# Patient Record
Sex: Male | Born: 1940 | Race: White | Hispanic: No | Marital: Married | Smoking: Former smoker
Health system: Southern US, Community
[De-identification: ages and names within clinical notes are randomized; demographics above are authoritative.]

## PROBLEM LIST (undated history)

## (undated) DIAGNOSIS — I10 Essential (primary) hypertension: Secondary | ICD-10-CM

## (undated) DIAGNOSIS — G459 Transient cerebral ischemic attack, unspecified: Secondary | ICD-10-CM

## (undated) DIAGNOSIS — I2510 Atherosclerotic heart disease of native coronary artery without angina pectoris: Secondary | ICD-10-CM

## (undated) DIAGNOSIS — E119 Type 2 diabetes mellitus without complications: Secondary | ICD-10-CM

## (undated) DIAGNOSIS — I350 Nonrheumatic aortic (valve) stenosis: Secondary | ICD-10-CM

## (undated) DIAGNOSIS — N2889 Other specified disorders of kidney and ureter: Secondary | ICD-10-CM

## (undated) DIAGNOSIS — I160 Hypertensive urgency: Secondary | ICD-10-CM

## (undated) DIAGNOSIS — G4730 Sleep apnea, unspecified: Secondary | ICD-10-CM

## (undated) DIAGNOSIS — I639 Cerebral infarction, unspecified: Secondary | ICD-10-CM

## (undated) DIAGNOSIS — R001 Bradycardia, unspecified: Secondary | ICD-10-CM

## (undated) DIAGNOSIS — F419 Anxiety disorder, unspecified: Secondary | ICD-10-CM

## (undated) DIAGNOSIS — R918 Other nonspecific abnormal finding of lung field: Secondary | ICD-10-CM

## (undated) DIAGNOSIS — I493 Ventricular premature depolarization: Secondary | ICD-10-CM

## (undated) DIAGNOSIS — C029 Malignant neoplasm of tongue, unspecified: Secondary | ICD-10-CM

## (undated) DIAGNOSIS — I5032 Chronic diastolic (congestive) heart failure: Secondary | ICD-10-CM

## (undated) DIAGNOSIS — E785 Hyperlipidemia, unspecified: Secondary | ICD-10-CM

## (undated) HISTORY — DX: Atherosclerotic heart disease of native coronary artery without angina pectoris: I25.10

## (undated) HISTORY — DX: Sleep apnea, unspecified: G47.30

## (undated) HISTORY — DX: Ventricular premature depolarization: I49.3

## (undated) HISTORY — DX: Essential (primary) hypertension: I10

## (undated) HISTORY — DX: Type 2 diabetes mellitus without complications: E11.9

## (undated) HISTORY — DX: Nonrheumatic aortic (valve) stenosis: I35.0

## (undated) HISTORY — DX: Hypertensive urgency: I16.0

## (undated) HISTORY — DX: Hyperlipidemia, unspecified: E78.5

## (undated) HISTORY — DX: Bradycardia, unspecified: R00.1

## (undated) HISTORY — PX: SQUAMOUS CELL CARCINOMA EXCISION: SHX2433

## (undated) HISTORY — DX: Malignant neoplasm of tongue, unspecified: C02.9

## (undated) HISTORY — DX: Cerebral infarction, unspecified: I63.9

## (undated) HISTORY — PX: CATARACT EXTRACTION, BILATERAL: SHX1313

## (undated) HISTORY — DX: Anxiety disorder, unspecified: F41.9

## (undated) NOTE — *Deleted (*Deleted)
PROGRESS NOTE    [place]  UJW:[id number] DOB: September 15, 1940 DOA: 12/17/2019 PCP: Gaspar Garbe, MD  Outpatient Specialists:     Brief Narrative:  ***   Assessment & Plan:   Active Problems:   Labile hypertension   Hyperlipidemia LDL goal <70   CAD (coronary artery disease)   Chronic diastolic CHF (congestive heart failure) (HCC)   Diabetes mellitus type 2 in nonobese (HCC)   Renal mass   S/P TAVR (transcatheter aortic valve replacement)   Chest pain with high risk for cardiac etiology   Chest pain at rest   ***   DVT prophylaxis: *** Code Status: *** Family Communication: *** Disposition Plan: ***   Consultants:   ***  Procedures:   ***  Antimicrobials:   ***    Subjective: ***  Objective: Vitals:   12/18/19 0900 12/18/19 0930 12/18/19 1000 12/18/19 1030  BP: (!) 144/62 138/62 (!) 129/59 130/60  Pulse: (!) 56 61 (!) 52 (!) 49  Resp: 16 (!) 9 14 15   Temp:      TempSrc:      SpO2: 97% 99% 99% 99%  Weight:      Height:        Intake/Output Summary (Last 24 hours) at 12/18/2019 1100 Last data filed at 12/18/2019 0042 Gross per 24 hour  Intake -  Output 2000 ml  Net -2000 ml   Filed Weights   12/17/19 1821  Weight: 78.7 kg    Examination:  General exam: Appears calm and comfortable  Respiratory system: Clear to auscultation. Respiratory effort normal. Cardiovascular system: S1 & S2 heard, RRR. No JVD, murmurs, rubs, gallops or clicks. No pedal edema. Gastrointestinal system: Abdomen is nondistended, soft and nontender. No organomegaly or masses felt. Normal bowel sounds heard. Central nervous system: Alert and oriented. No focal neurological deficits. Extremities: Symmetric 5 x 5 power. Skin: No rashes, lesions or ulcers Psychiatry: Judgement and insight appear normal. Mood & affect appropriate.     Data Reviewed: I have personally reviewed following labs and imaging studies  CBC: Recent Labs  Lab 12/17/19 1850  12/18/19 0014  WBC 7.7 8.6  HGB 14.4 14.6  HCT 44.0 43.6  MCV 81.6 80.6  PLT 142* 153   Basic Metabolic Panel: Recent Labs  Lab 12/17/19 1850 12/18/19 0014 12/18/19 0144  NA 129*  --  131*  K 3.6  --  3.5  CL 94*  --  96*  CO2 22  --  24  GLUCOSE 143*  --  214*  BUN 25*  --  20  CREATININE 1.24 1.15 1.16  CALCIUM 9.8  --  9.6   GFR: Estimated Creatinine Clearance: 54.2 mL/min (by C-G formula based on SCr of 1.16 mg/dL). Liver Function Tests: No results for input(s): AST, ALT, ALKPHOS, BILITOT, PROT, ALBUMIN in the last 168 hours. No results for input(s): LIPASE, AMYLASE in the last 168 hours. No results for input(s): AMMONIA in the last 168 hours. Coagulation Profile: No results for input(s): INR, PROTIME in the last 168 hours. Cardiac Enzymes: No results for input(s): CKTOTAL, CKMB, CKMBINDEX, TROPONINI in the last 168 hours. BNP (last 3 results) No results for input(s): PROBNP in the last 8760 hours. HbA1C: No results for input(s): HGBA1C in the last 72 hours. CBG: Recent Labs  Lab 12/18/19 0023 12/18/19 0354 12/18/19 0734 12/18/19 0821  GLUCAP 183* 191* 75 70   Lipid Profile: No results for input(s): CHOL, HDL, LDLCALC, TRIG, CHOLHDL, LDLDIRECT in the last 72 hours. Thyroid Function  Tests: No results for input(s): TSH, T4TOTAL, FREET4, T3FREE, THYROIDAB in the last 72 hours. Anemia Panel: No results for input(s): VITAMINB12, FOLATE, FERRITIN, TIBC, IRON, RETICCTPCT in the last 72 hours. Urine analysis:    Component Value Date/Time   COLORURINE AMBER (A) 10/20/2019 1113   APPEARANCEUR CLOUDY (A) 10/20/2019 1113   LABSPEC 1.010 10/20/2019 1113   PHURINE 6.0 10/20/2019 1113   GLUCOSEU NEGATIVE 10/20/2019 1113   HGBUR MODERATE (A) 10/20/2019 1113   BILIRUBINUR NEGATIVE 10/20/2019 1113   KETONESUR NEGATIVE 10/20/2019 1113   PROTEINUR 30 (A) 10/20/2019 1113   NITRITE POSITIVE (A) 10/20/2019 1113   LEUKOCYTESUR LARGE (A) 10/20/2019 1113   Sepsis Labs:  @LABRCNTIP (procalcitonin:4,lacticidven:4)  ) Recent Results (from the past 240 hour(s))  Respiratory Panel by RT PCR (Flu A&B, Covid) - Nasopharyngeal Swab     Status: None   Collection Time: 12/17/19 11:11 PM   Specimen: Nasopharyngeal Swab  Result Value Ref Range Status   SARS Coronavirus 2 by RT PCR NEGATIVE NEGATIVE Final    Comment: (NOTE) SARS-CoV-2 target nucleic acids are NOT DETECTED.  The SARS-CoV-2 RNA is generally detectable in upper respiratoy specimens during the acute phase of infection. The lowest concentration of SARS-CoV-2 viral copies this assay can detect is 131 copies/mL. A negative result does not preclude SARS-Cov-2 infection and should not be used as the sole basis for treatment or other patient management decisions. A negative result may occur with  improper specimen collection/handling, submission of specimen other than nasopharyngeal swab, presence of viral mutation(s) within the areas targeted by this assay, and inadequate number of viral copies (<131 copies/mL). A negative result must be combined with clinical observations, patient history, and epidemiological information. The expected result is Negative.  Fact Sheet for Patients:  https://www.moore.com/  Fact Sheet for Healthcare Providers:  https://www.young.biz/  This test is no t yet approved or cleared by the [place] FDA and  has been authorized for detection and/or diagnosis of SARS-CoV-2 by FDA under an Emergency Use Authorization (EUA). This EUA will remain  in effect (meaning this test can be used) for the duration of the COVID-19 declaration under Section 564(b)(1) of the Act, 21 U.S.C. section 360bbb-3(b)(1), unless the authorization is terminated or revoked sooner.     Influenza A by PCR NEGATIVE NEGATIVE Final   Influenza B by PCR NEGATIVE NEGATIVE Final    Comment: (NOTE) The Xpert Xpress SARS-CoV-2/FLU/RSV assay is intended as an aid in   the diagnosis of influenza from Nasopharyngeal swab specimens and  should not be used as a sole basis for treatment. Nasal washings and  aspirates are unacceptable for Xpert Xpress SARS-CoV-2/FLU/RSV  testing.  Fact Sheet for Patients: https://www.moore.com/  Fact Sheet for Healthcare Providers: https://www.young.biz/  This test is not yet approved or cleared by the [place] FDA and  has been authorized for detection and/or diagnosis of SARS-CoV-2 by  FDA under an Emergency Use Authorization (EUA). This EUA will remain  in effect (meaning this test can be used) for the duration of the  Covid-19 declaration under Section 564(b)(1) of the Act, 21  U.S.C. section 360bbb-3(b)(1), unless the authorization is  terminated or revoked. Performed at [facility], [address], [place], [place] [address]          Radiology Studies: DG Chest 2 View  Result Date: 12/17/2019 CLINICAL DATA:  Chest pain on the left EXAM: CHEST - 2 VIEW COMPARISON:  12/01/2019 FINDINGS: The heart size and mediastinal contours are within normal limits.  Both lungs are clear. The visualized skeletal structures are unremarkable. IMPRESSION: No active cardiopulmonary disease. Electronically Signed   By: Alcide Clever M.D.   On: 12/17/2019 20:00        Scheduled Meds: . aspirin  81 mg Oral Daily  . clonazePAM  0.5 mg Oral BID  . fenofibrate  160 mg Oral Daily  . finasteride  5 mg Oral Daily  . hydrALAZINE  25 mg Oral Q8H  . losartan  100 mg Oral Daily   And  . hydrochlorothiazide  25 mg Oral Daily  . insulin aspart  0-15 Units Subcutaneous Q4H  . isosorbide mononitrate  30 mg Oral Daily  . metoprolol tartrate  50 mg Oral BID  . ranolazine  500 mg Oral BID  . rosuvastatin  10 mg Oral Daily   Continuous Infusions:   LOS: 0 days    Time spent: ***    Berton Mount, MD  [facility] Pager #: [phone] 7PM-7AM contact night coverage  as above

---

## 1998-04-15 ENCOUNTER — Other Ambulatory Visit: Admission: RE | Admit: 1998-04-15 | Discharge: 1998-04-15 | Payer: Self-pay | Admitting: Dermatology

## 1999-12-04 ENCOUNTER — Ambulatory Visit (HOSPITAL_COMMUNITY): Admission: RE | Admit: 1999-12-04 | Discharge: 1999-12-04 | Payer: Self-pay | Admitting: Gastroenterology

## 2002-08-20 ENCOUNTER — Encounter: Payer: Self-pay | Admitting: Emergency Medicine

## 2002-08-20 ENCOUNTER — Inpatient Hospital Stay (HOSPITAL_COMMUNITY): Admission: EM | Admit: 2002-08-20 | Discharge: 2002-08-23 | Payer: Self-pay | Admitting: Emergency Medicine

## 2002-08-20 ENCOUNTER — Encounter: Payer: Self-pay | Admitting: Neurology

## 2002-08-21 ENCOUNTER — Encounter (INDEPENDENT_AMBULATORY_CARE_PROVIDER_SITE_OTHER): Payer: Self-pay | Admitting: Interventional Cardiology

## 2002-08-21 ENCOUNTER — Encounter: Payer: Self-pay | Admitting: Neurology

## 2003-04-17 ENCOUNTER — Ambulatory Visit (HOSPITAL_COMMUNITY): Admission: RE | Admit: 2003-04-17 | Discharge: 2003-04-17 | Payer: Self-pay | Admitting: Neurology

## 2004-02-14 ENCOUNTER — Ambulatory Visit (HOSPITAL_COMMUNITY): Admission: RE | Admit: 2004-02-14 | Discharge: 2004-02-14 | Payer: Self-pay | Admitting: Neurology

## 2008-04-26 ENCOUNTER — Encounter: Admission: RE | Admit: 2008-04-26 | Discharge: 2008-04-26 | Payer: Self-pay | Admitting: Neurology

## 2009-01-20 ENCOUNTER — Emergency Department (HOSPITAL_COMMUNITY): Admission: EM | Admit: 2009-01-20 | Discharge: 2009-01-20 | Payer: Self-pay | Admitting: Emergency Medicine

## 2009-10-07 ENCOUNTER — Ambulatory Visit: Payer: Self-pay | Admitting: Cardiovascular Disease

## 2009-11-11 ENCOUNTER — Ambulatory Visit: Payer: Self-pay | Admitting: Cardiovascular Disease

## 2010-02-07 ENCOUNTER — Ambulatory Visit: Payer: Self-pay | Admitting: Cardiovascular Disease

## 2010-06-04 LAB — POCT I-STAT, CHEM 8
BUN: 21 mg/dL (ref 6–23)
Calcium, Ion: 1.13 mmol/L (ref 1.12–1.32)
Chloride: 106 mEq/L (ref 96–112)
Creatinine, Ser: 1.1 mg/dL (ref 0.4–1.5)
Glucose, Bld: 139 mg/dL — ABNORMAL HIGH (ref 70–99)
HCT: 48 % (ref 39.0–52.0)
Hemoglobin: 16.3 g/dL (ref 13.0–17.0)
Potassium: 4.2 mEq/L (ref 3.5–5.1)
Sodium: 143 mEq/L (ref 135–145)
TCO2: 28 mmol/L (ref 0–100)

## 2010-06-25 ENCOUNTER — Encounter: Payer: Self-pay | Admitting: Cardiovascular Disease

## 2010-06-25 DIAGNOSIS — I493 Ventricular premature depolarization: Secondary | ICD-10-CM | POA: Insufficient documentation

## 2010-06-25 DIAGNOSIS — I10 Essential (primary) hypertension: Secondary | ICD-10-CM | POA: Insufficient documentation

## 2010-06-25 DIAGNOSIS — R001 Bradycardia, unspecified: Secondary | ICD-10-CM | POA: Insufficient documentation

## 2010-06-25 DIAGNOSIS — R0989 Other specified symptoms and signs involving the circulatory and respiratory systems: Secondary | ICD-10-CM | POA: Insufficient documentation

## 2010-06-25 DIAGNOSIS — F419 Anxiety disorder, unspecified: Secondary | ICD-10-CM | POA: Insufficient documentation

## 2010-06-25 DIAGNOSIS — E785 Hyperlipidemia, unspecified: Secondary | ICD-10-CM | POA: Insufficient documentation

## 2010-07-01 ENCOUNTER — Ambulatory Visit (INDEPENDENT_AMBULATORY_CARE_PROVIDER_SITE_OTHER): Payer: Medicare Other | Admitting: Cardiovascular Disease

## 2010-07-01 ENCOUNTER — Encounter: Payer: Self-pay | Admitting: Cardiovascular Disease

## 2010-07-01 VITALS — BP 160/90 | HR 64 | Wt 227.0 lb

## 2010-07-01 DIAGNOSIS — I10 Essential (primary) hypertension: Secondary | ICD-10-CM

## 2010-07-01 MED ORDER — NEBIVOLOL HCL 20 MG PO TABS: 40.0000 mg | ORAL_TABLET | Freq: Every day | ORAL | Status: DC

## 2010-07-01 NOTE — Assessment & Plan Note (Signed)
The blood pressure has been on him but it is a little bit elevated here. I like to increase his Bystolic  To 40 mg a day. He should stop taking the Toprol.  I'll see him back in the office in 3 months for followup visit.

## 2010-07-01 NOTE — Progress Notes (Signed)
Alexis Griffith Date of Birth  02-19-1941 [facility] [address]     [address] [place], [place]  [address] [phone]  Fax  [phone]  History of Present Illness:  Pt denies chest pain or dyspnea.  BP has been normal at home.  He still does not take his medications as prescribed. He takes Bystolic 10 mg a day and then takes her Toprol on an as-needed basis on top of that.  His blood pressure seemed be fairly well controlled. Occasionally has some palpitations.   Current Outpatient Prescriptions on File Prior to Visit  Medication Sig Dispense Refill  . buPROPion (WELLBUTRIN SR) 150 MG 12 hr tablet Take 150 mg by mouth daily.        . Choline Fenofibrate (TRILIPIX) 135 MG capsule Take 135 mg by mouth daily.        . clonazePAM (KLONOPIN) 0.5 MG tablet Take 0.5 mg by mouth 4 (four) times daily.        Marland Kitchen dipyridamole-aspirin (AGGRENOX) 25-200 MG per 12 hr capsule Take 1 capsule by mouth 2 (two) times daily.        Marland Kitchen doxazosin (CARDURA) 4 MG tablet Take 4 mg by mouth at bedtime.        Marland Kitchen glimepiride (AMARYL) 4 MG tablet Take 4 mg by mouth daily before breakfast.        . metoprolol (LOPRESSOR) 50 MG tablet Take 50 mg by mouth 2 (two) times daily.        . rosuvastatin (CRESTOR) 10 MG tablet Take 10 mg by mouth daily.          Allergies  Allergen Reactions  . Lisinopril   . Macrodantin   . Penicillins   . Sulfa Drugs Cross Reactors   . Tricor   . Zithromax (Azithromycin Dihydrate)     Past Medical History  Diagnosis Date  . PVC (premature ventricular contraction)   . Hypertension   . Hyperlipidemia   . Anxiety   . Sinus bradycardia on ECG     History reviewed. No pertinent past surgical history.  History  Smoking status  . Former Smoker  . Quit date: 06/25/1975  Smokeless tobacco  . Not on file    History  Alcohol Use No    History reviewed. No pertinent family history.  Reviw of Systems:  Reviewed in the HPI.  All  other systems are negative.  Physical Exam: BP 160/90  Pulse 64  Wt 227 lb (102.967 kg) The patient is alert and oriented x 3.  The mood and affect are normal.  The skin is warm and dry.  Color is normal.  The HEENT exam reveals that the sclera are nonicteric.  The mucous membranes are moist.  The carotids are 2+ without bruits.  There is no thyromegaly.  There is no JVD.  The lungs are clear.  The chest wall is non tender.  The heart exam reveals a regular rate with a normal S1 and S2.  There are no murmurs, gallops, or rubs.  The PMI is not displaced.   Abdominal exam reveals good bowel sounds.  There is no guarding or rebound.  There is no hepatosplenomegaly or tenderness.  There are no masses.  Exam of the legs reveal no clubbing, cyanosis, or edema.  The legs are without rashes.  The distal pulses are intact.  Cranial nerves II - XII are intact.  Motor and sensory functions are intact.  The gait is  normal.  Assessment / Plan:

## 2010-07-09 ENCOUNTER — Other Ambulatory Visit: Payer: Self-pay | Admitting: Neurology

## 2010-07-09 DIAGNOSIS — G459 Transient cerebral ischemic attack, unspecified: Secondary | ICD-10-CM

## 2010-07-09 DIAGNOSIS — I6529 Occlusion and stenosis of unspecified carotid artery: Secondary | ICD-10-CM

## 2010-07-14 ENCOUNTER — Ambulatory Visit
Admission: RE | Admit: 2010-07-14 | Discharge: 2010-07-14 | Disposition: A | Discharge status: Home / Self Care | Payer: Medicare Other | Source: Ambulatory Visit | Attending: Neurology | Admitting: Neurology

## 2010-07-14 DIAGNOSIS — G459 Transient cerebral ischemic attack, unspecified: Secondary | ICD-10-CM

## 2010-07-14 DIAGNOSIS — I6529 Occlusion and stenosis of unspecified carotid artery: Secondary | ICD-10-CM

## 2010-07-14 MED ORDER — IOHEXOL 350 MG/ML SOLN
100.0000 mL | Freq: Once | INTRAVENOUS | Status: AC | PRN
  Administered 2010-07-14: 100 mL via INTRAVENOUS

## 2010-07-18 NOTE — Discharge Summary (Signed)
[place]is [place], [place]is [place]                         ACCOUNT NO.:  [id number]   MEDICAL RECORD NO.:  [id number]                   PATIENT TYPE:  INP   LOCATION:  3015                                 FACILITY:  [facility]   PHYSICIAN:  Genene Churn. Love, M.D.                 DATE OF BIRTH:  Sep 24, 1940   DATE OF ADMISSION:  08/20/2002  DATE OF DISCHARGE:  08/23/2002                                 DISCHARGE SUMMARY   INTRODUCTION:  This is the first [facility] admission for this 70-  year-old, right-handed white married male admitted from the emergency room  for evaluation of gait disorder.   HISTORY OF PRESENT ILLNESS:  Alexis Griffith has a past history of hypertension  and borderline diabetes mellitus.  He came to the emergency room for  evaluation of intermittent episodes of gait disturbance with sensation of  falling to the right beginning 24 hours prior to admission.  At that time he  had used a push mower to mow his grass rather than the typical riding mower  that he would use.  When attempting to walk he noticed episodes in which he  would tend to veer to his right but would not fall.  This was not associated  with any spinning sensations, headache, double vision, or hiccups.  In the  morning he got out of bed, the same symptoms happened, and he came to the  emergency room.   PAST MEDICAL HISTORY:  Significant for:  1. Hypertension.  2. Gait disorder.  3. Borderline diabetes mellitus.  4. Hyperlipidemia with elevated triglycerides.  5. Benign prostatic hypertrophy.  6. Anxiety and depression.  7. Multiple reactions to medications.   SOCIAL HISTORY:  He did not smoke cigarettes or drink alcohol.   ALLERGIES:  PENICILLIN, SULFA DRUGS, and MACRODANTIN.   MEDICATIONS AT THE TIME OF ADMISSION:  1. Tricor 160 mg daily.  2. Lopressor 50 mg b.i.d.  3. Cardura 4 mg daily.  4. Wellbutrin SR 150 mg daily.  5. Klonopin 0.5 mg q.i.d.  6. Ecotrin 325 mg daily.   SOCIAL HISTORY:  The  patient was married.  He lived in [place]  He is  semiretired, has a Civil engineer, contracting.   PHYSICAL EXAMINATION:  GENERAL:  At the time of admission revealed a well-  developed, pleasant white male in no acute distress.  General examination  was unremarkable.  VITAL SIGNS:  Blood pressure 137/79, heart rate 66 and regular, respiratory  rate was 14, temperature afebrile.  NEUROLOGIC:  Unremarkable.   LABORATORY DATA:  CT scan of the brain was unremarkable.   EKG showed normal sinus rhythm with nonspecific ST and T-wave changes.  Heart rate was 65.   Chest x-ray showed no active disease.   Sodium 139, potassium 3.9, chloride 106, CO2 content 26, BUN 13.  Hematocrit  50, hemoglobin 17.  His repeat CBCs in the hospital revealed white  blood  cell counts in the 7300 range, hemoglobin 15.5, hematocrit 44.3.  While on  heparin PTTs were in the 137-200 range.  His glucose was high at 148, BUN  13, creatinine 1.  Cholesterol was 198, triglycerides 235, HDLs 34, LDLs  117, VLDLs of 47.  His homocystine level was 13.28 with upper limits of  normal at 13.90.   Telemetry showed normal sinus rhythm in the hospital.   MRI study of the brain showed no evidence of an acute stroke.  There was  evidence of an acute right vertebral occlusion in the distal portion of the  intracranial branch with proximal vertebral showing some disease raising the  question of dissection versus atherosclerosis as a cause for occlusion.  There was possibly some proximal stenosis of the left vertebral as it came  off the subclavian.  There was some stenosis of the right ICA and possibly  ulceration by MR angiogram.   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   His 2-D echocardiogram showed left ventricular ejection fraction of 55-65%.   HOSPITAL COURSE:  The patient was admitted with suspected TIAs, probably  posterior circulation, and when found to have evidence of the right  vertebral distal  occlusion, suspected to have had that as the cause.  It was  noted that this was a nondominant vertebral.  The dominant vertebral, the  left, did have some mild proximal disease noted.  There was also some right  ICA stenosis documented by extracranial MRA and Doppler study.  In the  hospital discussion with the patient regarding his triglycerides and LDLs  was made, and it was decided to consider the addition of Zetia, which he had  been on in the past, to his current regimen but to be careful because of the  consideration of combination medicines sometimes associated with gallstones.   ASSESSMENT:  1. Gait disorder, code 781.2.  2. Suspect transient ischemic attack, code 435.9.  3. 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  4. Hyperlipidemia and hypertriglyceridemia, code 272.4.  5. Diabetes mellitus, code 250.60.  6. Benign prostatic hypertrophy, code unknown.  7. Anxiety, code 300.00.   PLAN:  1. The plan at this time is to place the patient on Plavix 75 mg daily     without associated aspirin.  If  further symptoms arise, then consideration of neurologic intervention may be  appropriate.  1. Continue Wellbutrin 150 mg SR daily.  2. Klonopin 0.5 mg four times per day.  3. Cardura 4 mg at bedtime.  4. Tricor 160 mg daily.  5. Lopressor 50 mg morning and evening.  6. Zetia 10 mg tablets, 1/2 daily.  7. Plavix 75 mg daily.  8. Nexium 40 mg daily.   He is not to drive a car.  He is to be on a low-salt, low-cholesterol,  diabetic diet.  Return to see me in approximately three weeks for  reevaluation and to see Dr. Wylene Simmer.                                               Genene Churn. Sandria Manly, M.D.    JML/MEDQ  D:  08/23/2002  T:  08/24/2002  Job:  [id number]  cc:   Gaspar Garbe, M.D. [address]   [place]  [place] [address]  Fax: [phone]    cc:   Gaspar Garbe, M.D.  [address]  [place]  [place] [address]  Fax: [phone]

## 2010-07-18 NOTE — H&P (Signed)
NAME:  Alexis Griffith                         ACCOUNT NO.:  [id number]   MEDICAL RECORD NO.:  [id number]                   PATIENT TYPE:  EMS   LOCATION:  MAJO                                 FACILITY:  [facility]   PHYSICIAN:  Marlan Palau, M.D.               DATE OF BIRTH:  20-Jun-1940   DATE OF ADMISSION:  08/20/2002  DATE OF DISCHARGE:                                HISTORY & PHYSICAL   HISTORY OF PRESENT ILLNESS:  Alexis Griffith is  a 70 year old right-handed  white male born on November 10, 1940, with a history of  hypertension and  borderline diabetes. The patient comes to the [facility] emergency  room for evaluation  of intermittent  episodes of gait disturbance that  began yesterday after he mowed his grass. The patient noted that he would  attempt to walk and would tend to veer to the right but would not fall. The  patient  initially had an episode lasting about an hour and a half with full  clearing.  This morning, however, when the patient got up out of bed, the same thing  would happen. The patient noted that he would veer to the right while he was  trying to walk. The patient denied any numbness or weakness on the face,  arms, legs, slurred speech or visual field changes, but felt unusual in  the head. The patient again has not fallen. The patient has had 2 to 3  episodes of gait disturbance with full resolution. The patient comes to the  emergency room for an evaluation.   PAST MEDICAL HISTORY:  1. History of hypertension.  2. Gait disturbance as above.  3. Borderline diabetes.  4. Hypercholesteremia, hyperlipidemia.  5. Benign prostatic hypertrophy.  6. Anxiety and depression.   MEDICATIONS:  1. TriCor 160 mg daily.  2. Lopressor 150 mg b.i.d.  3. Cardura 4 mg daily.  4. Wellbutrin SR 150 mg daily.  5. Klonopin 0.5 mg q.i.d.  6. Ecotrin 325 mg daily.   ALLERGIES:  PENICILLIN, SULFA DRUGS AND MACRODANTIN.   HABITS:  He does not smoke or drink.   SOCIAL HISTORY:  The patient is married. He lives in the [place] area. He  has 2 children who are alive and well. The patient is in semi retirement.  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   FAMILY HISTORY:  His mother has died with a stroke. His father had MI and a  stroke. The patient has 2 brothers, 1 with coronary artery disease, status  post stent placement.   REVIEW OF SYSTEMS:  Notable for no fevers or chills. The patient denies  headache. Slight neck stiffness. Denies  shortness of breath, chest pain,  nausea or vomiting, problems controlling bowels or bladder. He denies any  blackout episodes or dizziness.   PHYSICAL EXAMINATION:  VITAL SIGNS:  Blood pressure 137/79, heart rate 66,  respirations 14, temperature  afebrile.  GENERAL:  This patient is a well developed white male who is alert and  cooperative at the time of examination.  HEENT:  Head is atraumatic. Pupils are equal, round and reactive to light.  Disks are flat bilaterally.  NECK:  No carotid bruits.  RESPIRATORY:  Examination  is clear.  CARDIOVASCULAR:  Regular rate and rhythm with no obvious murmurs or rubs  noted.  ABDOMEN:  Mildly obese abdomen, positive bowel sounds, no organomegaly or  tenderness noted.  EXTREMITIES:  Without significant edema.  NEUROLOGIC:  Cranial nerves, facial symmetry is present. The patient has  good sensation  in the face to pinprick and soft bilaterally. He has good  strength  in the facial muscles and muscles of the neck  and shoulder shrug  bilaterally. Speech is well enunciated and not aphasic. The patient has good  strength  in all 4s, good symmetric  motor tone noted throughout. Sensory  testing is intact to pinprick, soft touch and vibratory sensation  throughout. The patient has good finger-nose-finger and heel-to-shin. Gait  normal. Tandem gait normal. Romberg negative. No evidence of pronator drift  is seen. Deep tendon reflexes remained symmetric and  normal. Toes are  downgoing bilaterally.   LABORATORY DATA:  A CT scan of the head is unremarkable.  An EKG reveals  normal sinus rhythm with nonspecific  T-wave abnormalities. Heart rate is  65. A chest x-ray is pending.   Sodium 139, potassium 3.9, chloride 106, bicarbonate 26, BUN 13, hematocrit  50, hemoglobin 17.   IMPRESSION:  Intermittent gait disturbance, etiology unclear, rule out  transient ischemic attack event. The patient has had several episodes of  gait disturbance with a tendency to veer off to the right with walking. We  need to assume that these events represent TIA spells. Will admit for  further evaluation.   PLAN:  1. Admission to [facility]  2. Intravenous heparin.  3. MRI scan of the brain.  4. MRI angiogram.  5. 2D echocardiogram.  6. I will follow the patient's clinical course while in house.                                               Marlan Palau, M.D.    CKW/MEDQ  D:  08/20/2002  T:  08/21/2002  Job:  [id number]   cc:   Gwen Pounds, M.D.  [address]  [place]  [place] [address]  Fax: [phone]   [facility]    cc:   Gwen Pounds, M.D.  [address]  [place]  [place] [address]  Fax: [phone]   [facility]

## 2010-07-18 NOTE — Procedures (Signed)
[place]  Patient:    DESTON, BILYEU                      MRN: [id number] Proc. Date: 12/04/99 Adm. Date:  [id number] Attending:  Orland Mustard CC:         Jonelle Sports. Cheryll Cockayne, M.D.   Procedure Report  PROCEDURE:  Colonoscopy.  MEDICATIONS:  Fentanyl 150 mcg, Versed 15 mg IV.  INDICATIONS:  A nice gentleman with a previous history of adenomatous colon polyps.  This is done as a 3 year follow up.  DESCRIPTION OF PROCEDURE:  The procedure had been explained to the patient and consent obtained.  SCOPE:  Olympus adult video colonoscope.  With the patient in the left lateral decubitus, the Olympus adult video colonoscope was inserted and advanced under direct visualization.  The prep was quite good.  The patient had a long tortuous colon and it took some time to advance to the cecum.  Abdominal pressure and position changes were required.  The cecum was reached, ileocecal valve was seen and there was some sticky adherent stool in the cecum where a small polyp could have been missed but nothing of any significance.  The scope was withdrawn.  Cecum, ascending colon, hepatic flexure, transverse colon, splenic flexure, descending and sigmoid colon were seen well.  The scope was withdrawn.  The patient tolerated the procedure well.  ASSESSMENT:  No evidence of further polyps.  PLAN:  Repeat in 3 years. DD:  12/04/99 TD:  12/04/99 Job: [id number]

## 2010-08-05 ENCOUNTER — Encounter (INDEPENDENT_AMBULATORY_CARE_PROVIDER_SITE_OTHER): Payer: Medicare Other | Admitting: Vascular Surgery

## 2010-08-05 DIAGNOSIS — I6529 Occlusion and stenosis of unspecified carotid artery: Secondary | ICD-10-CM

## 2010-08-06 NOTE — Consult Note (Signed)
NEW PATIENT CONSULTATION  Alexis Griffith, Alexis Griffith DOB:  03-30-1940                                       08/05/2010 ZOXWR#:[id number]  Alexis Griffith presents today for evaluation of his  extracranial cerebrovascular occlusive disease.  He is a very pleasant 70 year old gentleman who had a history of stroke or TIA in June 2004.  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  He has had  longstanding ongoing follow-up of his extracranial cerebrovascular occlusive disease.  We are seeing him for further discussion of this. Aside from this one event in 2004, he has had no recurrent episodes of transient ischemic attacks, amaurosis fugax or stroke.  He does have a history of non-insulin dependent diabetes, hypertension, and elevated cholesterol.  SOCIAL HISTORY:  He is married and is retired.  He quit smoking 35 years ago.  He does not drink alcohol.  FAMILY HISTORY:  Positive for cardiac disease in his father and mother.  REVIEW OF SYSTEMS:  No weight loss or gain.  He weighs 225 pounds.  He is, 5 feet 10 inches tall. VASCULAR:  Positive a mini stroke. CARDIAC:  Positive for atrial fibrillation. GI:  Reflux, urinary frequency. ENT:  Change in eyesight. MUSCULOSKELETAL:  Arthritis, joint pain and muscle pain. PSYCHIATRIC:  Anxiety and depression.  PHYSICAL EXAMINATION:  Well-developed, well-nourished white male in no acute distress. His blood pressure is 134/69 in the right arm, 134/77 in the left arm, heart rate 64, respirations 18. HEENT:  Normal. CHEST:  Clear bilaterally. HEART:  Regular rate and rhythm.  He has  2+ carotid pulses.  I do not hear any carotid bruits.  His radial pulses are 2+. He is grossly intact neurologically with no focal weakness or paresthesias. MUSCULOSKELETAL:  Shows no major deformities or cyanosis.  I reviewed his carotid duplex from [facility] and also reviewed his specific images on a CT angiogram  obtained on Jul 14, 2010.  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  There is much less severe stenosis in the left carotid system.  I discussed this at length with Alexis Griffith and his wife present.  I would not recommend any intervention based on his current studies unless he develops any acute symptoms.  I have recommended carotid duplex at a 102-month interval.  I  explained to him I am completely comfortable this being done at [facility].  He has specifically requested that we do his duplex feeling that if he needs treatment that this would be the place that it would be received.  We are comfortable with this or at [facility] and I have scheduled him for a 21-month duplex follow- up in our lab.    Larina Earthly, M.D. Electronically Signed  TFE/MEDQ  D:  08/05/2010  T:  08/06/2010  Job:  [id number]  cc:   Alexis Griffith, M.D.

## 2010-10-02 ENCOUNTER — Encounter: Payer: Self-pay | Admitting: Cardiovascular Disease

## 2010-10-02 ENCOUNTER — Ambulatory Visit (INDEPENDENT_AMBULATORY_CARE_PROVIDER_SITE_OTHER): Payer: Medicare Other | Admitting: Cardiovascular Disease

## 2010-10-02 DIAGNOSIS — E785 Hyperlipidemia, unspecified: Secondary | ICD-10-CM

## 2010-10-02 DIAGNOSIS — I10 Essential (primary) hypertension: Secondary | ICD-10-CM

## 2010-10-02 NOTE — Assessment & Plan Note (Addendum)
His blood pressure been well controlled on the higher dose of Bystolic. He seems to be tolerating it fairly well. We'll continue with his current medications.

## 2010-10-02 NOTE — Progress Notes (Signed)
Alexis Griffith Date of Birth  1940/08/08 [facility] [address]     [address] [place], [place]  [address] [phone]  Fax  [phone]  History of Present Illness:  Alexis Griffith is a 70 year old gentleman. He has a history of premature ventricular contractions. He has a history of hypertension and hyperlipidemia. He also has had some anxiety in the past.  He's not had any recent episodes of chest pain or shortness of breath.  He occasionally has some palpitations and takes a half a dose of Lopressor which seems to solve the  Problem.  He  Is not getting as much exercise as he would like.  I saw him 3 months ago. His blood pressure was elevated. We increased his postoperative 40 mg a day. He seems to be doing quite well with that.    He has been under lots stress this week.  Current Outpatient Prescriptions on File Prior to Visit  Medication Sig Dispense Refill  . buPROPion (WELLBUTRIN SR) 150 MG 12 hr tablet Take 150 mg by mouth daily.        . Choline Fenofibrate (TRILIPIX) 135 MG capsule Take 135 mg by mouth daily.        . clonazePAM (KLONOPIN) 0.5 MG tablet Take 0.5 mg by mouth 4 (four) times daily.        Marland Kitchen dipyridamole-aspirin (AGGRENOX) 25-200 MG per 12 hr capsule Take 1 capsule by mouth 2 (two) times daily.        Marland Kitchen doxazosin (CARDURA) 4 MG tablet Take 4 mg by mouth at bedtime.        Marland Kitchen glimepiride (AMARYL) 4 MG tablet Take 4 mg by mouth daily before breakfast.        . metFORMIN (GLUCOPHAGE) 500 MG tablet Take 500 mg by mouth 2 (two) times daily with a meal.        . Nebivolol HCl (BYSTOLIC) 20 MG TABS Take 2 tablets (40 mg total) by mouth daily.  60 tablet  12  . rosuvastatin (CRESTOR) 10 MG tablet Take 10 mg by mouth daily.          Allergies  Allergen Reactions  . Lisinopril   . Macrodantin   . Penicillins   . Sulfa Drugs Cross Reactors   . Tricor   . Zithromax (Azithromycin Dihydrate)     Past Medical History  Diagnosis Date   . PVC (premature ventricular contraction)   . Hypertension   . Hyperlipidemia   . Anxiety   . Sinus bradycardia on ECG     No past surgical history on file.  History  Smoking status  . Former Smoker  . Quit date: 06/25/1975  Smokeless tobacco  . Not on file    History  Alcohol Use No    No family history on file.  Reviw of Systems:  Reviewed in the HPI.  All other systems are negative.  Physical Exam: BP 130/68  Pulse 56  Ht 5\' 10"  (1.778 m)  Wt 227 lb (102.967 kg)  BMI 32.57 kg/m2 The patient is alert and oriented x 3.  The mood and affect are normal.   Skin: warm and dry.  Color is normal.    HEENT:   the sclera are nonicteric.  The mucous membranes are moist.  The carotids are 2+ without bruits.  There is no thyromegaly.  There is no JVD.    Lungs: clear.  The chest wall is non tender.    Heart:  regular rate with a normal S1 and S2.  There are no murmurs, gallops, or rubs. The PMI is not displaced.     Abdomen: good bowel sounds.  There is no guarding or rebound.  There is no hepatosplenomegaly or tenderness.  There are no masses.   Extremities:  no clubbing, cyanosis, or edema.  The legs are without rashes.  The distal pulses are intact.   Neuro:  Cranial nerves II - XII are intact.  Motor and sensory functions are intact.    The gait is normal.  ECG:  Assessment / Plan:

## 2010-10-02 NOTE — Assessment & Plan Note (Signed)
Check a lipid profile to see him again in 6 months.

## 2010-10-14 ENCOUNTER — Other Ambulatory Visit: Payer: Self-pay | Admitting: *Deleted

## 2010-10-14 MED ORDER — METOPROLOL TARTRATE 50 MG PO TABS: 50.0000 mg | ORAL_TABLET | Freq: Two times a day (BID) | ORAL | Status: DC

## 2010-10-24 ENCOUNTER — Other Ambulatory Visit: Payer: Self-pay | Admitting: Otolaryngology

## 2010-10-24 ENCOUNTER — Telehealth: Payer: Self-pay | Admitting: Cardiovascular Disease

## 2010-10-24 DIAGNOSIS — C023 Malignant neoplasm of anterior two-thirds of tongue, part unspecified: Secondary | ICD-10-CM

## 2010-10-24 NOTE — Telephone Encounter (Signed)
Called Mandy Dr Clovis Pu nurse, msg left to call back with procedure and date.

## 2010-10-24 NOTE — Telephone Encounter (Signed)
Alexis Griffith calling in regards to a medical clearance for surgery.  Please call back.  Chart in box.

## 2010-10-28 ENCOUNTER — Ambulatory Visit
Admission: RE | Admit: 2010-10-28 | Discharge: 2010-10-28 | Disposition: A | Discharge status: Home / Self Care | Payer: Medicare Other | Source: Ambulatory Visit | Attending: Otolaryngology | Admitting: Otolaryngology

## 2010-10-28 DIAGNOSIS — C023 Malignant neoplasm of anterior two-thirds of tongue, part unspecified: Secondary | ICD-10-CM

## 2010-10-28 MED ORDER — IOHEXOL 300 MG/ML  SOLN
75.0000 mL | Freq: Once | INTRAMUSCULAR | Status: AC | PRN
  Administered 2010-10-28: 75 mL via INTRAVENOUS

## 2010-10-28 NOTE — Telephone Encounter (Signed)
No date set yet, pending clearance. Pt having general anesthesia for tumor removal in cervical area with skin grafting. Chart on dr Ian Bushman desk.

## 2010-10-28 NOTE — Telephone Encounter (Signed)
msg again, need date and procedure.

## 2010-10-29 ENCOUNTER — Telehealth: Payer: Self-pay | Admitting: Cardiovascular Disease

## 2010-10-29 NOTE — Telephone Encounter (Signed)
Pt c/o htn 170/80 160/75 and is taking an extra bystolic 20mg  tab, pulse is 50. Pt upset has new dx of tongue cancer. Dr Ian Bushman said no additional bystolic to be taken, pt to walk, or do other activity to calm im through this trying time, if bp continues to be elevated dr may rx lisinopril but pt is to continue to monitor.Pt verbalized understanding. Alfonso Ramus RN

## 2010-10-29 NOTE — Telephone Encounter (Signed)
Looking for Chart

## 2010-10-29 NOTE — Telephone Encounter (Signed)
Pt has questions about changing dosage on blood pressure medicine for Bystolic.  Please call pt back.

## 2010-11-01 DIAGNOSIS — C029 Malignant neoplasm of tongue, unspecified: Secondary | ICD-10-CM

## 2010-11-01 HISTORY — DX: Malignant neoplasm of tongue, unspecified: C02.9

## 2010-11-06 ENCOUNTER — Telehealth: Payer: Self-pay | Admitting: *Deleted

## 2010-11-06 NOTE — Telephone Encounter (Signed)
This note was forwarded to [facility] for surgical procedure. Oct 31 2010

## 2010-11-06 NOTE — Telephone Encounter (Signed)
Pt called to insure he had information,

## 2010-11-06 NOTE — Telephone Encounter (Signed)
Called msg to [place] to call back

## 2010-11-06 NOTE — Telephone Encounter (Signed)
Per Angelica Chessman returning call back to nurse.

## 2010-11-06 NOTE — Telephone Encounter (Signed)
Message copied by Antony Odea on Thu Nov 06, 2010  2:09 PM ------      Message from: Vesta Mixer      Created: Fri Oct 31, 2010  3:02 PM      Regarding: RE: card clear,       Mr. Lathen Seal is at low risk for his upcoming oral surgery.                  Kristeen Miss, MD      October 31, 2010                  ----- Message -----         From: Sable Feil, RN         Sent: 10/31/2010   9:02 AM           To: Elyn Aquas., MD      Subject: card clear,                                              Need cardiac clearance, chart remains on desk, any chance you can sign? His surgery isn't being scheduled till you clear him. Let me know, jodette

## 2010-11-06 NOTE — Telephone Encounter (Signed)
Call msg to many

## 2010-11-06 NOTE — Telephone Encounter (Signed)
Mandy calling back to speak with the nurse. Receive the medical clearance surgery date is 9/14 @ 9 a.m.

## 2010-11-11 ENCOUNTER — Ambulatory Visit (HOSPITAL_COMMUNITY)
Admission: RE | Admit: 2010-11-11 | Discharge: 2010-11-11 | Disposition: A | Discharge status: Home / Self Care | Payer: Medicare Other | Source: Ambulatory Visit | Attending: Otolaryngology | Admitting: Otolaryngology

## 2010-11-11 ENCOUNTER — Encounter (HOSPITAL_COMMUNITY)
Admission: RE | Admit: 2010-11-11 | Discharge: 2010-11-11 | Disposition: A | Discharge status: Home / Self Care | Payer: Medicare Other | Source: Ambulatory Visit | Attending: Otolaryngology | Admitting: Otolaryngology

## 2010-11-11 ENCOUNTER — Other Ambulatory Visit (HOSPITAL_COMMUNITY): Payer: Self-pay | Admitting: Otolaryngology

## 2010-11-11 DIAGNOSIS — Z01818 Encounter for other preprocedural examination: Secondary | ICD-10-CM | POA: Insufficient documentation

## 2010-11-11 DIAGNOSIS — K148 Other diseases of tongue: Secondary | ICD-10-CM

## 2010-11-11 DIAGNOSIS — R220 Localized swelling, mass and lump, head: Secondary | ICD-10-CM | POA: Insufficient documentation

## 2010-11-11 DIAGNOSIS — Z01812 Encounter for preprocedural laboratory examination: Secondary | ICD-10-CM | POA: Insufficient documentation

## 2010-11-11 DIAGNOSIS — R221 Localized swelling, mass and lump, neck: Secondary | ICD-10-CM | POA: Insufficient documentation

## 2010-11-11 LAB — CBC
HCT: 45.1 % (ref 39.0–52.0)
Hemoglobin: 16.1 g/dL (ref 13.0–17.0)
MCH: 30.6 pg (ref 26.0–34.0)
MCHC: 35.7 g/dL (ref 30.0–36.0)
MCV: 85.7 fL (ref 78.0–100.0)
Platelets: 156 10*3/uL (ref 150–400)
RBC: 5.26 MIL/uL (ref 4.22–5.81)
RDW: 12.8 % (ref 11.5–15.5)
WBC: 8.5 10*3/uL (ref 4.0–10.5)

## 2010-11-11 LAB — BASIC METABOLIC PANEL
BUN: 15 mg/dL (ref 6–23)
CO2: 26 mEq/L (ref 19–32)
Calcium: 9.9 mg/dL (ref 8.4–10.5)
Chloride: 103 mEq/L (ref 96–112)
Creatinine, Ser: 1.16 mg/dL (ref 0.50–1.35)
GFR calc Af Amer: >60 mL/min (ref >60)
GFR calc non Af Amer: >60 mL/min (ref >60)
Glucose, Bld: 116 mg/dL — ABNORMAL HIGH (ref 70–99)
Potassium: 3.9 mEq/L (ref 3.5–5.1)
Sodium: 141 mEq/L (ref 135–145)

## 2010-11-11 LAB — SURGICAL PCR SCREEN
MRSA, PCR: NEGATIVE
Staphylococcus aureus: NEGATIVE

## 2010-11-14 ENCOUNTER — Ambulatory Visit (HOSPITAL_COMMUNITY)
Admission: RE | Admit: 2010-11-14 | Discharge: 2010-11-14 | Disposition: A | Discharge status: Home / Self Care | Payer: Medicare Other | Source: Ambulatory Visit | Attending: Otolaryngology | Admitting: Otolaryngology

## 2010-11-14 ENCOUNTER — Other Ambulatory Visit: Payer: Self-pay | Admitting: Otolaryngology

## 2010-11-14 DIAGNOSIS — I10 Essential (primary) hypertension: Secondary | ICD-10-CM | POA: Insufficient documentation

## 2010-11-14 DIAGNOSIS — C023 Malignant neoplasm of anterior two-thirds of tongue, part unspecified: Secondary | ICD-10-CM | POA: Insufficient documentation

## 2010-11-14 DIAGNOSIS — K219 Gastro-esophageal reflux disease without esophagitis: Secondary | ICD-10-CM | POA: Insufficient documentation

## 2010-11-14 DIAGNOSIS — Z8673 Personal history of transient ischemic attack (TIA), and cerebral infarction without residual deficits: Secondary | ICD-10-CM | POA: Insufficient documentation

## 2010-11-14 DIAGNOSIS — E119 Type 2 diabetes mellitus without complications: Secondary | ICD-10-CM | POA: Insufficient documentation

## 2010-11-14 DIAGNOSIS — E669 Obesity, unspecified: Secondary | ICD-10-CM | POA: Insufficient documentation

## 2010-11-14 DIAGNOSIS — Z01818 Encounter for other preprocedural examination: Secondary | ICD-10-CM | POA: Insufficient documentation

## 2010-11-14 DIAGNOSIS — G4733 Obstructive sleep apnea (adult) (pediatric): Secondary | ICD-10-CM | POA: Insufficient documentation

## 2010-11-14 DIAGNOSIS — Z01812 Encounter for preprocedural laboratory examination: Secondary | ICD-10-CM | POA: Insufficient documentation

## 2010-11-14 LAB — GLUCOSE, CAPILLARY: Glucose-Capillary: 139 mg/dL — ABNORMAL HIGH (ref 70–99)

## 2010-11-17 ENCOUNTER — Telehealth: Payer: Self-pay | Admitting: *Deleted

## 2010-11-17 NOTE — Telephone Encounter (Signed)
Pt had surgery, at home and doing ok, eating jello only due to pain.

## 2010-11-21 NOTE — Op Note (Signed)
NAMEDAIWIK, BUFFALO                ACCOUNT NO.:  [id number]  MEDICAL RECORD NO.:  [id number]  LOCATION:  [facility]                         FACILITY:  [facility]  PHYSICIAN:  Kinnie Scales. 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  SURGICAL PROCEDURE:  Excision and reconstruction of left lateral tongue lesion (1 x 3 cm).  SURGEON:  Kinnie Scales. Annalee Genta, MD  ANESTHESIA:  General endotracheal.  COMPLICATIONS:  None.  ESTIMATED BLOOD LOSS:  Minimal.  The patient transferred from the operating room to the recovery room in stable condition.  BRIEF HISTORY:  The patient is a 70 year old white male followed by his dentist, Dr. Margaretha Seeds with a history of a left lateral tongue lesion. The patient had been followed closely with an asymptomatic leukoplakic lesion biopsied in 2010.  The area was found to be benign. Approximately 18 months later, the patient developed an ulcerated lesion in the central aspect of his previous leukoplakia.  Rebiopsy showed invasive squamous cell carcinoma.  The patient was referred to our office for additional evaluation, workup, and treatment.  The patient was found to have a 0.5-cm ulcerated lesion affixed to the underlying soft tissue with an area of leukoplakia surrounding it.  There was no bleeding.  The mass was moderately tender.  No palpable lymphadenopathy or mass.  A CT scan of the mouth and neck was performed.  There was no evidence of adenopathy or other abnormality and given the patient's history, examination, and physical finding, I recommended wide local excision of the lesion with primary reconstruction.  The patient  had a significant cardiac history and was on anticoagulant, Aggrenox for TIAs prior to the surgical procedure.  His medical physician, neurologist and cardiologist were consulted and the patient was weaned off Aggrenox 5 days prior to his procedure to reduce the risk of intraoperative bleeding.  The risks, benefits, and possible complications of the procedure were discussed in detail with the patient and his wife.  They understood and concurred with our plan for surgery which is scheduled as an outpatient under general anesthesia.  PROCEDURE:  The patient was brought to the operating room on November 14, 2010, placed in supine position on the operating table.  General endotracheal anesthesia was established without difficulty.  When the patient was adequately anesthetized, she was positioned on the operating table and prepped and draped in sterile fashion.  The left lateral tongue was then injected with a total of 2 mL of 1% lidocaine with 1:100,000 solution epinephrine injected in the subcutaneous fashion along the proposed incision site.  After allowing adequate time for vasoconstriction hemostasis, the patient was positioned on the operating table and prepped and draped.  A self-retracting oral mouthgag was placed without difficulty, allowing excellent access to the anterior aspect of the oral cavity and tongue.  Soft tissue and mucosal membranes were thoroughly inspected.  There was  no other significant abnormality noted with the exception of a 1 x 2-cm area of leukoplakia with a small central lesion on the left lateral tongue.  Using Bovie electrocautery, outlines of the proposed incision were then marked on the patient's left lateral tongue leaving a wide cuff of normal tissue surrounding the area of leukoplakia with the ulcerated tumor at the center.  Dissection was then carried through the mucosa and underlying deep submucosa.  A submucosal dissection plane was then established  along the lateral tongue, resecting a 1 x 3-cm portion of tongue tissue including the area of concern.  Her small areas of bleeding cauterized with monopolar cautery and a small venule was ligated with 3-0 silk suture ligature. The wound was then closed in a horizontal mattress fashion with 3-0 Vicryl suture on a tapered needle in an interrupted fashion.  Excellent closure.  No active bleeding.  The oral cavity and oropharynx were then irrigated and suctioned.  Orogastric tube was passed.  Stomach contents were aspirated.  The patient was then awakened from his anesthetic, extubated, and transferred from the operating room to the recovery room in stable condition.  No complications and blood loss minimal.          ______________________________ Kinnie Scales. Annalee Genta, M.D.     DLS/MEDQ  D:  16/12/9602  T:  11/14/2010  Job:  [id number]  cc:   Vesta Mixer, M.D. Evie Lacks, MD  Electronically Signed by Osborn Coho M.D. on 11/21/2010 12:31:49 PM

## 2010-12-22 ENCOUNTER — Telehealth: Payer: Self-pay | Admitting: Cardiovascular Disease

## 2010-12-22 NOTE — Telephone Encounter (Signed)
Pt called. Pt is looking for an urologist and wants to see if Dr Elease Hashimoto would recommend a Dr.

## 2010-12-23 NOTE — Telephone Encounter (Signed)
Gave dr Harvie Bridge recommendations of dr Lauralyn Primes group, #, Also, Dr Sandria Manly is tending to his carotid blockage noted on xray earlier.

## 2011-01-27 ENCOUNTER — Telehealth: Payer: Self-pay | Admitting: Cardiovascular Disease

## 2011-01-27 NOTE — Telephone Encounter (Signed)
Pt needs to discuss his b/p medication because he has been taking 3 pills instead of 2 due to b/p being a little elevated

## 2011-01-27 NOTE — Telephone Encounter (Signed)
Patient states his B/P Has being going up 160 to 180 systolic, he has been take Bystolic 3/ 20 mg tablets instead of 2 tablets daily. Patient has an order to take Lopressor 50 mg twice a day, but he only take one tablet as needed for  PVC's. He would like for Dr. Elease Hashimoto to know and see if he needs to order  the medication that is good to treat the high systolic, because his diastolic pressure is normal.

## 2011-01-28 NOTE — Telephone Encounter (Signed)
Fu call °Pt returning your call  °

## 2011-01-29 ENCOUNTER — Telehealth: Payer: Self-pay | Admitting: *Deleted

## 2011-01-29 NOTE — Telephone Encounter (Signed)
Pt told not to take extra bystolic, max dose is 40 mg a day, pt verbalized understanding.  dr Elease Hashimoto wants him to be seen to adjust bp meds, app made with Norma Fredrickson NP

## 2011-01-29 NOTE — Telephone Encounter (Signed)
Max dose of bystolic is 40 mg daily.  Current outpatient prescriptions :buPROPion (WELLBUTRIN SR) 150 MG 12 hr tablet.  , Choline Fenofibrate (TRILIPIX) 135 MG capsule, Take 135 mg by mouth daily.  ,  clonazePAM (KLONOPIN) 0.5 MG tablet, Take 0.5 mg by mouth 4 (four) times daily.    dipyridamole-aspirin (AGGRENOX) 25-200 MG per 12 hr capsule, Take 1 capsule by mouth 2 (two) times daily.  ,  doxazosin (CARDURA) 4 MG tablet, Take 4 mg by mouth at bedtime.   (AMARYL) 4 MG tablet, Take 4 mg by mouth daily before breakfast.   metFORMIN (GLUCOPHAGE) 500 mg by mouth 2 (two) times daily with a meal.  metoprolol (LOPRESSOR) 50 MG tablet, Take 1 tablet (50 mg total) by mouth 2 (two) times daily. Nebivolol HCl (BYSTOLIC) 20 MG TABS, Take 2 tablets (40 mg total) by mouth daily., Disp: 60 tablet, Rfl: 12;  rosuvastatin (CRESTOR) 10 MG tablet, Take 10 mg by mouth daily.     Add HCTZ 25 mg daily and KCl 10 meq daily.

## 2011-02-02 ENCOUNTER — Encounter: Payer: Self-pay | Admitting: Nurse Practitioner

## 2011-02-02 ENCOUNTER — Ambulatory Visit (INDEPENDENT_AMBULATORY_CARE_PROVIDER_SITE_OTHER): Payer: Medicare Other | Admitting: Nurse Practitioner

## 2011-02-02 VITALS — BP 143/68 | HR 70 | Ht 70.0 in | Wt 218.0 lb

## 2011-02-02 DIAGNOSIS — I350 Nonrheumatic aortic (valve) stenosis: Secondary | ICD-10-CM | POA: Insufficient documentation

## 2011-02-02 DIAGNOSIS — I4949 Other premature depolarization: Secondary | ICD-10-CM

## 2011-02-02 DIAGNOSIS — I10 Essential (primary) hypertension: Secondary | ICD-10-CM

## 2011-02-02 DIAGNOSIS — I359 Nonrheumatic aortic valve disorder, unspecified: Secondary | ICD-10-CM

## 2011-02-02 DIAGNOSIS — I493 Ventricular premature depolarization: Secondary | ICD-10-CM

## 2011-02-02 LAB — BASIC METABOLIC PANEL
BUN: 16 mg/dL (ref 6–23)
CO2: 27 mEq/L (ref 19–32)
Calcium: 9.8 mg/dL (ref 8.4–10.5)
Chloride: 106 mEq/L (ref 96–112)
Creatinine, Ser: 1.2 mg/dL (ref 0.4–1.5)
GFR: 66.82 mL/min (ref >60.00)
Glucose, Bld: 168 mg/dL — ABNORMAL HIGH (ref 70–99)
Potassium: 3.5 mEq/L (ref 3.5–5.1)
Sodium: 142 mEq/L (ref 135–145)

## 2011-02-02 MED ORDER — OLMESARTAN MEDOXOMIL 40 MG PO TABS: 40.0000 mg | ORAL_TABLET | Freq: Every day | ORAL | Status: DC

## 2011-02-02 NOTE — Assessment & Plan Note (Signed)
I have given him samples of Benicar 40 mg to try one a day. He will continue to monitor his blood pressure at home. I will see him back in 2 weeks. We will check BMET today and on return. Patient is agreeable to this plan and will call if any problems develop in the interim.

## 2011-02-02 NOTE — Patient Instructions (Signed)
We are going to add Benicar 40 mg daily for your blood pressure.  Take your 2nd of Bystolic closer to bedtime  We are going to check some lab today and when you come back.  Monitor your blood pressure at home. Record some readings and bring in for me to see.  I will see you in 2 weeks.  Call the [facility] office at [phone] if you have any questions, problems or concerns.

## 2011-02-02 NOTE — Progress Notes (Signed)
Alexis Griffith Date of Birth: 1940-08-30 Medical Record #[id number]  History of Present Illness: [name] is seen today for a work in visit. He is seen for Dr. Elease Hashimoto. He has a history of HTN and PVC's. He had been taking more Bystolic - up to 60 mg per day. When he called in, he was told that this was beyond the recommended dose. Blood pressure has been up at home. He is having more PVC's, especially at night. He takes his 2nd dose of Bystolic around 5pm2. No chest pain. No shortness of breath. HCTZ and Potassium were added according to the phone note but he never received.   Current Outpatient Prescriptions on File Prior to Visit  Medication Sig Dispense Refill  . buPROPion (WELLBUTRIN SR) 150 MG 12 hr tablet Take 150 mg by mouth daily.        . Choline Fenofibrate (TRILIPIX) 135 MG capsule Take 135 mg by mouth daily.        . clonazePAM (KLONOPIN) 0.5 MG tablet Take 0.5 mg by mouth 4 (four) times daily.        Marland Kitchen dipyridamole-aspirin (AGGRENOX) 25-200 MG per 12 hr capsule Take 1 capsule by mouth 2 (two) times daily.        Marland Kitchen doxazosin (CARDURA) 4 MG tablet Take 4 mg by mouth at bedtime.        Marland Kitchen glimepiride (AMARYL) 4 MG tablet Take 4 mg by mouth daily before breakfast.        . metFORMIN (GLUCOPHAGE) 500 MG tablet Take 500 mg by mouth 2 (two) times daily with a meal.        . Nebivolol HCl (BYSTOLIC) 20 MG TABS Take 2 tablets (40 mg total) by mouth daily.  60 tablet  12  . rosuvastatin (CRESTOR) 10 MG tablet Take 10 mg by mouth daily.        Marland Kitchen DISCONTD: metoprolol (LOPRESSOR) 50 MG tablet Take 1 tablet (50 mg total) by mouth 2 (two) times daily.  60 tablet  11    Allergies  Allergen Reactions  . Lisinopril     Cough   . Macrodantin   . Penicillins   . Sulfa Drugs Cross Reactors   . Tricor   . Zithromax (Azithromycin Dihydrate)     Past Medical History  Diagnosis Date  . PVC (premature ventricular contraction)   . Hypertension   . Hyperlipidemia   . Anxiety   . Sinus  bradycardia on ECG   . 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 Annalee Genta.    History reviewed. No pertinent past surgical history.  History  Smoking status  . Former Smoker  . Quit date: 06/25/1975  Smokeless tobacco  . Not on file    History  Alcohol Use No    Family History  Problem Relation Age of Onset  . Heart disease Mother   . Heart attack Mother   . Heart disease Father   . Heart attack Father     Review of Systems: The review of systems is per the HPI.  All other systems were reviewed and are negative.  Physical Exam: BP 143/68  Pulse 70  Ht 5\' 10"  (1.778 m)  Wt 218 lb (98.884 kg)  BMI 31.28 kg/m2 Patient is very pleasant and in no acute distress. He is overweight. Skin is warm and dry. Color is normal.  HEENT is unremarkable. Normocephalic/atraumatic. PERRL. Sclera are nonicteric. Neck is supple. No masses. No JVD. Lungs  are clear. Cardiac exam shows a regular rate and rhythm. He has a soft murmur noted.  Abdomen is obese but soft. Extremities are without edema. Gait and ROM are intact. No gross neurologic deficits noted.   LABORATORY DATA:   Assessment / Plan:

## 2011-02-02 NOTE — Assessment & Plan Note (Signed)
He uses Lopressor prn. He is willing to try taking his second dose of Bystolic closer to bedtime.

## 2011-02-02 NOTE — Assessment & Plan Note (Signed)
Last echo was in 2010. May need to consider repeat in the future.

## 2011-02-10 ENCOUNTER — Other Ambulatory Visit (INDEPENDENT_AMBULATORY_CARE_PROVIDER_SITE_OTHER): Payer: Medicare Other | Admitting: *Deleted

## 2011-02-10 DIAGNOSIS — I6529 Occlusion and stenosis of unspecified carotid artery: Secondary | ICD-10-CM

## 2011-02-17 ENCOUNTER — Other Ambulatory Visit: Payer: Medicare Other | Admitting: *Deleted

## 2011-02-17 ENCOUNTER — Ambulatory Visit: Payer: Medicare Other | Admitting: Nurse Practitioner

## 2011-02-19 ENCOUNTER — Encounter: Payer: Self-pay | Admitting: *Deleted

## 2011-02-19 ENCOUNTER — Ambulatory Visit (INDEPENDENT_AMBULATORY_CARE_PROVIDER_SITE_OTHER): Payer: Medicare Other

## 2011-02-19 DIAGNOSIS — H9209 Otalgia, unspecified ear: Secondary | ICD-10-CM

## 2011-02-19 DIAGNOSIS — J019 Acute sinusitis, unspecified: Secondary | ICD-10-CM

## 2011-02-19 DIAGNOSIS — H6120 Impacted cerumen, unspecified ear: Secondary | ICD-10-CM

## 2011-03-04 ENCOUNTER — Ambulatory Visit (INDEPENDENT_AMBULATORY_CARE_PROVIDER_SITE_OTHER): Payer: Medicare Other | Admitting: Nurse Practitioner

## 2011-03-04 ENCOUNTER — Encounter: Payer: Self-pay | Admitting: Nurse Practitioner

## 2011-03-04 ENCOUNTER — Telehealth: Payer: Self-pay | Admitting: Cardiovascular Disease

## 2011-03-04 ENCOUNTER — Other Ambulatory Visit: Payer: Medicare Other | Admitting: *Deleted

## 2011-03-04 VITALS — BP 128/70 | HR 72 | Ht 70.0 in | Wt 218.0 lb

## 2011-03-04 DIAGNOSIS — I350 Nonrheumatic aortic (valve) stenosis: Secondary | ICD-10-CM

## 2011-03-04 DIAGNOSIS — I4949 Other premature depolarization: Secondary | ICD-10-CM

## 2011-03-04 DIAGNOSIS — I10 Essential (primary) hypertension: Secondary | ICD-10-CM

## 2011-03-04 DIAGNOSIS — I359 Nonrheumatic aortic valve disorder, unspecified: Secondary | ICD-10-CM

## 2011-03-04 DIAGNOSIS — I493 Ventricular premature depolarization: Secondary | ICD-10-CM

## 2011-03-04 MED ORDER — OLMESARTAN MEDOXOMIL-HCTZ 40-12.5 MG PO TABS: 1.0000 | ORAL_TABLET | Freq: Every day | ORAL | Status: DC

## 2011-03-04 NOTE — Telephone Encounter (Signed)
lori NP was consulted and he is not to stop any meds, metoprolol is only used prn for palpitations. He is to use bystolic and benicar/hct. Pt verbalized understanding.

## 2011-03-04 NOTE — Assessment & Plan Note (Signed)
Last echo was in 2010. Will probably need to update this year. Will defer to Dr. Elease Hashimoto.

## 2011-03-04 NOTE — Assessment & Plan Note (Signed)
He continues to use prn metoprolol along with his Bystolic.

## 2011-03-04 NOTE — Assessment & Plan Note (Signed)
We will try him on Benicar HCT 40/12.5 mg. He does have a sulfa allergy and will be on the lookout for any problems. I have given him samples for 4 weeks. I will have Dr. Elease Hashimoto see him in 4 weeks with a BMET. He is to continue to monitor his readings at home. Regular activity is encouraged. Patient is agreeable to this plan and will call if any problems develop in the interim.

## 2011-03-04 NOTE — Patient Instructions (Signed)
Let's try Benicar Hct 40/12.5 daily for your blood pressure.  We will see you in a month with repeat labs. You do not need to fast.  Call the [facility] office at [phone] if you have any questions, problems or concerns.

## 2011-03-04 NOTE — Telephone Encounter (Signed)
New Msg: Pt calling wanting to know if Lawson Fiscal wants him to stop taking bystolic. Please return pt call to discuss further.

## 2011-03-04 NOTE — Progress Notes (Signed)
Alexis Griffith Date of Birth: 01/18/1941 Medical Record #[id number]  History of Present Illness: [name] is seen today for a follow up visit. He is seen for Dr. Elease Griffith. I started him on Benicar at his last visit. Samples were given. He had to reschedule his appointment due to getting the URI bug that's going around. He ran out of his samples. Blood pressure did come down a little but still not at goal. Does eat out almost every day. Not exercising. No chest pain, shortness or breath or syncope reported. He did tolerate the Benicar.   Current Outpatient Prescriptions on File Prior to Visit  Medication Sig Dispense Refill  . buPROPion (WELLBUTRIN SR) 150 MG 12 hr tablet Take 150 mg by mouth daily.        . Choline Fenofibrate (TRILIPIX) 135 MG capsule Take 135 mg by mouth daily.        . clonazePAM (KLONOPIN) 0.5 MG tablet Take 0.5 mg by mouth 4 (four) times daily.        Marland Kitchen dipyridamole-aspirin (AGGRENOX) 25-200 MG per 12 hr capsule Take 1 capsule by mouth 2 (two) times daily.        Marland Kitchen doxazosin (CARDURA) 4 MG tablet Take 4 mg by mouth at bedtime.        Marland Kitchen glimepiride (AMARYL) 4 MG tablet Take 4 mg by mouth daily before breakfast.        . metFORMIN (GLUCOPHAGE) 500 MG tablet Take 500 mg by mouth 2 (two) times daily with a meal.        . metoprolol (LOPRESSOR) 50 MG tablet Take 50 mg by mouth as needed.        . Nebivolol HCl (BYSTOLIC) 20 MG TABS Take 2 tablets (40 mg total) by mouth daily.  60 tablet  12  . rosuvastatin (CRESTOR) 10 MG tablet Take 10 mg by mouth daily.          Allergies  Allergen Reactions  . Lisinopril     Cough   . Macrodantin   . Penicillins   . Sulfa Drugs Cross Reactors   . Tricor   . Zithromax (Azithromycin Dihydrate)     Past Medical History  Diagnosis Date  . PVC (premature ventricular contraction)   . Hypertension   . Hyperlipidemia   . Anxiety   . Sinus bradycardia on ECG   . 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  Annalee Griffith.  . Mild aortic stenosis     per echo in 2010    History reviewed. No pertinent past surgical history.  History  Smoking status  . Former Smoker  . Quit date: 06/25/1975  Smokeless tobacco  . Not on file    History  Alcohol Use No    Family History  Problem Relation Age of Onset  . Heart disease Mother   . Heart attack Mother   . Heart disease Father   . Heart attack Father     Review of Systems: The review of systems is per the HPI. His blood pressure diary is reviewed. Only with marginal control and could be better.  All other systems were reviewed and are negative.  Physical Exam: BP 128/70  Pulse 72  Ht 5\' 10"  (1.778 m)  Wt 218 lb (98.884 kg)  BMI 31.28 kg/m2 Patient is very pleasant and in no acute distress. Skin is warm and dry. Color is normal.  HEENT is unremarkable. Normocephalic/atraumatic. PERRL. Sclera are nonicteric. Neck is  supple. No masses. No JVD. Lungs are clear. Cardiac exam shows a regular rate and rhythm. Abdomen is soft. Extremities are without edema. Gait and ROM are intact. No gross neurologic deficits noted.  LABORATORY DATA:   Assessment / Plan:

## 2011-03-05 ENCOUNTER — Other Ambulatory Visit: Payer: Medicare Other

## 2011-03-17 ENCOUNTER — Encounter: Payer: Self-pay | Admitting: Vascular Surgery

## 2011-03-17 ENCOUNTER — Other Ambulatory Visit: Payer: Self-pay | Admitting: *Deleted

## 2011-03-17 DIAGNOSIS — I6529 Occlusion and stenosis of unspecified carotid artery: Secondary | ICD-10-CM

## 2011-03-17 NOTE — Procedures (Unsigned)
CAROTID DUPLEX EXAM  INDICATION:  Follow up carotid disease.  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  HISTORY: Diabetes:  Yes. Cardiac:  No. Hypertension:  Yes. Smoking:  Previous. Previous Surgery:  No. CV History:  TIA. Amaurosis Fugax No, Paresthesias No, Hemiparesis No.                                      RIGHT             LEFT Brachial systolic pressure:         132               132 Brachial Doppler waveforms:         WNL               WNL Vertebral direction of flow:        Antegrade         Antegrade DUPLEX VELOCITIES (cm/sec) CCA peak systolic                   107               103 ECA peak systolic                   265               83 ICA peak systolic                   220               99 ICA end diastolic                   54                32 PLAQUE MORPHOLOGY:                  Heterogenous      Heterogenous PLAQUE AMOUNT:                      Moderate          Mild PLAQUE LOCATION:                    CCA/ECA/ICA       CCA/ECA/ICA  IMPRESSION: 1. 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 2. 1% to 39% left internal carotid artery stenosis. 3. Bilateral external carotid artery stenosis is observed, right     greater than left. 4. Bilateral vertebral arteries are antegrade, although previous MRI     showed the right side to be occluded. 5. Stable velocities with a downgrade in severity on the right due to     changes in facility criteria.  ___________________________________________ Larina Earthly, M.D.  LT/MEDQ  D:  02/10/2011  T:  02/10/2011  Job:  [id number]

## 2011-04-02 ENCOUNTER — Encounter: Payer: Self-pay | Admitting: Cardiovascular Disease

## 2011-04-02 ENCOUNTER — Ambulatory Visit (INDEPENDENT_AMBULATORY_CARE_PROVIDER_SITE_OTHER): Payer: Medicare Other | Admitting: Cardiovascular Disease

## 2011-04-02 ENCOUNTER — Other Ambulatory Visit (INDEPENDENT_AMBULATORY_CARE_PROVIDER_SITE_OTHER): Payer: Medicare Other | Admitting: *Deleted

## 2011-04-02 DIAGNOSIS — I10 Essential (primary) hypertension: Secondary | ICD-10-CM

## 2011-04-02 DIAGNOSIS — E785 Hyperlipidemia, unspecified: Secondary | ICD-10-CM

## 2011-04-02 NOTE — Assessment & Plan Note (Signed)
Alexis Griffith is doing fairly well. He has been recording his blood pressure. His readings have been in the low normal range. He informed that he really has not taken his Benicar HCT on regular basis. He takes it perhaps once to twice a week.  Given that information I don't think that he really needs to Benicar. His blood pressures on the low normal range it is really only taking it once or twice a week. We will discontinue the Benicar and will continue him on Bystolic.

## 2011-04-02 NOTE — Patient Instructions (Signed)
Your physician recommends that you schedule a follow-up appointment in: 3 months   Your physician recommends that you return for lab work in: 3 months/bmp

## 2011-04-02 NOTE — Progress Notes (Signed)
Alexis Griffith Date of Birth  1940/06/15 [facility]     [place] Office  [address]    [address]   [address] [place], [place]  [address]    [place], [place]  [address] [phone]  Fax  [phone]  [phone]  Fax [phone]   Problem list: 1. Premature ventricular contractions 2. Anxiety 3. Hypertension 4. Hyperlipidemia 5.  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  He has had tongue surgery for squamous cell cancer.  He has recovered well.  He lost 30 lbs during the recovery .   Current Outpatient Prescriptions on File Prior to Visit  Medication Sig Dispense Refill  . buPROPion (WELLBUTRIN SR) 150 MG 12 hr tablet Take 150 mg by mouth daily.        . Choline Fenofibrate (TRILIPIX) 135 MG capsule Take 135 mg by mouth daily.        . clonazePAM (KLONOPIN) 0.5 MG tablet Take 0.5 mg by mouth 4 (four) times daily.        Marland Kitchen dipyridamole-aspirin (AGGRENOX) 25-200 MG per 12 hr capsule Take 1 capsule by mouth 2 (two) times daily.        Marland Kitchen doxazosin (CARDURA) 4 MG tablet Take 4 mg by mouth at bedtime.        Marland Kitchen glimepiride (AMARYL) 4 MG tablet Take 4 mg by mouth daily before breakfast.        . metFORMIN (GLUCOPHAGE) 500 MG tablet Take 500 mg by mouth 2 (two) times daily with a meal.        . metoprolol (LOPRESSOR) 50 MG tablet Take 50 mg by mouth 2 (two) times daily.       . Nebivolol HCl (BYSTOLIC) 20 MG TABS Take 2 tablets (40 mg total) by mouth daily.  60 tablet  12  . olmesartan-hydrochlorothiazide (BENICAR HCT) 40-12.5 MG per tablet Take 1 tablet by mouth daily.      . rosuvastatin (CRESTOR) 10 MG tablet Take 10 mg by mouth daily.          Allergies  Allergen Reactions  . Lisinopril     Cough   . Macrodantin   . Penicillins   . Sulfa Drugs Cross Reactors   . Tricor   . Zithromax (Azithromycin Dihydrate)     Past Medical History    Diagnosis Date  . PVC (premature ventricular contraction)   . Hypertension   . Hyperlipidemia   . Anxiety   . Sinus bradycardia on ECG   . 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 Annalee Genta.  . Mild aortic stenosis     per echo in 2010    No past surgical history on file.  History  Smoking status  . Former Smoker  . Quit date: 06/25/1975  Smokeless tobacco  . Not on file    History  Alcohol Use No    Family History  Problem Relation Age of Onset  . Heart disease Mother   . Heart attack Mother   . Heart disease Father   . Heart attack Father     Reviw of Systems:  Reviewed in the HPI.  All other systems are negative.  Physical Exam: Blood pressure 149/76, pulse 62, height 5\' 10"  (1.778 m), weight 214 lb 12.8 oz (97.433 kg). General: Well developed, well nourished, in no acute distress.  Head: Normocephalic, atraumatic, sclera non-icteric, mucus membranes are moist,   Neck: Supple. Right carotid bruit. JVD not elevated.  Lungs: Clear bilaterally to auscultation without wheezes, rales, or rhonchi. Breathing is unlabored.  Heart: RRR with S1 S2. There is 1-2 /6 systolic murmur.  Abdomen: Soft, non-tender, non-distended with normoactive bowel sounds. No hepatomegaly. No rebound/guarding. No obvious abdominal masses.  Msk:  Strength and tone appear normal for age.  Extremities: No clubbing or cyanosis. No edema.  Distal pedal pulses are 2+ and equal bilaterally.  Neuro: Alert and oriented X 3. Moves all extremities spontaneously.  Psych:  Responds to questions appropriately with a normal affect.  ECG: Sinus rhythm. He has nonspecific ST and T wave changes.  Assessment / Plan:

## 2011-04-02 NOTE — Assessment & Plan Note (Signed)
We will get fasting lipids soon.

## 2011-04-03 LAB — BASIC METABOLIC PANEL
BUN: 21 mg/dL (ref 6–23)
CO2: 28 mEq/L (ref 19–32)
Calcium: 9.6 mg/dL (ref 8.4–10.5)
Chloride: 105 mEq/L (ref 96–112)
Creatinine, Ser: 1.2 mg/dL (ref 0.4–1.5)
GFR: 62.39 mL/min (ref >60.00)
Glucose, Bld: 160 mg/dL — ABNORMAL HIGH (ref 70–99)
Potassium: 3.9 mEq/L (ref 3.5–5.1)
Sodium: 139 mEq/L (ref 135–145)

## 2011-04-08 ENCOUNTER — Other Ambulatory Visit (INDEPENDENT_AMBULATORY_CARE_PROVIDER_SITE_OTHER): Payer: Medicare Other | Admitting: *Deleted

## 2011-04-08 DIAGNOSIS — E785 Hyperlipidemia, unspecified: Secondary | ICD-10-CM

## 2011-04-08 LAB — HEPATIC FUNCTION PANEL
ALT: 17 U/L (ref 0–53)
AST: 19 U/L (ref 0–37)
Albumin: 4 g/dL (ref 3.5–5.2)
Alkaline Phosphatase: 35 U/L — ABNORMAL LOW (ref 39–117)
Bilirubin, Direct: 0.1 mg/dL (ref 0.0–0.3)
Total Bilirubin: 0.5 mg/dL (ref 0.3–1.2)
Total Protein: 6.9 g/dL (ref 6.0–8.3)

## 2011-04-08 LAB — BASIC METABOLIC PANEL
BUN: 16 mg/dL (ref 6–23)
CO2: 26 mEq/L (ref 19–32)
Calcium: 9.3 mg/dL (ref 8.4–10.5)
Chloride: 104 mEq/L (ref 96–112)
Creatinine, Ser: 1.1 mg/dL (ref 0.4–1.5)
GFR: 71.05 mL/min (ref >60.00)
Glucose, Bld: 116 mg/dL — ABNORMAL HIGH (ref 70–99)
Potassium: 3.8 mEq/L (ref 3.5–5.1)
Sodium: 139 mEq/L (ref 135–145)

## 2011-04-08 LAB — LIPID PANEL
Cholesterol: 116 mg/dL (ref 0–200)
HDL: 34.8 mg/dL — ABNORMAL LOW (ref >39.00)
LDL Cholesterol: 60 mg/dL (ref 0–99)
Total CHOL/HDL Ratio: 3
Triglycerides: 104 mg/dL (ref 0.0–149.0)
VLDL: 20.8 mg/dL (ref 0.0–40.0)

## 2011-04-17 ENCOUNTER — Telehealth: Payer: Self-pay | Admitting: Cardiovascular Disease

## 2011-04-17 NOTE — Telephone Encounter (Signed)
Has not been using Benicar on a regular basis.  Still has some left of samples of the 28 samples Lawson Fiscal gave him on 03/04/11.  Read over Dr Harvie Bridge not from office visit 04/02/11 which stated that he had not been taking Benicar at home so he was going to d/c since blood pressure on the low end of normal.  Stated his blood pressures have been good at home.  He is going to use just the Bystolic for now and will continue to monitor his blood pressure at home and call back if it starts to go up.  Will forward to [facility] and Dr Elease Hashimoto

## 2011-04-17 NOTE — Telephone Encounter (Signed)
New msg Pt was getting samples of benicar/HCT 40 mg/12.5 He would like rx of this med called to [place] [phone]

## 2011-05-13 ENCOUNTER — Telehealth: Payer: Self-pay | Admitting: Cardiovascular Disease

## 2011-05-13 NOTE — Telephone Encounter (Signed)
Pt been taking bystolic but taking a few more than prescribed and more of the lopressor and has a few benicar hct left over that he took once and are off his med list. We spent 20 minutes discussing BP results, he doesn't write them down and admits to being obsessive with getting frequent BP readings through the day. Usual bp is 145/50, 150/62, as high as 160/65 with pulse range 50-60. Denies dizziness no cp.  I asked him to get bp 1-2 hrs after morning bp med then once more in the evening and if evening bp elevated like in range of > 170/ 80 that he could take the metoprolol. Asked him to not take the benicar hct unless Dr Elease Hashimoto or Lawson Fiscal NP re prescribed it. He was reminded to refrain from sodium/caffiene in diet, asked him to try to relieve his stress by walking 20 minutes a day, described family stressors that agitate him. Please advise if additional meds are needed for BP, pt understands Dr Elease Hashimoto out of office this week and I will contact him next week with any med changes. Pt was agreeable to plan and will call sooner with questions or concerns,

## 2011-05-13 NOTE — Telephone Encounter (Signed)
New Problem:    Patient called in wanting to speak with you about receiving a prescription for Benicar. Please call back.

## 2011-05-15 NOTE — Telephone Encounter (Signed)
I have discussed this ongoing issue of Alexis Griffith taking multiple different BP meds on a random basis and the fact that this type of self medication will not achieve any long term successful BP control.  I agree that he should walk regularly.  Agree that he should take his BP as Alexis Ramus, RN has recommended.  He needs to keep a BP log and needs to take only the meds and only the doses that his health care providers recommend.  He can come to the office for a BP check after several weeks of keeping track of his readings.

## 2011-05-15 NOTE — Telephone Encounter (Signed)
Pt advised and agreed to keep log and will call back to set up bp app/ declined set up currently.

## 2011-06-01 ENCOUNTER — Telehealth: Payer: Self-pay | Admitting: Cardiovascular Disease

## 2011-06-01 NOTE — Telephone Encounter (Signed)
05/19/11 started recordings.  These are morning and evening results. Morning 10 am reading eve 10 pm results. 120/55 p61 143/60 p 62 124/59 p 62, 133/58 p 58, 125/61 p 58 138/64 p 57 133/63 p 57, 138/66 p 56, 128/64 p 55, 130/64 p 58.  Pt taking bystolic  20 mg BID and using lopressor 50 mg BID daily not prn, pt has a f/u app 07/02/11 unless you need to see him earlier, pt told to continue to log bp results and keep f/u app, pt agreed to plan.

## 2011-06-01 NOTE — Telephone Encounter (Signed)
These BP and HR readings look good. Continue current meds

## 2011-06-01 NOTE — Telephone Encounter (Signed)
Pt has b/p readings and he wants to give this information to you and wants to schedule an appt and he asked to do this with you

## 2011-06-10 NOTE — Telephone Encounter (Signed)
Follow-up:     Patient called in wanting to know if he could go back on his benicar HTZ 40mg  because due to the stress in his life he has to take more of his Nebivolol HCl (BYSTOLIC) 20 MG TABS and metoprolol (LOPRESSOR) 50 MG tablet daily to even out his BP.  Please call back.

## 2011-06-10 NOTE — Telephone Encounter (Signed)
Have him start Benicar 40 mg a day. You'll need to have a basic metabolic profile drawn in 3 weeks. Please check his blood pressure at that time.

## 2011-06-10 NOTE — Telephone Encounter (Signed)
Had samples of the Benicar and thinks that seemed to help him better.  States he takes Bystolic 20 mg bid and Lopressor sometimes 3 a day.  Blood pressure usually 170-180 before meds.  Has had today already 2 bystolic, 1 Lopressor, and 2 Klonopin today and blood pressure is only down to 140-150 and is concerned with these numbers.  Knows this is stress related.  Will forward to Dr Elease Hashimoto for review

## 2011-06-11 NOTE — Telephone Encounter (Signed)
The patient was wanting to go back on his Benicar hctz and stopping his Bystolic, not in addition to.  Will forward to Dr Elease Hashimoto for review

## 2011-06-12 ENCOUNTER — Other Ambulatory Visit: Payer: Self-pay | Admitting: *Deleted

## 2011-06-12 DIAGNOSIS — I10 Essential (primary) hypertension: Secondary | ICD-10-CM

## 2011-06-12 MED ORDER — OLMESARTAN MEDOXOMIL 40 MG PO TABS: 40.0000 mg | ORAL_TABLET | Freq: Every day | ORAL | Status: DC

## 2011-06-12 NOTE — Telephone Encounter (Signed)
Per Dr. Elease Hashimoto, needs to stop the Bystolic and restart his Benicar HCT.   Please give appointment with either me or Dr. Elease Hashimoto in about 2 weeks with a BMET

## 2011-06-12 NOTE — Telephone Encounter (Signed)
He changes his meds constantly and it has been difficult for [place] to keep him on any consistent therapy.  Have him come see Lawson Fiscal or me after stopping the bystolic and restarting the benicar hctz.  Will need BMP also

## 2011-06-15 NOTE — Telephone Encounter (Signed)
See app set in 2 weeks

## 2011-06-18 ENCOUNTER — Other Ambulatory Visit: Payer: Self-pay | Admitting: *Deleted

## 2011-06-18 DIAGNOSIS — I10 Essential (primary) hypertension: Secondary | ICD-10-CM

## 2011-06-18 MED ORDER — OLMESARTAN MEDOXOMIL-HCTZ 40-12.5 MG PO TABS: 1.0000 | ORAL_TABLET | Freq: Every day | ORAL | Status: DC

## 2011-06-18 NOTE — Telephone Encounter (Signed)
MEDS VERIFIED WITH PHARMACY, SAW DOSE IN PRIOR NOTE, REORDERED.

## 2011-06-29 ENCOUNTER — Ambulatory Visit: Payer: Medicare Other | Admitting: Nurse Practitioner

## 2011-06-29 ENCOUNTER — Other Ambulatory Visit: Payer: Medicare Other

## 2011-07-02 ENCOUNTER — Ambulatory Visit: Payer: Medicare Other | Admitting: Cardiovascular Disease

## 2011-07-09 ENCOUNTER — Telehealth: Payer: Self-pay | Admitting: Cardiovascular Disease

## 2011-07-09 NOTE — Telephone Encounter (Signed)
Pt is concerned and is not going to take his benicar because when he first started taking it he was getting labs done and he was due to come in on 5/2 but got sick and had to push his appt out and has not had any labs yet so he will stop until he hears from you how often he needs lab work to take this medication

## 2011-07-09 NOTE — Telephone Encounter (Signed)
Pt cancelled lori gerhardt np app/ lab app/ dr Elease Hashimoto app, pt stopped benicar on own, went back to old med's on hand, he states he had two to choose from- bystolic or lopressor, pt went back to bystolic, he did this because he was concerned with the reasoning of why he needed a lab draw after starting this med, informed pt that we check kidneys and k+ after any new bp med. Pt states he will restart med again and have lab work on f/u with dr Elease Hashimoto. Dr Elease Hashimoto informed

## 2011-07-13 ENCOUNTER — Other Ambulatory Visit: Payer: Self-pay | Admitting: *Deleted

## 2011-07-14 ENCOUNTER — Other Ambulatory Visit: Payer: Medicare Other

## 2011-07-16 ENCOUNTER — Other Ambulatory Visit: Payer: Self-pay | Admitting: *Deleted

## 2011-07-16 NOTE — Telephone Encounter (Signed)
Opened in Error.

## 2011-07-28 ENCOUNTER — Telehealth: Payer: Self-pay | Admitting: *Deleted

## 2011-07-28 NOTE — Telephone Encounter (Signed)
msg left to return call, repeated request to fill bystolic from pharmacy, last note from Dr Elease Hashimoto pt was to stop med. Pt switches meds on own without consulting. Asked pt to call back to discuss, noted he cancelled his med f/u  With Lawson Fiscal and with Dr Elease Hashimoto and didn't make his lab app. Med not filled, spoke with pharmacy.

## 2011-07-31 NOTE — Telephone Encounter (Signed)
Let pt know request to fill bystolic was denied by Dr Elease Hashimoto, pt is taking benicar/hct and using lopressor prn. Pt worries when systolic is 150 or 160 sometimes during the day but not consistently. Pt told to keep journal and discuss at next ov, call with questions or concerns.

## 2011-07-31 NOTE — Telephone Encounter (Signed)
Pt rtn your call

## 2011-08-24 ENCOUNTER — Ambulatory Visit (INDEPENDENT_AMBULATORY_CARE_PROVIDER_SITE_OTHER): Payer: Medicare Other | Admitting: Cardiovascular Disease

## 2011-08-24 ENCOUNTER — Encounter: Payer: Self-pay | Admitting: Cardiovascular Disease

## 2011-08-24 VITALS — BP 145/75 | HR 84 | Ht 70.0 in | Wt 214.8 lb

## 2011-08-24 DIAGNOSIS — I350 Nonrheumatic aortic (valve) stenosis: Secondary | ICD-10-CM

## 2011-08-24 DIAGNOSIS — I779 Disorder of arteries and arterioles, unspecified: Secondary | ICD-10-CM | POA: Insufficient documentation

## 2011-08-24 DIAGNOSIS — I10 Essential (primary) hypertension: Secondary | ICD-10-CM

## 2011-08-24 DIAGNOSIS — I359 Nonrheumatic aortic valve disorder, unspecified: Secondary | ICD-10-CM

## 2011-08-24 HISTORY — DX: Disorder of arteries and arterioles, unspecified: I77.9

## 2011-08-24 NOTE — Assessment & Plan Note (Signed)
Seems to be fairly stable. His systolic murmur stable.

## 2011-08-24 NOTE — Progress Notes (Signed)
[name] Date of Birth  [birth year] [facility]     [place] Office  [address]    [address]   [address] [place], [place]  [address]    [place], [place]  [address] [phone]  Fax  [phone]  [phone]  Fax [phone]   Problem list: 1. Premature ventricular contractions 2. Anxiety 3. Hypertension 4. Hyperlipidemia 5.  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 Bilateral carotid artery disease - moderate  History of Present Illness:  [name] done well.  He has had some palpitations. He typically takes as needed metoprolol if he has worsening palpitations.  He has a hx of PVDs.   He had some severe shortness breath when he was trying to start a lawnmower a week or so ago.  He denies any  syncope or presyncope. He denies any chest pain, shortness breath, nausea or vomiting.   Current Outpatient Prescriptions on File Prior to Visit  Medication Sig Dispense Refill  . buPROPion (WELLBUTRIN SR) 150 MG 12 hr tablet Take 150 mg by mouth daily.        . Choline Fenofibrate (TRILIPIX) 135 MG capsule Take 135 mg by mouth daily.        . clonazePAM (KLONOPIN) 0.5 MG tablet Take 0.5 mg by mouth 4 (four) times daily.        Marland Kitchen dipyridamole-aspirin (AGGRENOX) 25-200 MG per 12 hr capsule Take 1 capsule by mouth 2 (two) times daily.        Marland Kitchen doxazosin (CARDURA) 4 MG tablet Take 4 mg by mouth at bedtime.        Marland Kitchen glimepiride (AMARYL) 4 MG tablet Take 4 mg by mouth daily before breakfast.        . metFORMIN (GLUCOPHAGE) 500 MG tablet Take 500 mg by mouth 2 (two) times daily with a meal.        . metoprolol (LOPRESSOR) 50 MG tablet Take 50 mg by mouth 2 (two) times daily as needed.       Marland Kitchen olmesartan-hydrochlorothiazide (BENICAR HCT) 40-12.5 MG per tablet Take 1 tablet by mouth daily.  30 tablet  5  . rosuvastatin (CRESTOR) 10 MG tablet Take 10 mg by mouth daily.          Allergies  Allergen Reactions  . Fenofibrate   .  Lisinopril     Cough   . Macrodantin   . Penicillins   . Sulfa Drugs Cross Reactors   . Zithromax (Azithromycin Dihydrate)     Past Medical History  Diagnosis Date  . PVC (premature ventricular contraction)   . Hypertension   . Hyperlipidemia   . Anxiety   . Sinus bradycardia on ECG   . 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 Annalee Genta.  . Mild aortic stenosis     per echo in 2010    History reviewed. No pertinent past surgical history.  History  Smoking status  . Former Smoker  . Quit date: 06/25/1975  Smokeless tobacco  . Not on file    History  Alcohol Use No    Family History  Problem Relation Age of Onset  . Heart disease Mother   . Heart attack Mother   . Heart disease Father   . Heart attack Father     Reviw of Systems:  Reviewed in the HPI.  All other systems are negative.  Physical Exam: Blood pressure 152/75,  pulse 84, height 5\' 10"  (1.778 m), weight 214 lb 12.8 oz (97.433 kg). General: Well developed, well nourished, in no acute distress.  Head: Normocephalic, atraumatic, sclera non-icteric, mucus membranes are moist,   Neck: Supple. Soft bilateral carotid bruits. JVD not elevated.  Lungs: Clear bilaterally to auscultation without wheezes, rales, or rhonchi. Breathing is unlabored.  Heart: RRR with S1 S2. There is 1-2 /6 systolic murmur.  Abdomen: Soft, non-tender, non-distended with normoactive bowel sounds. No hepatomegaly. No rebound/guarding. No obvious abdominal masses.  Msk:  Strength and tone appear normal for age.  Extremities: No clubbing or cyanosis. No edema.  Distal pedal pulses are 2+ and equal bilaterally.  Neuro: Alert and oriented X 3. Moves all extremities spontaneously.  Psych:  Responds to questions appropriately with a normal affect.  ECG:  Assessment / Plan:

## 2011-08-24 NOTE — Assessment & Plan Note (Signed)
He has at least moderate carotid artery disease by duplex scan. He has a soft bilateral bruits. We will get the results of his most recent scan from the [facility]. We'll continue to be aggressive with his cholesterol lowering.

## 2011-08-24 NOTE — Assessment & Plan Note (Signed)
Alexis Griffith's blood pressure is still mildly elevated. It is clear that he still gets a lot of extra salt. He eats out almost every night. I've asked him to watch his salt intake.  We'll check a basic metabolic profile today. He started HCTZ during his last visit.

## 2011-08-24 NOTE — Patient Instructions (Addendum)
Your physician recommends that you return for lab work in: TODAY BMET TO CHECK POTASSIUM  Your physician wants you to follow-up in: 6 MONTHS You will receive a reminder letter in the mail two months in advance. If you don't receive a letter, please call our office to schedule the follow-up appointment.

## 2011-08-25 LAB — BASIC METABOLIC PANEL
BUN: 17 mg/dL (ref 6–23)
CO2: 27 mEq/L (ref 19–32)
Calcium: 9.7 mg/dL (ref 8.4–10.5)
Chloride: 103 mEq/L (ref 96–112)
Creatinine, Ser: 1.1 mg/dL (ref 0.4–1.5)
GFR: 68.78 mL/min (ref >60.00)
Glucose, Bld: 165 mg/dL — ABNORMAL HIGH (ref 70–99)
Potassium: 3.8 mEq/L (ref 3.5–5.1)
Sodium: 138 mEq/L (ref 135–145)

## 2011-11-03 ENCOUNTER — Telehealth: Payer: Self-pay | Admitting: Cardiovascular Disease

## 2011-11-03 NOTE — Telephone Encounter (Signed)
Pt calling re taking Benacar hct for awhile, and sometimes with it takes Lopresser, about 3 weeks ago, Bp was too low, so had to stop Benacar, pls advise

## 2011-11-03 NOTE — Telephone Encounter (Signed)
bp been low, been off benicar-hctz for 3 weeks and only using metoprolol prn. Pts home life is less stressful currently. While pt was on benicar- hctz bp went to 94/45 p 45-50, now taking metoprolol prn it is 109/45 p 60 this was after metoprolol prior it was 129/60 p 60's. Pt told to only take metoprolol prn and benicar was taken off the list. I will forward to Dr as an [place]ain Childes

## 2011-11-03 NOTE — Telephone Encounter (Signed)
Alexis Griffith is a long time patient of mine who I like very much.   He  insists on managing his own BP meds.  He constantly changes his meds on his own for months at a time and then call us for advice when his BP is not controlled and wants advice.    I will continue to see him an advise him on his BP when he is seen in the office but I cannot continue to give advice about all of his medication changes that he makes on his own.  He may make an interem apt. To see a PA /NP or see one of the other doctors if he needs advice on changing his medications.  I have slots in [place] that are not filled and would be happy to see him in [place] when possible.

## 2011-11-04 NOTE — Telephone Encounter (Signed)
Spoke to patient was told Dr.Nahser recommended appointment.Appointment scheduled with Dr.Nahser in [place] office 11/18/11 at 9:45 am.

## 2011-11-09 ENCOUNTER — Other Ambulatory Visit: Payer: Self-pay | Admitting: *Deleted

## 2011-11-09 MED ORDER — METOPROLOL TARTRATE 50 MG PO TABS: 50.0000 mg | ORAL_TABLET | Freq: Two times a day (BID) | ORAL | Status: DC | PRN

## 2011-11-09 NOTE — Telephone Encounter (Signed)
Fax Received. Refill Completed. Alexis Griffith (R.M.A)   

## 2011-11-14 ENCOUNTER — Encounter: Payer: Self-pay | Admitting: Family Medicine

## 2011-11-14 ENCOUNTER — Ambulatory Visit (INDEPENDENT_AMBULATORY_CARE_PROVIDER_SITE_OTHER): Payer: Medicare Other | Admitting: Family Medicine

## 2011-11-14 VITALS — BP 133/77 | HR 75 | Temp 98.0°F | Resp 16 | Ht 70.5 in | Wt 205.6 lb

## 2011-11-14 DIAGNOSIS — J309 Allergic rhinitis, unspecified: Secondary | ICD-10-CM

## 2011-11-14 DIAGNOSIS — IMO0001 Reserved for inherently not codable concepts without codable children: Secondary | ICD-10-CM

## 2011-11-14 DIAGNOSIS — R5381 Other malaise: Secondary | ICD-10-CM

## 2011-11-14 DIAGNOSIS — M791 Myalgia, unspecified site: Secondary | ICD-10-CM

## 2011-11-14 DIAGNOSIS — J329 Chronic sinusitis, unspecified: Secondary | ICD-10-CM

## 2011-11-14 DIAGNOSIS — R5383 Other fatigue: Secondary | ICD-10-CM

## 2011-11-14 MED ORDER — AZITHROMYCIN 250 MG PO TABS: ORAL_TABLET | ORAL | Status: DC

## 2011-11-14 MED ORDER — FLUTICASONE PROPIONATE 50 MCG/ACT NA SUSP: 2.0000 | Freq: Every day | NASAL | Status: DC

## 2011-11-14 NOTE — Progress Notes (Signed)
Subjective:    Patient ID: Alexis Griffith, male    DOB: [birth year], 71 y.o.   MRN: [id number]  HPIThis 71 y.o. male presents for evaluation of sinus headache.  Onset 7-10 days ago.  No fever/chills/sweats.  +HA L periorbital region.  No pain with chewing; hurts to cough; no vision changes/blurred vision.  No sore throat or ear pain.  +rhinorrhea; +nasal congestion; clear drainage; has been using saline solution twice daily. +PND.  +sneezing.  +itchy eyes.  No cough.  No SOB.  Not sure what OTC medications pt can take with CAD, BPH.  Malaise/fatigue but chronic; feels due to side effects of medication; s/p sleep study; could not tolerate mask.  Diagnosed with sleep apnea.  +seasonal allergies; fall and spring allergies.   Checking sugars once weekly; 104.  Has lost 50 pounds in past two years intentional.    Not aware of any allergic reaction to Zpack; has taken multiple times in past.  2.  Low blood pressure: running 115/65.  Stopped Benicar/HCTZ; now using Lopressor bid PRN palpitations; appointment in 12/2010 with PCP.  Chronic fatigue for past year; no dizziness.    3.  Myalgias:  Chronic issue for patient.  Stopped Crestor 3 days ago with improvement in muscle aches.  Previously took Lipitor with similar side effects.    PCP:  [place]; last visit one year ago.   Review of Systems  Constitutional: Positive for fatigue. Negative for fever, chills and diaphoresis.  HENT: Positive for congestion, rhinorrhea, sneezing, voice change, postnasal drip and sinus pressure. Negative for ear pain, sore throat, drooling and trouble swallowing.   Respiratory: Negative for cough and shortness of breath.   Gastrointestinal: Negative for nausea, vomiting, abdominal pain and diarrhea.  Musculoskeletal: Positive for myalgias.  Neurological: Positive for headaches. Negative for dizziness, facial asymmetry and light-headedness.        Past Medical History  Diagnosis Date  . PVC (premature ventricular  contraction)   . Hypertension   . Hyperlipidemia   . Anxiety   . Sinus bradycardia on ECG   . 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 Annalee Genta.  . Mild aortic stenosis     per echo in 2010    No past surgical history on file.  Prior to Admission medications   Medication Sig Start Date End Date Taking? Authorizing Provider  buPROPion (WELLBUTRIN SR) 150 MG 12 hr tablet Take 150 mg by mouth daily.     Yes Historical Provider, MD  Choline Fenofibrate (TRILIPIX) 135 MG capsule Take 135 mg by mouth daily.     Yes Historical Provider, MD  clonazePAM (KLONOPIN) 0.5 MG tablet Take 0.5 mg by mouth 4 (four) times daily.     Yes Historical Provider, MD  dipyridamole-aspirin (AGGRENOX) 25-200 MG per 12 hr capsule Take 1 capsule by mouth 2 (two) times daily.     Yes Historical Provider, MD  doxazosin (CARDURA) 4 MG tablet Take 4 mg by mouth at bedtime.     Yes Historical Provider, MD  glimepiride (AMARYL) 4 MG tablet Take 4 mg by mouth daily before breakfast.     Yes Historical Provider, MD  metFORMIN (GLUCOPHAGE) 500 MG tablet Take 500 mg by mouth 2 (two) times daily with a meal.     Yes Historical Provider, MD  metoprolol (LOPRESSOR) 50 MG tablet Take 1 tablet (50 mg total) by mouth 2 (two) times daily as needed. 11/09/11  Yes Vesta Mixer, MD  azithromycin [facility]  Z-PAK) 250 MG tablet Take 2 tablets daily x 1 day, then 1 daily x 4 days 11/14/11 11/19/11  Ethelda Chick, MD  fluticasone [facility]) 50 MCG/ACT nasal spray Place 2 sprays into the nose daily. 11/14/11 11/13/12  Ethelda Chick, MD  rosuvastatin (CRESTOR) 10 MG tablet Take 10 mg by mouth daily.      Historical Provider, MD    Allergies  Allergen Reactions  . Fenofibrate   . Lisinopril     Cough   . Macrodantin   . Penicillins   . Sulfa Drugs Cross Reactors   . Zithromax (Azithromycin Dihydrate)     History   Social History  . Marital Status: Married    Spouse Name: N/A    Number of Children:  N/A  . Years of Education: N/A   Occupational History  . Not on file.   Social History Main Topics  . Smoking status: Former Smoker    Quit date: 06/25/1975  . Smokeless tobacco: Not on file  . Alcohol Use: No  . Drug Use: No  . Sexually Active: Yes   Other Topics Concern  . Not on file   Social History Narrative  . No narrative on file    Family History  Problem Relation Age of Onset  . Heart disease Mother   . Heart attack Mother   . Heart disease Father   . Heart attack Father     Objective:   Physical Exam  Nursing note and vitals reviewed. Constitutional: He is oriented to person, place, and time. He appears well-developed and well-nourished. No distress.  HENT:  Head: Normocephalic and atraumatic.  Right Ear: External ear normal.  Left Ear: External ear normal.  Nose: Nose normal.  Mouth/Throat: Oropharynx is clear and moist. No oropharyngeal exudate.  Eyes: Conjunctivae normal and EOM are normal. Pupils are equal, round, and reactive to light.  Neck: Normal range of motion. Neck supple. No thyromegaly present.  Cardiovascular: Normal rate, regular rhythm, normal heart sounds and intact distal pulses.   No murmur heard. Pulmonary/Chest: Effort normal and breath sounds normal.  Lymphadenopathy:    He has no cervical adenopathy.  Neurological: He is alert and oriented to person, place, and time. No cranial nerve deficit. He exhibits normal muscle tone.  Skin: Skin is warm and dry. No rash noted. He is not diaphoretic.  Psychiatric: He has a normal mood and affect. His behavior is normal. Judgment and thought content normal.        Assessment & Plan:   1. Allergic rhinitis    2. Fatigue    3. Myalgia    4. Sinusitis  azithromycin (ZITHROMAX Z-PAK) 250 MG tablet    1.  Allergic Rhinitis: New/worsening.  Rx for Flonase to use daily; continue nasal saline daily also. 2. Sinusitis Acute Frontal: New.  Rx for Zpack provided; pt declines allergic reaction to  Zpack in past.  3.  Myalgias: New. Improvement in past 72 hours since holding Crestor. Recommend holding Crestor for two weeks and then restart Crestor 1/2 tablet daily.   4.  Fatigue:  New.  Onset in past year.  S/p sleep study in past with +OSA but did not tolerate CPAP; s/p 50 pound intentional weight loss in past two years; has appointment with PCP in upcoming month for follow-up; plans to discuss in more detail at that time; has stopped Crestor in past two days; advised to hold for next two weeks and reevaluate myalgias, fatigue. 5. Low blood pressure: improved;  normal blood pressure today; to continue monitoring blood pressure at home.  Has recently stopped ARB due to lower blood pressure readings.

## 2011-11-14 NOTE — Patient Instructions (Addendum)
1. Allergic rhinitis    2. Fatigue    3. Myalgia    4. Sinusitis  azithromycin (ZITHROMAX Z-PAK) 250 MG tablet

## 2011-11-16 ENCOUNTER — Telehealth: Payer: Self-pay | Admitting: Cardiovascular Disease

## 2011-11-16 DIAGNOSIS — E7800 Pure hypercholesterolemia, unspecified: Secondary | ICD-10-CM

## 2011-11-16 NOTE — Telephone Encounter (Signed)
Discussed with patient and he would like labs done at appointment with Dr Elease Hashimoto, ordered labs to be done

## 2011-11-16 NOTE — Telephone Encounter (Signed)
New problem:  Would like to be set up for lab work during office visit. Taken crestor.

## 2011-11-17 NOTE — Progress Notes (Signed)
Reviewed and agree.

## 2011-11-18 ENCOUNTER — Encounter: Payer: Self-pay | Admitting: Cardiovascular Disease

## 2011-11-18 ENCOUNTER — Ambulatory Visit (INDEPENDENT_AMBULATORY_CARE_PROVIDER_SITE_OTHER): Payer: Medicare Other | Admitting: Cardiovascular Disease

## 2011-11-18 VITALS — BP 120/62 | HR 69 | Ht 70.0 in | Wt 205.0 lb

## 2011-11-18 DIAGNOSIS — E785 Hyperlipidemia, unspecified: Secondary | ICD-10-CM

## 2011-11-18 DIAGNOSIS — I4949 Other premature depolarization: Secondary | ICD-10-CM

## 2011-11-18 DIAGNOSIS — I493 Ventricular premature depolarization: Secondary | ICD-10-CM

## 2011-11-18 DIAGNOSIS — I10 Essential (primary) hypertension: Secondary | ICD-10-CM

## 2011-11-18 DIAGNOSIS — I779 Disorder of arteries and arterioles, unspecified: Secondary | ICD-10-CM

## 2011-11-18 DIAGNOSIS — E7800 Pure hypercholesterolemia, unspecified: Secondary | ICD-10-CM

## 2011-11-18 NOTE — Progress Notes (Signed)
[name] Date of Birth  08/27/1940 [facility]     [place] Office  [address]    [address]   [address] [place], [place]  [address]    [place], [place]  [address] [phone]  Fax  [phone]  [phone]  Fax [phone]   Problem list: 1. Premature ventricular contractions 2. Anxiety 3. Hypertension 4. Hyperlipidemia 5. 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 Bilateral carotid artery disease - moderate  History of Present Illness:  [name] done well.  He has continued to lose weight.  He has been watching his diet.  He is not exercising very much.  He has stopped his Crestor because of muscle aches.  His muscle pains resolved 2 days after he stopped the Crestor.  He had been on the Crestor for 15 years but stopped it several weeks ago because of his muscle  Aches.   Current Outpatient Prescriptions on File Prior to Visit  Medication Sig Dispense Refill  . buPROPion (WELLBUTRIN SR) 150 MG 12 hr tablet Take 150 mg by mouth daily.        . Choline Fenofibrate (TRILIPIX) 135 MG capsule Take 135 mg by mouth daily.        . clonazePAM (KLONOPIN) 0.5 MG tablet Take 0.5 mg by mouth 4 (four) times daily.        Marland Kitchen doxazosin (CARDURA) 4 MG tablet Take 4 mg by mouth at bedtime.        . fluticasone (FLONASE) 50 MCG/ACT nasal spray Place 2 sprays into the nose daily.  16 g  6  . glimepiride (AMARYL) 4 MG tablet Take 4 mg by mouth daily before breakfast.        . metFORMIN (GLUCOPHAGE) 500 MG tablet Take 500 mg by mouth 2 (two) times daily with a meal.        . metoprolol (LOPRESSOR) 50 MG tablet Take 1 tablet (50 mg total) by mouth 2 (two) times daily as needed.  60 tablet  5  . dipyridamole-aspirin (AGGRENOX) 25-200 MG per 12 hr capsule Take 1 capsule by mouth 2 (two) times daily.          Allergies  Allergen Reactions  . Fenofibrate   . Lisinopril     Cough   . Macrodantin   . Penicillins   . Sulfa Drugs  Cross Reactors   . Zithromax (Azithromycin Dihydrate)     Past Medical History  Diagnosis Date  . PVC (premature ventricular contraction)   . Hypertension   . Hyperlipidemia   . Anxiety   . Sinus bradycardia on ECG   . 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 Annalee Genta.  . Mild aortic stenosis     per echo in 2010    History reviewed. No pertinent past surgical history.  History  Smoking status  . Former Smoker  . Quit date: 06/25/1975  Smokeless tobacco  . Not on file    History  Alcohol Use No    Family History  Problem Relation Age of Onset  . Heart disease Mother   . Heart attack Mother   . Heart disease Father   . Heart attack Father     Reviw of Systems:  Reviewed in the HPI.  All other systems are negative.  Physical Exam: Blood pressure 120/62, pulse 69, height 5\' 10"  (1.778 m), weight 205 lb (92.987 kg). General: Well developed,  well nourished, in no acute distress.  Head: Normocephalic, atraumatic, sclera non-icteric, mucus membranes are moist,   Neck: Supple. Soft right carotid bruit. JVD not elevated.  Lungs: Clear bilaterally to auscultation without wheezes, rales, or rhonchi. Breathing is unlabored.  Heart: RRR with S1 S2. There is  2 /6 systolic murmur.  Abdomen: Soft, non-tender, non-distended with normoactive bowel sounds. No hepatomegaly. No rebound/guarding. No obvious abdominal masses.  Msk:  Strength and tone appear normal for age.  Extremities: No clubbing or cyanosis. No edema.  Distal pedal pulses are 2+ and equal bilaterally.  Neuro: Alert and oriented X 3. Moves all extremities spontaneously.  Psych:  Responds to questions appropriately with a normal affect.  ECG: Sept. 18, 2013 -- NSR at 69,  Normal ECG   Assessment / Plan:

## 2011-11-18 NOTE — Assessment & Plan Note (Signed)
He started having muscle aches with Crestor. He had tolerated Crestor for the past 15 years.  We will check a fasting lipid profile, HFP, and basic metabolic profile today to see where he is.  We discussed other options including low-dose Lipitor, Zetia, and even trying Crestor are much reduced dose such as once or twice a week. We will see where his lipid levels are and go from there.  I am pleased that he has  lost weight. This will certainly help his hyperlipidemia.  He has a history of only mild coronary artery disease and has never needed a PCI. I do think that her ultimate goal should be an LDL of around 70 although we may have to accept  an LDL of around 100.

## 2011-11-18 NOTE — Patient Instructions (Addendum)
Your physician wants you to follow-up in: 6 month with Dr. Elease Hashimoto. You will receive a reminder letter in the mail two months in advance. If you don't receive a letter, please call our office to schedule the follow-up appointment.  Your physician recommends that you have lab work today and again in 6 months, prior to your next appointment with your doctor.

## 2011-11-18 NOTE — Assessment & Plan Note (Signed)
Alexis Griffith's BP is much better.  He has stopped his Benicar because of hypotension.  I suspect his BP is getting easier to control because of his weight loss.    Continue cardura and metoprolol.  I have encouraged him to start exercising.    I will see him in 6 months for OV and fasting, lipids, liver enzymes, and BMP.

## 2011-11-19 LAB — LIPID PANEL
Chol/HDL Ratio: 4.2 ratio units (ref 0.0–5.0)
Cholesterol, Total: 129 mg/dL (ref 100–199)
HDL: 31 mg/dL — ABNORMAL LOW (ref >39)
LDL Calculated: 78 mg/dL (ref 0–99)
Triglycerides: 100 mg/dL (ref 0–149)
VLDL Cholesterol Cal: 20 mg/dL (ref 5–40)

## 2011-11-19 LAB — BASIC METABOLIC PANEL
BUN/Creatinine Ratio: 26 — ABNORMAL HIGH (ref 10–22)
BUN: 21 mg/dL (ref 8–27)
CO2: 21 mmol/L (ref 19–28)
Calcium: 9.2 mg/dL (ref 8.6–10.2)
Chloride: 101 mmol/L (ref 97–108)
Creatinine, Ser: 0.81 mg/dL (ref 0.76–1.27)
GFR calc Af Amer: 104 mL/min/{1.73_m2} (ref >59)
GFR calc non Af Amer: 90 mL/min/{1.73_m2} (ref >59)
Glucose: 75 mg/dL (ref 65–99)
Potassium: 3.9 mmol/L (ref 3.5–5.2)
Sodium: 139 mmol/L (ref 134–144)

## 2011-11-19 LAB — HEPATIC FUNCTION PANEL
ALT: 10 IU/L (ref 0–44)
AST: 15 IU/L (ref 0–40)
Albumin: 3.9 g/dL (ref 3.5–4.8)
Alkaline Phosphatase: 40 IU/L — ABNORMAL LOW (ref 44–103)
Bilirubin, Direct: 0.1 mg/dL (ref 0.00–0.40)
Total Bilirubin: 0.3 mg/dL (ref 0.0–1.2)
Total Protein: 6.6 g/dL (ref 6.0–8.5)

## 2011-11-28 ENCOUNTER — Encounter (HOSPITAL_COMMUNITY): Payer: Self-pay | Admitting: Adult Health

## 2011-11-28 ENCOUNTER — Emergency Department (HOSPITAL_COMMUNITY)
Admission: EM | Admit: 2011-11-28 | Discharge: 2011-11-28 | Disposition: A | Discharge status: Home / Self Care | Payer: Medicare Other | Attending: Emergency Medicine | Admitting: Emergency Medicine

## 2011-11-28 ENCOUNTER — Emergency Department (HOSPITAL_COMMUNITY): Payer: Medicare Other

## 2011-11-28 DIAGNOSIS — Z7982 Long term (current) use of aspirin: Secondary | ICD-10-CM | POA: Insufficient documentation

## 2011-11-28 DIAGNOSIS — R51 Headache: Secondary | ICD-10-CM

## 2011-11-28 DIAGNOSIS — J329 Chronic sinusitis, unspecified: Secondary | ICD-10-CM | POA: Insufficient documentation

## 2011-11-28 DIAGNOSIS — I10 Essential (primary) hypertension: Secondary | ICD-10-CM | POA: Insufficient documentation

## 2011-11-28 DIAGNOSIS — Z8673 Personal history of transient ischemic attack (TIA), and cerebral infarction without residual deficits: Secondary | ICD-10-CM | POA: Insufficient documentation

## 2011-11-28 DIAGNOSIS — Z79899 Other long term (current) drug therapy: Secondary | ICD-10-CM | POA: Insufficient documentation

## 2011-11-28 HISTORY — DX: Transient cerebral ischemic attack, unspecified: G45.9

## 2011-11-28 LAB — GLUCOSE, CAPILLARY: Glucose-Capillary: 181 mg/dL — ABNORMAL HIGH (ref 70–99)

## 2011-11-28 MED ORDER — TRAMADOL HCL 50 MG PO TABS: ORAL_TABLET | ORAL | Status: DC

## 2011-11-28 NOTE — ED Provider Notes (Cosign Needed)
History  This chart was scribed for Ward Givens, MD by Bennett Scrape. This patient was seen in room TR06C/TR06C and the patient's care was started at 6:29PM.  CSN: [id number]  Arrival date & time 11/28/11  1449   First MD Initiated Contact with Patient 11/28/11 1829      Chief Complaint  Patient presents with  . Headache     The history is provided by the patient. No language interpreter was used.    Alexis Griffith is a 71 y.o. male who presents to the Emergency Department complaining of left-sided sinus pressure with associated clear rhinorrhea, mild cough and frontal HA around his left orbital that radiates around to the back. He states that he was seen at [facility] 3 weeks ago and received Flonase and z-pac with no improvement. He states that he followed up with Dr Jeannetta Nap and started Levaquin 2 days ago and prednisone yesterday. He reports that his symptoms have improved during his wait in the ED and he denies HA currently. He denies fever, nausea, emesis, and visual disturbances as associated symptoms. He is concerned about his headache with his aggrenox. He is currently taking aggrenox for a TIA that occurred in 2008 and reports that he had squamous cell carcinoma on his tongue removed in September 2012. He denies having any post-surgery complications  PCP Dr Neale Burly [facility]  Past Medical History  Diagnosis Date  . PVC (premature ventricular contraction)   . Hypertension   . Hyperlipidemia   . Anxiety   . Sinus bradycardia on ECG   . 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 Annalee Genta.  . Mild aortic stenosis     per echo in 2010  . TIA (transient ischemic attack)     History reviewed. No pertinent past surgical history.  Family History  Problem Relation Age of Onset  . Heart disease Mother   . Heart attack Mother   . Heart disease Father   . Heart attack Father     History  Substance Use Topics  .  Smoking status: Former Smoker    Quit date: 06/25/1975  . Smokeless tobacco: Not on file  . Alcohol Use: No   Lives at home    Review of Systems  All other systems reviewed and are negative.    Allergies  Fenofibrate; Lisinopril; Macrodantin; Penicillins; Sulfa drugs cross reactors; and Zithromax  Home Medications   Current Outpatient Rx  Name Route Sig Dispense Refill  . BUPROPION HCL ER (SR) 150 MG PO TB12 Oral Take 150 mg by mouth daily.      . CHOLINE FENOFIBRATE 135 MG PO CPDR Oral Take 135 mg by mouth daily.      Marland Kitchen CLONAZEPAM 0.5 MG PO TABS Oral Take 0.5 mg by mouth 4 (four) times daily.      . ASPIRIN-DIPYRIDAMOLE ER 25-200 MG PO CP12 Oral Take 1 capsule by mouth 2 (two) times daily.      Marland Kitchen DOXAZOSIN MESYLATE 4 MG PO TABS Oral Take 4 mg by mouth at bedtime.      Marland Kitchen GLIMEPIRIDE 4 MG PO TABS Oral Take 4 mg by mouth daily before breakfast.      . LEVOFLOXACIN 750 MG PO TABS Oral Take 750 mg by mouth daily.    Marland Kitchen METFORMIN HCL 500 MG PO TABS Oral Take 500 mg by mouth 2 (two) times daily with a meal.      . METOPROLOL  TARTRATE 50 MG PO TABS Oral Take 1 tablet (50 mg total) by mouth 2 (two) times daily as needed. 60 tablet 5  . PREDNISONE 10 MG PO TABS Oral Take 60 mg by mouth daily. Take for 10 deays      Triage Vitals: BP 129/63  Pulse 75  Temp 98.1 F (36.7 C) (Oral)  Resp 20  SpO2 96%  Vital signs normal    Physical Exam  Nursing note and vitals reviewed. Constitutional: He is oriented to person, place, and time. He appears well-developed and well-nourished. No distress.  HENT:  Head: Normocephalic and atraumatic.  Right Ear: External ear normal.  Left Ear: External ear normal.  Mouth/Throat: Oropharynx is clear and moist.       Left turbinates are boggy and enlarged, no drainage noted, no-tender frontal and maxillary sinuses bilaterally  Eyes: Conjunctivae normal and EOM are normal. Pupils are equal, round, and reactive to light.  Neck: Normal range of motion.  Neck supple. No tracheal deviation present.  Cardiovascular: Normal rate, regular rhythm and normal heart sounds.  Exam reveals no friction rub.   No murmur heard. Pulmonary/Chest: Effort normal and breath sounds normal. No respiratory distress. He has no wheezes. He has no rales.  Musculoskeletal: Normal range of motion.  Neurological: He is alert and oriented to person, place, and time.  Skin: Skin is warm and dry.  Psychiatric: He has a normal mood and affect. His behavior is normal.    ED Course  Procedures (including critical care time)  DIAGNOSTIC STUDIES: Oxygen Saturation is 96% on room air, adequate by my interpretation.    COORDINATION OF CARE: 7:02PM-Informed pt of negative CT scan and pt acknowledged results. Discussed treatment plan with pt at bedside and pt agreed to plan.  CBG 181 mild hyperglycemia   Ct Head Wo Contrast  11/28/2011  *RADIOLOGY REPORT*  Clinical Data: Headache  CT HEAD WITHOUT CONTRAST  Technique:  Contiguous axial images were obtained from the base of the skull through the vertex without contrast.  Comparison: Brain MRI 04/26/2008  Findings: No acute intracranial hemorrhage.  No focal mass lesion. No CT evidence of acute infarction.   No midline shift or mass effect.  No hydrocephalus.  Basilar cisterns are patent. No acute cardiopulmonary process.  IMPRESSION: No acute intracranial findings.   Original Report Authenticated By: Genevive Bi, M.D.      1. Headache   2. Sinusitis       MDM    I personally performed the services described in this documentation, which was scribed in my presence. The recorded information has been reviewed and considered.  Devoria Albe, MD, Armando Gang    Ward Givens, MD 11/28/11 1929

## 2011-11-28 NOTE — ED Notes (Signed)
Patient reports he has been treated for sinus infection 3 weeks ago,  He had onset of pressure in his head 3 weeks ago.  Patient reports no changes with zpack.  He has recently been started on levaquin w/o relief.  He also started on prednisone today.  Patient concerned about headache that has not resolved.  He states he is also having pressure in posterior head and into the left side of his neck.  Patient states his pain has decreased some since coming to Ed.  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

## 2011-11-28 NOTE — ED Notes (Addendum)
Treated for Sinusitis for 2 weeks, took a z pack and now taking levaquin and prednisone. Pt has taken the levaquin for 2 days and the prednisone for one. PT is concerned because he has a hx of TIA and has begun to have occipital head pain that radiates into neck and is progressively worsening, denies ear pain. sinus pain has improved. No neuro deficits, alert and oriented and maex4. No droop, no drift. Head pain is worse with lying flat and sleeping.  Pt seeing [facility].  Pt is requesting CT scan.  On blood thinner.

## 2011-12-02 ENCOUNTER — Encounter: Payer: Self-pay | Admitting: Family Medicine

## 2011-12-30 ENCOUNTER — Observation Stay (HOSPITAL_COMMUNITY): Payer: Medicare Other

## 2011-12-30 ENCOUNTER — Encounter (HOSPITAL_COMMUNITY): Payer: Self-pay

## 2011-12-30 ENCOUNTER — Emergency Department (HOSPITAL_COMMUNITY): Payer: Medicare Other

## 2011-12-30 ENCOUNTER — Observation Stay (HOSPITAL_COMMUNITY)
Admission: EM | Admit: 2011-12-30 | Discharge: 2011-12-31 | Disposition: A | Discharge status: Home / Self Care | Payer: Medicare Other | Attending: Emergency Medicine | Admitting: Emergency Medicine

## 2011-12-30 ENCOUNTER — Other Ambulatory Visit: Payer: Self-pay | Admitting: Otolaryngology

## 2011-12-30 DIAGNOSIS — I350 Nonrheumatic aortic (valve) stenosis: Secondary | ICD-10-CM

## 2011-12-30 DIAGNOSIS — I6529 Occlusion and stenosis of unspecified carotid artery: Principal | ICD-10-CM | POA: Insufficient documentation

## 2011-12-30 DIAGNOSIS — E119 Type 2 diabetes mellitus without complications: Secondary | ICD-10-CM | POA: Insufficient documentation

## 2011-12-30 DIAGNOSIS — I359 Nonrheumatic aortic valve disorder, unspecified: Secondary | ICD-10-CM | POA: Insufficient documentation

## 2011-12-30 DIAGNOSIS — I779 Disorder of arteries and arterioles, unspecified: Secondary | ICD-10-CM

## 2011-12-30 DIAGNOSIS — Z7982 Long term (current) use of aspirin: Secondary | ICD-10-CM | POA: Insufficient documentation

## 2011-12-30 DIAGNOSIS — F411 Generalized anxiety disorder: Secondary | ICD-10-CM | POA: Insufficient documentation

## 2011-12-30 DIAGNOSIS — Z9889 Other specified postprocedural states: Secondary | ICD-10-CM | POA: Insufficient documentation

## 2011-12-30 DIAGNOSIS — Z79899 Other long term (current) drug therapy: Secondary | ICD-10-CM | POA: Insufficient documentation

## 2011-12-30 DIAGNOSIS — Z85810 Personal history of malignant neoplasm of tongue: Secondary | ICD-10-CM | POA: Insufficient documentation

## 2011-12-30 DIAGNOSIS — I10 Essential (primary) hypertension: Secondary | ICD-10-CM

## 2011-12-30 DIAGNOSIS — E785 Hyperlipidemia, unspecified: Secondary | ICD-10-CM

## 2011-12-30 HISTORY — DX: Type 2 diabetes mellitus without complications: E11.9

## 2011-12-30 LAB — CBC
HCT: 33 % — ABNORMAL LOW (ref 39.0–52.0)
Hemoglobin: 10.9 g/dL — ABNORMAL LOW (ref 13.0–17.0)
MCH: 26.1 pg (ref 26.0–34.0)
MCHC: 33 g/dL (ref 30.0–36.0)
MCV: 78.9 fL (ref 78.0–100.0)
Platelets: 214 10*3/uL (ref 150–400)
RBC: 4.18 MIL/uL — ABNORMAL LOW (ref 4.22–5.81)
RDW: 14.2 % (ref 11.5–15.5)
WBC: 6.2 10*3/uL (ref 4.0–10.5)

## 2011-12-30 LAB — COMPREHENSIVE METABOLIC PANEL
ALT: 10 U/L (ref 0–53)
AST: 12 U/L (ref 0–37)
Albumin: 3.5 g/dL (ref 3.5–5.2)
Alkaline Phosphatase: 44 U/L (ref 39–117)
BUN: 14 mg/dL (ref 6–23)
CO2: 24 mEq/L (ref 19–32)
Calcium: 9.4 mg/dL (ref 8.4–10.5)
Chloride: 102 mEq/L (ref 96–112)
Creatinine, Ser: 0.8 mg/dL (ref 0.50–1.35)
GFR calc Af Amer: >90 mL/min (ref >90)
GFR calc non Af Amer: 88 mL/min — ABNORMAL LOW (ref >90)
Glucose, Bld: 175 mg/dL — ABNORMAL HIGH (ref 70–99)
Potassium: 3.6 mEq/L (ref 3.5–5.1)
Sodium: 137 mEq/L (ref 135–145)
Total Bilirubin: 0.3 mg/dL (ref 0.3–1.2)
Total Protein: 7 g/dL (ref 6.0–8.3)

## 2011-12-30 LAB — DIFFERENTIAL
Basophils Absolute: 0 10*3/uL (ref 0.0–0.1)
Basophils Relative: 0 % (ref 0–1)
Eosinophils Absolute: 0.1 10*3/uL (ref 0.0–0.7)
Eosinophils Relative: 2 % (ref 0–5)
Lymphocytes Relative: 22 % (ref 12–46)
Lymphs Abs: 1.4 10*3/uL (ref 0.7–4.0)
Monocytes Absolute: 0.6 10*3/uL (ref 0.1–1.0)
Monocytes Relative: 10 % (ref 3–12)
Neutro Abs: 4.1 10*3/uL (ref 1.7–7.7)
Neutrophils Relative %: 66 % (ref 43–77)

## 2011-12-30 LAB — GLUCOSE, CAPILLARY: Glucose-Capillary: 170 mg/dL — ABNORMAL HIGH (ref 70–99)

## 2011-12-30 LAB — LIPID PANEL
Cholesterol: 152 mg/dL (ref 0–200)
HDL: 35 mg/dL — ABNORMAL LOW (ref >39)
LDL Cholesterol: 98 mg/dL (ref 0–99)
Total CHOL/HDL Ratio: 4.3 RATIO
Triglycerides: 95 mg/dL (ref <150)
VLDL: 19 mg/dL (ref 0–40)

## 2011-12-30 LAB — POCT I-STAT TROPONIN I: Troponin i, poc: 0.01 ng/mL (ref 0.00–0.08)

## 2011-12-30 LAB — CREATININE, SERUM
Creatinine, Ser: 0.88 mg/dL (ref 0.50–1.35)
GFR calc Af Amer: >90 mL/min (ref >90)
GFR calc non Af Amer: 85 mL/min — ABNORMAL LOW (ref >90)

## 2011-12-30 LAB — TROPONIN I: Troponin I: <0.3 ng/mL (ref <0.30)

## 2011-12-30 LAB — PROTIME-INR
INR: 1.15 (ref 0.00–1.49)
Prothrombin Time: 14.5 seconds (ref 11.6–15.2)

## 2011-12-30 LAB — APTT: aPTT: 52 seconds — ABNORMAL HIGH (ref 24–37)

## 2011-12-30 MED ORDER — DOXAZOSIN MESYLATE 4 MG PO TABS: 4.0000 mg | ORAL_TABLET | Freq: Every day | ORAL | Status: DC

## 2011-12-30 MED ORDER — ASPIRIN 81 MG PO CHEW
324.0000 mg | CHEWABLE_TABLET | Freq: Once | ORAL | Status: AC
  Administered 2011-12-30: 324 mg via ORAL
  Filled 2011-12-30: qty 4

## 2011-12-30 MED ORDER — CLONAZEPAM 0.5 MG PO TABS
0.5000 mg | ORAL_TABLET | Freq: Once | ORAL | Status: AC
  Administered 2011-12-30: 0.5 mg via ORAL
  Filled 2011-12-30: qty 1

## 2011-12-30 MED ORDER — METOPROLOL TARTRATE 25 MG PO TABS
50.0000 mg | ORAL_TABLET | Freq: Two times a day (BID) | ORAL | Status: DC
  Administered 2011-12-30 – 2011-12-31 (×2): 50 mg via ORAL
  Filled 2011-12-30 (×2): qty 2

## 2011-12-30 MED ORDER — BUPROPION HCL ER (SR) 150 MG PO TB12
150.0000 mg | ORAL_TABLET | Freq: Every day | ORAL | Status: DC
  Administered 2011-12-31: 150 mg via ORAL
  Filled 2011-12-30 (×2): qty 1

## 2011-12-30 MED ORDER — ACETAMINOPHEN 325 MG PO TABS: 650.0000 mg | ORAL_TABLET | ORAL | Status: DC | PRN

## 2011-12-30 MED ORDER — HEPARIN SODIUM (PORCINE) 5000 UNIT/ML IJ SOLN
5000.0000 [IU] | Freq: Three times a day (TID) | INTRAMUSCULAR | Status: DC
  Administered 2011-12-30 – 2011-12-31 (×3): 5000 [IU] via SUBCUTANEOUS
  Filled 2011-12-30 (×3): qty 1

## 2011-12-30 NOTE — ED Provider Notes (Signed)
History     CSN: [id number]  Arrival date & time 12/30/11  1406   First MD Initiated Contact with Patient 12/30/11 1921      Chief Complaint  Patient presents with  . Transient Ischemic Attack    (Consider location/radiation/quality/duration/timing/severity/associated sxs/prior treatment) HPI  71 year old male with history of anxiety, hypertension, and TIA presents complaining of disequilibrium. Patient reports he has been having headache for several months and was recently suspect that he may have giant cells arthritis. He had a biopsy performed this a.m. to his left temple. Patient went home, and while walking around the house patient felt imbalance, and states he keeps leaning towards his right side. He was walking down the hall, lost balance and fell onto his chair. He did not hit his head or loss of consciousness. He was able to get up. Denies any significant pain from the fall, however the symptom worries him and prompted him to come to ER for further evaluation. Patient reports he imbalance sensation lasting for several minutes, improved, and subsequently returned again. He is currently symptom free. He endorsed a mild headache, that is chronic. Denies chest pain, shortness of breath, back pain, numbness or weakness. Denies dysuria. Patient reports he has been diagnosed with TIA in 2003. He also has history of mild aortic stenosis, currently being followed by Dr. love, neurologist.    Past Medical History  Diagnosis Date  . PVC (premature ventricular contraction)   . Hypertension   . Hyperlipidemia   . Anxiety   . Sinus bradycardia on ECG   . 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 Annalee Genta.  . Mild aortic stenosis     per echo in 2010  . TIA (transient ischemic attack)     No past surgical history on file.  Family History  Problem Relation Age of Onset  . Heart disease Mother   . Heart attack Mother   . Heart disease Father   . Heart  attack Father     History  Substance Use Topics  . Smoking status: Former Smoker    Quit date: 06/25/1975  . Smokeless tobacco: Not on file  . Alcohol Use: No      Review of Systems  All other systems reviewed and are negative.    Allergies  Fenofibrate; Lisinopril; Macrodantin; Penicillins; Sulfa drugs cross reactors; and Zithromax  Home Medications   Current Outpatient Rx  Name Route Sig Dispense Refill  . BUPROPION HCL ER (SR) 150 MG PO TB12 Oral Take 150 mg by mouth daily.      . CHOLINE FENOFIBRATE 135 MG PO CPDR Oral Take 135 mg by mouth daily.      Marland Kitchen CLONAZEPAM 0.5 MG PO TABS Oral Take 0.5 mg by mouth 4 (four) times daily.      . ASPIRIN-DIPYRIDAMOLE ER 25-200 MG PO CP12 Oral Take 1 capsule by mouth 2 (two) times daily.      Marland Kitchen DOXAZOSIN MESYLATE 4 MG PO TABS Oral Take 4 mg by mouth at bedtime.      Marland Kitchen GLIMEPIRIDE 4 MG PO TABS Oral Take 4 mg by mouth daily before breakfast.      . METFORMIN HCL 500 MG PO TABS Oral Take 500 mg by mouth 2 (two) times daily with a meal.      . METOPROLOL TARTRATE 50 MG PO TABS Oral Take 50 mg by mouth 2 (two) times daily as needed. For blood pressure      BP  120/59  Pulse 66  Resp 12  SpO2 98%  Physical Exam  Nursing note and vitals reviewed. Constitutional: He is oriented to person, place, and time. He appears well-developed and well-nourished. No distress.       Awake, alert, nontoxic appearance  HENT:  Head: Atraumatic.  Right Ear: External ear normal.  Left Ear: External ear normal.  Mouth/Throat: Oropharynx is clear and moist.       Surgical biopsy site noted to left temporal region without evidence of infection. Nontender on palpation.  No temporal bruit on exam  Eyes: Conjunctivae normal and EOM are normal. Pupils are equal, round, and reactive to light. Right eye exhibits no discharge. Left eye exhibits no discharge.       No nystagmus noted  Neck: Normal range of motion. Neck supple.       No carotid bruit noted    Cardiovascular: Normal rate and regular rhythm.   Murmur (3 out 6 systolic murmur best heard at the second and third intercostal space radiating to left axillary) heard. Pulmonary/Chest: Effort normal. No respiratory distress. He has no wheezes. He has no rales. He exhibits no tenderness.  Abdominal: Soft. There is no tenderness. There is no rebound.  Musculoskeletal: Normal range of motion. He exhibits no edema and no tenderness.       ROM appears intact, no obvious focal weakness  Neurological: He is alert and oriented to person, place, and time. He displays normal reflexes. No cranial nerve deficit. He exhibits normal muscle tone. Coordination normal.  Skin: Skin is warm and dry. No rash noted.  Psychiatric: He has a normal mood and affect.    ED Course  Procedures (including critical care time)  Labs Reviewed  APTT - Abnormal; Notable for the following:    aPTT 52 (*)     All other components within normal limits  CBC - Abnormal; Notable for the following:    RBC 4.18 (*)     Hemoglobin 10.9 (*)     HCT 33.0 (*)     All other components within normal limits  COMPREHENSIVE METABOLIC PANEL - Abnormal; Notable for the following:    Glucose, Bld 175 (*)     GFR calc non Af Amer 88 (*)     All other components within normal limits  GLUCOSE, CAPILLARY - Abnormal; Notable for the following:    Glucose-Capillary 170 (*)     All other components within normal limits  PROTIME-INR  DIFFERENTIAL  TROPONIN I  POCT I-STAT TROPONIN I   Results for orders placed during the hospital encounter of 12/30/11  PROTIME-INR      Component Value Range   Prothrombin Time 14.5  11.6 - 15.2 seconds   INR 1.15  0.00 - 1.49  APTT      Component Value Range   aPTT 52 (*) 24 - 37 seconds  CBC      Component Value Range   WBC 6.2  4.0 - 10.5 K/uL   RBC 4.18 (*) 4.22 - 5.81 MIL/uL   Hemoglobin 10.9 (*) 13.0 - 17.0 g/dL   HCT 16.1 (*) 09.6 - 04.5 %   MCV 78.9  78.0 - 100.0 fL   MCH 26.1  26.0 -  34.0 pg   MCHC 33.0  30.0 - 36.0 g/dL   RDW 40.9  81.1 - 91.4 %   Platelets 214  150 - 400 K/uL  DIFFERENTIAL      Component Value Range   Neutrophils Relative 66  43 -  77 %   Neutro Abs 4.1  1.7 - 7.7 K/uL   Lymphocytes Relative 22  12 - 46 %   Lymphs Abs 1.4  0.7 - 4.0 K/uL   Monocytes Relative 10  3 - 12 %   Monocytes Absolute 0.6  0.1 - 1.0 K/uL   Eosinophils Relative 2  0 - 5 %   Eosinophils Absolute 0.1  0.0 - 0.7 K/uL   Basophils Relative 0  0 - 1 %   Basophils Absolute 0.0  0.0 - 0.1 K/uL  COMPREHENSIVE METABOLIC PANEL      Component Value Range   Sodium 137  135 - 145 mEq/L   Potassium 3.6  3.5 - 5.1 mEq/L   Chloride 102  96 - 112 mEq/L   CO2 24  19 - 32 mEq/L   Glucose, Bld 175 (*) 70 - 99 mg/dL   BUN 14  6 - 23 mg/dL   Creatinine, Ser 1.61  0.50 - 1.35 mg/dL   Calcium 9.4  8.4 - 09.6 mg/dL   Total Protein 7.0  6.0 - 8.3 g/dL   Albumin 3.5  3.5 - 5.2 g/dL   AST 12  0 - 37 U/L   ALT 10  0 - 53 U/L   Alkaline Phosphatase 44  39 - 117 U/L   Total Bilirubin 0.3  0.3 - 1.2 mg/dL   GFR calc non Af Amer 88 (*) >90 mL/min   GFR calc Af Amer >90  >90 mL/min  TROPONIN I      Component Value Range   Troponin I <0.30  <0.30 ng/mL  GLUCOSE, CAPILLARY      Component Value Range   Glucose-Capillary 170 (*) 70 - 99 mg/dL   Comment 1 Notify RN    POCT I-STAT TROPONIN I      Component Value Range   Troponin i, poc 0.01  0.00 - 0.08 ng/mL   Comment 3            Ct Head Wo Contrast  12/30/2011  *RADIOLOGY REPORT*  Clinical Data: Falling.  Loss of balance.  TIA versus stroke. Biopsy today above the left ear  CT HEAD WITHOUT CONTRAST  Technique:  Contiguous axial images were obtained from the base of the skull through the vertex without contrast.  Comparison: 11/28/2011  Findings: The brain shows generalized atrophy.  There is no sign of focal infarction either old or acute.  No mass lesion, hemorrhage, hydrocephalus or extra-axial collection.  Sinuses, middle ears and mastoids  are clear. Scans staple is present in the left side of the scalp.  No underlying calvarial abnormality or unexpected finding.  IMPRESSION: Mild age related atrophy.  No focal or acute intracranial finding. Changes related to superficial biopsy above and anterior to the left ear.   Original Report Authenticated By: Thomasenia Sales, M.D.     1. tia  MDM  Patient presents with TIA symptoms of abnormal gait, currently symptom free. Aspirin given. He has a ABCD2 score of 6 with which put him at high risk.  I discussed care with my attending, and to obtain head CT scan and place patient on TIA protocol for further evaluation.    9:10 PM I have consulted with Robyn, [facility] who will continue TIA protocol in CDU.  Pt aware of plan.  My attending is aware of plan.     BP 119/54  Pulse 74  Temp 98.9 F (37.2 C) (Oral)  Resp 18  SpO2 97%  I have  reviewed nursing notes and vital signs. I personally reviewed the imaging tests through PACS system  I reviewed available ER/hospitalization records thought the EMR    Fayrene Helper, PA-C 12/30/11 2325

## 2011-12-30 NOTE — ED Notes (Signed)
B. Tran, PA at pt bedside at this time. 

## 2011-12-30 NOTE — ED Provider Notes (Signed)
Medical screening examination/treatment/procedure(s) were performed by non-physician practitioner and as supervising physician I was immediately available for consultation/collaboration.   Lyanne Co, MD 12/30/11 2352

## 2011-12-30 NOTE — ED Provider Notes (Signed)
71 year old male moved to the CDU on TIA protocol. He is currently asymptomatic. 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 He is not sure if that is both causing his symptoms or if he had a TIA. He has had a TIA in the past. On exam patient is resting comfortably in bed and in no apparent distress.  Head is normocephalic, atraumatic. Eyes PERRLA, EOMI, conjunctiva normal. No cranial nerve deficits. Heart RRR with a 3/6 soft blowing systolic murmur. Lungs CTA. AAO x3. He has a normal mood and affect and his speech is normal. 11:52 PM Case discussed with Dr. Hyacinth Meeker who will take over care of patient at this time.  Trevor Mace, PA-C 12/30/11 2352

## 2011-12-30 NOTE — ED Notes (Signed)
sts he thinks he has a tia, pt had dermatology/ent procedure this am and reprots he may have had a tia, at home his gait became off balance and he was wobbling and all over the place. Pt with hx of tia in past and sees dr love.

## 2011-12-31 ENCOUNTER — Encounter (HOSPITAL_COMMUNITY): Payer: Self-pay

## 2011-12-31 ENCOUNTER — Observation Stay (HOSPITAL_COMMUNITY): Payer: Medicare Other

## 2011-12-31 DIAGNOSIS — I779 Disorder of arteries and arterioles, unspecified: Secondary | ICD-10-CM

## 2011-12-31 DIAGNOSIS — I517 Cardiomegaly: Secondary | ICD-10-CM

## 2011-12-31 LAB — HEMOGLOBIN A1C
Hgb A1c MFr Bld: 5.6 % (ref <5.7)
Mean Plasma Glucose: 114 mg/dL (ref <117)

## 2011-12-31 LAB — GLUCOSE, CAPILLARY: Glucose-Capillary: 116 mg/dL — ABNORMAL HIGH (ref 70–99)

## 2011-12-31 MED ORDER — GLIMEPIRIDE 4 MG PO TABS
4.0000 mg | ORAL_TABLET | Freq: Every day | ORAL | Status: DC
  Filled 2011-12-31: qty 1

## 2011-12-31 MED ORDER — PREDNISONE 20 MG PO TABS: 60.0000 mg | ORAL_TABLET | Freq: Every day | ORAL | Status: DC

## 2011-12-31 MED ORDER — CLONAZEPAM 0.5 MG PO TABS
0.5000 mg | ORAL_TABLET | Freq: Four times a day (QID) | ORAL | Status: DC
  Administered 2011-12-31: 0.5 mg via ORAL
  Filled 2011-12-31: qty 1

## 2011-12-31 MED ORDER — METFORMIN HCL 500 MG PO TABS: 500.0000 mg | ORAL_TABLET | Freq: Two times a day (BID) | ORAL | Status: DC

## 2011-12-31 MED ORDER — IOHEXOL 350 MG/ML SOLN
75.0000 mL | Freq: Once | INTRAVENOUS | Status: AC | PRN
  Administered 2011-12-31: 75 mL via INTRAVENOUS

## 2011-12-31 MED ORDER — ASPIRIN-DIPYRIDAMOLE ER 25-200 MG PO CP12
1.0000 | ORAL_CAPSULE | Freq: Two times a day (BID) | ORAL | Status: DC
  Administered 2011-12-31: 1 via ORAL
  Filled 2011-12-31 (×3): qty 1

## 2011-12-31 NOTE — ED Provider Notes (Signed)
Medical screening examination/treatment/procedure(s) were performed by non-physician practitioner and as supervising physician I was immediately available for consultation/collaboration.   Lyanne Co, MD 12/31/11 [phone]

## 2011-12-31 NOTE — ED Notes (Signed)
Pt states he did not go to MRI because he has staples in the left temporal side of his head

## 2011-12-31 NOTE — ED Notes (Signed)
Pt states

## 2011-12-31 NOTE — ED Notes (Signed)
Pt resting in stretcher.  NP in to see pt.  Pt denies any needs at this time.

## 2011-12-31 NOTE — ED Notes (Signed)
Pt. Did not like his breakfast.  Appoligized to pt. And ordered a lunch, which pt. Requested.  Pt. Refused any crackers prior to his tray coming.  Pt. Given cranberry juice

## 2011-12-31 NOTE — ED Notes (Addendum)
Pt reports having a headache. Pt states he has chronic headaches with a 4-5/10 pain. Pt states the headache he has right now are not in anyway different than his usual headaches. Pt's neuro status WDL

## 2011-12-31 NOTE — ED Notes (Signed)
Patient called his wife to pick him up from hospital.

## 2011-12-31 NOTE — Progress Notes (Signed)
Utilization review completed.  

## 2011-12-31 NOTE — Progress Notes (Signed)
  Echocardiogram 2D Echocardiogram has been performed.  Alexis Griffith 12/31/2011, 11:29 AM

## 2011-12-31 NOTE — ED Provider Notes (Signed)
The patient has been on TIA protocol waiting for MRI, echo and carotid dopplers for study. MRI not able to be completed as patient has surgical staples in left temporal region after temporal artery biopsy done yesterday. He is asymptomatic. Ambulatory without ataxia. Discussed with Dr. Amada Jupiter with neurology who will see and evaluate patient in CDU.  1:30 - patient seen and evaluated by neurology. CT angio was ordered and, if negative, patient can be discharged home. If negative, Dr. Amada Jupiter recommends starting Prednisone again. Discussed metformin dosage given contrasted study as well as concern for placing the patient back on Prednisone at 60 mg daily in regard to his blood sugar control with Dr. Evlyn Kanner. He recommends stopping metformin for the standard 48 hour period. They will see him in the office tomorrow to manage his diabetes from there. CT pending at this time.    Rodena Medin, PA-C 12/31/11 1506  Discussed results of CTA with Dr. 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 He feels he did have a TIA but resolution of symptoms infers that stroke is unlikely. He can be discharged home to follow up with Dr. Sandria Manly.  Rodena Medin, PA-C 12/31/11 [phone]

## 2011-12-31 NOTE — Progress Notes (Addendum)
VASCULAR LAB PRELIMINARY  PRELIMINARY  PRELIMINARY  PRELIMINARY  Carotid duplex completed.   Preliminary report:  Right -  60-79% internal carotid artery stenosis mid range. Left :  40-59% internal carotid artery stenosis lower end of scale. There is no significant change since study of 08/14/11 at another facility. Vertebral artery flow is antegrade bilaterally.  Maylene Crocker, RVS 12/31/2011, 8:56 AM

## 2011-12-31 NOTE — Consult Note (Addendum)
NEURO HOSPITALIST CONSULT NOTE    Reason for Consult:  Transient Ataxia  HPI:                                                                                                                                          Alexis Griffith is an 71 y.o. male who has been seen by Dr. Jeannetta Nap and Dr. Sandria Manly (neurology).  He has been having 1+ years of overall weakness, generalized skin sensitivity and myalgias.  He describes his HA as skin sensitivity all over his head and pain on both sides of his head.  Labs were drawn by Dr. Jeannetta Nap (patient is unsure what they were) which made Dr. Jeannetta Nap think Prednisone may help in addition, Dr. Jeannetta Nap believed he may have temporal arteritis.  He was placed on Prednisone by Dr. Jeannetta Nap which made him feel significantly better.  He then saw Dr. Sandria Manly who did not believe this was Temporal arteritis (per patient) but appointment for Bx had been made. HE ws taken of Prednisone prior to Bx.  Patient noted his symptoms quickly returned.  Patient went for Bx yesterday.  After returning home he noted a transient (2 minute) episode in which he kept veering to the right. HE did fall at one point but "not hard".  He has not had any other issues. Of note: He did have a episode back in 2003 which was very similar. At that time a stroke workup was done showing mild Carotid stenosis but no other risk factors.  That is when he was switched from Plavix to Aggrenox. Currently he is having no symptoms.  Past Medical History  Diagnosis Date  . PVC (premature ventricular contraction)   . Hypertension   . Hyperlipidemia   . Anxiety   . Sinus bradycardia on ECG   . 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 Annalee Genta.  . Mild aortic stenosis     per echo in 2010  . TIA (transient ischemic attack)   . Diabetes mellitus without complication     No past surgical history on file.  Family History  Problem Relation Age of Onset  . Heart disease Mother    . Heart attack Mother   . Heart disease Father   . Heart attack Father     Social History:  reports that he quit smoking about 36 years ago. He does not have any smokeless tobacco history on file. He reports that he does not drink alcohol or use illicit drugs.  Allergies  Allergen Reactions  . Fenofibrate     Unknown  . Lisinopril     Cough   . Macrodantin     Unknown  . Penicillins     Unknown  . Sulfa Drugs Cross Reactors  Unknown  . Zithromax (Azithromycin Dihydrate)     Unknown    MEDICATIONS:                                                                                                                     Current Facility-Administered Medications  Medication Dose Route Frequency Provider Last Rate Last Dose  . acetaminophen (TYLENOL) tablet 650 mg  650 mg Oral Q4H PRN Fayrene Helper, PA-C      . aspirin chewable tablet 324 mg  324 mg Oral Once Fayrene Helper, PA-C   324 mg at 12/30/11 2131  . buPROPion [facility] SR) 12 hr tablet 150 mg  150 mg Oral Daily Fayrene Helper, PA-C      . clonazePAM Scarlette Calico) tablet 0.5 mg  0.5 mg Oral Once Trevor Mace, PA-C   0.5 mg at 12/30/11 2345  . clonazePAM (KLONOPIN) tablet 0.5 mg  0.5 mg Oral QID Rodena Medin, PA-C      . dipyridamole-aspirin (AGGRENOX) 200-25 MG per 12 hr capsule 1 capsule  1 capsule Oral BID Rodena Medin, PA-C      . doxazosin (CARDURA) tablet 4 mg  4 mg Oral QHS Fayrene Helper, PA-C      . glimepiride (AMARYL) tablet 4 mg  4 mg Oral QAC breakfast Rodena Medin, PA-C      . heparin injection 5,000 Units  5,000 Units Subcutaneous Q8H Fayrene Helper, PA-C   5,000 Units at 12/31/11 0865  . metFORMIN (GLUCOPHAGE) tablet 500 mg  500 mg Oral BID WC Rodena Medin, PA-C      . metoprolol tartrate (LOPRESSOR) tablet 50 mg  50 mg Oral BID Fayrene Helper, PA-C   50 mg at 12/31/11 1007   Current Outpatient Prescriptions  Medication Sig Dispense Refill  . buPROPion (WELLBUTRIN SR) 150 MG 12 hr tablet Take 150 mg by mouth daily.         . Choline Fenofibrate (TRILIPIX) 135 MG capsule Take 135 mg by mouth daily.        . clonazePAM (KLONOPIN) 0.5 MG tablet Take 0.5 mg by mouth 4 (four) times daily.        Marland Kitchen dipyridamole-aspirin (AGGRENOX) 25-200 MG per 12 hr capsule Take 1 capsule by mouth 2 (two) times daily.        Marland Kitchen doxazosin (CARDURA) 4 MG tablet Take 4 mg by mouth at bedtime.        Marland Kitchen glimepiride (AMARYL) 4 MG tablet Take 4 mg by mouth daily before breakfast.        . metFORMIN (GLUCOPHAGE) 500 MG tablet Take 500 mg by mouth 2 (two) times daily with a meal.        . metoprolol (LOPRESSOR) 50 MG tablet Take 50 mg by mouth 2 (two) times daily as needed. For blood pressure         ROS:  History obtained from the patient  General ROS: negative for - chills, fatigue, fever, night sweats, weight gain or weight loss Psychological ROS: negative for - behavioral disorder, hallucinations, memory difficulties, mood swings or suicidal ideation Ophthalmic ROS: negative for - blurry vision, double vision, eye pain or loss of vision ENT ROS: negative for - epistaxis, nasal discharge, oral lesions, sore throat, tinnitus or vertigo Allergy and Immunology ROS: negative for - hives or itchy/watery eyes Hematological and Lymphatic ROS: negative for - bleeding problems, bruising or swollen lymph nodes Endocrine ROS: negative for - galactorrhea, hair pattern changes, polydipsia/polyuria or temperature intolerance Respiratory ROS: negative for - cough, hemoptysis, shortness of breath or wheezing Cardiovascular ROS: negative for - chest pain, dyspnea on exertion, edema or irregular heartbeat Gastrointestinal ROS: negative for - abdominal pain, diarrhea, hematemesis, nausea/vomiting or stool incontinence Genito-Urinary ROS: negative for - dysuria, hematuria, incontinence or urinary  frequency/urgency Musculoskeletal ROS: as noted in HPI Neurological ROS: as noted in HPI Dermatological ROS: negative for rash and skin lesion changes   Blood pressure 139/66, pulse 68, temperature 98.6 F (37 C), temperature source Oral, resp. rate 19, SpO2 96.00%.   Neurologic Examination:                                                                                                       Mental Status: Alert, oriented, thought content appropriate.  Speech fluent without evidence of aphasia.  Able to follow 3 step commands without difficulty. Cranial Nerves: II: Discs flat bilaterally; Visual fields grossly normal, pupils equal, round, reactive to light and accommodation III,IV, VI: ptosis not present, extra-ocular motions intact bilaterally V,VII: smile symmetric, facial light touch sensation normal bilaterally VIII: hearing normal bilaterally IX,X: gag reflex present XI: bilateral shoulder shrug XII: midline tongue extension Motor: Right : Upper extremity   5/5    Left:     Upper extremity   5/5  Lower extremity   5/5     Lower extremity   5/5 Tone and bulk:normal tone throughout; no atrophy noted Sensory: Pinprick and light touch intact throughout, bilaterally Deep Tendon Reflexes: 2+ and symmetric throughout Plantars: Right: downgoing   Left: downgoing Cerebellar: normal finger-to-nose,  normal heel-to-shin test Gait: normal CV: pulses palpable throughout    Lab Results  Component Value Date/Time   CHOL 152 12/30/2011  9:07 PM    Results for orders placed during the hospital encounter of 12/30/11 (from the past 48 hour(s))  PROTIME-INR     Status: Normal   Collection Time   12/30/11  2:16 PM      Component Value Range Comment   Prothrombin Time 14.5  11.6 - 15.2 seconds    INR 1.15  0.00 - 1.49   APTT     Status: Abnormal   Collection Time   12/30/11  2:16 PM      Component Value Range Comment   aPTT 52 (*) 24 - 37 seconds   CBC     Status: Abnormal    Collection Time   12/30/11  2:16 PM      Component  Value Range Comment   WBC 6.2  4.0 - 10.5 K/uL    RBC 4.18 (*) 4.22 - 5.81 MIL/uL    Hemoglobin 10.9 (*) 13.0 - 17.0 g/dL    HCT 11.9 (*) 14.7 - 52.0 %    MCV 78.9  78.0 - 100.0 fL    MCH 26.1  26.0 - 34.0 pg    MCHC 33.0  30.0 - 36.0 g/dL    RDW 82.9  56.2 - 13.0 %    Platelets 214  150 - 400 K/uL   DIFFERENTIAL     Status: Normal   Collection Time   12/30/11  2:16 PM      Component Value Range Comment   Neutrophils Relative 66  43 - 77 %    Neutro Abs 4.1  1.7 - 7.7 K/uL    Lymphocytes Relative 22  12 - 46 %    Lymphs Abs 1.4  0.7 - 4.0 K/uL    Monocytes Relative 10  3 - 12 %    Monocytes Absolute 0.6  0.1 - 1.0 K/uL    Eosinophils Relative 2  0 - 5 %    Eosinophils Absolute 0.1  0.0 - 0.7 K/uL    Basophils Relative 0  0 - 1 %    Basophils Absolute 0.0  0.0 - 0.1 K/uL   COMPREHENSIVE METABOLIC PANEL     Status: Abnormal   Collection Time   12/30/11  2:16 PM      Component Value Range Comment   Sodium 137  135 - 145 mEq/L    Potassium 3.6  3.5 - 5.1 mEq/L    Chloride 102  96 - 112 mEq/L    CO2 24  19 - 32 mEq/L    Glucose, Bld 175 (*) 70 - 99 mg/dL    BUN 14  6 - 23 mg/dL    Creatinine, Ser 8.65  0.50 - 1.35 mg/dL    Calcium 9.4  8.4 - 78.4 mg/dL    Total Protein 7.0  6.0 - 8.3 g/dL    Albumin 3.5  3.5 - 5.2 g/dL    AST 12  0 - 37 U/L    ALT 10  0 - 53 U/L    Alkaline Phosphatase 44  39 - 117 U/L    Total Bilirubin 0.3  0.3 - 1.2 mg/dL    GFR calc non Af Amer 88 (*) >90 mL/min    GFR calc Af Amer >90  >90 mL/min   TROPONIN I     Status: Normal   Collection Time   12/30/11  2:16 PM      Component Value Range Comment   Troponin I <0.30  <0.30 ng/mL   POCT I-STAT TROPONIN I     Status: Normal   Collection Time   12/30/11  2:34 PM      Component Value Range Comment   Troponin i, poc 0.01  0.00 - 0.08 ng/mL    Comment 3            GLUCOSE, CAPILLARY     Status: Abnormal   Collection Time   12/30/11  2:35 PM       Component Value Range Comment   Glucose-Capillary 170 (*) 70 - 99 mg/dL    Comment 1 Notify RN     LIPID PANEL     Status: Abnormal   Collection Time   12/30/11  9:07 PM      Component Value Range Comment   Cholesterol 152  0 - 200 mg/dL    Triglycerides 95  <161 mg/dL    HDL 35 (*) >09 mg/dL    Total CHOL/HDL Ratio 4.3      VLDL 19  0 - 40 mg/dL    LDL Cholesterol 98  0 - 99 mg/dL   HEMOGLOBIN U0A     Status: Normal   Collection Time   12/30/11  9:08 PM      Component Value Range Comment   Hemoglobin A1C 5.6  <5.7 %    Mean Plasma Glucose 114  <117 mg/dL   CREATININE, SERUM     Status: Abnormal   Collection Time   12/30/11  9:08 PM      Component Value Range Comment   Creatinine, Ser 0.88  0.50 - 1.35 mg/dL    GFR calc non Af Amer 85 (*) >90 mL/min    GFR calc Af Amer >90  >90 mL/min   GLUCOSE, CAPILLARY     Status: Abnormal   Collection Time   12/31/11  9:26 AM      Component Value Range Comment   Glucose-Capillary 116 (*) 70 - 99 mg/dL     Ct Head Wo Contrast  12/30/2011  *RADIOLOGY REPORT*  Clinical Data: Falling.  Loss of balance.  TIA versus stroke. Biopsy today above the left ear  CT HEAD WITHOUT CONTRAST  Technique:  Contiguous axial images were obtained from the base of the skull through the vertex without contrast.  Comparison: 11/28/2011  Findings: The brain shows generalized atrophy.  There is no sign of focal infarction either old or acute.  No mass lesion, hemorrhage, hydrocephalus or extra-axial collection.  Sinuses, middle ears and mastoids are clear. Scans staple is present in the left side of the scalp.  No underlying calvarial abnormality or unexpected finding.  IMPRESSION: Mild age related atrophy.  No focal or acute intracranial finding. Changes related to superficial biopsy above and anterior to the left ear.   Original Report Authenticated By: Thomasenia Sales, M.D.    2 D echo: Impressions:  - Normal LV size and systolic function, EF 60-65%.  Moderate diastolic dysfunction. Mild aortic stenosis. Normal RV size and systolic function.  Carotid duplex completed.  Preliminary report:  Right - 60-79% internal carotid artery stenosis mid range  (40-59% in 01/2011).  Left : 40-59% internal carotid artery stenosis lower end of scale (1-39% in 01-2011). There is no significant change since study of 08/14/11 at another facility. Vertebral artery flow is antegrade bilaterally.   Assessment/Plan: 71 YO male with transient ataxia lasting for approximately 5 minutes. His symptoms are more likely related to his posterior circulation and therefore I feel that his carotid stenosis is asymptomatic. He is already on Aggrenox therapy and he will need to go back on medication for cholesterol control given that his current LDL is likely a reflection of when he was taking.  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 However, given the symptoms consistent with temporal arteritis, it would be reasonable to continue prednisone until his biopsy returns.    1) CTA head and neck to both evaluate posterior circulation as well as to look for irregular stenosis of his large vessels suggestive of arteritis.  2) Restart cholesterol treatment that he recently stopped, if myalgias are a concern, he may need different therapy and can discuss this with his PCP.  3) DM control with goal A1C < 7,  he will need to hold his metformin for 48 hours after his CT angiogram and I would discuss interim management either with his PCP or endocrine here for recommendations given that we will be starting prednisone.  4) I would start prednisone 60mg /day until his biopsy results return.  5) Continue aggrenox for anti-platelet therapy. 6) would start omeprazole for GI prophylaxis while on steroids.     Felicie Morn PA-C [facility] [phone]  12/31/2011, 11:36 AM  I have  seen and evaluated the patient. I have reviewed the above note and made appropriate changes.   Ritta Slot, MD [facility] [phone]  If 7pm- 7am, please page neurology on call at [phone].

## 2012-01-02 NOTE — ED Provider Notes (Signed)
Medical screening examination/treatment/procedure(s) were performed by non-physician practitioner and as supervising physician I was immediately available for consultation/collaboration.    Vida Roller, MD 01/02/12 [phone]

## 2012-01-13 ENCOUNTER — Other Ambulatory Visit: Payer: Self-pay | Admitting: Neurology

## 2012-01-13 DIAGNOSIS — M316 Other giant cell arteritis: Secondary | ICD-10-CM

## 2012-01-13 DIAGNOSIS — G459 Transient cerebral ischemic attack, unspecified: Secondary | ICD-10-CM

## 2012-01-14 ENCOUNTER — Other Ambulatory Visit: Payer: Self-pay | Admitting: Neurology

## 2012-01-14 DIAGNOSIS — G459 Transient cerebral ischemic attack, unspecified: Secondary | ICD-10-CM

## 2012-01-14 DIAGNOSIS — M316 Other giant cell arteritis: Secondary | ICD-10-CM

## 2012-01-18 ENCOUNTER — Other Ambulatory Visit: Payer: Medicare Other

## 2012-01-18 ENCOUNTER — Ambulatory Visit
Admission: RE | Admit: 2012-01-18 | Discharge: 2012-01-18 | Disposition: A | Discharge status: Home / Self Care | Payer: Medicare Other | Source: Ambulatory Visit | Attending: Neurology | Admitting: Neurology

## 2012-01-18 DIAGNOSIS — G459 Transient cerebral ischemic attack, unspecified: Secondary | ICD-10-CM

## 2012-01-18 DIAGNOSIS — M316 Other giant cell arteritis: Secondary | ICD-10-CM

## 2012-02-17 ENCOUNTER — Other Ambulatory Visit: Payer: Medicare Other

## 2012-02-17 ENCOUNTER — Ambulatory Visit: Payer: Medicare Other | Admitting: Neurosurgery

## 2012-04-16 ENCOUNTER — Other Ambulatory Visit: Payer: Self-pay

## 2012-05-17 ENCOUNTER — Encounter: Payer: Self-pay | Admitting: Cardiovascular Disease

## 2012-05-17 ENCOUNTER — Ambulatory Visit (INDEPENDENT_AMBULATORY_CARE_PROVIDER_SITE_OTHER): Payer: Medicare Other | Admitting: Cardiovascular Disease

## 2012-05-17 VITALS — BP 130/60 | HR 74 | Ht 70.0 in | Wt 193.2 lb

## 2012-05-17 DIAGNOSIS — E785 Hyperlipidemia, unspecified: Secondary | ICD-10-CM

## 2012-05-17 DIAGNOSIS — M316 Other giant cell arteritis: Secondary | ICD-10-CM

## 2012-05-17 DIAGNOSIS — I359 Nonrheumatic aortic valve disorder, unspecified: Secondary | ICD-10-CM

## 2012-05-17 DIAGNOSIS — I350 Nonrheumatic aortic (valve) stenosis: Secondary | ICD-10-CM

## 2012-05-17 DIAGNOSIS — I10 Essential (primary) hypertension: Secondary | ICD-10-CM

## 2012-05-17 DIAGNOSIS — I779 Disorder of arteries and arterioles, unspecified: Secondary | ICD-10-CM

## 2012-05-17 DIAGNOSIS — I4949 Other premature depolarization: Secondary | ICD-10-CM

## 2012-05-17 DIAGNOSIS — I493 Ventricular premature depolarization: Secondary | ICD-10-CM

## 2012-05-17 HISTORY — DX: Other giant cell arteritis: M31.6

## 2012-05-17 NOTE — Patient Instructions (Addendum)
Your physician has requested that you have a carotid duplex in October 2014. This test is an ultrasound of the carotid arteries in your neck. It looks at blood flow through these arteries that supply the brain with blood. Allow one hour for this exam. There are no restrictions or special instructions.  Your physician wants you to follow-up in: 6 months with Dr. Elease Hashimoto. You will receive a reminder letter in the mail two months in advance. If you don't receive a letter, please call our office to schedule the follow-up appointment.

## 2012-05-17 NOTE — Assessment & Plan Note (Signed)
Phil's BP is well controlled.  Will continue with our current meds

## 2012-05-17 NOTE — Assessment & Plan Note (Addendum)
We'll get a carotid duplexscan  in October of this year.

## 2012-05-17 NOTE — Progress Notes (Signed)
Alexis Griffith Date of Birth  05/16/1940 [facility]     [place] [facility]  [address]    [address]   [address] [place], [place]  [address]    [place], [place]  [address] [phone]  Fax  [phone]  [phone]  Fax [phone]   Problem list: 1. Premature ventricular contractions 2. Anxiety 3. Hypertension 4. Hyperlipidemia 5. 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 Bilateral carotid artery disease - moderate 7. 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 TIA   History of Present Illness:  Alexis Griffith ihas done well.  He has continued to lose weight.  He has been watching his diet.  He is not exercising very much.  He has stopped his Crestor because of muscle aches.  His muscle pains resolved 2 days after he stopped the Crestor.  He had been on the Crestor for 15 years but stopped it several weeks ago because of his muscle  Aches.  May 17, 2012:  Alexis Griffith is doing well.  No cardiac problems.  He is upset about Dr. Katrine Griffith retirement this past weekend.   He will start getting his carotid Duplex with [place].    Current Outpatient Prescriptions on File Prior to Visit  Medication Sig Dispense Refill  . buPROPion (WELLBUTRIN SR) 150 MG 12 hr tablet Take 150 mg by mouth daily.        . Choline Fenofibrate (TRILIPIX) 135 MG capsule Take 135 mg by mouth daily.        . clonazePAM (KLONOPIN) 0.5 MG tablet Take 0.5 mg by mouth 4 (four) times daily.        Marland Kitchen dipyridamole-aspirin (AGGRENOX) 25-200 MG per 12 hr capsule Take 1 capsule by mouth 2 (two) times daily.        Marland Kitchen doxazosin (CARDURA) 4 MG tablet Take 4 mg by mouth at bedtime.        . metFORMIN (GLUCOPHAGE) 500 MG tablet Take 500 mg by mouth 2 (two) times daily with a meal.        . metoprolol (LOPRESSOR) 50 MG tablet Take 50 mg by mouth 2 (two) times daily as needed. For blood pressure       No current facility-administered medications on file prior to visit.    Allergies   Allergen Reactions  . Lisinopril     Cough   . Macrodantin     Unknown  . Penicillins     Unknown  . Sulfa Drugs Cross Reactors     Unknown  . Zithromax (Azithromycin Dihydrate)     Unknown    Past Medical History  Diagnosis Date  . PVC (premature ventricular contraction)   . Hypertension   . Hyperlipidemia   . Anxiety   . Sinus bradycardia on ECG   . 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 Annalee Griffith.  . Mild aortic stenosis     per echo in 2010  . TIA (transient ischemic attack)   . Diabetes mellitus without complication     Past Surgical History  Procedure Laterality Date  . Squamous cell carcinoma excision      tongue    History  Smoking status  . Former Smoker  . Quit date: 06/25/1975  Smokeless tobacco  . Not on file    History  Alcohol Use No    Family History  Problem Relation Age of Onset  . Heart disease Mother   .  Heart attack Mother   . Heart disease Father   . Heart attack Father     Reviw of Systems:  Reviewed in the HPI.  All other systems are negative.  Physical Exam: Blood pressure 130/60, pulse 74, height 5\' 10"  (1.778 m), weight 193 lb 4 oz (87.658 kg). General: Well developed, well nourished, in no acute distress.  Head: Normocephalic, atraumatic, sclera non-icteric, mucus membranes are moist,   Neck: Supple. Soft right carotid bruit. JVD not elevated.  Lungs: Clear bilaterally   Heart: RRR with S1 S2. There is  2 /6 systolic murmur.  Abdomen: Soft, non-tender, non-distended with normoactive bowel sounds. No hepatomegaly. No rebound/guarding. No obvious abdominal masses.  Msk:  Strength and tone appear normal for age.  Extremities: No clubbing or cyanosis. No edema.  Distal pedal pulses are 2+ and equal bilaterally.  Neuro: Alert and oriented X 3. Moves all extremities spontaneously.  Psych:  Responds to questions appropriately with a normal affect.  ECG: May 17, 2012:  NSR at 68, RAE,  otherwise normal ECG  Assessment / Plan:

## 2012-05-17 NOTE — Assessment & Plan Note (Signed)
Stable

## 2012-05-17 NOTE — Assessment & Plan Note (Signed)
Will check lipids at next visit.   

## 2012-06-08 ENCOUNTER — Encounter: Payer: Self-pay | Admitting: Family Medicine

## 2012-06-08 ENCOUNTER — Other Ambulatory Visit (INDEPENDENT_AMBULATORY_CARE_PROVIDER_SITE_OTHER): Payer: Medicare Other | Admitting: *Deleted

## 2012-06-08 DIAGNOSIS — I6529 Occlusion and stenosis of unspecified carotid artery: Secondary | ICD-10-CM

## 2012-06-22 ENCOUNTER — Other Ambulatory Visit: Payer: Self-pay

## 2012-06-22 MED ORDER — ASPIRIN-DIPYRIDAMOLE ER 25-200 MG PO CP12: 1.0000 | ORAL_CAPSULE | Freq: Two times a day (BID) | ORAL | Status: DC

## 2012-06-22 NOTE — Telephone Encounter (Signed)
Former [facility] patient.  Has not been assigned new MD.  Auth refills via [facility]  

## 2012-06-24 ENCOUNTER — Other Ambulatory Visit: Payer: Self-pay | Admitting: *Deleted

## 2012-06-24 MED ORDER — METOPROLOL TARTRATE 50 MG PO TABS: 50.0000 mg | ORAL_TABLET | Freq: Two times a day (BID) | ORAL | Status: DC | PRN

## 2012-06-24 NOTE — Telephone Encounter (Signed)
Fax Received. Refill Completed. Dashia Caldeira Chowoe (R.M.A)   

## 2012-06-30 ENCOUNTER — Encounter: Payer: Self-pay | Admitting: Cardiovascular Disease

## 2012-06-30 ENCOUNTER — Encounter: Payer: Self-pay | Admitting: Internal Medicine

## 2012-07-27 ENCOUNTER — Ambulatory Visit (INDEPENDENT_AMBULATORY_CARE_PROVIDER_SITE_OTHER): Payer: Medicare Other | Admitting: Neurology

## 2012-07-27 ENCOUNTER — Encounter: Payer: Self-pay | Admitting: Neurology

## 2012-07-27 ENCOUNTER — Other Ambulatory Visit: Payer: Self-pay | Admitting: *Deleted

## 2012-07-27 VITALS — BP 122/65 | HR 70 | Temp 97.7°F | Ht 70.25 in | Wt 177.0 lb

## 2012-07-27 DIAGNOSIS — G459 Transient cerebral ischemic attack, unspecified: Secondary | ICD-10-CM | POA: Insufficient documentation

## 2012-07-27 DIAGNOSIS — M316 Other giant cell arteritis: Secondary | ICD-10-CM

## 2012-07-27 NOTE — Progress Notes (Signed)
Stroke/TIA Follow up   Alexis Griffith is an 72 y.o. male.   HPI:  Alexis Griffith is a 72 year old Caucasian gentleman who comes in today for followup of TIA.  He was a former patient of Dr. Sandria Manly. History from patient old chart. History of TIA dates back to 08/20/2002 in which he had a sudden onset of gait disorder. 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 There were findings the proximal stenosis in the left fatigue or coming off the subclavian and stenosis of the right ICA. He has history of anxiety and been on benzodiazepines for over 40 years. 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 Annalee Genta.   in late 2013, he developed jaw claudication and visual field loss. He underwent a left temporal artery biopsy by Dr. Annalee Genta 12/31/2011 and was started on Prednisone 60mg  Wed after surgery 12/31/2011 he noted staggering gait and went to the [facility]. Imaging was again negative for stroke. 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  He vision improved on Prednisone and sees opthamologist (Dr. Dagoberto Ligas) every 3 months. He saw Dr. Kellie Simmering who adjusted his Prednisone to 40mg  daily.  On Prednisone his blood sugar became uncontrolled and his PCP Dr. Wylene Simmer started him on insulin.   UPDATE: 07/27/12 Alexis Griffith he is doing better, Dr. Wylene Simmer recently reduced his prednisone to 15 mg from 20 mg daily.  His vision is back to normal and he has a 3 month appt. With opthamologist tomorrow.  Carotid dopplers recently done at [facility], unchanged.  No neurological deficits.  On Aggrenox for secondary stroke prevention. No side effects reported, no signs of bleeding. Patient reports blood pressure is well controlled, and is  122/65 in office today. Most recent hemoglobin A1c is 5.6. Lipids are well controlled.   Past Medical History  Diagnosis Date  . PVC (premature ventricular contraction)   . Hypertension   . Hyperlipidemia   . Anxiety   . Sinus bradycardia on ECG   . 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 Annalee Genta.  . Mild aortic stenosis     per echo in 2010  . TIA (transient ischemic attack)   . Diabetes mellitus without complication   . Sleep apnea     Past Surgical History  Procedure Laterality Date  . Squamous cell carcinoma excision      tongue    Family History  Problem Relation Age of Onset  . Heart disease Mother   . Heart attack Mother   . Heart disease Father   . Heart attack Father     Social History:  reports that he quit smoking about 37 years ago. He does not have any smokeless tobacco history on file. He reports that he does not drink alcohol or use illicit drugs.  Allergies:  Allergies  Allergen Reactions  . Lisinopril     Cough   . Macrodantin     Unknown  . Penicillins     Unknown  . Sulfa Drugs Cross Reactors     Unknown  . Zithromax (Azithromycin Dihydrate)     Unknown    Blood pressure 122/65, pulse 70, temperature 97.7 F (36.5 C), temperature source Oral, height 5'  10.25" (1.784 m), weight 177 lb (80.287 kg). Physical Exam ;  Awake alert. Afebrile. Head is nontraumatic. Neck is supple without bruit. Hearing is normal. Cardiac exam no murmur or gallop. Lungs are clear to auscultation. Distal pulses are well felt. No temporal artery tenderness bilaterally. Neurologic Examination:  Mental Status:  Alert, oriented, thought content appropriate. Speech fluent without evidence of aphasia. Able to follow 3 step commands without difficulty.  Cranial Nerves:  II: Visual fields grossly normal, pupils equal, round, reactive to light and accommodation  III,IV, VI: ptosis not present, extra-ocular motions intact bilaterally  V,VII:  smile symmetric, facial light touch sensation normal bilaterally  VIII: hearing normal bilaterally  IX,X: gag reflex present  XI: bilateral shoulder shrug  XII: midline tongue extension  Motor:  Right : Upper extremity 5/5 Left: Upper extremity 5/5  Lower extremity 5/5 Lower extremity 5/5  Tone and bulk:normal tone throughout; no atrophy noted  Sensory: Pinprick and light touch intact throughout, bilaterally  Deep Tendon Reflexes: 2+ and symmetric throughout  Cerebellar:  normal finger-to-nose Gait: normal   CTA NECK 12/31/11 Progression of atherosclerotic disease and stenosis in the proximal internal carotid artery bilaterally. There is now a 65% stenosis in the proximal right internal carotid artery and a 45% diameter stenosis in the proximal left internal carotid artery. 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 Left vertebral artery is widely patent. 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  Atherosclerotic disease in the cavernous carotid bilaterally. No other significant intracranial stenosis. Carotid Doppler 06/09/12 Evidence of <40% bilateral internal carotid artery stenosis.  Bilateral vertebral artery is antegrade. Right external carotid artery stenosis. No significant change in comparison to the last exam, 02/10/11.  Assessment/Plan: Alexis Griffith is a 72 year old Caucasian gentleman who is being seen for follow up of TIAs, most recently  12/31/11, and giant cell arteritis.  He has no current neurological symptoms.  He sees his ophthalmologist Dr. Dagoberto Ligas every 3 months for vision problems associated with giant cell arteritis. His PCP, Dr. Wylene Simmer, is tapering his prednisone. Urged patient not to drop prednisone dose to less than 10 mg a day and call M.D. if any vision problems or jaw pain returns.    Continue  Aggrenox for secondary stroke  prevention.  Strict risk factor control with BP goal less than 130/80, Hgb A1c <6.5, and LDL <70.  Follow up in 6 months.  Check ESR 6 monthly.   Ronal Fear 07/27/2012, 3:09 PM   I have personally examined this patient, reviewed pertinent data, developed plan of care and discussed with patient and agree with above.  Delia Heady, MD

## 2012-07-27 NOTE — Patient Instructions (Signed)
Continue current Medications.  Advise not to taper Prednisone to less than 10 mg daily.  If eye symptoms or jaw symptoms worsen, call MD.  Return for follow up in 6 months.  STROKE/TIA INSTRUCTIONS SMOKING Cigarette smoking nearly doubles your risk of having a stroke & is the single most alterable risk factor  If you smoke or have smoked in the last 12 months, you are advised to quit smoking for your health.  Most of the excess cardiovascular risk related to smoking disappears within a year of stopping.  Ask you doctor about anti-smoking medications  [place] Quit Line: 1-800-QUIT NOW  Free Smoking Cessation Classes ([phone]  CHOLESTEROL Know your levels; limit fat & cholesterol in your diet  Lab Results  Component Value Date   CHOL 152 12/30/2011   HDL 35* 12/30/2011   LDLCALC 98 12/30/2011   TRIG 95 12/30/2011   CHOLHDL 4.3 12/30/2011      Many patients benefit from treatment even if their cholesterol is at goal.  Goal: Total Cholesterol less than 160  Goal:  LDL less than 100  Goal:  HDL greater than 40  Goal:  Triglycerides less than 150  BLOOD PRESSURE American Stroke Association blood pressure target is less that 120/80 mm/Hg  Your discharge blood pressure is:  BP: 122/65 mmHg  Monitor your blood pressure  Limit your salt and alcohol intake  Many individuals will require more than one medication for high blood pressure  DIABETES (A1c is a blood sugar average for last 3 months) Goal A1c is under 7% (A1c is blood sugar average for last 3 months)  Diabetes: Diagnosis of diabetes:  Your A1c:5.6 %    Lab Results  Component Value Date   HGBA1C 5.6 12/30/2011    Your A1c can be lowered with medications, healthy diet, and exercise.  Check your blood sugar as directed by your physician  Call your physician if you experience unexplained or low blood sugars.  PHYSICAL ACTIVITY/REHABILITATION Goal is 30 minutes at least 4 days per week    Activity decreases your  risk of heart attack and stroke and makes your heart stronger.  It helps control your weight and blood pressure; helps you relax and can improve your mood.  Participate in a regular exercise program.  Talk with your doctor about the best form of exercise for you (dancing, walking, swimming, cycling).  DIET/WEIGHT Goal is to maintain a healthy weight  Your height is:  Height: 5' 10.25" (178.4 cm) Your current weight is: Weight: 177 lb (80.287 kg) Your body Mass Index (BMI) is:  BMI (Calculated): 25.3  Following the type of diet specifically designed for you will help prevent another stroke.  Your goal Body Mass Index (BMI) is 19-24.  Healthy food habits can help reduce 3 risk factors for stroke:  High cholesterol, hypertension, and excess weight.

## 2012-10-05 ENCOUNTER — Other Ambulatory Visit: Payer: Self-pay

## 2012-11-03 ENCOUNTER — Other Ambulatory Visit: Payer: Self-pay

## 2012-11-03 MED ORDER — OLMESARTAN MEDOXOMIL-HCTZ 40-12.5 MG PO TABS: 1.0000 | ORAL_TABLET | Freq: Every day | ORAL | Status: DC

## 2012-11-30 ENCOUNTER — Ambulatory Visit: Payer: Medicare Other | Admitting: Cardiovascular Disease

## 2012-12-20 ENCOUNTER — Encounter: Payer: Self-pay | Admitting: Cardiovascular Disease

## 2012-12-20 ENCOUNTER — Ambulatory Visit (INDEPENDENT_AMBULATORY_CARE_PROVIDER_SITE_OTHER): Payer: Medicare Other | Admitting: Cardiovascular Disease

## 2012-12-20 VITALS — BP 146/70 | HR 65 | Ht 70.0 in | Wt 200.5 lb

## 2012-12-20 DIAGNOSIS — I779 Disorder of arteries and arterioles, unspecified: Secondary | ICD-10-CM

## 2012-12-20 DIAGNOSIS — I493 Ventricular premature depolarization: Secondary | ICD-10-CM

## 2012-12-20 DIAGNOSIS — I10 Essential (primary) hypertension: Secondary | ICD-10-CM

## 2012-12-20 DIAGNOSIS — I4949 Other premature depolarization: Secondary | ICD-10-CM

## 2012-12-20 DIAGNOSIS — E785 Hyperlipidemia, unspecified: Secondary | ICD-10-CM

## 2012-12-20 NOTE — Patient Instructions (Signed)
Your physician recommends that you continue on your current medications as directed. Please refer to the Current Medication list given to you today.  Your physician recommends that you return for lab work in: 6 months  Your physician wants you to follow-up in: 6 months You will receive a reminder letter in the mail two months in advance. If you don't receive a letter, please call our office to schedule the follow-up appointment.  

## 2012-12-20 NOTE — Assessment & Plan Note (Signed)
Pt is doing well.  No arrhythmias.  Continue current meds

## 2012-12-20 NOTE — Assessment & Plan Note (Signed)
Will check fasting lipids, HFP and BMP at his next visit in 6 months.

## 2012-12-20 NOTE — Assessment & Plan Note (Signed)
His BP is fairly well controlled.

## 2012-12-20 NOTE — Progress Notes (Signed)
Alexis Griffith Date of Birth  Sep 06, 1940 [facility]     [place] [facility]  [address]    [address]   [address] [place], [place]  [address]    [place], [place]  [address] [phone]  Fax  [phone]  [phone]  Fax [phone]   Problem list: 1. Premature ventricular contractions 2. Anxiety 3. Hypertension 4. Hyperlipidemia 5. 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 Bilateral carotid artery disease - moderate 7. 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 TIA   History of Present Illness:  Alexis Griffith ihas done well.  He has continued to lose weight.  He has been watching his diet.  He is not exercising very much.  He has stopped his Crestor because of muscle aches.  His muscle pains resolved 2 days after he stopped the Crestor.  He had been on the Crestor for 15 years but stopped it several weeks ago because of his muscle  Aches.  May 17, 2012:  Alexis Griffith is doing well.  No cardiac problems.  He is upset about Dr. Katrine Coho retirement this past weekend.   He will start getting his carotid Duplex with [place].    Oct. 21, 2014:  He is doing well from a cardiac standpoint.    He had a carotid duplex scan which show mild irregularlites.    He has done well from a cardiac standpoint.  He has gained some weight recently.   He has noticed that he is having to take more BP meds (he takes extra BP meds as he feels he needs).   He is not getting any regular exercise.    Current Outpatient Prescriptions on File Prior to Visit  Medication Sig Dispense Refill  . alendronate (FOSAMAX) 70 MG tablet Take 70 mg by mouth every 7 (seven) days. Take with a full glass of water on an empty stomach.      Marland Kitchen buPROPion (WELLBUTRIN SR) 150 MG 12 hr tablet Take 150 mg by mouth daily.        . Choline Fenofibrate (TRILIPIX) 135 MG capsule Take 135 mg by mouth daily.        . clonazePAM (KLONOPIN) 0.5 MG tablet Take 0.5 mg by mouth 4 (four) times daily.        .  CRESTOR 10 MG tablet Take 10 mg by mouth daily.       Marland Kitchen dipyridamole-aspirin (AGGRENOX) 200-25 MG per 12 hr capsule Take 1 capsule by mouth 2 (two) times daily.  180 capsule  2  . doxazosin (CARDURA) 4 MG tablet Take 4 mg by mouth at bedtime.        . insulin glargine (LANTUS) 100 units/mL SOLN Inject 10 Units into the skin daily.      . Linagliptin (TRADJENTA PO) Take by mouth daily.      . metFORMIN (GLUCOPHAGE) 500 MG tablet Take 500 mg by mouth 2 (two) times daily with a meal.        . metoprolol (LOPRESSOR) 50 MG tablet Take 1 tablet (50 mg total) by mouth 2 (two) times daily as needed. For blood pressure  60 tablet  5  . olmesartan-hydrochlorothiazide (BENICAR HCT) 40-12.5 MG per tablet Take 1 tablet by mouth daily. 1 PO PRN  30 tablet  3  . ONE TOUCH ULTRA TEST test strip        No current facility-administered medications on file prior to visit.    Allergies  Allergen Reactions  . Lisinopril     Cough   . Macrodantin     Unknown  . Penicillins     Unknown  . Sulfa Drugs Cross Reactors     Unknown  . Zithromax [Azithromycin Dihydrate]     Unknown    Past Medical History  Diagnosis Date  . PVC (premature ventricular contraction)   . Hypertension   . Hyperlipidemia   . Anxiety   . Sinus bradycardia on ECG   . 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 Annalee Genta.  . Mild aortic stenosis     per echo in 2010  . TIA (transient ischemic attack)   . Diabetes mellitus without complication   . Sleep apnea     Past Surgical History  Procedure Laterality Date  . Squamous cell carcinoma excision      tongue    History  Smoking status  . Former Smoker  . Quit date: 06/25/1975  Smokeless tobacco  . Not on file    History  Alcohol Use No    Family History  Problem Relation Age of Onset  . Heart disease Mother   . Heart attack Mother   . Heart disease Father   . Heart attack Father     Reviw of Systems:  Reviewed in the HPI.  All  other systems are negative.  Physical Exam: Blood pressure 146/70, pulse 65, height 5\' 10"  (1.778 m), weight 200 lb 8 oz (90.946 kg). General: Well developed, well nourished, in no acute distress.  Head: Normocephalic, atraumatic, sclera non-icteric, mucus membranes are moist,   Neck: Supple. Soft right carotid bruit. JVD not elevated.  Lungs: Clear bilaterally   Heart: RRR with S1 S2. There is  2 /6 systolic murmur.  Abdomen: Soft, non-tender, non-distended with normoactive bowel sounds. No hepatomegaly. No rebound/guarding. No obvious abdominal masses.  Msk:  Strength and tone appear normal for age.  Extremities: No clubbing or cyanosis. No edema.  Distal pedal pulses are 2+ and equal bilaterally.  Neuro: Alert and oriented X 3. Moves all extremities spontaneously.  Psych:  Responds to questions appropriately with a normal affect.  ECG: Oct. 21, 2014:  NSR at 65.  NS ST abn.   Assessment / Plan:

## 2013-01-05 ENCOUNTER — Other Ambulatory Visit: Payer: Self-pay

## 2013-01-24 ENCOUNTER — Other Ambulatory Visit: Payer: Self-pay | Admitting: Cardiovascular Disease

## 2013-01-24 ENCOUNTER — Ambulatory Visit: Payer: Medicare Other | Admitting: Neurology

## 2013-03-14 ENCOUNTER — Other Ambulatory Visit: Payer: Self-pay | Admitting: Neurology

## 2013-03-27 ENCOUNTER — Telehealth: Payer: Self-pay | Admitting: Neurology

## 2013-03-27 NOTE — Telephone Encounter (Signed)
Patient called to state that he was instructed to call and schedule an appointment before he can get any more medication refills. Attempted to schedule with Dr. [name] but first available was not soon enough for patient. Please call to schedule.

## 2013-03-27 NOTE — Telephone Encounter (Signed)
Patient has scheduled appt, confirmed

## 2013-04-05 ENCOUNTER — Ambulatory Visit (INDEPENDENT_AMBULATORY_CARE_PROVIDER_SITE_OTHER): Payer: Medicare Other | Admitting: Cardiovascular Disease

## 2013-04-05 ENCOUNTER — Encounter: Payer: Self-pay | Admitting: Cardiovascular Disease

## 2013-04-05 ENCOUNTER — Ambulatory Visit: Payer: Medicare Other | Admitting: Cardiovascular Disease

## 2013-04-05 ENCOUNTER — Other Ambulatory Visit: Payer: Medicare Other

## 2013-04-05 VITALS — BP 114/60 | HR 58 | Ht 70.0 in | Wt 200.2 lb

## 2013-04-05 DIAGNOSIS — E785 Hyperlipidemia, unspecified: Secondary | ICD-10-CM

## 2013-04-05 NOTE — Progress Notes (Signed)
Alexis Griffith Date of Birth  Jan 23, 1941 [facility] [address]    [place]   [place], [place]  [address]    [place], [place]  [address] [phone]  Fax  [phone]  [phone]  Fax [phone]   Problem list: 1. Premature ventricular contractions 2. Anxiety 3. Hypertension 4. Hyperlipidemia 5. 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 Bilateral carotid artery disease - moderate 7. 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 TIA   History of Present Illness:  Alexis Griffith done well.  He has continued to lose weight.  He has been watching his diet.  He is not exercising very much.  He has stopped his Crestor because of muscle aches.  His muscle pains resolved 2 days after he stopped the Crestor.  He had been on the Crestor for 15 years but stopped it several weeks ago because of his muscle  Aches.  May 17, 2012:  Alexis Griffith is doing well.  No cardiac problems.  He is upset about Dr. Heywood Footman retirement this past weekend.   He will start getting his carotid Duplex with [place].    Oct. 21, 2014:  He is doing well from a cardiac standpoint.    He had a carotid duplex scan which show mild irregularlites.    He has done well from a cardiac standpoint.  He has gained some weight recently.   He has noticed that he is having to take more BP meds (he takes extra BP meds as he feels he needs).   He is not getting any regular exercise.    Feb. 4, 2015: Alexis Griffith is doing ok.  He is tapering his prednisone ( for temporal arteritis).  His last ESR is 2.    He has been having muscle pain and he stopped the Crestor.  He has not been exercising.  BP has been ok.   Current Outpatient Prescriptions on File Prior to Visit  Medication Sig Dispense Refill  . AGGRENOX 25-200 MG per 12 hr capsule TAKE (1) CAPSULE TWICE DAILY.  60 capsule  1  . alendronate (FOSAMAX) 70 MG tablet Take 70 mg by mouth every 7 (seven) days. Take  with a full glass of water on an empty stomach.      Marland Kitchen buPROPion (WELLBUTRIN SR) 150 MG 12 hr tablet Take 150 mg by mouth daily.        . Choline Fenofibrate (TRILIPIX) 135 MG capsule Take 135 mg by mouth daily.        . clonazePAM (KLONOPIN) 0.5 MG tablet Take 0.5 mg by mouth 4 (four) times daily.        Marland Kitchen doxazosin (CARDURA) 4 MG tablet Take 4 mg by mouth at bedtime.        . insulin glargine (LANTUS) 100 units/mL SOLN Inject 10 Units into the skin daily.      . Linagliptin (TRADJENTA PO) Take by mouth daily.      . metFORMIN (GLUCOPHAGE) 500 MG tablet Take 500 mg by mouth 2 (two) times daily with a meal.        . metoprolol (LOPRESSOR) 50 MG tablet TAKE 1 TABLET TWICE DAILY AS NEEDED FOR BLOOD PRESSURE.  60 tablet  3  . olmesartan-hydrochlorothiazide (BENICAR HCT) 40-12.5 MG per tablet Take 1 tablet by mouth daily. 1 PO PRN  30 tablet  3  . ONE TOUCH ULTRA TEST test strip  No current facility-administered medications on file prior to visit.    Allergies  Allergen Reactions  . Lisinopril     Cough   . Macrodantin     Unknown  . Penicillins     Unknown  . Sulfa Drugs Cross Reactors     Unknown  . Zithromax [Azithromycin Dihydrate]     Unknown    Past Medical History  Diagnosis Date  . PVC (premature ventricular contraction)   . Hypertension   . Hyperlipidemia   . Anxiety   . Sinus bradycardia on ECG   . 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 Wilburn Cornelia.  . Mild aortic stenosis     per echo in 2010  . TIA (transient ischemic attack)   . Diabetes mellitus without complication   . Sleep apnea     Past Surgical History  Procedure Laterality Date  . Squamous cell carcinoma excision      tongue    History  Smoking status  . Former Smoker  . Quit date: 06/25/1975  Smokeless tobacco  . Not on file    History  Alcohol Use No    Family History  Problem Relation Age of Onset  . Heart disease Mother   . Heart attack Mother   .  Heart disease Father   . Heart attack Father     Reviw of Systems:  Reviewed in the HPI.  All other systems are negative.  Physical Exam: Blood pressure 114/60, pulse 58, height $RemoveBe'5\' 10"'gPHwwUpBk$  (1.778 m), weight 200 lb 4 oz (90.833 kg). General: Well developed, well nourished, in no acute distress.  Head: Normocephalic, atraumatic, sclera non-icteric, mucus membranes are moist,   Neck: Supple. Soft right carotid bruit. JVD not elevated.  Lungs: Clear bilaterally   Heart: RRR with S1 S2. There is  2 /6 systolic murmur.  Abdomen: Soft, non-tender, non-distended with normoactive bowel sounds. No hepatomegaly. No rebound/guarding. No obvious abdominal masses.  Msk:  Strength and tone appear normal for age.  Extremities: No clubbing or cyanosis. No edema.  Distal pedal pulses are 2+ and equal bilaterally.  Neuro: Alert and oriented X 3. Moves all extremities spontaneously.  Psych:  Responds to questions appropriately with a normal affect.  ECG:  Assessment / Plan:

## 2013-04-05 NOTE — Assessment & Plan Note (Signed)
Alexis Griffith was not able to tolerate the Crestor. He had lots of muscle aches. He stopped the Crestor several days ago and already feels better / fewer muscle aches.   Will check lipids today. I'll see him again in 6 months we'll check fasting lipids again at that time.

## 2013-04-05 NOTE — Patient Instructions (Signed)
Your physician wants you to follow-up in: 6 months. You will receive a reminder letter in the mail two months in advance. If you don't receive a letter, please call our office to schedule the follow-up appointment.  Your physician recommends that you have lab work today: Lipid  Liver  BMP  Your physician recommends that you return for lab work in:  6 months   Lipid  Liver  BMP

## 2013-04-06 LAB — BASIC METABOLIC PANEL
BUN/Creatinine Ratio: 18 (ref 10–22)
BUN: 18 mg/dL (ref 8–27)
CO2: 24 mmol/L (ref 18–29)
Calcium: 9.5 mg/dL (ref 8.6–10.2)
Chloride: 103 mmol/L (ref 97–108)
Creatinine, Ser: 0.99 mg/dL (ref 0.76–1.27)
GFR calc Af Amer: 88 mL/min/{1.73_m2} (ref >59)
GFR calc non Af Amer: 76 mL/min/{1.73_m2} (ref >59)
Glucose: 99 mg/dL (ref 65–99)
Potassium: 4.5 mmol/L (ref 3.5–5.2)
Sodium: 143 mmol/L (ref 134–144)

## 2013-04-06 LAB — LIPID PANEL
Chol/HDL Ratio: 3 ratio units (ref 0.0–5.0)
Cholesterol, Total: 148 mg/dL (ref 100–199)
HDL: 50 mg/dL (ref >39)
LDL Calculated: 74 mg/dL (ref 0–99)
Triglycerides: 120 mg/dL (ref 0–149)
VLDL Cholesterol Cal: 24 mg/dL (ref 5–40)

## 2013-04-06 LAB — HEPATIC FUNCTION PANEL
ALT: 18 IU/L (ref 0–44)
AST: 20 IU/L (ref 0–40)
Albumin: 4.5 g/dL (ref 3.5–4.8)
Alkaline Phosphatase: 28 IU/L — ABNORMAL LOW (ref 39–117)
Bilirubin, Direct: 0.11 mg/dL (ref 0.00–0.40)
Total Bilirubin: 0.3 mg/dL (ref 0.0–1.2)
Total Protein: 6.5 g/dL (ref 6.0–8.5)

## 2013-05-01 ENCOUNTER — Telehealth: Payer: Self-pay | Admitting: *Deleted

## 2013-05-01 MED ORDER — LOSARTAN POTASSIUM-HCTZ 100-12.5 MG PO TABS: 1.0000 | ORAL_TABLET | Freq: Every day | ORAL | Status: DC

## 2013-05-01 NOTE — Telephone Encounter (Signed)
Dr Acie Fredrickson, [facility] sent fax requesting change from Benicar HCT 40-12.5 mg to generic Hyzaar for "better co-pay"? Thanks, Maudie Mercury

## 2013-05-01 NOTE — Telephone Encounter (Signed)
Rx was switched per Dr Acie Fredrickson.

## 2013-05-01 NOTE — Telephone Encounter (Signed)
Ok to change to Losartan 100/ HCT 12.5 mg a day.

## 2013-05-15 ENCOUNTER — Other Ambulatory Visit: Payer: Self-pay | Admitting: Neurology

## 2013-05-30 ENCOUNTER — Other Ambulatory Visit: Payer: Self-pay | Admitting: Cardiovascular Disease

## 2013-06-05 DIAGNOSIS — R351 Nocturia: Secondary | ICD-10-CM

## 2013-06-05 DIAGNOSIS — N320 Bladder-neck obstruction: Secondary | ICD-10-CM

## 2013-06-05 DIAGNOSIS — N400 Enlarged prostate without lower urinary tract symptoms: Secondary | ICD-10-CM | POA: Insufficient documentation

## 2013-06-05 HISTORY — DX: Nocturia: R35.1

## 2013-06-05 HISTORY — DX: Bladder-neck obstruction: N32.0

## 2013-06-06 ENCOUNTER — Telehealth: Payer: Self-pay

## 2013-06-06 DIAGNOSIS — E785 Hyperlipidemia, unspecified: Secondary | ICD-10-CM

## 2013-06-06 NOTE — Telephone Encounter (Signed)
Pt left msg on vm, would like a nurse to call him, he has a question regarding his medicaiton.

## 2013-06-06 NOTE — Telephone Encounter (Signed)
Attempted to return call no answer 4/7

## 2013-06-07 MED ORDER — PRAVASTATIN SODIUM 40 MG PO TABS: 40.0000 mg | ORAL_TABLET | Freq: Every evening | ORAL | Status: DC

## 2013-06-07 NOTE — Telephone Encounter (Signed)
He has stopped the crestor many times in the past due to muscle aches.  Lets try pravachol 40 mg a day and check lipids, liver, bmp in 3 months.  If he does not tolerate this, we can refer him to lipid clinic.

## 2013-06-07 NOTE — Telephone Encounter (Signed)
Patient has been on Crestor for more than 10 years and came off of it recently  He "feels like it has caused him muscle problems since he has been on it" so he was glad to stop it He saw his PCP this week and his PCP told him to continue the Crestor  Patient stated he wanted a second opinion from Dr. Acie Fredrickson

## 2013-06-07 NOTE — Telephone Encounter (Signed)
Informed patient that per Dr Acie Fredrickson "He has stopped the crestor many times in the past due to muscle aches. Lets try pravachol 40 mg a day and check lipids, liver, bmp in 3 months. If he does not tolerate this, we can refer him to lipid clinic."  Lab scheduled Pravachol 40 mg daily ordered  Patient verbalized understanding

## 2013-06-07 NOTE — Telephone Encounter (Signed)
LVM 4/8

## 2013-06-20 ENCOUNTER — Ambulatory Visit (INDEPENDENT_AMBULATORY_CARE_PROVIDER_SITE_OTHER): Payer: Medicare Other | Admitting: Neurology

## 2013-06-20 ENCOUNTER — Encounter: Payer: Self-pay | Admitting: Neurology

## 2013-06-20 VITALS — BP 144/80 | HR 72 | Ht 70.0 in | Wt 203.0 lb

## 2013-06-20 DIAGNOSIS — I6529 Occlusion and stenosis of unspecified carotid artery: Secondary | ICD-10-CM

## 2013-06-20 NOTE — Patient Instructions (Addendum)
Continue Aggrenox for second stroke prevention with strict control Hoff blood pressure with goal below 130/90, diabetes with HbA1c goal below 7and lipids his LDL cholesterol goal below 70 mg percent. Continue to check ESR 6 monthly. Check followup for carotid ultrasound. Return for followup in 6 months with Charlott Holler, NP. call earlier if necessary

## 2013-06-20 NOTE — Progress Notes (Signed)
Stroke/TIA Follow up   Alexis Griffith is an 73 y.o. male.   HPI:  [name] is a 73 year old Caucasian gentleman who comes in today for followup of TIA.  He was a former patient of Dr. Erling Cruz. History from patient old chart. History of TIA dates back to 08/20/2002 in which he had a sudden onset of gait disorder. 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 There were findings the proximal stenosis in the left fatigue or coming off the subclavian and stenosis of the right ICA. He has history of anxiety and been on benzodiazepines for over 40 years. 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 Wilburn Cornelia.   in late 2013, he developed jaw claudication and visual field loss. He underwent a left temporal artery biopsy by Dr. Wilburn Cornelia 12/31/2011 and was started on Prednisone $RemoveBefor'60mg'XngyPoziMNZx$  Wed after surgery 12/31/2011 he noted staggering gait and went to the [facility] ER. Imaging was again negative for stroke. 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  He vision improved on Prednisone and sees opthamologist (Dr. Kathrin Penner) every 3 months. He saw Dr. Charlestine Night who adjusted his Prednisone to $RemoveBefor'40mg'RhqiuPTCZtJc$  daily.  On Prednisone his blood sugar became uncontrolled and his PCP Dr. Osborne Casco started him on insulin.   UPDATE: 07/27/12 Mr. Hulse states he is doing better, Dr. Osborne Casco recently reduced his prednisone to 15 mg from 20 mg daily.  His vision is back to normal and he has a 3 month appt. With opthamologist tomorrow.  Carotid dopplers recently done at [facility], unchanged.  No neurological deficits.  On Aggrenox for secondary stroke prevention. No side effects reported, no signs of bleeding. Patient reports blood pressure is well controlled, and is  122/65 in office today. Most recent hemoglobin A1c is 5.6. Lipids are well controlled.  Update 06/20/2013 ; He returns for followup after last visit almost a year ago. He continues to do well without recurrence stroke or TIA symptoms now for several years. He has tapered his prednisone and has completely stopped it 2 weeks ago. His ESR is having quite stable for a year and a half. He has no active symptoms of temporal arteritis in the form of jaw claudication, scalp tenderness, headaches or vision loss. He is complaining of muscle aches and arthralgias he was previously on Lipitor in which has been switched to Pravachol 40 mg 2 weeks ago by his primary care physician. Past Medical History  Diagnosis Date  . PVC (premature ventricular contraction)   . Hypertension   . Hyperlipidemia   . Anxiety   . Sinus bradycardia on ECG   . 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 Wilburn Cornelia.  . Mild aortic stenosis     per echo in 2010  . TIA (transient ischemic attack)   . Diabetes mellitus without complication   . Sleep apnea     Past Surgical History  Procedure Laterality Date  . Squamous cell carcinoma excision      tongue    Family History  Problem Relation Age of Onset  . Heart disease Mother   . Heart attack Mother   . Heart disease Father   . Heart attack Father     Social History:  reports that he quit smoking about 38 years ago. He does not have any smokeless tobacco history on file. He reports that he does not drink alcohol or use illicit drugs.  Allergies:  Allergies  Allergen Reactions  . Lisinopril     Cough   . Macrodantin     Unknown  . Penicillins     Unknown  . Sulfa Drugs Cross Reactors     Unknown  . Zithromax [Azithromycin Dihydrate]     Unknown    Blood pressure 144/80, pulse 72, height $RemoveBe'5\' 10"'ZPiVdyPwN$  (1.778 m), weight 203 lb (92.08 kg). Physical Exam ;  Awake alert. Afebrile. Head is nontraumatic. Neck is supple without bruit. Hearing is  normal. Cardiac exam no murmur or gallop. Lungs are clear to auscultation. Distal pulses are well felt. No temporal artery tenderness bilaterally. Neurologic Examination:  Mental Status:  Alert, oriented, thought content appropriate. Speech fluent without evidence of aphasia. Able to follow 3 step commands without difficulty.  Cranial Nerves:  II: Visual fields grossly normal, pupils equal, round, reactive to light and accommodation  III,IV, VI: ptosis not present, extra-ocular motions intact bilaterally  V,VII: smile symmetric, facial light touch sensation normal bilaterally  VIII: hearing normal bilaterally  IX,X: gag reflex present  XI: bilateral shoulder shrug  XII: midline tongue extension  Motor:  Right : Upper extremity 5/5 Left: Upper extremity 5/5  Lower extremity 5/5 Lower extremity 5/5  Tone and bulk:normal tone throughout; no atrophy noted  Sensory: Pinprick and light touch intact throughout, bilaterally  Deep Tendon Reflexes: 2+ and symmetric throughout  Cerebellar:  normal finger-to-nose Gait: normal   CTA NECK 12/31/11 Progression of atherosclerotic disease and stenosis in the proximal internal carotid artery bilaterally. There is now a 65% stenosis in the proximal right internal carotid artery and a 45% diameter stenosis in the proximal left internal carotid artery. 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 Left vertebral artery is widely patent. 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  Atherosclerotic disease in the cavernous carotid bilaterally. No other significant intracranial stenosis. Carotid Doppler 06/09/12 Evidence of <40% bilateral internal carotid artery stenosis.  Bilateral vertebral artery is antegrade. Right external carotid artery stenosis. No significant change in comparison to the last exam, 02/10/11.  Assessment/Plan:  [name] is a 73 year old Caucasian gentleman who is being seen for follow up of TIAs, most recently  12/31/11, and giant cell arteritis.  He has no current neurological symptoms.   Continue  Aggrenox for secondary stroke prevention.  Strict risk factor control with BP goal less than 130/80, Hgb A1c <6.5, and LDL <70.  Follow up in 6 months.  Check ESR 6 monthly. Call primary M.D. in case of headaches, vision loss to restart prednisone  Check followup carotid ultrasound  F/U with Charlott Holler, NP in 6 months   Garvin Fila 06/20/2013, 5:48 PM   I

## 2013-07-05 ENCOUNTER — Ambulatory Visit (INDEPENDENT_AMBULATORY_CARE_PROVIDER_SITE_OTHER): Payer: Medicare Other

## 2013-07-05 DIAGNOSIS — I6529 Occlusion and stenosis of unspecified carotid artery: Secondary | ICD-10-CM

## 2013-07-14 ENCOUNTER — Other Ambulatory Visit: Payer: Self-pay | Admitting: Neurology

## 2013-07-19 ENCOUNTER — Telehealth: Payer: Self-pay | Admitting: Neurology

## 2013-07-19 NOTE — Telephone Encounter (Signed)
Patient calling requesting Carotid doppler results.  Please return call.  Thanks

## 2013-07-28 NOTE — Telephone Encounter (Signed)
Called and gave the results of carotid doppler as no change from previous study. Repeat again in 12 months. Pt verbalized understanding.

## 2013-08-17 ENCOUNTER — Telehealth: Payer: Self-pay | Admitting: *Deleted

## 2013-08-17 NOTE — Telephone Encounter (Signed)
Patient having all over muscle aches and trouble walking. Worried it his meds, please call.

## 2013-08-17 NOTE — Telephone Encounter (Signed)
Patient is having muscle aches and trouble walking  He thinks that it is related to his pravastatin because he is had this issue in the past  He would like to try 20 mg of Pravastatin and see if that corrects the issue

## 2013-08-18 NOTE — Telephone Encounter (Signed)
Lvm 5/19

## 2013-08-18 NOTE — Telephone Encounter (Signed)
That sounds fine though he should stop it first until symptoms clear and then resume at the lower dose (20mg ).

## 2013-08-22 MED ORDER — PRAVASTATIN SODIUM 40 MG PO TABS: 20.0000 mg | ORAL_TABLET | Freq: Every evening | ORAL | Status: DC

## 2013-08-22 NOTE — Telephone Encounter (Signed)
Spoke with patient  He stated that he has been of his Pravastatin for 2 weeks  I advised him that per Ignacia Bayley NP to restart Pravastatin at 20 mg daily  Instructed him to call if he had anymore symptoms at the new dose  Patient verbalized understanding

## 2013-08-23 ENCOUNTER — Telehealth: Payer: Self-pay | Admitting: Cardiovascular Disease

## 2013-08-23 DIAGNOSIS — Z79899 Other long term (current) drug therapy: Secondary | ICD-10-CM

## 2013-08-23 DIAGNOSIS — E785 Hyperlipidemia, unspecified: Secondary | ICD-10-CM

## 2013-08-23 NOTE — Telephone Encounter (Signed)
New message      Pt is on a cholesterol medication 40mg .  He want to drop it back to 20mg  due to his muscle/joint pain.  Will this be ok?swin

## 2013-08-23 NOTE — Telephone Encounter (Signed)
We should check lipids, liver, bmp in 3 months. Ok to lower dose for now

## 2013-08-24 NOTE — Telephone Encounter (Signed)
Attempted to contact pt. No answer, no voicemail.

## 2013-08-25 MED ORDER — PRAVASTATIN SODIUM 20 MG PO TABS: 20.0000 mg | ORAL_TABLET | Freq: Every evening | ORAL | Status: DC

## 2013-08-25 NOTE — Telephone Encounter (Signed)
New Prob    Pt calling regarding one of his medications. Please call.

## 2013-08-25 NOTE — Telephone Encounter (Signed)
I spoke with pt & he will decrease his pravastatin to 20mg .  Prescription sent in   He will call back if the muscle pains get better & have fasting cholesterol labs in 3 months  Horton Chin RN

## 2013-08-29 ENCOUNTER — Telehealth: Payer: Self-pay | Admitting: *Deleted

## 2013-08-29 NOTE — Telephone Encounter (Signed)
Patient called to reschedule fasting labs as ordered by Dr. Acie Fredrickson  Lab scheduled for 11/27/13

## 2013-08-30 NOTE — Telephone Encounter (Signed)
This encounter was created in error - please disregard.

## 2013-09-06 ENCOUNTER — Other Ambulatory Visit: Payer: Medicare Other

## 2013-10-25 ENCOUNTER — Ambulatory Visit (INDEPENDENT_AMBULATORY_CARE_PROVIDER_SITE_OTHER): Payer: Medicare Other | Admitting: Cardiovascular Disease

## 2013-10-25 ENCOUNTER — Encounter: Payer: Self-pay | Admitting: Cardiovascular Disease

## 2013-10-25 VITALS — BP 122/60 | HR 58 | Ht 70.0 in | Wt 195.5 lb

## 2013-10-25 DIAGNOSIS — E785 Hyperlipidemia, unspecified: Secondary | ICD-10-CM

## 2013-10-25 DIAGNOSIS — I350 Nonrheumatic aortic (valve) stenosis: Secondary | ICD-10-CM

## 2013-10-25 DIAGNOSIS — I359 Nonrheumatic aortic valve disorder, unspecified: Secondary | ICD-10-CM

## 2013-10-25 DIAGNOSIS — I493 Ventricular premature depolarization: Secondary | ICD-10-CM

## 2013-10-25 DIAGNOSIS — I4949 Other premature depolarization: Secondary | ICD-10-CM

## 2013-10-25 DIAGNOSIS — I10 Essential (primary) hypertension: Secondary | ICD-10-CM

## 2013-10-25 NOTE — Progress Notes (Signed)
Alexis Griffith Date of Birth  August 11, 1940 [address]    [place]   [place], [place]  [address]    [place], [place]  [address] [phone]  Fax  [phone]  [phone]  Fax [phone]   Problem list: 1. Premature ventricular contractions 2. Anxiety 3. Hypertension 4. Hyperlipidemia 5. 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 Bilateral carotid artery disease - moderate 7. 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 TIA 9. Mild - moderate Aortic stenosis   History of Present Illness:  Alexis Griffith ihas done well.  He has continued to lose weight.  He has been watching his diet.  He is not exercising very much.  He has stopped his Crestor because of muscle aches.  His muscle pains resolved 2 days after he stopped the Crestor.  He had been on the Crestor for 15 years but stopped it several weeks ago because of his muscle  Aches.  May 17, 2012:  Alexis Griffith is doing well.  No cardiac problems.  He is upset about Dr. Heywood Footman retirement this past weekend.   He will start getting his carotid Duplex with [place].    Oct. 21, 2014:  He is doing well from a cardiac standpoint.    He had a carotid duplex scan which show mild irregularlites.    He has done well from a cardiac standpoint.  He has gained some weight recently.   He has noticed that he is having to take more BP meds (he takes extra BP meds as he feels he needs).   He is not getting any regular exercise.    Feb. 4, 2015: Alexis Griffith is doing ok.  He is tapering his prednisone ( for temporal arteritis).  His last ESR is 2.    He has been having muscle pain and he stopped the Crestor.  He has not been exercising.  BP has been ok.  October 25, 2013:  Alexis Griffith is doing well.   He had lots of muscle pains and has stopped the Pravachol    Current Outpatient Prescriptions on File Prior to Visit  Medication Sig Dispense Refill  . AGGRENOX 25-200 MG per 12 hr  capsule TAKE (1) CAPSULE TWICE DAILY.  60 capsule  6  . buPROPion (WELLBUTRIN SR) 150 MG 12 hr tablet Take 150 mg by mouth daily.        . Choline Fenofibrate (TRILIPIX) 135 MG capsule Take 135 mg by mouth daily.        . clonazePAM (KLONOPIN) 0.5 MG tablet Take 0.5 mg by mouth 4 (four) times daily.        Marland Kitchen doxazosin (CARDURA) 4 MG tablet Take 4 mg by mouth at bedtime.        Marland Kitchen losartan-hydrochlorothiazide (HYZAAR) 100-12.5 MG per tablet Take 1 tablet by mouth daily.  90 tablet  3  . metFORMIN (GLUCOPHAGE) 500 MG tablet Take 500 mg by mouth 2 (two) times daily with a meal.        . metoprolol (LOPRESSOR) 50 MG tablet TAKE 1 TABLET TWICE DAILY AS NEEDED FOR BLOOD PRESSURE.  60 tablet  6  . ONE TOUCH ULTRA TEST test strip        No current facility-administered medications on file prior to visit.    Allergies  Allergen Reactions  . Lisinopril     Cough   . Macrodantin  Unknown  . Penicillins     Unknown  . Sulfa Drugs Cross Reactors     Unknown  . Zithromax [Azithromycin Dihydrate]     Unknown    Past Medical History  Diagnosis Date  . PVC (premature ventricular contraction)   . Hypertension   . Hyperlipidemia   . Anxiety   . Sinus bradycardia on ECG   . 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 Wilburn Cornelia.  . Mild aortic stenosis     per echo in 2010  . TIA (transient ischemic attack)   . Diabetes mellitus without complication   . Sleep apnea     Past Surgical History  Procedure Laterality Date  . Squamous cell carcinoma excision      tongue    History  Smoking status  . Former Smoker  . Quit date: 06/25/1975  Smokeless tobacco  . Not on file    History  Alcohol Use No    Family History  Problem Relation Age of Onset  . Heart disease Mother   . Heart attack Mother   . Heart disease Father   . Heart attack Father     Reviw of Systems:  Reviewed in the HPI.  All other systems are negative.  Physical Exam: Blood  pressure 122/60, pulse 58, height $RemoveBe'5\' 10"'VFkHUgWeb$  (1.778 m), weight 195 lb 8 oz (88.678 kg). General: Well developed, well nourished, in no acute distress.  Head: Normocephalic, atraumatic, sclera non-icteric, mucus membranes are moist,   Neck: Supple. Soft right carotid bruit. JVD not elevated.  Lungs: Clear bilaterally   Heart: RRR with S1 S2. There is  2 /6 systolic murmur.  Abdomen: Soft, non-tender, non-distended with normoactive bowel sounds. No hepatomegaly. No rebound/guarding. No obvious abdominal masses.  Msk:  Strength and tone appear normal for age.  Extremities: No clubbing or cyanosis. No edema.  Distal pedal pulses are 2+ and equal bilaterally.  Neuro: Alert and oriented X 3. Moves all extremities spontaneously.  Psych:  Responds to questions appropriately with a normal affect.  ECG:    October 25, 2013:   Sinus brady at 44.  NS ST abn. Assessment / Plan:

## 2013-10-25 NOTE — Assessment & Plan Note (Signed)
His aortic valve stenosis is mild/moderate and really has not changed. We'll consider getting another echocardiogram in the next year so. He remains completely symptom-free.

## 2013-10-25 NOTE — Patient Instructions (Signed)
Your physician recommends that you return for lab work in:  3 months  BMP and fasting labs   Your physician wants you to follow-up in: 6 months with Dr. Acie Fredrickson. You will receive a reminder letter in the mail two months in advance. If you don't receive a letter, please call our office to schedule the follow-up appointment.   Call if you are interested in starting Pralulent.

## 2013-10-25 NOTE — Assessment & Plan Note (Signed)
Alexis Griffith is doing well. He was unable to tolerate the Pravachol. He is interested in trying the injectable Praluent .  Will recheck his lipids in 3 months. I'll see him in 6 months and we'll discuss changes in his medication.

## 2013-11-27 ENCOUNTER — Other Ambulatory Visit: Payer: Medicare Other

## 2013-12-20 ENCOUNTER — Ambulatory Visit (INDEPENDENT_AMBULATORY_CARE_PROVIDER_SITE_OTHER): Payer: Medicare Other | Admitting: Nurse Practitioner

## 2013-12-20 ENCOUNTER — Encounter: Payer: Self-pay | Admitting: Nurse Practitioner

## 2013-12-20 VITALS — BP 108/56 | HR 66 | Temp 98.0°F | Ht 70.0 in | Wt 194.0 lb

## 2013-12-20 DIAGNOSIS — R7982 Elevated C-reactive protein (CRP): Secondary | ICD-10-CM

## 2013-12-20 DIAGNOSIS — M25552 Pain in left hip: Secondary | ICD-10-CM

## 2013-12-20 DIAGNOSIS — Z8739 Personal history of other diseases of the musculoskeletal system and connective tissue: Secondary | ICD-10-CM

## 2013-12-20 DIAGNOSIS — R262 Difficulty in walking, not elsewhere classified: Secondary | ICD-10-CM

## 2013-12-20 DIAGNOSIS — Z8679 Personal history of other diseases of the circulatory system: Secondary | ICD-10-CM

## 2013-12-20 DIAGNOSIS — M609 Myositis, unspecified: Secondary | ICD-10-CM

## 2013-12-20 DIAGNOSIS — M25551 Pain in right hip: Secondary | ICD-10-CM

## 2013-12-20 DIAGNOSIS — M25519 Pain in unspecified shoulder: Secondary | ICD-10-CM

## 2013-12-20 DIAGNOSIS — G459 Transient cerebral ischemic attack, unspecified: Secondary | ICD-10-CM

## 2013-12-20 NOTE — Progress Notes (Addendum)
PATIENT: Alexis Griffith DOB: March 03, 1940  REASON FOR VISIT: routine follow up for TIA  HISTORY FROM: patient  HISTORY OF PRESENT ILLNESS: [name] is a 73 year old Caucasian gentleman who comes in today for followup of TIA. He was a former patient of Dr. Erling Cruz. History from patient old chart.   History of TIA dates back to 08/20/2002 in which he had a sudden onset of gait disorder. 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 There were findings the proximal stenosis in the left fatigue or coming off the subclavian and stenosis of the right ICA. He has history of anxiety and been on benzodiazepines for over 40 years. 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 Wilburn Cornelia.  in late 2013, he developed jaw claudication and visual field loss. He underwent a left temporal artery biopsy by Dr. Wilburn Cornelia 12/31/2011 and was started on Prednisone $RemoveBefor'60mg'CnHtNQJNDHbT$  Wed after surgery 12/31/2011 he noted staggering gait and went to the [facility] ER. Imaging was again negative for stroke. 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 He vision improved on Prednisone and sees opthamologist (Dr. Kathrin Penner) every 3 months.  He saw Dr. Charlestine Night who adjusted his Prednisone to $RemoveBefor'40mg'ETGqYautkpeB$  daily. On Prednisone his blood sugar became uncontrolled and his PCP Dr. Osborne Casco started him on insulin.  UPDATE: 07/27/12  [name] states he is doing better, Dr. Osborne Casco recently reduced his prednisone to 15 mg from 20 mg daily. His vision is back to normal and he has a 3 month appt. With opthamologist tomorrow. Carotid dopplers recently done at [facility], unchanged. No neurological deficits. On Aggrenox for secondary stroke prevention. No side effects reported, no signs of  bleeding. Patient reports blood pressure is well controlled, and is 122/65 in office today. Most recent hemoglobin A1c is 5.6. Lipids are well controlled.  Update 06/20/2013 ; He returns for followup after last visit almost a year ago. He continues to do well without recurrence stroke or TIA symptoms now for several years. He has tapered his prednisone and has completely stopped it 2 weeks ago. His ESR is having quite stable for a year and a half. He has no active symptoms of temporal arteritis in the form of jaw claudication, scalp tenderness, headaches or vision loss. He is complaining of muscle aches and arthralgias he was previously on Lipitor in which has been switched to Pravachol 40 mg 2 weeks ago by his primary care physician.   Update 12/20/13 (LL): [name] returns for follow up for TIA, accompanied by his wife. He states he has not had any stroke or tia symptoms. He is however having increased stiffness at the shoulders and hips that is worst in the morning.  He states it is difficult for him to get out of bed or stand from a seated position.  He has pain in the shoulders, hips and wrists and decreased ROM. He denies recurrent scalp or jaw pain or vision changes. He has attributed the pain and stiffness to his statin and quit taking it 3 weeks ago. He has not had a recent sed rate checked. He has also been having difficulty sleeping and has low mood.   REVIEW OF SYSTEMS: Full 14 system review of systems performed and notable  only for: fatigue, murmur, apnea, snoring, frequency of urination, urgency, joint pain, aching muscles, walking difficulty, depression.   ALLERGIES: Allergies  Allergen Reactions  . Lisinopril     Cough   . Macrodantin     Unknown  . Penicillins     Unknown  . Sulfa Drugs Cross Reactors     Unknown  . Zithromax [Azithromycin Dihydrate]     Unknown    HOME MEDICATIONS: Outpatient Prescriptions Prior to Visit  Medication Sig Dispense Refill  . AGGRENOX 25-200  MG per 12 hr capsule TAKE (1) CAPSULE TWICE DAILY.  60 capsule  6  . buPROPion (WELLBUTRIN SR) 150 MG 12 hr tablet Take 150 mg by mouth daily.        . Choline Fenofibrate (TRILIPIX) 135 MG capsule Take 135 mg by mouth daily.        . clonazePAM (KLONOPIN) 0.5 MG tablet Take 0.5 mg by mouth 4 (four) times daily.        . Coenzyme Q10 200 MG capsule Take 200 mg by mouth daily.      Marland Kitchen doxazosin (CARDURA) 4 MG tablet Take 4 mg by mouth at bedtime.        Marland Kitchen losartan-hydrochlorothiazide (HYZAAR) 100-12.5 MG per tablet Take 1 tablet by mouth daily.  90 tablet  3  . metFORMIN (GLUCOPHAGE) 500 MG tablet Take 500 mg by mouth 2 (two) times daily with a meal.        . metoprolol (LOPRESSOR) 50 MG tablet TAKE 1 TABLET TWICE DAILY AS NEEDED FOR BLOOD PRESSURE.  60 tablet  6  . ONE TOUCH ULTRA TEST test strip       . sitaGLIPtin (JANUVIA) 100 MG tablet Take 100 mg by mouth daily.       No facility-administered medications prior to visit.    PHYSICAL EXAM Filed Vitals:   12/20/13 1319  BP: 108/56  Pulse: 66  Temp: 98 F (36.7 C)  TempSrc: Oral  Height: $Remove'5\' 10"'YAJDhhZ$  (1.778 m)  Weight: 194 lb (87.998 kg)   Body mass index is 27.84 kg/(m^2).  Physical Exam  Awake alert. Afebrile. Head is nontraumatic. Neck is supple without bruit. Hearing is normal. Cardiac exam no murmur or gallop. Lungs are clear to auscultation. Distal pulses are well felt. No temporal artery tenderness bilaterally.  Neurologic Examination:  Mental Status:  Alert, oriented, thought content appropriate. Speech fluent without evidence of aphasia. Able to follow 3 step commands without difficulty.  Cranial Nerves:  II: Visual fields grossly normal, pupils equal, round, reactive to light and accommodation  III,IV, VI: ptosis not present, extra-ocular motions intact bilaterally  V,VII: smile symmetric, facial light touch sensation normal bilaterally  VIII: hearing normal bilaterally  IX,X: gag reflex present  XI: bilateral shoulder  shrug symmetric but elicits pain XII: midline tongue extension  Motor:  Right : Upper extremity 4/5 Left: Upper extremity 4/5, with proximal pain at the shoulders Lower extremity 5/5 Lower extremity 5/5, with proximal pain at the hips Tone and bulk: normal tone throughout; no atrophy noted  Sensory: Pinprick and light touch intact throughout, bilaterally  Deep Tendon Reflexes: 2+ and symmetric throughout  Cerebellar: normal finger-to-nose  Gait: slow to rise from seated position, must push off with hands on armrests. Once up, gait is normal but slow.   CTA NECK 12/31/11  Progression of atherosclerotic disease and stenosis in the proximal internal carotid artery bilaterally. There is now a 65% stenosis in the proximal right internal carotid artery and a 45% diameter  stenosis in the proximal left internal carotid artery. 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 Left vertebral artery is widely patent.  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  Atherosclerotic disease in the cavernous carotid bilaterally. No other significant intracranial stenosis.  Carotid Doppler 07/05/13 Evidence of <40% bilateral internal carotid artery stenosis. Bilateral vertebral artery is antegrade. Right external carotid artery stenosis. No significant change in comparison to the last exam.  ASSESSMENT: Mr. Scott Vanderveer is a 73 year old Caucasian gentleman who is being seen for follow up of TIAs, most recently 12/31/11, and giant cell arteritis. 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   PLAN: Continue Aggrenox for secondary stroke prevention.  Strict risk factor control with BP goal less than 130/80, Hgb A1c <6.5, and LDL <70.  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 Follow up in 3 months, sooner as needed.  Orders Placed This Encounter  Procedures  . Sedimentation Rate  . C-reactive Protein  . CBC With differential/Platelet   Addendum: CRP is elevated at 17.6 and Sed rate is 17, but patient indicates it was only 3 when checked in May. I will make referral to Rheumatology, Dr. Berna Bue.  Rudi Rummage Harold Mattes, MSN, FNP-BC, A/GNP-C 12/20/2013, 7:23 PM [facility] [address] [place], [place] [address] [phone]  Note: This document was prepared with digital dictation and possible smart phrase technology. Any transcriptional errors that result from this process are unintentional.

## 2013-12-20 NOTE — Patient Instructions (Addendum)
Continue Aggrenox for secondary stroke prevention.  Strict risk factor control with BP goal less than 130/80, Hgb A1c <6.5, and LDL <70.  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    Polymyalgia Rheumatica Polymyalgia rheumatica (also called PMR or polymyalgia) is a rheumatologic (arthritic) condition that causes pain and morning stiffness in your neck, shoulders, and hips. It is an inflammatory condition. In some people, inflammation of certain structures in the shoulder, hips, or other joints can be seen on special testing. It does not cause joint destruction, as occurs in other arthritic conditions. It usually occurs after 73 years of age, and is more common as you age. It can be confused with several other diseases, but it is usually easily treated. People with PMR often have, or can develop, a more severe rheumatologic condition called giant cell arteritis (also called CGA or temporal arteritis).  CAUSES  The exact cause of PMR is not known.   There are genetic factors involved.  Viruses have been suspected in the cause of PMR. This has not been proven. SYMPTOMS   Aching, pain, and morning stiffness your neck, both shoulders, or both hips.  Symptoms usually start slowly and build gradually.  Morning stiffness usually lasts at least 30 minutes.  Swelling and tenderness in other joints of the arms, hands, legs, and feet may occur.  Swelling and inflammation in the wrists can cause nerve inflammation at the wrist (carpal tunnel syndrome).  You may also have low grade fever, fatigue, weakness, decreased appetite and weight loss. DIAGNOSIS   Your caregiver may suspect that you have PMR based on your description of your symptoms and on your exam.  Your caregiver will examine you to be sure you do not have diseases that can be confused with PMR. These diseases include rheumatoid arthritis,  fibromyalgia, or thyroid disease.  Your caregiver should check for signs of giant cell arteritis. This can cause serious complications such as blindness.  Lab tests can help confirm that you have PMR and not other diseases, but are sometimes inconclusive.  X-rays cannot show PMR. However, it can identify other diseases like rheumatoid arthritis. Your caregiver may have you see a specialist in arthritis and inflammatory diseases (rheumatologist). TREATMENT  The goal of treatment is relief of symptoms. Treatment does not shorten the course of the illness or prevent complications. With proper treatment, you usually feel better almost right away.   The initial treatment of PMR is usually a cortisone (steroid) medication. Your caregiver will help determine a starting dose. The dose is gradually reduced every few weeks to months. Treatment usually lasts one to three years.  Other stronger medications are rarely needed. They will only be prescribed if your symptoms do not get better on cortisone medication alone, or if they recur as the dose is reduced.  Cortisone medication can have different side effects. With the doses of cortisone needed for PMR, the side effects can affect bones and joints, blood sugar control in diabetes, and mood changes. Discuss this with your caregiver.  Your caregiver will evaluate you regularly during your treatment. They will do this in order to assess progress and to check for complications of the illness or treatment.  Physical therapy is sometimes useful. This is especially true if your joints are still stiff after other symptoms have improved. HOME CARE INSTRUCTIONS   Follow your caregiver's instructions. Do not change your dose of cortisone medication  on your own.  Keep your appointments for follow-up lab tests and caregiver visits. Your lab tests need to be monitored. You must get checked periodically for giant cell arteritis.  Follow your caregiver's guidance  regarding physical activity (usually no restrictions are needed) or physical therapy.  Your caregiver may have instructions to prevent or check for side effects from cortisone medication (including bone density testing or treatment). Follow their instructions carefully. SEEK MEDICAL CARE IF:   You develop any side effects from treatment. Side effects can include:  Elevated blood pressure.  High blood sugar (or worsening of diabetes, if you are diabetic).  Difficulty fighting off infections.  Weight gain.  Weakness of the bones (osteoporosis).  Your aches, pains, morning stiffness, or other symptoms get worse with time. This is especially true after your dose of cortisone is reduced.  You develop new joint symptoms (pain, swelling, etc.) SEEK IMMEDIATE MEDICAL CARE IF:   You develop a severe headache.  You start vomiting.  You have problems with your vision.  You have an oral temperature above 102 F (38.9 C), not controlled by medicine. Document Released: 03/26/2004 Document Revised: 02/03/2012 Document Reviewed: 07/09/2008 [facility] Patient Information 2015 [place], [place]. This information is not intended to replace advice given to you by your health care provider. Make sure you discuss any questions you have with your health care provider.

## 2013-12-21 LAB — CBC WITH DIFFERENTIAL
Basophils Absolute: 0 10*3/uL (ref 0.0–0.2)
Basos: 0 %
Eos: 2 %
Eosinophils Absolute: 0.1 10*3/uL (ref 0.0–0.4)
HCT: 39.4 % (ref 37.5–51.0)
Hemoglobin: 13.5 g/dL (ref 12.6–17.7)
Immature Grans (Abs): 0 10*3/uL (ref 0.0–0.1)
Immature Granulocytes: 0 %
Lymphocytes Absolute: 2.2 10*3/uL (ref 0.7–3.1)
Lymphs: 30 %
MCH: 26.6 pg (ref 26.6–33.0)
MCHC: 34.3 g/dL (ref 31.5–35.7)
MCV: 78 fL — ABNORMAL LOW (ref 79–97)
Monocytes Absolute: 0.7 10*3/uL (ref 0.1–0.9)
Monocytes: 10 %
Neutrophils Absolute: 4.3 10*3/uL (ref 1.4–7.0)
Neutrophils Relative %: 58 %
Platelets: 209 10*3/uL (ref 150–379)
RBC: 5.07 x10E6/uL (ref 4.14–5.80)
RDW: 14.8 % (ref 12.3–15.4)
WBC: 7.4 10*3/uL (ref 3.4–10.8)

## 2013-12-21 LAB — SEDIMENTATION RATE: Sed Rate: 17 mm/hr (ref 0–30)

## 2013-12-21 LAB — C-REACTIVE PROTEIN: CRP: 17.6 mg/L — ABNORMAL HIGH (ref 0.0–4.9)

## 2013-12-21 NOTE — Progress Notes (Signed)
I agree with the above plan 

## 2013-12-22 ENCOUNTER — Other Ambulatory Visit: Payer: Self-pay | Admitting: Nurse Practitioner

## 2013-12-22 ENCOUNTER — Telehealth: Payer: Self-pay | Admitting: Nurse Practitioner

## 2013-12-22 NOTE — Telephone Encounter (Signed)
Recent labs showed Sed Rate of 17 and CRP 17.6.  Discussed results with patient's wife, I would like him to see a Rheumatologist due to his symptoms and high CRP.  Wife was not sure if he had seen one in the past and will get him to call back.

## 2013-12-22 NOTE — Addendum Note (Signed)
Addended by: Charlott Holler E on: 12/22/2013 02:06 PM   Modules accepted: Orders

## 2014-01-04 ENCOUNTER — Other Ambulatory Visit: Payer: Self-pay | Admitting: Cardiovascular Disease

## 2014-01-30 ENCOUNTER — Other Ambulatory Visit (INDEPENDENT_AMBULATORY_CARE_PROVIDER_SITE_OTHER): Payer: Medicare Other

## 2014-01-30 DIAGNOSIS — E785 Hyperlipidemia, unspecified: Secondary | ICD-10-CM

## 2014-01-31 LAB — BASIC METABOLIC PANEL
BUN/Creatinine Ratio: 15 (ref 10–22)
BUN: 17 mg/dL (ref 8–27)
CO2: 25 mmol/L (ref 18–29)
Calcium: 9.5 mg/dL (ref 8.6–10.2)
Chloride: 101 mmol/L (ref 97–108)
Creatinine, Ser: 1.11 mg/dL (ref 0.76–1.27)
GFR calc Af Amer: 76 mL/min/{1.73_m2} (ref >59)
GFR calc non Af Amer: 66 mL/min/{1.73_m2} (ref >59)
Glucose: 112 mg/dL — ABNORMAL HIGH (ref 65–99)
Potassium: 4.1 mmol/L (ref 3.5–5.2)
Sodium: 141 mmol/L (ref 134–144)

## 2014-01-31 LAB — HEPATIC FUNCTION PANEL
ALT: 14 IU/L (ref 0–44)
AST: 12 IU/L (ref 0–40)
Albumin: 4.4 g/dL (ref 3.5–4.8)
Alkaline Phosphatase: 32 IU/L — ABNORMAL LOW (ref 39–117)
Bilirubin, Direct: 0.1 mg/dL (ref 0.00–0.40)
Total Bilirubin: 0.3 mg/dL (ref 0.0–1.2)
Total Protein: 6.5 g/dL (ref 6.0–8.5)

## 2014-01-31 LAB — LIPID PANEL
Chol/HDL Ratio: 2.7 ratio units (ref 0.0–5.0)
Cholesterol, Total: 148 mg/dL (ref 100–199)
HDL: 55 mg/dL (ref >39)
LDL Calculated: 71 mg/dL (ref 0–99)
Triglycerides: 108 mg/dL (ref 0–149)
VLDL Cholesterol Cal: 22 mg/dL (ref 5–40)

## 2014-02-12 ENCOUNTER — Other Ambulatory Visit: Payer: Self-pay | Admitting: Neurology

## 2014-03-27 DIAGNOSIS — R972 Elevated prostate specific antigen [PSA]: Secondary | ICD-10-CM | POA: Diagnosis not present

## 2014-03-27 DIAGNOSIS — N402 Nodular prostate without lower urinary tract symptoms: Secondary | ICD-10-CM | POA: Diagnosis not present

## 2014-04-05 ENCOUNTER — Ambulatory Visit (INDEPENDENT_AMBULATORY_CARE_PROVIDER_SITE_OTHER): Payer: Medicare Other | Admitting: Cardiovascular Disease

## 2014-04-05 ENCOUNTER — Encounter: Payer: Self-pay | Admitting: Cardiovascular Disease

## 2014-04-05 VITALS — BP 140/60 | HR 68 | Ht 70.0 in | Wt 197.5 lb

## 2014-04-05 DIAGNOSIS — I493 Ventricular premature depolarization: Secondary | ICD-10-CM | POA: Diagnosis not present

## 2014-04-05 DIAGNOSIS — I350 Nonrheumatic aortic (valve) stenosis: Secondary | ICD-10-CM | POA: Diagnosis not present

## 2014-04-05 NOTE — Progress Notes (Signed)
Cardiology Office Note   Date:  04/05/2014   ID:  [name], [name] [birth year], MRN [id number]  PCP:  Alexis Pao, MD  Cardiologist:   Alexis Fredrickson Wonda Cheng, MD   Chief Complaint  Patient presents with  . other    6 month follow up. Meds reviewed by the pt's med list. "doing well."    Problem List  1. Premature ventricular contractions 2. Anxiety 3. Hypertension 4. Hyperlipidemia 5. 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 Bilateral carotid artery disease - moderate 7. 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 TIA 9. Mild - moderate Aortic stenosis   History of Present Illness: Alexis Griffith is a 74 y.o. male who presents for further evaluation of his palpitations and HTN.  He is having some muscle aches - especially in the morning.  Seems to be bilateral shoulder pain. He has held the pravachol for several weeks but the pain did not resolved. BP has been a little high. Tried taking 1 1/2 of the Losartan / HCTZ and has occasionally takes extra metoprolol.    Past Medical History  Diagnosis Date  . PVC (premature ventricular contraction)   . Hypertension   . Hyperlipidemia   . Anxiety   . Sinus bradycardia on ECG   . 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 Wilburn Griffith.  . Mild aortic stenosis     per echo in 2010  . TIA (transient ischemic attack)   . Diabetes mellitus without complication   . Sleep apnea     Past Surgical History  Procedure Laterality Date  . Squamous cell carcinoma excision      tongue     Current Outpatient Prescriptions  Medication Sig Dispense Refill  . AGGRENOX 25-200 MG per 12 hr capsule TAKE (1) CAPSULE TWICE DAILY. 60 capsule 3  . buPROPion (WELLBUTRIN SR) 150 MG 12 hr tablet Take 150 mg by mouth daily.      . Choline Fenofibrate (TRILIPIX) 135 MG capsule Take 135 mg by mouth daily.      . clonazePAM (KLONOPIN) 0.5 MG tablet Take 0.5 mg by mouth 4 (four) times daily.        . Coenzyme Q10 200 MG capsule Take 200 mg by mouth daily.    Marland Kitchen doxazosin (CARDURA) 4 MG tablet Take 4 mg by mouth at bedtime.      Marland Kitchen losartan-hydrochlorothiazide (HYZAAR) 100-12.5 MG per tablet Take one and half tablet daily.    . metFORMIN (GLUCOPHAGE) 500 MG tablet Take 500 mg by mouth 2 (two) times daily with a meal.      . metoprolol (LOPRESSOR) 50 MG tablet TAKE 1 TABLET TWICE DAILY AS NEEDED FOR BLOOD PRESSURE. 60 tablet 3  . ONE TOUCH ULTRA TEST test strip     . pravastatin (PRAVACHOL) 20 MG tablet Take 20 mg by mouth daily.    . sitaGLIPtin (JANUVIA) 100 MG tablet Take 100 mg by mouth daily.     No current facility-administered medications for this visit.    Allergies:   Lisinopril; Macrodantin; Penicillins; Sulfa drugs cross reactors; and Zithromax    Social History:  The patient  reports that he quit smoking about 38 years ago. He has never used smokeless tobacco. He reports that he does not drink alcohol or use illicit drugs.   Family History:  The patient's family history includes Heart attack in his father and mother; Heart disease in his father and mother.  ROS:  Please see the history of present illness.    Review of Systems: Constitutional:  denies fever, chills, diaphoresis, appetite change and fatigue.  HEENT: denies photophobia, eye pain, redness, hearing loss, ear pain, congestion, sore throat, rhinorrhea, sneezing, neck pain, neck stiffness and tinnitus.  Respiratory: denies SOB, DOE, cough, chest tightness, and wheezing.  Cardiovascular: denies chest pain, palpitations and leg swelling.  Gastrointestinal: denies nausea, vomiting, abdominal pain, diarrhea, constipation, blood in stool.  Genitourinary: denies dysuria, urgency, frequency, hematuria, flank pain and difficulty urinating.  Musculoskeletal: denies  myalgias, back pain, joint swelling, arthralgias and gait problem.   Skin: denies pallor, rash and wound.  Neurological: denies dizziness, seizures,  syncope, weakness, light-headedness, numbness and headaches.   Hematological: denies adenopathy, easy bruising, personal or family bleeding history.  Psychiatric/ Behavioral: denies suicidal ideation, mood changes, confusion, nervousness, sleep disturbance and agitation.       All other systems are reviewed and negative.    PHYSICAL EXAM: VS:  BP 140/60 mmHg  Pulse 68  Ht 5\' 10"  (1.778 m)  Wt 197 lb 8 oz (89.585 kg)  BMI 28.34 kg/m2 , BMI Body mass index is 28.34 kg/(m^2). GEN: Well nourished, well developed, in no acute distress HEENT: normal Neck: no JVD, carotid bruits, or masses Cardiac: RRR; no murmurs, rubs, or gallops,no edema  Respiratory:  clear to auscultation bilaterally, normal work of breathing GI: soft, nontender, nondistended, + BS MS: no deformity or atrophy Skin: warm and dry, no rash Neuro:  Strength and sensation are intact Psych: normal   EKG:  EKG is ordered today. The ekg ordered today demonstrates NSR at 68.  Nonspecific ST abn.    Recent Labs: 12/20/2013: Hemoglobin 13.5; Platelets 209 01/30/2014: ALT 14; BUN 17; Creatinine 1.11; Potassium 4.1; Sodium 141    Lipid Panel    Component Value Date/Time   CHOL 152 12/30/2011 2107   TRIG 108 01/30/2014 0917   HDL 55 01/30/2014 0917   HDL 35* 12/30/2011 2107   CHOLHDL 2.7 01/30/2014 0917   CHOLHDL 4.3 12/30/2011 2107   VLDL 19 12/30/2011 2107   LDLCALC 71 01/30/2014 0917   LDLCALC 98 12/30/2011 2107      Wt Readings from Last 3 Encounters:  04/05/14 197 lb 8 oz (89.585 kg)  12/20/13 194 lb (87.998 kg)  10/25/13 195 lb 8 oz (88.678 kg)      Other studies Reviewed: Additional studies/ records that were reviewed today include: . Review of the above records demonstrates:    ASSESSMENT AND PLAN:  1. Premature ventricular contractions 2. Anxiety 3. Hypertension 4. Hyperlipidemia 5. 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 Bilateral carotid artery  disease - moderate 7. 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 TIA 9. Mild - moderate Aortic stenosis   Current medicines are reviewed at length with the patient today.  The patient does not have concerns regarding medicines.  The following changes have been made:  no change   Disposition:   FU with me in 6 months     Signed, Lorriane Dehart, Wonda Cheng, MD  04/05/2014 3:28 PM    [facility] [address], [place], [place]  [address] Phone: [phone]; Fax: [phone]

## 2014-04-05 NOTE — Patient Instructions (Addendum)
[place] - by [place] on your current medications.   Your physician recommends that you schedule a follow-up appointment in: 6 months.  Your physician has requested that you have an echocardiogram. Echocardiography is a painless test that uses sound waves to create images of your heart. It provides your doctor with information about the size and shape of your heart and how well your heart's chambers and valves are working. This procedure takes approximately one hour. There are no restrictions for this procedure.

## 2014-04-17 ENCOUNTER — Other Ambulatory Visit: Payer: Self-pay

## 2014-04-17 ENCOUNTER — Other Ambulatory Visit (INDEPENDENT_AMBULATORY_CARE_PROVIDER_SITE_OTHER): Payer: Medicare Other

## 2014-04-17 DIAGNOSIS — I350 Nonrheumatic aortic (valve) stenosis: Secondary | ICD-10-CM | POA: Diagnosis not present

## 2014-04-17 DIAGNOSIS — I493 Ventricular premature depolarization: Secondary | ICD-10-CM | POA: Diagnosis not present

## 2014-04-24 DIAGNOSIS — Z85810 Personal history of malignant neoplasm of tongue: Secondary | ICD-10-CM | POA: Diagnosis not present

## 2014-04-26 ENCOUNTER — Telehealth: Payer: Self-pay | Admitting: *Deleted

## 2014-04-26 DIAGNOSIS — I10 Essential (primary) hypertension: Secondary | ICD-10-CM

## 2014-04-26 MED ORDER — VALSARTAN-HYDROCHLOROTHIAZIDE 160-12.5 MG PO TABS
1.0000 | ORAL_TABLET | Freq: Every day | ORAL | Status: DC
Start: 2014-04-26 — End: 2014-05-11

## 2014-04-26 NOTE — Telephone Encounter (Signed)
Informed patient of Dr. Lanny Hurst response  Patient scheduled for lab in 3 weeks  Medication changed as ordered  Patient verbalized understanding   Instructed patient to call back and schedule appt with Dr. Acie Fredrickson if blood pressure does not begin to trend down

## 2014-04-26 NOTE — Telephone Encounter (Signed)
Pt calling stating he was just down here for an echo and then a couple weeks before he saw Dr Acie Fredrickson Has question on main BP medication  losartan He is having problems in keeping down his systolic number.  Is asking for something a bit stronger Normally taking two a day, now is taking 3-4 a day.  It was suppose to control that number but it doesn't seem to be doing that.   Please advise.

## 2014-04-26 NOTE — Telephone Encounter (Signed)
I think he may do better on Valsartan HCTZ DC the Losartan HCT Start Valsartan 160 mg / HCTZ 12. 5 mg a day. We can double the valsartan / HCT if needed.  Check BMP in 3 weeks.  I can see him in as a work in if needed.  I may have slots in my [place] schedule

## 2014-04-26 NOTE — Telephone Encounter (Signed)
He is having problems in keeping down his systolic number.  SBP has been 140-160 on average SBP 60-70 before he takes his medication in the am  After he takes his medication BP trends down to around 130-140/65 He denies having any symptoms associated with elevated BP  His heart rate is always between 60-65 He is taking 3 lopressor tablets daily sometimes to control his BP  He is still taking his Hyzaar once daily  He wants to know if Dr, Acie Fredrickson thinks he should make any medication changes to better control his BP

## 2014-04-26 NOTE — Telephone Encounter (Signed)
Thanks Nicole

## 2014-04-30 ENCOUNTER — Telehealth: Payer: Self-pay | Admitting: *Deleted

## 2014-04-30 NOTE — Telephone Encounter (Signed)
Patient returned call from [place].  Please call back when available.

## 2014-04-30 NOTE — Telephone Encounter (Signed)
LVM 2/29 

## 2014-04-30 NOTE — Telephone Encounter (Signed)
Pt calling stating he talked to [place] about a change in medication and nurse called telling him about he changes but  [place] has not received the new changes to pt medication.  Please advise.

## 2014-05-09 NOTE — Telephone Encounter (Signed)
LVM 3/9

## 2014-05-11 ENCOUNTER — Other Ambulatory Visit: Payer: Self-pay

## 2014-05-11 MED ORDER — VALSARTAN-HYDROCHLOROTHIAZIDE 160-12.5 MG PO TABS: 1.0000 | ORAL_TABLET | Freq: Every day | ORAL | Status: DC

## 2014-05-11 NOTE — Telephone Encounter (Signed)
Medication resent as requested was not received the first time

## 2014-05-11 NOTE — Telephone Encounter (Signed)
Patient stated his pharmacy did not receive the Valsartan HCT  Reordered medication  Prescheduled labs for 3 weeks after start

## 2014-05-16 ENCOUNTER — Other Ambulatory Visit: Payer: Self-pay | Admitting: Cardiovascular Disease

## 2014-05-17 ENCOUNTER — Other Ambulatory Visit: Payer: Medicare Other

## 2014-06-01 ENCOUNTER — Other Ambulatory Visit: Payer: Medicare Other

## 2014-06-10 ENCOUNTER — Other Ambulatory Visit: Payer: Self-pay | Admitting: Neurology

## 2014-06-27 ENCOUNTER — Other Ambulatory Visit: Payer: Self-pay | Admitting: Cardiovascular Disease

## 2014-06-28 MED ORDER — METOPROLOL TARTRATE 50 MG PO TABS: ORAL_TABLET | ORAL | Status: DC

## 2014-06-28 NOTE — Telephone Encounter (Signed)
Ok to fill losartan / HCTZ

## 2014-06-28 NOTE — Telephone Encounter (Signed)
Pt requesting Refill on Losartan. Pt is aware that Losartan was D/C telephone note back 04/2014. Pt restarted losartan on his own due to him stopping Valsartan due to him getting dizzy when he had started it. Pt has questions about metoprolol as well due to him not having enough pills at the end of month due to Elevated BP.Pt is aware that I will forward message to nurse to contact him.  Please advise.

## 2014-06-28 NOTE — Addendum Note (Signed)
Addended by: Anselm Pancoast on: 06/28/2014 03:35 PM   Modules accepted: Orders, Medications

## 2014-06-28 NOTE — Telephone Encounter (Signed)
Alexis Griffith wanted to let us know he has already stopped the Valsartan and started the losartan hctz 100/12.5 mg  because the valsartan made him feel dizzy. The patient has felt better since started back on the losartan hctz. The patient is taking metoprolol 50 mg twice a day but occasionally will take it three times a day. Told the patient needs to have some blood work [place]) because he was to come in March but fail to do so.

## 2014-06-28 NOTE — Telephone Encounter (Signed)
Pt calling to see why he can't get the refill sent for losartan sent to [facility].

## 2014-06-28 NOTE — Telephone Encounter (Signed)
Rx sent for Losartan HCTZ 100/12.5 mg one tablet daily and a refill sent for metoprolol.

## 2014-07-09 ENCOUNTER — Other Ambulatory Visit: Payer: Self-pay | Admitting: Neurology

## 2014-07-12 ENCOUNTER — Other Ambulatory Visit (INDEPENDENT_AMBULATORY_CARE_PROVIDER_SITE_OTHER): Payer: Medicare Other | Admitting: *Deleted

## 2014-07-12 DIAGNOSIS — I10 Essential (primary) hypertension: Secondary | ICD-10-CM

## 2014-07-13 LAB — BASIC METABOLIC PANEL
BUN/Creatinine Ratio: 17 (ref 10–22)
BUN: 20 mg/dL (ref 8–27)
CO2: 22 mmol/L (ref 18–29)
Calcium: 9.7 mg/dL (ref 8.6–10.2)
Chloride: 97 mmol/L (ref 97–108)
Creatinine, Ser: 1.15 mg/dL (ref 0.76–1.27)
GFR calc Af Amer: 73 mL/min/{1.73_m2} (ref >59)
GFR calc non Af Amer: 63 mL/min/{1.73_m2} (ref >59)
Glucose: 110 mg/dL — ABNORMAL HIGH (ref 65–99)
Potassium: 4 mmol/L (ref 3.5–5.2)
Sodium: 140 mmol/L (ref 134–144)

## 2014-07-16 ENCOUNTER — Ambulatory Visit: Payer: Self-pay | Admitting: Nurse Practitioner

## 2014-07-16 ENCOUNTER — Ambulatory Visit (INDEPENDENT_AMBULATORY_CARE_PROVIDER_SITE_OTHER): Payer: Medicare Other | Admitting: Nurse Practitioner

## 2014-07-16 ENCOUNTER — Encounter: Payer: Self-pay | Admitting: Nurse Practitioner

## 2014-07-16 VITALS — BP 143/63 | HR 71 | Ht 70.0 in | Wt 202.5 lb

## 2014-07-16 DIAGNOSIS — E785 Hyperlipidemia, unspecified: Secondary | ICD-10-CM

## 2014-07-16 DIAGNOSIS — I10 Essential (primary) hypertension: Secondary | ICD-10-CM

## 2014-07-16 DIAGNOSIS — G459 Transient cerebral ischemic attack, unspecified: Secondary | ICD-10-CM

## 2014-07-16 MED ORDER — ASPIRIN-DIPYRIDAMOLE ER 25-200 MG PO CP12: ORAL_CAPSULE | ORAL | Status: DC

## 2014-07-16 NOTE — Patient Instructions (Signed)
Continue Aggrenox for secondary stroke prevention Recheck carotid Doppler Risk factor control with blood pressure less than 130/80 today's reading 143/63 Hemoglobin A1c less than 6.5, last reported was 6 LDL less than 70 Exercises by walking for overall health and well-being Follow-up in 6 months

## 2014-07-16 NOTE — Progress Notes (Signed)
[facility]  PATIENT: Alexis Griffith DOB: 1940-06-12   REASON FOR VISIT: Follow-up for history of TIA , history of temporal arteritis, joint pains HISTORY FROM: Patient and wife    HISTORY OF PRESENT ILLNESS:[name] is a 74 year old Caucasian gentleman who comes in today for followup of TIA. He was a former patient of Dr. Erling Cruz. History from patient old chart.   History of TIA dates back to 08/20/2002 in which he had a sudden onset of gait disorder. 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 There were findings the proximal stenosis in the left fatigue or coming off the subclavian and stenosis of the right ICA. He has history of anxiety and been on benzodiazepines for over 40 years. 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 Wilburn Cornelia.  in late 2013, he developed jaw claudication and visual field loss. He underwent a left temporal artery biopsy by Dr. Wilburn Cornelia 12/31/2011 and was started on Prednisone $RemoveBefor'60mg'sJVRHDYpkmCu$  Wed after surgery 12/31/2011 he noted staggering gait and went to the [facility] ER. Imaging was again negative for stroke. 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 He vision improved on Prednisone and sees opthamologist (Dr. Kathrin Penner) every 3 months.  He saw Dr. Charlestine Night who adjusted his Prednisone to $RemoveBefor'40mg'yQDnySitldTP$  daily. On Prednisone his blood sugar became uncontrolled and his PCP Dr. Osborne Casco started him on insulin.  UPDATE: 07/27/12  [name] states he is doing better, Dr. Osborne Casco recently reduced his prednisone to 15 mg from 20 mg daily. His vision is back to normal and he has a 3 month appt. With opthamologist tomorrow. Carotid dopplers recently done at [facility], unchanged. No neurological  deficits. On Aggrenox for secondary stroke prevention. No side effects reported, no signs of bleeding. Patient reports blood pressure is well controlled, and is 122/65 in office today. Most recent hemoglobin A1c is 5.6. Lipids are well controlled.  Update 06/20/2013 ; He returns for followup after last visit almost a year ago. He continues to do well without recurrence stroke or TIA symptoms now for several years. He has tapered his prednisone and has completely stopped it 2 weeks ago. His ESR is having quite stable for a year and a half. He has no active symptoms of temporal arteritis in the form of jaw claudication, scalp tenderness, headaches or vision loss. He is complaining of muscle aches and arthralgias he was previously on Lipitor in which has been switched to Pravachol 40 mg 2 weeks ago by his primary care physician.   Update 07/16/14: Alexis Griffith returns for follow up for TIA, accompanied by his wife. He states he has not had any stroke or TIA symptoms. He has pain in the shoulders, hips and wrists and decreased ROM but this is better. He recently stopped his pravastatin due to muscle and joint aches. He denies recurrent scalp or jaw pain or vision changes with his history of temporal arteritis. He has also been having difficulty sleeping. He gets no regular exercise. He returns for reevaluation.   REVIEW OF SYSTEMS: Full 14 system review of systems performed and notable only for those listed, all others are neg:  Constitutional: Fatigue Cardiovascular: neg Ear/Nose/Throat: neg  Skin: neg Eyes: neg Respiratory: neg Gastroitestinal: Nausea Hematology/Lymphatic:  neg  Endocrine: Excessive thirst Musculoskeletal: Joint pain back pain, aching muscles Allergy/Immunology: neg Neurological: Speech difficulty Psychiatric: Depression and anxiety Sleep : Snoring ALLERGIES: Allergies  Allergen Reactions  . Lisinopril     Cough   . Macrodantin     Unknown  . Penicillins     Unknown  . Sulfa  Drugs Cross Reactors     Unknown  . Zithromax [Azithromycin Dihydrate]     Unknown    HOME MEDICATIONS: Outpatient Prescriptions Prior to Visit  Medication Sig Dispense Refill  . AGGRENOX 25-200 MG per 12 hr capsule TAKE (1) CAPSULE TWICE DAILY. 60 capsule 0  . buPROPion (WELLBUTRIN SR) 150 MG 12 hr tablet Take 150 mg by mouth daily.      . Choline Fenofibrate (TRILIPIX) 135 MG capsule Take 135 mg by mouth daily.      . clonazePAM (KLONOPIN) 0.5 MG tablet Take 0.5 mg by mouth 4 (four) times daily.      . Coenzyme Q10 200 MG capsule Take 200 mg by mouth daily.    Marland Kitchen doxazosin (CARDURA) 4 MG tablet Take 4 mg by mouth at bedtime.      Marland Kitchen losartan-hydrochlorothiazide (HYZAAR) 100-12.5 MG per tablet TAKE 1 TABLET ONCE DAILY. 30 tablet 3  . metFORMIN (GLUCOPHAGE) 500 MG tablet Take 500 mg by mouth 2 (two) times daily with a meal.      . metoprolol (LOPRESSOR) 50 MG tablet TAKE 1 TABLET TWICE DAILY AS NEEDED FOR BLOOD PRESSURE. 60 tablet 3  . ONE TOUCH ULTRA TEST test strip     . pravastatin (PRAVACHOL) 20 MG tablet Take 20 mg by mouth daily.    . sitaGLIPtin (JANUVIA) 100 MG tablet Take 100 mg by mouth daily.     No facility-administered medications prior to visit.    PAST MEDICAL HISTORY: Past Medical History  Diagnosis Date  . PVC (premature ventricular contraction)   . Hypertension   . Hyperlipidemia   . Anxiety   . Sinus bradycardia on ECG   . 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 Wilburn Cornelia.  . Mild aortic stenosis     per echo in 2010  . TIA (transient ischemic attack)   . Diabetes mellitus without complication   . Sleep apnea     PAST SURGICAL HISTORY: Past Surgical History  Procedure Laterality Date  . Squamous cell carcinoma excision      tongue    FAMILY HISTORY: Family History  Problem Relation Age of Onset  . Heart disease Mother   . Heart attack Mother   . Heart disease Father   . Heart attack Father     SOCIAL  HISTORY: History   Social History  . Marital Status: Married    Spouse Name: N/A  . Number of Children: 2  . Years of Education: N/A   Occupational History  . Not on file.   Social History Main Topics  . Smoking status: Former Smoker    Quit date: 06/25/1975  . Smokeless tobacco: Never Used  . Alcohol Use: No  . Drug Use: No  . Sexual Activity: Yes   Other Topics Concern  . Not on file   Social History Narrative   Patient lives at home with your wife.      PHYSICAL EXAM  Filed Vitals:   07/16/14 1326  BP: 143/63  Pulse: 71  Height: $Remove'5\' 10"'ehxMTfg$  (1.778 m)  Weight: 202 lb 8 oz (91.853 kg)   Body mass index  is 29.06 kg/(m^2). Awake alert. Afebrile. Head is nontraumatic.  Neck is supple without bruit. Hearing is normal.  Cardiac exam no murmur or gallop.  Lungs are clear to auscultation.  Distal pulses are well felt. No temporal artery tenderness bilaterally.  Neurologic Examination:  Mental Status:  Alert, oriented, thought content appropriate. Speech fluent without evidence of aphasia. Able to follow 3 step commands without difficulty.  Cranial Nerves:  II: Visual fields grossly normal, pupils equal, round, reactive to light and accommodation  III,IV, VI: ptosis not present, extra-ocular motions intact bilaterally  V,VII: smile symmetric, facial light touch sensation normal bilaterally  VIII: hearing normal bilaterally  IX,X: gag reflex present  XI: bilateral shoulder shrug symmetric  XII: midline tongue extension  Motor: Upper and lower extremity 5/5 , Tone and bulk: normal tone throughout; no atrophy noted  Sensory: Pinprick and light touch intact throughout, bilaterally  Deep Tendon Reflexes: 2+ and symmetric throughout  Cerebellar: normal finger-to-nose  Gait: slow to rise from seated position, must push off with hands on armrests. Once up, gait is normal but slow. No difficulty with turns tandem is steady. No assistive device   DIAGNOSTIC DATA  (LABS, IMAGING, TESTING) - I reviewed patient records, labs, notes, testing and imaging myself where available.  Lab Results  Component Value Date   WBC 7.4 12/20/2013   HGB 13.5 12/20/2013   HCT 39.4 12/20/2013   MCV 78* 12/20/2013   PLT 209 12/20/2013      Component Value Date/Time   NA 140 07/12/2014 1352   NA 137 12/30/2011 1416   K 4.0 07/12/2014 1352   CL 97 07/12/2014 1352   CO2 22 07/12/2014 1352   GLUCOSE 110* 07/12/2014 1352   GLUCOSE 175* 12/30/2011 1416   BUN 20 07/12/2014 1352   BUN 14 12/30/2011 1416   CREATININE 1.15 07/12/2014 1352   CALCIUM 9.7 07/12/2014 1352   PROT 6.5 01/30/2014 0917   PROT 7.0 12/30/2011 1416   ALBUMIN 3.5 12/30/2011 1416   AST 12 01/30/2014 0917   ALT 14 01/30/2014 0917   ALKPHOS 32* 01/30/2014 0917   BILITOT 0.3 01/30/2014 0917   GFRNONAA 63 07/12/2014 1352   GFRAA 73 07/12/2014 1352   Lab Results  Component Value Date   CHOL 148 01/30/2014   HDL 55 01/30/2014   LDLCALC 71 01/30/2014   TRIG 108 01/30/2014   CHOLHDL 2.7 01/30/2014    ASSESSMENT AND PLAN  74 y.o. year old male  has a past medical history of  Hypertension; Hyperlipidemia;  TIA (transient ischemic attack); Diabetes mellitus without complication; and Sleep apnea. here to follow-up. He has not had further stroke or TIA symptoms currently well-controlled on Aggrenox. The patient is a current patient of Dr. [name]  who is out of the office today . This note is sent to the work in doctor.     Continue Aggrenox for secondary stroke prevention Recheck carotid Doppler Risk factor control with blood pressure less than 130/80 today's reading 143/63 Hemoglobin A1c less than 6.5, last reported was 6 LDL less than 70 Exercises by walking for overall health and well-being Follow-up in 6 months Dennie Bible, [facility], [facility], APRN  [facility] [address], [place], [place] [address] [phone]

## 2014-07-18 NOTE — Progress Notes (Signed)
I have reviewed and agreed above plan. 

## 2014-08-07 ENCOUNTER — Ambulatory Visit (INDEPENDENT_AMBULATORY_CARE_PROVIDER_SITE_OTHER): Payer: Medicare Other

## 2014-08-07 DIAGNOSIS — G459 Transient cerebral ischemic attack, unspecified: Secondary | ICD-10-CM

## 2014-08-07 DIAGNOSIS — E785 Hyperlipidemia, unspecified: Secondary | ICD-10-CM

## 2014-08-07 DIAGNOSIS — I10 Essential (primary) hypertension: Secondary | ICD-10-CM

## 2014-08-15 ENCOUNTER — Other Ambulatory Visit: Payer: Self-pay | Admitting: Cardiovascular Disease

## 2014-08-16 ENCOUNTER — Telehealth: Payer: Self-pay | Admitting: Neurology

## 2014-08-16 NOTE — Telephone Encounter (Signed)
Patient is calling to get the results of his carotid artery test. Please call.

## 2014-08-27 ENCOUNTER — Telehealth: Payer: Self-pay | Admitting: Cardiovascular Disease

## 2014-08-27 ENCOUNTER — Other Ambulatory Visit: Payer: Self-pay

## 2014-08-27 NOTE — Telephone Encounter (Signed)
New message    Pt c/o medication issue:  1. Name of Medication: losartan  2. How are you currently taking this medication (dosage and times per day)? 100-12.5 mg one time a day   3. Are you having a reaction (difficulty breathing--STAT)? No  4. What is your medication issue? Pt states losartan does not seem to be helping  Pt also state he is taking Lopresor 50mg  2 times a day.  Please advise

## 2014-08-27 NOTE — Telephone Encounter (Signed)
Spoke with patient who states his systolic BP has been 850-277'A daily.  States diastolic pressure is always under 70 mmHg.  He states Dr. Acie Fredrickson advised him in the past that he could take Lopressor 50 mg up to 3 times per day, which he states he has done almost daily recently.  Patient takes Hyzaar 100/12.5 mg daily and Cardura 4 mg daily .  He denies increased sodium intake or recent injury or pain.  He complains of headache when systolic BP is > 128 mmHg (states I always know when my BP is elevated).  I scheduled patient for follow-up with Dr. Acie Fredrickson in [place] on July 21 and advised him that I will call him back tomorrow with his advice.  Patient verbalized understanding and agreement.

## 2014-08-27 NOTE — Telephone Encounter (Signed)
Moderate right ICA and mild bilateral LICA No change compared with prior study

## 2014-08-28 NOTE — Telephone Encounter (Signed)
These BP readings are actually not that bad. I think that he should continue to monitor his BP Will discuss at his appt. In July .

## 2014-08-28 NOTE — Telephone Encounter (Signed)
Attempted to call patient; wife advised me to call patient's cell @ [phone] I spoke with patient and reviewed Dr. Elmarie Shiley advice with him.  Patient verbalized understanding and agreement to monitor BP and pulse 1 hour after taking medication daily and to bring the log to his office visit on 7/21 for Dr. Acie Fredrickson to review.  I advised him to call back before the appointment with questions or concerns.

## 2014-08-28 NOTE — Telephone Encounter (Signed)
Spoke with patient and informed him of Dr Clydene Fake detailed report re: carotid study. Patient verbalized understanding, appreciation.

## 2014-09-20 ENCOUNTER — Ambulatory Visit (INDEPENDENT_AMBULATORY_CARE_PROVIDER_SITE_OTHER): Payer: Medicare Other | Admitting: Cardiovascular Disease

## 2014-09-20 ENCOUNTER — Encounter: Payer: Self-pay | Admitting: Cardiovascular Disease

## 2014-09-20 VITALS — BP 118/50 | HR 65 | Ht 70.0 in | Wt 200.5 lb

## 2014-09-20 DIAGNOSIS — I493 Ventricular premature depolarization: Secondary | ICD-10-CM | POA: Diagnosis not present

## 2014-09-20 DIAGNOSIS — I10 Essential (primary) hypertension: Secondary | ICD-10-CM | POA: Diagnosis not present

## 2014-09-20 NOTE — Patient Instructions (Signed)
Medication Instructions:  none  Labwork: none  Testing/Procedures: none  Follow-Up: Your physician wants you to follow-up in: six months with Dr. Vilinda Boehringer will receive a reminder letter in the mail two months in advance. If you don't receive a letter, please call our office to schedule the follow-up appointment. You will need fasting liver, lipid and BMET labs that day. Nothing to eat or drink after midnight the evening before your labs  Any Other Special Instructions Will Be Listed Below (If Applicable).

## 2014-09-20 NOTE — Progress Notes (Signed)
Cardiology Office Note   Date:  09/20/2014   ID:  KINLEY FERRENTINO, DOB 1940/09/03, MRN [id number]  PCP:  Haywood Pao, MD  Cardiologist:   Acie Fredrickson Wonda Cheng, MD   Chief Complaint  Patient presents with  . Hypertension    6 month f/u no complaints. Meds reveiwed verbally with pt.   Problem List  1. Premature ventricular contractions 2. Anxiety 3. Hypertension 4. Hyperlipidemia 5. 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 Bilateral carotid artery disease - moderate 7. 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 TIA 9. Mild - moderate Aortic stenosis   History of Present Illness: Alexis Griffith is a 74 y.o. male who presents for further evaluation of his palpitations and HTN.  He is having some muscle aches - especially in the morning.  Seems to be bilateral shoulder pain. He has held the pravachol for several weeks but the pain did not resolved. BP has been a little high. Tried taking 1 1/2 of the Losartan / HCTZ and has occasionally takes extra metoprolol.  September 20, 2014:  [name] is doing well.  BP has been well controlled.  Tried a dose of Valsartan and got dizzy, he went back to Losartan after that and has done well.  He needs to have cataract surgery .    Past Medical History  Diagnosis Date  . PVC (premature ventricular contraction)   . Hypertension   . Hyperlipidemia   . Anxiety   . Sinus bradycardia on ECG   . 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 Wilburn Cornelia.  . Mild aortic stenosis     per echo in 2010  . TIA (transient ischemic attack)   . Diabetes mellitus without complication   . Sleep apnea     Past Surgical History  Procedure Laterality Date  . Squamous cell carcinoma excision      tongue     Current Outpatient Prescriptions  Medication Sig Dispense Refill  . buPROPion (WELLBUTRIN SR) 150 MG 12 hr tablet Take 150 mg by mouth daily.      . Choline Fenofibrate (TRILIPIX) 135 MG capsule  Take 135 mg by mouth daily.      . clonazePAM (KLONOPIN) 0.5 MG tablet Take 0.5 mg by mouth 4 (four) times daily.      . Coenzyme Q10 200 MG capsule Take 200 mg by mouth daily.    Marland Kitchen dipyridamole-aspirin (AGGRENOX) 200-25 MG per 12 hr capsule TAKE (1) CAPSULE TWICE DAILY. 60 capsule 8  . doxazosin (CARDURA) 4 MG tablet Take 4 mg by mouth at bedtime.      Marland Kitchen losartan-hydrochlorothiazide (HYZAAR) 100-12.5 MG per tablet TAKE 1 TABLET ONCE DAILY. 30 tablet 3  . metFORMIN (GLUCOPHAGE) 500 MG tablet Take 500 mg by mouth 2 (two) times daily with a meal.      . metoprolol (LOPRESSOR) 50 MG tablet TAKE 1 TABLET TWICE DAILY AS NEEDED FOR BLOOD PRESSURE. 60 tablet 3  . ONE TOUCH ULTRA TEST test strip     . sitaGLIPtin (JANUVIA) 100 MG tablet Take 100 mg by mouth daily.     No current facility-administered medications for this visit.    Allergies:   Lisinopril; Macrodantin; Penicillins; Sulfa drugs cross reactors; and Zithromax    Social History:  The patient  reports that he quit smoking about 39 years ago. He has never used smokeless tobacco. He reports that he does not drink alcohol or use illicit drugs.  Family History:  The patient's family history includes Heart attack in his father and mother; Heart disease in his father and mother.    ROS:  Please see the history of present illness.    Review of Systems: Constitutional:  denies fever, chills, diaphoresis, appetite change and fatigue.  HEENT: denies photophobia, eye pain, redness, hearing loss, ear pain, congestion, sore throat, rhinorrhea, sneezing, neck pain, neck stiffness and tinnitus.  Respiratory: denies SOB, DOE, cough, chest tightness, and wheezing.  Cardiovascular: denies chest pain, palpitations and leg swelling.  Gastrointestinal: denies nausea, vomiting, abdominal pain, diarrhea, constipation, blood in stool.  Genitourinary: denies dysuria, urgency, frequency, hematuria, flank pain and difficulty urinating.  Musculoskeletal:  denies  myalgias, back pain, joint swelling, arthralgias and gait problem.   Skin: denies pallor, rash and wound.  Neurological: denies dizziness, seizures, syncope, weakness, light-headedness, numbness and headaches.   Hematological: denies adenopathy, easy bruising, personal or family bleeding history.  Psychiatric/ Behavioral: denies suicidal ideation, mood changes, confusion, nervousness, sleep disturbance and agitation.       All other systems are reviewed and negative.    PHYSICAL EXAM: VS:  BP 118/50 mmHg  Pulse 65  Ht 5\' 10"  (1.778 m)  Wt 90.946 kg (200 lb 8 oz)  BMI 28.77 kg/m2 , BMI Body mass index is 28.77 kg/(m^2). GEN: Well nourished, well developed, in no acute distress HEENT: normal Neck: no JVD, carotid bruits, or masses Cardiac: RRR; no murmurs, rubs, or gallops,no edema  Respiratory:  clear to auscultation bilaterally, normal work of breathing GI: soft, nontender, nondistended, + BS MS: no deformity or atrophy Skin: warm and dry, no rash Neuro:  Strength and sensation are intact Psych: normal   EKG:  EKG is ordered today. The ekg ordered today demonstrates NSR at 65.  Nonspecific ST abn.    Recent Labs: 12/20/2013: Hemoglobin 13.5; Platelets 209 01/30/2014: ALT 14 07/12/2014: BUN 20; Creatinine, Ser 1.15; Potassium 4.0; Sodium 140    Lipid Panel    Component Value Date/Time   CHOL 148 01/30/2014 0917   CHOL 152 12/30/2011 2107   TRIG 108 01/30/2014 0917   HDL 55 01/30/2014 0917   HDL 35* 12/30/2011 2107   CHOLHDL 2.7 01/30/2014 0917   CHOLHDL 4.3 12/30/2011 2107   VLDL 19 12/30/2011 2107   LDLCALC 71 01/30/2014 0917   LDLCALC 98 12/30/2011 2107      Wt Readings from Last 3 Encounters:  09/20/14 90.946 kg (200 lb 8 oz)  07/16/14 91.853 kg (202 lb 8 oz)  04/05/14 89.585 kg (197 lb 8 oz)      Other studies Reviewed: Additional studies/ records that were reviewed today include: . Review of the above records demonstrates:    ASSESSMENT  AND PLAN:  1. Premature ventricular contractions - basically stable  2. Anxiety 3. Hypertension - BP has been stable , he brought his BP log with him and the recordings are basically in the normal range Continue current doses.  4. Hyperlipidemia 5. 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 Bilateral carotid artery disease - moderate - followed by [facility]  7. 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 TIA 9. Mild - moderate Aortic stenosis   Current medicines are reviewed at length with the patient today.  The patient does not have concerns regarding medicines.  The following changes have been made:  no change   Disposition:   FU with me in 6 months     Signed, Polk Minor, Wonda Cheng, MD  09/20/2014 3:43 PM    [facility] [address], [place], [place]  [address] Phone: [phone]; Fax: [phone]

## 2014-10-10 ENCOUNTER — Ambulatory Visit: Payer: Medicare Other | Admitting: Cardiovascular Disease

## 2014-11-25 ENCOUNTER — Other Ambulatory Visit: Payer: Self-pay | Admitting: Cardiovascular Disease

## 2015-01-16 ENCOUNTER — Ambulatory Visit (INDEPENDENT_AMBULATORY_CARE_PROVIDER_SITE_OTHER): Payer: Medicare Other | Admitting: Nurse Practitioner

## 2015-01-16 ENCOUNTER — Encounter: Payer: Self-pay | Admitting: Nurse Practitioner

## 2015-01-16 VITALS — BP 131/71 | HR 68 | Ht 70.0 in | Wt 203.6 lb

## 2015-01-16 DIAGNOSIS — I10 Essential (primary) hypertension: Secondary | ICD-10-CM | POA: Diagnosis not present

## 2015-01-16 DIAGNOSIS — E785 Hyperlipidemia, unspecified: Secondary | ICD-10-CM

## 2015-01-16 DIAGNOSIS — G459 Transient cerebral ischemic attack, unspecified: Secondary | ICD-10-CM | POA: Diagnosis not present

## 2015-01-16 MED ORDER — ASPIRIN-DIPYRIDAMOLE ER 25-200 MG PO CP12: ORAL_CAPSULE | ORAL | Status: DC

## 2015-01-16 NOTE — Patient Instructions (Addendum)
Continue Aggrenox for secondary stroke prevention Risk factor control with blood pressure less than 130/80 today's reading 131/71 Hemoglobin A1c less than 6.5, last reported was 6 LDL less than 70 Exercises by walking for overall health and well-being Follow-up in 1 year will repeat doppler at that time

## 2015-01-16 NOTE — Progress Notes (Signed)
I have read the note, and I agree with the clinical assessment and plan.  Yuktha Kerchner KEITH   

## 2015-01-16 NOTE — Progress Notes (Signed)
[facility]  PATIENT: Alexis Griffith DOB: 01-May-1940   REASON FOR VISIT follow-up for TIA, essential hypertension, hyperlipidemia HISTORY FROM: Patient    HISTORY OF PRESENT ILLNESS:[name] is a 74 year old Caucasian gentleman who comes in today for followup of TIA. He was a former patient of Dr. Erling Cruz. History from patient old chart.   History of TIA dates back to 08/20/2002 in which he had a sudden onset of gait disorder. 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 There were findings the proximal stenosis in the left fatigue or coming off the subclavian and stenosis of the right ICA. He has history of anxiety and been on benzodiazepines for over 40 years. 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 Wilburn Cornelia.  in late 2013, he developed jaw claudication and visual field loss. He underwent a left temporal artery biopsy by Dr. Wilburn Cornelia 12/31/2011 and was started on Prednisone $RemoveBefor'60mg'uGczbVKzHSEq$  Wed after surgery 12/31/2011 he noted staggering gait and went to the [facility] ER. Imaging was again negative for stroke. 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 He vision improved on Prednisone and sees opthamologist (Dr. Kathrin Penner) every 3 months.  He saw Dr. Charlestine Night who adjusted his Prednisone to $RemoveBefor'40mg'rchFFeDMXYNv$  daily. On Prednisone his blood sugar became uncontrolled and his PCP Dr. Osborne Casco started him on insulin.  UPDATE: 07/27/12  Alexis Griffith states he is doing better, Dr. Osborne Casco recently reduced his prednisone to 15 mg from 20 mg daily. His vision is back to normal and he has a 3 month appt. With opthamologist tomorrow. Carotid dopplers recently done at [facility], unchanged. No neurological deficits. On Aggrenox  for secondary stroke prevention. No side effects reported, no signs of bleeding. Patient reports blood pressure is well controlled, and is 122/65 in office today. Most recent hemoglobin A1c is 5.6. Lipids are well controlled.  Update 06/20/2013 ; He returns for followup after last visit almost a year ago. He continues to do well without recurrence stroke or TIA symptoms now for several years. He has tapered his prednisone and has completely stopped it 2 weeks ago. His ESR is having quite stable for a year and a half. He has no active symptoms of temporal arteritis in the form of jaw claudication, scalp tenderness, headaches or vision loss. He is complaining of muscle aches and arthralgias he was previously on Lipitor in which has been switched to Pravachol 40 mg 2 weeks ago by his primary care physician.   Update 01/16/15: Alexis Griffith returns for follow up for TIA, unaccompanied .Marland Kitchen He states he has not had any stroke or TIA symptoms. Carotid Doppler done 08/07/2014 was stable from previous. He has pain in the shoulders, hips and wrists and decreased ROM but this is better. He recently stopped his pravastatin due to muscle and joint aches. He denies recurrent scalp or jaw pain or vision changes with his history of temporal arteritis.  He gets no regular exercise. He claims his diabetes is in good control. He returns for reevaluation.    REVIEW OF SYSTEMS: Full 14 system review of systems performed and notable only for those listed, all others are neg:  Constitutional: neg  Cardiovascular:  Ear/Nose/Throat: neg  Skin: neg Eyes: neg Respiratory: neg Gastroitestinal:  neg  Hematology/Lymphatic: neg  Endocrine: neg Musculoskeletal: Joint pain back pain Allergy/Immunology: neg Neurological: neg Psychiatric: neg Sleep : Obstructive sleep apnea   ALLERGIES: Allergies  Allergen Reactions  . Lisinopril     Cough   . Macrodantin     Unknown  . Penicillins     Unknown  . Sulfa Drugs Cross Reactors      Unknown    HOME MEDICATIONS: Outpatient Prescriptions Prior to Visit  Medication Sig Dispense Refill  . buPROPion (WELLBUTRIN SR) 150 MG 12 hr tablet Take 150 mg by mouth daily.      . Choline Fenofibrate (TRILIPIX) 135 MG capsule Take 135 mg by mouth daily.      . clonazePAM (KLONOPIN) 0.5 MG tablet Take 0.5 mg by mouth 4 (four) times daily.      . Coenzyme Q10 200 MG capsule Take 200 mg by mouth daily.    Marland Kitchen dipyridamole-aspirin (AGGRENOX) 200-25 MG per 12 hr capsule TAKE (1) CAPSULE TWICE DAILY. 60 capsule 8  . doxazosin (CARDURA) 4 MG tablet Take 4 mg by mouth at bedtime.      Marland Kitchen losartan-hydrochlorothiazide (HYZAAR) 100-12.5 MG per tablet TAKE 1 TABLET ONCE DAILY. 30 tablet 3  . metFORMIN (GLUCOPHAGE) 500 MG tablet Take 500 mg by mouth 2 (two) times daily with a meal.      . metoprolol (LOPRESSOR) 50 MG tablet TAKE  (1)  TABLET TWICE A DAY FOR HIGH BLOOD PRESSURE. 60 tablet 3  . ONE TOUCH ULTRA TEST test strip     . sitaGLIPtin (JANUVIA) 100 MG tablet Take 100 mg by mouth daily.     No facility-administered medications prior to visit.    PAST MEDICAL HISTORY: Past Medical History  Diagnosis Date  . PVC (premature ventricular contraction)   . Hypertension   . Hyperlipidemia   . Anxiety   . Sinus bradycardia on ECG   . Squamous cell carcinoma of tongue [facility]) September 2012    Followed by Dr. Wilburn Cornelia.  . Mild aortic stenosis     per echo in 2010  . TIA (transient ischemic attack)   . Diabetes mellitus without complication ([place])   . Sleep apnea     PAST SURGICAL HISTORY: Past Surgical History  Procedure Laterality Date  . Squamous cell carcinoma excision      tongue    FAMILY HISTORY: Family History  Problem Relation Age of Onset  . Heart disease Mother   . Heart attack Mother   . Heart disease Father   . Heart attack Father     SOCIAL HISTORY: Social History   Social History  . Marital Status: Married    Spouse Name: N/A  . Number of Children: 2  .  Years of Education: N/A   Occupational History  . Not on file.   Social History Main Topics  . Smoking status: Former Smoker    Quit date: 06/25/1975  . Smokeless tobacco: Never Used  . Alcohol Use: No  . Drug Use: No  . Sexual Activity: Yes   Other Topics Concern  . Not on file   Social History Narrative   Patient lives at home with your wife.      PHYSICAL EXAM  Filed Vitals:   01/16/15 1422  BP: 131/71  Pulse: 68  Height: $Remove'5\' 10"'slUxyUv$  (1.778 m)  Weight: 203 lb 9.6 oz (92.352 kg)   Body mass index is 29.21 kg/(m^2). Awake alert. Afebrile. Head is nontraumatic.  Neck is supple without bruit. Hearing is normal.  Cardiac exam no murmur or gallop.  Lungs are clear to auscultation.  Distal pulses are well felt. No temporal artery tenderness bilaterally.  Neurologic Examination:  Mental Status:  Alert, oriented, thought content appropriate. Speech fluent without evidence of aphasia. Able to follow 3 step commands without difficulty.  Cranial Nerves:  II: Visual fields grossly normal, pupils equal, round, reactive to light and accommodation  III,IV, VI: ptosis not present, extra-ocular motions intact bilaterally  V,VII: smile symmetric, facial light touch sensation normal bilaterally  VIII: hearing normal bilaterally  IX,X: gag reflex present  XI: bilateral shoulder shrug symmetric  XII: midline tongue extension  Motor: Upper and lower extremity 5/5 , Tone and bulk: normal tone throughout; no atrophy noted  Sensory: Pinprick and light touch intact throughout, bilaterally  Deep Tendon Reflexes: 2+ and symmetric throughout  Cerebellar: normal finger-to-nose  Gait: slow to rise from seated position, must push off with hands on armrests. Once up, gait is normal but slow. No difficulty with turns tandem is steady. No assistive device    DIAGNOSTIC DATA (LABS, IMAGING, TESTING) - I reviewed patient records, labs, notes, testing and imaging myself where  available.      Component Value Date/Time   NA 140 07/12/2014 1352   NA 137 12/30/2011 1416   K 4.0 07/12/2014 1352   CL 97 07/12/2014 1352   CO2 22 07/12/2014 1352   GLUCOSE 110* 07/12/2014 1352   GLUCOSE 175* 12/30/2011 1416   BUN 20 07/12/2014 1352   BUN 14 12/30/2011 1416   CREATININE 1.15 07/12/2014 1352   CALCIUM 9.7 07/12/2014 1352   PROT 6.5 01/30/2014 0917   PROT 7.0 12/30/2011 1416   ALBUMIN 4.4 01/30/2014 0917   ALBUMIN 3.5 12/30/2011 1416   AST 12 01/30/2014 0917   ALT 14 01/30/2014 0917   ALKPHOS 32* 01/30/2014 0917   BILITOT 0.3 01/30/2014 0917   GFRNONAA 63 07/12/2014 1352   GFRAA 73 07/12/2014 1352    ASSESSMENT AND PLAN  74 y.o. year old male  has a past medical history of Hypertension; Hyperlipidemia; TIA (transient ischemic attack); Diabetes mellitus without complication; and Sleep apnea. here to follow-up. He has not had further stroke or TIA symptoms currently well-controlled on Aggrenox. The patient is a current patient of Dr. [name] who is out of the office today . This note is sent to the work in doctor.    PLANContinue Aggrenox for secondary stroke prevention, will refill Risk factor control with blood pressure less than 130/80 today's reading 131/71 Hemoglobin A1c less than 6.5, last reported was 6 LDL less than 70 last was 112 to be repeated in Jan 2017 Exercises by walking for overall health and well-being along with healthy diet Follow-up in one year repeat Doppler at that time Dennie Bible, [facility], [facility], [facility] [address] [place], [place] [address] [phone]

## 2015-03-07 ENCOUNTER — Other Ambulatory Visit: Payer: Self-pay | Admitting: Cardiovascular Disease

## 2015-03-08 ENCOUNTER — Telehealth: Payer: Self-pay | Admitting: Cardiovascular Disease

## 2015-03-08 MED ORDER — METOPROLOL TARTRATE 50 MG PO TABS: 50.0000 mg | ORAL_TABLET | Freq: Two times a day (BID) | ORAL | Status: DC

## 2015-03-08 NOTE — Telephone Encounter (Signed)
Spoke with patient to ask how frequently he is taking Metoprolol 50 mg.  He states he told Dr. Acie Fredrickson at last office that he takes an extra 1/2 Hyzaar when he notices his BP is high.  He states Dr. Acie Fredrickson told him not to take additional Hyzaar as his is already taking max dose of Losartan.  He states he told him instead to take an additional 1/2 or whole Metoprolol.  I asked how frequently he has been taking it as we received a call from [facility] about instructions on medication because refill was needed prior to end of month.  He states systolic BP has been 0000000 mmHg and diastolic has been around 70 mmHg.  He states heart rate is usually 55-65 bpm.  I advised him that those values are normal and to take the metoprolol sparingly.  He is scheduled for lab work and office visit at the end of January.  He states he has stopped his Pravachol.  I documented on Rx that patient may take an extra pill as needed so that he can get appropriate refills.    I called [employer] and spoke with Nira Conn to make her aware I have spoken with patient and advised him of instructions.  She thanked me for the call.

## 2015-03-08 NOTE — Telephone Encounter (Signed)
Alexis Griffith requesting a refill for the pt's medication Metoprolol 50 mg tablet, stating that pt takes this medication 3 times daily. Medication is prescribed for pt to take it 2 times daily. Please advise

## 2015-03-19 ENCOUNTER — Telehealth: Payer: Self-pay | Admitting: Nurse Practitioner

## 2015-03-19 NOTE — Telephone Encounter (Signed)
Pt called said he dropped off a letter to [employer] from [facility]. Letter sts  dipyridamole-aspirin (AGGRENOX) 200-25 MG 12hr capsule will need PA. He is inquiring if letter was rec'd. Please call pt when PA is approved.

## 2015-03-20 NOTE — Telephone Encounter (Signed)
I was out of the office yesterday.  Message was forwarded to me today.  Ins has been contacted and provided with clinical info.  Request is under review Ref # [id number]  [facility]) has responded stating no prior auth is required, as this drug is on the plan's list of covered medications Ref # [id number].  I called the pharmacy.  Spoke with Abigail Butts, she verified Rx did go through ins without any issues.  Called the patient to advise, got no answer.  Left message.

## 2015-03-25 ENCOUNTER — Other Ambulatory Visit (INDEPENDENT_AMBULATORY_CARE_PROVIDER_SITE_OTHER): Payer: Medicare Other | Admitting: *Deleted

## 2015-03-25 DIAGNOSIS — I10 Essential (primary) hypertension: Secondary | ICD-10-CM

## 2015-03-25 LAB — HEPATIC FUNCTION PANEL
ALT: 15 U/L (ref 9–46)
AST: 15 U/L (ref 10–35)
Albumin: 4 g/dL (ref 3.6–5.1)
Alkaline Phosphatase: 48 U/L (ref 40–115)
Bilirubin, Direct: 0.1 mg/dL (ref <=0.2)
Indirect Bilirubin: 0.3 mg/dL (ref 0.2–1.2)
Total Bilirubin: 0.4 mg/dL (ref 0.2–1.2)
Total Protein: 6.6 g/dL (ref 6.1–8.1)

## 2015-03-25 LAB — BASIC METABOLIC PANEL
BUN: 22 mg/dL (ref 7–25)
CO2: 25 mmol/L (ref 20–31)
Calcium: 9.4 mg/dL (ref 8.6–10.3)
Chloride: 102 mmol/L (ref 98–110)
Creat: 1.14 mg/dL (ref 0.70–1.18)
Glucose, Bld: 100 mg/dL — ABNORMAL HIGH (ref 65–99)
Potassium: 3.8 mmol/L (ref 3.5–5.3)
Sodium: 140 mmol/L (ref 135–146)

## 2015-03-25 LAB — LIPID PANEL
Cholesterol: 167 mg/dL (ref 125–200)
HDL: 33 mg/dL — ABNORMAL LOW (ref >=40)
LDL Cholesterol: 104 mg/dL (ref <130)
Total CHOL/HDL Ratio: 5.1 Ratio — ABNORMAL HIGH (ref <=5.0)
Triglycerides: 148 mg/dL (ref <150)
VLDL: 30 mg/dL (ref <30)

## 2015-03-25 NOTE — Addendum Note (Signed)
Addended by: Eulis Foster on: 03/25/2015 09:20 AM   Modules accepted: Orders

## 2015-03-25 NOTE — Addendum Note (Signed)
Addended by: Eulis Foster on: 03/25/2015 08:19 AM   Modules accepted: Orders

## 2015-04-01 ENCOUNTER — Encounter: Payer: Self-pay | Admitting: Cardiovascular Disease

## 2015-04-01 ENCOUNTER — Ambulatory Visit (INDEPENDENT_AMBULATORY_CARE_PROVIDER_SITE_OTHER): Payer: Medicare Other | Admitting: Cardiovascular Disease

## 2015-04-01 VITALS — BP 140/66 | HR 64 | Ht 70.0 in | Wt 204.8 lb

## 2015-04-01 DIAGNOSIS — I493 Ventricular premature depolarization: Secondary | ICD-10-CM | POA: Diagnosis not present

## 2015-04-01 DIAGNOSIS — I10 Essential (primary) hypertension: Secondary | ICD-10-CM

## 2015-04-01 NOTE — Progress Notes (Signed)
Cardiology Office Note   Date:  04/01/2015   ID:  Tigran, Ricchio 28-Jan-1941, MRN [id number]  PCP:  Haywood Pao, MD  Cardiologist:   Acie Fredrickson Wonda Cheng, MD   Chief Complaint  Patient presents with  . Follow-up    HTN, PVCs   Problem List  1. Premature ventricular contractions 2. Anxiety 3. Hypertension 4. Hyperlipidemia 5. 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 Bilateral carotid artery disease - moderate 7. 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 TIA 9. Mild - moderate Aortic stenosis   History of Present Illness: Alexis Griffith is a 75 y.o. male who presents for further evaluation of his palpitations and HTN.  He is having some muscle aches - especially in the morning.  Seems to be bilateral shoulder pain. He has held the pravachol for several weeks but the pain did not resolved. BP has been a little high. Tried taking 1 1/2 of the Losartan / HCTZ and has occasionally takes extra metoprolol.  September 20, 2014:  [name] is doing well.  BP has been well controlled.  Tried a dose of Valsartan and got dizzy, he went back to Losartan after that and has done well.  He needs to have cataract surgery .   Jan. 30, 2017: Doing well  He did not have his cataract surgery as scheduled.    Recent labs look good Aches and pains but otherwise doing ok    Past Medical History  Diagnosis Date  . PVC (premature ventricular contraction)   . Hypertension   . Hyperlipidemia   . Anxiety   . Sinus bradycardia on ECG   . Squamous cell carcinoma of tongue [facility]) September 2012    Followed by Dr. Wilburn Cornelia.  . Mild aortic stenosis     per echo in 2010  . TIA (transient ischemic attack)   . Diabetes mellitus without complication ([place])   . Sleep apnea     Past Surgical History  Procedure Laterality Date  . Squamous cell carcinoma excision      tongue     Current Outpatient Prescriptions  Medication Sig Dispense Refill  . buPROPion  (WELLBUTRIN SR) 150 MG 12 hr tablet Take 150 mg by mouth daily.      . Choline Fenofibrate (TRILIPIX) 135 MG capsule Take 135 mg by mouth daily.      . clonazePAM (KLONOPIN) 0.5 MG tablet Take 0.5 mg by mouth 4 (four) times daily.      . Coenzyme Q10 200 MG capsule Take 200 mg by mouth daily.    Marland Kitchen dipyridamole-aspirin (AGGRENOX) 200-25 MG 12hr capsule TAKE (1) CAPSULE TWICE DAILY. 60 capsule 11  . doxazosin (CARDURA) 4 MG tablet Take 4 mg by mouth at bedtime.      Marland Kitchen LANTUS SOLOSTAR 100 UNIT/ML Solostar Pen Inject 20 Units as directed daily.    Marland Kitchen LEVEMIR FLEXTOUCH 100 UNIT/ML Pen Inject 20 Units into the skin daily at 10 pm.     . losartan-hydrochlorothiazide (HYZAAR) 100-12.5 MG per tablet TAKE 1 TABLET ONCE DAILY. 30 tablet 3  . metFORMIN (GLUCOPHAGE) 500 MG tablet Take 500 mg by mouth 2 (two) times daily with a meal.      . metoprolol (LOPRESSOR) 50 MG tablet Take 1 tablet (50 mg total) by mouth 2 (two) times daily. For high blood pressure. 180 tablet 3  . ONE TOUCH ULTRA TEST test strip     . sitaGLIPtin (JANUVIA) 100 MG tablet Take 100  mg by mouth daily.     No current facility-administered medications for this visit.    Allergies:   Nitrofurantoin; Sulfa antibiotics; Lisinopril; Macrodantin; Penicillins; and Sulfa drugs cross reactors    Social History:  The patient  reports that he quit smoking about 39 years ago. He has never used smokeless tobacco. He reports that he does not drink alcohol or use illicit drugs.   Family History:  The patient's family history includes Heart attack in his father and mother; Heart disease in his father and mother.    ROS:  Please see the history of present illness.    Review of Systems: Constitutional:  denies fever, chills, diaphoresis, appetite change and fatigue.  HEENT: denies photophobia, eye pain, redness, hearing loss, ear pain, congestion, sore throat, rhinorrhea, sneezing, neck pain, neck stiffness and tinnitus.  Respiratory: denies SOB,  DOE, cough, chest tightness, and wheezing.  Cardiovascular: denies chest pain, palpitations and leg swelling.  Gastrointestinal: denies nausea, vomiting, abdominal pain, diarrhea, constipation, blood in stool.  Genitourinary: denies dysuria, urgency, frequency, hematuria, flank pain and difficulty urinating.  Musculoskeletal: denies  myalgias, back pain, joint swelling, arthralgias and gait problem.   Skin: denies pallor, rash and wound.  Neurological: denies dizziness, seizures, syncope, weakness, light-headedness, numbness and headaches.   Hematological: denies adenopathy, easy bruising, personal or family bleeding history.  Psychiatric/ Behavioral: denies suicidal ideation, mood changes, confusion, nervousness, sleep disturbance and agitation.       All other systems are reviewed and negative.    PHYSICAL EXAM: VS:  BP 140/66 mmHg  Pulse 64  Ht 5\' 10"  (1.778 m)  Wt 204 lb 12.8 oz (92.897 kg)  BMI 29.39 kg/m2 , BMI Body mass index is 29.39 kg/(m^2). GEN: Well nourished, well developed, in no acute distress HEENT: normal Neck: no JVD,  Soft right carotid bruit, or masses Cardiac: RRR; no murmurs, rubs, or gallops,no edema  Respiratory:  clear to auscultation bilaterally, normal work of breathing GI: soft, nontender, nondistended, + BS MS: no deformity or atrophy Skin: warm and dry, no rash Neuro:  Strength and sensation are intact Psych: normal   EKG:  EKG is ordered today. The ekg ordered today demonstrates NSR at 65.  Nonspecific ST abn.    Recent Labs: 03/25/2015: ALT 15; BUN 22; Creat 1.14; Potassium 3.8; Sodium 140    Lipid Panel    Component Value Date/Time   CHOL 167 03/25/2015 0920   CHOL 148 01/30/2014 0917   TRIG 148 03/25/2015 0920   HDL 33* 03/25/2015 0920   HDL 55 01/30/2014 0917   CHOLHDL 5.1* 03/25/2015 0920   CHOLHDL 2.7 01/30/2014 0917   VLDL 30 03/25/2015 0920   LDLCALC 104 03/25/2015 0920   LDLCALC 71 01/30/2014 0917      Wt Readings from  Last 3 Encounters:  04/01/15 204 lb 12.8 oz (92.897 kg)  01/16/15 203 lb 9.6 oz (92.352 kg)  09/20/14 200 lb 8 oz (90.946 kg)      Other studies Reviewed: Additional studies/ records that were reviewed today include: . Review of the above records demonstrates:    ASSESSMENT AND PLAN:  1. Premature ventricular contractions - basically stable  2. Anxiety 3. Hypertension - BP has been stable , he brought his BP log with him and the recordings are basically in the normal range Continue current meds  He tried Valsartan in the past but took only 1 - 160 mg tablet, felt dizzy, and did not take any more.  4. Hyperlipidemia - most  recent lipids look great.  5. 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 Bilateral carotid artery disease - moderate - followed by [facility]  7. 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 TIA 9. Mild - moderate Aortic stenosis Stable , no symptoms    Current medicines are reviewed at length with the patient today.  The patient does not have concerns regarding medicines.  The following changes have been made:  no change   Disposition:   FU with me in 6 months     Signed, Krissa Utke, Wonda Cheng, MD  04/01/2015 2:39 PM    [address], [place], [place]  [address] Phone: [phone]; Fax: [phone]

## 2015-04-01 NOTE — Patient Instructions (Signed)
Medication Instructions:  Your physician recommends that you continue on your current medications as directed. Please refer to the Current Medication list given to you today.   Labwork: Your physician recommends that you return for lab work in: 6 months on the same day as your office visit   Testing/Procedures: None Ordered   Follow-Up: Your physician wants you to follow-up in: 6 months with Dr. Acie Fredrickson.  You will receive a reminder letter in the mail two months in advance. If you don't receive a letter, please call our office to schedule the follow-up appointment.   If you need a refill on your cardiac medications before your next appointment, please call your pharmacy.   Thank you for choosing [facility]! Christen Bame, RN [phone]

## 2015-04-11 NOTE — Telephone Encounter (Addendum)
Received note from [employer] [facility]  PA # [id number]. Medication on the plans list of covered drugs.  His pharmacy should be able to fill.  If questions [facility] [phone].  Pt ID # [id number].  For aggrenox.  [place] fax received.   [phone]

## 2015-10-06 ENCOUNTER — Other Ambulatory Visit: Payer: Self-pay | Admitting: Cardiovascular Disease

## 2015-10-08 ENCOUNTER — Other Ambulatory Visit: Payer: Self-pay | Admitting: Cardiovascular Disease

## 2015-10-10 ENCOUNTER — Other Ambulatory Visit: Payer: Self-pay | Admitting: Cardiovascular Disease

## 2015-10-11 ENCOUNTER — Other Ambulatory Visit: Payer: Self-pay | Admitting: *Deleted

## 2015-10-11 MED ORDER — METOPROLOL TARTRATE 50 MG PO TABS: 50.0000 mg | ORAL_TABLET | Freq: Two times a day (BID) | ORAL | 1 refills | Status: DC

## 2015-10-16 ENCOUNTER — Ambulatory Visit (INDEPENDENT_AMBULATORY_CARE_PROVIDER_SITE_OTHER): Payer: Medicare Other | Admitting: Cardiovascular Disease

## 2015-10-16 ENCOUNTER — Encounter: Payer: Self-pay | Admitting: Cardiovascular Disease

## 2015-10-16 VITALS — BP 120/62 | HR 64 | Ht 70.0 in | Wt 197.6 lb

## 2015-10-16 DIAGNOSIS — I350 Nonrheumatic aortic (valve) stenosis: Secondary | ICD-10-CM | POA: Diagnosis not present

## 2015-10-16 DIAGNOSIS — I10 Essential (primary) hypertension: Secondary | ICD-10-CM | POA: Diagnosis not present

## 2015-10-16 DIAGNOSIS — I493 Ventricular premature depolarization: Secondary | ICD-10-CM

## 2015-10-16 NOTE — Progress Notes (Signed)
Cardiology Office Note   Date:  10/16/2015   ID:  Alexis Griffith, Alexis Griffith 1940-10-22, MRN [id number]  PCP:  Alexis Pao, MD  Cardiologist:   Alexis Moores, MD   No chief complaint on file.  Problem List  1. Premature ventricular contractions 2. Anxiety 3. Hypertension 4. Hyperlipidemia 5. 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 Bilateral carotid artery disease - moderate 7. 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 TIA 9. Mild - moderate Aortic stenosis   History of Present Illness: Alexis Griffith is a 75 y.o. male who presents for further evaluation of his palpitations and HTN.  He is having some muscle aches - especially in the morning.  Seems to be bilateral shoulder pain. He has held the pravachol for several weeks but the pain did not resolved. BP has been a little high. Tried taking 1 1/2 of the Losartan / HCTZ and has occasionally takes extra metoprolol.  September 20, 2014:  Alexis Griffith is doing well.  BP has been well controlled.  Tried a dose of Valsartan and got dizzy, he went back to Losartan after that and has done well.  He needs to have cataract surgery .   Jan. 30, 2017: Doing well  He did not have his cataract surgery as scheduled.    Recent labs look good Aches and pains but otherwise doing ok  Aug. 16, 2017:  Doing ok BP has been well controlled.  Having some left shoulder problems   - hurts at night. 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 Anxiety   . Diabetes mellitus without complication ([place])   . Hyperlipidemia   . Hypertension   . Mild aortic stenosis    per echo in 2010  . PVC (premature ventricular contraction)   . Sinus bradycardia on ECG   . Sleep apnea   . Squamous cell carcinoma of tongue [facility]) September 2012   Followed by Alexis Griffith.  Marland Kitchen TIA (transient ischemic attack)     Past Surgical History:  Procedure Laterality  Date  . SQUAMOUS CELL CARCINOMA EXCISION     tongue     Current Outpatient Prescriptions  Medication Sig Dispense Refill  . buPROPion (WELLBUTRIN SR) 150 MG 12 hr tablet Take 150 mg by mouth daily.      . Choline Fenofibrate (TRILIPIX) 135 MG capsule Take 135 mg by mouth daily.      . clonazePAM (KLONOPIN) 0.5 MG tablet Take 0.5 mg by mouth 4 (four) times daily.      . Coenzyme Q10 200 MG capsule Take 200 mg by mouth daily.    Marland Kitchen dipyridamole-aspirin (AGGRENOX) 200-25 MG 12hr capsule TAKE (1) CAPSULE TWICE DAILY. 60 capsule 11  . doxazosin (CARDURA) 4 MG tablet Take 4 mg by mouth at bedtime.      Marland Kitchen LANTUS SOLOSTAR 100 UNIT/ML Solostar Pen Inject 20 Units as directed daily.    Marland Kitchen LEVEMIR FLEXTOUCH 100 UNIT/ML Pen Inject 20 Units into the skin daily at 10 pm.     . losartan-hydrochlorothiazide (HYZAAR) 100-12.5 MG per tablet TAKE 1 TABLET ONCE DAILY. 30 tablet 3  . metFORMIN (GLUCOPHAGE) 500 MG tablet Take 500 mg by mouth 2 (two) times daily with a meal.      . metoprolol (LOPRESSOR) 50 MG tablet Take 1 tablet (50 mg total) by mouth 2 (two) times daily. For high blood pressure. 180 tablet  1  . ONE TOUCH ULTRA TEST test strip     . sitaGLIPtin (JANUVIA) 100 MG tablet Take 100 mg by mouth daily.     No current facility-administered medications for this visit.     Allergies:   Nitrofurantoin; Sulfa antibiotics; Lisinopril; Macrodantin; Penicillins; and Sulfa drugs cross reactors    Social History:  The patient  reports that he quit smoking about 40 years ago. He has never used smokeless tobacco. He reports that he does not drink alcohol or use drugs.   Family History:  The patient's family history includes Heart attack in his father and mother; Heart disease in his father and mother.    ROS:  Please see the history of present illness.    Review of Systems: Constitutional:  denies fever, chills, diaphoresis, appetite change and fatigue.  HEENT: denies photophobia, eye pain, redness,  hearing loss, ear pain, congestion, sore throat, rhinorrhea, sneezing, neck pain, neck stiffness and tinnitus.  Respiratory: denies SOB, DOE, cough, chest tightness, and wheezing.  Cardiovascular: denies chest pain, palpitations and leg swelling.  Gastrointestinal: denies nausea, vomiting, abdominal pain, diarrhea, constipation, blood in stool.  Genitourinary: denies dysuria, urgency, frequency, hematuria, flank pain and difficulty urinating.  Musculoskeletal: denies  myalgias, back pain, joint swelling, arthralgias and gait problem.   Skin: denies pallor, rash and wound.  Neurological: denies dizziness, seizures, syncope, weakness, light-headedness, numbness and headaches.   Hematological: denies adenopathy, easy bruising, personal or family bleeding history.  Psychiatric/ Behavioral: denies suicidal ideation, mood changes, confusion, nervousness, sleep disturbance and agitation.       All other systems are reviewed and negative.    PHYSICAL EXAM: VS:  BP 120/62 (BP Location: Left Arm, Patient Position: Sitting, Cuff Size: Normal)   Pulse 64   Ht 5\' 10"  (1.778 m)   Wt 197 lb 9.6 oz (89.6 kg)   BMI 28.35 kg/m  , BMI Body mass index is 28.35 kg/m. GEN: Well nourished, well developed, in no acute distress  HEENT: normal  Neck: no JVD,  Soft right carotid bruit, or masses Cardiac: RRR; soft systolic murmur, no rubs, or gallops,no edema  Respiratory:  clear to auscultation bilaterally, normal work of breathing GI: soft, nontender, nondistended, + BS MS: no deformity or atrophy  Skin: warm and dry, no rash Neuro:  Strength and sensation are intact Psych: normal   EKG:  EKG is ordered today. The ekg ordered today demonstrates NSR at 65.  Nonspecific ST abn.    Recent Labs: 03/25/2015: ALT 15; BUN 22; Creat 1.14; Potassium 3.8; Sodium 140    Lipid Panel    Component Value Date/Time   CHOL 167 03/25/2015 0920   CHOL 148 01/30/2014 0917   TRIG 148 03/25/2015 0920   HDL 33 (L)  03/25/2015 0920   HDL 55 01/30/2014 0917   CHOLHDL 5.1 (H) 03/25/2015 0920   VLDL 30 03/25/2015 0920   LDLCALC 104 03/25/2015 0920   LDLCALC 71 01/30/2014 0917      Wt Readings from Last 3 Encounters:  10/16/15 197 lb 9.6 oz (89.6 kg)  04/01/15 204 lb 12.8 oz (92.9 kg)  01/16/15 203 lb 9.6 oz (92.4 kg)      Other studies Reviewed: Additional studies/ records that were reviewed today include: . Review of the above records demonstrates:    ASSESSMENT AND PLAN:  1. Premature ventricular contractions - basically stable  2. Anxiety 3. Hypertension - BP has been stable ,   4. Hyperlipidemia - most recent lipids look great.  5. 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 Bilateral carotid artery disease - moderate - followed by Sethi  7. 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 TIA 9. Mild - moderate Aortic stenosis Stable , no symptoms    Current medicines are reviewed at length with the patient today.  The patient does not have concerns regarding medicines.  The following changes have been made:  no change   Disposition:   FU with me in 6 months     Signed, Alexis Moores, MD  10/16/2015 2:54 PM    [facility] [place], [place], [place]  [address] Phone: [phone]; Fax: [phone]

## 2015-10-16 NOTE — Patient Instructions (Signed)
Medication Instructions:  Your physician recommends that you continue on your current medications as directed. Please refer to the Current Medication list given to you today.   Labwork: None Ordered   Testing/Procedures: None Ordered   Follow-Up: Your physician wants you to follow-up in: 6 months with Dr. Nahser.  You will receive a reminder letter in the mail two months in advance. If you don't receive a letter, please call our office to schedule the follow-up appointment.   If you need a refill on your cardiac medications before your next appointment, please call your pharmacy.   Thank you for choosing [facility]! Sim Choquette, RN [phone]    

## 2016-01-09 ENCOUNTER — Other Ambulatory Visit: Payer: Self-pay | Admitting: Cardiovascular Disease

## 2016-01-16 ENCOUNTER — Ambulatory Visit (INDEPENDENT_AMBULATORY_CARE_PROVIDER_SITE_OTHER): Payer: Medicare Other | Admitting: Nurse Practitioner

## 2016-01-16 ENCOUNTER — Encounter: Payer: Self-pay | Admitting: Nurse Practitioner

## 2016-01-16 VITALS — BP 136/64 | HR 67 | Ht 70.0 in | Wt 200.4 lb

## 2016-01-16 DIAGNOSIS — I739 Peripheral vascular disease, unspecified: Secondary | ICD-10-CM

## 2016-01-16 DIAGNOSIS — I779 Disorder of arteries and arterioles, unspecified: Secondary | ICD-10-CM

## 2016-01-16 DIAGNOSIS — I10 Essential (primary) hypertension: Secondary | ICD-10-CM

## 2016-01-16 DIAGNOSIS — G459 Transient cerebral ischemic attack, unspecified: Secondary | ICD-10-CM

## 2016-01-16 DIAGNOSIS — E785 Hyperlipidemia, unspecified: Secondary | ICD-10-CM

## 2016-01-16 MED ORDER — ASPIRIN-DIPYRIDAMOLE ER 25-200 MG PO CP12: ORAL_CAPSULE | ORAL | 11 refills | Status: DC

## 2016-01-16 NOTE — Progress Notes (Addendum)
[facility]  PATIENT: Alexis Griffith DOB: 04-09-1940   REASON FOR VISIT follow-up for TIA, essential hypertension, hyperlipidemia HISTORY FROM: Patient    HISTORY OF PRESENT ILLNESS:[name] is a 75 year old Caucasian gentleman who comes in today for followup of TIA. He was a former patient of Dr. Erling Cruz. History from patient old chart.   History of TIA dates back to 08/20/2002 in which he had a sudden onset of gait disorder. 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 There were findings the proximal stenosis in the left fatigue or coming off the subclavian and stenosis of the right ICA. He has history of anxiety and been on benzodiazepines for over 40 years. 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 Wilburn Cornelia.  in late 2013, he developed jaw claudication and visual field loss. He underwent a left temporal artery biopsy by Dr. Wilburn Cornelia 12/31/2011 and was started on Prednisone '60mg'$  Wed after surgery 12/31/2011 he noted staggering gait and went to the [facility] ER. Imaging was again negative for stroke. 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 He vision improved on Prednisone and sees opthamologist (Dr. Kathrin Penner) every 3 months.  He saw Dr. Charlestine Night who adjusted his Prednisone to '40mg'$  daily. On Prednisone his blood sugar became uncontrolled and his PCP Dr. Osborne Casco started him on insulin.  UPDATE: 07/27/12  [name] states he is doing better, Dr. Osborne Casco recently reduced his prednisone to 15 mg from 20 mg daily. His vision is back to normal and he has a 3 month appt. With opthamologist tomorrow. Carotid dopplers recently done at [facility], unchanged. No neurological deficits. On Aggrenox  for secondary stroke prevention. No side effects reported, no signs of bleeding. Patient reports blood pressure is well controlled, and is 122/65 in office today. Most recent hemoglobin A1c is 5.6. Lipids are well controlled.  Update 06/20/2013 ; He returns for followup after last visit almost a year ago. He continues to do well without recurrence stroke or TIA symptoms now for several years. He has tapered his prednisone and has completely stopped it 2 weeks ago. His ESR is having quite stable for a year and a half. He has no active symptoms of temporal arteritis in the form of jaw claudication, scalp tenderness, headaches or vision loss. He is complaining of muscle aches and arthralgias he was previously on Lipitor in which has been switched to Pravachol 40 mg 2 weeks ago by his primary care physician.   Update 01/16/15: [name] returns for follow up for TIA, unaccompanied .Marland Kitchen He states he has not had any stroke or TIA symptoms. Carotid Doppler done 08/07/2014 was stable from previous. He has pain in the shoulders, hips and wrists and decreased ROM but this is better. He recently stopped his pravastatin due to muscle and joint aches. He denies recurrent scalp or jaw pain or vision changes with his history of temporal arteritis.  He gets no regular exercise. He claims his diabetes is in good control. He returns for reevaluation.  UPDATE 11/16/2017CM [name], 75 year old male returns for follow-up for TIA unaccompanied. He is currently on Aggrenox without recurrent TIA or stroke symptoms. He also has a history of hyperlipidemia and last LDL 104. He has had muscle  aches to statins in the past and is currently not on medication. He is due to have repeat lipid profile in the next couple of weeks. Blood pressure in fairly good control today at 136/64. He claims his recent hemoglobin A1c at Dr. Loren Racer  office was okay. He does little exercise was encouraged to do so. He has had cataract removal since last  seen. He returns for reevaluation  REVIEW OF SYSTEMS: Full 14 system review of systems performed and notable only for those listed, all others are neg:  Constitutional: neg  Cardiovascular:  Ear/Nose/Throat: neg  Skin: neg Eyes: neg Respiratory: neg Gastroitestinal: neg  Hematology/Lymphatic: neg  Endocrine: neg Musculoskeletal: neg Allergy/Immunology: neg Neurological: neg Psychiatric: neg Sleep :    ALLERGIES: Allergies  Allergen Reactions  . Nitrofurantoin Anaphylaxis  . Sulfa Antibiotics Other (See Comments)    Unknown  . Lisinopril     Cough   . Macrodantin     Unknown  . Penicillins     Unknown  . Sulfa Drugs Cross Reactors     Unknown    HOME MEDICATIONS: Outpatient Medications Prior to Visit  Medication Sig Dispense Refill  . Choline Fenofibrate (TRILIPIX) 135 MG capsule Take 135 mg by mouth daily.      . clonazePAM (KLONOPIN) 0.5 MG tablet Take 0.5 mg by mouth 4 (four) times daily.      . Coenzyme Q10 200 MG capsule Take 200 mg by mouth daily.    Marland Kitchen dipyridamole-aspirin (AGGRENOX) 200-25 MG 12hr capsule TAKE (1) CAPSULE TWICE DAILY. 60 capsule 11  . doxazosin (CARDURA) 4 MG tablet Take 4 mg by mouth at bedtime.      Marland Kitchen LEVEMIR FLEXTOUCH 100 UNIT/ML Pen Inject 20 Units into the skin daily at 10 pm.     . losartan-hydrochlorothiazide (HYZAAR) 100-12.5 MG per tablet TAKE 1 TABLET ONCE DAILY. 30 tablet 3  . metFORMIN (GLUCOPHAGE) 500 MG tablet Take 500 mg by mouth 2 (two) times daily with a meal.      . metoprolol (LOPRESSOR) 50 MG tablet Take 1 tablet by mouth twice a day for blood pressure. May take extra 1/2 to 1 tab by mouth if needed 180 tablet 2  . ONE TOUCH ULTRA TEST test strip     . sitaGLIPtin (JANUVIA) 100 MG tablet Take 100 mg by mouth daily.    Marland Kitchen LANTUS SOLOSTAR 100 UNIT/ML Solostar Pen Inject 20 Units as directed daily.    Marland Kitchen buPROPion (WELLBUTRIN SR) 150 MG 12 hr tablet Take 150 mg by mouth daily.       No facility-administered medications prior  to visit.     PAST MEDICAL HISTORY: Past Medical History:  Diagnosis Date  . Anxiety   . Diabetes mellitus without complication ([place])   . Hyperlipidemia   . Hypertension   . Mild aortic stenosis    per echo in 2010  . PVC (premature ventricular contraction)   . Sinus bradycardia on ECG   . Sleep apnea   . Squamous cell carcinoma of tongue [facility]) September 2012   Followed by Dr. Wilburn Cornelia.  Marland Kitchen TIA (transient ischemic attack)     PAST SURGICAL HISTORY: Past Surgical History:  Procedure Laterality Date  . CATARACT EXTRACTION, BILATERAL  10/2015, and 11/2015   with adding  TORIC lenses bilaterally   . SQUAMOUS CELL CARCINOMA EXCISION     tongue    FAMILY HISTORY: Family History  Problem Relation Age of Onset  . Heart disease Mother   . Heart attack Mother   .  Heart disease Father   . Heart attack Father     SOCIAL HISTORY: Social History   Social History  . Marital status: Married    Spouse name: N/A  . Number of children: 2  . Years of education: N/A   Occupational History  . Not on file.   Social History Main Topics  . Smoking status: Former Smoker    Quit date: 06/25/1975  . Smokeless tobacco: Never Used  . Alcohol use No  . Drug use: No  . Sexual activity: Yes   Other Topics Concern  . Not on file   Social History Narrative   Patient lives at home with your wife.      PHYSICAL EXAM  Vitals:   01/16/16 1341  BP: 136/64  Pulse: 67  Weight: 200 lb 6.4 oz (90.9 kg)  Height: 5\' 10"  (1.778 m)   Body mass index is 28.75 kg/m. Awake alert.  Head is nontraumatic.  Neck is supple without bruit.   Cardiac exam no murmur or gallop.  Lungs are clear to auscultation.  Distal pulses are well felt. No temporal artery tenderness bilaterally.  Neurologic Examination:  Mental Status:  Alert, oriented, thought content appropriate. Speech fluent without evidence of aphasia. Able to follow 3 step commands without difficulty.  Cranial Nerves:  II:  Visual fields grossly normal, pupils equal, round, reactive to light and accommodation  III,IV, VI: ptosis not present, extra-ocular motions intact bilaterally  V,VII: smile symmetric, facial light touch sensation normal bilaterally  VIII: hearing normal bilaterally  IX,X: gag reflex present  XI: bilateral shoulder shrug symmetric  XII: midline tongue extension  Motor: Upper and lower extremity 5/5 , Tone and bulk: normal tone throughout; no atrophy noted  Sensory: Pinprick and light touch intact throughout, bilaterally  Deep Tendon Reflexes: 2+ and symmetric throughout  Cerebellar: normal finger-to-nose  Gait: slow to rise from seated position, must push off with hands on armrests. Once up, gait is normal. No difficulty with turns tandem is steady. No assistive device    DIAGNOSTIC DATA (LABS, IMAGING, TESTING) - I reviewed patient records, labs, notes, testing and imaging myself where available.      Component Value Date/Time   NA 140 03/25/2015 0920   NA 140 07/12/2014 1352   K 3.8 03/25/2015 0920   CL 102 03/25/2015 0920   CO2 25 03/25/2015 0920   GLUCOSE 100 (H) 03/25/2015 0920   BUN 22 03/25/2015 0920   BUN 20 07/12/2014 1352   CREATININE 1.14 03/25/2015 0920   CALCIUM 9.4 03/25/2015 0920   PROT 6.6 03/25/2015 0920   PROT 6.5 01/30/2014 0917   ALBUMIN 4.0 03/25/2015 0920   ALBUMIN 4.4 01/30/2014 0917   AST 15 03/25/2015 0920   ALT 15 03/25/2015 0920   ALKPHOS 48 03/25/2015 0920   BILITOT 0.4 03/25/2015 0920   GFRNONAA 63 07/12/2014 1352   GFRAA 73 07/12/2014 1352    ASSESSMENT AND PLAN  75 y.o. year old male  has a past medical history of Hypertension; Hyperlipidemia; TIA (transient ischemic attack); Diabetes mellitus without complication; and Sleep apnea. here to follow-up. He has not had further stroke or TIA symptoms currently well-controlled on Aggrenox. The patient is a current patient of Dr. 66 who is out of the office today . This note is  sent to the work in doctor.    PLANContinue Aggrenox for secondary stroke prevention, will refill Risk factor control with blood pressure less than 130/80 today's reading 136/64 Hemoglobin A1c ok last month  per patient LDL less than 70 to be rechecked in 1 month Repeat carotid Doppler Exercises by walking for overall health and well-being along with healthy  Follow up in 1 year , next with Sloan Leiter, [facility], [facility], APRN  [facility] [address], [place], [place] [address] [phone]  Personally  participated in and made any corrections needed to history, physical, neuro exam,assessment and plan as stated above.  I have personally obtained the history, evaluated lab date, reviewed imaging studies and agree with radiology interpretations.    Sarina Ill, MD

## 2016-01-16 NOTE — Patient Instructions (Signed)
Continue Aggrenox for secondary stroke prevention, will refill Risk factor control with blood pressure less than 130/80 today's reading 136/64 Hemoglobin A1c ok last month per patient LDL less than 70 to be rechecked in 1 month Exercises by walking for overall health and well-being along with healthy  Follow up in 1 year

## 2016-01-20 ENCOUNTER — Telehealth: Payer: Self-pay | Admitting: Nurse Practitioner

## 2016-01-20 NOTE — Telephone Encounter (Signed)
[facility] Patient ready to be scheduled . NO PA needed. Thanks Hinton Dyer

## 2016-01-27 ENCOUNTER — Other Ambulatory Visit: Payer: Self-pay | Admitting: Cardiovascular Disease

## 2016-04-01 ENCOUNTER — Ambulatory Visit (INDEPENDENT_AMBULATORY_CARE_PROVIDER_SITE_OTHER): Payer: Medicare Other | Admitting: Orthopedic Surgery

## 2016-04-09 ENCOUNTER — Encounter: Payer: Self-pay | Admitting: Cardiovascular Disease

## 2016-04-13 ENCOUNTER — Ambulatory Visit (INDEPENDENT_AMBULATORY_CARE_PROVIDER_SITE_OTHER): Payer: Medicare Other | Admitting: Cardiovascular Disease

## 2016-04-13 ENCOUNTER — Encounter: Payer: Self-pay | Admitting: Cardiovascular Disease

## 2016-04-13 VITALS — BP 140/72 | HR 70 | Ht 70.0 in | Wt 201.0 lb

## 2016-04-13 DIAGNOSIS — I779 Disorder of arteries and arterioles, unspecified: Secondary | ICD-10-CM | POA: Diagnosis not present

## 2016-04-13 DIAGNOSIS — I2510 Atherosclerotic heart disease of native coronary artery without angina pectoris: Secondary | ICD-10-CM | POA: Diagnosis not present

## 2016-04-13 DIAGNOSIS — I493 Ventricular premature depolarization: Secondary | ICD-10-CM

## 2016-04-13 DIAGNOSIS — I10 Essential (primary) hypertension: Secondary | ICD-10-CM

## 2016-04-13 DIAGNOSIS — I739 Peripheral vascular disease, unspecified: Secondary | ICD-10-CM

## 2016-04-13 NOTE — Patient Instructions (Signed)
Medication Instructions:  Your physician recommends that you continue on your current medications as directed. Please refer to the Current Medication list given to you today.   Labwork: None Ordered   Testing/Procedures: None Ordered   Follow-Up: Your physician wants you to follow-up in: 6 months with Dr. Nahser.  You will receive a reminder letter in the mail two months in advance. If you don't receive a letter, please call our office to schedule the follow-up appointment.   If you need a refill on your cardiac medications before your next appointment, please call your pharmacy.   Thank you for choosing [facility]! Persia Lintner, RN [phone]    

## 2016-04-13 NOTE — Progress Notes (Signed)
Cardiology Office Note   Date:  04/13/2016   ID:  Alexis Griffith, Alexis Griffith 07/12/1940, MRN [id number]  PCP:  Alexis Pao, MD  Cardiologist:   Alexis Moores, MD   Chief Complaint  Patient presents with  . Follow-up    PVCs, HTN   Problem List  1. Premature ventricular contractions 2. Anxiety 3. Hypertension 4. Hyperlipidemia 5. 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 Bilateral carotid artery disease - moderate 7. 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 TIA 9. Mild - moderate Aortic stenosis   History of Present Illness: Alexis Griffith is a 76 y.o. male who presents for further evaluation of his palpitations and HTN.  He is having some muscle aches - especially in the morning.  Seems to be bilateral shoulder pain. He has held the pravachol for several weeks but the pain did not resolved. BP has been a little high. Tried taking 1 1/2 of the Losartan / HCTZ and has occasionally takes extra metoprolol.  September 20, 2014:  Alexis Griffith is doing well.  BP has been well controlled.  Tried a dose of Valsartan and got dizzy, he went back to Losartan after that and has done well.  He needs to have cataract surgery .   Jan. 30, 2017: Doing well  He did not have his cataract surgery as scheduled.    Recent labs look good Aches and pains but otherwise doing ok  Aug. 16, 2017:  Doing ok BP has been well controlled.  Having some left shoulder problems   - hurts at night. 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  No CP ,   Still has occasional elevated HTN    Past Medical History:  Diagnosis Date  . Anxiety   . Diabetes mellitus without complication ([place])   . Hyperlipidemia   . Hypertension   . Mild aortic stenosis    per echo in 2010  . PVC (premature ventricular contraction)   . Sinus bradycardia on ECG   . Sleep apnea   . Squamous cell carcinoma of tongue [facility])  September 2012   Followed by Dr. Wilburn Griffith.  Marland Kitchen TIA (transient ischemic attack)     Past Surgical History:  Procedure Laterality Date  . CATARACT EXTRACTION, BILATERAL  10/2015, and 11/2015   with adding  TORIC lenses bilaterally   . SQUAMOUS CELL CARCINOMA EXCISION     tongue     Current Outpatient Prescriptions  Medication Sig Dispense Refill  . Choline Fenofibrate (TRILIPIX) 135 MG capsule Take 135 mg by mouth daily.      . clonazePAM (KLONOPIN) 0.5 MG tablet Take 0.5 mg by mouth 4 (four) times daily.      . Coenzyme Q10 200 MG capsule Take 200 mg by mouth daily.    Marland Kitchen dipyridamole-aspirin (AGGRENOX) 200-25 MG 12hr capsule TAKE (1) CAPSULE TWICE DAILY. 60 capsule 11  . doxazosin (CARDURA) 4 MG tablet Take 4 mg by mouth at bedtime.      Marland Kitchen LANTUS SOLOSTAR 100 UNIT/ML Solostar Pen Inject 20 Units as directed daily.    Marland Kitchen LEVEMIR FLEXTOUCH 100 UNIT/ML Pen Inject 20 Units into the skin daily at 10 pm.     . losartan-hydrochlorothiazide (HYZAAR) 100-12.5 MG tablet Take 1 tablet by mouth daily. 30 tablet 8  . metFORMIN (GLUCOPHAGE) 500 MG tablet Take 500 mg by mouth 2 (two) times daily with a meal.      .  metoprolol (LOPRESSOR) 50 MG tablet Take 1 tablet by mouth twice a day for blood pressure. May take extra 1/2 to 1 tab by mouth if needed 180 tablet 2  . ONE TOUCH ULTRA TEST test strip     . sitaGLIPtin (JANUVIA) 100 MG tablet Take 100 mg by mouth daily.     No current facility-administered medications for this visit.     Allergies:   Nitrofurantoin; Sulfa antibiotics; Lisinopril; Macrodantin; Penicillins; and Sulfa drugs cross reactors    Social History:  The patient  reports that he quit smoking about 40 years ago. He has never used smokeless tobacco. He reports that he does not drink alcohol or use drugs.   Family History:  The patient's family history includes Heart attack in his father and mother; Heart disease in his father and mother.    ROS:  Please see the history of present  illness.    Review of Systems: Constitutional:  denies fever, chills, diaphoresis, appetite change and fatigue.  HEENT: denies photophobia, eye pain, redness, hearing loss, ear pain, congestion, sore throat, rhinorrhea, sneezing, neck pain, neck stiffness and tinnitus.  Respiratory: denies SOB, DOE, cough, chest tightness, and wheezing.  Cardiovascular: denies chest pain, palpitations and leg swelling.  Gastrointestinal: denies nausea, vomiting, abdominal pain, diarrhea, constipation, blood in stool.  Genitourinary: denies dysuria, urgency, frequency, hematuria, flank pain and difficulty urinating.  Musculoskeletal: denies  myalgias, back pain, joint swelling, arthralgias and gait problem.   Skin: denies pallor, rash and wound.  Neurological: denies dizziness, seizures, syncope, weakness, light-headedness, numbness and headaches.   Hematological: denies adenopathy, easy bruising, personal or family bleeding history.  Psychiatric/ Behavioral: denies suicidal ideation, mood changes, confusion, nervousness, sleep disturbance and agitation.       All other systems are reviewed and negative.    PHYSICAL EXAM: VS:  BP 140/72 (BP Location: Left Arm, Patient Position: Sitting, Cuff Size: Normal)   Pulse 70   Ht 5\' 10"  (1.778 m)   Wt 201 lb (91.2 kg)   SpO2 98%   BMI 28.84 kg/m  , BMI Body mass index is 28.84 kg/m. GEN: Well nourished, well developed, in no acute distress  HEENT: normal  Neck: no JVD,  Soft right carotid bruit, or masses Cardiac: RRR; soft systolic murmur, no rubs, or gallops,no edema  Respiratory:  clear to auscultation bilaterally, normal work of breathing GI: soft, nontender, nondistended, + BS MS: no deformity or atrophy  Skin: warm and dry, no rash Neuro:  Strength and sensation are intact Psych: normal   EKG:  EKG is ordered today. The ekg ordered today demonstrates NSR at 70.   Nonspecific ST abn.    Recent Labs: No results found for requested labs  within last 8760 hours.    Lipid Panel    Component Value Date/Time   CHOL 167 03/25/2015 0920   CHOL 148 01/30/2014 0917   TRIG 148 03/25/2015 0920   HDL 33 (L) 03/25/2015 0920   HDL 55 01/30/2014 0917   CHOLHDL 5.1 (H) 03/25/2015 0920   VLDL 30 03/25/2015 0920   LDLCALC 104 03/25/2015 0920   LDLCALC 71 01/30/2014 0917      Wt Readings from Last 3 Encounters:  04/13/16 201 lb (91.2 kg)  01/16/16 200 lb 6.4 oz (90.9 kg)  10/16/15 197 lb 9.6 oz (89.6 kg)      Other studies Reviewed: Additional studies/ records that were reviewed today include: . Review of the above records demonstrates:    ASSESSMENT AND PLAN:  1. Premature ventricular contractions - basically stable  2. Anxiety 3. Hypertension - BP has been stable ,   4. Hyperlipidemia - most recent lipids look great.  5. 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 Bilateral carotid artery disease - moderate - followed by [name] - has an active order for a Duplex  He will call the neurologist and schedule this duplex scan.  7. 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 TIA 9. Mild - moderate Aortic stenosis Stable , no symptoms    Current medicines are reviewed at length with the patient today.  The patient does not have concerns regarding medicines.  The following changes have been made:  no change   Disposition:   FU with me in 6 months     Signed, Alexis Moores, MD  04/13/2016 2:01 PM    [facility] [address], [place], [place]  [address] Phone: [phone]; Fax: [phone]

## 2016-06-04 ENCOUNTER — Other Ambulatory Visit: Payer: Self-pay | Admitting: Cardiovascular Disease

## 2016-08-20 ENCOUNTER — Telehealth: Payer: Self-pay | Admitting: Cardiovascular Disease

## 2016-08-20 NOTE — Telephone Encounter (Signed)
Spoke with patient who states he checks his BP at home and at [facility] and he is concerned about the numbers that he is getting. He reports low diastolic readings of 33-38'V mmHg and high systolic readings of 291-916 mmHg. I reviewed his medications with him and he verified all cardiac medications including an additional 50 mg of lopressor almost daily for palpitations. I asked about his heart rate and he reports it is usually 60 bpm or lower. He states he is having less palpitations since increasing the lopressor. I advised that the additional 1/2 to 1 tablet of lopressor 50 mg was supposed to be used sparingly. I reviewed proper BP technique and timing with him. He has been measuring his BP first thing in the morning. I advised him to monitor BP 30 minutes after morning and evening medications and meal and to keep a log. I advised him to only take the additional lopressor if palpitations worsen and to call back in 1 week to report BP and pulse readings. He verbalized understanding and agreement with plan and thanked me for the call.

## 2016-08-20 NOTE — Telephone Encounter (Signed)
New message     Pt is calling about his bp.  Pt c/o BP issue: STAT if pt c/o blurred vision, one-sided weakness or slurred speech  1. What are your last 5 BP readings? Pt states his top number has been running around 175-180. He did not write them down.   2. Are you having any other symptoms (ex. Dizziness, headache, blurred vision, passed out)? No   3. What is your BP issue? Pt is concerned about his blood pressure.

## 2016-08-21 ENCOUNTER — Encounter: Payer: Self-pay | Admitting: Physician Assistant

## 2016-08-21 NOTE — Telephone Encounter (Signed)
His BP readings are good. I agree with the note from Army Melia, RN

## 2016-09-09 ENCOUNTER — Ambulatory Visit: Payer: Medicare Other | Admitting: Physician Assistant

## 2016-09-15 NOTE — Telephone Encounter (Signed)
Left message for patient that I was calling to see how he is feeling since our last conversation.

## 2016-11-24 ENCOUNTER — Encounter: Payer: Self-pay | Admitting: Cardiovascular Disease

## 2016-11-24 ENCOUNTER — Ambulatory Visit (INDEPENDENT_AMBULATORY_CARE_PROVIDER_SITE_OTHER): Payer: Medicare Other | Admitting: Cardiovascular Disease

## 2016-11-24 VITALS — BP 132/64 | HR 83 | Ht 70.0 in | Wt 212.0 lb

## 2016-11-24 DIAGNOSIS — I10 Essential (primary) hypertension: Secondary | ICD-10-CM | POA: Diagnosis not present

## 2016-11-24 DIAGNOSIS — G459 Transient cerebral ischemic attack, unspecified: Secondary | ICD-10-CM

## 2016-11-24 DIAGNOSIS — I493 Ventricular premature depolarization: Secondary | ICD-10-CM

## 2016-11-24 NOTE — Progress Notes (Signed)
Cardiology Office Note   Date:  11/24/2016   ID:  Alexis Griffith, Alexis Griffith February 15, 1941, MRN [id number]  PCP:  Haywood Pao, MD  Cardiologist:   Mertie Moores, MD   Chief Complaint  Patient presents with  . Follow-up    PVCs, HTN   Problem List  1. Premature ventricular contractions 2. Anxiety 3. Hypertension 4. Hyperlipidemia 5. 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 Bilateral carotid artery disease - moderate 7. 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 TIA 9. Mild - moderate Aortic stenosis   History of Present Illness: Alexis Griffith is a 76 y.o. male who presents for further evaluation of his palpitations and HTN.  He is having some muscle aches - especially in the morning.  Seems to be bilateral shoulder pain. He has held the pravachol for several weeks but the pain did not resolved. BP has been a little high. Tried taking 1 1/2 of the Losartan / HCTZ and has occasionally takes extra metoprolol.  September 20, 2014:  Alexis Griffith is doing well.  BP has been well controlled.  Tried a dose of Valsartan and got dizzy, he went back to Losartan after that and has done well.  He needs to have cataract surgery .   Jan. 30, 2017: Doing well  He did not have his cataract surgery as scheduled.    Recent labs look good Aches and pains but otherwise doing ok  Aug. 16, 2017:  Doing ok BP has been well controlled.  Having some left shoulder problems   - hurts at night. 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  No CP ,   Still has occasional elevated HTN  Sept. 25, 2018: Doing well.  Is followed by [name] for a past CVA and is on Aggrenox ( flagged the ASA dose alert )   Past Medical History:  Diagnosis Date  . Anxiety   . Diabetes mellitus without complication ([place])   . Hyperlipidemia   . Hypertension   . Mild aortic stenosis    per echo in 2010  . PVC (premature  ventricular contraction)   . Sinus bradycardia on ECG   . Sleep apnea   . Squamous cell carcinoma of tongue [facility]) September 2012   Followed by Dr. Wilburn Cornelia.  Marland Kitchen TIA (transient ischemic attack)     Past Surgical History:  Procedure Laterality Date  . CATARACT EXTRACTION, BILATERAL  10/2015, and 11/2015   with adding  TORIC lenses bilaterally   . SQUAMOUS CELL CARCINOMA EXCISION     tongue     Current Outpatient Prescriptions  Medication Sig Dispense Refill  . Choline Fenofibrate (TRILIPIX) 135 MG capsule Take 135 mg by mouth daily.      . clonazePAM (KLONOPIN) 0.5 MG tablet Take 0.5 mg by mouth 4 (four) times daily.      . Coenzyme Q10 200 MG capsule Take 200 mg by mouth daily.    Marland Kitchen dipyridamole-aspirin (AGGRENOX) 200-25 MG 12hr capsule TAKE (1) CAPSULE TWICE DAILY. 60 capsule 11  . doxazosin (CARDURA) 4 MG tablet Take 4 mg by mouth at bedtime.      Marland Kitchen LANTUS SOLOSTAR 100 UNIT/ML Solostar Pen Inject 20 Units as directed daily.    Marland Kitchen LEVEMIR FLEXTOUCH 100 UNIT/ML Pen Inject 40 Units into the skin daily at 10 pm.     . losartan-hydrochlorothiazide (HYZAAR) 100-12.5 MG tablet Take 1 tablet by mouth daily.  30 tablet 8  . metFORMIN (GLUCOPHAGE) 500 MG tablet Take 500 mg by mouth 2 (two) times daily with a meal.      . metoprolol (LOPRESSOR) 50 MG tablet Take 1 tablet by mouth twice a day for blood pressure. May take extra 1/2 to 1 tablet by mouth occasionally if needed. 225 tablet 3  . ONE TOUCH ULTRA TEST test strip     . sitaGLIPtin (JANUVIA) 100 MG tablet Take 100 mg by mouth daily.     No current facility-administered medications for this visit.     Allergies:   Nitrofurantoin; Sulfa antibiotics; Lisinopril; Macrodantin; Penicillins; and Sulfa drugs cross reactors    Social History:  The patient  reports that he quit smoking about 41 years ago. He has never used smokeless tobacco. He reports that he does not drink alcohol or use drugs.   Family History:  The patient's family history  includes Heart attack in his father and mother; Heart disease in his father and mother.    ROS:  Please see the history of present illness.    Review of Systems: Constitutional:  denies fever, chills, diaphoresis, appetite change and fatigue.  HEENT: denies photophobia, eye pain, redness, hearing loss, ear pain, congestion, sore throat, rhinorrhea, sneezing, neck pain, neck stiffness and tinnitus.  Respiratory: denies SOB, DOE, cough, chest tightness, and wheezing.  Cardiovascular: denies chest pain, palpitations and leg swelling.  Gastrointestinal: denies nausea, vomiting, abdominal pain, diarrhea, constipation, blood in stool.  Genitourinary: denies dysuria, urgency, frequency, hematuria, flank pain and difficulty urinating.  Musculoskeletal: denies  myalgias, back pain, joint swelling, arthralgias and gait problem.   Skin: denies pallor, rash and wound.  Neurological: denies dizziness, seizures, syncope, weakness, light-headedness, numbness and headaches.   Hematological: denies adenopathy, easy bruising, personal or family bleeding history.  Psychiatric/ Behavioral: denies suicidal ideation, mood changes, confusion, nervousness, sleep disturbance and agitation.       All other systems are reviewed and negative.    PHYSICAL EXAM: Physical Exam: Blood pressure 132/64, pulse 83, height 5\' 10"  (1.778 m), weight 212 lb (96.2 kg), SpO2 95 %. General: Well developed, well nourished, in no acute distress. Head: Normocephalic, atraumatic, sclera non-icteric, mucus membranes are moist Neck: Supple. Negative for carotid bruits. JVD not elevated. Lungs: Clear bilaterally to auscultation without wheezes, rales, or rhonchi. Breathing is unlabored. Heart: RR, soft systolic murmur . Abdomen: Soft, non-tender, non-distended with normoactive bowel sounds. No hepatomegaly. No rebound/guarding. No obvious abdominal masses. Msk:  Strength and tone appear normal for age. Extremities: No clubbing or  cyanosis. No edema.  Distal pedal pulses are 2+ and equal bilaterally. Neuro: Alert and oriented X 3. Moves all extremities spontaneously. Psych:  Responds to questions appropriately with a normal affect.    EKG:  EKG is ordered today. The ekg ordered today demonstrates NSR at 70.   Nonspecific ST abn.    Recent Labs: No results found for requested labs within last 8760 hours.    Lipid Panel    Component Value Date/Time   CHOL 167 03/25/2015 0920   CHOL 148 01/30/2014 0917   TRIG 148 03/25/2015 0920   HDL 33 (L) 03/25/2015 0920   HDL 55 01/30/2014 0917   CHOLHDL 5.1 (H) 03/25/2015 0920   VLDL 30 03/25/2015 0920   LDLCALC 104 03/25/2015 0920   LDLCALC 71 01/30/2014 0917      Wt Readings from Last 3 Encounters:  11/24/16 212 lb (96.2 kg)  04/13/16 201 lb (91.2 kg)  01/16/16 200 lb  6.4 oz (90.9 kg)      Other studies Reviewed: Additional studies/ records that were reviewed today include: . Review of the above records demonstrates:    ASSESSMENT AND PLAN:  1. Premature ventricular contractions - stable   2. Anxiety 3. Hypertension - BP has been stable ,   4. Hyperlipidemia -   5. 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 Bilateral carotid artery disease -  Followed by [name], Had a carotid duplex scan this year   8. TIA 9. Mild - moderate Aortic stenosis Stable , no symptoms    Current medicines are reviewed at length with the patient today.  The patient does not have concerns regarding medicines.  The following changes have been made:  no change   Disposition:   FU with me in 6 months     Signed, Mertie Moores, MD  11/24/2016 2:47 PM    [facility] [address], [place], [place]  [address] Phone: [phone]; Fax: [phone]

## 2016-11-24 NOTE — Patient Instructions (Signed)
Medication Instructions:  Your physician recommends that you continue on your current medications as directed. Please refer to the Current Medication list given to you today.   Labwork: None Ordered   Testing/Procedures: None Ordered   Follow-Up: Your physician wants you to follow-up in: 6 months with Dr. Nahser.  You will receive a reminder letter in the mail two months in advance. If you don't receive a letter, please call our office to schedule the follow-up appointment.   If you need a refill on your cardiac medications before your next appointment, please call your pharmacy.   Thank you for choosing [facility]! Concepcion Gillott, RN [phone]    

## 2017-01-19 ENCOUNTER — Ambulatory Visit: Payer: Medicare Other | Admitting: Neurology

## 2017-01-26 ENCOUNTER — Other Ambulatory Visit: Payer: Self-pay | Admitting: Nurse Practitioner

## 2017-02-16 ENCOUNTER — Encounter: Payer: Self-pay | Admitting: Neurology

## 2017-02-16 ENCOUNTER — Ambulatory Visit: Payer: Medicare Other | Admitting: Neurology

## 2017-02-16 VITALS — BP 145/76 | HR 85 | Wt 213.4 lb

## 2017-02-16 DIAGNOSIS — G459 Transient cerebral ischemic attack, unspecified: Secondary | ICD-10-CM | POA: Diagnosis not present

## 2017-02-16 NOTE — Progress Notes (Signed)
[facility]  PATIENT: Alexis Griffith DOB: 04-09-1940   REASON FOR VISIT follow-up for TIA, essential hypertension, hyperlipidemia HISTORY FROM: Patient    HISTORY OF PRESENT ILLNESS:Alexis Griffith is a 76 year old Caucasian gentleman who comes in today for followup of TIA. He was a former patient of Dr. Erling Cruz. History from patient old chart.   History of TIA dates back to 08/20/2002 in which he had a sudden onset of gait disorder. 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 There were findings the proximal stenosis in the left fatigue or coming off the subclavian and stenosis of the right ICA. He has history of anxiety and been on benzodiazepines for over 40 years. 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 Wilburn Cornelia.  in late 2013, he developed jaw claudication and visual field loss. He underwent a left temporal artery biopsy by Dr. Wilburn Cornelia 12/31/2011 and was started on Prednisone '60mg'$  Wed after surgery 12/31/2011 he noted staggering gait and went to the [facility] ER. Imaging was again negative for stroke. 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 He vision improved on Prednisone and sees opthamologist (Dr. Kathrin Penner) every 3 months.  He saw Dr. Charlestine Night who adjusted his Prednisone to '40mg'$  daily. On Prednisone his blood sugar became uncontrolled and his PCP Dr. Osborne Casco started him on insulin.  UPDATE: 07/27/12  Alexis Griffith states he is doing better, Dr. Osborne Casco recently reduced his prednisone to 15 mg from 20 mg daily. His vision is back to normal and he has a 3 month appt. With opthamologist tomorrow. Carotid dopplers recently done at [facility], unchanged. No neurological deficits. On Aggrenox  for secondary stroke prevention. No side effects reported, no signs of bleeding. Patient reports blood pressure is well controlled, and is 122/65 in office today. Most recent hemoglobin A1c is 5.6. Lipids are well controlled.  Update 06/20/2013 ; He returns for followup after last visit almost a year ago. He continues to do well without recurrence stroke or TIA symptoms now for several years. He has tapered his prednisone and has completely stopped it 2 weeks ago. His ESR is having quite stable for a year and a half. He has no active symptoms of temporal arteritis in the form of jaw claudication, scalp tenderness, headaches or vision loss. He is complaining of muscle aches and arthralgias he was previously on Lipitor in which has been switched to Pravachol 40 mg 2 weeks ago by his primary care physician.   Update 01/16/15: Alexis Griffith returns for follow up for TIA, unaccompanied .Marland Kitchen He states he has not had any stroke or TIA symptoms. Carotid Doppler done 08/07/2014 was stable from previous. He has pain in the shoulders, hips and wrists and decreased ROM but this is better. He recently stopped his pravastatin due to muscle and joint aches. He denies recurrent scalp or jaw pain or vision changes with his history of temporal arteritis.  He gets no regular exercise. He claims his diabetes is in good control. He returns for reevaluation.  UPDATE 11/16/2017CM Alexis Griffith, 77 year old male returns for follow-up for TIA unaccompanied. He is currently on Aggrenox without recurrent TIA or stroke symptoms. He also has a history of hyperlipidemia and last LDL 104. He has had muscle  aches to statins in the past and is currently not on medication. He is due to have repeat lipid profile in the next couple of weeks. Blood pressure in fairly good control today at 136/64. He claims his recent hemoglobin A1c at Dr. Loren Racer  office was okay. He does little exercise was encouraged to do so. He has had cataract removal since last  seen. He returns for reevaluation Update 02/16/2017 ; he returns for follow-up after last visit a year ago. He continues to do well and has not had recurrent TIA symptoms now for multiple years. He also denies significant headaches to suggest temporal arteritis. He states his blood pressure is quite good and though today it is slightly elevated at 145/76 in office. He remains on Aggrenox which is tolerating well without headache or other side effects. States his sugars are all under good control and fasting usually ranges below 120. Hemoglobin A1c checked a month ago was 6.7. The patient has discontinued Crestor years ago because of muscle aches and pains. He did check his lipid profile 4 months ago by his primary physician and apparently it was satisfactory though I do not have those results. He has no new complaints. The patient has not had carotid ultrasound checked in more than a year but he plans to have this checked at his cardiologist's office at next visit REVIEW OF SYSTEMS: Full 14 system review of systems performed and notable only for those listed, all others are neg:   Frequency of urination, bladder urgency, joint pain and swelling, aching muscles, walking difficulty, apnea, snoring and all other systems negative  ALLERGIES: Allergies  Allergen Reactions  . Nitrofurantoin Anaphylaxis  . Sulfa Antibiotics Other (See Comments)    Unknown  . Lisinopril     Cough   . Macrodantin     Unknown  . Penicillins     Unknown  . Sulfa Drugs Cross Reactors     Unknown    HOME MEDICATIONS: Outpatient Medications Prior to Visit  Medication Sig Dispense Refill  . Choline Fenofibrate (TRILIPIX) 135 MG capsule Take 135 mg by mouth daily.      . clonazePAM (KLONOPIN) 0.5 MG tablet Take 0.5 mg by mouth 4 (four) times daily.      . Coenzyme Q10 200 MG capsule Take 200 mg by mouth daily.    Marland Kitchen dipyridamole-aspirin (AGGRENOX) 200-25 MG 12hr capsule TAKE (1) CAPSULE TWICE DAILY. 60 capsule 0  .  doxazosin (CARDURA) 4 MG tablet Take 4 mg by mouth at bedtime.      Marland Kitchen LANTUS SOLOSTAR 100 UNIT/ML Solostar Pen Inject 50 Units as directed daily.     Marland Kitchen losartan-hydrochlorothiazide (HYZAAR) 100-12.5 MG tablet Take 1 tablet by mouth daily. 30 tablet 8  . metFORMIN (GLUCOPHAGE) 500 MG tablet Take 500 mg by mouth 2 (two) times daily with a meal.      . metoprolol (LOPRESSOR) 50 MG tablet Take 1 tablet by mouth twice a day for blood pressure. May take extra 1/2 to 1 tablet by mouth occasionally if needed. 225 tablet 3  . ONE TOUCH ULTRA TEST test strip     . sitaGLIPtin (JANUVIA) 100 MG tablet Take 100 mg by mouth daily.    . pravastatin (PRAVACHOL) 40 MG tablet Take 40 mg by mouth.    Marland Kitchen LEVEMIR FLEXTOUCH 100 UNIT/ML Pen Inject 40 Units into the skin daily at 10 pm.      No facility-administered medications prior to visit.     PAST MEDICAL HISTORY: Past  Medical History:  Diagnosis Date  . Anxiety   . Diabetes mellitus without complication ([place])   . Hyperlipidemia   . Hypertension   . Mild aortic stenosis    per echo in 2010  . PVC (premature ventricular contraction)   . Sinus bradycardia on ECG   . Sleep apnea   . Squamous cell carcinoma of tongue [facility]) September 2012   Followed by Dr. Wilburn Cornelia.  . Stroke ([place])   . TIA (transient ischemic attack)     PAST SURGICAL HISTORY: Past Surgical History:  Procedure Laterality Date  . CATARACT EXTRACTION, BILATERAL  10/2015, and 11/2015   with adding  TORIC lenses bilaterally   . SQUAMOUS CELL CARCINOMA EXCISION     tongue    FAMILY HISTORY: Family History  Problem Relation Age of Onset  . Heart disease Mother   . Heart attack Mother   . Heart disease Father   . Heart attack Father     SOCIAL HISTORY: Social History   Socioeconomic History  . Marital status: Married    Spouse name: Not on file  . Number of children: 2  . Years of education: Not on file  . Highest education level: Not on file  Social Needs  . Financial  resource strain: Not on file  . Food insecurity - worry: Not on file  . Food insecurity - inability: Not on file  . Transportation needs - medical: Not on file  . Transportation needs - non-medical: Not on file  Occupational History  . Not on file  Tobacco Use  . Smoking status: Former Smoker    Last attempt to quit: 06/25/1975    Years since quitting: 41.6  . Smokeless tobacco: Never Used  Substance and Sexual Activity  . Alcohol use: No  . Drug use: No  . Sexual activity: Yes  Other Topics Concern  . Not on file  Social History Narrative   Patient lives at home with your wife.      PHYSICAL EXAM  Vitals:   02/16/17 1357  BP: (!) 145/76  Pulse: 85  Weight: 213 lb 6.4 oz (96.8 kg)   Body mass index is 30.62 kg/m. Awake alert.  Head is nontraumatic.  Neck is supple without bruit.   Cardiac exam no murmur or gallop.  Lungs are clear to auscultation.  Distal pulses are well felt.   Neurologic Examination:  Mental Status:  Alert, oriented, thought content appropriate. Speech fluent without evidence of aphasia. Able to follow 3 step commands without difficulty.  Cranial Nerves:  II: Visual fields grossly normal, pupils equal, round, reactive to light and accommodation  III,IV, VI: ptosis not present, extra-ocular motions intact bilaterally  V,VII: smile symmetric, facial light touch sensation normal bilaterally  VIII: hearing normal bilaterally  IX,X: gag reflex present  XI: bilateral shoulder shrug symmetric  XII: midline tongue extension  Motor: Upper and lower extremity 5/5 , Tone and bulk: normal tone throughout; no atrophy noted  Sensory: Pinprick and light touch intact throughout, bilaterally  Deep Tendon Reflexes: 2+ and symmetric throughout  Cerebellar: normal finger-to-nose  Gait: slow to rise from seated position, must push off with hands on armrests. Once up, gait is normal. No difficulty with turns tandem is steady. No assistive  device    DIAGNOSTIC DATA (LABS, IMAGING, TESTING) - I reviewed patient records, labs, notes, testing and imaging myself where available.      Component Value Date/Time   NA 140 03/25/2015 0920   NA 140 07/12/2014 1352  K 3.8 03/25/2015 0920   CL 102 03/25/2015 0920   CO2 25 03/25/2015 0920   GLUCOSE 100 (H) 03/25/2015 0920   BUN 22 03/25/2015 0920   BUN 20 07/12/2014 1352   CREATININE 1.14 03/25/2015 0920   CALCIUM 9.4 03/25/2015 0920   PROT 6.6 03/25/2015 0920   PROT 6.5 01/30/2014 0917   ALBUMIN 4.0 03/25/2015 0920   ALBUMIN 4.4 01/30/2014 0917   AST 15 03/25/2015 0920   ALT 15 03/25/2015 0920   ALKPHOS 48 03/25/2015 0920   BILITOT 0.4 03/25/2015 0920   GFRNONAA 63 07/12/2014 1352   GFRAA 73 07/12/2014 1352    ASSESSMENT AND PLAN  76 y.o. year old male  has a past medical history of Hypertension; Hyperlipidemia; TIA (transient ischemic attack); Diabetes mellitus without complication; and Sleep apnea. here to follow-up. He has not had further stroke or TIA symptoms currently well-controlled on Aggrenox.   PLAN: I had a long d/w patient about his remote TIAs stroke, risk for recurrent stroke/TIAs, personally independently reviewed imaging studies and stroke evaluation results and answered questions.Continue Aggrenox for secondary stroke prevention and maintain strict control of hypertension with blood pressure goal below 1:30/90 and diabetes  with hemoglobin A1c goal below 6.5% and lipids with LDL cholesterol goal below 70 mg/dL. I also advised the patient to eat a healthy diet with plenty of whole grains, cereals, fruits and vegetables, exercise regularly and maintain ideal body weight check follow-up carotid ultrasound at next visit. His cardiologist Dr.Nahser .Greater than 50% time during this 25 minute visit was spent on counseling and coordination of care about his remote TIA and joints and arthritis and headaches and answering questions No routine scheduled follow-up  appointment with me is necessary but he may be referred back in the future as needed  Antony Contras, MD [address], [place], [place] [address] [phone]  Personally  participated in and made any corrections needed to history, physical, neuro exam,assessment and plan as stated above.  I have personally obtained the history, evaluated lab date, reviewed imaging studies and agree with radiology interpretations.    Sarina Ill, MD

## 2017-02-16 NOTE — Patient Instructions (Signed)
I had a long d/w patient about his remote TIAs stroke, risk for recurrent stroke/TIAs, personally independently reviewed imaging studies and stroke evaluation results and answered questions.Continue Aggrenox for secondary stroke prevention and maintain strict control of hypertension with blood pressure goal below 1:30/90 and diabetes  with hemoglobin A1c goal below 6.5% and lipids with LDL cholesterol goal below 70 mg/dL. I also advised the patient to eat a healthy diet with plenty of whole grains, cereals, fruits and vegetables, exercise regularly and maintain ideal body weight check follow-up carotid ultrasound at next visit. His cardiologist Dr.Nahser No routine scheduled follow-up appointment with me is necessary but he may be referred back in the future as needed

## 2017-03-01 ENCOUNTER — Other Ambulatory Visit: Payer: Self-pay | Admitting: Neurology

## 2017-03-08 ENCOUNTER — Other Ambulatory Visit: Payer: Self-pay | Admitting: Cardiovascular Disease

## 2017-07-13 ENCOUNTER — Ambulatory Visit: Payer: Medicare Other | Admitting: Cardiovascular Disease

## 2017-07-13 ENCOUNTER — Encounter: Payer: Self-pay | Admitting: Cardiovascular Disease

## 2017-07-13 VITALS — BP 138/58 | HR 65 | Ht 70.0 in | Wt 209.1 lb

## 2017-07-13 DIAGNOSIS — I493 Ventricular premature depolarization: Secondary | ICD-10-CM

## 2017-07-13 DIAGNOSIS — I2510 Atherosclerotic heart disease of native coronary artery without angina pectoris: Secondary | ICD-10-CM | POA: Diagnosis not present

## 2017-07-13 MED ORDER — METOPROLOL TARTRATE 50 MG PO TABS: 50.0000 mg | ORAL_TABLET | Freq: Three times a day (TID) | ORAL | 3 refills | Status: DC

## 2017-07-13 NOTE — Patient Instructions (Signed)
Medication Instructions:  Your physician has recommended you make the following change in your medication:   INCREASE Metoprolol (Lopressor) to 50 mg Three times per day   Labwork: TODAY - cholesterol, liver panel, basic metabolic panel   Testing/Procedures: None Ordered   Follow-Up: Your physician wants you to follow-up in: 1 year with Dr. Acie Fredrickson. You will receive a reminder letter in the mail two months in advance. If you don't receive a letter, please call our office to schedule the follow-up appointment.   If you need a refill on your cardiac medications before your next appointment, please call your pharmacy.   Thank you for choosing [facility]! Christen Bame, RN [phone]

## 2017-07-13 NOTE — Progress Notes (Signed)
Cardiology Office Note   Date:  07/13/2017   ID:  Alexis Griffith, Alexis Griffith 05/05/1940, MRN [id number]  PCP:  Haywood Pao, MD  Cardiologist:   Mertie Moores, MD   No chief complaint on file.  Problem List  1. Premature ventricular contractions 2. Anxiety 3. Hypertension 4. Hyperlipidemia 5. 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 Bilateral carotid artery disease - moderate 7. 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 TIA 9. Mild - moderate Aortic stenosis     Alexis Griffith is a 77 y.o. male who presents for further evaluation of his palpitations and HTN.  He is having some muscle aches - especially in the morning.  Seems to be bilateral shoulder pain. He has held the pravachol for several weeks but the pain did not resolved. BP has been a little high. Tried taking 1 1/2 of the Losartan / HCTZ and has occasionally takes extra metoprolol.  September 20, 2014:  [name] is doing well.  BP has been well controlled.  Tried a dose of Valsartan and got dizzy, he went back to Losartan after that and has done well.  He needs to have cataract surgery .   Jan. 30, 2017: Doing well  He did not have his cataract surgery as scheduled.    Recent labs look good Aches and pains but otherwise doing ok  Aug. 16, 2017:  Doing ok BP has been well controlled.  Having some left shoulder problems   - hurts at night. 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  No CP ,   Still has occasional elevated HTN  Sept. 25, 2018: Doing well.  Is followed by [name] for a past CVA and is on Aggrenox ( flagged the ASA dose alert )   Jul 13, 2017:   Alexis Griffith is doing well. Is not exercising  Is having some balance issues.   No CP  Still stress in his life.   PVCs seem to be well controlled.  Is taking metoprolol 50 mg TID   Past Medical History:  Diagnosis Date  . Anxiety   . Diabetes mellitus  without complication ([place])   . Hyperlipidemia   . Hypertension   . Mild aortic stenosis    per echo in 2010  . PVC (premature ventricular contraction)   . Sinus bradycardia on ECG   . Sleep apnea   . Squamous cell carcinoma of tongue [facility]) September 2012   Followed by Dr. Wilburn Cornelia.  . Stroke ([place])   . TIA (transient ischemic attack)     Past Surgical History:  Procedure Laterality Date  . CATARACT EXTRACTION, BILATERAL  10/2015, and 11/2015   with adding  TORIC lenses bilaterally   . SQUAMOUS CELL CARCINOMA EXCISION     tongue     Current Outpatient Medications  Medication Sig Dispense Refill  . Choline Fenofibrate (TRILIPIX) 135 MG capsule Take 135 mg by mouth daily.      . clonazePAM (KLONOPIN) 0.5 MG tablet Take 0.5 mg by mouth 4 (four) times daily.      . Coenzyme Q10 200 MG capsule Take 200 mg by mouth daily.    Marland Kitchen dipyridamole-aspirin (AGGRENOX) 200-25 MG 12hr capsule Take 1 capsule by mouth 2 (two) times daily.    Marland Kitchen doxazosin (CARDURA) 4 MG tablet Take 4 mg by mouth at bedtime.      . fluticasone (FLONASE) 50 MCG/ACT nasal spray  Place 1 spray into the nose daily as needed. For decongestant    . LANTUS SOLOSTAR 100 UNIT/ML Solostar Pen Inject 50 Units as directed daily.     Marland Kitchen losartan-hydrochlorothiazide (HYZAAR) 100-12.5 MG tablet Take 1 tablet by mouth daily. 30 tablet 8  . metFORMIN (GLUCOPHAGE) 500 MG tablet Take 500 mg by mouth 2 (two) times daily with a meal.      . metoprolol tartrate (LOPRESSOR) 50 MG tablet TAKE 1 TABLET TWICE A DAY FOR BLOOD PRESSURE. MAY TAKE EXTRA 1/2 TO 1TAB OCCASIONALLY IF NEEDED. 270 tablet 2  . ONE TOUCH ULTRA TEST test strip     . sitaGLIPtin (JANUVIA) 100 MG tablet Take 100 mg by mouth daily.     No current facility-administered medications for this visit.     Allergies:   Nitrofurantoin; Sulfa antibiotics; Lisinopril; Macrodantin; Penicillins; and Sulfa drugs cross reactors    Social History:  The patient  reports that he quit  smoking about 42 years ago. He has never used smokeless tobacco. He reports that he does not drink alcohol or use drugs.   Family History:  The patient's family history includes Heart attack in his father and mother; Heart disease in his father and mother.    ROS:   Noted in current history, otherwise review of systems is negative.  Physical Exam: Blood pressure (!) 138/58, pulse 65, height 5\' 10"  (1.778 m), weight 209 lb 1.9 oz (94.9 kg), SpO2 97 %.  GEN:  Well nourished, well developed in no acute distress HEENT: Normal NECK: No JVD; No carotid bruits LYMPHATICS: No lymphadenopathy CARDIAC: RRR   RESPIRATORY:  Clear to auscultation without rales, wheezing or rhonchi  ABDOMEN: Soft, non-tender, non-distended MUSCULOSKELETAL:  No edema; No deformity  SKIN: Warm and dry NEUROLOGIC:  Alert and oriented x 3     EKG:   Jul 13, 2017: Normal sinus rhythm at 65 beats a minute.  Nonspecific T wave abnormality in the inferior and lateral leads.  Recent Labs: No results found for requested labs within last 8760 hours.    Lipid Panel    Component Value Date/Time   CHOL 167 03/25/2015 0920   CHOL 148 01/30/2014 0917   TRIG 148 03/25/2015 0920   HDL 33 (L) 03/25/2015 0920   HDL 55 01/30/2014 0917   CHOLHDL 5.1 (H) 03/25/2015 0920   VLDL 30 03/25/2015 0920   LDLCALC 104 03/25/2015 0920   LDLCALC 71 01/30/2014 0917      Wt Readings from Last 3 Encounters:  07/13/17 209 lb 1.9 oz (94.9 kg)  02/16/17 213 lb 6.4 oz (96.8 kg)  11/24/16 212 lb (96.2 kg)      Other studies Reviewed: Additional studies/ records that were reviewed today include: . Review of the above records demonstrates:    ASSESSMENT AND PLAN:  1. Premature ventricular contractions -    Well controlled.  Continue metoprol9ol 50 TID ( which is what he is actually taking now)  Continue meds.   2. Anxiety 3. Hypertension - ,   BP is stable   4. Hyperlipidemia -   Lipids were last checked in 2017.   Will  check today   5. 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 Bilateral carotid artery disease -  Managed by neurology     8. TIA 9. Mild - moderate Aortic stenosis - stable      Current medicines are reviewed at length with the patient today.  The patient does not  have concerns regarding medicines.  The following changes have been made:  no change   Disposition:   FU with me in 1 year    Signed, Mertie Moores, MD  07/13/2017 4:28 PM    [address], [place], [place]  [address] Phone: [phone]; Fax: [phone]

## 2017-07-14 LAB — LIPID PANEL
Chol/HDL Ratio: 6.3 ratio — ABNORMAL HIGH (ref 0.0–5.0)
Cholesterol, Total: 203 mg/dL — ABNORMAL HIGH (ref 100–199)
HDL: 32 mg/dL — ABNORMAL LOW (ref >39)
LDL Calculated: 123 mg/dL — ABNORMAL HIGH (ref 0–99)
Triglycerides: 239 mg/dL — ABNORMAL HIGH (ref 0–149)
VLDL Cholesterol Cal: 48 mg/dL — ABNORMAL HIGH (ref 5–40)

## 2017-07-14 LAB — BASIC METABOLIC PANEL
BUN/Creatinine Ratio: 22 (ref 10–24)
BUN: 23 mg/dL (ref 8–27)
CO2: 25 mmol/L (ref 20–29)
Calcium: 10.4 mg/dL — ABNORMAL HIGH (ref 8.6–10.2)
Chloride: 97 mmol/L (ref 96–106)
Creatinine, Ser: 1.03 mg/dL (ref 0.76–1.27)
GFR calc Af Amer: 81 mL/min/{1.73_m2} (ref >59)
GFR calc non Af Amer: 70 mL/min/{1.73_m2} (ref >59)
Glucose: 132 mg/dL — ABNORMAL HIGH (ref 65–99)
Potassium: 4.1 mmol/L (ref 3.5–5.2)
Sodium: 137 mmol/L (ref 134–144)

## 2017-07-14 LAB — HEPATIC FUNCTION PANEL
ALT: 12 IU/L (ref 0–44)
AST: 14 IU/L (ref 0–40)
Albumin: 4.6 g/dL (ref 3.5–4.8)
Alkaline Phosphatase: 43 IU/L (ref 39–117)
Bilirubin Total: 0.3 mg/dL (ref 0.0–1.2)
Bilirubin, Direct: 0.1 mg/dL (ref 0.00–0.40)
Total Protein: 6.6 g/dL (ref 6.0–8.5)

## 2017-07-15 ENCOUNTER — Telehealth: Payer: Self-pay | Admitting: Cardiovascular Disease

## 2017-07-15 DIAGNOSIS — E782 Mixed hyperlipidemia: Secondary | ICD-10-CM

## 2017-07-15 DIAGNOSIS — E781 Pure hyperglyceridemia: Secondary | ICD-10-CM

## 2017-07-15 MED ORDER — ROSUVASTATIN CALCIUM 10 MG PO TABS: 10.0000 mg | ORAL_TABLET | Freq: Every day | ORAL | 3 refills | Status: DC

## 2017-07-15 NOTE — Telephone Encounter (Signed)
Reviewed lab results with patient. He states he has tried different statin medications but cannot specifically remember adverse side effects from any particular one. He states he is willing to restart Crestor. I reviewed with Dr. Acie Fredrickson, who is in the office, and he advised patient may start Crestor 10 mg daily. I advised patient that we will recheck labs in 3 months and to call back with any questions or concerns prior to that time. He thanked me for the call.

## 2017-07-15 NOTE — Telephone Encounter (Signed)
-----   Message from Thayer Headings, MD sent at 07/14/2017  4:35 PM EDT ----- BMP uis stable.   Glucose remains mildly elevated.   His chol . LDL and trig level are all elevated.   Need to work on a better diet and exercise progra  Has he taken a stattin in the past. Would suggest atorvastatin 40 mg at night. Check labs in 3 months

## 2017-07-15 NOTE — Telephone Encounter (Signed)
Pt calling to return your call. °

## 2017-08-05 ENCOUNTER — Encounter: Payer: Self-pay | Admitting: Cardiovascular Disease

## 2017-08-29 ENCOUNTER — Other Ambulatory Visit: Payer: Self-pay | Admitting: Neurology

## 2017-08-30 ENCOUNTER — Other Ambulatory Visit: Payer: Self-pay

## 2017-08-30 MED ORDER — ASPIRIN-DIPYRIDAMOLE ER 25-200 MG PO CP12: 1.0000 | ORAL_CAPSULE | Freq: Two times a day (BID) | ORAL | 0 refills | Status: DC

## 2017-09-27 ENCOUNTER — Other Ambulatory Visit: Payer: Self-pay | Admitting: Neurology

## 2017-10-20 ENCOUNTER — Other Ambulatory Visit: Payer: Medicare Other | Admitting: *Deleted

## 2017-10-20 DIAGNOSIS — E782 Mixed hyperlipidemia: Secondary | ICD-10-CM

## 2017-10-20 DIAGNOSIS — E781 Pure hyperglyceridemia: Secondary | ICD-10-CM

## 2017-10-20 LAB — LIPID PANEL
Chol/HDL Ratio: 5.7 ratio — ABNORMAL HIGH (ref 0.0–5.0)
Cholesterol, Total: 182 mg/dL (ref 100–199)
HDL: 32 mg/dL — ABNORMAL LOW (ref >39)
LDL Calculated: 121 mg/dL — ABNORMAL HIGH (ref 0–99)
Triglycerides: 146 mg/dL (ref 0–149)
VLDL Cholesterol Cal: 29 mg/dL (ref 5–40)

## 2017-10-20 LAB — HEPATIC FUNCTION PANEL
ALT: 18 IU/L (ref 0–44)
AST: 19 IU/L (ref 0–40)
Albumin: 4.2 g/dL (ref 3.5–4.8)
Alkaline Phosphatase: 34 IU/L — ABNORMAL LOW (ref 39–117)
Bilirubin Total: 0.4 mg/dL (ref 0.0–1.2)
Bilirubin, Direct: 0.13 mg/dL (ref 0.00–0.40)
Total Protein: 6.3 g/dL (ref 6.0–8.5)

## 2017-10-20 LAB — BASIC METABOLIC PANEL
BUN/Creatinine Ratio: 16 (ref 10–24)
BUN: 18 mg/dL (ref 8–27)
CO2: 22 mmol/L (ref 20–29)
Calcium: 9.5 mg/dL (ref 8.6–10.2)
Chloride: 100 mmol/L (ref 96–106)
Creatinine, Ser: 1.12 mg/dL (ref 0.76–1.27)
GFR calc Af Amer: 73 mL/min/{1.73_m2} (ref >59)
GFR calc non Af Amer: 63 mL/min/{1.73_m2} (ref >59)
Glucose: 100 mg/dL — ABNORMAL HIGH (ref 65–99)
Potassium: 3.9 mmol/L (ref 3.5–5.2)
Sodium: 139 mmol/L (ref 134–144)

## 2017-10-21 ENCOUNTER — Telehealth: Payer: Self-pay | Admitting: Nurse Practitioner

## 2017-10-21 MED ORDER — ROSUVASTATIN CALCIUM 5 MG PO TABS: 5.0000 mg | ORAL_TABLET | Freq: Every day | ORAL | 3 refills | Status: DC

## 2017-10-21 NOTE — Telephone Encounter (Signed)
Called patient to review lab results. He admits that he has not been taking Rosuvastatin 10 mg QD due to muscle aches. I asked if he had tried reducing the dose and he denies. He states pills are too small to break. I advised that he may either try Rosuvastatin 10 mg every other day or 5 mg daily. Patient states he would like to try 5 mg daily. A new Rx is being sent to [facility] per patient request. I discussed the importance of exercise, and a low carbohydrate, low fat, low cholesterol diet with patient and he verbalized understanding and agreement. He requests a return visit with Dr. Acie Fredrickson with fasting lab work in February and I advised that he should receive a call towards the end of this year to get that scheduled. He thanked me for the call.

## 2017-10-21 NOTE — Telephone Encounter (Signed)
-----   Message from Thayer Headings, MD sent at 10/20/2017  6:54 PM EDT ----- His LDL is 121.  It was 71 several years ago.  Is he still taking the rosuvastatin.  His triglyceride levels have improved.  Have him restart rosuvastatin he is not taking it.  Otherwise continue his same medications and recheck in 6 months.

## 2017-12-27 ENCOUNTER — Telehealth: Payer: Self-pay | Admitting: Cardiovascular Disease

## 2017-12-27 DIAGNOSIS — E781 Pure hyperglyceridemia: Secondary | ICD-10-CM

## 2017-12-27 DIAGNOSIS — E782 Mixed hyperlipidemia: Secondary | ICD-10-CM

## 2017-12-27 NOTE — Telephone Encounter (Signed)
Called patient and advised that February appointment with Dr. Acie Fredrickson is needed with fasting labs before. Patient is scheduled for lab appointment on 2/6 and appointment with Dr. Acie Fredrickson on 2/11. He thanked me for the call.

## 2017-12-27 NOTE — Telephone Encounter (Signed)
New Message:    When does pt need his next appt December or February?

## 2018-04-07 ENCOUNTER — Other Ambulatory Visit: Payer: Medicare Other

## 2018-04-08 ENCOUNTER — Telehealth: Payer: Self-pay | Admitting: Cardiovascular Disease

## 2018-04-08 NOTE — Telephone Encounter (Signed)
° ° °  Patient plans to have lab draw on 2/10, lab orders are expired please enter new order.

## 2018-04-08 NOTE — Telephone Encounter (Signed)
Placed call to pt.  He missed his lab appt yesterday, 04/07/2018 and wanted to know if he could still come and get his labs before he sees Dr. Acie Fredrickson. Pt advised that his lab orders are still good and he could get his labs on 04/11/2018 Pt thanked me for the call.

## 2018-04-11 ENCOUNTER — Other Ambulatory Visit: Payer: Medicare Other | Admitting: *Deleted

## 2018-04-11 DIAGNOSIS — E782 Mixed hyperlipidemia: Secondary | ICD-10-CM

## 2018-04-11 DIAGNOSIS — E781 Pure hyperglyceridemia: Secondary | ICD-10-CM

## 2018-04-11 LAB — BASIC METABOLIC PANEL
BUN/Creatinine Ratio: 18 (ref 10–24)
BUN: 24 mg/dL (ref 8–27)
CO2: 24 mmol/L (ref 20–29)
Calcium: 9.8 mg/dL (ref 8.6–10.2)
Chloride: 99 mmol/L (ref 96–106)
Creatinine, Ser: 1.32 mg/dL — ABNORMAL HIGH (ref 0.76–1.27)
GFR calc Af Amer: 60 mL/min/{1.73_m2} (ref >59)
GFR calc non Af Amer: 52 mL/min/{1.73_m2} — ABNORMAL LOW (ref >59)
Glucose: 73 mg/dL (ref 65–99)
Potassium: 3.9 mmol/L (ref 3.5–5.2)
Sodium: 139 mmol/L (ref 134–144)

## 2018-04-11 LAB — HEPATIC FUNCTION PANEL
ALT: 15 IU/L (ref 0–44)
AST: 15 IU/L (ref 0–40)
Albumin: 4.3 g/dL (ref 3.7–4.7)
Alkaline Phosphatase: 34 IU/L — ABNORMAL LOW (ref 39–117)
Bilirubin Total: 0.3 mg/dL (ref 0.0–1.2)
Bilirubin, Direct: 0.12 mg/dL (ref 0.00–0.40)
Total Protein: 6.6 g/dL (ref 6.0–8.5)

## 2018-04-11 LAB — LIPID PANEL
Chol/HDL Ratio: 4 ratio (ref 0.0–5.0)
Cholesterol, Total: 136 mg/dL (ref 100–199)
HDL: 34 mg/dL — ABNORMAL LOW
LDL Calculated: 73 mg/dL (ref 0–99)
Triglycerides: 147 mg/dL (ref 0–149)
VLDL Cholesterol Cal: 29 mg/dL (ref 5–40)

## 2018-04-12 ENCOUNTER — Ambulatory Visit (INDEPENDENT_AMBULATORY_CARE_PROVIDER_SITE_OTHER): Payer: Medicare Other | Admitting: Cardiovascular Disease

## 2018-04-12 ENCOUNTER — Encounter: Payer: Self-pay | Admitting: Cardiovascular Disease

## 2018-04-12 VITALS — BP 138/46 | HR 65 | Ht 70.0 in | Wt 205.4 lb

## 2018-04-12 DIAGNOSIS — I10 Essential (primary) hypertension: Secondary | ICD-10-CM

## 2018-04-12 DIAGNOSIS — I6523 Occlusion and stenosis of bilateral carotid arteries: Secondary | ICD-10-CM

## 2018-04-12 DIAGNOSIS — I350 Nonrheumatic aortic (valve) stenosis: Secondary | ICD-10-CM | POA: Diagnosis not present

## 2018-04-12 NOTE — Patient Instructions (Addendum)
Medication Instructions:  Your physician recommends that you continue on your current medications as directed. Please refer to the Current Medication list given to you today.  If you need a refill on your cardiac medications before your next appointment, please call your pharmacy.   Lab work: None Ordered    Testing/Procedures: Your physician has requested that you have an echocardiogram. Echocardiography is a painless test that uses sound waves to create images of your heart. It provides your doctor with information about the size and shape of your heart and how well your heart's chambers and valves are working. This procedure takes approximately one hour. There are no restrictions for this procedure.  Your physician has requested that you have a carotid duplex. This test is an ultrasound of the carotid arteries in your neck. It looks at blood flow through these arteries that supply the brain with blood. Allow one hour for this exam. There are no restrictions or special instructions.    Follow-Up: At [facility], you and your health needs are our priority.  As part of our continuing mission to provide you with exceptional heart care, we have created designated Provider Care Teams.  These Care Teams include your primary Cardiologist (physician) and Advanced Practice Providers (APPs -  Physician Assistants and Nurse Practitioners) who all work together to provide you with the care you need, when you need it. You will need a follow up appointment in:  1 years.  Please call our office 2 months in advance to schedule this appointment.  You may see Mertie Moores, MD or one of the following Advanced Practice Providers on your designated Care Team: Richardson Dopp, PA-C [facility], [place] . Daune Perch, NP

## 2018-04-12 NOTE — Progress Notes (Signed)
Cardiology Office Note   Date:  04/12/2018   ID:  Alexis Griffith, Alexis Griffith 08-03-1940, MRN [id number]  PCP:  Alexis Pao, MD  Cardiologist:   Alexis Moores, MD   Chief Complaint  Patient presents with  . Hypertension  . Palpitations   Problem List  1. Premature ventricular contractions 2. Anxiety 3. Hypertension 4. Hyperlipidemia 5. 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 Bilateral carotid artery disease - moderate 7. 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 TIA 9. Mild - moderate Aortic stenosis     Alexis Griffith is a 78 y.o. male who presents for further evaluation of his palpitations and HTN.  He is having some muscle aches - especially in the morning.  Seems to be bilateral shoulder pain. He has held the pravachol for several weeks but the pain did not resolved. BP has been a little high. Tried taking 1 1/2 of the Losartan / HCTZ and has occasionally takes extra metoprolol.  September 20, 2014:  Alexis Griffith is doing well.  BP has been well controlled.  Tried a dose of Valsartan and got dizzy, he went back to Losartan after that and has done well.  He needs to have cataract surgery .   Jan. 30, 2017: Doing well  He did not have his cataract surgery as scheduled.    Recent labs look good Aches and pains but otherwise doing ok  Aug. 16, 2017:  Doing ok BP has been well controlled.  Having some left shoulder problems   - hurts at night. 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  No CP ,   Still has occasional elevated HTN  Sept. 25, 2018: Doing well.  Is followed by Leonie Griffith for a past CVA and is on Aggrenox ( flagged the ASA dose alert )   Jul 13, 2017:   Alexis Griffith is doing well. Is not exercising  Is having some balance issues.   No CP  Still stress in his life.   PVCs seem to be well controlled.  Is taking metoprolol 50 mg TID   April 12, 2018:  No CP ,  no dyspnea.  Palpitations are under control   Past Medical History:  Diagnosis Date  . Anxiety   . Diabetes mellitus without complication ([place])   . Hyperlipidemia   . Hypertension   . Mild aortic stenosis    per echo in 2010  . PVC (premature ventricular contraction)   . Sinus bradycardia on ECG   . Sleep apnea   . Squamous cell carcinoma of tongue [facility]) September 2012   Followed by Dr. Wilburn Cornelia.  . Stroke ([place])   . TIA (transient ischemic attack)     Past Surgical History:  Procedure Laterality Date  . CATARACT EXTRACTION, BILATERAL  10/2015, and 11/2015   with adding  TORIC lenses bilaterally   . SQUAMOUS CELL CARCINOMA EXCISION     tongue     Current Outpatient Medications  Medication Sig Dispense Refill  . Choline Fenofibrate (TRILIPIX) 135 MG capsule Take 135 mg by mouth daily.      . clonazePAM (KLONOPIN) 0.5 MG tablet Take 0.5 mg by mouth 4 (four) times daily.      . Coenzyme Q10 200 MG capsule Take 200 mg by mouth daily.    Marland Kitchen dipyridamole-aspirin (AGGRENOX) 200-25 MG 12hr capsule Take 1 capsule by mouth 2 (two) times daily. 60 capsule 0  .  doxazosin (CARDURA) 4 MG tablet Take 4 mg by mouth at bedtime.      Marland Kitchen GLYXAMBI 25-5 MG TABS Take 1 tablet by mouth daily.    . Lancets (ONETOUCH DELICA PLUS [id number]) [place]     . LANTUS SOLOSTAR 100 UNIT/ML Solostar Pen Inject 60 Units as directed daily.     Marland Kitchen losartan-hydrochlorothiazide (HYZAAR) 100-12.5 MG tablet Take 1 tablet by mouth daily. 30 tablet 8  . metoprolol tartrate (LOPRESSOR) 50 MG tablet Take 1 tablet (50 mg total) by mouth 3 (three) times daily. 270 tablet 3  . ONE TOUCH ULTRA TEST test strip     . rosuvastatin (CRESTOR) 5 MG tablet Take 1 tablet (5 mg total) by mouth daily. 90 tablet 3   No current facility-administered medications for this visit.     Allergies:   Nitrofurantoin; Sulfa antibiotics; Lisinopril; Macrodantin; Penicillins; and Sulfa drugs cross reactors    Social History:  The patient  reports  that he quit smoking about 42 years ago. He has never used smokeless tobacco. He reports that he does not drink alcohol or use drugs.   Family History:  The patient's family history includes Heart attack in his father and mother; Heart disease in his father and mother.    ROS:   Noted in current history, otherwise review of systems is negative.  Physical Exam: Blood pressure (!) 138/46, pulse 65, height 5\' 10"  (1.778 m), weight 205 lb 6.4 oz (93.2 kg), SpO2 93 %.  GEN:  Well nourished, well developed in no acute distress HEENT: Normal NECK: No JVD; soft bilateral carotid bruits.  LYMPHATICS: No lymphadenopathy CARDIAC: RRR ,  Soft 1-2 / 6 systolic murmur  RESPIRATORY:  Clear to auscultation without rales, wheezing or rhonchi  ABDOMEN: Soft, non-tender, non-distended MUSCULOSKELETAL:  No edema; No deformity  SKIN: Warm and dry NEUROLOGIC:  Alert and oriented x 3     EKG:    April 12, 2018: Normal sinus rhythm at 65.  Small and probably insignificant Q waves in the inferior leads.  T wave inversions in leads 4 through V6.  Recent Labs: 04/11/2018: ALT 15; BUN 24; Creatinine, Ser 1.32; Potassium 3.9; Sodium 139    Lipid Panel    Component Value Date/Time   CHOL 136 04/11/2018 0857   TRIG 147 04/11/2018 0857   HDL 34 (L) 04/11/2018 0857   CHOLHDL 4.0 04/11/2018 0857   CHOLHDL 5.1 (H) 03/25/2015 0920   VLDL 30 03/25/2015 0920   LDLCALC 73 04/11/2018 0857      Wt Readings from Last 3 Encounters:  04/12/18 205 lb 6.4 oz (93.2 kg)  07/13/17 209 lb 1.9 oz (94.9 kg)  02/16/17 213 lb 6.4 oz (96.8 kg)      Other studies Reviewed: Additional studies/ records that were reviewed today include: . Review of the above records demonstrates:    ASSESSMENT AND PLAN:  1. Premature ventricular contractions -     .   2. Anxiety  3. Hypertension - ,  BP is well controlled.   4. Hyperlipidemia -    Labs from yesterday look great on Crestor 5 mg a day .   5. Squamous cell  carcinoma of tongue - s/p resection Alexis Bongo, MD)  6. Bilateral carotid artery disease - soft bilateral bruits Will recheck carotid duplex    8. TIA 9. Mild - moderate Aortic stenosis - exam is stable Will check echo - last one was in 2016.       Current medicines are reviewed  at length with the patient today.  The patient does not have concerns regarding medicines.  The following changes have been made:  no change   Disposition:   FU with me in 1 year    Signed, Alexis Moores, MD  04/12/2018 2:32 PM    [facility] [place], [place], [place]  [address] Phone: [phone]; Fax: [phone]

## 2018-04-25 ENCOUNTER — Ambulatory Visit (HOSPITAL_COMMUNITY)
Admission: RE | Admit: 2018-04-25 | Discharge: 2018-04-25 | Disposition: A | Discharge status: Home / Self Care | Payer: Medicare Other | Source: Ambulatory Visit | Attending: Cardiovascular Disease | Admitting: Cardiovascular Disease

## 2018-04-25 ENCOUNTER — Ambulatory Visit (HOSPITAL_BASED_OUTPATIENT_CLINIC_OR_DEPARTMENT_OTHER): Payer: Medicare Other

## 2018-04-25 DIAGNOSIS — I350 Nonrheumatic aortic (valve) stenosis: Secondary | ICD-10-CM | POA: Insufficient documentation

## 2018-04-25 DIAGNOSIS — I6523 Occlusion and stenosis of bilateral carotid arteries: Secondary | ICD-10-CM | POA: Diagnosis present

## 2018-04-27 ENCOUNTER — Telehealth: Payer: Self-pay | Admitting: Cardiovascular Disease

## 2018-05-05 NOTE — Telephone Encounter (Signed)
Encounter not needed

## 2018-06-07 ENCOUNTER — Other Ambulatory Visit: Payer: Self-pay | Admitting: Cardiovascular Disease

## 2018-10-13 ENCOUNTER — Other Ambulatory Visit: Payer: Self-pay | Admitting: Cardiovascular Disease

## 2019-03-13 ENCOUNTER — Other Ambulatory Visit: Payer: Self-pay | Admitting: Cardiovascular Disease

## 2019-04-10 ENCOUNTER — Other Ambulatory Visit: Payer: Self-pay | Admitting: Cardiovascular Disease

## 2019-04-14 ENCOUNTER — Ambulatory Visit: Payer: Medicare Other | Admitting: Cardiovascular Disease

## 2019-04-30 ENCOUNTER — Ambulatory Visit: Payer: Medicare Other

## 2019-05-23 ENCOUNTER — Encounter: Payer: Self-pay | Admitting: Cardiovascular Disease

## 2019-05-23 NOTE — Progress Notes (Signed)
Cardiology Office Note   Date:  05/23/2019   ID:  [place], Depaulis 1940/09/17, MRN [id number]  PCP:  Haywood Pao, MD  Cardiologist:   Mertie Moores, MD   Chief Complaint  Patient presents with  . Coronary Artery Disease  . Palpitations   Problem List  1. Premature ventricular contractions 2. Anxiety 3. Hypertension 4. Hyperlipidemia 5. 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 Bilateral carotid artery disease - moderate 7. 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 TIA 9. Mild - moderate Aortic stenosis     Alexis Griffith is a 79 y.o. male who presents for further evaluation of his palpitations and HTN.  He is having some muscle aches - especially in the morning.  Seems to be bilateral shoulder pain. He has held the pravachol for several weeks but the pain did not resolved. BP has been a little high. Tried taking 1 1/2 of the Losartan / HCTZ and has occasionally takes extra metoprolol.  September 20, 2014:  Alexis Griffith is doing well.  BP has been well controlled.  Tried a dose of Valsartan and got dizzy, he went back to Losartan after that and has done well.  He needs to have cataract surgery .   Jan. 30, 2017: Doing well  He did not have his cataract surgery as scheduled.    Recent labs look good Aches and pains but otherwise doing ok  Aug. 16, 2017:  Doing ok BP has been well controlled.  Having some left shoulder problems   - hurts at night. 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  No CP ,   Still has occasional elevated HTN  Sept. 25, 2018: Doing well.  Is followed by [name] for a past CVA and is on Aggrenox ( flagged the ASA dose alert )   Jul 13, 2017:   Alexis Griffith is doing well. Is not exercising  Is having some balance issues.   No CP  Still stress in his life.   PVCs seem to be well controlled.  Is taking metoprolol 50 mg TID   April 12, 2018:  No CP , no dyspnea.  Palpitations are under control   May 24, 2019: Alexis Griffith is seen today for follow-up of his hypertension, palpitations, mild aortic stenosis, hyperlipidemia, anxiety. Has rare episodes of chest pain - randomly  No exercising regularly at all.   Past Medical History:  Diagnosis Date  . Anxiety   . Diabetes mellitus without complication ([place])   . Hyperlipidemia   . Hypertension   . Mild aortic stenosis    per echo in 2010  . PVC (premature ventricular contraction)   . Sinus bradycardia on ECG   . Sleep apnea   . Squamous cell carcinoma of tongue [facility]) September 2012   Followed by Dr. Wilburn Cornelia.  . Stroke ([place])   . TIA (transient ischemic attack)     Past Surgical History:  Procedure Laterality Date  . CATARACT EXTRACTION, BILATERAL  10/2015, and 11/2015   with adding  TORIC lenses bilaterally   . SQUAMOUS CELL CARCINOMA EXCISION     tongue     Current Outpatient Medications  Medication Sig Dispense Refill  . glucose blood test strip 1 each by Other route as needed.    . Choline Fenofibrate (TRILIPIX) 135 MG capsule Take 135 mg by mouth daily.      . clonazePAM (KLONOPIN) 0.5  MG tablet Take 0.5 mg by mouth 4 (four) times daily.      . Coenzyme Q10 200 MG capsule Take 200 mg by mouth daily.    Marland Kitchen dipyridamole-aspirin (AGGRENOX) 200-25 MG 12hr capsule Take 1 capsule by mouth 2 (two) times daily. 60 capsule 0  . doxazosin (CARDURA) 4 MG tablet Take 4 mg by mouth at bedtime.      Marland Kitchen GLYXAMBI 25-5 MG TABS Take 1 tablet by mouth daily.    . Lancets (ONETOUCH DELICA PLUS [id number]) [place]     . LANTUS SOLOSTAR 100 UNIT/ML Solostar Pen Inject 60 Units as directed daily.     Marland Kitchen losartan-hydrochlorothiazide (HYZAAR) 100-12.5 MG tablet Take 1 tablet by mouth daily. 30 tablet 8  . metoprolol tartrate (LOPRESSOR) 50 MG tablet TAKE 1 TABLET BY MOUTH 3 TIMES A DAY. 270 tablet 0  . ONE TOUCH ULTRA TEST test strip     . rosuvastatin (CRESTOR) 5 MG tablet Take 1  tablet (5 mg total) by mouth daily at 6 PM. Please keep upcoming appt in February with Dr. Acie Fredrickson before anymore refills. Thank you 90 tablet 0   No current facility-administered medications for this visit.    Allergies:   Nitrofurantoin, Sulfa antibiotics, Lisinopril, Macrodantin, Penicillins, and Sulfa drugs cross reactors    Social History:  The patient  reports that he quit smoking about 43 years ago. He has never used smokeless tobacco. He reports that he does not drink alcohol or use drugs.   Family History:  The patient's family history includes Heart attack in his father and mother; Heart disease in his father and mother.    ROS:   Noted in current history, otherwise review of systems is negative.  Physical Exam: There were no vitals taken for this visit.  GEN:  Well nourished, well developed in no acute distress HEENT: Normal NECK: No JVD; bilat carotid bruits.  LYMPHATICS: No lymphadenopathy CARDIAC: RRR,   2/6 systolic murmur  RESPIRATORY:  Clear to auscultation without rales, wheezing or rhonchi  ABDOMEN: Soft, non-tender, non-distended MUSCULOSKELETAL:  No edema; No deformity  SKIN: Warm and dry NEUROLOGIC:  Alert and oriented x 3      EKG:    March 24 , 2021:  NSR at 14.  TWI in I, V4-V6   Recent Labs: No results found for requested labs within last 8760 hours.    Lipid Panel    Component Value Date/Time   CHOL 136 04/11/2018 0857   TRIG 147 04/11/2018 0857   HDL 34 (L) 04/11/2018 0857   CHOLHDL 4.0 04/11/2018 0857   CHOLHDL 5.1 (H) 03/25/2015 0920   VLDL 30 03/25/2015 0920   LDLCALC 73 04/11/2018 0857      Wt Readings from Last 3 Encounters:  04/12/18 205 lb 6.4 oz (93.2 kg)  07/13/17 209 lb 1.9 oz (94.9 kg)  02/16/17 213 lb 6.4 oz (96.8 kg)      Other studies Reviewed: Additional studies/ records that were reviewed today include: . Review of the above records demonstrates:    ASSESSMENT AND PLAN:  1. Premature ventricular contractions  -     Well controlled.   2. Anxiety  - seems to be fairly controlled.   3. Hypertension - , will increase the losartan HCT to 100//25 mg a day.  He still eats a fair amount of salty foods.  I have encouraged him to work on a better exercise program and to further reduce the salt in his diet.  4. Hyperlipidemia -  continue rosuvastatin.  We will check fasting lipid lipid lipids, liver enzymes, basic metabolic profile on Friday.  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 Bilateral carotid artery disease -   Has mild - mod bilat carotid disease   8. TIA 9. Moderate Aortic stenosis -   Cont current meds.  Will anticipate getting a repeat echo  In a year       Current medicines are reviewed at length with the patient today.  The patient does not have concerns regarding medicines.  The following changes have been made:  no change   Disposition:     Signed, Mertie Moores, MD  05/23/2019 9:18 PM    [facility] [place], [place], [place]  [address] Phone: [phone]; Fax: [phone]

## 2019-05-24 ENCOUNTER — Other Ambulatory Visit: Payer: Self-pay

## 2019-05-24 ENCOUNTER — Encounter: Payer: Self-pay | Admitting: *Deleted

## 2019-05-24 ENCOUNTER — Encounter: Payer: Self-pay | Admitting: Cardiovascular Disease

## 2019-05-24 ENCOUNTER — Ambulatory Visit: Payer: Medicare Other | Admitting: Cardiovascular Disease

## 2019-05-24 VITALS — BP 154/62 | HR 66 | Ht 70.0 in | Wt 207.0 lb

## 2019-05-24 DIAGNOSIS — I10 Essential (primary) hypertension: Secondary | ICD-10-CM | POA: Diagnosis not present

## 2019-05-24 DIAGNOSIS — R079 Chest pain, unspecified: Secondary | ICD-10-CM

## 2019-05-24 DIAGNOSIS — I6523 Occlusion and stenosis of bilateral carotid arteries: Secondary | ICD-10-CM | POA: Diagnosis not present

## 2019-05-24 MED ORDER — LOSARTAN POTASSIUM-HCTZ 100-25 MG PO TABS: 1.0000 | ORAL_TABLET | Freq: Every day | ORAL | 3 refills | Status: DC

## 2019-05-24 NOTE — Patient Instructions (Signed)
Medication Instructions:  Your physician has recommended you make the following change in your medication:  1.) change losartan - hctz to 100/25 mg daily  *If you need a refill on your cardiac medications before your next appointment, please call your pharmacy*   Lab Work: In 3 weeks: return for fasting labs (liver, lipids, bmet) If you have labs (blood work) drawn today and your tests are completely normal, you will receive your results only by: Marland Kitchen MyChart Message (if you have MyChart) OR . A paper copy in the mail If you have any lab test that is abnormal or we need to change your treatment, we will call you to review the results.   Testing/Procedures: Your physician has requested that you have a lexiscan myoview. For further information please visit [email or web address]. Please follow instruction sheet, as given.  Your physician has requested that you have a carotid duplex. This test is an ultrasound of the carotid arteries in your neck. It looks at blood flow through these arteries that supply the brain with blood. Allow one hour for this exam. There are no restrictions or special instructions.   Follow-Up: At [facility], you and your health needs are our priority.  As part of our continuing mission to provide you with exceptional heart care, we have created designated Provider Care Teams.  These Care Teams include your primary Cardiologist (physician) and Advanced Practice Providers (APPs -  Physician Assistants and Nurse Practitioners) who all work together to provide you with the care you need, when you need it.  Your next appointment:   6 month(s)  The format for your next appointment:   In Person  Provider:   You may see or one of the following Advanced Practice Providers on your designated Care Team:    Richardson Dopp, PA-C  [place], [place]  Daune Perch, NP    Other Instructions

## 2019-05-25 ENCOUNTER — Other Ambulatory Visit: Payer: Self-pay | Admitting: Cardiovascular Disease

## 2019-05-25 DIAGNOSIS — I6523 Occlusion and stenosis of bilateral carotid arteries: Secondary | ICD-10-CM

## 2019-05-31 ENCOUNTER — Telehealth: Payer: Self-pay | Admitting: Cardiovascular Disease

## 2019-05-31 NOTE — Telephone Encounter (Signed)
Patient called because he states someone from our office called this morning. After looking through chart it looks like it was just a reminder call for his appt tomorrow for his doppler.

## 2019-06-01 ENCOUNTER — Other Ambulatory Visit: Payer: Self-pay

## 2019-06-01 ENCOUNTER — Ambulatory Visit (HOSPITAL_COMMUNITY)
Admission: RE | Admit: 2019-06-01 | Discharge: 2019-06-01 | Disposition: A | Discharge status: Home / Self Care | Payer: Medicare Other | Source: Ambulatory Visit | Attending: Cardiology | Admitting: Cardiology

## 2019-06-01 DIAGNOSIS — I6523 Occlusion and stenosis of bilateral carotid arteries: Secondary | ICD-10-CM | POA: Insufficient documentation

## 2019-06-02 ENCOUNTER — Telehealth: Payer: Self-pay | Admitting: Nurse Practitioner

## 2019-06-02 DIAGNOSIS — I2510 Atherosclerotic heart disease of native coronary artery without angina pectoris: Secondary | ICD-10-CM

## 2019-06-02 DIAGNOSIS — I6523 Occlusion and stenosis of bilateral carotid arteries: Secondary | ICD-10-CM

## 2019-06-02 NOTE — Telephone Encounter (Signed)
Patient aware of results and verbalizes understanding. He is aware that we will plan to repeat the test in 1 year. He thanked me for the call.

## 2019-06-02 NOTE — Telephone Encounter (Signed)
-----   Message from Thayer Headings, MD sent at 06/02/2019  1:15 PM EDT ----- Mild - mod carotid artery disease Repeat carotid duplex in 1 year

## 2019-06-07 ENCOUNTER — Telehealth (HOSPITAL_COMMUNITY): Payer: Self-pay | Admitting: *Deleted

## 2019-06-07 ENCOUNTER — Encounter (HOSPITAL_COMMUNITY): Payer: Self-pay | Admitting: *Deleted

## 2019-06-07 NOTE — Telephone Encounter (Signed)
Patient's wife per DPR was given detailed instructions per Myocardial Perfusion Study Information Sheet for the test on 06/14/2019 at 1030. Patient notified to arrive 15 minutes early and that it is imperative to arrive on time for appointment to keep from having the test rescheduled.  If you need to cancel or reschedule your appointment, please call the office within 24 hours of your appointment. . Patient verbalized understanding.[place] Mychart letter sent with instructions

## 2019-06-09 ENCOUNTER — Telehealth: Payer: Self-pay | Admitting: Nurse Practitioner

## 2019-06-09 DIAGNOSIS — R079 Chest pain, unspecified: Secondary | ICD-10-CM

## 2019-06-09 NOTE — Telephone Encounter (Signed)
Called patient to discuss his myoview. For the lexiscan, he will need to hold his Aggrenox for 2 days.  Dr. Acie Fredrickson advised that he may take aspirin 81 mg for those 2 days then resume Aggrenox after the test is complete. He states he is not willing to stop Aggrenox due to fear of a stroke. I discussed options with Dr. Acie Fredrickson who advised that we order a coronary CT. I reviewed these instructions with patient and advised that he will receive a call to schedule. I told him that I will also send these instructions to his MyChart. I advised him to call back with questions or concerns. He verbalized understanding and agreement with the plan and thanked me for the call.   Your cardiac CT will be scheduled at one of the below locations:   [facility] [address] [place], [place] [address] [phone]  [place] [address] [place], [place] [address] [phone]  If scheduled at [facility], please arrive at the [facility] main entrance of [facility] 30 minutes prior to test start time. Proceed to the [facility] Radiology Department (first floor) to check-in and test prep.  If scheduled at [facility], please arrive 15 mins early for check-in and test prep.  Please follow these instructions carefully (unless otherwise directed):  Hold all erectile dysfunction medications at least 3 days (72 hrs) prior to test.  On the Night Before the Test: . Be sure to Drink plenty of water. . Do not consume any caffeinated/decaffeinated beverages or chocolate 12 hours prior to your test. . Do not take any antihistamines 12 hours prior to your test. . If you take Metformin do not take 24 hours prior to test.  On the Day of the Test: . Drink plenty of water. Do not drink any water within one hour of the test. . Do not eat any food 4 hours prior to the test. . You may take your regular  medications prior to the test.  . Take metoprolol (Lopressor) two hours prior to test. . HOLD Furosemide/Hydrochlorothiazide morning of the test.       After the Test: . Drink plenty of water. . After receiving IV contrast, you may experience a mild flushed feeling. This is normal. . On occasion, you may experience a mild rash up to 24 hours after the test. This is not dangerous. If this occurs, you can take Benadryl 25 mg and increase your fluid intake. . If you experience trouble breathing, this can be serious. If it is severe call 911 IMMEDIATELY. If it is mild, please call our office. . If you take any of these medications: Glipizide/Metformin, Avandament, Glucavance, please do not take 48 hours after completing test unless otherwise instructed.   Once we have confirmed authorization from your insurance company, we will call you to set up a date and time for your test.   For non-scheduling related questions, please contact the cardiac imaging nurse navigator should you have any questions/concerns: Marchia Bond, RN Navigator Cardiac Imaging [facility] [phone] office  For scheduling needs, including cancellations and rescheduling, please call [phone].

## 2019-06-14 ENCOUNTER — Encounter (HOSPITAL_COMMUNITY): Payer: Medicare Other

## 2019-06-14 ENCOUNTER — Telehealth: Payer: Self-pay | Admitting: Cardiovascular Disease

## 2019-06-14 ENCOUNTER — Other Ambulatory Visit: Payer: Self-pay

## 2019-06-14 ENCOUNTER — Other Ambulatory Visit: Payer: Medicare Other | Admitting: *Deleted

## 2019-06-14 DIAGNOSIS — I10 Essential (primary) hypertension: Secondary | ICD-10-CM

## 2019-06-14 DIAGNOSIS — R079 Chest pain, unspecified: Secondary | ICD-10-CM

## 2019-06-14 DIAGNOSIS — I6523 Occlusion and stenosis of bilateral carotid arteries: Secondary | ICD-10-CM

## 2019-06-14 LAB — BASIC METABOLIC PANEL
BUN/Creatinine Ratio: 17 (ref 10–24)
BUN: 19 mg/dL (ref 8–27)
CO2: 25 mmol/L (ref 20–29)
Calcium: 10.2 mg/dL (ref 8.6–10.2)
Chloride: 98 mmol/L (ref 96–106)
Creatinine, Ser: 1.15 mg/dL (ref 0.76–1.27)
GFR calc Af Amer: 70 mL/min/{1.73_m2} (ref >59)
GFR calc non Af Amer: 61 mL/min/{1.73_m2} (ref >59)
Glucose: 79 mg/dL (ref 65–99)
Potassium: 3.9 mmol/L (ref 3.5–5.2)
Sodium: 138 mmol/L (ref 134–144)

## 2019-06-14 LAB — LIPID PANEL
Chol/HDL Ratio: 4 ratio (ref 0.0–5.0)
Cholesterol, Total: 139 mg/dL (ref 100–199)
HDL: 35 mg/dL — ABNORMAL LOW (ref >39)
LDL Chol Calc (NIH): 80 mg/dL (ref 0–99)
Triglycerides: 135 mg/dL (ref 0–149)
VLDL Cholesterol Cal: 24 mg/dL (ref 5–40)

## 2019-06-14 LAB — HEPATIC FUNCTION PANEL
ALT: 17 IU/L (ref 0–44)
AST: 22 IU/L (ref 0–40)
Albumin: 4.6 g/dL (ref 3.7–4.7)
Alkaline Phosphatase: 42 IU/L (ref 39–117)
Bilirubin Total: 0.3 mg/dL (ref 0.0–1.2)
Bilirubin, Direct: 0.13 mg/dL (ref 0.00–0.40)
Total Protein: 6.9 g/dL (ref 6.0–8.5)

## 2019-06-14 NOTE — Telephone Encounter (Signed)
Patient states he is returning call to [facility] for his lab results.

## 2019-06-21 ENCOUNTER — Other Ambulatory Visit: Payer: Self-pay | Admitting: Cardiovascular Disease

## 2019-06-26 ENCOUNTER — Telehealth: Payer: Self-pay | Admitting: Cardiovascular Disease

## 2019-06-26 ENCOUNTER — Other Ambulatory Visit: Payer: Self-pay | Admitting: Nurse Practitioner

## 2019-06-26 MED ORDER — SITAGLIPTIN PHOSPHATE 100 MG PO TABS: 100.0000 mg | ORAL_TABLET | Freq: Every day | ORAL | 0 refills | Status: DC

## 2019-06-26 NOTE — Telephone Encounter (Signed)
  Pt c/o medication issue:  1. Name of Medication: JANUMET 50-500 MG tablet  2. How are you currently taking this medication (dosage and times per day)?   3. Are you having a reaction (difficulty breathing--STAT)?   4. What is your medication issue? Pt is looking for Sharyn Lull, he would like to ask about the instruction for his CT on Wednesday 06/28/19. He said he is not taking metformin only Janumet and would like to know the same rules apply not to take it 24 hours prior procedure.   Please call

## 2019-06-26 NOTE — Telephone Encounter (Signed)
Spoke with patient who states he takes Janumet 50-500 twice daily and he understands that he cannot take this medication for the next 4 days due to the metformin component and his coronary CT. He requests a Rx for Januvia 100 mg tablets to take over the next 4 days while he is off Janumet. I advised that I will send the Rx to his pharmacy and reviewed the instructions with him to hold Janumet after pm dose today and not to resume until Sunday May 2. He requests that these instructions be sent to his MyChart and he thanked me for my help.

## 2019-06-28 ENCOUNTER — Ambulatory Visit (HOSPITAL_COMMUNITY)
Admission: RE | Admit: 2019-06-28 | Discharge: 2019-06-28 | Disposition: A | Discharge status: Home / Self Care | Payer: Medicare Other | Source: Ambulatory Visit | Attending: Cardiovascular Disease | Admitting: Cardiovascular Disease

## 2019-06-28 ENCOUNTER — Other Ambulatory Visit: Payer: Self-pay

## 2019-06-28 DIAGNOSIS — R079 Chest pain, unspecified: Secondary | ICD-10-CM | POA: Diagnosis present

## 2019-06-28 DIAGNOSIS — I2510 Atherosclerotic heart disease of native coronary artery without angina pectoris: Secondary | ICD-10-CM

## 2019-06-28 DIAGNOSIS — I700 Atherosclerosis of aorta: Secondary | ICD-10-CM | POA: Diagnosis not present

## 2019-06-28 DIAGNOSIS — R911 Solitary pulmonary nodule: Secondary | ICD-10-CM | POA: Diagnosis not present

## 2019-06-28 MED ORDER — IOHEXOL 350 MG/ML SOLN
100.0000 mL | Freq: Once | INTRAVENOUS | Status: AC | PRN
  Administered 2019-06-28: 100 mL via INTRAVENOUS

## 2019-06-28 MED ORDER — NITROGLYCERIN 0.4 MG SL SUBL
0.8000 mg | SUBLINGUAL_TABLET | Freq: Once | SUBLINGUAL | Status: AC
  Administered 2019-06-28: 0.8 mg via SUBLINGUAL

## 2019-06-28 MED ORDER — NITROGLYCERIN 0.4 MG SL SUBL
SUBLINGUAL_TABLET | SUBLINGUAL | Status: AC
  Filled 2019-06-28: qty 2

## 2019-06-29 ENCOUNTER — Telehealth: Payer: Self-pay | Admitting: Cardiovascular Disease

## 2019-06-29 ENCOUNTER — Other Ambulatory Visit: Payer: Self-pay | Admitting: Cardiovascular Disease

## 2019-06-29 NOTE — Telephone Encounter (Signed)
Follow Up:     Pt said he received his CT results on My Chart today and would like to discuss it with [facility] please.

## 2019-06-29 NOTE — Telephone Encounter (Signed)
I spoke to the patient and informed him that the CT has resulted, but not yet read by cardiologist.  We will call him when ready.  He verbalized understanding.

## 2019-07-04 ENCOUNTER — Other Ambulatory Visit: Payer: Self-pay | Admitting: Cardiovascular Disease

## 2019-07-04 ENCOUNTER — Ambulatory Visit: Payer: Medicare Other | Admitting: Cardiovascular Disease

## 2019-07-04 ENCOUNTER — Encounter: Payer: Self-pay | Admitting: Cardiovascular Disease

## 2019-07-04 ENCOUNTER — Other Ambulatory Visit: Payer: Self-pay

## 2019-07-04 VITALS — BP 126/60 | HR 57 | Ht 70.0 in | Wt 205.0 lb

## 2019-07-04 DIAGNOSIS — R931 Abnormal findings on diagnostic imaging of heart and coronary circulation: Secondary | ICD-10-CM

## 2019-07-04 DIAGNOSIS — I10 Essential (primary) hypertension: Secondary | ICD-10-CM | POA: Diagnosis not present

## 2019-07-04 DIAGNOSIS — E782 Mixed hyperlipidemia: Secondary | ICD-10-CM

## 2019-07-04 DIAGNOSIS — I25119 Atherosclerotic heart disease of native coronary artery with unspecified angina pectoris: Secondary | ICD-10-CM | POA: Insufficient documentation

## 2019-07-04 DIAGNOSIS — I25110 Atherosclerotic heart disease of native coronary artery with unstable angina pectoris: Secondary | ICD-10-CM

## 2019-07-04 DIAGNOSIS — I25118 Atherosclerotic heart disease of native coronary artery with other forms of angina pectoris: Secondary | ICD-10-CM | POA: Diagnosis not present

## 2019-07-04 DIAGNOSIS — I2510 Atherosclerotic heart disease of native coronary artery without angina pectoris: Secondary | ICD-10-CM | POA: Insufficient documentation

## 2019-07-04 NOTE — Patient Instructions (Addendum)
Medication Instructions:  Your physician recommends that you continue on your current medications as directed. Please refer to the Current Medication list given to you today.  *If you need a refill on your cardiac medications before your next appointment, please call your pharmacy*   Lab Work: TODAY - CBC, BMET If you have labs (blood work) drawn today and your tests are completely normal, you will receive your results only by: Marland Kitchen MyChart Message (if you have MyChart) OR . A paper copy in the mail If you have any lab test that is abnormal or we need to change your treatment, we will call you to review the results.   Follow-Up: At [facility], you and your health needs are our priority.  As part of our continuing mission to provide you with exceptional heart care, we have created designated Provider Care Teams.  These Care Teams include your primary Cardiologist (physician) and Advanced Practice Providers (APPs -  Physician Assistants and Nurse Practitioners) who all work together to provide you with the care you need, when you need it.    Your next appointment:   2 month(s) on July 14  The format for your next appointment:   In Person  Provider:   You may see Mertie Moores, MD or one of the following Advanced Practice Providers on your designated Care Team:    Richardson Dopp, PA-C  Robbie Lis, [place]   Testing/Procedures:    [facility] OFFICE [place], [place] [address] Dept: [phone] Loc: [place]  07/04/2019  You are scheduled for a Cardiac Catheterization on Friday, May 7 with Dr. Glenetta Hew.  1. Please arrive at the [facility] (Main Entrance A) at [facility]: [address] [place], [place] [address] at 8:30 AM (This time is two hours before your procedure to ensure your preparation). Free valet parking service is available.   Special  note: Every effort is made to have your procedure done on time. Please understand that emergencies sometimes delay scheduled procedures.  2. Diet: Do not eat solid foods after midnight.  The patient may have clear liquids until 5am upon the day of the procedure.  3. Labs: You will need to have blood drawn TODAY . You do not need to be fasting.  Your Pre-procedure COVID-19 Testing will be done on Wednesday May 5 at 9:00 am at [facility] at [id number], [place], [place] [address]. Once you arrive at the testing site, stay in the right hand lane, go under the building overhang not the tent. If you are tested under the tent your results may not be back before your procedure. Please be on time for your appointment.  After your swab you will be given a mask to wear and instructed to go home and quarantine/no visitors until after your procedure. If you test positive you will be notified and your procedure will be cancelled.    4. Medication instructions in preparation for your procedure:   Contrast Allergy: No  Take 1/2- full dose of Lantus insulin on the day before your procedure based on timing and blood sugar reading  Do not take Diabetes Med Janumet (Metformin + Sitagliptin) on the day of the procedure and HOLD 48 HOURS AFTER THE PROCEDURE.  On the morning of your procedure, take your Aspirin and any morning medicines NOT listed above.  You may use sips of  water.  5. Plan for one night stay--bring personal belongings. 6. Bring a current list of your medications and current insurance cards. 7. You MUST have a responsible person to drive you home. 8. Someone MUST be with you the first 24 hours after you arrive home or your discharge will be delayed. 9. Please wear clothes that are easy to get on and off and wear slip-on shoes.  Thank you for allowing [place] to care for you!   -- [facility]

## 2019-07-04 NOTE — H&P (View-Only) (Signed)
Cardiology Office Note   Date:  07/04/2019   ID:  Alexis, Griffith Dec 09, 1940, MRN [id number]  PCP:  Haywood Pao, MD  Cardiologist:   Mertie Moores, MD   Chief Complaint  Patient presents with  . Coronary Artery Disease  . Palpitations   Problem List  1. Premature ventricular contractions 2. Anxiety 3. Hypertension 4. Hyperlipidemia 5. 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 Bilateral carotid artery disease - moderate 7. 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 TIA 9. Mild - moderate Aortic stenosis     Alexis Griffith is a 79 y.o. male who presents for further evaluation of his palpitations and HTN.  He is having some muscle aches - especially in the morning.  Seems to be bilateral shoulder pain. He has held the pravachol for several weeks but the pain did not resolved. BP has been a little high. Tried taking 1 1/2 of the Losartan / HCTZ and has occasionally takes extra metoprolol.  September 20, 2014:  Alexis Griffith is doing well.  BP has been well controlled.  Tried a dose of Valsartan and got dizzy, he went back to Losartan after that and has done well.  He needs to have cataract surgery .   Jan. 30, 2017: Doing well  He did not have his cataract surgery as scheduled.    Recent labs look good Aches and pains but otherwise doing ok  Aug. 16, 2017:  Doing ok BP has been well controlled.  Having some left shoulder problems   - hurts at night. 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  No CP ,   Still has occasional elevated HTN  Sept. 25, 2018: Doing well.  Is followed by [name] for a past CVA and is on Aggrenox ( flagged the ASA dose alert )   Jul 13, 2017:   Alexis Griffith is doing well. Is not exercising  Is having some balance issues.   No CP  Still stress in his life.   PVCs seem to be well controlled.  Is taking metoprolol 50 mg TID   April 12, 2018:  No CP , no dyspnea.  Palpitations are under control   May 24, 2019: Alexis Griffith is seen today for follow-up of his hypertension, palpitations, mild aortic stenosis, hyperlipidemia, anxiety. Has rare episodes of chest pain - randomly  No exercising regularly at all.    Jul 04, 2019:  Alexis Griffith is seen today.  He had complained of chest pain at his last office visit . 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  Ct also showd a 4 mm pulmonary nodule.  He is here to discuss cath .  The cp is on and off.   At times its related to exertion He is not exercising regularly .    Past Medical History:  Diagnosis Date  . Anxiety   . Diabetes mellitus without complication ([place])   . Hyperlipidemia   . Hypertension   . Mild aortic stenosis    per echo in 2010  . PVC (premature ventricular contraction)   . Sinus bradycardia on ECG   . Sleep apnea   . Squamous cell carcinoma of tongue [facility]) September 2012   Followed by Dr. Wilburn Cornelia.  . Stroke ([place])   . TIA (transient ischemic attack)     Past Surgical History:  Procedure Laterality Date  . CATARACT EXTRACTION, BILATERAL  10/2015, and 11/2015  with adding  TORIC lenses bilaterally   . SQUAMOUS CELL CARCINOMA EXCISION     tongue     Current Outpatient Medications  Medication Sig Dispense Refill  . Choline Fenofibrate (TRILIPIX) 135 MG capsule Take 135 mg by mouth daily.      . clonazePAM (KLONOPIN) 0.5 MG tablet Take 0.5 mg by mouth 4 (four) times daily.      . Coenzyme Q10 200 MG capsule Take 200 mg by mouth daily.    Marland Kitchen dipyridamole-aspirin (AGGRENOX) 200-25 MG 12hr capsule Take 1 capsule by mouth 2 (two) times daily. 60 capsule 0  . doxazosin (CARDURA) 4 MG tablet Take 4 mg by mouth at bedtime.      Marland Kitchen glucose blood test strip 1 each by Other route as needed.    Marland Kitchen JANUMET 50-500 MG tablet Take 1 tablet by mouth daily.    . Lancets (ONETOUCH DELICA PLUS 123XX123) [place]     . LANTUS SOLOSTAR 100 UNIT/ML Solostar Pen Inject 65 Units as directed  daily.     Marland Kitchen losartan-hydrochlorothiazide (HYZAAR) 100-25 MG tablet Take 1 tablet by mouth daily. 90 tablet 3  . metoprolol tartrate (LOPRESSOR) 50 MG tablet TAKE 1 TABLET BY MOUTH 3 TIMES A DAY. 270 tablet 3  . ONE TOUCH ULTRA TEST test strip     . rosuvastatin (CRESTOR) 5 MG tablet Take 1 tablet (5 mg total) by mouth daily. 90 tablet 3  . sitaGLIPtin (JANUVIA) 100 MG tablet Take 1 tablet (100 mg total) by mouth daily. (Patient not taking: Reported on 07/04/2019) 5 tablet 0   No current facility-administered medications for this visit.    Allergies:   Nitrofurantoin, Sulfa antibiotics, Lisinopril, Macrodantin, Penicillins, and Sulfa drugs cross reactors    Social History:  The patient  reports that he quit smoking about 44 years ago. He has never used smokeless tobacco. He reports that he does not drink alcohol or use drugs.   Family History:  The patient's family history includes Heart attack in his father and mother; Heart disease in his father and mother.    ROS:   Noted in current history, otherwise review of systems is negative.  Physical Exam: Blood pressure 126/60, pulse (!) 57, height 5\' 10"  (1.778 m), weight 205 lb (93 kg).  GEN:  Well nourished, well developed in no acute distress,  Elderly male  HEENT: Normal NECK: No JVD; soft bilat. Carotid bruits.  LYMPHATICS: No lymphadenopathy CARDIAC: RRR  2/6 systolic murmur  RESPIRATORY:  Clear to auscultation without rales, wheezing or rhonchi  ABDOMEN: Soft, non-tender, non-distended MUSCULOSKELETAL:  No edema; No deformity  SKIN: Warm and dry NEUROLOGIC:  Alert and oriented x 3    EKG:   Jul 04, 2019: Sinus bradycardia 57.  T wave inversions in leads I, aVL, V3 through V6.  This T wave inversion is basically unchanged from 2 months ago but is worse compared to last year.   Recent Labs: 06/14/2019: ALT 17; BUN 19; Creatinine, Ser 1.15; Potassium 3.9; Sodium 138    Lipid Panel    Component Value Date/Time   CHOL 139  06/14/2019 0959   TRIG 135 06/14/2019 0959   HDL 35 (L) 06/14/2019 0959   CHOLHDL 4.0 06/14/2019 0959   CHOLHDL 5.1 (H) 03/25/2015 0920   VLDL 30 03/25/2015 0920   LDLCALC 80 06/14/2019 0959      Wt Readings from Last 3 Encounters:  07/04/19 205 lb (93 kg)  05/24/19 207 lb (93.9 kg)  04/12/18 205 lb 6.4  oz (93.2 kg)      Other studies Reviewed: Additional studies/ records that were reviewed today include: . Review of the above records demonstrates:    ASSESSMENT AND PLAN:  1.  Coronary artery disease: Alexis Griffith presents with some episodes of chest discomfort.  These are occasionally associated with exertion but sometimes not.  Coronary CT angiogram revealed moderate to severe three-vessel coronary artery disease.  He appears to have  disease in the proximal LAD, proximal RCA and proximal circumflex artery.       I think we should proceed with heart catheterization.  We discussed the risks, benefits, options of heart catheterization.  He understands and agrees to proceed.  2. Anxiety  -  Seems to be well controlled.   3. Hypertension - ,   BP is well controlled.   4. Hyperlipidemia -  Lipids have been well controlled.    5. 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 Bilateral carotid artery disease -   Has mild - mod bilat carotid disease   8. TIA 9. Moderate Aortic stenosis -       Current medicines are reviewed at length with the patient today.  The patient does not have concerns regarding medicines.  The following changes have been made:  no change   Disposition:     Signed, Mertie Moores, MD  07/04/2019 11:30 AM    [facility] [place], [place], [place]  [address] Phone: [phone]; Fax: [phone]

## 2019-07-04 NOTE — Progress Notes (Signed)
Cardiology Office Note   Date:  07/04/2019   ID:  Alexis Griffith, Alexis Griffith [birth year], MRN [id number]  PCP:  Haywood Pao, MD  Cardiologist:   Mertie Moores, MD   Chief Complaint  Patient presents with  . Coronary Artery Disease  . Palpitations   Problem List  1. Premature ventricular contractions 2. Anxiety 3. Hypertension 4. Hyperlipidemia 5. 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 Bilateral carotid artery disease - moderate 7. 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 TIA 9. Mild - moderate Aortic stenosis     Alexis Griffith is a 79 y.o. male who presents for further evaluation of his palpitations and HTN.  He is having some muscle aches - especially in the morning.  Seems to be bilateral shoulder pain. He has held the pravachol for several weeks but the pain did not resolved. BP has been a little high. Tried taking 1 1/2 of the Losartan / HCTZ and has occasionally takes extra metoprolol.  September 20, 2014:  Alexis Griffith is doing well.  BP has been well controlled.  Tried a dose of Valsartan and got dizzy, he went back to Losartan after that and has done well.  He needs to have cataract surgery .   Jan. 30, 2017: Doing well  He did not have his cataract surgery as scheduled.    Recent labs look good Aches and pains but otherwise doing ok  Aug. 16, 2017:  Doing ok BP has been well controlled.  Having some left shoulder problems   - hurts at night. 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  No CP ,   Still has occasional elevated HTN  Sept. 25, 2018: Doing well.  Is followed by [name] for a past CVA and is on Aggrenox ( flagged the ASA dose alert )   Jul 13, 2017:   Alexis Griffith is doing well. Is not exercising  Is having some balance issues.   No CP  Still stress in his life.   PVCs seem to be well controlled.  Is taking metoprolol 50 mg TID   April 12, 2018:  No CP , no dyspnea.  Palpitations are under control   May 24, 2019: Alexis Griffith is seen today for follow-up of his hypertension, palpitations, mild aortic stenosis, hyperlipidemia, anxiety. Has rare episodes of chest pain - randomly  No exercising regularly at all.    Jul 04, 2019:  Alexis Griffith is seen today.  He had complained of chest pain at his last office visit . 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  Ct also showd a 4 mm pulmonary nodule.  He is here to discuss cath .  The cp is on and off.   At times its related to exertion He is not exercising regularly .    Past Medical History:  Diagnosis Date  . Anxiety   . Diabetes mellitus without complication ([place])   . Hyperlipidemia   . Hypertension   . Mild aortic stenosis    per echo in 2010  . PVC (premature ventricular contraction)   . Sinus bradycardia on ECG   . Sleep apnea   . Squamous cell carcinoma of tongue [facility]) September 2012   Followed by Dr. Wilburn Cornelia.  . Stroke ([place])   . TIA (transient ischemic attack)     Past Surgical History:  Procedure Laterality Date  . CATARACT EXTRACTION, BILATERAL  10/2015, and 11/2015  with adding  TORIC lenses bilaterally   . SQUAMOUS CELL CARCINOMA EXCISION     tongue     Current Outpatient Medications  Medication Sig Dispense Refill  . Choline Fenofibrate (TRILIPIX) 135 MG capsule Take 135 mg by mouth daily.      . clonazePAM (KLONOPIN) 0.5 MG tablet Take 0.5 mg by mouth 4 (four) times daily.      . Coenzyme Q10 200 MG capsule Take 200 mg by mouth daily.    Marland Kitchen dipyridamole-aspirin (AGGRENOX) 200-25 MG 12hr capsule Take 1 capsule by mouth 2 (two) times daily. 60 capsule 0  . doxazosin (CARDURA) 4 MG tablet Take 4 mg by mouth at bedtime.      Marland Kitchen glucose blood test strip 1 each by Other route as needed.    Marland Kitchen JANUMET 50-500 MG tablet Take 1 tablet by mouth daily.    . Lancets (ONETOUCH DELICA PLUS 123XX123) [place]     . LANTUS SOLOSTAR 100 UNIT/ML Solostar Pen Inject 65 Units as directed  daily.     Marland Kitchen losartan-hydrochlorothiazide (HYZAAR) 100-25 MG tablet Take 1 tablet by mouth daily. 90 tablet 3  . metoprolol tartrate (LOPRESSOR) 50 MG tablet TAKE 1 TABLET BY MOUTH 3 TIMES A DAY. 270 tablet 3  . ONE TOUCH ULTRA TEST test strip     . rosuvastatin (CRESTOR) 5 MG tablet Take 1 tablet (5 mg total) by mouth daily. 90 tablet 3  . sitaGLIPtin (JANUVIA) 100 MG tablet Take 1 tablet (100 mg total) by mouth daily. (Patient not taking: Reported on 07/04/2019) 5 tablet 0   No current facility-administered medications for this visit.    Allergies:   Nitrofurantoin, Sulfa antibiotics, Lisinopril, Macrodantin, Penicillins, and Sulfa drugs cross reactors    Social History:  The patient  reports that he quit smoking about 44 years ago. He has never used smokeless tobacco. He reports that he does not drink alcohol or use drugs.   Family History:  The patient's family history includes Heart attack in his father and mother; Heart disease in his father and mother.    ROS:   Noted in current history, otherwise review of systems is negative.  Physical Exam: Blood pressure 126/60, pulse (!) 57, height 5\' 10"  (1.778 m), weight 205 lb (93 kg).  GEN:  Well nourished, well developed in no acute distress,  Elderly male  HEENT: Normal NECK: No JVD; soft bilat. Carotid bruits.  LYMPHATICS: No lymphadenopathy CARDIAC: RRR  2/6 systolic murmur  RESPIRATORY:  Clear to auscultation without rales, wheezing or rhonchi  ABDOMEN: Soft, non-tender, non-distended MUSCULOSKELETAL:  No edema; No deformity  SKIN: Warm and dry NEUROLOGIC:  Alert and oriented x 3    EKG:   Jul 04, 2019: Sinus bradycardia 57.  T wave inversions in leads I, aVL, V3 through V6.  This T wave inversion is basically unchanged from 2 months ago but is worse compared to last year.   Recent Labs: 06/14/2019: ALT 17; BUN 19; Creatinine, Ser 1.15; Potassium 3.9; Sodium 138    Lipid Panel    Component Value Date/Time   CHOL 139  06/14/2019 0959   TRIG 135 06/14/2019 0959   HDL 35 (L) 06/14/2019 0959   CHOLHDL 4.0 06/14/2019 0959   CHOLHDL 5.1 (H) 03/25/2015 0920   VLDL 30 03/25/2015 0920   LDLCALC 80 06/14/2019 0959      Wt Readings from Last 3 Encounters:  07/04/19 205 lb (93 kg)  05/24/19 207 lb (93.9 kg)  04/12/18 205 lb 6.4  oz (93.2 kg)      Other studies Reviewed: Additional studies/ records that were reviewed today include: . Review of the above records demonstrates:    ASSESSMENT AND PLAN:  1.  Coronary artery disease: Alexis Griffith presents with some episodes of chest discomfort.  These are occasionally associated with exertion but sometimes not.  Coronary CT angiogram revealed moderate to severe three-vessel coronary artery disease.  He appears to have  disease in the proximal LAD, proximal RCA and proximal circumflex artery.       I think we should proceed with heart catheterization.  We discussed the risks, benefits, options of heart catheterization.  He understands and agrees to proceed.  2. Anxiety  -  Seems to be well controlled.   3. Hypertension - ,   BP is well controlled.   4. Hyperlipidemia -  Lipids have been well controlled.    5. 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 Bilateral carotid artery disease -   Has mild - mod bilat carotid disease   8. TIA 9. Moderate Aortic stenosis -       Current medicines are reviewed at length with the patient today.  The patient does not have concerns regarding medicines.  The following changes have been made:  no change   Disposition:     Signed, Mertie Moores, MD  07/04/2019 11:30 AM    [facility] [place], [place], [place]  [address] Phone: [phone]; Fax: [phone]

## 2019-07-05 ENCOUNTER — Other Ambulatory Visit (HOSPITAL_COMMUNITY)
Admission: RE | Admit: 2019-07-05 | Discharge: 2019-07-05 | Disposition: A | Discharge status: Home / Self Care | Payer: Medicare Other | Source: Ambulatory Visit | Attending: Cardiology | Admitting: Cardiology

## 2019-07-05 DIAGNOSIS — Z01812 Encounter for preprocedural laboratory examination: Secondary | ICD-10-CM | POA: Diagnosis present

## 2019-07-05 DIAGNOSIS — Z20822 Contact with and (suspected) exposure to covid-19: Secondary | ICD-10-CM | POA: Insufficient documentation

## 2019-07-05 LAB — BASIC METABOLIC PANEL
BUN/Creatinine Ratio: 19 (ref 10–24)
BUN: 23 mg/dL (ref 8–27)
CO2: 22 mmol/L (ref 20–29)
Calcium: 10.3 mg/dL — ABNORMAL HIGH (ref 8.6–10.2)
Chloride: 99 mmol/L (ref 96–106)
Creatinine, Ser: 1.23 mg/dL (ref 0.76–1.27)
GFR calc Af Amer: 65 mL/min/{1.73_m2} (ref >59)
GFR calc non Af Amer: 56 mL/min/{1.73_m2} — ABNORMAL LOW (ref >59)
Glucose: 96 mg/dL (ref 65–99)
Potassium: 4.4 mmol/L (ref 3.5–5.2)
Sodium: 137 mmol/L (ref 134–144)

## 2019-07-05 LAB — CBC
Hematocrit: 44.4 % (ref 37.5–51.0)
Hemoglobin: 14.5 g/dL (ref 13.0–17.7)
MCH: 27.5 pg (ref 26.6–33.0)
MCHC: 32.7 g/dL (ref 31.5–35.7)
MCV: 84 fL (ref 79–97)
Platelets: 176 10*3/uL (ref 150–450)
RBC: 5.28 x10E6/uL (ref 4.14–5.80)
RDW: 13.4 % (ref 11.6–15.4)
WBC: 7.4 10*3/uL (ref 3.4–10.8)

## 2019-07-05 LAB — SARS CORONAVIRUS 2 (TAT 6-24 HRS): SARS Coronavirus 2: NEGATIVE

## 2019-07-06 ENCOUNTER — Telehealth: Payer: Self-pay | Admitting: *Deleted

## 2019-07-06 NOTE — Telephone Encounter (Addendum)
Erroneous encounter

## 2019-07-06 NOTE — Telephone Encounter (Signed)
Pt contacted pre-catheterization scheduled at [facility] for: Friday Jul 07, 2019 10:30 AM Verified arrival time and place: [facility]) at: 8:30 AM   No solid food after midnight prior to cath, clear liquids until 5 AM day of procedure.  Hold: Losartan-HCT-AM of procedure Januvia-AM of procedure Insulin-1/2 usual dose HS prior to procedure.  Except hold medications AM meds can be  taken pre-cath with sip of water including: ASA 81 mg   Confirmed patient has responsible adult to drive home post procedure and observe 24 hours after arriving home: yes  You are allowed ONE visitor in the waiting room during your procedures. Both you and your visitor must wear masks.      COVID-19 Pre-Screening Questions:  . In the past 7 to 10 days have you had a cough,  shortness of breath, headache, congestion, fever (100 or greater) body aches, chills, sore throat, or sudden loss of taste or sense of smell? No . Have you been around anyone with known Covid 19 in the past 7 to 10 days? no . Have you been around anyone who is awaiting Covid 19 test results in the past 7 to 10 days? no . Have you been around anyone who has mentioned symptoms of Covid 19 within the past 7 to 10 days? no   Reviewed procedure/mask/visitor instructions, COVID-19 screening questions

## 2019-07-07 ENCOUNTER — Encounter (HOSPITAL_COMMUNITY)
Admission: RE | Disposition: A | Discharge status: Home / Self Care | Payer: Self-pay | Source: Home / Self Care | Attending: Cardiology

## 2019-07-07 ENCOUNTER — Other Ambulatory Visit: Payer: Self-pay

## 2019-07-07 ENCOUNTER — Ambulatory Visit (HOSPITAL_COMMUNITY)
Admission: RE | Admit: 2019-07-07 | Discharge: 2019-07-07 | Disposition: A | Discharge status: Home / Self Care | Payer: Medicare Other | Attending: Cardiology | Admitting: Cardiology

## 2019-07-07 DIAGNOSIS — I25119 Atherosclerotic heart disease of native coronary artery with unspecified angina pectoris: Secondary | ICD-10-CM | POA: Diagnosis present

## 2019-07-07 DIAGNOSIS — Z79899 Other long term (current) drug therapy: Secondary | ICD-10-CM | POA: Insufficient documentation

## 2019-07-07 DIAGNOSIS — E119 Type 2 diabetes mellitus without complications: Secondary | ICD-10-CM | POA: Insufficient documentation

## 2019-07-07 DIAGNOSIS — I2582 Chronic total occlusion of coronary artery: Secondary | ICD-10-CM | POA: Diagnosis not present

## 2019-07-07 DIAGNOSIS — Z888 Allergy status to other drugs, medicaments and biological substances status: Secondary | ICD-10-CM | POA: Insufficient documentation

## 2019-07-07 DIAGNOSIS — Z87891 Personal history of nicotine dependence: Secondary | ICD-10-CM | POA: Insufficient documentation

## 2019-07-07 DIAGNOSIS — G4730 Sleep apnea, unspecified: Secondary | ICD-10-CM | POA: Diagnosis not present

## 2019-07-07 DIAGNOSIS — E785 Hyperlipidemia, unspecified: Secondary | ICD-10-CM | POA: Insufficient documentation

## 2019-07-07 DIAGNOSIS — Z85810 Personal history of malignant neoplasm of tongue: Secondary | ICD-10-CM | POA: Diagnosis not present

## 2019-07-07 DIAGNOSIS — Z882 Allergy status to sulfonamides status: Secondary | ICD-10-CM | POA: Diagnosis not present

## 2019-07-07 DIAGNOSIS — I25110 Atherosclerotic heart disease of native coronary artery with unstable angina pectoris: Secondary | ICD-10-CM | POA: Diagnosis not present

## 2019-07-07 DIAGNOSIS — Z881 Allergy status to other antibiotic agents status: Secondary | ICD-10-CM | POA: Diagnosis not present

## 2019-07-07 DIAGNOSIS — Z7982 Long term (current) use of aspirin: Secondary | ICD-10-CM | POA: Insufficient documentation

## 2019-07-07 DIAGNOSIS — I2584 Coronary atherosclerosis due to calcified coronary lesion: Secondary | ICD-10-CM | POA: Diagnosis not present

## 2019-07-07 DIAGNOSIS — Z8249 Family history of ischemic heart disease and other diseases of the circulatory system: Secondary | ICD-10-CM | POA: Diagnosis not present

## 2019-07-07 DIAGNOSIS — I200 Unstable angina: Secondary | ICD-10-CM | POA: Diagnosis present

## 2019-07-07 DIAGNOSIS — I350 Nonrheumatic aortic (valve) stenosis: Secondary | ICD-10-CM | POA: Insufficient documentation

## 2019-07-07 DIAGNOSIS — Z794 Long term (current) use of insulin: Secondary | ICD-10-CM | POA: Diagnosis not present

## 2019-07-07 DIAGNOSIS — I6523 Occlusion and stenosis of bilateral carotid arteries: Secondary | ICD-10-CM | POA: Insufficient documentation

## 2019-07-07 DIAGNOSIS — R931 Abnormal findings on diagnostic imaging of heart and coronary circulation: Secondary | ICD-10-CM | POA: Diagnosis present

## 2019-07-07 DIAGNOSIS — I10 Essential (primary) hypertension: Secondary | ICD-10-CM | POA: Insufficient documentation

## 2019-07-07 DIAGNOSIS — I2510 Atherosclerotic heart disease of native coronary artery without angina pectoris: Secondary | ICD-10-CM | POA: Diagnosis present

## 2019-07-07 DIAGNOSIS — I493 Ventricular premature depolarization: Secondary | ICD-10-CM | POA: Diagnosis present

## 2019-07-07 DIAGNOSIS — F419 Anxiety disorder, unspecified: Secondary | ICD-10-CM | POA: Insufficient documentation

## 2019-07-07 DIAGNOSIS — Z880 Allergy status to penicillin: Secondary | ICD-10-CM | POA: Insufficient documentation

## 2019-07-07 DIAGNOSIS — Z8673 Personal history of transient ischemic attack (TIA), and cerebral infarction without residual deficits: Secondary | ICD-10-CM | POA: Diagnosis not present

## 2019-07-07 HISTORY — PX: LEFT HEART CATH AND CORONARY ANGIOGRAPHY: CATH118249

## 2019-07-07 HISTORY — PX: INTRAVASCULAR PRESSURE WIRE/FFR STUDY: CATH118243

## 2019-07-07 LAB — GLUCOSE, CAPILLARY: Glucose-Capillary: 101 mg/dL — ABNORMAL HIGH (ref 70–99)

## 2019-07-07 LAB — POCT ACTIVATED CLOTTING TIME: Activated Clotting Time: 263 seconds

## 2019-07-07 SURGERY — LEFT HEART CATH AND CORONARY ANGIOGRAPHY
Anesthesia: LOCAL

## 2019-07-07 MED ORDER — ONDANSETRON HCL 4 MG/2ML IJ SOLN: 4.0000 mg | Freq: Four times a day (QID) | INTRAMUSCULAR | Status: DC | PRN

## 2019-07-07 MED ORDER — HYDRALAZINE HCL 20 MG/ML IJ SOLN: 10.0000 mg | INTRAMUSCULAR | Status: DC | PRN

## 2019-07-07 MED ORDER — LABETALOL HCL 5 MG/ML IV SOLN: 10.0000 mg | INTRAVENOUS | Status: DC | PRN

## 2019-07-07 MED ORDER — LIDOCAINE HCL (PF) 1 % IJ SOLN
INTRAMUSCULAR | Status: DC | PRN
  Administered 2019-07-07: 2 mL

## 2019-07-07 MED ORDER — VERAPAMIL HCL 2.5 MG/ML IV SOLN
INTRAVENOUS | Status: AC
  Filled 2019-07-07: qty 2

## 2019-07-07 MED ORDER — HEPARIN (PORCINE) IN NACL 1000-0.9 UT/500ML-% IV SOLN
INTRAVENOUS | Status: DC | PRN
  Administered 2019-07-07 (×2): 500 mL

## 2019-07-07 MED ORDER — HEPARIN SODIUM (PORCINE) 1000 UNIT/ML IJ SOLN
INTRAMUSCULAR | Status: AC
  Filled 2019-07-07: qty 1

## 2019-07-07 MED ORDER — ACETAMINOPHEN 325 MG PO TABS: 650.0000 mg | ORAL_TABLET | ORAL | Status: DC | PRN

## 2019-07-07 MED ORDER — HEPARIN (PORCINE) IN NACL 1000-0.9 UT/500ML-% IV SOLN
INTRAVENOUS | Status: AC
  Filled 2019-07-07: qty 500

## 2019-07-07 MED ORDER — FENTANYL CITRATE (PF) 100 MCG/2ML IJ SOLN
INTRAMUSCULAR | Status: AC
  Filled 2019-07-07: qty 2

## 2019-07-07 MED ORDER — IOHEXOL 350 MG/ML SOLN
INTRAVENOUS | Status: DC | PRN
  Administered 2019-07-07: 150 mL

## 2019-07-07 MED ORDER — ISOSORBIDE MONONITRATE ER 30 MG PO TB24
30.0000 mg | ORAL_TABLET | Freq: Every day | ORAL | 11 refills | Status: DC
Start: 2019-07-07 — End: 2019-08-31

## 2019-07-07 MED ORDER — ASPIRIN 81 MG PO CHEW: 81.0000 mg | CHEWABLE_TABLET | ORAL | Status: DC

## 2019-07-07 MED ORDER — FENTANYL CITRATE (PF) 100 MCG/2ML IJ SOLN
INTRAMUSCULAR | Status: DC | PRN
  Administered 2019-07-07 (×3): 25 ug via INTRAVENOUS

## 2019-07-07 MED ORDER — MIDAZOLAM HCL 2 MG/2ML IJ SOLN
INTRAMUSCULAR | Status: DC | PRN
  Administered 2019-07-07 (×2): 1 mg via INTRAVENOUS

## 2019-07-07 MED ORDER — HEPARIN SODIUM (PORCINE) 1000 UNIT/ML IJ SOLN
INTRAMUSCULAR | Status: DC | PRN
  Administered 2019-07-07: 5000 [IU] via INTRAVENOUS

## 2019-07-07 MED ORDER — LIDOCAINE HCL (PF) 1 % IJ SOLN
INTRAMUSCULAR | Status: AC
  Filled 2019-07-07: qty 30

## 2019-07-07 MED ORDER — VERAPAMIL HCL 2.5 MG/ML IV SOLN: INTRAVENOUS | Status: DC | PRN

## 2019-07-07 MED ORDER — SODIUM CHLORIDE 0.9 % WEIGHT BASED INFUSION
3.0000 mL/kg/h | INTRAVENOUS | Status: AC
  Administered 2019-07-07: 3 mL/kg/h via INTRAVENOUS

## 2019-07-07 MED ORDER — MIDAZOLAM HCL 2 MG/2ML IJ SOLN
INTRAMUSCULAR | Status: AC
  Filled 2019-07-07: qty 2

## 2019-07-07 MED ORDER — SODIUM CHLORIDE 0.9 % WEIGHT BASED INFUSION: 1.0000 mL/kg/h | INTRAVENOUS | Status: DC

## 2019-07-07 SURGICAL SUPPLY — 15 items
CATH LAUNCHER 5F EBU3.0 (CATHETERS) IMPLANT
CATH OPTITORQUE TIG 4.0 5F (CATHETERS) ×2 IMPLANT
CATHETER LAUNCHER 5F EBU3.0 (CATHETERS) ×2
DEVICE RAD COMP TR BAND LRG (VASCULAR PRODUCTS) ×1 IMPLANT
GLIDESHEATH SLEND SS 6F .021 (SHEATH) ×1 IMPLANT
GUIDEWIRE INQWIRE 1.5J.035X260 (WIRE) IMPLANT
GUIDEWIRE PRESSURE X 175 (WIRE) ×1 IMPLANT
INQWIRE 1.5J .035X260CM (WIRE) ×2
KIT ESSENTIALS PG (KITS) ×1 IMPLANT
KIT HEART LEFT (KITS) ×2 IMPLANT
PACK CARDIAC CATHETERIZATION (CUSTOM PROCEDURE TRAY) ×2 IMPLANT
SHEATH PROBE COVER 6X72 (BAG) ×1 IMPLANT
TRANSDUCER W/STOPCOCK (MISCELLANEOUS) ×2 IMPLANT
TUBING CIL FLEX 10 FLL-RA (TUBING) ×2 IMPLANT
WIRE ASAHI PROWATER 180CM (WIRE) ×1 IMPLANT

## 2019-07-07 NOTE — Discharge Instructions (Signed)
Radial Site Care  This sheet gives you information about how to care for yourself after your procedure. Your health care provider may also give you more specific instructions. If you have problems or questions, contact your health care provider. What can I expect after the procedure? After the procedure, it is common to have:  Bruising and tenderness at the catheter insertion area. Follow these instructions at home: Medicines  Take over-the-counter and prescription medicines only as told by your health care provider. Insertion site care  Follow instructions from your health care provider about how to take care of your insertion site. Make sure you: ? Wash your hands with soap and water before you change your bandage (dressing). If soap and water are not available, use hand sanitizer. ? Change your dressing as told by your health care provider. ? Leave stitches (sutures), skin glue, or adhesive strips in place. These skin closures may need to stay in place for 2 weeks or longer. If adhesive strip edges start to loosen and curl up, you may trim the loose edges. Do not remove adhesive strips completely unless your health care provider tells you to do that.  Check your insertion site every day for signs of infection. Check for: ? Redness, swelling, or pain. ? Fluid or blood. ? Pus or a bad smell. ? Warmth.  Do not take baths, swim, or use a hot tub until your health care provider approves.  You may shower 24-48 hours after the procedure, or as directed by your health care provider. ? Remove the dressing and gently wash the site with plain soap and water. ? Pat the area dry with a clean towel. ? Do not rub the site. That could cause bleeding.  Do not apply powder or lotion to the site. Activity   For 24 hours after the procedure, or as directed by your health care provider: ? Do not flex or bend the affected arm. ? Do not push or pull heavy objects with the affected arm. ? Do not  drive yourself home from the hospital or clinic. You may drive 24 hours after the procedure unless your health care provider tells you not to. ? Do not operate machinery or power tools.  Do not lift anything that is heavier than 10 lb (4.5 kg), or the limit that you are told, until your health care provider says that it is safe.  Ask your health care provider when it is okay to: ? Return to work or school. ? Resume usual physical activities or sports. ? Resume sexual activity. General instructions  If the catheter site starts to bleed, raise your arm and put firm pressure on the site. If the bleeding does not stop, get help right away. This is a medical emergency.  If you went home on the same day as your procedure, a responsible adult should be with you for the first 24 hours after you arrive home.  Keep all follow-up visits as told by your health care provider. This is important. Contact a health care provider if:  You have a fever.  You have redness, swelling, or yellow drainage around your insertion site. Get help right away if:  You have unusual pain at the radial site.  The catheter insertion area swells very fast.  The insertion area is bleeding, and the bleeding does not stop when you hold steady pressure on the area.  Your arm or hand becomes pale, cool, tingly, or numb. These symptoms may represent a serious problem   that is an emergency. Do not wait to see if the symptoms will go away. Get medical help right away. Call your local emergency services (911 in the [place] Do not drive yourself to the hospital. Summary  After the procedure, it is common to have bruising and tenderness at the site.  Follow instructions from your health care provider about how to take care of your radial site wound. Check the wound every day for signs of infection.  Do not lift anything that is heavier than 10 lb (4.5 kg), or the limit that you are told, until your health care provider says  that it is safe. This information is not intended to replace advice given to you by your health care provider. Make sure you discuss any questions you have with your health care provider. Document Revised: 03/24/2017 Document Reviewed: 03/24/2017 Elsevier Patient Education  2020 [employer]  

## 2019-07-07 NOTE — Interval H&P Note (Signed)
History and Physical Interval Note:  07/07/2019 10:16 AM  Alexis Griffith  has presented today for surgery, with the diagnosis of Coronary Artery Disease -abnormal cardiac CT angiogram; class III-IV angina, moderate aortic stenosis.  The various methods of treatment have been discussed with the patient and family. After consideration of risks, benefits and other options for treatment, the patient has consented to  Procedure(s): LEFT HEART CATH AND CORONARY ANGIOGRAPHY (N/A)  PERCUTANEOUS CORONARY INTERVENTION  as a surgical intervention.  The patient's history has been reviewed, patient examined, no change in status, stable for surgery.  I have reviewed the patient's chart and labs.  Questions were answered to the patient's satisfaction.     Glenetta Hew

## 2019-07-10 ENCOUNTER — Other Ambulatory Visit: Payer: Self-pay | Admitting: Physician Assistant

## 2019-07-10 DIAGNOSIS — I350 Nonrheumatic aortic (valve) stenosis: Secondary | ICD-10-CM

## 2019-07-11 ENCOUNTER — Other Ambulatory Visit: Payer: Self-pay | Admitting: Physician Assistant

## 2019-07-11 ENCOUNTER — Encounter: Payer: Self-pay | Admitting: Physician Assistant

## 2019-07-11 DIAGNOSIS — I350 Nonrheumatic aortic (valve) stenosis: Secondary | ICD-10-CM

## 2019-07-14 ENCOUNTER — Other Ambulatory Visit: Payer: Self-pay | Admitting: Physician Assistant

## 2019-07-14 ENCOUNTER — Ambulatory Visit (HOSPITAL_COMMUNITY)
Admission: RE | Admit: 2019-07-14 | Discharge: 2019-07-14 | Disposition: A | Discharge status: Home / Self Care | Payer: Medicare Other | Source: Ambulatory Visit | Attending: Physician Assistant | Admitting: Physician Assistant

## 2019-07-14 ENCOUNTER — Encounter (HOSPITAL_COMMUNITY): Payer: Self-pay

## 2019-07-14 ENCOUNTER — Telehealth: Payer: Self-pay | Admitting: Physician Assistant

## 2019-07-14 ENCOUNTER — Other Ambulatory Visit: Payer: Self-pay

## 2019-07-14 DIAGNOSIS — I350 Nonrheumatic aortic (valve) stenosis: Secondary | ICD-10-CM | POA: Diagnosis not present

## 2019-07-14 DIAGNOSIS — N2889 Other specified disorders of kidney and ureter: Secondary | ICD-10-CM

## 2019-07-14 MED ORDER — NITROGLYCERIN 0.4 MG SL SUBL: 0.8000 mg | SUBLINGUAL_TABLET | Freq: Once | SUBLINGUAL | Status: DC

## 2019-07-14 MED ORDER — IOHEXOL 350 MG/ML SOLN
100.0000 mL | Freq: Once | INTRAVENOUS | Status: AC | PRN
  Administered 2019-07-14: 100 mL via INTRAVENOUS

## 2019-07-14 NOTE — Telephone Encounter (Signed)
See CT results.  

## 2019-07-14 NOTE — Telephone Encounter (Signed)
Spoke with Malachy Mood from [facility] calling with CT report for pt, ordering provider, Nell Range, PA-C.  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

## 2019-07-14 NOTE — Telephone Encounter (Signed)
Alexis Griffith from [facility] calling with a CT report.

## 2019-07-17 ENCOUNTER — Ambulatory Visit (HOSPITAL_COMMUNITY): Payer: Medicare Other | Attending: Cardiology

## 2019-07-17 ENCOUNTER — Telehealth: Payer: Self-pay | Admitting: Cardiovascular Disease

## 2019-07-17 ENCOUNTER — Other Ambulatory Visit: Payer: Self-pay

## 2019-07-17 DIAGNOSIS — I350 Nonrheumatic aortic (valve) stenosis: Secondary | ICD-10-CM

## 2019-07-17 NOTE — Telephone Encounter (Signed)
Returned call to patient who states BP is running low since starting Imdur 30 mg. He held losartan/hctz (Hyzaar) 100/25 mg for 2 days and has now restarted it. He reports one BP reading of 99/39 mmHg which prompted him to hold the the Hyzaar. He states he has not had any additional low BP readings since he restarted Hyzaar. He will continue to monitor. I advised him that if BP is significantly lower again that he should hold the cardura and continue Hyzaar or take 1/2 Hyzaar. He states he will call back if it occurs more frequently. He is scheduled for multiple upcoming tests for evaluation of aortic stenosis and additional renal and pulmonary findings on CT. He thanked me for the call.

## 2019-07-17 NOTE — Telephone Encounter (Signed)
New message   Pt c/o medication issue:  1. Name of Medication:   losartan-hydrochlorothiazide (HYZAAR) 100-25 MG tablet     2. How are you currently taking this medication (dosage and times per day)?no   3. Are you having a reaction (difficulty breathing--STAT)? No   4. What is your medication issue? Patient states he is having b/p issues. He wants to know if this medication along with other medications that he is taking is effecting his b/p. Please call to discuss.

## 2019-07-17 NOTE — Telephone Encounter (Signed)
Pt aware. PT scan scheduled for 5/27

## 2019-07-17 NOTE — Telephone Encounter (Signed)
I agree with the plan by Christen Bame, RN

## 2019-07-18 ENCOUNTER — Encounter: Payer: Self-pay | Admitting: Physician Assistant

## 2019-07-18 ENCOUNTER — Institutional Professional Consult (permissible substitution): Payer: Medicare Other | Admitting: Surgery

## 2019-07-18 VITALS — BP 160/68 | HR 59 | Temp 97.6°F | Resp 20 | Ht 70.0 in | Wt 205.0 lb

## 2019-07-18 DIAGNOSIS — I350 Nonrheumatic aortic (valve) stenosis: Secondary | ICD-10-CM | POA: Diagnosis not present

## 2019-07-18 NOTE — Progress Notes (Signed)
Patient ID: Alexis Griffith, male   DOB: [birth year], 79 y.o.   MRN: [id number]  [place] [facility] CONSULTATION REPORT  Referring Provider is Nahser, Wonda Cheng, MD Primary Cardiologist is Mertie Moores, MD PCP is Tisovec, Fransico Him, MD  Chief Complaint  Patient presents with  . Aortic Stenosis    Surgical eval for TAVR, review all testing    HPI:  The patient is a 79 year old gentleman with a history of hypertension, hyperlipidemia, type 2 diabetes, sleep apnea, moderate bilateral carotid artery disease, TIA, and aortic stenosis.  He had a 2D echocardiogram on 04/25/2018 which showed a mean gradient across aortic valve of 29 mmHg with a peak gradient of 54 mmHg.  Valve area was 0.79 cm.  Over the past few months he has been complaining of occasional vague chest discomfort that is not necessarily exertional related.  He underwent a gated coronary CT which showed a calcium score of 2633.  There was severe multivessel calcified plaque.  The FFR was 0.90 in the proximal LAD, 0.78 in the mid LAD, and 0.68 in the distal LAD.  The FFR was 0.87 in the proximal left circumflex, 0.71 in the mid left circumflex, and 0.57 in the distal left circumflex.  The right coronary was occluded proximally.  He subsequently underwent cardiac catheterization on 07/07/2019.  This showed 70% stenosis in the proximal to mid LAD.  The distal LAD had 75% stenosis.  The left circumflex had 75% proximal and mid stenosis.  The right coronary was occluded at its origin with filling of the dominant distal vessel by collaterals from the left.  Left ventricular ejection fraction was 55 to 65%.  The mean gradient measured across the aortic valve was 37 mmHg.  He underwent a repeat echocardiogram yesterday which showed a mean gradient across aortic valve of 43 mmHg with a peak gradient of 62 mmHg.  Aortic valve area was 0.68 cm.  There was mild aortic insufficiency.  Left  ventricular ejection fraction was 55 to 60%.  He subsequently underwent CT scan work-up for consideration of TAVR which incidentally showed a 7.2 x 6.1 x 8.3 cm heterogeneous enhancing left renal mass suspicious for primary renal cell carcinoma.  There are also several small pulmonary nodules throughout the lungs bilaterally with the largest in the right lower lobe measuring 1.3 x 0.9 cm.  The patient is here today with his wife.  He says that he does not exercise or get out to walk that much but stays active doing things around the house and with his family.  He denies any shortness of breath.  Does report some exertional fatigue.  He denies any dizziness or syncope.  Has had no peripheral edema.  He has infrequent episodes of pain across his chest which is not necessarily related to exertion and short-lived.  He said that it does not feel like something sitting on his chest.  Past Medical History:  Diagnosis Date  . Anxiety   . CAD (coronary artery disease)   . Diabetes mellitus without complication ([employer])   . Hyperlipidemia   . Hypertension   . PVC (premature ventricular contraction)   . Severe aortic stenosis   . Sinus bradycardia on ECG   . Sleep apnea   . 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 Wilburn Cornelia.  . Stroke ([place])   . TIA (transient ischemic attack)     Past Surgical History:  Procedure  Laterality Date  . CATARACT EXTRACTION, BILATERAL  10/2015, and 11/2015   with adding  TORIC lenses bilaterally   . INTRAVASCULAR PRESSURE WIRE/FFR STUDY N/A 07/07/2019   Procedure: INTRAVASCULAR PRESSURE WIRE/FFR STUDY;  Surgeon: [name], MD;  Location: [facility];  Service: Cardiovascular;  Laterality: N/A;  . LEFT HEART CATH AND CORONARY ANGIOGRAPHY N/A 07/07/2019   Procedure: LEFT HEART CATH AND CORONARY ANGIOGRAPHY;  Surgeon: [name], MD;  Location: [facility];  Service: Cardiovascular;  Laterality: N/A;  . SQUAMOUS CELL  CARCINOMA EXCISION     tongue    Family History  Problem Relation Age of Onset  . Heart disease Mother   . Heart attack Mother   . Heart disease Father   . Heart attack Father     Social History   Socioeconomic History  . Marital status: Married    Spouse name: Not on file  . Number of children: 2  . Years of education: Not on file  . Highest education level: Not on file  Occupational History  . Not on file  Tobacco Use  . Smoking status: Former Smoker    Quit date: 06/25/1975    Years since quitting: 44.0  . Smokeless tobacco: Never Used  Substance and Sexual Activity  . Alcohol use: No  . Drug use: No  . Sexual activity: Yes  Other Topics Concern  . Not on file  Social History Narrative   Patient lives at home with your wife.      Current Outpatient Medications  Medication Sig Dispense Refill  . acetaminophen (TYLENOL) 500 MG tablet Take 1,000 mg by mouth every 6 (six) hours as needed for moderate pain.    Marland Kitchen bismuth subsalicylate (PEPTO BISMOL) 262 MG/15ML suspension Take 30 mLs by mouth every 6 (six) hours as needed for indigestion or diarrhea or loose stools.    . Choline Fenofibrate (TRILIPIX) 135 MG capsule Take 135 mg by mouth daily.      . clonazePAM (KLONOPIN) 0.5 MG tablet Take 0.5 mg by mouth 4 (four) times daily.      . Coenzyme Q10 200 MG capsule Take 200 mg by mouth daily.    Marland Kitchen dipyridamole-aspirin (AGGRENOX) 200-25 MG 12hr capsule Take 1 capsule by mouth 2 (two) times daily. 60 capsule 0  . doxazosin (CARDURA) 4 MG tablet Take 4 mg by mouth at bedtime.      Marland Kitchen glucose blood test strip 1 each by Other route as needed.    . isosorbide mononitrate (IMDUR) 30 MG 24 hr tablet Take 1 tablet (30 mg total) by mouth daily. 30 tablet 11  . Lancets (ONETOUCH DELICA PLUS [id number]) MISC     . LANTUS SOLOSTAR 100 UNIT/ML Solostar Pen Inject 65 Units as directed daily.     Marland Kitchen losartan-hydrochlorothiazide (HYZAAR) 100-25 MG tablet Take 1 tablet by mouth daily. 90  tablet 3  . metoprolol tartrate (LOPRESSOR) 50 MG tablet TAKE 1 TABLET BY MOUTH 3 TIMES A DAY. (Patient taking differently: Take 50 mg by mouth 3 (three) times daily. ) 270 tablet 3  . ONE TOUCH ULTRA TEST test strip     . rosuvastatin (CRESTOR) 5 MG tablet Take 1 tablet (5 mg total) by mouth daily. 90 tablet 3  . sitaGLIPtin (JANUVIA) 100 MG tablet Take 1 tablet (100 mg total) by mouth daily. 5 tablet 0   No current facility-administered medications for this visit.    Allergies  Allergen Reactions  . Macrodantin [Nitrofurantoin] Anaphylaxis  blocked kidneys   . Sulfa Antibiotics Rash  . Lisinopril Cough  . Penicillins     Unknown      Review of Systems:   General:  normal appetite, + decreased energy, no weight gain, no weight loss, no fever  Cardiac:  + occasional chest pain with exertion, + occasional chest pain at rest, no SOB with exertion, no resting SOB, no PND, no orthopnea, + palpitations, no arrhythmia, no atrial fibrillation, no LE edema, no dizzy spells, no syncope  Respiratory:  no shortness of breath, no home oxygen, no productive cough, no dry cough, no bronchitis, no wheezing, no hemoptysis, no asthma, no pain with inspiration or cough, no sleep apnea, no CPAP at night  GI:   no difficulty swallowing, no reflux, no frequent heartburn, no hiatal hernia, no abdominal pain, no constipation, no diarrhea, no hematochezia, no hematemesis, no melena  GU:   no dysuria,  no frequency, no urinary tract infection, no hematuria, no enlarged prostate, no kidney stones, no kidney disease  Vascular:  no pain suggestive of claudication, no pain in feet, no leg cramps, no varicose veins, no DVT, no non-healing foot ulcer  Neuro:   no stroke, + TIA's, no seizures, no headaches, no temporary blindness one eye,  no slurred speech, no peripheral neuropathy, no chronic pain, no instability of gait, no memory/cognitive dysfunction  Musculoskeletal: no arthritis, no joint swelling, no  myalgias, no difficulty walking, normal mobility   Skin:   no rash, no itching, no skin infections, no pressure sores or ulcerations  Psych:   + anxiety, no depression, no nervousness, + unusual recent stress  Eyes:   no blurry vision, no floaters, no recent vision changes, + wears glasses or contacts  ENT:   no hearing loss, no loose or painful teeth, no dentures, last saw dentist past year  Hematologic:  no easy bruising, no abnormal bleeding, no clotting disorder, no frequent epistaxis  Endocrine:  + diabetes, does check CBG's at home        Physical Exam:   BP (!) 160/68   Pulse (!) 59   Temp 97.6 F (36.4 C) (Skin)   Resp 20   Ht '5\' 10"'$  (1.778 m)   Wt 205 lb (93 kg)   SpO2 97% Comment: RA  BMI 29.41 kg/m   General:  Elderly but  well-appearing  HEENT:  Unremarkable, NCAT, PERLA, EOMI  Neck:   no JVD, + bruit bilaterally , no adenopathy   Chest:   clear to auscultation, symmetrical breath sounds, no wheezes, no rhonchi   CV:   RRR, grade lll/VI crescendo/decrescendo murmur heard best at RSB,  no diastolic murmur  Abdomen:  soft, non-tender, no masses   Extremities:  warm, well-perfused, pulses palpable at ankle, no LE edema  Rectal/GU  Deferred  Neuro:   Grossly non-focal and symmetrical throughout  Skin:   Clean and dry, no rashes, no breakdown   Diagnostic Tests:  ECHOCARDIOGRAM REPORT       Patient Name:  Alexis Griffith Date of Exam: 07/17/2019  Medical Rec #: [id number]   Height:    70.0 in  Accession #:  [id number]   Weight:    205.0 lb  Date of Birth: 1940-12-28   BSA:     2.109 m  Patient Age:  11 years    BP:      126/80 mmHg  Patient Gender: M       HR:      62 bpm.  Exam  Location: [address]   Procedure: 2D Echo, Cardiac Doppler and Color Doppler   Indications:  I35.0 Aortic Stenosis    History:    Patient has prior history of Echocardiogram examinations,  most         recent  04/25/2018. Risk Factors:Hypertension and  Dyslipidemia.         PVC's. TIA.    Sonographer:  Cresenciano Lick RDCS  Referring Phys: [id number] [place]    1. Normal LV systolic function; grade 1 diastolic dysfunction; mild LVH;  calcified aortic valve with severe AS (mean gradient 43 mmHg) and mild AI;  mild MR.  2. Left ventricular ejection fraction, by estimation, is 55 to 60%. The  left ventricle has normal function. The left ventricle has no regional  wall motion abnormalities. There is mild left ventricular hypertrophy.  Left ventricular diastolic parameters  are consistent with Grade I diastolic dysfunction (impaired relaxation).  3. Right ventricular systolic function is normal. The right ventricular  size is normal.  4. The mitral valve is normal in structure. Mild mitral valve  regurgitation. No evidence of mitral stenosis.  5. The aortic valve is normal in structure. Aortic valve regurgitation is  mild. Severe aortic valve stenosis.  6. The inferior vena cava is normal in size with greater than 50%  respiratory variability, suggesting right atrial pressure of 3 mmHg.   FINDINGS  Left Ventricle: Left ventricular ejection fraction, by estimation, is 55  to 60%. The left ventricle has normal function. The left ventricle has no  regional wall motion abnormalities. The left ventricular internal cavity  size was normal in size. There is  mild left ventricular hypertrophy. Left ventricular diastolic parameters  are consistent with Grade I diastolic dysfunction (impaired relaxation).   Right Ventricle: The right ventricular size is normal. Right ventricular  systolic function is normal.   Left Atrium: Left atrial size was normal in size.   Right Atrium: Right atrial size was normal in size.   Pericardium: There is no evidence of pericardial effusion.   Mitral Valve: The mitral valve is normal in structure. Normal mobility of   the mitral valve leaflets. Mild mitral valve regurgitation. No evidence of  mitral valve stenosis.   Tricuspid Valve: The tricuspid valve is normal in structure. Tricuspid  valve regurgitation is trivial. No evidence of tricuspid stenosis.   Aortic Valve: The aortic valve is normal in structure. Aortic valve  regurgitation is mild. Aortic regurgitation PHT measures 675 msec. Severe  aortic stenosis is present. Aortic valve mean gradient measures 43.0 mmHg.  Aortic valve peak gradient measures  61.8 mmHg. Aortic valve area, by VTI measures 0.68 cm.   Pulmonic Valve: The pulmonic valve was normal in structure. Pulmonic valve  regurgitation is trivial. No evidence of pulmonic stenosis.   Aorta: The aortic root is normal in size and structure.   Venous: The inferior vena cava is normal in size with greater than 50%  respiratory variability, suggesting right atrial pressure of 3 mmHg.   IAS/Shunts: No atrial level shunt detected by color flow Doppler.   Additional Comments: Normal LV systolic function; grade 1 diastolic  dysfunction; mild LVH; calcified aortic valve with severe AS (mean  gradient 43 mmHg) and mild AI; mild MR.     LEFT VENTRICLE  PLAX 2D  LVIDd:     4.80 cm Diastology  LVIDs:     3.20 cm LV e' lateral:  6.31 cm/s  LV PW:     1.20  cm LV E/e' lateral: 13.1  LV IVS:    1.20 cm LV e' medial:  5.44 cm/s  LVOT diam:   2.10 cm LV E/e' medial: 15.2  LV SV:     75  LV SV Index:  36  LVOT Area:   3.46 cm     RIGHT VENTRICLE  RV Basal diam: 3.60 cm  RV S prime:   16.50 cm/s  TAPSE (M-mode): 2.5 cm   LEFT ATRIUM       Index    RIGHT ATRIUM      Index  LA diam:    4.50 cm 2.13 cm/m RA Area:   13.50 cm  LA Vol (A2C):  69.1 ml 32.76 ml/m RA Volume:  36.40 ml 17.26 ml/m  LA Vol (A4C):  51.0 ml 24.18 ml/m  LA Biplane Vol: 63.6 ml 30.15 ml/m  AORTIC VALVE  AV Area (Vmax):  0.67 cm  AV Area  (Vmean):  0.64 cm  AV Area (VTI):   0.68 cm  AV Vmax:      393.20 cm/s  AV Vmean:     303.400 cm/s  AV VTI:      1.107 m  AV Peak Grad:   61.8 mmHg  AV Mean Grad:   43.0 mmHg  LVOT Vmax:     76.10 cm/s  LVOT Vmean:    56.350 cm/s  LVOT VTI:     0.218 m  LVOT/AV VTI ratio: 0.20  AI PHT:      675 msec    AORTA  Ao Root diam: 3.50 cm  Ao Asc diam: 3.50 cm   MITRAL VALVE  MV Area (PHT): 2.32 cm  SHUNTS  MV Decel Time: 327 msec  Systemic VTI: 0.22 m  MV E velocity: 82.90 cm/s Systemic Diam: 2.10 cm  MV A velocity: 84.40 cm/s  MV E/A ratio: 0.98   Kirk Ruths MD  Electronically signed by Kirk Ruths MD  Signature Date/Time: 07/18/2019/7:19:05 AM    Physicians Panel Physicians Referring Physician Case Authorizing Physician  [name], MD (Primary)       Procedures INTRAVASCULAR PRESSURE WIRE/FFR STUDY  LEFT HEART CATH AND CORONARY ANGIOGRAPHY     Conclusion Ost LM lesion is 30% stenosed.  Prox LAD to Mid LAD lesion is 70% stenosed with 70% stenosed side branch in 2nd Diag. Both vessels positive by CT FFR  Mid LAD to Dist LAD lesion is 40% stenosed. Dist LAD lesion is 75% stenosed.  Prox Cx lesion is 45% stenosed. Prox Cx to Mid Cx lesion is 75% stenosed. RFR positive is 0.87  Mid Cx lesion is 5% stenosed with 50% stenosed side branch in 3rd Mrg. No significant drop in RFR reading beyond this lesion.  Ost RCA to Dist RCA lesion is 100% stenosed. Significant left to right collaterals fill the PDA and PL system.  RPDA lesion is 50% stenosed.  --------------------------------------  The left ventricular systolic function is normal. The left ventricular ejection fraction is 55-65% by visual estimate. Regional wall motion knowledged and inferior wall.  LV end diastolic pressure is moderately elevated.  There is ~SEVERE AORTIC VALVE STENOSIS. SUMMARY  Severe three-vessel disease: 100% CTO of ostial RCA (left to  right collaterals fill PDA and PL), long 70% calcified proximal to mid LAD (positive by CT FFR), segmental ~75% proximal-mid LCx (positive by RFR)  Borderline SEVERE AORTIC STENOSIS-mean gradient estimated 37 mmHg by pullback.  Normal LVEF with basal inferior hypokinesis seen on echocardiogram and LV gram. Plan will be to  consult CVTS/Cardiology Valve Clinic to consider options between two-vessel PCI and TAVR versus CABG/AVR.  He is not having resting chest pain, will discharge home today on Imdur pending Valve Clinic evaluation.Glenetta Hew, MD      Recommendations Antiplatelet/Anticoag Recommend Aspirin '81mg'$  daily for moderate CAD. He is on Aggrenox - will need to determine treatment with this. We will add Imdur 30 mg daily since he is having intermittent chest pain   Discharge Date In the absence of any other complications or medical issues, we expect the patient to be ready for discharge from a cath perspective on 07/07/2019.   Glenetta Hew, MD        Indications Progressive angina ([place]) [I20.0 (ICD-10-CM)]  Abnormal cardiac CT angiography [R93.1 (ICD-10-CM)]  Coronary artery disease involving native coronary artery of native heart with angina pectoris with documented spasm ([place]) [O87.867 (ICD-10-CM)]     Procedural Details Technical Details Primary CARE Physician: Dr. Domenick Gong Cardiologist: Dr. Mertie Moores  JOHNJOSEPH ROLFE is a very pleasant 79 year old gentleman with hypertension hyperlipidemia as well as prior strokes and moderate aortic stenosis by echocardiogram with normal preserved EF who was recently seen in April by Dr. Acie Fredrickson for worsening chest pain and exertional dyspnea symptoms. He is been having intermittent symptoms for the last month or so. He is evaluated with a coronary CT angiogram which revealed severe multivessel disease and likely occluded RCA. He is now referred for invasive evaluation with cardiac catheterization and possible PCI. Of note, mean  gradient on his echocardiogram his valve was 27 mmHg.  Time Out: Verified patient identification, verified procedure, site/side was marked, verified correct patient position, special equipment/implants available, medications/allergies/relevent history reviewed, required imaging and test results available. Performed.  Access:  RIGHT Radial Artery: 6 Fr sheath -- Seldinger technique using Micropuncture Kit -- Direct ultrasound guidance used. Permanent image obtained and placed on chart. -- 10 mL radial cocktail IA; 5000 Units IV Heparin  Left Heart Catheterization: 5Fr Catheters advanced or exchanged over a J-wire under direct fluoroscopic guidance into the ascending aorta; TIG 4.0 catheter advanced first.  * LV Hemodynamics (LV Gram): TIG 4.0 Catheter * Left Coronary Artery Cineangiography: TIG 4.0 Catheter & EBU 3.5 guide catheter * Right Coronary Artery Cineangiography: TIG 4.0 catheter   Review of initial angiography revealed: Severe CAD similar to what was seen in the CT angiogram. The RCA is totally occluded with risk left to right collaterals. LAD has pretty significant proximal disease at the diagonal branches. The LCx has questionable lesion in the mid section that was read as positive by CT FFR. Also noted significantly increased aortic gradients of 37.2 was supposed to 27 on echocardiogram suggesting probably more severe aortic stenosis.  Preparations are made for RFR/DFR measurement of LCx to confirm CT angiogram findings. -> Additional 5000 units heparin administered --> With the EBU guide catheter in place, the takeoff of the LCx was very close to the left main therefore a Prowater buddy wire was advanced down the circumflex to allow for guide seating to be pulled back and allow for the pressure wire (Guidewire Pressure X 175) to be advanced. Into the LCx. --> DFR recordings were obtained in to the distal LCx-OM 3 with pullbacks recorded along the way. This showed physiologically  significant lesion prior to the bifurcation into OM to OM 3.  Upon completion of Angiogaphy, the catheter was removed completely out of the body over a wire, without complication.  Radial sheath removed in the Cardiac Catheterization lab with  TR Band placed for hemostasis.  TR Band: 1145 Hours; 14 mL air  MEDICATIONS SQ Lidocaine 3 mL Radial Cocktail: 3 mg Verapmil in 10 mL NS Heparin: Total 10,000 units Estimated blood loss <50 mL.   During this procedure medications were administered to achieve and maintain moderate conscious sedation while the patient's heart rate, blood pressure, and oxygen saturation were continuously monitored and I was present face-to-face 100% of this time.     Medications (Filter: Administrations occurring from 07/07/19 0953 to 07/07/19 1203)  midazolam (VERSED) injection (mg) Total dose: 2 mg  Date/Time   Rate/Dose/Volume Action  07/07/19 1018  1 mg Given  1110  1 mg Given  fentaNYL (SUBLIMAZE) injection (mcg) Total dose: 75 mcg  Date/Time   Rate/Dose/Volume Action  07/07/19 1019  25 mcg Given  1105  25 mcg Given  1110  25 mcg Given  Radial Cocktail/Verapamil only Total dose: Cannot be calculated*  *Administration dose not documented  Date/Time   Rate/Dose/Volume Action  07/07/19 1029   Given  heparin sodium (porcine) injection (Units) Total dose: 5,000 Units  Date/Time   Rate/Dose/Volume Action  07/07/19 1039  5,000 Units Given  lidocaine (PF) (XYLOCAINE) 1 % injection (mL) Total volume: 2 mL  Date/Time   Rate/Dose/Volume Action  07/07/19 1027  2 mL Given  iohexol (OMNIPAQUE) 350 MG/ML injection (mL) Total volume: 150 mL  Date/Time   Rate/Dose/Volume Action  07/07/19 1150  150 mL Given  Heparin (Porcine) in NaCl 1000-0.9 UT/500ML-% SOLN (mL) Total volume: 1,000 mL  Date/Time   Rate/Dose/Volume Action  07/07/19 1200  500 mL Given  1201  500 mL Given     Sedation Time Sedation Time Physician-1: 1 hour 24 minutes 34 seconds          Contrast Medication Name Total Dose  iohexol (OMNIPAQUE) 350 MG/ML injection 150 mL     Radiation/Fluoro Fluoro time: 28.7 (min)  DAP: 45364, 68032 (mGycm2)  Cumulative Air Kerma: 122, 482 (mGy)     Complications Complications documented before study signed (07/07/2019 50:03 PM)  No complications were associated with this study.  Documented by [name], MD - 07/07/2019 12:15 PM     Coronary Findings Diagnostic Dominance: Right  Left Main  Vessel was injected. Vessel is large. Very short LEFT MAIN  Ost LM lesion 30% stenosed  Ost LM lesion is 30% stenosed. The lesion is eccentric. The lesion is calcified.   Left Anterior Descending  Prox LAD to Mid LAD lesion 70% stenosed with side branch in 2nd Diag 70% stenosed  Prox LAD to Mid LAD lesion is 70% stenosed with 70% stenosed side branch in 2nd Diag. The lesion is located at the bend, segmental and eccentric. The lesion is moderately calcified. This segment was considered significant by CT FFR (0.76) and is angiographically significant.  Mid LAD to Dist LAD lesion 40% stenosed  Mid LAD to Dist LAD lesion is 40% stenosed.  Dist LAD lesion 75% stenosed  Dist LAD lesion is 75% stenosed. The lesion is focal. Apical   First Diagonal Branch  Vessel is small in size.   First Septal Branch  Vessel is small in size.   Second Diagonal Branch  Vessel is moderate in size.   Second Septal Branch  Vessel is small in size.   Third Septal Branch  Vessel is small in size.   Left Circumflex  Vessel is large. The distal circumflex courses is a 2nd Mrg  Prox Cx lesion 45% stenosed  Prox Cx lesion  is 45% stenosed. The lesion is focal and eccentric. The lesion is mildly calcified.  Prox Cx to Mid Cx lesion 75% stenosed  Prox Cx to Mid Cx lesion is 75% stenosed. The lesion is located proximal to the major branch, segmental, eccentric and irregular. Pressure wire/FFR was performed on the lesion. RFR as follows: OM3 =0.84, Ostial  OM3 = 0.86, pre OM3 = 0.87; pre OM1 = 0.93 --> this indicates that the lesion becomes significant in the segment and does not require the ostial OM 3 lesion.  Mid Cx lesion 5% stenosed with side branch in 3rd Mrg 50% stenosed  Mid Cx lesion is 5% stenosed with 50% stenosed side branch in 3rd Mrg. The lesion is located at the bifurcation and focal. Just after lesion RFR 0.86 & mid branch 0.84   First Obtuse Marginal Branch  Vessel is small in size.   Third Obtuse Marginal Branch  Vessel is moderate in size.   Left Posterior Atrioventricular Artery  Vessel is small in size.   Right Coronary Artery  Ost RCA to Dist RCA lesion 100% stenosed  Ost RCA to Dist RCA lesion is 100% stenosed. The lesion is chronically occluded with left-to-left collateral flow.   Right Posterior Descending Artery  The vessel exhibits minimal luminal irregularities.  Collaterals  RPDA filled by collaterals from 2nd Sept.    Collaterals  RPDA filled by collaterals from 3rd Sept.    RPDA lesion 50% stenosed  RPDA lesion is 50% stenosed. The lesion is distal to major branch and focal.   Right Posterior Atrioventricular Artery  Vessel is moderate in size. The vessel exhibits minimal luminal irregularities.   First Right Posterolateral Branch  Collaterals  1st RPL filled by collaterals from Dist Cx.     Second Right Posterolateral Branch  Vessel is small in size.  Collaterals  2nd RPL filled by collaterals from 3rd Mrg.    Intervention No interventions have been documented.                      Wall Motion Resting               The mid anterior, basilar anterior, apical anterior and apical inferior segments are normal. The mid inferior and basilar inferior segments are hypokinetic.                Left Heart Left Ventricle The left ventricular size is normal. The left ventricular systolic function is normal. LV end diastolic pressure is moderately elevated. The left ventricular ejection  fraction is 55-65% by visual estimate. There are LV function abnormalities due to segmental dysfunction.   Aortic Valve There is severe aortic valve stenosis. Borderline Severe --> Mean gradient 37.2 Hg; P-P 46 mmHg; suspect that is more severe than 2D Echo There is restricted aortic valve motion.     Coronary Diagrams Diagnostic Dominance: Right  &&&&&  Intervention      Implants  No implant documentation for this case.      Syngo Images Link to Procedure Log  Show images for CARDIAC CATHETERIZATION Procedure Log     Images on Long Term Storage   Show images for [place], [place]         [facility] Data   Most Recent Value  Aortic Mean Gradient 37.15 mmHg  Aortic Peak Gradient 46 mmHg  AO Systolic Pressure 956 mmHg  AO Diastolic Pressure 47 mmHg  AO Mean 76 mmHg  LV Systolic Pressure 213 mmHg  LV  Diastolic Pressure 4 mmHg  LV EDP 19 mmHg  AOp Systolic Pressure 528 mmHg  AOp Diastolic Pressure 49 mmHg  AOp Mean Pressure 81 mmHg  LVp Systolic Pressure 413 mmHg  LVp Diastolic Pressure 11 mmHg  LVp EDP Pressure 20 mmHg    ADDENDUM REPORT: 07/14/2019 13:19  CLINICAL DATA:  Aortic stenosis  EXAM: Cardiac TAVR CT  TECHNIQUE: The patient was scanned on a Siemens Force 244 slice scanner. A 120 kV retrospective scan was triggered in the descending thoracic aorta at 111 HU's. Gantry rotation speed was 270 msecs and collimation was .9 mm. No beta blockade or nitro were given. The 3D data set was reconstructed in 5% intervals of the R-R cycle. Systolic and diastolic phases were analyzed on a dedicated work station using MPR, MIP and VRT modes. The patient received 80 cc of contrast.  FINDINGS: Aortic Valve: Calcified tri leaflet with restricted leaflet motion  Aorta: Normal arch vessels no aneurysm no coarctation  Sinotubular Junction: 24 mm heavily calcified  Ascending Thoracic Aorta: 29 mm  Aortic Arch: 26 mm  Descending Thoracic Aorta: 23 mm  Sinus  of Valsalva Measurements:  Non-coronary: 31 mm  Right - coronary: 28 mm  Left - coronary: 29.2 mm  Coronary Artery Height above Annulus:  Left Main: 13.4 mm above annulus  Right Coronary: 14.7 mm above annulus but occluded  Virtual Basal Annulus Measurements:  Maximum/Minimum Diameter: 19.5 mm x 28.9 mm  Perimeter: 81 mm  Area: 512 mm2  Coronary Arteries: Sufficient height above annulus for deployment RCA occluded fills by collaterals from left system  Optimum Fluoroscopic Angle for Delivery: LAO 3 Caudal 3 degrees  IMPRESSION: 1. Calcified tri leaflet AV with annular area of 512 mm 2 suitable for a 26 mm Sapien 3 valve  2. Coronary arteries sufficient height above annulus for deployment Native RCA occluded  3.  Normal aortic root 2.9 cm with no aneurysm  4.  Optimum angiographic angle for deployment LAO 3 Caudal 3 degrees  5.  Significant calcification of the STJ  Jenkins Rouge   Electronically Signed   By: Jenkins Rouge M.D.   On: 07/14/2019 13:19   Addended by Josue Hector, MD on 07/14/2019 1:22 PM    Study Result  EXAM: OVER-READ INTERPRETATION  CT CHEST  The following report is an over-read performed by radiologist Dr. Vinnie Langton of [facility], [place] on 07/14/2019. This over-read does not include interpretation of cardiac or coronary anatomy or pathology. The coronary calcium score/coronary CTA interpretation by the cardiologist is attached.  COMPARISON:  Cardiac CTA 06/28/2019.  FINDINGS: Extracardiac findings will be described separately on dictation for contemporaneously obtained CTA chest, abdomen and pelvis.  IMPRESSION: Please see separate dictation for contemporaneously obtained CTA chest, abdomen and pelvis dated 07/14/2019 for full description of relevant extracardiac findings.  Electronically Signed: By: Vinnie Langton M.D. On: 07/14/2019 12:03       CLINICAL DATA:  79 year old male  with history of severe aortic stenosis. Preprocedural study prior to potential transcatheter aortic valve replacement (TAVR) procedure.  EXAM: CT ANGIOGRAPHY CHEST, ABDOMEN AND PELVIS  TECHNIQUE: Non-contrast CT of the chest was initially obtained.  Multidetector CT imaging through the chest, abdomen and pelvis was performed using the standard protocol during bolus administration of intravenous contrast. Multiplanar reconstructed images and MIPs were obtained and reviewed to evaluate the vascular anatomy.  CONTRAST:  116m OMNIPAQUE IOHEXOL 350 MG/ML SOLN  COMPARISON:  Cardiac CTA 06/28/2019.  FINDINGS: CTA CHEST FINDINGS  Cardiovascular: Heart  size is normal. There is no significant pericardial fluid, thickening or pericardial calcification. There is aortic atherosclerosis, as well as atherosclerosis of the great vessels of the mediastinum and the coronary arteries, including calcified atherosclerotic plaque in the left main, left anterior descending, left circumflex and right coronary arteries. Severe thickening calcification of the aortic valve.  Mediastinum/Lymph Nodes: No pathologically enlarged mediastinal or hilar lymph nodes. Esophagus is unremarkable in appearance. No axillary lymphadenopathy.  Lungs/Pleura: Several pulmonary nodules are noted in the lungs bilaterally, largest of which is in the base of the right lower lobe medially (axial image 63 of series 5) measuring 1.3 x 0.9 cm. The largest nodule in the left lung is in the left upper lobe (axial image 31 of series 5) measuring 9 x 4 mm. No acute consolidative airspace disease. Trace bilateral pleural effusions lying dependently.  Musculoskeletal/Soft Tissues: There are no aggressive appearing lytic or blastic lesions noted in the visualized portions of the skeleton.  CTA ABDOMEN AND PELVIS FINDINGS  Hepatobiliary: No suspicious cystic or solid hepatic lesions. No intra or extrahepatic  biliary ductal dilatation. Tiny calcified gallstone lying dependently in the gallbladder. No findings to suggest an acute cholecystitis at this time.  Pancreas: No pancreatic mass. No pancreatic ductal dilatation. No pancreatic or peripancreatic fluid collections or inflammatory changes.  Spleen: Unremarkable.  Adrenals/Urinary Tract: Large heterogeneously enhancing hypervascular mass in the interpolar region of the left kidney measuring 7.2 x 6.1 x 8.3 cm (axial image 116 of series 4 and coronal image 88 of series 6). This lesion extends laterally toward the left flank wall musculature, without direct invasion. Neovascularity associated with this lesion. This lesion is separated from the left renal vein which is widely patent at this time. Multiple other low-attenuation lesions in the kidneys bilaterally, largest of which are compatible with simple cysts, measuring up to 5.2 cm in diameter in the interpolar region of the left kidney. Bilateral adrenal glands are normal in appearance. No hydroureteronephrosis. Small calcifications in the posterolateral aspect of the urinary bladder, potentially nonobstructive calculi or mural calcifications.  Stomach/Bowel: Normal appearance of the stomach. No pathologic dilatation of small bowel or colon. A few scattered colonic diverticulae are noted, without surrounding inflammatory changes to suggest an acute diverticulitis at this time. Normal appendix.  Vascular/Lymphatic: Aortic atherosclerosis, without evidence of aneurysm or dissection in the abdominal or pelvic vasculature. Vascular findings and measurements pertinent to potential TAVR procedure, as detailed below. No lymphadenopathy noted in the abdomen or pelvis.  Reproductive: Prostate gland is enlarged with median lobe hypertrophy measuring up to 7.0 x 5.7 x 8.1 cm. Seminal vesicles are unremarkable in appearance.  Other: No significant volume of ascites.  No  pneumoperitoneum.  Musculoskeletal: Bilateral pars defects at L5 with 8 mm of anterolisthesis of L5 upon S1. There are no aggressive appearing lytic or blastic lesions noted in the visualized portions of the skeleton.  VASCULAR MEASUREMENTS PERTINENT TO TAVR:  AORTA:  Minimal Aortic Diameter-10 x 10 mm  Severity of Aortic Calcification-severe  RIGHT PELVIS:  Right Common Iliac Artery -  Minimal Diameter-7.3 x 6.1 mm  Tortuosity-mild  Calcification-moderate  Right External Iliac Artery -  Minimal Diameter-7.2 x 7.1 mm  Tortuosity - mild  Calcification-none  Right Common Femoral Artery -  Minimal Diameter-6.5 x 6.4 mm  Tortuosity - mild  Calcification-mild  LEFT PELVIS:  Left Common Iliac Artery -  Minimal Diameter-7.6 x 6.7 mm  Tortuosity - mild  Calcification-moderate  Left External Iliac Artery -  Minimal Diameter-7.7 x  6.9 mm  Tortuosity - mild  Calcification-none  Left Common Femoral Artery -  Minimal Diameter-6.6 x 4.8 mm  Tortuosity - mild  Calcification-mild  Review of the MIP images confirms the above findings.  IMPRESSION: 1. Vascular findings and measurements pertinent to potential TAVR procedure, as detailed above. 2. Severe thickening calcification of the aortic valve, compatible with reported clinical history of severe aortic stenosis. 3. 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 This appears likely encapsulated within Gerota's fascia, does not invade the left renal vein, and is not associated with lymphadenopathy. However, there are several small pulmonary nodules noted throughout the lungs bilaterally. The possibility of metastatic disease to the lungs is not entirely excluded. The largest of these pulmonary nodules is in the right lower lobe measuring 1.3 x 0.9 cm. Urologic consultation is recommended,  and evaluation with PET-CT is suggested in the near future. 4. Cholelithiasis without evidence of acute cholecystitis at this time. 5. Additional incidental findings, as above.  These results will be called to the ordering clinician or representative by the Radiologist Assistant, and communication documented in the PACS or [employer].   Electronically Signed   By: Vinnie Langton M.D.   On: 07/14/2019 12:35   Carotid Arterial Duplex Study   Indications:    Carotid artery disease. No new cerebrovascular  symptoms.           Occasional intermittent chest pain.  Risk Factors:   Hypertension, hyperlipidemia, past history of smoking.  Comparison Study: In 2/20, a carotid duplex showed RICA velocities of  213/27           cm/sec and LICA velocities of 630/16 cm/sec.   Performing Technologist: Mariane Masters RVT     Examination Guidelines: A complete evaluation includes B-mode imaging,  spectral  Doppler, color Doppler, and power Doppler as needed of all accessible  portions  of each vessel. Bilateral testing is considered an integral part of a  complete  examination. Limited examinations for reoccurring indications may be  performed  as noted.     Right Carotid Findings:  +----------+--------+--------+--------+------------------+-----------------  ----+       PSV cm/sEDV cm/sStenosisPlaque DescriptionComments         +----------+--------+--------+--------+------------------+-----------------  ----+  CCA Prox 75   10                             +----------+--------+--------+--------+------------------+-----------------  ----+  CCA Distal51   6                 intimal  thickening    +----------+--------+--------+--------+------------------+-----------------  ----+  ICA Prox 260   30   40-59% heterogenous and based on peak                         calcific     systolic                                   velocity/plaque      +----------+--------+--------+--------+------------------+-----------------  ----+  ICA Mid  134   24                             +----------+--------+--------+--------+------------------+-----------------  ----+  ICA Distal77   13                             +----------+--------+--------+--------+------------------+-----------------  ----+  ECA    232   1    >50%                        +----------+--------+--------+--------+------------------+-----------------  ----+   +----------+--------+-------+----------------+-------------------+       PSV cm/sEDV cmsDescribe    Arm Pressure (mmHG)  +----------+--------+-------+----------------+-------------------+  [id number]       Multiphasic, [id number]          +----------+--------+-------+----------------+-------------------+   +---------+--------+--+--------+-+---------+  VertebralPSV cm/s21EDV cm/s0Antegrade  +---------+--------+--+--------+-+---------+      Left Carotid Findings:  +----------+--------+--------+--------+------------------+--------+       PSV cm/sEDV cm/sStenosisPlaque DescriptionComments  +----------+--------+--------+--------+------------------+--------+  CCA Prox 112   12                      +----------+--------+--------+--------+------------------+--------+  CCA Distal73   7    <50%  smooth            +----------+--------+--------+--------+------------------+--------+  ICA Prox 99   13   1-39%  heterogenous         +----------+--------+--------+--------+------------------+--------+  ICA Mid  89   8                        +----------+--------+--------+--------+------------------+--------+  ICA Distal66   11                      +----------+--------+--------+--------+------------------+--------+  ECA    87   1                       +----------+--------+--------+--------+------------------+--------+   +----------+--------+--------+----------------+-------------------+       PSV cm/sEDV cm/sDescribe    Arm Pressure (mmHG)  +----------+--------+--------+----------------+-------------------+  [id number]       Multiphasic, [id number]          +----------+--------+--------+----------------+-------------------+   +---------+--------+--+--------+--+---------+  VertebralPSV cm/s48EDV cm/s11Antegrade  +---------+--------+--+--------+--+---------+        Summary:  Right Carotid: Velocities in the right ICA are consistent with a 40-59%         stenosis, based on peak systolic velocities. The ECA  appears >50%         stenosed.   Left Carotid: Velocities in the left ICA are consistent with a 1-39%  stenosis.        Non-hemodynamically significant plaque <50% noted in the  CCA.   Vertebrals: Left vertebral artery demonstrates antegrade flow. Right  vertebral        artery demonstrates high resistant flow.  Subclavians: Normal flow hemodynamics were seen in bilateral subclavian        arteries.   *See table(s) above for measurements and observations.   Suggest follow up study in 12 months.    Electronically signed by Jenkins Rouge MD on 06/01/2019 at 5:00:01 PM.      Procedure: AVR + CAB   Risk of Mortality:  2.477%  Renal Failure:  3.185%  Permanent Stroke:  2.195%  Prolonged Ventilation:  8.996%  DSW Infection:  0.186%  Reoperation:  3.739%  Morbidity or Mortality:  14.762%  Short Length of Stay:    29.486%  Long Length of Stay:  6.738%      Impression:  This 79 year old gentleman has stage D, severe, symptomatic aortic stenosis with New York Heart Association class II symptoms of exertional fatigue consistent with chronic diastolic congestive heart failure.  He has not physically very active which probably limits his symptoms.  I have personally reviewed his 2D echocardiogram, cardiac catheterization, and CTA  studies.  His echocardiogram shows a trileaflet aortic valve with severe calcification, leaflet thickening, and restricted mobility.  The aortic valve mean gradient was 43 mmHg which is increased significantly compared to his echo in February 2020 when the mean gradient was 29 mmHg.  The aortic valve area by VTI measures 0.68 cm consistent with severe aortic stenosis.  Left ventricular ejection fraction normal.  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  The LAD and left circumflex have what I would consider moderate stenoses of 70% at the most.  The RFR was abnormal in the mid to distal portion of the LAD and left circumflex with an occluded right coronary artery supplied by collaterals but the stenoses are not high-grade and he only has vague occasional chest discomfort that may not be angina.  I think his severe aortic stenosis and coronary disease could be treated with open surgical AVR and CABG but given his age and complicating factors discovered on his CT scan work-up I think TAVR and medical treatment of his coronary disease is the best option.  His gated cardiac CTA shows anatomy suitable for transcatheter aortic valve replacement using a SAPIEN 3 valve.  His abdominal and pelvic CTA shows anatomy suitable for transfemoral insertion.  The CT scans also showed a large mass in the left kidney highly suspicious for renal cell carcinoma as well as multiple lung nodules largest  of which measures 1.3 x 0.9 cm in the right lower lobe that could be a metastatic lesion.  I think would be best to avoid open surgical AVR and CABG in this situation which would take him 3 to 6 months to recover before he could have any surgical procedure on his kidney.  I think it is probably also best to avoid PCI for his coronary disease at this time which would require dual antiplatelet therapy and a delay of the least 3 months in treating his kidney disease.  I think he should probably undergo TAVR and subsequent surgery on his kidney if indicated with low surgical risk since he does not have any high grade coronary stenoses and the RCA is well collateralized.  He will require a PET scan for further work-up of the left kidney mass and lung nodules and this has been scheduled for 07/27/2019.  He has an appointment tomorrow with Dr. Karsten Ro for evaluation of the left kidney mass.  I reviewed the echocardiogram, cardiac catheterization, and CTA images with the patient and his wife and answered all their questions.  I discussed the transcatheter aortic valve replacement procedure, indications, alternatives, and risks.  I gave them an informational booklet about the procedure.     Plan:   I will discuss his case with our multidisciplinary heart valve team and await the results of his urology evaluation and PET scan before scheduling TAVR.  I spent 60 minutes performing this consultation and > 50% of this time was spent face to face counseling and coordinating the care of this patient's severe aortic stenosis.   Gaye Pollack, MD 07/18/2019

## 2019-07-19 ENCOUNTER — Encounter: Payer: Self-pay | Admitting: Surgery

## 2019-07-26 ENCOUNTER — Ambulatory Visit (HOSPITAL_COMMUNITY): Payer: Medicare Other

## 2019-07-27 ENCOUNTER — Ambulatory Visit (HOSPITAL_COMMUNITY)
Admission: RE | Admit: 2019-07-27 | Discharge: 2019-07-27 | Disposition: A | Discharge status: Home / Self Care | Payer: Medicare Other | Source: Ambulatory Visit | Attending: Physician Assistant | Admitting: Physician Assistant

## 2019-07-27 ENCOUNTER — Other Ambulatory Visit: Payer: Self-pay

## 2019-07-27 DIAGNOSIS — R918 Other nonspecific abnormal finding of lung field: Secondary | ICD-10-CM | POA: Insufficient documentation

## 2019-07-27 DIAGNOSIS — K5730 Diverticulosis of large intestine without perforation or abscess without bleeding: Secondary | ICD-10-CM | POA: Diagnosis not present

## 2019-07-27 DIAGNOSIS — N2889 Other specified disorders of kidney and ureter: Secondary | ICD-10-CM | POA: Diagnosis not present

## 2019-07-27 DIAGNOSIS — I2510 Atherosclerotic heart disease of native coronary artery without angina pectoris: Secondary | ICD-10-CM | POA: Diagnosis not present

## 2019-07-27 DIAGNOSIS — N210 Calculus in bladder: Secondary | ICD-10-CM | POA: Diagnosis not present

## 2019-07-27 DIAGNOSIS — I700 Atherosclerosis of aorta: Secondary | ICD-10-CM | POA: Diagnosis not present

## 2019-07-27 DIAGNOSIS — J90 Pleural effusion, not elsewhere classified: Secondary | ICD-10-CM | POA: Diagnosis not present

## 2019-07-27 DIAGNOSIS — K8020 Calculus of gallbladder without cholecystitis without obstruction: Secondary | ICD-10-CM | POA: Diagnosis not present

## 2019-07-27 DIAGNOSIS — N400 Enlarged prostate without lower urinary tract symptoms: Secondary | ICD-10-CM | POA: Insufficient documentation

## 2019-07-27 DIAGNOSIS — I517 Cardiomegaly: Secondary | ICD-10-CM | POA: Insufficient documentation

## 2019-07-27 LAB — GLUCOSE, CAPILLARY: Glucose-Capillary: 79 mg/dL (ref 70–99)

## 2019-07-27 MED ORDER — FLUDEOXYGLUCOSE F - 18 (FDG) INJECTION
10.5000 | Freq: Once | INTRAVENOUS | Status: AC
  Administered 2019-07-27: 10.5 via INTRAVENOUS

## 2019-08-01 ENCOUNTER — Other Ambulatory Visit: Payer: Self-pay | Admitting: Physician Assistant

## 2019-08-01 DIAGNOSIS — I350 Nonrheumatic aortic (valve) stenosis: Secondary | ICD-10-CM

## 2019-08-03 ENCOUNTER — Other Ambulatory Visit: Payer: Self-pay | Admitting: Physician Assistant

## 2019-08-03 ENCOUNTER — Encounter: Payer: Self-pay | Admitting: Physician Assistant

## 2019-08-03 DIAGNOSIS — I350 Nonrheumatic aortic (valve) stenosis: Secondary | ICD-10-CM

## 2019-08-09 ENCOUNTER — Other Ambulatory Visit: Payer: Self-pay

## 2019-08-09 ENCOUNTER — Encounter: Payer: Self-pay | Admitting: Surgery

## 2019-08-09 ENCOUNTER — Ambulatory Visit: Payer: Medicare Other | Admitting: Surgery

## 2019-08-09 VITALS — BP 103/61 | HR 64 | Temp 97.9°F | Resp 20 | Ht 70.0 in | Wt 200.2 lb

## 2019-08-09 DIAGNOSIS — I350 Nonrheumatic aortic (valve) stenosis: Secondary | ICD-10-CM | POA: Diagnosis not present

## 2019-08-09 NOTE — Progress Notes (Signed)
HPI:  The patient returns today to review the results of his recent PET scan and make further plans for transcatheter aortic valve replacement.  The PET scan showed a hypermetabolic exophytic 7 cm renal cortical mass with a maximum SUV of 8.1 that is most likely a renal cell carcinoma.  There is no other abnormal hypermetabolic activity within the abdomen.  There was anal hypermetabolism which is most likely due to muscle activity.  The 1.2 cm dominant right lower lobe pulmonary nodule was not hypermetabolic.  There were a few additional small solid pulmonary nodules scattered in both lungs measuring up to 0.5 cm in the left upper lobe are all below PET resolution.  Current Outpatient Medications  Medication Sig Dispense Refill  . acetaminophen (TYLENOL) 500 MG tablet Take 1,000 mg by mouth every 6 (six) hours as needed for moderate pain.    . carboxymethylcellulose (REFRESH PLUS) 0.5 % SOLN Place 1 drop into both eyes 2 (two) times daily as needed (dry eyes).    . Choline Fenofibrate (TRILIPIX) 135 MG capsule Take 135 mg by mouth daily.      . clonazePAM (KLONOPIN) 0.5 MG tablet Take 0.5 mg by mouth 4 (four) times daily.      . Coenzyme Q10 200 MG capsule Take 200 mg by mouth daily.    Marland Kitchen dipyridamole-aspirin (AGGRENOX) 200-25 MG 12hr capsule Take 1 capsule by mouth 2 (two) times daily. 60 capsule 0  . doxazosin (CARDURA) 4 MG tablet Take 4 mg by mouth at bedtime.      Marland Kitchen glucose blood test strip 1 each by Other route as needed.    . isosorbide mononitrate (IMDUR) 30 MG 24 hr tablet Take 1 tablet (30 mg total) by mouth daily. 30 tablet 11  . Lancets (ONETOUCH DELICA PLUS [id number]) MISC     . LANTUS SOLOSTAR 100 UNIT/ML Solostar Pen Inject 65 Units into the skin daily.     Marland Kitchen losartan-hydrochlorothiazide (HYZAAR) 100-25 MG tablet Take 1 tablet by mouth daily. 90 tablet 3  . metoprolol tartrate (LOPRESSOR) 50 MG tablet TAKE 1 TABLET BY MOUTH 3 TIMES A DAY. (Patient taking differently: Take 50  mg by mouth 3 (three) times daily. ) 270 tablet 3  . ONE TOUCH ULTRA TEST test strip     . rosuvastatin (CRESTOR) 5 MG tablet Take 1 tablet (5 mg total) by mouth daily. 90 tablet 3  . sitaGLIPtin (JANUVIA) 100 MG tablet Take 1 tablet (100 mg total) by mouth daily. 5 tablet 0   No current facility-administered medications for this visit.     Physical Exam: BP 103/61 (BP Location: Left Arm, Patient Position: Sitting, Cuff Size: Normal)   Pulse 64   Temp 97.9 F (36.6 C) (Temporal)   Resp 20   Ht 5\' 10"  (1.778 m)   Wt 200 lb 3.2 oz (90.8 kg)   SpO2 95% Comment: RA  BMI 28.73 kg/m    Diagnostic Tests:  CLINICAL DATA:  Initial treatment strategy for incidental malignant left renal mass with indeterminate pulmonary nodules on recent pre-TAVR CT angiogram study.  EXAM: NUCLEAR MEDICINE PET SKULL BASE TO THIGH  TECHNIQUE: 10.5 mCi F-18 FDG was injected intravenously. Full-ring PET imaging was performed from the skull base to thigh after the radiotracer. CT data was obtained and used for attenuation correction and anatomic localization.  Fasting blood glucose: 79 mg/dl  COMPARISON:  07/14/2019 CT angiogram of the chest, abdomen and pelvis.  FINDINGS: Mediastinal blood pool activity: SUV max 3.2  Liver activity: SUV max NA  NECK: No hypermetabolic lymph nodes in the neck.  Incidental CT findings: none  CHEST: No enlarged or hypermetabolic axillary, mediastinal or hilar lymph nodes. No hypermetabolic pulmonary findings. Dominant medial right lower lobe 1.2 cm solid pulmonary nodule (series 8/image 43) is non hypermetabolic. A few additional small solid pulmonary nodules scattered in both lungs measuring up to 0.5 cm in the left upper lobe (series 8/image 18) are below PET resolution.  Incidental CT findings: Coronary atherosclerosis. Mild cardiomegaly. Atherosclerotic nonaneurysmal thoracic aorta. Trace dependent bilateral pleural  effusions.  ABDOMEN/PELVIS: No abnormal hypermetabolic activity within the liver, pancreas, adrenal glands, or spleen. No hypermetabolic lymph nodes in the abdomen or pelvis.  Hypermetabolic exophytic 7.0 x 6.2 cm renal cortical mass in the lateral interpolar left kidney with max SUV 8.1 (series 4/image 128).  Anal hypermetabolism with max SUV 8.0, without discrete CT correlate.  Incidental CT findings: Cholelithiasis. Mild colonic diverticulosis. Marked prostatomegaly. Layering 5 mm left bladder stone. Atherosclerotic nonaneurysmal abdominal aorta.  SKELETON: No focal hypermetabolic activity to suggest skeletal metastasis.  Incidental CT findings: none  IMPRESSION: 1. Hypermetabolic exophytic 7.0 cm renal cortical mass in the lateral interpolar left kidney, compatible with primary renal cell carcinoma. 2. No hypermetabolic locoregional lymphadenopathy or distant metastatic disease. 3. Dominant right lower lobe 1.2 cm solid pulmonary nodule is non-hypermetabolic. Additional small solid subcentimeter pulmonary nodules in both lungs are below PET resolution and warrant attention on follow-up chest CT in 3 months. 4. Nonspecific anal hypermetabolism without discrete CT correlate, potentially physiologic, with anal neoplasm not entirely excluded. Suggest correlation with directed clinical history and exam. 5. Mild cardiomegaly.  Trace dependent bilateral pleural effusions. 6. Chronic findings include: Aortic Atherosclerosis (ICD10-I70.0). Cholelithiasis. Mild colonic diverticulosis. Marked prostatomegaly. Layering 5 mm left bladder stone.   Electronically Signed   By: Ilona Sorrel M.D.   On: 07/27/2019 16:17   Impression:  This 79 year old gentleman has stage D, severe, symptomatic aortic stenosis.  He also has a large left renal cortical mass that is likely a renal cell carcinoma.  There are bilateral pulmonary nodules with a dominant 1.2 cm nodule in the right  lower lobe but this is not hypermetabolic.  There is no evidence of local regional or distant metastatic disease on PET scan.  His cardiac catheterization shows three-vessel coronary disease with a chronically occluded ostial right coronary artery filling by collaterals from the left and moderate stenoses in the LAD and left circumflex.  I think the best option for this patient would be to proceed with transcatheter aortic valve replacement with medical treatment of his coronary disease followed by left nephrectomy by urology for his left kidney mass.  He said that he has had no further chest discomfort since being placed on Imdur and I think his coronary disease could be treated medically unless he develops recurrent anginal symptoms or exertional shortness of breath and fatigue following valve replacement.  The patient and his wife were counseled at length regarding treatment alternatives for management of severe symptomatic aortic stenosis. The risks and benefits of surgical intervention has been discussed in detail. Long-term prognosis with medical therapy was discussed. Alternative approaches such as conventional surgical aortic valve replacement, transcatheter aortic valve replacement, and palliative medical therapy were compared and contrasted at length. This discussion was placed in the context of the patient's own specific clinical presentation and past medical history. All of their questions have been addressed.   Following the decision to proceed with transcatheter aortic valve replacement,  a discussion was held regarding what types of management strategies would be attempted intraoperatively in the event of life-threatening complications, including whether or not the patient would be considered a candidate for the use of cardiopulmonary bypass and/or conversion to open sternotomy for attempted surgical intervention. The patient is aware of the fact that transient use of cardiopulmonary bypass may be  necessary.  He is a low surgical risk patient and I would consider him a candidate for emergent sternotomy if needed to manage any intraoperative complications.  The patient has been advised of a variety of complications that might develop including but not limited to risks of death, stroke, paravalvular leak, aortic dissection or other major vascular complications, aortic annulus rupture, device embolization, cardiac rupture or perforation, mitral regurgitation, acute myocardial infarction, arrhythmia, heart block or bradycardia requiring permanent pacemaker placement, congestive heart failure, respiratory failure, renal failure, pneumonia, infection, other late complications related to structural valve deterioration or migration, or other complications that might ultimately cause a temporary or permanent loss of functional independence or other long term morbidity. The patient provides full informed consent for the procedure as described and all questions were answered.      Plan:  He will be scheduled for transfemoral transcatheter aortic valve placement on Tuesday, 08/15/2019.  I spent 15 minutes performing this established patient evaluation and > 50% of this time was spent face to face counseling and coordinating the care of this patient's severe aortic stenosis and multivessel coronary disease.    Gaye Pollack, MD [facility] [phone]

## 2019-08-10 NOTE — Progress Notes (Signed)
Your procedure is scheduled on Tuesday June 15.  Report to [facility] Main Entrance "A" at 05:30 A.M., and check in at the Admitting office.  Call this number if you have problems the morning of surgery: [phone]  Call [phone] if you have any questions prior to your surgery date Monday-Friday 8am-4pm   Remember: Do not eat or drink after midnight the night before your surgery   Per your surgeon: Please continue taking all of your current medications through the day before surgery. On the day of surgery take no medications  As of today, STOP taking any Aspirin (unless otherwise instructed by your surgeon) or Aspirin containing products, Aleve, Naproxen, Ibuprofen, Motrin, Advil, Goody's, BC's, all herbal medications, fish oil, and all vitamins.   WHAT DO I DO ABOUT MY DIABETES MEDICATION?  . THE DAY BEFORE SURGERY  o LANTUS SOLOSTAR -  Inject 32.5 units o sitaGLIPtin (JANUVIA) - take usual dose   . THE MORNING OF SURGERY o LANTUS SOLOSTAR -  Inject 32.5 units o sitaGLIPtin (JANUVIA) - NONE  Do not take oral diabetes medicines (pills) the morning of surgery.   HOW TO MANAGE YOUR DIABETES BEFORE AND AFTER SURGERY  Why is it important to control my blood sugar before and after surgery? . Improving blood sugar levels before and after surgery helps healing and can limit problems. . A way of improving blood sugar control is eating a healthy diet by: o  Eating less sugar and carbohydrates o  Increasing activity/exercise o  Talking with your doctor about reaching your blood sugar goals . High blood sugars (greater than 180 mg/dL) can raise your risk of infections and slow your recovery, so you will need to focus on controlling your diabetes during the weeks before surgery. . Make sure that the doctor who takes care of your diabetes knows about your planned surgery including the date and location.  How do I manage my blood sugar before surgery? . Check your blood sugar  at least 4 times a day, starting 2 days before surgery, to make sure that the level is not too high or low. . Check your blood sugar the morning of your surgery when you wake up and every 2 hours until you get to the Short Stay unit. o If your blood sugar is less than 70 mg/dL, you will need to treat for low blood sugar: - Do not take insulin. - Treat a low blood sugar (less than 70 mg/dL) with  cup of clear juice (cranberry or apple), 4 glucose tablets, OR glucose gel. - Recheck blood sugar in 15 minutes after treatment (to make sure it is greater than 70 mg/dL). If your blood sugar is not greater than 70 mg/dL on recheck, call [phone] for further instructions. . Report your blood sugar to the short stay nurse when you get to Short Stay.  . If you are admitted to the hospital after surgery: o Your blood sugar will be checked by the staff and you will probably be given insulin after surgery (instead of oral diabetes medicines) to make sure you have good blood sugar levels. o The goal for blood sugar control after surgery is 80-180 mg/dL.     The Morning of Surgery  Do not wear jewelr  Do not wear lotions, powders, colognes, or deodorant Men may shave face and neck.  Do not bring valuables to the hospital.  [facility] is not responsible for any belongings or valuables.  If you are a smoker, DO  NOT Smoke 24 hours prior to surgery  If you wear a CPAP at night please bring your mask the morning of surgery   Remember that you must have someone to transport you home after your surgery, and remain with you for 24 hours if you are discharged the same day.   Please bring cases for contacts, glasses, hearing aids, dentures or bridgework because it cannot be worn into surgery.    Leave your suitcase in the car.  After surgery it may be brought to your room.  For patients admitted to the hospital, discharge time will be determined by your treatment team.  Patients discharged the day of  surgery will not be allowed to drive home.    Special instructions:   [place]- Preparing For Surgery  Before surgery, you can play an important role. Because skin is not sterile, your skin needs to be as free of germs as possible. You can reduce the number of germs on your skin by washing with CHG (chlorahexidine gluconate) Soap before surgery.  CHG is an antiseptic cleaner which kills germs and bonds with the skin to continue killing germs even after washing.    Oral Hygiene is also important to reduce your risk of infection.  Remember - BRUSH YOUR TEETH THE MORNING OF SURGERY WITH YOUR REGULAR TOOTHPASTE  Please do not use if you have an allergy to CHG or antibacterial soaps. If your skin becomes reddened/irritated stop using the CHG.  Do not shave (including legs and underarms) for at least 48 hours prior to first CHG shower. It is OK to shave your face.  Please follow these instructions carefully.   1. Shower the NIGHT BEFORE SURGERY and the MORNING OF SURGERY with CHG Soap.   2. If you chose to wash your hair and body, wash as usual with your normal shampoo and body-wash/soap.  3. Rinse your hair and body thoroughly to remove the shampoo and soap.  4. Apply CHG directly to the skin (ONLY FROM THE NECK DOWN) and wash gently with a scrungie or a clean washcloth.   5. Do not use on open wounds or open sores. Avoid contact with your eyes, ears, mouth and genitals (private parts). Wash Face and genitals (private parts)  with your normal soap.   6. Wash thoroughly, paying special attention to the area where your surgery will be performed.  7. Thoroughly rinse your body with warm water from the neck down.  8. DO NOT shower/wash with your normal soap after using and rinsing off the CHG Soap.  9. Pat yourself dry with a CLEAN TOWEL.  10. Wear CLEAN PAJAMAS to bed the night before surgery  11. Place CLEAN SHEETS on your bed the night of your first shower and DO NOT SLEEP WITH  PETS.  12. Wear comfortable clothes the morning of surgery.     Day of Surgery:  Please shower the morning of surgery with the CHG soap Do not apply any deodorants/lotions. Please wear clean clothes to the hospital/surgery center.   Remember to brush your teeth WITH YOUR REGULAR TOOTHPASTE.   Please read over the following fact sheets that you were given.

## 2019-08-11 ENCOUNTER — Ambulatory Visit: Payer: Medicare Other | Attending: Physician Assistant | Admitting: Physical Therapy

## 2019-08-11 ENCOUNTER — Encounter (HOSPITAL_COMMUNITY)
Admission: RE | Admit: 2019-08-11 | Discharge: 2019-08-11 | Disposition: A | Discharge status: Home / Self Care | Payer: Medicare Other | Source: Ambulatory Visit | Attending: Cardiovascular Disease | Admitting: Cardiovascular Disease

## 2019-08-11 ENCOUNTER — Other Ambulatory Visit: Payer: Self-pay

## 2019-08-11 ENCOUNTER — Encounter: Payer: Self-pay | Admitting: Physical Therapy

## 2019-08-11 ENCOUNTER — Other Ambulatory Visit (HOSPITAL_COMMUNITY)
Admission: RE | Admit: 2019-08-11 | Discharge: 2019-08-11 | Disposition: A | Discharge status: Home / Self Care | Payer: Medicare Other | Source: Ambulatory Visit | Attending: Physician Assistant | Admitting: Physician Assistant

## 2019-08-11 ENCOUNTER — Ambulatory Visit (HOSPITAL_COMMUNITY)
Admission: RE | Admit: 2019-08-11 | Discharge: 2019-08-11 | Disposition: A | Discharge status: Home / Self Care | Payer: Medicare Other | Source: Ambulatory Visit | Attending: Cardiovascular Disease | Admitting: Cardiovascular Disease

## 2019-08-11 ENCOUNTER — Encounter (HOSPITAL_COMMUNITY): Payer: Self-pay

## 2019-08-11 DIAGNOSIS — R2689 Other abnormalities of gait and mobility: Secondary | ICD-10-CM | POA: Diagnosis not present

## 2019-08-11 DIAGNOSIS — Z01818 Encounter for other preprocedural examination: Secondary | ICD-10-CM | POA: Insufficient documentation

## 2019-08-11 DIAGNOSIS — R9431 Abnormal electrocardiogram [ECG] [EKG]: Secondary | ICD-10-CM | POA: Diagnosis not present

## 2019-08-11 DIAGNOSIS — I350 Nonrheumatic aortic (valve) stenosis: Secondary | ICD-10-CM | POA: Insufficient documentation

## 2019-08-11 DIAGNOSIS — Z20822 Contact with and (suspected) exposure to covid-19: Secondary | ICD-10-CM | POA: Insufficient documentation

## 2019-08-11 LAB — HEMOGLOBIN A1C
Hgb A1c MFr Bld: 7 % — ABNORMAL HIGH (ref 4.8–5.6)
Mean Plasma Glucose: 154.2 mg/dL

## 2019-08-11 LAB — URINALYSIS, ROUTINE W REFLEX MICROSCOPIC
Bacteria, UA: NONE SEEN
Bilirubin Urine: NEGATIVE
Glucose, UA: NEGATIVE mg/dL
Hgb urine dipstick: NEGATIVE
Ketones, ur: NEGATIVE mg/dL
Leukocytes,Ua: NEGATIVE
Nitrite: NEGATIVE
Protein, ur: 30 mg/dL — AB
Specific Gravity, Urine: 1.009 (ref 1.005–1.030)
pH: 6 (ref 5.0–8.0)

## 2019-08-11 LAB — COMPREHENSIVE METABOLIC PANEL
ALT: 20 U/L (ref 0–44)
AST: 21 U/L (ref 15–41)
Albumin: 3.8 g/dL (ref 3.5–5.0)
Alkaline Phosphatase: 36 U/L — ABNORMAL LOW (ref 38–126)
Anion gap: 12 (ref 5–15)
BUN: 21 mg/dL (ref 8–23)
CO2: 24 mmol/L (ref 22–32)
Calcium: 9.9 mg/dL (ref 8.9–10.3)
Chloride: 99 mmol/L (ref 98–111)
Creatinine, Ser: 1.18 mg/dL (ref 0.61–1.24)
GFR calc Af Amer: >60 mL/min (ref >60)
GFR calc non Af Amer: 59 mL/min — ABNORMAL LOW (ref >60)
Glucose, Bld: 180 mg/dL — ABNORMAL HIGH (ref 70–99)
Potassium: 3.6 mmol/L (ref 3.5–5.1)
Sodium: 135 mmol/L (ref 135–145)
Total Bilirubin: 0.7 mg/dL (ref 0.3–1.2)
Total Protein: 6.8 g/dL (ref 6.5–8.1)

## 2019-08-11 LAB — TYPE AND SCREEN
ABO/RH(D): O POS
Antibody Screen: NEGATIVE

## 2019-08-11 LAB — SURGICAL PCR SCREEN
MRSA, PCR: NEGATIVE
Staphylococcus aureus: NEGATIVE

## 2019-08-11 LAB — CBC
HCT: 42.7 % (ref 39.0–52.0)
Hemoglobin: 14.1 g/dL (ref 13.0–17.0)
MCH: 27.8 pg (ref 26.0–34.0)
MCHC: 33 g/dL (ref 30.0–36.0)
MCV: 84.2 fL (ref 80.0–100.0)
Platelets: 144 10*3/uL — ABNORMAL LOW (ref 150–400)
RBC: 5.07 MIL/uL (ref 4.22–5.81)
RDW: 13.2 % (ref 11.5–15.5)
WBC: 7.3 10*3/uL (ref 4.0–10.5)
nRBC: 0 % (ref 0.0–0.2)

## 2019-08-11 LAB — BLOOD GAS, ARTERIAL
Acid-Base Excess: 3.3 mmol/L — ABNORMAL HIGH (ref 0.0–2.0)
Bicarbonate: 27.2 mmol/L (ref 20.0–28.0)
FIO2: 21
O2 Saturation: 96.9 %
Patient temperature: 37
pCO2 arterial: 40.6 mmHg (ref 32.0–48.0)
pH, Arterial: 7.441 (ref 7.350–7.450)
pO2, Arterial: 87.4 mmHg (ref 83.0–108.0)

## 2019-08-11 LAB — PROTIME-INR
INR: 1.1 (ref 0.8–1.2)
Prothrombin Time: 13.5 seconds (ref 11.4–15.2)

## 2019-08-11 LAB — SARS CORONAVIRUS 2 (TAT 6-24 HRS): SARS Coronavirus 2: NEGATIVE

## 2019-08-11 LAB — APTT: aPTT: 41 seconds — ABNORMAL HIGH (ref 24–36)

## 2019-08-11 LAB — GLUCOSE, CAPILLARY: Glucose-Capillary: 191 mg/dL — ABNORMAL HIGH (ref 70–99)

## 2019-08-11 LAB — BRAIN NATRIURETIC PEPTIDE: B Natriuretic Peptide: 238.5 pg/mL — ABNORMAL HIGH (ref 0.0–100.0)

## 2019-08-11 LAB — ABO/RH: ABO/RH(D): O POS

## 2019-08-11 NOTE — Therapy (Signed)
[facility], [place], [address] Phone: [phone]   Fax:  [phone]  Physical Therapy Evaluation  Patient Details  Name: [facility] MRN: [id number] Date of Birth: 15-Oct-1940 Referring Provider (PT): Angelena Form PA-C    Encounter Date: 08/11/2019   PT End of Session - 08/11/19 0941    Visit Number 1    Number of Visits 1    Date for PT Re-Evaluation 08/11/19    PT Start Time 0935    PT Stop Time 1000    PT Time Calculation (min) 25 min    Activity Tolerance Patient tolerated treatment well    Behavior During Therapy [facility] for tasks assessed/performed           Past Medical History:  Diagnosis Date  . Anxiety   . CAD (coronary artery disease)   . Diabetes mellitus without complication ([place])   . Hyperlipidemia   . Hypertension   . PVC (premature ventricular contraction)   . Severe aortic stenosis   . Sinus bradycardia on ECG   . Sleep apnea   . 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 Wilburn Cornelia.  . Stroke ([place])   . TIA (transient ischemic attack)     Past Surgical History:  Procedure Laterality Date  . CATARACT EXTRACTION, BILATERAL  10/2015, and 11/2015   with adding  TORIC lenses bilaterally   . INTRAVASCULAR PRESSURE WIRE/FFR STUDY N/A 07/07/2019   Procedure: INTRAVASCULAR PRESSURE WIRE/FFR STUDY;  Surgeon: [name], MD;  Location: [facility];  Service: Cardiovascular;  Laterality: N/A;  . LEFT HEART CATH AND CORONARY ANGIOGRAPHY N/A 07/07/2019   Procedure: LEFT HEART CATH AND CORONARY ANGIOGRAPHY;  Surgeon: [name], MD;  Location: [facility];  Service: Cardiovascular;  Laterality: N/A;  . SQUAMOUS CELL CARCINOMA EXCISION     tongue    There were no vitals filed for this visit.    Subjective Assessment - 08/11/19 0939    Subjective Patient reports that for the past few years he has been becoming fatigued with  ambualtion.    Pertinent History DMII, Anxiety, Bradycardia,    Currently in Pain? No/denies              [facility] PT Assessment - 08/11/19 0001      Assessment   Medical Diagnosis Severe Aortic Stenosis     Referring Provider (PT) Angelena Form PA-C     Next MD Visit today for covid testing     Prior Therapy none       Precautions   Precautions None      Restrictions   Weight Bearing Restrictions No      Balance Screen   Has the patient fallen in the past 6 months No    Has the patient had a decrease in activity level because of a fear of falling?  No    Is the patient reluctant to leave their home because of a fear of falling?  No      Prior Function   Level of Independence Independent      Cognition   Overall Cognitive Status Within Functional Limits for tasks assessed    Area of Impairment --    Attention Focused    Focused Attention Appears intact    Memory Appears intact    Awareness Appears intact    Problem Solving Appears intact      Observation/Other Assessments   Focus  on Therapeutic Outcomes (FOTO)  1x visit       Sensation   Light Touch Appears Intact      Coordination   Gross Motor Movements are Fluid and Coordinated Yes    Fine Motor Movements are Fluid and Coordinated Yes      ROM / Strength   AROM / PROM / Strength AROM;PROM;Strength      AROM   Overall AROM Comments full active ROM       Strength   Overall Strength Comments 5/5 gross UE /LE     Strength Assessment Site Hand    Right/Left hand Right;Left    Right Hand Grip (lbs) 60    Left Hand Grip (lbs) 60            [facility] Pre-Surgical Assessment - 08/11/19 0001    5 Meter Walk Test- trial 1 8 sec    5 Meter Walk Test- trial 2 8 sec.     5 Meter Walk Test- trial 3 7 sec.    5 meter walk test average 7.67 sec    4 Stage Balance Test tolerated for:  10 sec.    4 Stage Balance Test Position 4    Sit To Stand Test- trial 1 5 sec.    Comment 15 sec     ADL/IADL Independent with:  Bathing;Dressing;Meal prep;Finances;Yard work    6 Minute Walk- Baseline yes    BP (mmHg) 151/61    HR (bpm) 58    02 Sat (%RA) 96 %    Modified Borg Scale for Dyspnea 0- Nothing at all    Perceived Rate of Exertion (Borg) 6-    6 Minute Walk Post Test yes    BP (mmHg) 153/61    HR (bpm) 69    02 Sat (%RA) 95 %    Modified Borg Scale for Dyspnea 2- Mild shortness of breath    Perceived Rate of Exertion (Borg) 13- Somewhat hard    Aerobic Endurance Distance Walked 800    Endurance additional comments 1 seated rest bberak of 55 seconds 53%                     Objective measurements completed on examination: See above findings.               PT Education - 08/11/19 1150    Education Details purpose of testing    Person(s) Educated Patient    Methods Explanation;Demonstration;Tactile cues;Verbal cues    Comprehension Returned demonstration;Verbalized understanding;Verbal cues required;Tactile cues required                       Plan - 08/11/19 1151    Clinical Impression Statement see below    Stability/Clinical Decision Making Stable/Uncomplicated    Clinical Decision Making Low    Rehab Potential Good    PT Frequency One time visit    PT Treatment/Interventions Patient/family education           Clinical Impression Statement: Pt is a 79 yo male presenting to OP PT for evaluation prior to possible TAVR surgery due to severe aortic stenosis. Pt reports onset of fatigue with gait and daily activity approximately 24 months ago. Symptoms are limiting ability to perform ADL's. Pt presents with normal ROM and strength, normal balance and is not high fall risk 4 stage balance test, decreased walking speed and decreased aerobic endurance per 6 minute walk test. Pt ambulated 740 feet in 4:37 before requesting  a seated rest beak lasting 55 sec. At time of rest, patient's HR was 69 bpm and O2 was 95 on room air. Pt reported 2/10 shortness of breath on  modified scale for dyspnea. Pt able to resume after rest and ambulate an additional 60 feet. Pt ambulated a total of 800 feet in 6 minute walk. B/P did not increase significantly with 6 minute walk test but was high per patient upon baseline testing. Based on the Short Physical Performance Battery, patient has a frailty rating of 9/12 with </= 5/12 considered frail.   Patient will benefit from skilled therapeutic intervention in order to improve the following deficits and impairments:     Visit Diagnosis: Other abnormalities of gait and mobility  .   Problem List Patient Active Problem List   Diagnosis Date Noted  . Abnormal cardiac CT angiography 07/07/2019  . Coronary artery disease involving native coronary artery of native heart with angina pectoris ([place]) 07/04/2019  . Benign fibroma of prostate 06/05/2013  . 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 Excessive urination at night 06/05/2013  . TIA (transient ischemic attack) 07/27/2012  . 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 Carotid artery disease ([place]) 08/24/2011  . Aortic stenosis 02/02/2011  . PVC (premature ventricular contraction)   . Hypertension   . Hyperlipidemia   . Anxiety   . Sinus bradycardia on ECG     Carney Living  PT DPT  08/11/2019, 11:54 AM  [facility] [address] [place], [place], [address] Phone: [phone]   Fax:  [phone]  Name: AJ CRUNKLETON MRN: [id number] Date of Birth: [birth year]

## 2019-08-11 NOTE — Progress Notes (Signed)
PCP:  Domenick Gong, MD Cardiologist:  Cleatrice Burke, MD  EKG: 08/11/19 CXR:  08/11/19 ECHO:  07/17/19 Stress Test: Denies Cardiac Cath:  07/07/19  Fasting Blood Sugar- 100-200 Checks Blood Sugar__3-4_ times a day  Covid test 08/11/19  Anesthesia Review:  Yes, cardiac history.  Pt to call MD today to ask about Aggronox instructions.  Patient denies shortness of breath, fever, cough, and chest pain at PAT appointment.  Patient verbalized understanding of instructions provided today at the PAT appointment.  Patient asked to review instructions at home and day of surgery.

## 2019-08-14 MED ORDER — SODIUM CHLORIDE 0.9 % IV SOLN
INTRAVENOUS | Status: DC
  Filled 2019-08-14: qty 30

## 2019-08-14 MED ORDER — LEVOFLOXACIN IN D5W 500 MG/100ML IV SOLN
500.0000 mg | INTRAVENOUS | Status: AC
  Administered 2019-08-15: 500 mg via INTRAVENOUS
  Filled 2019-08-14: qty 100

## 2019-08-14 MED ORDER — MAGNESIUM SULFATE 50 % IJ SOLN
40.0000 meq | INTRAMUSCULAR | Status: DC
  Filled 2019-08-14: qty 9.85

## 2019-08-14 MED ORDER — POTASSIUM CHLORIDE 2 MEQ/ML IV SOLN
80.0000 meq | INTRAVENOUS | Status: DC
  Filled 2019-08-14: qty 40

## 2019-08-14 MED ORDER — VANCOMYCIN HCL 1500 MG/300ML IV SOLN
1500.0000 mg | INTRAVENOUS | Status: AC
  Administered 2019-08-15: 1500 mg via INTRAVENOUS
  Filled 2019-08-14: qty 300

## 2019-08-14 MED ORDER — DEXMEDETOMIDINE HCL IN NACL 400 MCG/100ML IV SOLN
0.1000 ug/kg/h | INTRAVENOUS | Status: AC
  Administered 2019-08-15: 1 ug/kg/h via INTRAVENOUS
  Filled 2019-08-14: qty 100

## 2019-08-14 MED ORDER — NOREPINEPHRINE 4 MG/250ML-% IV SOLN
0.0000 ug/min | INTRAVENOUS | Status: DC
  Filled 2019-08-14: qty 250

## 2019-08-14 NOTE — H&P (Signed)
[address]       [place] [address]             [phone]      Cardiothoracic Surgery Admission History and Physical   Referring Provider is Nahser, Wonda Cheng, MD  Primary Cardiologist is Mertie Moores, MD  PCP is Tisovec, Fransico Him, MD      Chief Complaint  Patient presents with  . Aortic Stenosis       HPI:  The patient is a 79 year old gentleman with a history of hypertension, hyperlipidemia, type 2 diabetes, sleep apnea, moderate bilateral carotid artery disease, TIA, and aortic stenosis. He had a 2D echocardiogram on 04/25/2018 which showed a mean gradient across aortic valve of 29 mmHg with a peak gradient of 54 mmHg. Valve area was 0.79 cm. Over the past few months he has been complaining of occasional vague chest discomfort that is not necessarily exertional related. He underwent a gated coronary CT which showed a calcium score of 2633. There was severe multivessel calcified plaque. The FFR was 0.90 in the proximal LAD, 0.78 in the mid LAD, and 0.68 in the distal LAD. The FFR was 0.87 in the proximal left circumflex, 0.71 in the mid left circumflex, and 0.57 in the distal left circumflex. The right coronary was occluded proximally. He subsequently underwent cardiac catheterization on 07/07/2019. This showed 70% stenosis in the proximal to mid LAD. The distal LAD had 75% stenosis. The left circumflex had 75% proximal and mid stenosis. The right coronary was occluded at its origin with filling of the dominant distal vessel by collaterals from the left. Left ventricular ejection fraction was 55 to 65%. The mean gradient measured across the aortic valve was 37 mmHg. He underwent a repeat echocardiogram yesterday which showed a mean gradient across aortic valve of 43 mmHg with a peak gradient of 62 mmHg. Aortic valve area was 0.68 cm. There was mild aortic insufficiency. Left ventricular ejection fraction was 55 to 60%. He subsequently underwent CT scan work-up for  consideration of TAVR which incidentally showed a 7.2 x 6.1 x 8.3 cm heterogeneous enhancing left renal mass suspicious for primary renal cell carcinoma. There are also several small pulmonary nodules throughout the lungs bilaterally with the largest in the right lower lobe measuring 1.3 x 0.9 cm. The PET scan showed a hypermetabolic exophytic 7 cm renal cortical mass with a maximum SUV of 8.1 that is most likely a renal cell carcinoma.  There is no other abnormal hypermetabolic activity within the abdomen.  There was anal hypermetabolism which is most likely due to muscle activity.  The 1.2 cm dominant right lower lobe pulmonary nodule was not hypermetabolic.  There were a few additional small solid pulmonary nodules scattered in both lungs measuring up to 0.5 cm in the left upper lobe are all below PET resolution.  The patient is here today with his wife. He says that he does not exercise or get out to walk that much but stays active doing things around the house and with his family. He denies any shortness of breath. Does report some exertional fatigue. He denies any dizziness or syncope. Has had no peripheral edema. He has infrequent episodes of pain across his chest which is not necessarily related to exertion and short-lived. He said that it does not feel like something sitting on his chest.      Past Medical History:  Diagnosis Date  . Anxiety   . CAD (coronary artery disease)   .  Diabetes mellitus without complication ([place])   . Hyperlipidemia   . Hypertension   . PVC (premature ventricular contraction)   . Severe aortic stenosis   . Sinus bradycardia on ECG   . Sleep apnea   . 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 Wilburn Cornelia.  . Stroke ([place])   . TIA (transient ischemic attack)         Past Surgical History:  Procedure Laterality Date  . CATARACT EXTRACTION, BILATERAL  10/2015, and 11/2015   with adding TORIC lenses bilaterally   . INTRAVASCULAR  PRESSURE WIRE/FFR STUDY N/A 07/07/2019   Procedure: INTRAVASCULAR PRESSURE WIRE/FFR STUDY; Surgeon: [name], MD; Location: [place] [facility]; Service: Cardiovascular; Laterality: N/A;  . LEFT HEART CATH AND CORONARY ANGIOGRAPHY N/A 07/07/2019   Procedure: LEFT HEART CATH AND CORONARY ANGIOGRAPHY; Surgeon: [name], MD; Location: [place] [facility]; Service: Cardiovascular; Laterality: N/A;  . SQUAMOUS CELL CARCINOMA EXCISION     tongue        Family History  Problem Relation Age of Onset  . Heart disease Mother   . Heart attack Mother   . Heart disease Father   . Heart attack Father    Social History        Socioeconomic History  . Marital status: Married    Spouse name: Not on file  . Number of children: 2  . Years of education: Not on file  . Highest education level: Not on file  Occupational History  . Not on file  Tobacco Use  . Smoking status: Former Smoker    Quit date: 06/25/1975    Years since quitting: 44.0  . Smokeless tobacco: Never Used  Substance and Sexual Activity  . Alcohol use: No  . Drug use: No  . Sexual activity: Yes  Other Topics Concern  . Not on file  Social History Narrative   Patient lives at home with your wife.          Current Outpatient Medications  Medication Sig Dispense Refill  . acetaminophen (TYLENOL) 500 MG tablet Take 1,000 mg by mouth every 6 (six) hours as needed for moderate pain.    Marland Kitchen bismuth subsalicylate (PEPTO BISMOL) 262 MG/15ML suspension Take 30 mLs by mouth every 6 (six) hours as needed for indigestion or diarrhea or loose stools.    . Choline Fenofibrate (TRILIPIX) 135 MG capsule Take 135 mg by mouth daily.     . clonazePAM (KLONOPIN) 0.5 MG tablet Take 0.5 mg by mouth 4 (four) times daily.     . Coenzyme Q10 200 MG capsule Take 200 mg by mouth daily.    Marland Kitchen dipyridamole-aspirin (AGGRENOX) 200-25 MG 12hr capsule Take 1 capsule by mouth 2 (two) times daily. 60 capsule 0  . doxazosin (CARDURA) 4 MG tablet Take 4  mg by mouth at bedtime.     Marland Kitchen glucose blood test strip 1 each by Other route as needed.    . isosorbide mononitrate (IMDUR) 30 MG 24 hr tablet Take 1 tablet (30 mg total) by mouth daily. 30 tablet 11  . Lancets (ONETOUCH DELICA PLUS [id number]) MISC     . LANTUS SOLOSTAR 100 UNIT/ML Solostar Pen Inject 65 Units as directed daily.     Marland Kitchen losartan-hydrochlorothiazide (HYZAAR) 100-25 MG tablet Take 1 tablet by mouth daily. 90 tablet 3  . metoprolol tartrate (LOPRESSOR) 50 MG tablet TAKE 1 TABLET BY MOUTH 3 TIMES A DAY. (Patient taking differently: Take 50 mg by  mouth 3 (three) times daily. ) 270 tablet 3  . ONE TOUCH ULTRA TEST test strip     . rosuvastatin (CRESTOR) 5 MG tablet Take 1 tablet (5 mg total) by mouth daily. 90 tablet 3  . sitaGLIPtin (JANUVIA) 100 MG tablet Take 1 tablet (100 mg total) by mouth daily. 5 tablet 0   No current facility-administered medications for this visit.        Allergies  Allergen Reactions  . Macrodantin [Nitrofurantoin] Anaphylaxis    blocked kidneys   . Sulfa Antibiotics Rash  . Lisinopril Cough  . Penicillins     Unknown   Review of Systems:  General: normal appetite, + decreased energy, no weight gain, no weight loss, no fever  Cardiac: + occasional chest pain with exertion, + occasional chest pain at rest, no SOB with exertion, no resting SOB, no PND, no orthopnea, + palpitations, no arrhythmia, no atrial fibrillation, no LE edema, no dizzy spells, no syncope  Respiratory: no shortness of breath, no home oxygen, no productive cough, no dry cough, no bronchitis, no wheezing, no hemoptysis, no asthma, no pain with inspiration or cough, no sleep apnea, no CPAP at night  GI: no difficulty swallowing, no reflux, no frequent heartburn, no hiatal hernia, no abdominal pain, no constipation, no diarrhea, no hematochezia, no hematemesis, no melena  GU: no dysuria, no frequency, no urinary tract infection, no hematuria, no enlarged prostate, no kidney stones, no  kidney disease  Vascular: no pain suggestive of claudication, no pain in feet, no leg cramps, no varicose veins, no DVT, no non-healing foot ulcer  Neuro: no stroke, + TIA's, no seizures, no headaches, no temporary blindness one eye, no slurred speech, no peripheral neuropathy, no chronic pain, no instability of gait, no memory/cognitive dysfunction  Musculoskeletal: no arthritis, no joint swelling, no myalgias, no difficulty walking, normal mobility  Skin: no rash, no itching, no skin infections, no pressure sores or ulcerations  Psych: + anxiety, no depression, no nervousness, + unusual recent stress  Eyes: no blurry vision, no floaters, no recent vision changes, + wears glasses or contacts  ENT: no hearing loss, no loose or painful teeth, no dentures, last saw dentist past year  Hematologic: no easy bruising, no abnormal bleeding, no clotting disorder, no frequent epistaxis  Endocrine: + diabetes, does check CBG's at home  Physical Exam:  BP (!) 160/68  Pulse (!) 59  Temp 97.6 F (36.4 C) (Skin)  Resp 20  Ht '5\' 10"'$  (1.778 m)  Wt 205 lb (93 kg)  SpO2 97% Comment: RA  BMI 29.41 kg/m  General: Elderly but well-appearing  HEENT: Unremarkable, NCAT, PERLA, EOMI  Neck: no JVD, + bruit bilaterally , no adenopathy  Chest: clear to auscultation, symmetrical breath sounds, no wheezes, no rhonchi  CV: RRR, grade lll/VI crescendo/decrescendo murmur heard best at RSB, no diastolic murmur  Abdomen: soft, non-tender, no masses  Extremities: warm, well-perfused, pulses palpable at ankle, no LE edema  Rectal/GU Deferred  Neuro: Grossly non-focal and symmetrical throughout  Skin: Clean and dry, no rashes, no breakdown  Diagnostic Tests:  ECHOCARDIOGRAM REPORT     Patient Name: TONIE VIZCARRONDO Date of Exam: 07/17/2019  Medical Rec #: [id number] Height: 70.0 in  Accession #: [id number] Weight: 205.0 lb  Date of Birth: 09/25/1940 BSA: 2.109 m  Patient Age: 66 years BP: 126/80 mmHg  Patient  Gender: M HR: 62 bpm.  Exam Location: [address]   Procedure: 2D Echo, Cardiac Doppler and Color Doppler  Indications: I35.0 Aortic Stenosis   History: Patient has prior history of Echocardiogram examinations,  most  recent 04/25/2018. Risk Factors:Hypertension and  Dyslipidemia.  PVC's. TIA.   Sonographer: Cresenciano Lick RDCS  Referring Phys: [id number] [place]    1. Normal LV systolic function; grade 1 diastolic dysfunction; mild LVH;  calcified aortic valve with severe AS (mean gradient 43 mmHg) and mild AI;  mild MR.  2. Left ventricular ejection fraction, by estimation, is 55 to 60%. The  left ventricle has normal function. The left ventricle has no regional  wall motion abnormalities. There is mild left ventricular hypertrophy.  Left ventricular diastolic parameters  are consistent with Grade I diastolic dysfunction (impaired relaxation).  3. Right ventricular systolic function is normal. The right ventricular  size is normal.  4. The mitral valve is normal in structure. Mild mitral valve  regurgitation. No evidence of mitral stenosis.  5. The aortic valve is normal in structure. Aortic valve regurgitation is  mild. Severe aortic valve stenosis.  6. The inferior vena cava is normal in size with greater than 50%  respiratory variability, suggesting right atrial pressure of 3 mmHg.   FINDINGS  Left Ventricle: Left ventricular ejection fraction, by estimation, is 55  to 60%. The left ventricle has normal function. The left ventricle has no  regional wall motion abnormalities. The left ventricular internal cavity  size was normal in size. There is  mild left ventricular hypertrophy. Left ventricular diastolic parameters  are consistent with Grade I diastolic dysfunction (impaired relaxation).   Right Ventricle: The right ventricular size is normal. Right ventricular  systolic function is normal.   Left Atrium: Left atrial size was  normal in size.   Right Atrium: Right atrial size was normal in size.   Pericardium: There is no evidence of pericardial effusion.   Mitral Valve: The mitral valve is normal in structure. Normal mobility of  the mitral valve leaflets. Mild mitral valve regurgitation. No evidence of  mitral valve stenosis.   Tricuspid Valve: The tricuspid valve is normal in structure. Tricuspid  valve regurgitation is trivial. No evidence of tricuspid stenosis.   Aortic Valve: The aortic valve is normal in structure. Aortic valve  regurgitation is mild. Aortic regurgitation PHT measures 675 msec. Severe  aortic stenosis is present. Aortic valve mean gradient measures 43.0 mmHg.  Aortic valve peak gradient measures  61.8 mmHg. Aortic valve area, by VTI measures 0.68 cm.   Pulmonic Valve: The pulmonic valve was normal in structure. Pulmonic valve  regurgitation is trivial. No evidence of pulmonic stenosis.   Aorta: The aortic root is normal in size and structure.   Venous: The inferior vena cava is normal in size with greater than 50%  respiratory variability, suggesting right atrial pressure of 3 mmHg.   IAS/Shunts: No atrial level shunt detected by color flow Doppler.   Additional Comments: Normal LV systolic function; grade 1 diastolic  dysfunction; mild LVH; calcified aortic valve with severe AS (mean  gradient 43 mmHg) and mild AI; mild MR.    LEFT VENTRICLE  PLAX 2D  LVIDd: 4.80 cm Diastology  LVIDs: 3.20 cm LV e' lateral: 6.31 cm/s  LV PW: 1.20 cm LV E/e' lateral: 13.1  LV IVS: 1.20 cm LV e' medial: 5.44 cm/s  LVOT diam: 2.10 cm LV E/e' medial: 15.2  LV SV: 75  LV SV Index: 36  LVOT Area: 3.46 cm    RIGHT VENTRICLE  RV Basal diam: 3.60 cm  RV  S prime: 16.50 cm/s  TAPSE (M-mode): 2.5 cm   LEFT ATRIUM Index RIGHT ATRIUM Index  LA diam: 4.50 cm 2.13 cm/m RA Area: 13.50 cm  LA Vol (A2C): 69.1 ml 32.76 ml/m RA Volume: 36.40 ml 17.26 ml/m  LA Vol (A4C): 51.0 ml 24.18 ml/m    LA Biplane Vol: 63.6 ml 30.15 ml/m  AORTIC VALVE  AV Area (Vmax): 0.67 cm  AV Area (Vmean): 0.64 cm  AV Area (VTI): 0.68 cm  AV Vmax: 393.20 cm/s  AV Vmean: 303.400 cm/s  AV VTI: 1.107 m  AV Peak Grad: 61.8 mmHg  AV Mean Grad: 43.0 mmHg  LVOT Vmax: 76.10 cm/s  LVOT Vmean: 56.350 cm/s  LVOT VTI: 0.218 m  LVOT/AV VTI ratio: 0.20  AI PHT: 675 msec   AORTA  Ao Root diam: 3.50 cm  Ao Asc diam: 3.50 cm   MITRAL VALVE  MV Area (PHT): 2.32 cm SHUNTS  MV Decel Time: 327 msec Systemic VTI: 0.22 m  MV E velocity: 82.90 cm/s Systemic Diam: 2.10 cm  MV A velocity: 84.40 cm/s  MV E/A ratio: 0.98   Kirk Ruths MD  Electronically signed by Kirk Ruths MD  Signature Date/Time: 07/18/2019/7:19:05 AM  Physicians  Panel Physicians Referring Physician Case Authorizing Physician  [name], MD (Primary)       Procedures  INTRAVASCULAR PRESSURE WIRE/FFR STUDY  LEFT HEART CATH AND CORONARY ANGIOGRAPHY     Conclusion  Ost LM lesion is 30% stenosed.  Prox LAD to Mid LAD lesion is 70% stenosed with 70% stenosed side branch in 2nd Diag. Both vessels positive by CT FFR  Mid LAD to Dist LAD lesion is 40% stenosed. Dist LAD lesion is 75% stenosed.  Prox Cx lesion is 45% stenosed. Prox Cx to Mid Cx lesion is 75% stenosed. RFR positive is 0.87  Mid Cx lesion is 5% stenosed with 50% stenosed side branch in 3rd Mrg. No significant drop in RFR reading beyond this lesion.  Ost RCA to Dist RCA lesion is 100% stenosed. Significant left to right collaterals fill the PDA and PL system.  RPDA lesion is 50% stenosed.  --------------------------------------  The left ventricular systolic function is normal. The left ventricular ejection fraction is 55-65% by visual estimate. Regional wall motion knowledged and inferior wall.  LV end diastolic pressure is moderately elevated.  There is ~SEVERE AORTIC VALVE STENOSIS. SUMMARY  Severe three-vessel disease: 100% CTO of ostial RCA (left to  right collaterals fill PDA and PL), long 70% calcified proximal to mid LAD (positive by CT FFR), segmental ~75% proximal-mid LCx (positive by RFR)  Borderline SEVERE AORTIC STENOSIS-mean gradient estimated 37 mmHg by pullback.  Normal LVEF with basal inferior hypokinesis seen on echocardiogram and LV gram. Plan will be to consult CVTS/Cardiology Valve Clinic to consider options between two-vessel PCI and TAVR versus CABG/AVR.  He is not having resting chest pain, will discharge home today on Imdur pending Valve Clinic evaluation.Glenetta Hew, MD      Recommendations  Antiplatelet/Anticoag Recommend Aspirin '81mg'$  daily for moderate CAD. He is on Aggrenox - will need to determine treatment with this. We will add Imdur 30 mg daily since he is having intermittent chest pain   Discharge Date In the absence of any other complications or medical issues, we expect the patient to be ready for discharge from a cath perspective on 07/07/2019.   Glenetta Hew, MD        Indications  Progressive angina ([place]) [I20.0 (ICD-10-CM)]  Abnormal cardiac  CT angiography [R93.1 (ICD-10-CM)]  Coronary artery disease involving native coronary artery of native heart with angina pectoris with documented spasm ([place]) [C94.709 (ICD-10-CM)]     Procedural Details  Technical Details Primary CARE Physician: Dr. Domenick Gong Cardiologist: Dr. Mertie Moores  GWYN MEHRING is a very pleasant 79 year old gentleman with hypertension hyperlipidemia as well as prior strokes and moderate aortic stenosis by echocardiogram with normal preserved EF who was recently seen in April by Dr. Acie Fredrickson for worsening chest pain and exertional dyspnea symptoms. He is been having intermittent symptoms for the last month or so. He is evaluated with a coronary CT angiogram which revealed severe multivessel disease and likely occluded RCA. He is now referred for invasive evaluation with cardiac catheterization and possible PCI. Of note, mean  gradient on his echocardiogram his valve was 27 mmHg.  Time Out: Verified patient identification, verified procedure, site/side was marked, verified correct patient position, special equipment/implants available, medications/allergies/relevent history reviewed, required imaging and test results available. Performed.  Access:  RIGHT Radial Artery: 6 Fr sheath -- Seldinger technique using Micropuncture Kit -- Direct ultrasound guidance used. Permanent image obtained and placed on chart. -- 10 mL radial cocktail IA; 5000 Units IV Heparin  Left Heart Catheterization: 5Fr Catheters advanced or exchanged over a J-wire under direct fluoroscopic guidance into the ascending aorta; TIG 4.0 catheter advanced first.  * LV Hemodynamics (LV Gram): TIG 4.0 Catheter * Left Coronary Artery Cineangiography: TIG 4.0 Catheter & EBU 3.5 guide catheter * Right Coronary Artery Cineangiography: TIG 4.0 catheter   Review of initial angiography revealed: Severe CAD similar to what was seen in the CT angiogram. The RCA is totally occluded with risk left to right collaterals. LAD has pretty significant proximal disease at the diagonal branches. The LCx has questionable lesion in the mid section that was read as positive by CT FFR. Also noted significantly increased aortic gradients of 37.2 was supposed to 27 on echocardiogram suggesting probably more severe aortic stenosis.  Preparations are made for RFR/DFR measurement of LCx to confirm CT angiogram findings. -> Additional 5000 units heparin administered --> With the EBU guide catheter in place, the takeoff of the LCx was very close to the left main therefore a Prowater buddy wire was advanced down the circumflex to allow for guide seating to be pulled back and allow for the pressure wire (Guidewire Pressure X 175) to be advanced. Into the LCx. --> DFR recordings were obtained in to the distal LCx-OM 3 with pullbacks recorded along the way. This showed physiologically  significant lesion prior to the bifurcation into OM to OM 3.  Upon completion of Angiogaphy, the catheter was removed completely out of the body over a wire, without complication.  Radial sheath removed in the Cardiac Catheterization lab with TR Band placed for hemostasis.  TR Band: 1145 Hours; 14 mL air  MEDICATIONS SQ Lidocaine 3 mL Radial Cocktail: 3 mg Verapmil in 10 mL NS Heparin: Total 10,000 units Estimated blood loss <50 mL.   During this procedure medications were administered to achieve and maintain moderate conscious sedation while the patient's heart rate, blood pressure, and oxygen saturation were continuously monitored and I was present face-to-face 100% of this time.     Medications  (Filter: Administrations occurring from 07/07/19 0953 to 07/07/19 1203)  midazolam (VERSED) injection (mg)  Total dose: 2 mg  Date/Time   Rate/Dose/Volume Action  07/07/19 1018  1 mg Given  1110  1 mg Given  fentaNYL (SUBLIMAZE) injection (mcg)  Total  dose: 75 mcg  Date/Time   Rate/Dose/Volume Action  07/07/19 1019  25 mcg Given  1105  25 mcg Given  1110  25 mcg Given  Radial Cocktail/Verapamil only  Total dose: Cannot be calculated*  *Administration dose not documented  Date/Time   Rate/Dose/Volume Action  07/07/19 1029   Given  heparin sodium (porcine) injection (Units)  Total dose: 5,000 Units  Date/Time   Rate/Dose/Volume Action  07/07/19 1039  5,000 Units Given  lidocaine (PF) (XYLOCAINE) 1 % injection (mL)  Total volume: 2 mL  Date/Time   Rate/Dose/Volume Action  07/07/19 1027  2 mL Given  iohexol (OMNIPAQUE) 350 MG/ML injection (mL)  Total volume: 150 mL  Date/Time   Rate/Dose/Volume Action  07/07/19 1150  150 mL Given  Heparin (Porcine) in NaCl 1000-0.9 UT/500ML-% SOLN (mL)  Total volume: 1,000 mL  Date/Time   Rate/Dose/Volume Action  07/07/19 1200  500 mL Given  1201  500 mL Given     Sedation Time  Sedation Time Physician-1: 1 hour 24 minutes 34  seconds        Contrast  Medication Name Total Dose  iohexol (OMNIPAQUE) 350 MG/ML injection 150 mL     Radiation/Fluoro  Fluoro time: 28.7 (min)  DAP: 19379, 02409 (mGycm2)  Cumulative Air Kerma: 735, 329 (mGy)     Complications  Complications documented before study signed (07/07/2019 92:42 PM)  No complications were associated with this study.  Documented by [name], MD - 07/07/2019 12:15 PM     Coronary Findings  Diagnostic  Dominance: Right  Left Main  Vessel was injected. Vessel is large. Very short LEFT MAIN  Ost LM lesion 30% stenosed  Ost LM lesion is 30% stenosed. The lesion is eccentric. The lesion is calcified.   Left Anterior Descending  Prox LAD to Mid LAD lesion 70% stenosed with side branch in 2nd Diag 70% stenosed  Prox LAD to Mid LAD lesion is 70% stenosed with 70% stenosed side branch in 2nd Diag. The lesion is located at the bend, segmental and eccentric. The lesion is moderately calcified. This segment was considered significant by CT FFR (0.76) and is angiographically significant.  Mid LAD to Dist LAD lesion 40% stenosed  Mid LAD to Dist LAD lesion is 40% stenosed.  Dist LAD lesion 75% stenosed  Dist LAD lesion is 75% stenosed. The lesion is focal. Apical   First Diagonal Branch  Vessel is small in size.   First Septal Branch  Vessel is small in size.   Second Diagonal Branch  Vessel is moderate in size.   Second Septal Branch  Vessel is small in size.   Third Septal Branch  Vessel is small in size.   Left Circumflex  Vessel is large. The distal circumflex courses is a 2nd Mrg  Prox Cx lesion 45% stenosed  Prox Cx lesion is 45% stenosed. The lesion is focal and eccentric. The lesion is mildly calcified.  Prox Cx to Mid Cx lesion 75% stenosed  Prox Cx to Mid Cx lesion is 75% stenosed. The lesion is located proximal to the major branch, segmental, eccentric and irregular. Pressure wire/FFR was performed on the lesion. RFR as follows:  OM3 =0.84, Ostial OM3 = 0.86, pre OM3 = 0.87; pre OM1 = 0.93 --> this indicates that the lesion becomes significant in the segment and does not require the ostial OM 3 lesion.  Mid Cx lesion 5% stenosed with side branch in 3rd Mrg 50% stenosed  Mid Cx lesion is 5% stenosed  with 50% stenosed side branch in 3rd Mrg. The lesion is located at the bifurcation and focal. Just after lesion RFR 0.86 & mid branch 0.84   First Obtuse Marginal Branch  Vessel is small in size.   Third Obtuse Marginal Branch  Vessel is moderate in size.   Left Posterior Atrioventricular Artery  Vessel is small in size.   Right Coronary Artery  Ost RCA to Dist RCA lesion 100% stenosed  Ost RCA to Dist RCA lesion is 100% stenosed. The lesion is chronically occluded with left-to-left collateral flow.   Right Posterior Descending Artery  The vessel exhibits minimal luminal irregularities.  Collaterals  RPDA filled by collaterals from 2nd Sept.    Collaterals  RPDA filled by collaterals from 3rd Sept.    RPDA lesion 50% stenosed  RPDA lesion is 50% stenosed. The lesion is distal to major branch and focal.   Right Posterior Atrioventricular Artery  Vessel is moderate in size. The vessel exhibits minimal luminal irregularities.   First Right Posterolateral Branch  Collaterals  1st RPL filled by collaterals from Dist Cx.     Second Right Posterolateral Branch  Vessel is small in size.  Collaterals  2nd RPL filled by collaterals from 3rd Mrg.    Intervention  No interventions have been documented.                      Wall Motion         Resting               The mid anterior, basilar anterior, apical anterior and apical inferior segments are normal. The mid inferior and basilar inferior segments are hypokinetic.                       Left Heart  Left Ventricle The left ventricular size is normal. The left ventricular systolic function is normal. LV end diastolic pressure is moderately  elevated. The left ventricular ejection fraction is 55-65% by visual estimate. There are LV function abnormalities due to segmental dysfunction.   Aortic Valve There is severe aortic valve stenosis. Borderline Severe --> Mean gradient 37.2 Hg; P-P 46 mmHg; suspect that is more severe than 2D Echo There is restricted aortic valve motion.     Coronary Diagrams  Diagnostic  Dominance: Right  &&&&&  Intervention       Implants  No implant documentation for this case.      [employer] Images Link to Procedure Log  Show images for CARDIAC CATHETERIZATION Procedure Log     Images on Long Term Storage   Show images for [place], [place]            [facility] Data    Most Recent Value  Aortic Mean Gradient 37.15 mmHg  Aortic Peak Gradient 46 mmHg  AO Systolic Pressure 132 mmHg  AO Diastolic Pressure 47 mmHg  AO Mean 76 mmHg  LV Systolic Pressure 178 mmHg  LV Diastolic Pressure 4 mmHg  LV EDP 19 mmHg  AOp Systolic Pressure 132 mmHg  AOp Diastolic Pressure 49 mmHg  AOp Mean Pressure 81 mmHg  LVp Systolic Pressure 173 mmHg  LVp Diastolic Pressure 11 mmHg  LVp EDP Pressure 20 mmHg   ADDENDUM REPORT: 07/14/2019 13:19  CLINICAL DATA: Aortic stenosis  EXAM:  Cardiac TAVR CT  TECHNIQUE:  The patient was scanned on a Siemens Force 192 slice scanner. A 120  kV retrospective scan was triggered in the descending thoracic  aorta  at 111 HU's. Gantry rotation speed was 270 msecs and collimation was  .9 mm. No beta blockade or nitro were given. The 3D data set was  reconstructed in 5% intervals of the R-R cycle. Systolic and  diastolic phases were analyzed on a dedicated work station using  MPR, MIP and VRT modes. The patient received 80 cc of contrast.  FINDINGS:  Aortic Valve: Calcified tri leaflet with restricted leaflet motion  Aorta: Normal arch vessels no aneurysm no coarctation  Sinotubular Junction: 24 mm heavily calcified  Ascending Thoracic Aorta: 29 mm  Aortic Arch: 26 mm    Descending Thoracic Aorta: 23 mm  Sinus of Valsalva Measurements:  Non-coronary: 31 mm  Right - coronary: 28 mm  Left - coronary: 29.2 mm  Coronary Artery Height above Annulus:  Left Main: 13.4 mm above annulus  Right Coronary: 14.7 mm above annulus but occluded  Virtual Basal Annulus Measurements:  Maximum/Minimum Diameter: 19.5 mm x 28.9 mm  Perimeter: 81 mm  Area: 512 mm2  Coronary Arteries: Sufficient height above annulus for deployment  RCA occluded fills by collaterals from left system  Optimum Fluoroscopic Angle for Delivery: LAO 3 Caudal 3 degrees  IMPRESSION:  1. Calcified tri leaflet AV with annular area of 512 mm 2 suitable  for a 26 mm Sapien 3 valve  2. Coronary arteries sufficient height above annulus for deployment  Native RCA occluded  3. Normal aortic root 2.9 cm with no aneurysm  4. Optimum angiographic angle for deployment LAO 3 Caudal 3 degrees  5. Significant calcification of the STJ  Jenkins Rouge  Electronically Signed  By: Jenkins Rouge M.D.  On: 07/14/2019 13:19   Addended by Josue Hector, MD on 07/14/2019 1:22 PM  Study Result   EXAM:  OVER-READ INTERPRETATION CT CHEST  The following report is an over-read performed by radiologist Dr.  Vinnie Langton of [facility], [place] on 07/14/2019. This  over-read does not include interpretation of cardiac or coronary  anatomy or pathology. The coronary calcium score/coronary CTA  interpretation by the cardiologist is attached.  COMPARISON: Cardiac CTA 06/28/2019.  FINDINGS:  Extracardiac findings will be described separately on dictation for  contemporaneously obtained CTA chest, abdomen and pelvis.  IMPRESSION:  Please see separate dictation for contemporaneously obtained CTA  chest, abdomen and pelvis dated 07/14/2019 for full description of  relevant extracardiac findings.  Electronically Signed:  By: Vinnie Langton M.D.  On: 07/14/2019 12:03   CLINICAL DATA: 79 year old male with history of  severe aortic  stenosis. Preprocedural study prior to potential transcatheter  aortic valve replacement (TAVR) procedure.  EXAM:  CT ANGIOGRAPHY CHEST, ABDOMEN AND PELVIS  TECHNIQUE:  Non-contrast CT of the chest was initially obtained.  Multidetector CT imaging through the chest, abdomen and pelvis was  performed using the standard protocol during bolus administration of  intravenous contrast. Multiplanar reconstructed images and MIPs were  obtained and reviewed to evaluate the vascular anatomy.  CONTRAST: 141m OMNIPAQUE IOHEXOL 350 MG/ML SOLN  COMPARISON: Cardiac CTA 06/28/2019.  FINDINGS:  CTA CHEST FINDINGS  Cardiovascular: Heart size is normal. There is no significant  pericardial fluid, thickening or pericardial calcification. There is  aortic atherosclerosis, as well as atherosclerosis of the great  vessels of the mediastinum and the coronary arteries, including  calcified atherosclerotic plaque in the left main, left anterior  descending, left circumflex and right coronary arteries. Severe  thickening calcification of the aortic valve.  Mediastinum/Lymph Nodes: No pathologically enlarged mediastinal or  hilar lymph nodes.  Esophagus is unremarkable in appearance. No  axillary lymphadenopathy.  Lungs/Pleura: Several pulmonary nodules are noted in the lungs  bilaterally, largest of which is in the base of the right lower lobe  medially (axial image 63 of series 5) measuring 1.3 x 0.9 cm. The  largest nodule in the left lung is in the left upper lobe (axial  image 31 of series 5) measuring 9 x 4 mm. No acute consolidative  airspace disease. Trace bilateral pleural effusions lying  dependently.  Musculoskeletal/Soft Tissues: There are no aggressive appearing  lytic or blastic lesions noted in the visualized portions of the  skeleton.  CTA ABDOMEN AND PELVIS FINDINGS  Hepatobiliary: No suspicious cystic or solid hepatic lesions. No  intra or extrahepatic biliary ductal  dilatation. Tiny calcified  gallstone lying dependently in the gallbladder. No findings to  suggest an acute cholecystitis at this time.  Pancreas: No pancreatic mass. No pancreatic ductal dilatation. No  pancreatic or peripancreatic fluid collections or inflammatory  changes.  Spleen: Unremarkable.  Adrenals/Urinary Tract: Large heterogeneously enhancing  hypervascular mass in the interpolar region of the left kidney  measuring 7.2 x 6.1 x 8.3 cm (axial image 116 of series 4 and  coronal image 88 of series 6). This lesion extends laterally toward  the left flank wall musculature, without direct invasion.  Neovascularity associated with this lesion. This lesion is separated  from the left renal vein which is widely patent at this time.  Multiple other low-attenuation lesions in the kidneys bilaterally,  largest of which are compatible with simple cysts, measuring up to  5.2 cm in diameter in the interpolar region of the left kidney.  Bilateral adrenal glands are normal in appearance. No  hydroureteronephrosis. Small calcifications in the posterolateral  aspect of the urinary bladder, potentially nonobstructive calculi or  mural calcifications.  Stomach/Bowel: Normal appearance of the stomach. No pathologic  dilatation of small bowel or colon. A few scattered colonic  diverticulae are noted, without surrounding inflammatory changes to  suggest an acute diverticulitis at this time. Normal appendix.  Vascular/Lymphatic: Aortic atherosclerosis, without evidence of  aneurysm or dissection in the abdominal or pelvic vasculature.  Vascular findings and measurements pertinent to potential TAVR  procedure, as detailed below. No lymphadenopathy noted in the  abdomen or pelvis.  Reproductive: Prostate gland is enlarged with median lobe  hypertrophy measuring up to 7.0 x 5.7 x 8.1 cm. Seminal vesicles are  unremarkable in appearance.  Other: No significant volume of ascites. No pneumoperitoneum.   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 There are no aggressive appearing  lytic or blastic lesions noted in the visualized portions of the  skeleton.  VASCULAR MEASUREMENTS PERTINENT TO TAVR:  AORTA:  Minimal Aortic Diameter-10 x 10 mm  Severity of Aortic Calcification-severe  RIGHT PELVIS:  Right Common Iliac Artery -  Minimal Diameter-7.3 x 6.1 mm  Tortuosity-mild  Calcification-moderate  Right External Iliac Artery -  Minimal Diameter-7.2 x 7.1 mm  Tortuosity - mild  Calcification-none  Right Common Femoral Artery -  Minimal Diameter-6.5 x 6.4 mm  Tortuosity - mild  Calcification-mild  LEFT PELVIS:  Left Common Iliac Artery -  Minimal Diameter-7.6 x 6.7 mm  Tortuosity - mild  Calcification-moderate  Left External Iliac Artery -  Minimal Diameter-7.7 x 6.9 mm  Tortuosity - mild  Calcification-none  Left Common Femoral Artery -  Minimal Diameter-6.6 x 4.8 mm  Tortuosity - mild  Calcification-mild  Review of  the MIP images confirms the above findings.  IMPRESSION:  1. Vascular findings and measurements pertinent to potential TAVR  procedure, as detailed above.  2. Severe thickening calcification of the aortic valve, compatible  with reported clinical history of severe aortic stenosis.  3. Large heterogeneously enhancing left renal mass measuring 7.2 x  6.1 x 8.3 cm, highly concerning for primary renal cell carcinoma.  This appears likely encapsulated within Gerota's fascia, does not  invade the left renal vein, and is not associated with  lymphadenopathy. However, there are several small pulmonary nodules  noted throughout the lungs bilaterally. The possibility of  metastatic disease to the lungs is not entirely excluded. The  largest of these pulmonary nodules is in the right lower lobe  measuring 1.3 x 0.9 cm. Urologic consultation is recommended, and  evaluation with PET-CT is suggested in the near future.  4.  Cholelithiasis without evidence of acute cholecystitis at this  time.  5. Additional incidental findings, as above.  These results will be called to the ordering clinician or  representative by the Radiologist Assistant, and communication  documented in the PACS or [employer].  Electronically Signed  By: Vinnie Langton M.D.  On: 07/14/2019 12:35  Carotid Arterial Duplex Study   Indications: Carotid artery disease. No new cerebrovascular  symptoms.  Occasional intermittent chest pain.  Risk Factors: Hypertension, hyperlipidemia, past history of smoking.  Comparison Study: In 2/20, a carotid duplex showed RICA velocities of  403/47  cm/sec and LICA velocities of 425/95 cm/sec.   Performing Technologist: Mariane Masters RVT    Examination Guidelines: A complete evaluation includes B-mode imaging,  spectral  Doppler, color Doppler, and power Doppler as needed of all accessible  portions  of each vessel. Bilateral testing is considered an integral part of a  complete  examination. Limited examinations for reoccurring indications may be  performed  as noted.    Right Carotid Findings:  +----------+--------+--------+--------+------------------+-----------------  ----+   PSV cm/sEDV cm/sStenosisPlaque DescriptionComments    +----------+--------+--------+--------+------------------+-----------------  ----+  CCA Prox 75 10       +----------+--------+--------+--------+------------------+-----------------  ----+  CCA Distal51 6   intimal  thickening   +----------+--------+--------+--------+------------------+-----------------  ----+  ICA Prox 260 30 40-59% heterogenous and based on peak        calcific systolic         velocity/plaque    +----------+--------+--------+--------+------------------+-----------------  ----+  ICA Mid 134 24       +----------+--------+--------+--------+------------------+-----------------   ----+  ICA Distal77 13       +----------+--------+--------+--------+------------------+-----------------  ----+  ECA 232 1 >50%      +----------+--------+--------+--------+------------------+-----------------  ----+   +----------+--------+-------+----------------+-------------------+   PSV cm/sEDV cmsDescribe Arm Pressure (mmHG)  +----------+--------+-------+----------------+-------------------+  [id number]  Multiphasic, OAC166   +----------+--------+-------+----------------+-------------------+   +---------+--------+--+--------+-+---------+  VertebralPSV cm/s21EDV cm/s0Antegrade  +---------+--------+--+--------+-+---------+     Left Carotid Findings:  +----------+--------+--------+--------+------------------+--------+   PSV cm/sEDV cm/sStenosisPlaque DescriptionComments  +----------+--------+--------+--------+------------------+--------+  CCA Prox 112 12      +----------+--------+--------+--------+------------------+--------+  CCA Distal73 7 <50% smooth    +----------+--------+--------+--------+------------------+--------+  ICA Prox 99 13 1-39% heterogenous    +----------+--------+--------+--------+------------------+--------+  ICA Mid 89 8      +----------+--------+--------+--------+------------------+--------+  ICA Distal66 11      +----------+--------+--------+--------+------------------+--------+  ECA 87 1      +----------+--------+--------+--------+------------------+--------+   +----------+--------+--------+----------------+-------------------+   PSV cm/sEDV cm/sDescribe Arm Pressure (mmHG)  +----------+--------+--------+----------------+-------------------+  Subclavian80  Multiphasic, WNL160   +----------+--------+--------+----------------+-------------------+   +---------+--------+--+--------+--+---------+  VertebralPSV cm/s48EDV cm/s11Antegrade    +---------+--------+--+--------+--+---------+      Summary:  Right Carotid:  Velocities in the right ICA are consistent with a 40-59%  stenosis, based on peak systolic velocities. The ECA  appears >50%  stenosed.   Left Carotid: Velocities in the left ICA are consistent with a 1-39%  stenosis.  Non-hemodynamically significant plaque <50% noted in the  CCA.   Vertebrals: Left vertebral artery demonstrates antegrade flow. Right  vertebral  artery demonstrates high resistant flow.  Subclavians: Normal flow hemodynamics were seen in bilateral subclavian  arteries.   *See table(s) above for measurements and observations.   Suggest follow up study in 12 months.    Electronically signed by Jenkins Rouge MD on 06/01/2019 at 5:00:01 PM.     Procedure: AVR + CAB   Risk of Mortality:  2.477%  Renal Failure:  3.185%  Permanent Stroke:  2.195%  Prolonged Ventilation:  8.996%  DSW Infection:  0.186%  Reoperation:  3.739%  Morbidity or Mortality:  14.762%  Short Length of Stay:  29.486%  Long Length of Stay:  6.738%    Impression:   This 79 year old gentleman has stage D, severe, symptomatic aortic stenosis with New York Heart Association class II symptoms of exertional fatigue consistent with chronic diastolic congestive heart failure. He has not physically very active which probably limits his symptoms. I have personally reviewed his 2D echocardiogram, cardiac catheterization, and CTA studies. His echocardiogram shows a trileaflet aortic valve with severe calcification, leaflet thickening, and restricted mobility. The aortic valve mean gradient was 43 mmHg which is increased significantly compared to his echo in February 2020 when the mean gradient was 29 mmHg. The aortic valve area by VTI measures 0.68 cm consistent with severe aortic stenosis. Left ventricular ejection fraction normal. Cardiac catheterization shows three-vessel coronary disease with a chronically occluded  ostial right coronary artery filling by collaterals from the left. The LAD and left circumflex have what I would consider moderate stenoses of 70% at the most. The RFR was abnormal in the mid to distal portion of the LAD and left circumflex with an occluded right coronary artery supplied by collaterals but the stenoses are not high-grade and he only has vague occasional chest discomfort that may not be angina. I think his severe aortic stenosis and coronary disease could be treated with open surgical AVR and CABG but given his age and complicating factors discovered on his CT scan work-up I think TAVR and medical treatment of his coronary disease is the best option. His gated cardiac CTA shows anatomy suitable for transcatheter aortic valve replacement using a SAPIEN 3 valve. His abdominal and pelvic CTA shows anatomy suitable for transfemoral insertion. The CT scans also showed a large mass in the left kidney highly suspicious for renal cell carcinoma as well as multiple lung nodules largest of which measures 1.3 x 0.9 cm in the right lower lobe. There is no evidence of local regional or distant metastatic disease on PET scan. I think would be best to avoid open surgical AVR and CABG in this situation which would take him 3 to 6 months to recover before he could have any surgical procedure on his kidney. I think it is probably also best to avoid PCI for his coronary disease at this time which would require dual antiplatelet therapy and a delay of the least 3 months in treating his kidney disease. I think he should probably undergo TAVR and subsequent surgery on his kidney if indicated with low surgical risk since he does not have any high grade coronary stenoses and the RCA is well collateralized.  The patient and his wife were counseled at length regarding treatment alternatives for management of severe symptomatic aortic stenosis. The risks and benefits of surgical intervention has been discussed in detail.  Long-term prognosis with medical therapy was discussed. Alternative approaches such as conventional surgical aortic valve replacement, transcatheter aortic valve replacement, and palliative medical therapy were compared and contrasted at length. This discussion was placed in the context of the patient's own specific clinical presentation and past medical history. All of their questions have been addressed.   Following the decision to proceed with transcatheter aortic valve replacement, a discussion was held regarding what types of management strategies would be attempted intraoperatively in the event of life-threatening complications, including whether or not the patient would be considered a candidate for the use of cardiopulmonary bypass and/or conversion to open sternotomy for attempted surgical intervention. The patient is aware of the fact that transient use of cardiopulmonary bypass may be necessary.  He is a low surgical risk patient and I would consider him a candidate for emergent sternotomy if needed to manage any intraoperative complications.  The patient has been advised of a variety of complications that might develop including but not limited to risks of death, stroke, paravalvular leak, aortic dissection or other major vascular complications, aortic annulus rupture, device embolization, cardiac rupture or perforation, mitral regurgitation, acute myocardial infarction, arrhythmia, heart block or bradycardia requiring permanent pacemaker placement, congestive heart failure, respiratory failure, renal failure, pneumonia, infection, other late complications related to structural valve deterioration or migration, or other complications that might ultimately cause a temporary or permanent loss of functional independence or other long term morbidity. The patient provides full informed consent for the procedure as described and all questions were answered.    Gaye Pollack, MD

## 2019-08-14 NOTE — Anesthesia Preprocedure Evaluation (Addendum)
Anesthesia Evaluation  Patient identified by MRN, date of birth, ID band Patient awake    Reviewed: Allergy & Precautions, NPO status , Patient's Chart, lab work & pertinent test results, reviewed documented beta blocker date and time   History of Anesthesia Complications Negative for: history of anesthetic complications  Airway Mallampati: II  TM Distance: >3 FB Neck ROM: Full    Dental  (+) Dental Advisory Given   Pulmonary former smoker,  08/11/2019 SARS coronavirus NEG Multiple pulmonary nodules   breath sounds clear to auscultation       Cardiovascular hypertension, Pt. on medications and Pt. 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), Insulin Dependent  Renal/GU Renal diseaseLeft renal mass: prob renal carcinoma     Musculoskeletal   Abdominal   Peds  Hematology negative hematology ROS (+)   Anesthesia Other Findings   Reproductive/Obstetrics                            Anesthesia Physical Anesthesia Plan  ASA: IV  Anesthesia Plan: MAC   Post-op Pain Management:    Induction: Intravenous  PONV Risk Score and Plan: 1 and Treatment may vary due to age or medical condition  Airway Management Planned: Natural Airway and Simple Face Mask  Additional Equipment: Arterial line  Intra-op Plan:   Post-operative Plan:   Informed Consent: I have reviewed the patients History and Physical, chart, labs and discussed the procedure including the risks, benefits and alternatives for the proposed anesthesia with the patient or authorized representative  who has indicated his/her understanding and acceptance.     Dental advisory given  Plan Discussed with: CRNA and Surgeon  Anesthesia Plan Comments:       Anesthesia Quick Evaluation

## 2019-08-15 ENCOUNTER — Inpatient Hospital Stay (HOSPITAL_COMMUNITY): Payer: Medicare Other | Admitting: Physician Assistant

## 2019-08-15 ENCOUNTER — Other Ambulatory Visit: Payer: Self-pay | Admitting: Physician Assistant

## 2019-08-15 ENCOUNTER — Inpatient Hospital Stay (HOSPITAL_COMMUNITY): Payer: Medicare Other

## 2019-08-15 ENCOUNTER — Encounter (HOSPITAL_COMMUNITY): Payer: Self-pay | Admitting: Cardiovascular Disease

## 2019-08-15 ENCOUNTER — Encounter (HOSPITAL_COMMUNITY)
Admission: RE | Disposition: A | Discharge status: Skilled Nursing Facility | Payer: Self-pay | Source: Home / Self Care | Attending: Cardiovascular Disease

## 2019-08-15 ENCOUNTER — Inpatient Hospital Stay (HOSPITAL_COMMUNITY)
Admission: RE | Admit: 2019-08-15 | Discharge: 2019-08-31 | DRG: 266 | Disposition: A | Discharge status: Skilled Nursing Facility | Payer: Medicare Other | Attending: Cardiovascular Disease | Admitting: Cardiovascular Disease

## 2019-08-15 DIAGNOSIS — N3001 Acute cystitis with hematuria: Secondary | ICD-10-CM | POA: Diagnosis not present

## 2019-08-15 DIAGNOSIS — R0989 Other specified symptoms and signs involving the circulatory and respiratory systems: Secondary | ICD-10-CM | POA: Diagnosis present

## 2019-08-15 DIAGNOSIS — I779 Disorder of arteries and arterioles, unspecified: Secondary | ICD-10-CM | POA: Diagnosis present

## 2019-08-15 DIAGNOSIS — Z87891 Personal history of nicotine dependence: Secondary | ICD-10-CM

## 2019-08-15 DIAGNOSIS — R29701 NIHSS score 1: Secondary | ICD-10-CM | POA: Diagnosis not present

## 2019-08-15 DIAGNOSIS — F419 Anxiety disorder, unspecified: Secondary | ICD-10-CM | POA: Diagnosis present

## 2019-08-15 DIAGNOSIS — G4730 Sleep apnea, unspecified: Secondary | ICD-10-CM | POA: Diagnosis present

## 2019-08-15 DIAGNOSIS — R338 Other retention of urine: Secondary | ICD-10-CM | POA: Diagnosis not present

## 2019-08-15 DIAGNOSIS — I10 Essential (primary) hypertension: Secondary | ICD-10-CM | POA: Diagnosis not present

## 2019-08-15 DIAGNOSIS — G459 Transient cerebral ischemic attack, unspecified: Secondary | ICD-10-CM | POA: Diagnosis present

## 2019-08-15 DIAGNOSIS — G4733 Obstructive sleep apnea (adult) (pediatric): Secondary | ICD-10-CM | POA: Diagnosis present

## 2019-08-15 DIAGNOSIS — Z85810 Personal history of malignant neoplasm of tongue: Secondary | ICD-10-CM | POA: Diagnosis not present

## 2019-08-15 DIAGNOSIS — E785 Hyperlipidemia, unspecified: Secondary | ICD-10-CM | POA: Diagnosis present

## 2019-08-15 DIAGNOSIS — C642 Malignant neoplasm of left kidney, except renal pelvis: Secondary | ICD-10-CM | POA: Diagnosis present

## 2019-08-15 DIAGNOSIS — E782 Mixed hyperlipidemia: Secondary | ICD-10-CM | POA: Diagnosis not present

## 2019-08-15 DIAGNOSIS — I5033 Acute on chronic diastolic (congestive) heart failure: Secondary | ICD-10-CM | POA: Diagnosis present

## 2019-08-15 DIAGNOSIS — Z952 Presence of prosthetic heart valve: Secondary | ICD-10-CM

## 2019-08-15 DIAGNOSIS — N5082 Scrotal pain: Secondary | ICD-10-CM

## 2019-08-15 DIAGNOSIS — I350 Nonrheumatic aortic (valve) stenosis: Secondary | ICD-10-CM

## 2019-08-15 DIAGNOSIS — C7800 Secondary malignant neoplasm of unspecified lung: Secondary | ICD-10-CM | POA: Diagnosis present

## 2019-08-15 DIAGNOSIS — N401 Enlarged prostate with lower urinary tract symptoms: Secondary | ICD-10-CM | POA: Diagnosis present

## 2019-08-15 DIAGNOSIS — R55 Syncope and collapse: Secondary | ICD-10-CM | POA: Diagnosis not present

## 2019-08-15 DIAGNOSIS — I6522 Occlusion and stenosis of left carotid artery: Secondary | ICD-10-CM | POA: Diagnosis present

## 2019-08-15 DIAGNOSIS — I25119 Atherosclerotic heart disease of native coronary artery with unspecified angina pectoris: Secondary | ICD-10-CM | POA: Diagnosis not present

## 2019-08-15 DIAGNOSIS — N2889 Other specified disorders of kidney and ureter: Secondary | ICD-10-CM | POA: Diagnosis not present

## 2019-08-15 DIAGNOSIS — N5089 Other specified disorders of the male genital organs: Secondary | ICD-10-CM

## 2019-08-15 DIAGNOSIS — I25111 Atherosclerotic heart disease of native coronary artery with angina pectoris with documented spasm: Secondary | ICD-10-CM | POA: Diagnosis present

## 2019-08-15 DIAGNOSIS — I951 Orthostatic hypotension: Secondary | ICD-10-CM | POA: Diagnosis not present

## 2019-08-15 DIAGNOSIS — I5032 Chronic diastolic (congestive) heart failure: Secondary | ICD-10-CM

## 2019-08-15 DIAGNOSIS — Z79899 Other long term (current) drug therapy: Secondary | ICD-10-CM

## 2019-08-15 DIAGNOSIS — I2582 Chronic total occlusion of coronary artery: Secondary | ICD-10-CM | POA: Diagnosis present

## 2019-08-15 DIAGNOSIS — I110 Hypertensive heart disease with heart failure: Secondary | ICD-10-CM | POA: Diagnosis present

## 2019-08-15 DIAGNOSIS — Z006 Encounter for examination for normal comparison and control in clinical research program: Secondary | ICD-10-CM

## 2019-08-15 DIAGNOSIS — N179 Acute kidney failure, unspecified: Secondary | ICD-10-CM | POA: Diagnosis not present

## 2019-08-15 DIAGNOSIS — M316 Other giant cell arteritis: Secondary | ICD-10-CM | POA: Diagnosis present

## 2019-08-15 DIAGNOSIS — Z794 Long term (current) use of insulin: Secondary | ICD-10-CM | POA: Diagnosis not present

## 2019-08-15 DIAGNOSIS — I639 Cerebral infarction, unspecified: Secondary | ICD-10-CM | POA: Diagnosis not present

## 2019-08-15 DIAGNOSIS — E119 Type 2 diabetes mellitus without complications: Secondary | ICD-10-CM | POA: Diagnosis present

## 2019-08-15 DIAGNOSIS — Z8673 Personal history of transient ischemic attack (TIA), and cerebral infarction without residual deficits: Secondary | ICD-10-CM

## 2019-08-15 DIAGNOSIS — I6523 Occlusion and stenosis of bilateral carotid arteries: Secondary | ICD-10-CM | POA: Diagnosis not present

## 2019-08-15 DIAGNOSIS — Z20822 Contact with and (suspected) exposure to covid-19: Secondary | ICD-10-CM | POA: Diagnosis present

## 2019-08-15 DIAGNOSIS — R310 Gross hematuria: Secondary | ICD-10-CM | POA: Diagnosis not present

## 2019-08-15 DIAGNOSIS — N390 Urinary tract infection, site not specified: Secondary | ICD-10-CM | POA: Diagnosis not present

## 2019-08-15 DIAGNOSIS — K5900 Constipation, unspecified: Secondary | ICD-10-CM | POA: Diagnosis not present

## 2019-08-15 DIAGNOSIS — N453 Epididymo-orchitis: Secondary | ICD-10-CM | POA: Diagnosis not present

## 2019-08-15 DIAGNOSIS — R32 Unspecified urinary incontinence: Secondary | ICD-10-CM | POA: Diagnosis not present

## 2019-08-15 DIAGNOSIS — I2510 Atherosclerotic heart disease of native coronary artery without angina pectoris: Secondary | ICD-10-CM | POA: Diagnosis present

## 2019-08-15 DIAGNOSIS — E7800 Pure hypercholesterolemia, unspecified: Secondary | ICD-10-CM | POA: Diagnosis not present

## 2019-08-15 DIAGNOSIS — R918 Other nonspecific abnormal finding of lung field: Secondary | ICD-10-CM | POA: Diagnosis present

## 2019-08-15 DIAGNOSIS — I493 Ventricular premature depolarization: Secondary | ICD-10-CM | POA: Diagnosis present

## 2019-08-15 HISTORY — DX: Other specified disorders of kidney and ureter: N28.89

## 2019-08-15 HISTORY — DX: Presence of prosthetic heart valve: Z95.2

## 2019-08-15 HISTORY — PX: TEE WITHOUT CARDIOVERSION: SHX5443

## 2019-08-15 HISTORY — PX: TRANSCATHETER AORTIC VALVE REPLACEMENT, TRANSFEMORAL: SHX6400

## 2019-08-15 LAB — POCT I-STAT, CHEM 8
BUN: 24 mg/dL — ABNORMAL HIGH (ref 8–23)
BUN: 22 mg/dL (ref 8–23)
BUN: 22 mg/dL (ref 8–23)
BUN: 23 mg/dL (ref 8–23)
Calcium, Ion: 1.34 mmol/L (ref 1.15–1.40)
Calcium, Ion: 1.3 mmol/L (ref 1.15–1.40)
Calcium, Ion: 1.31 mmol/L (ref 1.15–1.40)
Calcium, Ion: 1.29 mmol/L (ref 1.15–1.40)
Chloride: 99 mmol/L (ref 98–111)
Chloride: 100 mmol/L (ref 98–111)
Chloride: 100 mmol/L (ref 98–111)
Chloride: 100 mmol/L (ref 98–111)
Creatinine, Ser: 1.1 mg/dL (ref 0.61–1.24)
Creatinine, Ser: 1.1 mg/dL (ref 0.61–1.24)
Creatinine, Ser: 1.1 mg/dL (ref 0.61–1.24)
Creatinine, Ser: 1 mg/dL (ref 0.61–1.24)
Glucose, Bld: 117 mg/dL — ABNORMAL HIGH (ref 70–99)
Glucose, Bld: 138 mg/dL — ABNORMAL HIGH (ref 70–99)
Glucose, Bld: 138 mg/dL — ABNORMAL HIGH (ref 70–99)
Glucose, Bld: 128 mg/dL — ABNORMAL HIGH (ref 70–99)
HCT: 40 % (ref 39.0–52.0)
HCT: 34 % — ABNORMAL LOW (ref 39.0–52.0)
HCT: 39 % (ref 39.0–52.0)
HCT: 37 % — ABNORMAL LOW (ref 39.0–52.0)
Hemoglobin: 13.6 g/dL (ref 13.0–17.0)
Hemoglobin: 11.6 g/dL — ABNORMAL LOW (ref 13.0–17.0)
Hemoglobin: 13.3 g/dL (ref 13.0–17.0)
Hemoglobin: 12.6 g/dL — ABNORMAL LOW (ref 13.0–17.0)
Potassium: 3.7 mmol/L (ref 3.5–5.1)
Potassium: 3.9 mmol/L (ref 3.5–5.1)
Potassium: 3.9 mmol/L (ref 3.5–5.1)
Potassium: 4 mmol/L (ref 3.5–5.1)
Sodium: 137 mmol/L (ref 135–145)
Sodium: 138 mmol/L (ref 135–145)
Sodium: 139 mmol/L (ref 135–145)
Sodium: 138 mmol/L (ref 135–145)
TCO2: 27 mmol/L (ref 22–32)
TCO2: 26 mmol/L (ref 22–32)
TCO2: 28 mmol/L (ref 22–32)
TCO2: 26 mmol/L (ref 22–32)

## 2019-08-15 LAB — GLUCOSE, CAPILLARY
Glucose-Capillary: 119 mg/dL — ABNORMAL HIGH (ref 70–99)
Glucose-Capillary: 107 mg/dL — ABNORMAL HIGH (ref 70–99)
Glucose-Capillary: 158 mg/dL — ABNORMAL HIGH (ref 70–99)
Glucose-Capillary: 142 mg/dL — ABNORMAL HIGH (ref 70–99)

## 2019-08-15 SURGERY — IMPLANTATION, AORTIC VALVE, TRANSCATHETER, FEMORAL APPROACH
Anesthesia: Monitor Anesthesia Care | Site: Chest

## 2019-08-15 MED ORDER — SODIUM CHLORIDE 0.9 % IV SOLN: INTRAVENOUS | Status: DC

## 2019-08-15 MED ORDER — CHLORHEXIDINE GLUCONATE 0.12 % MT SOLN: 15.0000 mL | Freq: Once | OROMUCOSAL | Status: DC

## 2019-08-15 MED ORDER — PROPOFOL 10 MG/ML IV BOLUS
INTRAVENOUS | Status: DC | PRN
  Administered 2019-08-15: 20 mg via INTRAVENOUS
  Administered 2019-08-15 (×2): 10 mg via INTRAVENOUS

## 2019-08-15 MED ORDER — ISOSORBIDE MONONITRATE ER 30 MG PO TB24
30.0000 mg | ORAL_TABLET | Freq: Every day | ORAL | Status: DC
  Administered 2019-08-15 – 2019-08-18 (×4): 30 mg via ORAL
  Filled 2019-08-15 (×4): qty 1

## 2019-08-15 MED ORDER — FENTANYL CITRATE (PF) 100 MCG/2ML IJ SOLN
INTRAMUSCULAR | Status: DC | PRN
  Administered 2019-08-15: 50 ug via INTRAVENOUS

## 2019-08-15 MED ORDER — CHLORHEXIDINE GLUCONATE 4 % EX LIQD: 30.0000 mL | CUTANEOUS | Status: DC

## 2019-08-15 MED ORDER — TRAMADOL HCL 50 MG PO TABS
50.0000 mg | ORAL_TABLET | ORAL | Status: DC | PRN
  Administered 2019-08-16 – 2019-08-18 (×4): 50 mg via ORAL
  Administered 2019-08-19 – 2019-08-28 (×7): 100 mg via ORAL
  Filled 2019-08-15: qty 2
  Filled 2019-08-15: qty 1
  Filled 2019-08-15 (×4): qty 2
  Filled 2019-08-15: qty 1
  Filled 2019-08-15 (×2): qty 2
  Filled 2019-08-15 (×2): qty 1

## 2019-08-15 MED ORDER — INSULIN ASPART 100 UNIT/ML ~~LOC~~ SOLN
0.0000 [IU] | Freq: Three times a day (TID) | SUBCUTANEOUS | Status: DC
  Administered 2019-08-15 (×2): 2 [IU] via SUBCUTANEOUS
  Administered 2019-08-16: 8 [IU] via SUBCUTANEOUS
  Administered 2019-08-16: 4 [IU] via SUBCUTANEOUS
  Administered 2019-08-16: 2 [IU] via SUBCUTANEOUS
  Administered 2019-08-16: 8 [IU] via SUBCUTANEOUS
  Administered 2019-08-17: 2 [IU] via SUBCUTANEOUS
  Administered 2019-08-17: 8 [IU] via SUBCUTANEOUS
  Administered 2019-08-17: 2 [IU] via SUBCUTANEOUS
  Administered 2019-08-17 – 2019-08-18 (×3): 4 [IU] via SUBCUTANEOUS
  Administered 2019-08-18: 2 [IU] via SUBCUTANEOUS
  Administered 2019-08-18: 8 [IU] via SUBCUTANEOUS
  Administered 2019-08-19: 4 [IU] via SUBCUTANEOUS
  Administered 2019-08-19: 2 [IU] via SUBCUTANEOUS
  Administered 2019-08-19 (×2): 8 [IU] via SUBCUTANEOUS
  Administered 2019-08-20: 2 [IU] via SUBCUTANEOUS
  Administered 2019-08-20 (×3): 8 [IU] via SUBCUTANEOUS
  Administered 2019-08-21: 2 [IU] via SUBCUTANEOUS
  Administered 2019-08-21: 4 [IU] via SUBCUTANEOUS
  Administered 2019-08-21 – 2019-08-22 (×3): 8 [IU] via SUBCUTANEOUS
  Administered 2019-08-22: 2 [IU] via SUBCUTANEOUS
  Administered 2019-08-22: 4 [IU] via SUBCUTANEOUS
  Administered 2019-08-22 – 2019-08-23 (×2): 2 [IU] via SUBCUTANEOUS
  Administered 2019-08-23: 8 [IU] via SUBCUTANEOUS
  Administered 2019-08-23: 4 [IU] via SUBCUTANEOUS
  Administered 2019-08-23: 2 [IU] via SUBCUTANEOUS
  Administered 2019-08-24: 12 [IU] via SUBCUTANEOUS
  Administered 2019-08-24: 2 [IU] via SUBCUTANEOUS
  Administered 2019-08-24 – 2019-08-25 (×3): 4 [IU] via SUBCUTANEOUS
  Administered 2019-08-25: 12 [IU] via SUBCUTANEOUS
  Administered 2019-08-25: 2 [IU] via SUBCUTANEOUS
  Administered 2019-08-25: 8 [IU] via SUBCUTANEOUS
  Administered 2019-08-26: 2 [IU] via SUBCUTANEOUS
  Administered 2019-08-26 (×2): 4 [IU] via SUBCUTANEOUS
  Administered 2019-08-26: 2 [IU] via SUBCUTANEOUS
  Administered 2019-08-27: 4 [IU] via SUBCUTANEOUS
  Administered 2019-08-27: 2 [IU] via SUBCUTANEOUS
  Administered 2019-08-27 (×2): 8 [IU] via SUBCUTANEOUS
  Administered 2019-08-28: 2 [IU] via SUBCUTANEOUS
  Administered 2019-08-28 – 2019-08-29 (×4): 8 [IU] via SUBCUTANEOUS
  Administered 2019-08-29: 12 [IU] via SUBCUTANEOUS
  Administered 2019-08-29: 2 [IU] via SUBCUTANEOUS
  Administered 2019-08-29 – 2019-08-30 (×2): 4 [IU] via SUBCUTANEOUS

## 2019-08-15 MED ORDER — SODIUM CHLORIDE 0.9% FLUSH
3.0000 mL | INTRAVENOUS | Status: DC | PRN
  Administered 2019-08-25: 3 mL via INTRAVENOUS

## 2019-08-15 MED ORDER — MORPHINE SULFATE (PF) 2 MG/ML IV SOLN
1.0000 mg | INTRAVENOUS | Status: DC | PRN
  Administered 2019-08-16: 2 mg via INTRAVENOUS
  Filled 2019-08-15: qty 1

## 2019-08-15 MED ORDER — CHLORHEXIDINE GLUCONATE 0.12 % MT SOLN
15.0000 mL | Freq: Once | OROMUCOSAL | Status: AC
  Administered 2019-08-15: 15 mL via OROMUCOSAL
  Filled 2019-08-15: qty 15

## 2019-08-15 MED ORDER — PROTAMINE SULFATE 10 MG/ML IV SOLN
INTRAVENOUS | Status: DC | PRN
  Administered 2019-08-15: 90 mg via INTRAVENOUS

## 2019-08-15 MED ORDER — SODIUM CHLORIDE 0.9% FLUSH
3.0000 mL | Freq: Two times a day (BID) | INTRAVENOUS | Status: DC
  Administered 2019-08-15 – 2019-08-30 (×28): 3 mL via INTRAVENOUS

## 2019-08-15 MED ORDER — SODIUM CHLORIDE 0.9 % IV SOLN
INTRAVENOUS | Status: DC | PRN
  Administered 2019-08-15: 500 mL

## 2019-08-15 MED ORDER — ORAL CARE MOUTH RINSE: 15.0000 mL | Freq: Once | OROMUCOSAL | Status: AC

## 2019-08-15 MED ORDER — HEPARIN SODIUM (PORCINE) 1000 UNIT/ML IJ SOLN
INTRAMUSCULAR | Status: DC | PRN
Start: 2019-08-15 — End: 2019-08-15
  Administered 2019-08-15: 9000 [IU] via INTRAVENOUS

## 2019-08-15 MED ORDER — ROSUVASTATIN CALCIUM 5 MG PO TABS
5.0000 mg | ORAL_TABLET | Freq: Every day | ORAL | Status: DC
  Administered 2019-08-15 – 2019-08-31 (×17): 5 mg via ORAL
  Filled 2019-08-15 (×17): qty 1

## 2019-08-15 MED ORDER — PROPOFOL 500 MG/50ML IV EMUL
INTRAVENOUS | Status: DC | PRN
  Administered 2019-08-15: 10 ug/kg/min via INTRAVENOUS

## 2019-08-15 MED ORDER — CHLORHEXIDINE GLUCONATE 4 % EX LIQD: 60.0000 mL | Freq: Once | CUTANEOUS | Status: DC

## 2019-08-15 MED ORDER — ONDANSETRON HCL 4 MG/2ML IJ SOLN
INTRAMUSCULAR | Status: AC
  Filled 2019-08-15: qty 2

## 2019-08-15 MED ORDER — BISMUTH SUBSALICYLATE 262 MG/15ML PO SUSP
30.0000 mL | ORAL | Status: DC | PRN
  Administered 2019-08-15: 30 mL via ORAL
  Filled 2019-08-15: qty 236

## 2019-08-15 MED ORDER — SODIUM CHLORIDE 0.9 % IV SOLN
INTRAVENOUS | Status: AC
  Filled 2019-08-15 (×3): qty 1.2

## 2019-08-15 MED ORDER — LACTATED RINGERS IV SOLN: INTRAVENOUS | Status: DC | PRN

## 2019-08-15 MED ORDER — LACTATED RINGERS IV SOLN
INTRAVENOUS | Status: DC | PRN
Start: 2019-08-15 — End: 2019-08-15

## 2019-08-15 MED ORDER — NITROGLYCERIN 0.2 MG/ML ON CALL CATH LAB
INTRAVENOUS | Status: DC | PRN
  Administered 2019-08-15: 20 ug via INTRAVENOUS
  Administered 2019-08-15: 80 ug via INTRAVENOUS
  Administered 2019-08-15: 20 ug via INTRAVENOUS
  Administered 2019-08-15: 160 ug via INTRAVENOUS
  Administered 2019-08-15: 80 ug via INTRAVENOUS
  Administered 2019-08-15: 40 ug via INTRAVENOUS

## 2019-08-15 MED ORDER — NITROGLYCERIN IN D5W 200-5 MCG/ML-% IV SOLN
INTRAVENOUS | Status: DC | PRN
  Administered 2019-08-15: 50 ug/min via INTRAVENOUS
  Administered 2019-08-15: 5 ug/min via INTRAVENOUS

## 2019-08-15 MED ORDER — CLEVIDIPINE BUTYRATE 0.5 MG/ML IV EMUL
INTRAVENOUS | Status: DC | PRN
  Administered 2019-08-15: 4 mg/h via INTRAVENOUS

## 2019-08-15 MED ORDER — DOXAZOSIN MESYLATE 4 MG PO TABS: 4.0000 mg | ORAL_TABLET | Freq: Every day | ORAL | Status: DC

## 2019-08-15 MED ORDER — OXYCODONE HCL 5 MG PO TABS
5.0000 mg | ORAL_TABLET | ORAL | Status: DC | PRN
  Administered 2019-08-16 – 2019-08-28 (×3): 5 mg via ORAL
  Filled 2019-08-15 (×3): qty 1

## 2019-08-15 MED ORDER — FENTANYL CITRATE (PF) 250 MCG/5ML IJ SOLN
INTRAMUSCULAR | Status: AC
  Filled 2019-08-15: qty 5

## 2019-08-15 MED ORDER — DOXAZOSIN MESYLATE 2 MG PO TABS
4.0000 mg | ORAL_TABLET | Freq: Every day | ORAL | Status: DC
  Administered 2019-08-15 – 2019-08-18 (×4): 4 mg via ORAL
  Filled 2019-08-15 (×3): qty 2
  Filled 2019-08-15: qty 1

## 2019-08-15 MED ORDER — SODIUM CHLORIDE 0.9 % IV SOLN: 250.0000 mL | INTRAVENOUS | Status: DC | PRN

## 2019-08-15 MED ORDER — 0.9 % SODIUM CHLORIDE (POUR BTL) OPTIME
TOPICAL | Status: DC | PRN
  Administered 2019-08-15: 1000 mL

## 2019-08-15 MED ORDER — PHENYLEPHRINE HCL-NACL 20-0.9 MG/250ML-% IV SOLN
0.0000 ug/min | INTRAVENOUS | Status: DC
  Filled 2019-08-15: qty 250

## 2019-08-15 MED ORDER — ONDANSETRON HCL 4 MG/2ML IJ SOLN
INTRAMUSCULAR | Status: DC | PRN
  Administered 2019-08-15: 4 mg via INTRAVENOUS

## 2019-08-15 MED ORDER — VANCOMYCIN HCL IN DEXTROSE 1-5 GM/200ML-% IV SOLN
1000.0000 mg | Freq: Once | INTRAVENOUS | Status: AC
  Administered 2019-08-15: 1000 mg via INTRAVENOUS
  Filled 2019-08-15 (×2): qty 200

## 2019-08-15 MED ORDER — LIDOCAINE HCL 1 % IJ SOLN
INTRAMUSCULAR | Status: DC | PRN
  Administered 2019-08-15: 30 mL

## 2019-08-15 MED ORDER — CLONAZEPAM 0.5 MG PO TABS
0.5000 mg | ORAL_TABLET | Freq: Four times a day (QID) | ORAL | Status: DC
  Administered 2019-08-15 – 2019-08-31 (×64): 0.5 mg via ORAL
  Filled 2019-08-15 (×64): qty 1

## 2019-08-15 MED ORDER — LIDOCAINE HCL (PF) 1 % IJ SOLN
INTRAMUSCULAR | Status: AC
  Filled 2019-08-15: qty 30

## 2019-08-15 MED ORDER — DEXMEDETOMIDINE HCL 200 MCG/2ML IV SOLN
INTRAVENOUS | Status: DC | PRN
  Administered 2019-08-15: 90.7 ug via INTRAVENOUS

## 2019-08-15 MED ORDER — SODIUM CHLORIDE 0.9 % IV SOLN: INTRAVENOUS | Status: AC

## 2019-08-15 MED ORDER — ASPIRIN 81 MG PO CHEW
81.0000 mg | CHEWABLE_TABLET | Freq: Every day | ORAL | Status: DC
  Administered 2019-08-16 – 2019-08-31 (×16): 81 mg via ORAL
  Filled 2019-08-15 (×16): qty 1

## 2019-08-15 MED ORDER — LEVOFLOXACIN IN D5W 750 MG/150ML IV SOLN
750.0000 mg | INTRAVENOUS | Status: AC
  Administered 2019-08-16: 750 mg via INTRAVENOUS
  Filled 2019-08-15: qty 150

## 2019-08-15 MED ORDER — ACETAMINOPHEN 325 MG PO TABS
650.0000 mg | ORAL_TABLET | Freq: Four times a day (QID) | ORAL | Status: DC | PRN
  Administered 2019-08-15 – 2019-08-23 (×3): 650 mg via ORAL
  Filled 2019-08-15 (×3): qty 2

## 2019-08-15 MED ORDER — ONDANSETRON HCL 4 MG/2ML IJ SOLN
4.0000 mg | Freq: Four times a day (QID) | INTRAMUSCULAR | Status: DC | PRN
  Administered 2019-08-25 – 2019-08-26 (×2): 4 mg via INTRAVENOUS
  Filled 2019-08-15 (×3): qty 2

## 2019-08-15 MED ORDER — AMLODIPINE BESYLATE 5 MG PO TABS
5.0000 mg | ORAL_TABLET | Freq: Once | ORAL | Status: AC
  Administered 2019-08-15: 5 mg via ORAL
  Filled 2019-08-15 (×2): qty 1

## 2019-08-15 MED ORDER — METOPROLOL TARTRATE 25 MG PO TABS
25.0000 mg | ORAL_TABLET | Freq: Three times a day (TID) | ORAL | Status: DC
  Administered 2019-08-15: 25 mg via ORAL
  Filled 2019-08-15 (×2): qty 1

## 2019-08-15 MED ORDER — ACETAMINOPHEN 650 MG RE SUPP: 650.0000 mg | Freq: Four times a day (QID) | RECTAL | Status: DC | PRN

## 2019-08-15 MED ORDER — NITROGLYCERIN IN D5W 200-5 MCG/ML-% IV SOLN: 0.0000 ug/min | INTRAVENOUS | Status: DC

## 2019-08-15 MED ORDER — CLOPIDOGREL BISULFATE 75 MG PO TABS
75.0000 mg | ORAL_TABLET | Freq: Every day | ORAL | Status: DC
  Administered 2019-08-16: 75 mg via ORAL
  Filled 2019-08-15 (×2): qty 1

## 2019-08-15 MED ORDER — NITROGLYCERIN IN D5W 200-5 MCG/ML-% IV SOLN
INTRAVENOUS | Status: AC
  Filled 2019-08-15: qty 250

## 2019-08-15 SURGICAL SUPPLY — 98 items
ADH SKN CLS APL DERMABOND .7 (GAUZE/BANDAGES/DRESSINGS) ×4
APL PRP STRL LF DISP 70% ISPRP (MISCELLANEOUS) ×2
BAG DECANTER FOR FLEXI CONT (MISCELLANEOUS) IMPLANT
BAG SNAP BAND KOVER 36X36 (MISCELLANEOUS) ×3 IMPLANT
BLADE CLIPPER SURG (BLADE) IMPLANT
BLADE STERNUM SYSTEM 6 (BLADE) IMPLANT
BLADE SURG 10 STRL SS (BLADE) IMPLANT
CABLE ADAPT CONN TEMP 6FT (ADAPTER) ×2 IMPLANT
CABLE ADAPT PACING TEMP 12FT (ADAPTER) ×1 IMPLANT
CANISTER SUCT 3000ML PPV (MISCELLANEOUS) IMPLANT
CATH DIAG EXPO 6F AL1 (CATHETERS) ×1 IMPLANT
CATH DIAG EXPO 6F VENT PIG 145 (CATHETERS) ×6 IMPLANT
CATH EXTERNAL FEMALE PUREWICK (CATHETERS) IMPLANT
CATH INFINITI 6F AL2 (CATHETERS) IMPLANT
CATH S G BIP PACING (CATHETERS) ×3 IMPLANT
CHLORAPREP W/TINT 26 (MISCELLANEOUS) ×3 IMPLANT
CLIP VESOCCLUDE MED 24/CT (CLIP) IMPLANT
CLIP VESOCCLUDE SM WIDE 24/CT (CLIP) IMPLANT
CLOSURE MYNX CONTROL 6F/7F (Vascular Products) ×1 IMPLANT
CNTNR URN SCR LID CUP LEK RST (MISCELLANEOUS) ×4 IMPLANT
CONT SPEC 4OZ STRL OR WHT (MISCELLANEOUS) ×6
COVER BACK TABLE 80X110 HD (DRAPES) IMPLANT
COVER WAND RF STERILE (DRAPES) ×2 IMPLANT
DECANTER SPIKE VIAL GLASS SM (MISCELLANEOUS) ×3 IMPLANT
DERMABOND ADVANCED (GAUZE/BANDAGES/DRESSINGS) ×2
DERMABOND ADVANCED .7 DNX12 (GAUZE/BANDAGES/DRESSINGS) ×2 IMPLANT
DEVICE CLOSURE PERCLS PRGLD 6F (VASCULAR PRODUCTS) ×4 IMPLANT
DEVICE INFLATION ATRION QL2530 (MISCELLANEOUS) ×1 IMPLANT
DRAPE INCISE IOBAN 66X45 STRL (DRAPES) IMPLANT
DRSG TEGADERM 4X4.75 (GAUZE/BANDAGES/DRESSINGS) ×4 IMPLANT
ELECT CAUTERY BLADE 6.4 (BLADE) IMPLANT
ELECT REM PT RETURN 9FT ADLT (ELECTROSURGICAL) ×6
ELECTRODE REM PT RTRN 9FT ADLT (ELECTROSURGICAL) ×4 IMPLANT
FELT TEFLON 6X6 (MISCELLANEOUS) ×2 IMPLANT
GAUZE SPONGE 4X4 12PLY STRL (GAUZE/BANDAGES/DRESSINGS) ×3 IMPLANT
GLOVE BIO SURGEON STRL SZ 6.5 (GLOVE) ×2 IMPLANT
GLOVE BIO SURGEON STRL SZ7.5 (GLOVE) IMPLANT
GLOVE BIO SURGEON STRL SZ8 (GLOVE) IMPLANT
GLOVE BIOGEL PI IND STRL 6.5 (GLOVE) IMPLANT
GLOVE BIOGEL PI INDICATOR 6.5 (GLOVE) ×1
GLOVE EUDERMIC 7 POWDERFREE (GLOVE) IMPLANT
GLOVE INDICATOR 7.0 STRL GRN (GLOVE) ×1 IMPLANT
GLOVE ORTHO TXT STRL SZ7.5 (GLOVE) IMPLANT
GOWN STRL REUS W/ TWL LRG LVL3 (GOWN DISPOSABLE) IMPLANT
GOWN STRL REUS W/ TWL XL LVL3 (GOWN DISPOSABLE) ×2 IMPLANT
GOWN STRL REUS W/TWL LRG LVL3 (GOWN DISPOSABLE) ×9
GOWN STRL REUS W/TWL XL LVL3 (GOWN DISPOSABLE) ×3
GUIDEWIRE SAF TJ AMPL .035X180 (WIRE) ×3 IMPLANT
GUIDEWIRE SAFE TJ AMPLATZ EXST (WIRE) ×3 IMPLANT
INSERT FOGARTY SM (MISCELLANEOUS) IMPLANT
KIT BASIN OR (CUSTOM PROCEDURE TRAY) ×3 IMPLANT
KIT HEART LEFT (KITS) ×3 IMPLANT
KIT SUCTION CATH 14FR (SUCTIONS) IMPLANT
KIT TURNOVER KIT B (KITS) ×3 IMPLANT
LOOP VESSEL MAXI BLUE (MISCELLANEOUS) IMPLANT
LOOP VESSEL MINI RED (MISCELLANEOUS) IMPLANT
NS IRRIG 1000ML POUR BTL (IV SOLUTION) ×3 IMPLANT
PACK ENDO MINOR (CUSTOM PROCEDURE TRAY) ×3 IMPLANT
PAD ARMBOARD 7.5X6 YLW CONV (MISCELLANEOUS) ×6 IMPLANT
PAD ELECT DEFIB RADIOL ZOLL (MISCELLANEOUS) ×3 IMPLANT
PENCIL BUTTON HOLSTER BLD 10FT (ELECTRODE) IMPLANT
PERCLOSE PROGLIDE 6F (VASCULAR PRODUCTS) ×6
POSITIONER HEAD DONUT 9IN (MISCELLANEOUS) ×3 IMPLANT
SET MICROPUNCTURE 5F STIFF (MISCELLANEOUS) ×3 IMPLANT
SHEATH 14X36 EDWARDS (SHEATH) ×1 IMPLANT
SHEATH BRITE TIP 7FR 35CM (SHEATH) ×3 IMPLANT
SHEATH PINNACLE 6F 10CM (SHEATH) ×3 IMPLANT
SHEATH PINNACLE 8F 10CM (SHEATH) ×3 IMPLANT
SLEEVE REPOSITIONING LENGTH 30 (MISCELLANEOUS) ×3 IMPLANT
SPONGE GAUZE 2X2 8PLY STRL LF (GAUZE/BANDAGES/DRESSINGS) ×1 IMPLANT
STOPCOCK 4 WAY LG BORE MALE ST (IV SETS) ×1 IMPLANT
STOPCOCK MORSE 400PSI 3WAY (MISCELLANEOUS) ×6 IMPLANT
SUT ETHIBOND X763 2 0 SH 1 (SUTURE) IMPLANT
SUT GORETEX CV 4 TH 22 36 (SUTURE) IMPLANT
SUT GORETEX CV4 TH-18 (SUTURE) IMPLANT
SUT MNCRL AB 3-0 PS2 18 (SUTURE) IMPLANT
SUT PROLENE 5 0 C 1 36 (SUTURE) IMPLANT
SUT PROLENE 6 0 C 1 30 (SUTURE) IMPLANT
SUT SILK  1 MH (SUTURE) ×3
SUT SILK 1 MH (SUTURE) ×2 IMPLANT
SUT VIC AB 2-0 CT1 27 (SUTURE)
SUT VIC AB 2-0 CT1 TAPERPNT 27 (SUTURE) IMPLANT
SUT VIC AB 2-0 CTX 36 (SUTURE) IMPLANT
SUT VIC AB 3-0 SH 8-18 (SUTURE) IMPLANT
SYR 50ML LL SCALE MARK (SYRINGE) ×3 IMPLANT
SYR BULB IRRIG 60ML STRL (SYRINGE) IMPLANT
SYR MEDRAD MARK V 150ML (SYRINGE) ×3 IMPLANT
TOWEL GREEN STERILE (TOWEL DISPOSABLE) ×6 IMPLANT
TRANSDUCER W/STOPCOCK (MISCELLANEOUS) ×6 IMPLANT
TRAY FOLEY SLVR 14FR TEMP STAT (SET/KITS/TRAYS/PACK) IMPLANT
TUBE SUCT INTRACARD DLP 20F (MISCELLANEOUS) IMPLANT
TUBING ART PRESS 72  MALE/FEM (TUBING) ×3
TUBING ART PRESS 72 MALE/FEM (TUBING) IMPLANT
VALVE 26 ULTRA SAPIEN KIT (Valve) ×1 IMPLANT
WIRE AMPLATZ SS-J .035X180CM (WIRE) ×1 IMPLANT
WIRE EMERALD 3MM-J .035X150CM (WIRE) ×4 IMPLANT
WIRE EMERALD 3MM-J .035X260CM (WIRE) ×3 IMPLANT
WIRE EMERALD ST .035X260CM (WIRE) ×1 IMPLANT

## 2019-08-15 NOTE — Anesthesia Postprocedure Evaluation (Signed)
Anesthesia Post Note  Patient: Alexis Griffith  Procedure(s) Performed: TRANSCATHETER AORTIC VALVE REPLACEMENT, TRANSFEMORAL using [employer] SAPIEN 3 Ultra 26 mm THV. (N/A Chest) TRANSESOPHAGEAL ECHOCARDIOGRAM (TEE) (N/A )     Patient location during evaluation: Cath Lab Anesthesia Type: MAC Level of consciousness: awake and alert, oriented and patient cooperative Pain management: pain level controlled Vital Signs Assessment: post-procedure vital signs reviewed and stable Respiratory status: spontaneous breathing, nonlabored ventilation, respiratory function stable and patient connected to nasal cannula oxygen Cardiovascular status: blood pressure returned to baseline and stable Postop Assessment: no apparent nausea or vomiting Anesthetic complications: no   No complications documented.  Last Vitals:  Vitals:   08/15/19 1315 08/15/19 1330  BP: (!) 111/44 (!) 121/48  Pulse: (!) 54   Resp:    Temp:    SpO2:      Last Pain:  Vitals:   08/15/19 1225  TempSrc: Oral  PainSc:                  Karma Ansley,E. Rozelle Caudle

## 2019-08-15 NOTE — Progress Notes (Signed)
Mobility Specialist - Progress Note   08/15/19 1400  Mobility  Activity  (Cancel)  Mobility performed by Mobility specialist   Will not see pt today at nurse's request due to timing of bed rest.   Pricilla Handler Mobility Specialist

## 2019-08-15 NOTE — Progress Notes (Signed)
Patient to room 4E18 from cath lab. Vital signs obtained. CHG bath completed. On monitor CCMD notified. Alert and oriented to room and call light. Call bell within reach. Will continue to monitor.  Paulene Floor, RN

## 2019-08-15 NOTE — Op Note (Signed)
[facility]   MULTIDISCIPLINARY HEART VALVE TEAM   TAVR OPERATIVE NOTE   Date of Procedure:  08/15/2019  Preoperative Diagnosis: Severe Aortic Stenosis   Postoperative Diagnosis: Same   Procedure:    Transcatheter Aortic Valve Replacement - Percutaneous Right Transfemoral Approach  Edwards Sapien 3 Ultra THV (size 26 mm, model # 9750TFX, serial # [id number])   Co-Surgeons:  Gaye Pollack, MD and Sherren Mocha, MD    Anesthesiologist:  Annye Asa, MD  Echocardiographer:  Jerilynn Mages. Croitoru, MD  Pre-operative Echo Findings:  Severe aortic stenosis  Normal left ventricular systolic function  Post-operative Echo Findings:  NO paravalvular leak  Normal left ventricular systolic function   BRIEF CLINICAL NOTE AND INDICATIONS FOR SURGERY   This 79 year old gentleman has stage D, severe, symptomatic aortic stenosis with New York Heart Association class II symptoms of exertional fatigue consistent with chronic diastolic congestive heart failure. He has not physically very active which probably limits his symptoms. I have personally reviewed his 2D echocardiogram, cardiac catheterization, and CTA studies. His echocardiogram shows a trileaflet aortic valve with severe calcification, leaflet thickening, and restricted mobility. The aortic valve mean gradient was 43 mmHg which is increased significantly compared to his echo in February 2020 when the mean gradient was 29 mmHg. The aortic valve area by VTI measures 0.68 cm consistent with severe aortic stenosis. Left ventricular ejection fraction normal. 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 The LAD and left circumflex have what I would consider moderate stenoses of 70% at the most. The RFR was abnormal in the mid to distal portion of the LAD and left circumflex with an occluded right coronary artery supplied  by collaterals but the stenoses are not high-grade and he only has vague occasional chest discomfort that may not be angina. I think his severe aortic stenosis and coronary disease could be treated with open surgical AVR and CABG but given his age and complicating factors discovered on his CT scan work-up I think TAVR and medical treatment of his coronary disease is the best option. His gated cardiac CTA shows anatomy suitable for transcatheter aortic valve replacement using a SAPIEN 3 valve. His abdominal and pelvic CTA shows anatomy suitable for transfemoral insertion. The CT scans also showed a large mass in the left kidney highly suspicious for renal cell carcinoma as well as multiple lung nodules largest of which measures 1.3 x 0.9 cm in the right lower lobe. There is no evidence of local regional or distant metastatic disease on PET scan. I think would be best to avoid open surgical AVR and CABG in this situation which would take him 3 to 6 months to recover before he could have any surgical procedure on his kidney. I think it is probably also best to avoid PCI for his coronary disease at this time which would require dual antiplatelet therapy and a delay of the least 3 months in treating his kidney disease. I think he should probably undergo TAVR and subsequent surgery on his kidney if indicated with low surgical risk since he does not have any high grade coronary stenoses and the RCA is well collateralized.  The patientand his wife werecounseled at length regarding treatment alternatives for management of severe symptomatic aortic stenosis. The risks and benefits of surgical intervention has been discussed in detail. Long-term prognosis with medical therapy was discussed. Alternative approaches such as conventional surgical aortic valve replacement, transcatheter aortic valve  replacement, and palliative medical therapy were compared and contrasted at length. This discussion was placed in the context of  the patient's own specific clinical presentation and past medical history. All of their questions have been addressed.   Following the decision to proceed with transcatheter aortic valve replacement, a discussion was held regarding what types of management strategies would be attempted intraoperatively in the event of life-threatening complications, including whether or not the patient would be considered a candidate for the use of cardiopulmonary bypass and/or conversion to open sternotomy for attempted surgical intervention. The patient is aware of the fact that transient use of cardiopulmonary bypass may be necessary.He is a low surgical risk patient and I would consider him a candidate for emergent sternotomy if needed to manage any intraoperative complications. The patient has been advised of a variety of complications that might develop including but not limited to risks of death, stroke, paravalvular leak, aortic dissection or other major vascular complications, aortic annulus rupture, device embolization, cardiac rupture or perforation, mitral regurgitation, acute myocardial infarction, arrhythmia, heart block or bradycardia requiring permanent pacemaker placement, congestive heart failure, respiratory failure, renal failure, pneumonia, infection, other late complications related to structural valve deterioration or migration, or other complications that might ultimately cause a temporary or permanent loss of functional independence or other long term morbidity. The patient provides full informed consent for the procedure as described and all questions were answered.    DETAILS OF THE OPERATIVE PROCEDURE  PREPARATION:    The patient was brought to the operating room on the above mentioned date and appropriate monitoring was established by the anesthesia team. The patient was placed in the supine position on the operating table.  Intravenous antibiotics were administered. The patient was  monitored closely throughout the procedure under conscious sedation.  Baselinetransthoracic echocardiogram was performed. The patient's abdomen and both groins were prepped and draped in a sterile manner. A time out procedure was performed.   PERIPHERAL ACCESS:    Using the modified Seldinger technique, femoral arterial and venous access was obtained with placement of 6 Fr sheaths on the left side.  A pigtail diagnostic catheter was passed through the left arterial sheath under fluoroscopic guidance into the aortic root.  A temporary transvenous pacemaker catheter was passed through the left femoral venous sheath under fluoroscopic guidance into the right ventricle.  The pacemaker was tested to ensure stable lead placement and pacemaker capture. Aortic root angiography was performed in order to determine the optimal angiographic angle for valve deployment.   TRANSFEMORAL ACCESS:   Percutaneous transfemoral access and sheath placement was performed using ultrasound guidance.  The right common femoral artery was cannulated using a micropuncture needle and appropriate location was verified using hand injection angiogram.  A pair of Abbott Perclose percutaneous closure devices were placed and a 6 French sheath replaced into the femoral artery.  The patient was heparinized systemically and ACT verified > 250 seconds.    A 14 Fr transfemoral E-sheath was introduced into the right common femoral artery after progressively dilating over an Amplatz superstiff wire. An AL-2 catheter was used to direct a straight-tip exchange length wire across the native aortic valve into the left ventricle. This was exchanged out for a pigtail catheter and position was confirmed in the LV apex. Simultaneous LV and Ao pressures were recorded.  The pigtail catheter was exchanged for an Amplatz Extra-stiff wire in the LV apex.    BALLOON AORTIC VALVULOPLASTY:   Not performed  TRANSCATHETER HEART VALVE DEPLOYMENT:  An  Edwards Sapien 3 Ultra transcatheter heart valve (size 26 mm, model #9750TFX, serial #[id number]) was prepared and crimped per manufacturer's guidelines, and the proper orientation of the valve is confirmed on the [employer] delivery system. The valve was advanced through the introducer sheath using normal technique until in an appropriate position in the abdominal aorta beyond the sheath tip. The balloon was then retracted and using the fine-tuning wheel was centered on the valve. The valve was then advanced across the aortic arch using appropriate flexion of the catheter. The valve was carefully positioned across the aortic valve annulus. The Commander catheter was retracted using normal technique. Once final position of the valve has been confirmed by angiographic assessment, the valve is deployed while temporarily holding ventilation and during rapid ventricular pacing to maintain systolic blood pressure < 50 mmHg and pulse pressure < 10 mmHg. The balloon inflation is held for >3 seconds after reaching full deployment volume. Once the balloon has fully deflated the balloon is retracted into the ascending aorta and valve function is assessed using echocardiography. There is felt to be no paravalvular leak and no central aortic insufficiency.  The patient's hemodynamic recovery following valve deployment is good.  The deployment balloon and guidewire are both removed.    PROCEDURE COMPLETION:   The sheath was removed and femoral artery closure performed.  Protamine was administered once femoral arterial repair was complete. The temporary pacemaker, pigtail catheters and femoral sheaths were removed with manual pressure used for hemostasis.  A Mynx femoral closure device was utilized following removal of the diagnostic sheath in the left femoral artery.  The patient tolerated the procedure well and is transported to the cath lab recovery area in stable condition. There were no immediate intraoperative  complications. All sponge instrument and needle counts are verified correct at completion of the operation.   No blood products were administered during the operation.  The patient received a total of 40 mL of intravenous contrast during the procedure.   Gaye Pollack, MD 08/15/2019 10:13 AM

## 2019-08-15 NOTE — Op Note (Signed)
[facility]   MULTIDISCIPLINARY HEART VALVE TEAM   TAVR OPERATIVE NOTE   Date of Procedure:  08/15/2019  Preoperative Diagnosis: Severe Aortic Stenosis   Postoperative Diagnosis: Same   Procedure:    Transcatheter Aortic Valve Replacement - Percutaneous Transfemoral Approach  Edwards Sapien 3 Ultra THV (size 26 mm, model # 9750TFX, serial # [id number])   Co-Surgeons:           Gaye Pollack, MD and Sherren Mocha, MD  Anesthesiologist:  Midge Minium, MD  Echocardiographer:  Sanda Klein, MD  Pre-operative Echo Findings:  Severe aortic stenosis  Normal left ventricular systolic function  Post-operative Echo Findings:  No paravalvular leak  Normal/unchanged left ventricular systolic function  BRIEF CLINICAL NOTE AND INDICATIONS FOR SURGERY  79 year old male with type 2 diabetes, coronary artery disease, and severe stage D1 symptomatic aortic stenosis.  The patient underwent multidisciplinary evaluation and preoperative imaging studies and consideration of treatment options for severe aortic stenosis.  He was incidentally found to have a large renal mass with high suspicion for renal cell cancer.  Because of his need for nephrectomy along with other comorbid conditions, TAVR is preferred for rapid recovery compared with conventional surgery where he would have to wait for several months before proceeding with any further surgeries.  After review of all studies, we elected to treat his coronary disease medically and move forward with transfemoral TAVR for treatment of severe symptomatic aortic stenosis.  During the course of the patient's preoperative work up they have been evaluated comprehensively by a multidisciplinary team of specialists coordinated through the [facility] in the [facility].  They have been demonstrated to suffer from symptomatic severe aortic stenosis as noted above. The patient has  been counseled extensively as to the relative risks and benefits of all options for the treatment of severe aortic stenosis including long term medical therapy, conventional surgery for aortic valve replacement, and transcatheter aortic valve replacement.  The patient has been independently evaluated in formal cardiac surgical consultation by Dr Cyndia Bent, who deemed the patient appropriate for TAVR. Based upon review of all of the patient's preoperative diagnostic tests they are felt to be candidate for transcatheter aortic valve replacement using the transfemoral approach as an alternative to conventional surgery.    Following the decision to proceed with transcatheter aortic valve replacement, a discussion has been held regarding what types of management strategies would be attempted intraoperatively in the event of life-threatening complications, including whether or not the patient would be considered a candidate for the use of cardiopulmonary bypass and/or conversion to open sternotomy for attempted surgical intervention.  The patient has been advised of a variety of complications that might develop peculiar to this approach including but not limited to risks of death, stroke, paravalvular leak, aortic dissection or other major vascular complications, aortic annulus rupture, device embolization, cardiac rupture or perforation, acute myocardial infarction, arrhythmia, heart block or bradycardia requiring permanent pacemaker placement, congestive heart failure, respiratory failure, renal failure, pneumonia, infection, other late complications related to structural valve deterioration or migration, or other complications that might ultimately cause a temporary or permanent loss of functional independence or other long term morbidity.  The patient provides full informed consent for the procedure as described and all questions were answered preoperatively.  DETAILS OF THE OPERATIVE PROCEDURE  PREPARATION:   The  patient is brought to the operating room on the above mentioned date and central monitoring was established by the  anesthesia team including placement of a central venous catheter and radial arterial line. The patient is placed in the supine position on the operating table.  Intravenous antibiotics are administered. The patient is monitored closely throughout the procedure under conscious sedation.  Baseline transthoracic echocardiogram is performed. The patient's chest, abdomen, both groins, and both lower extremities are prepared and draped in a sterile manner. A time out procedure is performed.   PERIPHERAL ACCESS:   Using ultrasound guidance, femoral arterial and venous access is obtained with placement of 6 Fr sheaths on the left side.  A pigtail diagnostic catheter was passed through the femoral arterial sheath under fluoroscopic guidance into the aortic root.  A temporary transvenous pacemaker catheter was passed through the femoral venous sheath under fluoroscopic guidance into the right ventricle.  The pacemaker was tested to ensure stable lead placement and pacemaker capture. Aortic root angiography was performed in order to determine the optimal angiographic angle for valve deployment.  TRANSFEMORAL ACCESS:  A micropuncture technique is used to access the right femoral artery under fluoroscopic and ultrasound guidance.  2 Perclose devices are deployed at 10' and 2' positions to 'PreClose' the femoral artery. An 8 French sheath is placed and then an Amplatz Superstiff wire is advanced through the sheath. This is changed out for a 14 French transfemoral E-Sheath after progressively dilating over the Superstiff wire.  An AL-2 catheter was used to direct a straight-tip exchange length wire across the native aortic valve into the left ventricle. This was exchanged out for a pigtail catheter and position was confirmed in the LV apex. Simultaneous LV and Ao pressures were recorded.  The pigtail catheter  was exchanged for an Amplatz Extra-stiff wire in the LV apex.    BALLOON AORTIC VALVULOPLASTY:  Not performed  TRANSCATHETER HEART VALVE DEPLOYMENT:  An Edwards Sapien 3 Ultra transcatheter heart valve (size 26 mm) was prepared and crimped per manufacturer's guidelines, and the proper orientation of the valve is confirmed on the [employer] delivery system. The valve was advanced through the introducer sheath using normal technique until in an appropriate position in the abdominal aorta beyond the sheath tip. The balloon was then retracted and using the fine-tuning wheel was centered on the valve. The valve was then advanced across the aortic arch using appropriate flexion of the catheter. The valve was carefully positioned across the aortic valve annulus. The Commander catheter was retracted using normal technique. Once final position of the valve has been confirmed by angiographic assessment, the valve is deployed while temporarily holding ventilation and during rapid ventricular pacing to maintain systolic blood pressure < 50 mmHg and pulse pressure < 10 mmHg. The balloon inflation is held for >3 seconds after reaching full deployment volume. Once the balloon has fully deflated the balloon is retracted into the ascending aorta and valve function is assessed using echocardiography. The patient's hemodynamic recovery following valve deployment is good.  The deployment balloon and guidewire are both removed. Echo demostrated acceptable post-procedural gradients, stable mitral valve function, and no aortic insufficiency.    PROCEDURE COMPLETION:  The sheath was removed and femoral artery closure is performed using the 2 previously deployed Perclose devices.  The first suture broke, but hemostasis was obtained with the second suture. Protamine is administered once femoral arterial repair was complete. The site is clear with no evidence of bleeding or hematoma after the sutures are tightened. The  temporary pacemaker and pigtail catheters are removed. Mynx closure is used for contralateral femoral arterial hemostasis for  the 6 Fr sheath.  The patient tolerated the procedure well and is transported to the surgical intensive care in stable condition. There were no immediate intraoperative complications. All sponge instrument and needle counts are verified correct at completion of the operation.   The patient received a total of 40 mL of intravenous contrast during the procedure.   Sherren Mocha, MD 08/15/2019 9:49 AM

## 2019-08-15 NOTE — Progress Notes (Signed)
  Echocardiogram 2D Echocardiogram has been performed.  Alexis Griffith 08/15/2019, 9:22 AM

## 2019-08-15 NOTE — Consult Note (Signed)
[facility]   MULTIDISCIPLINARY HEART VALVE TEAM  Date:  08/15/2019   ID:  Alexis Griffith, DOB 04/13/1940, MRN [id number]  PCP:  Haywood Pao, MD   CC: Shortness of breath   HISTORY OF PRESENT ILLNESS: Alexis Griffith is a 79 y.o. male who presents for evaluation of severe aortic stenosis, referred by Dr Acie Fredrickson. He has already been evaluated by Dr Cyndia Bent and his case reviewed by the Multidisciplinary Heart Team.  80 yo male with HTN, Type II DM, OSA, hx TIA, and multivessel CAD, now with progressive and severe aortic stenosis. He has complained of chest pressure and CTA showed multivessel calcific CAD. He was then referred for cardiac catheterization demonstrating a mean transaortic valve gradient of 37 mmHg and confirming CAD with total occlusion of the RCA, and moderate 70% diffuse LAD stenosis. The circumflex had 75% proximal and mid vessel stenosis. Echo confirmed severe AS with a mean gradient of 43 mmHg and AVA 0.8 square cm. LVEF is 55-60%. TAVR CT's incidentally showed a large renal mass consistent with renal cell CA. After discussion of all treatment options, including CABG/AVR, palliative med Rx, and TAVR with medical therapy for CAD, he has elected to proceed with TAVR followed by appropriate therapy for his renal cell Ca.   The patient has had some improvement in exercise capacity since he started isosorbide. However, he remains fairly sedentary. No orthopnea, PND, or syncope.     Past Medical History:  Diagnosis Date  . Anxiety   . CAD (coronary artery disease)   . Diabetes mellitus without complication ([place])   . Hyperlipidemia   . Hypertension   . PVC (premature ventricular contraction)   . Severe aortic stenosis   . Sinus bradycardia on ECG   . Sleep apnea   . 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 Wilburn Cornelia.  . Stroke ([place])   . TIA (transient ischemic attack)     Current Facility-Administered Medications    Medication Dose Route Frequency Provider Last Rate Last Admin  . [START ON 08/16/2019] 0.9 %  sodium chloride infusion   Intravenous Continuous Angelena Form R, PA-C      . 0.9 % irrigation (POUR BTL)    PRN Sherren Mocha, MD   1,000 mL at 08/15/19 0719  . chlorhexidine (HIBICLENS) 4 % liquid 2 application  30 mL Topical UD Eileen Stanford, PA-C      . chlorhexidine (HIBICLENS) 4 % liquid 4 application  60 mL Topical Once Eileen Stanford, PA-C       And  . [START ON 08/16/2019] chlorhexidine (HIBICLENS) 4 % liquid 4 application  60 mL Topical Once Eileen Stanford, PA-C      . [START ON 08/16/2019] chlorhexidine (PERIDEX) 0.12 % solution 15 mL  15 mL Mouth/Throat Once Eileen Stanford, PA-C      . heparin 30,000 units/NS 1000 mL solution for CELLSAVER   Other To OR Sherren Mocha, MD      . heparin 6,000 Units in sodium chloride 0.9 % 500 mL irrigation    PRN Sherren Mocha, MD   500 mL at 08/15/19 0718  . levofloxacin (LEVAQUIN) IVPB 500 mg  500 mg Intravenous To OR Sherren Mocha, MD   500 mg at 08/15/19 0745  . lidocaine (XYLOCAINE) 1 % (with pres) injection    PRN Sherren Mocha, MD   30 mL at 08/15/19 0722  . magnesium sulfate (IV Push/IM) injection 40 mEq  40 mEq Other To OR Sherren Mocha, MD      . norepinephrine (LEVOPHED) 4mg  in 273mL premix infusion  0-10 mcg/min Intravenous To OR Sherren Mocha, MD      . potassium chloride injection 80 mEq  80 mEq Other To OR Sherren Mocha, MD      . vancomycin Alcus Dad) IVPB 1500 mg/300 mL  1,500 mg Intravenous To OR Sherren Mocha, MD   1,500 mg at 08/15/19 0701   Facility-Administered Medications Ordered in Other Encounters  Medication Dose Route Frequency Provider Last Rate Last Admin  . dexmedetomidine (PRECEDEX) injection   Intravenous Anesthesia Intra-op Inda Coke, CRNA   90.7 mcg at 08/15/19 0730  . fentaNYL (SUBLIMAZE) injection   Intravenous Anesthesia Intra-op Inda Coke, CRNA   50 mcg at 08/15/19  7035  . lactated ringers infusion   Intravenous Continuous PRN Inda Coke, CRNA   New Bag at 08/15/19 0701  . lactated ringers infusion   Intravenous Continuous PRN Inda Coke, CRNA   New Bag at 08/15/19 [phone]  . nitroGLYCERIN 50 mg in dextrose 5 % 250 mL (0.2 mg/mL) infusion   Intravenous Continuous PRN Inda Coke, CRNA 1.5 mL/hr at 08/15/19 0730 5 mcg/min at 08/15/19 0730  . propofol (DIPRIVAN) 10 mg/mL bolus/IV push   Intravenous Anesthesia Intra-op Inda Coke, CRNA   20 mg at 08/15/19 0806  . propofol (DIPRIVAN) 500 MG/50ML infusion   Intravenous Continuous PRN Inda Coke, CRNA [phone] mL/hr at 08/15/19 0806 20 mcg/kg/min at 08/15/19 0806    ALLERGIES:   Macrodantin [nitrofurantoin], Sulfa antibiotics, Lisinopril, and Penicillins   SOCIAL HISTORY:  The patient  reports that he quit smoking about 44 years ago. He has never used smokeless tobacco. He reports that he does not drink alcohol and does not use drugs.   FAMILY HISTORY:  The patient's family history includes Heart attack in his father and mother; Heart disease in his father and mother.   REVIEW OF SYSTEMS:  Positive for fatigue.   All other systems are reviewed and negative.   PHYSICAL EXAM: VS:  BP (!) 170/43   Pulse 63   Temp 98 F (36.7 C) (Oral)   Resp 17   Ht 5\' 10"  (1.778 m)   Wt 90.7 kg   SpO2 97%   BMI 28.70 kg/m  , BMI Body mass index is 28.7 kg/m. GEN: Well nourished, well developed, in no acute distress HEENT: normal Neck: No JVD. carotids 2+ without bruits or masses Cardiac: The heart is RRR with a 3/6 harsh late peaking systolic murmur at the RUSB. No edema. Pedal pulses 2+ = bilaterally  Respiratory:  clear to auscultation bilaterally GI: soft, nontender, nondistended, + BS MS: no deformity or atrophy Skin: warm and dry, no rash Neuro:  Strength and sensation are intact Psych: euthymic mood, full affect  RECENT LABS: 08/11/2019: ALT 20; B Natriuretic Peptide 238.5; BUN 21; Creatinine,  Ser 1.18; Hemoglobin 14.1; Platelets 144; Potassium 3.6; Sodium 135  06/14/2019: Chol/HDL Ratio 4.0; Cholesterol, Total 139; HDL 35; LDL Chol Calc (NIH) 80; Triglycerides 135   Estimated Creatinine Clearance: 58.5 mL/min (by C-G formula based on SCr of 1.18 mg/dL).   Wt Readings from Last 3 Encounters:  08/15/19 90.7 kg  08/11/19 91.3 kg  08/09/19 90.8 kg     CARDIAC STUDIES:  Echo:  IMPRESSIONS    1. Normal LV systolic function; grade 1 diastolic dysfunction; mild LVH;  calcified aortic valve with severe AS (mean gradient 43  mmHg) and mild AI;  mild MR.  2. Left ventricular ejection fraction, by estimation, is 55 to 60%. The  left ventricle has normal function. The left ventricle has no regional  wall motion abnormalities. There is mild left ventricular hypertrophy.  Left ventricular diastolic parameters  are consistent with Grade I diastolic dysfunction (impaired relaxation).  3. Right ventricular systolic function is normal. The right ventricular  size is normal.  4. The mitral valve is normal in structure. Mild mitral valve  regurgitation. No evidence of mitral stenosis.  5. The aortic valve is normal in structure. Aortic valve regurgitation is  mild. Severe aortic valve stenosis.  6. The inferior vena cava is normal in size with greater than 50%  respiratory variability, suggesting right atrial pressure of 3 mmHg.    Cardiac Cath:  Conclusion    Ost LM lesion is 30% stenosed.  Prox LAD to Mid LAD lesion is 70% stenosed with 70% stenosed side branch in 2nd Diag. Both vessels positive by CT FFR  Mid LAD to Dist LAD lesion is 40% stenosed. Dist LAD lesion is 75% stenosed.  Prox Cx lesion is 45% stenosed. Prox Cx to Mid Cx lesion is 75% stenosed. RFR positive is 0.87  Mid Cx lesion is 5% stenosed with 50% stenosed side branch in 3rd Mrg. No significant drop in RFR reading beyond this lesion.  Ost RCA to Dist RCA lesion is 100% stenosed. Significant left to  right collaterals fill the PDA and PL system.  RPDA lesion is 50% stenosed.  --------------------------------------  The left ventricular systolic function is normal. The left ventricular ejection fraction is 55-65% by visual estimate. Regional wall motion knowledged and inferior wall.  LV end diastolic pressure is moderately elevated.  There is ~SEVERE AORTIC VALVE STENOSIS.    SUMMARY  Severe three-vessel disease: 100% CTO of ostial RCA (left to right collaterals fill PDA and PL), long 70% calcified proximal to mid LAD (positive by CT FFR), segmental ~75% proximal-mid LCx (positive by RFR)  Borderline SEVERE AORTIC STENOSIS-mean gradient estimated 37 mmHg by pullback.  Normal LVEF with basal inferior hypokinesis seen on echocardiogram and LV gram.   Plan will be to consult CVTS/Cardiology Valve Clinic to consider options between two-vessel PCI and TAVR versus CABG/AVR. He is not having resting chest pain, will discharge home today on Imdur pending Valve Clinic evaluation.. Coronary Diagrams  Diagnostic Dominance: Right  Intervention    CTA Heart: FINDINGS: Aortic Valve: Calcified tri leaflet with restricted leaflet motion  Aorta: Normal arch vessels no aneurysm no coarctation  Sinotubular Junction: 24 mm heavily calcified  Ascending Thoracic Aorta: 29 mm  Aortic Arch: 26 mm  Descending Thoracic Aorta: 23 mm  Sinus of Valsalva Measurements:  Non-coronary: 31 mm  Right - coronary: 28 mm  Left - coronary: 29.2 mm  Coronary Artery Height above Annulus:  Left Main: 13.4 mm above annulus  Right Coronary: 14.7 mm above annulus but occluded  Virtual Basal Annulus Measurements:  Maximum/Minimum Diameter: 19.5 mm x 28.9 mm  Perimeter: 81 mm  Area: 512 mm2  Coronary Arteries: Sufficient height above annulus for deployment RCA occluded fills by collaterals from left system  Optimum Fluoroscopic Angle for Delivery: LAO 3 Caudal 3  degrees  IMPRESSION: 1. Calcified tri leaflet AV with annular area of 512 mm 2 suitable for a 26 mm Sapien 3 valve  2. Coronary arteries sufficient height above annulus for deployment Native RCA occluded  3.  Normal aortic root 2.9 cm with no aneurysm  4.  Optimum angiographic angle for deployment LAO 3 Caudal 3 degrees  5.  Significant calcification of the STJ  CTA ABdomen/Pelvis: AORTA:  Minimal Aortic Diameter-10 x 10 mm  Severity of Aortic Calcification-severe  RIGHT PELVIS:  Right Common Iliac Artery -  Minimal Diameter-7.3 x 6.1 mm  Tortuosity-mild  Calcification-moderate  Right External Iliac Artery -  Minimal Diameter-7.2 x 7.1 mm  Tortuosity - mild  Calcification-none  Right Common Femoral Artery -  Minimal Diameter-6.5 x 6.4 mm  Tortuosity - mild  Calcification-mild  LEFT PELVIS:  Left Common Iliac Artery -  Minimal Diameter-7.6 x 6.7 mm  Tortuosity - mild  Calcification-moderate  Left External Iliac Artery -  Minimal Diameter-7.7 x 6.9 mm  Tortuosity - mild  Calcification-none  Left Common Femoral Artery -  Minimal Diameter-6.6 x 4.8 mm  Tortuosity - mild  Calcification-mild  Review of the MIP images confirms the above findings.  IMPRESSION: 1. Vascular findings and measurements pertinent to potential TAVR procedure, as detailed above. 2. Severe thickening calcification of the aortic valve, compatible with reported clinical history of severe aortic stenosis. 3. 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 This appears likely encapsulated within Gerota's fascia, does not invade the left renal vein, and is not associated with lymphadenopathy. However, there are several small pulmonary nodules noted throughout the lungs bilaterally. The possibility of metastatic disease to the lungs is not entirely  excluded. The largest of these pulmonary nodules is in the right lower lobe measuring 1.3 x 0.9 cm. Urologic consultation is recommended, and evaluation with PET-CT is suggested in the near future. 4. Cholelithiasis without evidence of acute cholecystitis at this time. 5. Additional incidental findings, as above.  STS RISK CALCULATOR: Procedure: AVR + CAB   Risk of Mortality:  2.477%  Renal Failure:  3.185%  Permanent Stroke:  2.195%  Prolonged Ventilation:  8.996%  DSW Infection:  0.186%  Reoperation:  3.739%  Morbidity or Mortality:  14.762%  Short Length of Stay:  29.486%  Long Length of Stay:  6.738%   ASSESSMENT AND PLAN: 100.  79 yo male with newly diagnosed renal cell carcinoma and severe aortic stenosis with NYHA functional class II symptoms of exertional fatigue and chest discomfort. Imaging studies confirm severe AS with mean gradient 43 mmHg, and a severely calcified, restricted aortic valve. Considering his need for a procedure with rapid recovery, TAVR seems like a reasonable treatment option. His chest pain has atypical features and he is certainly not limited by severe angina.   Following the decision to proceed with transcatheter aortic valve replacement, a discussion has been held regarding what types of management strategies would be attempted intraoperatively in the event of life-threatening complications, including whether or not the patient would be considered a candidate for the use of cardiopulmonary bypass and/or conversion to open sternotomy for attempted surgical intervention.  The patient has been advised of a variety of complications that might develop including but not limited to risks of death, stroke, paravalvular leak, aortic dissection or other major vascular complications, aortic annulus rupture, device embolization, cardiac rupture or perforation, mitral regurgitation, acute myocardial infarction, arrhythmia, heart block or bradycardia requiring  permanent pacemaker placement, congestive heart failure, respiratory failure, renal failure, pneumonia, infection, other late complications related to structural valve deterioration or migration, or other complications that might ultimately cause a temporary or permanent loss of functional independence or other long term morbidity.  The patient provides  full informed consent for the procedure as described and all questions were answered.  Deatra James 08/15/2019 8:16 AM     [facility] [address] [place] [address]  [phone] (office) [phone] (fax)

## 2019-08-15 NOTE — Transfer of Care (Signed)
Immediate Anesthesia Transfer of Care Note  Patient: Alexis Griffith  Procedure(s) Performed: TRANSCATHETER AORTIC VALVE REPLACEMENT, TRANSFEMORAL using [employer] SAPIEN 3 Ultra 26 mm THV. (N/A Chest) TRANSESOPHAGEAL ECHOCARDIOGRAM (TEE) (N/A )  Patient Location: PACU  Anesthesia Type:MAC  Level of Consciousness: awake, alert  and oriented  Airway & Oxygen Therapy: Patient Spontanous Breathing and Patient connected to nasal cannula oxygen  Post-op Assessment: Report given to RN and Post -op Vital signs reviewed and stable  Post vital signs: Reviewed and stable  Last Vitals:  Vitals Value Taken Time  BP 144/49 08/15/19 1003  Temp 36.7 C 08/15/19 0948  Pulse 58 08/15/19 1007  Resp 12 08/15/19 0948  SpO2 96 % 08/15/19 1007  Vitals shown include unvalidated device data.  Last Pain:  Vitals:   08/15/19 0948  TempSrc: Temporal  PainSc: 0-No pain         Complications: No complications documented.

## 2019-08-15 NOTE — Discharge Instructions (Signed)
ACTIVITY AND EXERCISE °• Daily activity and exercise are an important part of your recovery. People recover at different rates depending on their general health and type of valve procedure. °• Most people recovering from TAVR feel better relatively quickly  °• No lifting, pushing, pulling more than 10 pounds (examples to avoid: groceries, vacuuming, gardening, golfing): °            - For one week with a procedure through the groin. °            - For six weeks for procedures through the chest wall or neck. °NOTE: You will typically see one of our providers 7-14 days after your procedure to discuss WHEN TO RESUME the above activities.  °  °  °DRIVING °• Do not drive until you are seen for follow up and cleared by a provider. Generally, we ask patient to not drive for 1 week after their procedure. °• If you have been told by your doctor in the past that you may not drive, you must talk with him/her before you begin driving again. °  °DRESSING °• Groin site: you may leave the clear dressing over the site for up to one week or until it falls off. °  °HYGIENE °• If you had a femoral (leg) procedure, you may take a shower when you return home. After the shower, pat the site dry. Do NOT use powder, oils or lotions in your groin area until the site has completely healed. °• If you had a chest procedure, you may shower when you return home unless specifically instructed not to by your discharging practitioner. °            - DO NOT scrub incision; pat dry with a towel. °            - DO NOT apply any lotions, oils, powders to the incision. °            - No tub baths / swimming for at least 2 weeks. °• If you notice any fevers, chills, increased pain, swelling, bleeding or pus, please contact your doctor. °  °ADDITIONAL INFORMATION °• If you are going to have an upcoming dental procedure, please contact our office as you will require antibiotics ahead of time to prevent infection on your heart valve.  ° ° °If you have any  questions or concerns you can call the structural heart phone during normal business hours 8am-4pm. If you have an urgent need after hours or weekends please call [phone] to talk to the on call provider for general cardiology. If you have an emergency that requires immediate attention, please call 911.  ° ° °After TAVR Checklist ° °Check  Test Description  ° Follow up appointment in 1-2 weeks  You will see our structural heart physician assistant, Katie Baylei Siebels. Your incision sites will be checked and you will be cleared to drive and resume all normal activities if you are doing well.    ° 1 month echo and follow up  You will have an echo to check on your new heart valve and be seen back in the office by Katie Tomika Eckles. Many times the echo is not read by your appointment time, but Katie will call you later that day or the following day to report your results.  ° Follow up with your primary cardiologist You will need to be seen by your primary cardiologist in the following 3-6 months after your 1 month appointment in the valve   clinic. Often times your Plavix or Aspirin will be discontinued during this time, but this is decided on a case by case basis.   ° 1 year echo and follow up You will have another echo to check on your heart valve after 1 year and be seen back in the office by Katie Loron Weimer. This your last structural heart visit.  ° Bacterial endocarditis prophylaxis  You will have to take antibiotics for the rest of your life before all dental procedures (even teeth cleanings) to protect your heart valve. Antibiotics are also required before some surgeries. Please check with your cardiologist before scheduling any surgeries. Also, please make sure to tell us if you have a penicillin allergy as you will require an alternative antibiotic.   ° ° °

## 2019-08-15 NOTE — Anesthesia Procedure Notes (Signed)
Arterial Line Insertion Start/End6/15/2021 7:00 AM, 08/15/2019 7:05 AM Performed by: Inda Coke, CRNA, CRNA  Patient location: Pre-op. Preanesthetic checklist: patient identified, IV checked, site marked, risks and benefits discussed, surgical consent, monitors and equipment checked, pre-op evaluation, timeout performed and anesthesia consent Lidocaine 1% used for infiltration Right, radial was placed Catheter size: 20 G Hand hygiene performed  and maximum sterile barriers used  Allen's test indicative of satisfactory collateral circulation Attempts: 1 Procedure performed without using ultrasound guided technique. Following insertion, dressing applied and Biopatch. Post procedure assessment: normal  Patient tolerated the procedure well with no immediate complications.

## 2019-08-15 NOTE — Anesthesia Procedure Notes (Signed)
Procedure Name: MAC Date/Time: 08/15/2019 7:30 AM Performed by: Inda Coke, CRNA Pre-anesthesia Checklist: Patient identified, Emergency Drugs available, Suction available, Timeout performed and Patient being monitored Patient Re-evaluated:Patient Re-evaluated prior to induction Oxygen Delivery Method: Simple face mask Dental Injury: Teeth and Oropharynx as per pre-operative assessment

## 2019-08-15 NOTE — Interval H&P Note (Signed)
History and Physical Interval Note:  08/15/2019 6:50 AM  Alexis Griffith  has presented today for surgery, with the diagnosis of aortic stenosis.  The various methods of treatment have been discussed with the patient and family. After consideration of risks, benefits and other options for treatment, the patient has consented to  Procedure(s): TRANSCATHETER AORTIC VALVE REPLACEMENT, TRANSFEMORAL (N/A) TRANSESOPHAGEAL ECHOCARDIOGRAM (TEE) (N/A) as a surgical intervention.  The patient's history has been reviewed, patient examined, no change in status, stable for surgery.  I have reviewed the patient's chart and labs.  Questions were answered to the patient's satisfaction.     Gaye Pollack

## 2019-08-15 NOTE — Progress Notes (Signed)
  [place] VALVE TEAM  Patient doing well s/p TAVR. He is hemodynamically stable but BP is elevated on cleviprex. Will resume home meds, except Lopressor. Groin sites stable. ECG with sinus brady and first deg AV block but no high grade block. Once of Cleviprex, will transfer to 4E. Plan for early ambulation after bedrest completed and hopeful discharge over the next 24-48 hours.   Angelena Form PA-C  [facility]  Pager [phone]

## 2019-08-16 ENCOUNTER — Inpatient Hospital Stay (HOSPITAL_COMMUNITY): Payer: Medicare Other

## 2019-08-16 DIAGNOSIS — Z952 Presence of prosthetic heart valve: Secondary | ICD-10-CM

## 2019-08-16 DIAGNOSIS — I639 Cerebral infarction, unspecified: Secondary | ICD-10-CM

## 2019-08-16 DIAGNOSIS — I5033 Acute on chronic diastolic (congestive) heart failure: Secondary | ICD-10-CM

## 2019-08-16 LAB — CBC
HCT: 39.7 % (ref 39.0–52.0)
Hemoglobin: 13.3 g/dL (ref 13.0–17.0)
MCH: 27.8 pg (ref 26.0–34.0)
MCHC: 33.5 g/dL (ref 30.0–36.0)
MCV: 82.9 fL (ref 80.0–100.0)
Platelets: 126 10*3/uL — ABNORMAL LOW (ref 150–400)
RBC: 4.79 MIL/uL (ref 4.22–5.81)
RDW: 13.1 % (ref 11.5–15.5)
WBC: 9 10*3/uL (ref 4.0–10.5)
nRBC: 0 % (ref 0.0–0.2)

## 2019-08-16 LAB — BASIC METABOLIC PANEL
Anion gap: 8 (ref 5–15)
BUN: 15 mg/dL (ref 8–23)
CO2: 25 mmol/L (ref 22–32)
Calcium: 8.9 mg/dL (ref 8.9–10.3)
Chloride: 102 mmol/L (ref 98–111)
Creatinine, Ser: 1.04 mg/dL (ref 0.61–1.24)
GFR calc Af Amer: >60 mL/min (ref >60)
GFR calc non Af Amer: >60 mL/min (ref >60)
Glucose, Bld: 144 mg/dL — ABNORMAL HIGH (ref 70–99)
Potassium: 3.8 mmol/L (ref 3.5–5.1)
Sodium: 135 mmol/L (ref 135–145)

## 2019-08-16 LAB — ECHOCARDIOGRAM LIMITED
Height: 70 in
Weight: 3121.71 oz

## 2019-08-16 LAB — GLUCOSE, CAPILLARY
Glucose-Capillary: 158 mg/dL — ABNORMAL HIGH (ref 70–99)
Glucose-Capillary: 188 mg/dL — ABNORMAL HIGH (ref 70–99)
Glucose-Capillary: 210 mg/dL — ABNORMAL HIGH (ref 70–99)
Glucose-Capillary: 201 mg/dL — ABNORMAL HIGH (ref 70–99)

## 2019-08-16 LAB — MAGNESIUM: Magnesium: 1.3 mg/dL — ABNORMAL LOW (ref 1.7–2.4)

## 2019-08-16 MED ORDER — METOPROLOL TARTRATE 50 MG PO TABS
50.0000 mg | ORAL_TABLET | Freq: Three times a day (TID) | ORAL | Status: DC
  Administered 2019-08-16 – 2019-08-18 (×6): 50 mg via ORAL
  Filled 2019-08-16 (×6): qty 1

## 2019-08-16 MED ORDER — STROKE: EARLY STAGES OF RECOVERY BOOK
Freq: Once | Status: AC
  Filled 2019-08-16: qty 1

## 2019-08-16 MED ORDER — ZOLPIDEM TARTRATE 5 MG PO TABS
5.0000 mg | ORAL_TABLET | Freq: Every evening | ORAL | Status: DC | PRN
  Administered 2019-08-17: 5 mg via ORAL
  Filled 2019-08-16: qty 1

## 2019-08-16 MED ORDER — LOSARTAN POTASSIUM-HCTZ 100-25 MG PO TABS: 1.0000 | ORAL_TABLET | Freq: Every day | ORAL | Status: DC

## 2019-08-16 MED ORDER — HYDROCHLOROTHIAZIDE 25 MG PO TABS
25.0000 mg | ORAL_TABLET | Freq: Every day | ORAL | Status: DC
  Administered 2019-08-16 – 2019-08-18 (×3): 25 mg via ORAL
  Filled 2019-08-16 (×3): qty 1

## 2019-08-16 MED ORDER — LOSARTAN POTASSIUM 50 MG PO TABS
100.0000 mg | ORAL_TABLET | Freq: Every day | ORAL | Status: DC
  Administered 2019-08-16 – 2019-08-18 (×3): 100 mg via ORAL
  Filled 2019-08-16 (×3): qty 2

## 2019-08-16 MED ORDER — METOPROLOL TARTRATE 50 MG PO TABS
50.0000 mg | ORAL_TABLET | Freq: Three times a day (TID) | ORAL | Status: DC
  Filled 2019-08-16: qty 1

## 2019-08-16 MED ORDER — METOPROLOL TARTRATE 25 MG PO TABS
25.0000 mg | ORAL_TABLET | Freq: Once | ORAL | Status: AC
  Administered 2019-08-16: 25 mg via ORAL

## 2019-08-16 MED ORDER — CHLORHEXIDINE GLUCONATE CLOTH 2 % EX PADS
6.0000 | MEDICATED_PAD | Freq: Every day | CUTANEOUS | Status: DC
  Administered 2019-08-17 – 2019-08-30 (×12): 6 via TOPICAL

## 2019-08-16 MED ORDER — IOHEXOL 350 MG/ML SOLN
75.0000 mL | Freq: Once | INTRAVENOUS | Status: AC | PRN
  Administered 2019-08-16: 75 mL via INTRAVENOUS

## 2019-08-16 MED FILL — Potassium Chloride Inj 2 mEq/ML: INTRAVENOUS | Qty: 40 | Status: AC

## 2019-08-16 MED FILL — Heparin Sodium (Porcine) Inj 1000 Unit/ML: INTRAMUSCULAR | Qty: 30 | Status: AC

## 2019-08-16 MED FILL — Magnesium Sulfate Inj 50%: INTRAMUSCULAR | Qty: 10 | Status: AC

## 2019-08-16 NOTE — Significant Event (Addendum)
Rapid Response Event Note  Overview: Time Called: 0946 Arrival Time: 0949 Event Type: Hypotension, Neurologic  Per Staff patient became weak and disoriented and fell back on the bed when he stood up to void earlier this am.   About 0930 Cardiac Rehab assisted him to the bathroom.  When he initially stood up his BP dropped to 99/57  HR 90s, after a few minutes of standing his BP was 100/46.  They then assisted him to the bathroom where he suddenly became disoriented and very "weak"  They assisted him to sitting then assisted him to the bed.  Initial Focused Assessment: Upon my arrival he is lying in the bed he is alert but disoriented.  He remember going to the bathroom but does not realize he is in the bed or how he got back to bed.   NIHSS 1, he stated his age was "67". He has difficulty lifting his right leg because of pain but does not have any weakness. He does endorse a mild HA  As he is lying flat his BP improves 117/53, 134/54  SR 80s RR 20-23  O2 sats 92-94% Lung sounds are clear  Interventions: Patient improved quickly after lying flat in the bed After orienting him to his age he was able to state his correct age.  NIHSS 0  Plan of Care (if not transferred): MRI    RN to call if has additional episodes.   Event Summary: Name of Physician Notified: Nell Range PA at [phone]    at    Outcome: Stayed in room and stabalized  Event End Time: [place]  Raliegh Ip

## 2019-08-16 NOTE — Progress Notes (Addendum)
During the night patients BP gradually increased. Given Norvasc but ineffective. At 2144 BP 177/79. On call physician notified of patients condition. Pt complained of headache.  Tylenol was previously given but no change in pain.  Ordered to start 25 mg Metoprolol po TID. At 2300 Metoprolol 25 mg was given. One hour later BP 158/60.  No change in headache pain level of 5.  Lonia Blood, RN

## 2019-08-16 NOTE — Consult Note (Signed)
Urology Consult Note   Requesting Attending Physician:  Sherren Mocha, MD Service Providing Consult: Urology  Consulting Attending: Dr. Raynelle Bring   Reason for Consult: Urinary retention, gross hematuria  HPI: Alexis Griffith is seen in consultation for reasons noted above at the request of Sherren Mocha, MD for evaluation of urinary retention, gross hematuria.  This is a 79 y.o. male with prior history of stroke, sleep apnea, bradycardia, severe aortic stenosis status post TAVR on 08/15/2019, hypertension, hyperlipidemia, diabetes, erectile dysfunction, lower urinary tract symptoms, large left renal mass.  Urology was consulted after patient developed urinary retention during this hospitalization.  Patient reports a longstanding history of lower urinary tract symptoms which has been attributed to benign prostatic hypertrophy.  He did have a prostate biopsy remotely.  He has been previously followed by Dr. Kathie Rhodes, most recently for a large left renal mass measuring approximately 8 cm.  He does have a follow-up appointment with Dr. Alexis Frock in approximately 2 weeks.  Patient has a history of severe aortic stenosis and underwent planned TAVR on 08/15/2019.  He has been started on both aspirin and Plavix.  Originally with inability to urinate and required in and out catheterization.  Developed gross hematuria yesterday after small volume void.  Earlier this morning, despite feeling relatively unwell, he felt compelled to urinate and with the assistance of a walker attempted to ambulate to the bathroom.  He became weak and disoriented and required assistance to get back to bed.  Over the course of the day, inability to void despite urge and suprapubic pain.  Ultimately had a head MRI which demonstrated a small acute infarct in the left medial frontal cortex, neurology has seen the patient -MRI findings thought to be incidental and periprocedural.   In and out catheterization was  previously attempted, however now with blood at the urethral meatus and gross hematuria, urology was called to assist with Foley catheter placement after bladder scan revealed volume of 900 mL.  Patient reported resolution of pain after catheter was placed.  Patient had more discomfort with catheter placement then with hand irrigation at the bedside.  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 Burt Knack & Dr. 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 Wilburn Cornelia.   Stroke [facility])    TIA (transient ischemic attack)     Past Surgical History:  Past Surgical History:  Procedure Laterality Date   CATARACT EXTRACTION, BILATERAL  10/2015, and 11/2015   with adding  TORIC lenses bilaterally    INTRAVASCULAR PRESSURE WIRE/FFR STUDY N/A 07/07/2019   Procedure: INTRAVASCULAR PRESSURE WIRE/FFR STUDY;  Surgeon: [name], MD;  Location: [place] [facility];  Service: Cardiovascular;  Laterality: N/A;   LEFT HEART CATH AND CORONARY ANGIOGRAPHY N/A  07/07/2019   Procedure: LEFT HEART CATH AND CORONARY ANGIOGRAPHY;  Surgeon: [name], MD;  Location: [place] [facility];  Service: Cardiovascular;  Laterality: N/A;   SQUAMOUS CELL CARCINOMA EXCISION     tongue   TEE WITHOUT CARDIOVERSION N/A 08/15/2019   Procedure:  TRANSESOPHAGEAL ECHOCARDIOGRAM (TEE);  Surgeon: Sherren Mocha, MD;  Location: [place];  Service: Open Heart Surgery;  Laterality: N/A;   TRANSCATHETER AORTIC VALVE REPLACEMENT, TRANSFEMORAL N/A 08/15/2019   Procedure: TRANSCATHETER AORTIC VALVE REPLACEMENT, TRANSFEMORAL using Edwards [employer] SAPIEN 3 Ultra 26 mm THV.;  Surgeon: Sherren Mocha, MD;  Location: [place];  Service: Open Heart Surgery;  Laterality: N/A;    Medication: Current Facility-Administered Medications  Medication Dose Route Frequency Provider Last Rate Last Admin   0.9 %  sodium chloride infusion  250 mL Intravenous PRN Eileen Stanford, PA-C       acetaminophen (TYLENOL) tablet 650 mg  650 mg Oral Q6H PRN Eileen Stanford, PA-C   650 mg at 08/15/19 2153   Or   acetaminophen (TYLENOL) suppository 650 mg  650 mg Rectal Q6H PRN Eileen Stanford, PA-C       aspirin chewable tablet 81 mg  81 mg Oral Daily Eileen Stanford, PA-C   81 mg at 08/16/19 2505   bismuth subsalicylate (PEPTO BISMOL) 262 MG/15ML suspension 30 mL  30 mL Oral Q4H PRN Sueanne Margarita, MD   30 mL at 08/15/19 2338   [START ON 08/17/2019] Chlorhexidine Gluconate Cloth 2 % PADS 6 each  6 each Topical Daily Sherren Mocha, MD       clonazePAM Bobbye [place]) tablet 0.5 mg  0.5 mg Oral QID Eileen Stanford, PA-C   0.5 mg at 08/16/19 2053   clopidogrel (PLAVIX) tablet 75 mg  75 mg Oral Q breakfast Eileen Stanford, PA-C   75 mg at 08/16/19 0831   doxazosin (CARDURA) tablet 4 mg  4 mg Oral Daily Eileen Stanford, PA-C   4 mg at 08/16/19 0830   losartan (COZAAR) tablet 100 mg  100 mg Oral Daily Sherren Mocha, MD   100 mg at 08/16/19 0830   And   hydrochlorothiazide (HYDRODIURIL) tablet 25 mg  25 mg Oral Daily Sherren Mocha, MD   25 mg at 08/16/19 0831   insulin aspart (novoLOG) injection 0-24 Units  0-24 Units Subcutaneous TID AC & HS Eileen Stanford, PA-C   8 Units at 08/16/19 2204   isosorbide mononitrate (IMDUR) 24 hr tablet  30 mg  30 mg Oral Daily Eileen Stanford, PA-C   30 mg at 08/16/19 0830   metoprolol tartrate (LOPRESSOR) tablet 50 mg  50 mg Oral TID Sueanne Margarita, MD   50 mg at 08/16/19 2053   morphine 2 MG/ML injection 1-4 mg  1-4 mg Intravenous Q1H PRN Eileen Stanford, PA-C   2 mg at 08/16/19 0221   nitroGLYCERIN 50 mg in dextrose 5 % 250 mL (0.2 mg/mL) infusion  0-100 mcg/min Intravenous Titrated Eileen Stanford, PA-C       ondansetron [facility]) injection 4 mg  4 mg Intravenous Q6H PRN Eileen Stanford, PA-C       oxyCODONE (Oxy IR/ROXICODONE) immediate release tablet 5-10 mg  5-10 mg Oral Q3H PRN Eileen Stanford, PA-C   5 mg at 08/16/19 0557   phenylephrine (NEOSYNEPHRINE) 20-0.9 MG/250ML-% infusion  0-100 mcg/min Intravenous Titrated Eileen Stanford, PA-C       rosuvastatin (CRESTOR) tablet 5 mg  5 mg  Oral Daily Eileen Stanford, PA-C   5 mg at 08/16/19 0830   sodium chloride flush (NS) 0.9 % injection 3 mL  3 mL Intravenous Q12H Eileen Stanford, PA-C   3 mL at 08/16/19 2054   sodium chloride flush (NS) 0.9 % injection 3 mL  3 mL Intravenous PRN Eileen Stanford, PA-C       traMADol Veatrice Bourbon) tablet 50-100 mg  50-100 mg Oral Q4H PRN Eileen Stanford, PA-C   50 mg at 08/16/19 2054   zolpidem (AMBIEN) tablet 5 mg  5 mg Oral QHS PRN Eileen Stanford, PA-C        Allergies: Allergies  Allergen Reactions   Macrodantin [Nitrofurantoin] Anaphylaxis    blocked kidneys    Sulfa Antibiotics Rash   Lisinopril Cough   Penicillins     Unknown    Social History: Social History   Tobacco Use   Smoking status: Former Smoker    Quit date: 06/25/1975    Years since quitting: 44.1   Smokeless tobacco: Never Used  Vaping Use   Vaping Use: Never used  Substance Use Topics   Alcohol use: No   Drug use: No    Family History Family History  Problem Relation Age of Onset   Heart disease Mother    Heart attack Mother    Heart disease Father     Heart attack Father     Review of Systems 10 systems were reviewed and are negative except as noted specifically in the HPI.  Objective   Vital signs in last 24 hours: BP (!) 156/65    Pulse (!) 105    Temp (!) 97.5 F (36.4 C) (Oral)    Resp 20    Ht 5\' 10"  (1.778 m)    Wt 88.5 kg    SpO2 94%    BMI 27.99 kg/m   Physical Exam General: NAD, A&O, resting, appropriate Abdomen: Soft, NTTP, nondistended, obese. GU: Uncircumcised, below average SPL, some dried blood around the groin, attempting to use the urinal with small volume, less than 20 cc of cherry colored urine in the urinal.  After placement of 18 French coud catheter with Urojet, outflow of urine was clear yellow and then turned to cherry red.  Hand irrigation the bedside demonstrated approximately 2 mL of several small clots and after hand irrigation with 1 L normal saline, urine output was palpated. No CVA tenderness   Most Recent Labs: Lab Results  Component Value Date   WBC 9.0 08/16/2019   HGB 13.3 08/16/2019   HCT 39.7 08/16/2019   PLT 126 (L) 08/16/2019    Lab Results  Component Value Date   NA 135 08/16/2019   K 3.8 08/16/2019   CL 102 08/16/2019   CO2 25 08/16/2019   BUN 15 08/16/2019   CREATININE 1.04 08/16/2019   CALCIUM 8.9 08/16/2019   MG 1.3 (L) 08/16/2019    Lab Results  Component Value Date   INR 1.1 08/11/2019   APTT 41 (H) 08/11/2019    IMAGING: No recent urologic imaging. Unable to see films of CT angio of abdomen and pelvis from 07/14/19 although reports pulmonary nobules and 8.3cm left renal mass but no renal tumor thrombus. Enlarged prostate 210cc.   ------  Assessment:  79 y.o. male with multiple medical comorbidities including TIA, stroke, coronary artery disease, aortic stenosis status post TAVR, diabetes, hypertension, hyperlipidemia, lower urinary tract symptoms, BPH with a prostate size of 210 cc, 8 cm left renal mass,  and now with acute urinary retention and gross  hematuria.  Urinary retention: Unclear etiology for urinary retention.  BPH in the setting of recent surgery is most likely.  Urinary retention is likely multifactorial in the setting of decreased ambulation, polypharmacy, anticholinergic medications, and recent catheterization.  83 French coud catheter was able to be successfully placed at the bedside on 08/16/2019 with use of Urojet alone; return of 1100 mL.  Given concern for bladder stretch injury, catheter should remain in place for several days while monitoring gross hematuria.  -Continue Foley catheter to gravity drainage  Gross hematuria: Unclear etiology although differential is wide including benign prostatic hypertrophy, recent anticoagulation use, left renal mass, traumatic catheterization attempts.  Patient is not in clot urinary retention, hand irrigated the bedside with 1 L of normal saline without return of appreciable clot volume.  Hemoglobins have been stable over the course of his admission.  -Continue to monitor -Would need to consider placement of three-way hematuria catheter if develops clot urinary retention -If gross hematuria worsens, will discuss anticoagulation with surgical team.   Large left renal mass: Mass is known, outpatient follow-up is arranged.    Thank you for this consult. Please contact the urology consult pager with any further questions/concerns.

## 2019-08-16 NOTE — Progress Notes (Signed)
  [facility]   MULTIDISCIPLINARY HEART VALVE TEAM   Head MRI reviewed which showed a 4 mm acute infarct left medial frontal cortex. I spoke with Dr. Malen Gauze with neurology who will see the patient.   Angelena Form PA-C  [facility]

## 2019-08-16 NOTE — Progress Notes (Signed)
  Echocardiogram 2D Echocardiogram has been performed.  Marybelle Killings 08/16/2019, 8:17 AM

## 2019-08-16 NOTE — Progress Notes (Signed)
CARDIAC REHAB PHASE I   PRE:  Rate/Rhythm: 90 1st Deg  BP:  Lying: 151/71  Sitting: 144/62  Standing: 99/57   Standing (after 39m): 100/46    SaO2: 95  MODE:  Ambulation: to BR  POST:  Rate/Rhythm: 68 1st Deg  BP:  Sitting: 121/98  Lying: 117/53    SaO2: 94 RA   Went to offer to walk with pt, pt requesting to use restroom, insistent on using bathroom instead of urinal or BSC. Orthostatics taken, pts BP noted to drop upon rising, maintained color and mentation, denied dizziness or foggy feeling. Pt then ambulated Ax2 to BR with front wheel walker. Pt stated needing to stand to use urinal, helped pull gown out of way and pt began, missing the toilet. EP left room to grab a towel so pt wouldn't slip. While standing there, pt began to slump, yelled for help. Pt sat on BSC in room, BP retaken, noted above, HR noted to decrease from 80-90s to 60-70s.. Pt became pale and unresponsive with glazed look. Yelled for help. Upon sternal rub pt able to state his name. Pt then physically placed in bed, color returned and pt more responsive. Rapid response and PA called. Pt states he remembers peeing, but does not know how he got back into the bed. Rapid response and RN at bedside. Pt set on frequent vitals. CN made aware. Will continue to follow as appropriate.  [id number] Rufina Falco, RN BSN 08/16/2019 10:08 AM

## 2019-08-16 NOTE — Progress Notes (Signed)
Wife of patient required assistance from daughter today to come up to visit pt. Reviewed the visitation policy whereby wife and granddaughter were listed as Lawyer. After discussion and the need for wife to have assistance, the two designated visitors are now the wife and the daughter. ---Velora Mediate

## 2019-08-16 NOTE — Progress Notes (Addendum)
[facility] TEAM  Patient Name: Alexis Griffith Date of Encounter: 08/16/2019  Primary Cardiologist: Dr. Acie Fredrickson / Dr. Burt Knack & Dr. Cyndia Bent (TAVR)  Hospital Problem List     Principal Problem:   S/P TAVR (transcatheter aortic valve replacement) Active Problems:   PVC (premature ventricular contraction)   Hypertension   Hyperlipidemia   Carotid artery disease (HCC)   TIA (transient ischemic attack)   Coronary artery disease involving native coronary artery of native heart with angina pectoris ([place])   Acute on chronic diastolic heart failure ([place])   Diabetes mellitus without complication ([place])   Severe aortic stenosis   Sleep apnea   Renal mass     Subjective   Pt did not sleep at all. Feels terrible. Has a terrible headache. Cannot get up to walk- feels too weak.   Inpatient Medications    Scheduled Meds: . aspirin  81 mg Oral Daily  . clonazePAM  0.5 mg Oral QID  . clopidogrel  75 mg Oral Q breakfast  . doxazosin  4 mg Oral Daily  . insulin aspart  0-24 Units Subcutaneous TID AC & HS  . isosorbide mononitrate  30 mg Oral Daily  . losartan-hydrochlorothiazide  1 tablet Oral Daily  . metoprolol tartrate  50 mg Oral TID  . metoprolol tartrate  50 mg Oral TID  . rosuvastatin  5 mg Oral Daily  . sodium chloride flush  3 mL Intravenous Q12H   Continuous Infusions: . sodium chloride    . levofloxacin (LEVAQUIN) IV    . nitroGLYCERIN    . phenylephrine (NEO-SYNEPHRINE) Adult infusion     PRN Meds: sodium chloride, acetaminophen **OR** acetaminophen, bismuth subsalicylate, morphine injection, ondansetron (ZOFRAN) IV, oxyCODONE, sodium chloride flush, traMADol   Vital Signs    Vitals:   08/16/19 0254 08/16/19 0400 08/16/19 0441 08/16/19 0546  BP: (!) 130/50 (!) 146/60 (!) 122/53 (!) 149/60  Pulse: 92 88 82 91  Resp: 20 20 (!) 22 20  Temp:  98 F (36.7 C)    TempSrc:  Oral    SpO2: 94% 93% 92% 91%  Weight:  88.5 kg      Height:        Intake/Output Summary (Last 24 hours) at 08/16/2019 0736 Last data filed at 08/16/2019 0441 Gross per 24 hour  Intake 2755.83 ml  Output 4770 ml  Net -2014.17 ml   Filed Weights   08/15/19 0620 08/16/19 0400  Weight: 90.7 kg 88.5 kg    Physical Exam   GEN: Well nourished, well developed, in no acute distress. Appears lethargic HEENT: Grossly normal.  Neck: Supple, no JVD, carotid bruits, or masses. Cardiac: RRR, no murmurs, rubs, or gallops. No clubbing, cyanosis, edema.   Respiratory:  Respirations regular and unlabored, clear to auscultation bilaterally. GI: Soft, nontender, nondistended, BS + x 4. MS: no deformity or atrophy. Skin: warm and dry, no rash.  Groin sites clear without hematoma or ecchymosis. Right groin has some mild oozing but soft  Neuro:  Strength and sensation are intact. Psych: AAOx3.  Normal affect.  Labs    CBC Recent Labs    08/15/19 0955 08/16/19 0345  WBC  --  9.0  HGB 12.6* 13.3  HCT 37.0* 39.7  MCV  --  82.9  PLT  --  476*   Basic Metabolic Panel Recent Labs    08/15/19 0927 08/15/19 0955  NA 138 138  K 3.9 4.0  CL 100 100  GLUCOSE  138* 128*  BUN 22 23  CREATININE 1.10 1.00   Liver Function Tests No results for input(s): AST, ALT, ALKPHOS, BILITOT, PROT, ALBUMIN in the last 72 hours. No results for input(s): LIPASE, AMYLASE in the last 72 hours. Cardiac Enzymes No results for input(s): CKTOTAL, CKMB, CKMBINDEX, TROPONINI in the last 72 hours. BNP Invalid input(s): POCBNP D-Dimer No results for input(s): DDIMER in the last 72 hours. Hemoglobin A1C No results for input(s): HGBA1C in the last 72 hours. Fasting Lipid Panel No results for input(s): CHOL, HDL, LDLCALC, TRIG, CHOLHDL, LDLDIRECT in the last 72 hours. Thyroid Function Tests No results for input(s): TSH, T4TOTAL, T3FREE, THYROIDAB in the last 72 hours.  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 Exam Location:  Inpatient Procedure: Limited Echo, Cardiac Doppler and Color Doppler Indications:     Aortic stenosis  History:         Patient has prior history of Echocardiogram examinations, most                  recent 07/18/2019. CAD, Carotid Disease and TIA, Aortic Valve                  Disease; Risk Factors:Hypertension, Dyslipidemia and Diabetes.                  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 Procedure Date:                  08/15/2019.  Sonographer:     Dustin Flock Referring Phys:  [id number] Woodfin Ganja THOMPSON Diagnosing Phys: Sanda Klein MD                   PREOPERATIVE FINDINGS:                   Normal left ventricular systolic function, Estimated LV                  ejection fraction 55-60%. Basal inferolateral mild hypokinesis.                  Trileaflet aortic valve with severe calcific stenosis. Mild                  aortic regurgitation.                  Peak aortic  valve gradient 56 mm Hg, mean gradient 33 mm Hg,                  dimensionless obstructive index 0.18, estimated aortic valve                  area 0.6 cm sq.                  No mitral insufficiency.                  No pericardial effusion.                   POST-PROCEDURE FINDINGS                   Normal left ventricular systolic function, Estimated LV                  ejection fraction 55-60%. Basal inferolateral mild hypokinesis.                  Well seated aortic  stent-valve TAVR. No aortic regurgitation.                  No perivalvular leak.                  Peak aortic valve gradient 12 mm Hg, mean gradient 6 mm Hg,                  dimensionless obstructive index 0.67, estimated aortic valve                  area 2.14 cm sq.                  Mild mitral insufficiency.                  No pericardial effusion. IMPRESSIONS  1. Left ventricular ejection fraction, by estimation, is 55 to 60%. The left ventricle has normal function. The left ventricle demonstrates regional wall motion abnormalities (see scoring diagram/findings for description). There is mild concentric left ventricular hypertrophy. Left ventricular diastolic function could not be evaluated.  2. Right ventricular systolic function is normal. Tricuspid regurgitation signal is inadequate for assessing PA pressure.  3. Left atrial size was mildly dilated.  4. The mitral valve is normal in structure. Mild mitral valve regurgitation.  5. The aortic valve has been repaired/replaced. Aortic valve regurgitation is not visualized. Severe aortic valve stenosis. 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 Procedure Date: 08/15/2019. Echo findings  are consistent with normal structure and function of the aortic valve prosthesis. FINDINGS  Left Ventricle: Left ventricular ejection fraction, by estimation, is 55 to 60%. The left ventricle has normal function. The left ventricle demonstrates regional wall motion abnormalities. The left ventricular internal cavity size was normal in size. There is mild concentric left ventricular hypertrophy.  LV Wall Scoring: The basal inferolateral segment and basal inferior segment are hypokinetic. Right Ventricle: No increase in right ventricular wall thickness. Right ventricular systolic function is normal. Tricuspid regurgitation signal is inadequate for assessing PA pressure. Left Atrium: Left atrial size was mildly dilated. Right Atrium:  Right atrial size was normal in size. Pericardium: There is no evidence of pericardial effusion. Mitral Valve: The mitral valve is normal in structure. There is mild thickening of the mitral valve leaflet(s). Mild mitral valve regurgitation, with posteriorly-directed jet. Tricuspid Valve: The tricuspid valve is  normal in structure. Aortic Valve: The aortic valve has been repaired/replaced.. There is severe thickening and severe calcifcation of the aortic valve. Aortic valve regurgitation is not visualized. Severe aortic stenosis is present. There is severe thickening of the aortic valve. There is severe calcifcation of the aortic valve. Aortic valve mean gradient measures 6.0 mmHg. Aortic valve peak gradient measures 12.4 mmHg. Aortic valve area, by VTI measures 2.23 cm. 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 Procedure Date: 08/15/2019. Echo findings are consistent with normal structure and function of the aortic valve prosthesis. Pulmonic Valve: The pulmonic valve was not assessed. Aorta: The aortic root and ascending aorta are structurally normal, with no evidence of dilitation. IAS/Shunts: The interatrial septum was not assessed.  LEFT VENTRICLE PLAX 2D LVOT diam:     2.07 cm LV SV:         90 LV SV Index:   43 LVOT Area:     3.36 cm  AORTIC VALVE AV Area (Vmax):    2.08 cm AV Area (Vmean):   2.08 cm AV Area (VTI):     2.23 cm AV Vmax:           176.00 cm/s AV Vmean:          116.000 cm/s AV VTI:            0.404 m AV Peak Grad:      12.4 mmHg AV Mean Grad:      6.0 mmHg LVOT Vmax:         109.00 cm/s LVOT Vmean:        71.800 cm/s LVOT VTI:          0.269 m LVOT/AV VTI ratio: 0.67  SHUNTS Systemic VTI:  0.27 m Systemic Diam: 2.07 cm Sanda Klein MD Electronically signed by Sanda Klein MD Signature Date/Time: 08/15/2019/10:06:02 AM    Final    Structural Heart Procedure  Result Date: 08/15/2019 See surgical note for result.   Cardiac  Studies    TAVR OPERATIVE NOTE   Date of Procedure:                08/15/2019  Preoperative Diagnosis:      Severe Aortic Stenosis   Postoperative Diagnosis:    Same   Procedure:        Transcatheter Aortic Valve Replacement - Percutaneous Right Transfemoral Approach             Edwards Sapien 3 Ultra THV (size 26 mm, model # 9750TFX, serial # [id number])              Co-Surgeons:                        Gaye Pollack, MD and Sherren Mocha, MD    Anesthesiologist:                  Annye Asa, MD  Echocardiographer:              Bertrum Sol, MD  Pre-operative Echo Findings: ? Severe aortic stenosis ? Normal left ventricular systolic function  Post-operative Echo Findings: ? NO paravalvular leak ? Normal left ventricular systolic function  _________________  Echo 08/16/19: pending    Patient Profile     NICHOLAD KAUTZMAN is a 79 y.o. male with a history of HTN, HLD, DMT2, OSA, history of CVA, multivessel CAD, recently diagnosed renal cell carcinoma and severe AS who  presented to [facility] 08/15/19 for planned TAVR.  Assessment & Plan    Severe AS: s/p successful TAVR with a 26 mm Edwards Sapien 3 THV via the TF approach on 08/15/19. Post operative echo pending. Groin sites are stable, mild oozing on right. ECG with sinus and 1st deg AV block (old) and no high grade heart block. Started on Asprin and Plavix (home Aggrenox discontinued).   Acute on chronic diastolic CHF: as evidenced by an elevated BNP on pre admission lab work. This has been treated with TAVR. I have resumed his home Hyzaar  New gait instability and weakness: pt had difficulty getting up this morning to use the bathroom. His HR acutely dropped from 80 to 60 bpm. He became disoriented and weak. Patient said he cannot get up to walk at all at this time, which is not typical after TAVR. I did a neuro exam with no focal neurologic deficits. If he continues to have issues mobilizing, we will need to  consider scanning his head.   Headache: pt has had a headache all night with no relief despite tylenol, oxycodone and morphine. He also did not sleep at all last night.   HTN: BP has been elevated. I have resumed his home medications.   DMT2: continue SSI  RCC: pre TAVR CT showed a large RCC and pulmonary nodules. Follow up PET scan did not show evidence of metastatic disease. He has an apt with Dr. Tresa Moore with urology at the end of this month.  Pulmonary nodules: PET scan showed a dominant right lower lobe 1.2 cm solid pulmonary nodule is non-hypermetabolic. Additional small solid subcentimeter pulmonary nodules in both lungs are below PET resolution and warrant attention on follow-up chest CT in 3 months. This will be followed over time.   Signed, Angelena Form, PA-C  08/16/2019, 7:36 AM  Pager [phone]  Patient seen, examined. Available data reviewed. Agree with findings, assessment, and plan as outlined by Nell Range, PA-C.  On exam the patient is awake and alert, well-appearing, no acute distress.  Lungs are clear, heart is regular rate and rhythm with 2/6 ejection murmur at the right upper sternal border, no diastolic murmur, abdomen soft and nontender, bilateral groin sites are clear.  There is mild ecchymoses in the right groin without hematoma.  Lower extremities have no edema.  Skin is warm and dry with no rash.  Telemetry shows normal sinus rhythm with no arrhythmia.  Severe headache overnight and gait instability this morning noted.  Agree with MRI brain.  Discussed earlier in the day with Nell Range.  Patient symptoms also appear to be related to a neuro depressor event this morning.  This was the first time he was out of bed since TAVR.  He became hypotensive and briefly unresponsive.  He seems to be back to baseline now.  Both groin sites are stable.  There is no evidence of bleeding with a stable hemoglobin on lab work.  The patient's echocardiogram is reviewed and shows no  pericardial effusion and normal function of his transcatheter heart valve.  We will keep him overnight, follow-up on MRI of the brain, and hopefully will be able to discharge him home tomorrow if he is ambulatory without symptoms at that time.  Sherren Mocha, M.D. 08/16/2019 1:26 PM

## 2019-08-16 NOTE — Progress Notes (Signed)
When pt got up to use the urinal and stand on the scale, he became very weak and disoriented after about two minutes of standing up.  Pt had trouble focusing and he was slow to respond.  Noticed that his heart rate dropped from the high 80s to the low 60s very quickly when he was standing.  Nurse gently coaxed the pt to sit on the bed.  After a minute of sitting, the pt was responding normally.  Resting in bed allowed his heart rate to go back up to the 80s.  Current BP is 122/53.  Also noticed a minimal amount of drainage from the incision site on the right groin.  Pt said his right groin felt sore but it was still soft to touch.  Encourage pt to not move his right leg too often and to keep it straight.  Pt currently resting in bed.  Will continue to monitor.  Lupita Dawn, RN

## 2019-08-16 NOTE — Progress Notes (Signed)
Patient stated he has increasing headache pain now a level 8. BP increased again 173/73. Physician notified of patient status. Physician added a 1 time addition of 25 mg Metoprolol for a total of 50 mg.  Also modified the current order for Metoprolol to 50 mg TID. The additional Metoprolol was given along with 2 mg of Morphine. After 30 minutes BP 130/50. Headache was still a level 7. Patient currently resting in bed.   Lonia Blood, RN

## 2019-08-16 NOTE — Consult Note (Addendum)
Neurology Consultation  Reason for Consult: Stroke found on MRI Referring Physician: Dr. Sherren Mocha  CC: Generalized weakness with MRI confirming punctate stroke  History is obtained from: Chart and patient  HPI: Alexis Griffith is a 79 y.o. 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  Reason for patient's admission to hospital was due to obtaining TAVR.  Today, at approximately 449 this morning patient already used the bathroom thus he had gotten up with two-person assistance attempting to use the urinal.  At that time was noted that he is having trouble focusing slow to respond.  His heart rate dropped from 80s to the 60s but then improved when he sat down.  At 9:46 AM rapid response was called to his room.  At that time he had stood up and went to the actual bathroom.  With initially standing up his blood pressure dropped to 99/57 heart rates in the 90s.  A few minutes after standing he became disoriented and very weak.  And patient was assisted to the bed.  In talking to the patient he does recall getting up, but does not recall the event between standing at the urinal and then putting him to his bed.  Currently he feels very weak all over.  He denies any headache, decreased sensation, decreased vision, difficulty with speaking.  Neurology was consulted due to MRI showing punctate stroke.  LKW: 10 AM tpa given?: no, recent surgery and out of the window Premorbid modified Rankin scale (mRS): 0 NIH stroke score: 1   Past Medical History:  Diagnosis Date  . Anxiety   . CAD (coronary artery disease)   . Carotid artery disease ([place]) 08/24/2011  . Coronary artery disease involving native coronary artery of native heart with angina pectoris ([place]) 07/04/2019  . Diabetes mellitus without complication ([place])    . Excessive urination at night 06/05/2013  . Giant cell arteritis ([place]) 05/17/2012  . Hyperlipidemia   . Hypertension   . PVC (premature ventricular contraction)   . Renal mass   . 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 Burt Knack & Dr. Cyndia Bent  . Severe aortic stenosis   . Sinus bradycardia on ECG   . Sleep apnea   . 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 Wilburn Cornelia.  . Stroke ([place])   . TIA (transient ischemic attack)      Family History  Problem Relation Age of Onset  . Heart disease Mother   . Heart attack Mother   . Heart disease Father   . Heart attack Father    Social History:   reports that he quit smoking about 44 years ago. He has never used smokeless tobacco. He reports that he does not drink alcohol and does not use drugs.  Medications  Current Facility-Administered Medications:  .  0.9 %  sodium chloride infusion, 250 mL, Intravenous, PRN, Eileen Stanford, PA-C .  acetaminophen (TYLENOL) tablet 650 mg, 650 mg, Oral, Q6H PRN, 650 mg at 08/15/19 2153 **OR** acetaminophen (TYLENOL) suppository 650 mg, 650 mg, Rectal, Q6H PRN, Eileen Stanford, PA-C .  aspirin chewable tablet 81 mg, 81 mg, Oral, Daily, Eileen Stanford, PA-C, 81 mg at 08/16/19 0831 .  bismuth subsalicylate (PEPTO BISMOL) 262 MG/15ML suspension 30  mL, 30 mL, Oral, Q4H PRN, Sueanne Margarita, MD, 30 mL at 08/15/19 2338 .  clonazePAM (KLONOPIN) tablet 0.5 mg, 0.5 mg, Oral, QID, Eileen Stanford, PA-C, 0.5 mg at 08/16/19 1302 .  clopidogrel (PLAVIX) tablet 75 mg, 75 mg, Oral, Q breakfast, Eileen Stanford, PA-C, 75 mg at 08/16/19 0831 .  doxazosin (CARDURA) tablet 4 mg, 4 mg, Oral, Daily, Eileen Stanford, PA-C, 4 mg at 08/16/19 0830 .  losartan (COZAAR) tablet 100 mg, 100 mg, Oral, Daily, 100 mg at 08/16/19 0830 **AND** hydrochlorothiazide (HYDRODIURIL) tablet 25 mg, 25 mg, Oral, Daily,  Sherren Mocha, MD, 25 mg at 08/16/19 0831 .  CBG monitoring, , , 4x Daily, AC & HS **AND** insulin aspart (novoLOG) injection 0-24 Units, 0-24 Units, Subcutaneous, TID AC & HS, Eileen Stanford, PA-C, 4 Units at 08/16/19 1158 .  isosorbide mononitrate (IMDUR) 24 hr tablet 30 mg, 30 mg, Oral, Daily, Eileen Stanford, PA-C, 30 mg at 08/16/19 0830 .  metoprolol tartrate (LOPRESSOR) tablet 50 mg, 50 mg, Oral, TID, Turner, Traci R, MD, 50 mg at 08/16/19 1520 .  morphine 2 MG/ML injection 1-4 mg, 1-4 mg, Intravenous, Q1H PRN, Eileen Stanford, PA-C, 2 mg at 08/16/19 0221 .  nitroGLYCERIN 50 mg in dextrose 5 % 250 mL (0.2 mg/mL) infusion, 0-100 mcg/min, Intravenous, Titrated, Eileen Stanford, PA-C .  ondansetron [facility]) injection 4 mg, 4 mg, Intravenous, Q6H PRN, Eileen Stanford, PA-C .  oxyCODONE (Oxy IR/ROXICODONE) immediate release tablet 5-10 mg, 5-10 mg, Oral, Q3H PRN, Eileen Stanford, PA-C, 5 mg at 08/16/19 0557 .  phenylephrine (NEOSYNEPHRINE) 20-0.9 MG/250ML-% infusion, 0-100 mcg/min, Intravenous, Titrated, Eileen Stanford, PA-C .  rosuvastatin (CRESTOR) tablet 5 mg, 5 mg, Oral, Daily, Eileen Stanford, PA-C, 5 mg at 08/16/19 0830 .  sodium chloride flush (NS) 0.9 % injection 3 mL, 3 mL, Intravenous, Q12H, Eileen Stanford, PA-C, 3 mL at 08/16/19 0935 .  sodium chloride flush (NS) 0.9 % injection 3 mL, 3 mL, Intravenous, PRN, Eileen Stanford, PA-C .  traMADol Veatrice Bourbon) tablet 50-100 mg, 50-100 mg, Oral, Q4H PRN, Eileen Stanford, PA-C .  zolpidem (AMBIEN) tablet 5 mg, 5 mg, Oral, QHS PRN, Angelena Form R, PA-C  ROS:  General ROS: negative for - chills, fatigue, fever, night sweats, weight gain or weight loss Psychological ROS: negative for - behavioral disorder, hallucinations, memory difficulties, mood swings or suicidal ideation Ophthalmic ROS: negative for - blurry vision, double vision, eye pain or loss of vision ENT ROS: negative for - epistaxis,  nasal discharge, oral lesions, sore throat, tinnitus or vertigo Allergy and Immunology ROS: negative for - hives or itchy/watery eyes Hematological and Lymphatic ROS: negative for - bleeding problems, bruising or swollen lymph nodes Endocrine ROS: negative for - galactorrhea, hair pattern changes, polydipsia/polyuria or temperature intolerance Respiratory ROS: negative for - cough, hemoptysis, shortness of breath or wheezing Cardiovascular ROS: negative for - chest pain, dyspnea on exertion, edema or irregular heartbeat Gastrointestinal ROS: negative for - abdominal pain, diarrhea, hematemesis, nausea/vomiting or stool incontinence Genito-Urinary ROS: negative for - dysuria, hematuria, incontinence or urinary frequency/urgency Musculoskeletal ROS: negative for - joint swelling or muscular weakness Neurological ROS: as noted in HPI Dermatological ROS: negative for rash and skin lesion changes  Exam: Current vital signs: BP (!) 142/47 (BP Location: Left Arm)   Pulse 70   Temp 98 F (36.7 C) (Oral)   Resp 17   Ht 5\' 10"  (1.778 m)   Wt 88.5 kg  SpO2 96%   BMI 27.99 kg/m  Vital signs in last 24 hours: Temp:  [97.6 F (36.4 C)-99.3 F (37.4 C)] 98 F (36.7 C) (06/16 1600) Pulse Rate:  [67-99] 70 (06/16 1600) Resp:  [14-23] 17 (06/16 1600) BP: (99-181)/(46-79) 142/47 (06/16 1600) SpO2:  [91 %-97 %] 96 % (06/16 1600) Weight:  [88.5 kg] 88.5 kg (06/16 0400)   Constitutional: Appears well-developed and well-nourished.  Psych: Affect appropriate to situation Eyes: No scleral injection HENT: No OP obstrucion Head: Normocephalic.  Cardiovascular: Normal rate and regular rhythm.  Respiratory: Effort normal, non-labored breathing GI: Soft.  No distension. There is no tenderness.  Skin: WDI  Neuro: Mental Status: Patient is awake, alert, oriented to person, place, month, year, and situation.  Speech shows no dysarthria or aphasia.  Patient was able to follow commands without  difficulty.  Patient is able to give a good history. Cranial Nerves: II: Visual Fields are full.  III,IV, VI: EOMI without ptosis or diploplia. Pupils equal, round and reactive to light V: Facial sensation is symmetric to temperature VII: Facial movement is symmetric.  VIII: hearing is intact to voice X: Palat elevates symmetrically XI: Shoulder shrug is symmetric. XII: tongue is midline without atrophy or fasciculations.  Motor: Tone is normal. Bulk is normal. 5/5 strength in bilateral upper extremities and left lower extremity.  Patient does have weakness in his right lower extremity secondary to recent catheter for surgery.  Drift-positive on the right leg however this is secondary to pain from catheter placed yesterday from for surgery Sensory: Sensation is symmetric to light touch and temperature in the arms and legs. Deep Tendon Reflexes: 2+ and symmetric in the biceps and patellae.  Plantars: Toes are downgoing bilaterally.  Cerebellar: FNF and HKS are intact bilaterally  Labs I have reviewed labs in epic and the results pertinent to this consultation are:  CBC    Component Value Date/Time   WBC 9.0 08/16/2019 0345   RBC 4.79 08/16/2019 0345   HGB 13.3 08/16/2019 0345   HGB 14.5 07/04/2019 1216   HCT 39.7 08/16/2019 0345   HCT 44.4 07/04/2019 1216   PLT 126 (L) 08/16/2019 0345   PLT 176 07/04/2019 1216   MCV 82.9 08/16/2019 0345   MCV 84 07/04/2019 1216   MCH 27.8 08/16/2019 0345   MCHC 33.5 08/16/2019 0345   RDW 13.1 08/16/2019 0345   RDW 13.4 07/04/2019 1216   LYMPHSABS 2.2 12/20/2013 1442   MONOABS 0.6 12/30/2011 1416   EOSABS 0.1 12/20/2013 1442   BASOSABS 0.0 12/20/2013 1442    CMP     Component Value Date/Time   NA 135 08/16/2019 0345   NA 137 07/04/2019 1216   K 3.8 08/16/2019 0345   CL 102 08/16/2019 0345   CO2 25 08/16/2019 0345   GLUCOSE 144 (H) 08/16/2019 0345   BUN 15 08/16/2019 0345   BUN 23 07/04/2019 1216   CREATININE 1.04 08/16/2019  0345   CREATININE 1.14 03/25/2015 0920   CALCIUM 8.9 08/16/2019 0345   PROT 6.8 08/11/2019 0843   PROT 6.9 06/14/2019 0959   ALBUMIN 3.8 08/11/2019 0843   ALBUMIN 4.6 06/14/2019 0959   AST 21 08/11/2019 0843   ALT 20 08/11/2019 0843   ALKPHOS 36 (L) 08/11/2019 0843   BILITOT 0.7 08/11/2019 0843   BILITOT 0.3 06/14/2019 0959   GFRNONAA >60 08/16/2019 0345   GFRAA >60 08/16/2019 0345    Lipid Panel     Component Value Date/Time   CHOL 139 06/14/2019  0959   TRIG 135 06/14/2019 0959   HDL 35 (L) 06/14/2019 0959   CHOLHDL 4.0 06/14/2019 0959   CHOLHDL 5.1 (H) 03/25/2015 0920   VLDL 30 03/25/2015 0920   LDLCALC 80 06/14/2019 0959   LDL on 06/14/2019 80 Hemoglobin A1c 7.0  Imaging I have reviewed the images obtained:  MRI examination of the brain-4 mm acute infarct left medial frontal cortex, generalized atrophy, no significant chronic ischemic changes  Limited echocardiogram-left ventricular ejection fraction estimated be 55 to 60%.  Left ventricle demonstrates regional wall motion abnormalities.  Etta Quill PA-C [facility] [phone]  M-F  (9:00 am- 5:00 PM)  08/16/2019, 5:04 PM   Assessment:  This 79 year old male initially presented to the hospital for TAVR.  While in the hospital today he was noted to have 1 event where he became bradycardic.  He also had a second event at approximately 10 AM where he became orthostatic hypotensive with some mild retrograde amnesia.  Patient was sent for MRI which did show a small 4 mm acute infarct in the left medial frontal cortex.  The stroke is likely incidental and periprocedural-on exam, other than weakness and slight drift on the right leg where the access was obtained for TAVR-other than that no abnormality on exam.  Impression: -4 mm acute infarct left medial frontal cortex most likely periprocedural in the setting of recent TAVR  -Possible orthostasis-likely due to dehydration and being  n.p.o.  Recommendations: -CTA head and neck -PT/OT -Antiplatelets per cardiology-aspirin and Plavix-okay from a stroke prevention standpoint. -Cardiac monitoring to ensure no evidence of paroxysmal atrial fibrillation. -Statin for goal LDL less than 70 -Check orthostatic vitals and management of orthostasis and fluids per primary team as you are.  Stroke team will follow imaging with you   Attending Neurohospitalist Addendum Patient seen and examined with APP/Resident. Agree with the history and physical as documented above. Agree with the plan as documented, which I helped formulate. I have independently reviewed the chart, obtained history, review of systems and examined the patient.I have personally reviewed pertinent head/neck/spine imaging (CT/MRI). Answered all the questions that the patient, and the family at bedside had. Discussed with Dr. Burt Knack at bedside. Please feel free to call with any questions. --- Amie Portland, MD [facility] Pager: [phone]  If 7pm to 7am, please call on call as listed on AMION.

## 2019-08-17 ENCOUNTER — Inpatient Hospital Stay (HOSPITAL_COMMUNITY): Payer: Medicare Other

## 2019-08-17 DIAGNOSIS — R55 Syncope and collapse: Secondary | ICD-10-CM

## 2019-08-17 DIAGNOSIS — I639 Cerebral infarction, unspecified: Secondary | ICD-10-CM

## 2019-08-17 DIAGNOSIS — E782 Mixed hyperlipidemia: Secondary | ICD-10-CM

## 2019-08-17 LAB — CBC
HCT: 37.6 % — ABNORMAL LOW (ref 39.0–52.0)
HCT: 37.2 % — ABNORMAL LOW (ref 39.0–52.0)
Hemoglobin: 12.6 g/dL — ABNORMAL LOW (ref 13.0–17.0)
Hemoglobin: 12.3 g/dL — ABNORMAL LOW (ref 13.0–17.0)
MCH: 27.8 pg (ref 26.0–34.0)
MCH: 27.9 pg (ref 26.0–34.0)
MCHC: 33.5 g/dL (ref 30.0–36.0)
MCHC: 33.1 g/dL (ref 30.0–36.0)
MCV: 83 fL (ref 80.0–100.0)
MCV: 84.4 fL (ref 80.0–100.0)
Platelets: 120 10*3/uL — ABNORMAL LOW (ref 150–400)
Platelets: 121 10*3/uL — ABNORMAL LOW (ref 150–400)
RBC: 4.53 MIL/uL (ref 4.22–5.81)
RBC: 4.41 MIL/uL (ref 4.22–5.81)
RDW: 13.2 % (ref 11.5–15.5)
RDW: 13.1 % (ref 11.5–15.5)
WBC: 8.5 10*3/uL (ref 4.0–10.5)
WBC: 9.3 10*3/uL (ref 4.0–10.5)
nRBC: 0 % (ref 0.0–0.2)
nRBC: 0 % (ref 0.0–0.2)

## 2019-08-17 LAB — BASIC METABOLIC PANEL
Anion gap: 11 (ref 5–15)
BUN: 20 mg/dL (ref 8–23)
CO2: 24 mmol/L (ref 22–32)
Calcium: 8.9 mg/dL (ref 8.9–10.3)
Chloride: 99 mmol/L (ref 98–111)
Creatinine, Ser: 1.35 mg/dL — ABNORMAL HIGH (ref 0.61–1.24)
GFR calc Af Amer: 58 mL/min — ABNORMAL LOW (ref >60)
GFR calc non Af Amer: 50 mL/min — ABNORMAL LOW (ref >60)
Glucose, Bld: 153 mg/dL — ABNORMAL HIGH (ref 70–99)
Potassium: 3.7 mmol/L (ref 3.5–5.1)
Sodium: 134 mmol/L — ABNORMAL LOW (ref 135–145)

## 2019-08-17 LAB — GLUCOSE, CAPILLARY
Glucose-Capillary: 150 mg/dL — ABNORMAL HIGH (ref 70–99)
Glucose-Capillary: 133 mg/dL — ABNORMAL HIGH (ref 70–99)
Glucose-Capillary: 241 mg/dL — ABNORMAL HIGH (ref 70–99)
Glucose-Capillary: 176 mg/dL — ABNORMAL HIGH (ref 70–99)

## 2019-08-17 LAB — LIPID PANEL
Cholesterol: 99 mg/dL (ref 0–200)
HDL: 35 mg/dL — ABNORMAL LOW (ref >40)
LDL Cholesterol: 45 mg/dL (ref 0–99)
Total CHOL/HDL Ratio: 2.8 RATIO
Triglycerides: 96 mg/dL (ref <150)
VLDL: 19 mg/dL (ref 0–40)

## 2019-08-17 MED ORDER — MAGNESIUM SULFATE 2 GM/50ML IV SOLN
2.0000 g | Freq: Once | INTRAVENOUS | Status: AC
  Administered 2019-08-17: 2 g via INTRAVENOUS
  Filled 2019-08-17: qty 50

## 2019-08-17 NOTE — Progress Notes (Signed)
Mobility Specialist: Progress Note    08/17/19 1401  Mobility  Activity Transferred:  Bed to chair  Level of Assistance Moderate assist, patient does 50-74%  Assistive Device Front wheel walker  Mobility Response Tolerated well  Mobility performed by Mobility specialist  Bed Position Chair  $Mobility charge 1 Mobility   Pre-Mobility: 80 HR, 144/55 BP, 95% SpO2 Post-Mobility: 85 HR, 116/52 BP, 93% SpO2  Pt briefly desat to 80% after transfer, resolved quickly. Nurse was notified.   [facility] Kacelyn Rowzee Mobility Specialist

## 2019-08-17 NOTE — Evaluation (Signed)
Physical Therapy Evaluation Patient Details Name: Alexis Griffith MRN: [id number] DOB: 27-Jan-1941 Today's Date: 08/17/2019   History of Present Illness  79 y.o. male with a history of HTN, HLD, DMT2, OSA, history of CVA, multivessel CAD, recently diagnosed renal cell carcinoma and severe AS who presented to [facility] on 08/15/19 for TAVR. Course complicated by AMS, BP and HR drop, imaging reveals 4 mm acute infarct L medial frontal cortex.  Clinical Impression   Pt presents with generalized weakness, difficulty mobilizing post-operatively, + orthostatic hypotension from sit to stand but recovered with mobility (see OT note for vitals), and decreased activity tolerance. Pt to benefit from acute PT to address deficits. Pt ambulated short hallway distance with use of IV pole to steady, pt otherwise unsteady requiring PT HHA. PT recommending HHPT to address mobility deficits post-acutely, pt may progress beyond needing [facility] services. PT to progress mobility as tolerated, and will continue to follow acutely.   PT and OT saw pt as co-evaluation given complexity of pt case (new CVA, post-TAVR, syncopal episode), pt should be a +1 from now on given mobility status.      Follow Up Recommendations Home health PT;Supervision/Assistance - 24 hour    Equipment Recommendations  None recommended by PT    Recommendations for Other Services       Precautions / Restrictions Precautions Precautions: Fall      Mobility  Bed Mobility Overal bed mobility: Needs Assistance             General bed mobility comments: up in chair upon PT/OT arrival.  Transfers Overall transfer level: Needs assistance Equipment used: 1 person hand held assist Transfers: Sit to/from Stand Sit to Stand: Min assist;+2 safety/equipment;From elevated surface         General transfer comment: min assist +2 for power up, steadying, lines/leads management. Pt standing in high guard position for  balance.  Ambulation/Gait Ambulation/Gait assistance: Min assist;+2 safety/equipment Gait Distance (Feet): 80 Feet Assistive device: IV Pole;None Gait Pattern/deviations: Step-through pattern;Decreased stride length;Wide base of support;Trunk flexed Gait velocity: decr   General Gait Details: min assist to steady, navigate room and hallway, manage IV pole/lines. Pt with more high-guard position UEs during gait without IV pole support, benefits from external support at this time. VSS.  Stairs            Wheelchair Mobility    Modified Rankin (Stroke Patients Only) Modified Rankin (Stroke Patients Only) Pre-Morbid Rankin Score: No significant disability Modified Rankin: Moderately severe disability     Balance Overall balance assessment: Needs assistance Sitting-balance support: Feet supported Sitting balance-Leahy Scale: Fair     Standing balance support: No upper extremity supported;During functional activity Standing balance-Leahy Scale: Fair                               Pertinent Vitals/Pain Pain Assessment: No/denies pain    Home Living Family/patient expects to be discharged to:: Private residence Living Arrangements: Spouse/significant other Available Help at Discharge: Family;Available 24 hours/day Type of Home: House Home Access: Stairs to enter   [employer] of Steps: 1 step (in the house) Home Layout: One level Home Equipment: Walker - 2 wheels;Wheelchair - manual      Prior Function Level of Independence: Independent               Hand Dominance   Dominant Hand: Right    Extremity/Trunk Assessment   Upper Extremity Assessment Upper Extremity Assessment:  Defer to OT evaluation    Lower Extremity Assessment Lower Extremity Assessment: Generalized weakness    Cervical / Trunk Assessment Cervical / Trunk Assessment: Normal  Communication   Communication: No difficulties  Cognition Arousal/Alertness:  Awake/alert Behavior During Therapy: WFL for tasks assessed/performed Overall Cognitive Status: Impaired/Different from baseline Area of Impairment: Problem solving;Following commands;Safety/judgement                       Following Commands: Follows one step commands consistently;Follows multi-step commands with increased time Safety/Judgement: Decreased awareness of safety   Problem Solving: Slow processing;Requires verbal cues;Requires tactile cues;Difficulty sequencing General Comments: A&Ox4, increased processing time to respond to PT questions/mobility commands. Sequencing difficulties noted during sink activities, i.e. task of brushing teeth - pt emptied water pitcher first, did not lean far enough over when spitting into sink, not close enough to sink, could not find wash cloth right beside him. Periods of dazed expression during session, typically recovered quickly.      General Comments General comments (skin integrity, edema, etc.): Orthostatics + when moving from sit to stand (see OT note and flowsheets).    Exercises     Assessment/Plan    PT Assessment Patient needs continued PT services  PT Problem List Decreased strength;Decreased mobility;Decreased safety awareness;Decreased activity tolerance;Decreased balance;Decreased knowledge of use of DME;Cardiopulmonary status limiting activity       PT Treatment Interventions DME instruction;Therapeutic activities;Gait training;Therapeutic exercise;Patient/family education;Balance training;Stair training;Functional mobility training;Neuromuscular re-education    PT Goals (Current goals can be found in the Care Plan section)  Acute Rehab PT Goals Patient Stated Goal: go home PT Goal Formulation: With patient Time For Goal Achievement: 08/31/19 Potential to Achieve Goals: Good    Frequency Min 3X/week   Barriers to discharge        Co-evaluation PT/OT/SLP Co-Evaluation/Treatment: Yes Reason for Co-Treatment:  Complexity of the patient's impairments (multi-system involvement);For patient/therapist safety;To address functional/ADL transfers (syncopal episodes) PT goals addressed during session: Mobility/safety with mobility;Balance         AM-PAC PT "6 Clicks" Mobility  Outcome Measure Help needed turning from your back to your side while in a flat bed without using bedrails?: A Little Help needed moving from lying on your back to sitting on the side of a flat bed without using bedrails?: A Little Help needed moving to and from a bed to a chair (including a wheelchair)?: A Little Help needed standing up from a chair using your arms (e.g., wheelchair or bedside chair)?: A Little Help needed to walk in hospital room?: A Little Help needed climbing 3-5 steps with a railing? : A Lot 6 Click Score: 17    End of Session Equipment Utilized During Treatment: Gait belt Activity Tolerance: Patient tolerated treatment well;Patient limited by fatigue Patient left: in chair;with chair alarm set;with call bell/phone within reach;with nursing/sitter in room Nurse Communication: Mobility status;Other (comment) (IV leaking) PT Visit Diagnosis: Other abnormalities of gait and mobility (R26.89);Muscle weakness (generalized) (M62.81);Other symptoms and signs involving the nervous system (R29.898)    Time: 9379-0240 PT Time Calculation (min) (ACUTE ONLY): 29 min   Charges:   PT Evaluation $PT Eval Low Complexity: 1 Low         Jahshua Bonito E, PT Acute Rehabilitation Services Pager [phone]  Office [phone]   Keiley Levey D Elonda Husky 08/17/2019, 4:39 PM

## 2019-08-17 NOTE — Progress Notes (Signed)
Carotid duplex has been completed.   Preliminary results in CV Proc.   Abram Sander 08/17/2019 10:43 AM

## 2019-08-17 NOTE — Progress Notes (Signed)
STROKE TEAM PROGRESS NOTE   INTERVAL HISTORY No family at bedside. Pt recounted HPI with me. He stated that he stood up for urination and his BP dropped, when he tried to urinate in the bathroom, he passed out. No seizure like activity. I did relay the MRI findings with him and he understood that the punctate stroke likely incidental. His carotid stenosis looks worse on CTA but CUS no significant change from 06/2019 with Dr. Acie Fredrickson. He still has gross hematuria, on foley catheter, CT abd found renal mass, pending follow up with urology.   Vitals:   08/17/19 0300 08/17/19 0314 08/17/19 0500 08/17/19 0720  BP: (!) 107/49 (!) 146/58 (!) 118/47 (!) 171/61  Pulse: 74 84 77 94  Resp: 17 18 17 15   Temp:  98 F (36.7 C)  98 F (36.7 C)  TempSrc:  Oral  Oral  SpO2: 94% 95% 93% 94%  Weight:   89.6 kg   Height:        CBC:  Recent Labs  Lab 08/16/19 0345 08/17/19 0302  WBC 9.0 8.5  HGB 13.3 12.6*  HCT 39.7 37.6*  MCV 82.9 83.0  PLT 126* 120*    Basic Metabolic Panel:  Recent Labs  Lab 08/16/19 0345 08/17/19 0302  NA 135 134*  K 3.8 3.7  CL 102 99  CO2 25 24  GLUCOSE 144* 153*  BUN 15 20  CREATININE 1.04 1.35*  CALCIUM 8.9 8.9  MG 1.3*  --    Lipid Panel:     Component Value Date/Time   CHOL 99 08/17/2019 0302   CHOL 139 06/14/2019 0959   TRIG 96 08/17/2019 0302   HDL 35 (L) 08/17/2019 0302   HDL 35 (L) 06/14/2019 0959   CHOLHDL 2.8 08/17/2019 0302   VLDL 19 08/17/2019 0302   LDLCALC 45 08/17/2019 0302   LDLCALC 80 06/14/2019 0959   HgbA1c:  Lab Results  Component Value Date   HGBA1C 7.0 (H) 08/11/2019   Urine Drug Screen: No results found for: LABOPIA, COCAINSCRNUR, LABBENZ, AMPHETMU, THCU, LABBARB  Alcohol Level No results found for: ETH  IMAGING past 24 hours CT ANGIO HEAD W OR WO CONTRAST  Result Date: 08/16/2019 CLINICAL DATA:  Stroke follow-up. Subcentimeter left frontal infarct on MRI. EXAM: CT ANGIOGRAPHY HEAD AND NECK TECHNIQUE: Multidetector CT  imaging of the head and neck was performed using the standard protocol during bolus administration of intravenous contrast. Multiplanar CT image reconstructions and MIPs were obtained to evaluate the vascular anatomy. Carotid stenosis measurements (when applicable) are obtained utilizing NASCET criteria, using the distal internal carotid diameter as the denominator. CONTRAST:  74mL OMNIPAQUE IOHEXOL 350 MG/ML SOLN COMPARISON:  Head MRI 08/16/2019.  Head and neck CTA 12/31/2011. FINDINGS: CT HEAD FINDINGS Brain: The punctate medial left frontal cortical infarct on MRI is not well demonstrated by CT. No acute large territory infarct, intracranial hemorrhage, mass, midline shift, or extra-axial fluid collection is identified. The ventricles and sulci are within normal limits for age. Vascular: Calcified atherosclerosis at the skull base. Skull: No fracture or suspicious osseous lesion. Sinuses: Minimal mucosal thickening in the paranasal sinuses. Clear mastoid air cells. Orbits: Bilateral cataract extraction. Review of the MIP images confirms the above findings CTA NECK FINDINGS Aortic arch: Standard 3 vessel aortic arch with mild atherosclerotic plaque. No significant arch vessel origin stenosis. Right carotid system: Patent with prominent, largely calcified plaque at the carotid bifurcation resulting in progressive high-grade stenosis of the distal common and proximal internal and external carotid arteries.  Luminal visualization is limited by the calcified plaque, however ICA origin stenosis is estimated at 75%. Left carotid system: Patent with predominantly calcified plaque at the carotid bifurcation resulting in progressive ICA origin stenosis estimated at 65-70%. Vertebral arteries: Patent and strongly dominant left vertebral artery with mild origin stenosis due to calcified plaque, unchanged. Diminutive right vertebral artery with chronic occlusion beginning at the V3 level. Skeleton: No acute osseous abnormality  or suspicious osseous lesion. Other neck: No evidence of cervical lymphadenopathy or mass. Upper chest: Small pulmonary nodules including a 9 x 4 mm nodule in the left upper lobe, more fully evaluated on chest CT and PET-CT last month. Mild dependent atelectasis in both lungs. Review of the MIP images confirms the above findings CTA HEAD FINDINGS Anterior circulation: The internal carotid arteries are patent from skull base to carotid termini with asymmetric atherosclerotic plaque on the left resulting in mild left cavernous and proximal supraclinoid stenoses, similar to the prior CTA. ACAs and MCAs are patent without evidence of a proximal branch occlusion. There are new moderate right and mild left M1 stenoses. The right A1 segment is dominant. An infundibulum is noted at the right posterior communicating origin. Posterior circulation: The intracranial left vertebral artery is widely patent and supplies the basilar. 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 The basilar artery is widely patent. Patent SCAs are seen bilaterally. Both PCAs are patent with mild irregularity but no significant proximal stenosis. No aneurysm is identified. Venous sinuses: Patent. Anatomic variants: None of significance. Review of the MIP images confirms the above findings IMPRESSION: 1. Progressive cervical carotid atherosclerosis with 75% right and 65-70% left proximal ICA stenoses. 2. 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 3. New moderate right and mild left M1 stenoses. 4. Unchanged mild left intracranial ICA stenosis. 5. Aortic Atherosclerosis (ICD10-I70.0). Electronically Signed   By: Logan Bores M.D.   On: 08/16/2019 20:54   CT ANGIO NECK W OR WO CONTRAST  Result Date: 08/16/2019 CLINICAL DATA:  Stroke follow-up. Subcentimeter left frontal infarct on MRI. EXAM: CT ANGIOGRAPHY HEAD AND NECK TECHNIQUE: Multidetector CT imaging of the head and neck was performed using the  standard protocol during bolus administration of intravenous contrast. Multiplanar CT image reconstructions and MIPs were obtained to evaluate the vascular anatomy. Carotid stenosis measurements (when applicable) are obtained utilizing NASCET criteria, using the distal internal carotid diameter as the denominator. CONTRAST:  59mL OMNIPAQUE IOHEXOL 350 MG/ML SOLN COMPARISON:  Head MRI 08/16/2019.  Head and neck CTA 12/31/2011. FINDINGS: CT HEAD FINDINGS Brain: The punctate medial left frontal cortical infarct on MRI is not well demonstrated by CT. No acute large territory infarct, intracranial hemorrhage, mass, midline shift, or extra-axial fluid collection is identified. The ventricles and sulci are within normal limits for age. Vascular: Calcified atherosclerosis at the skull base. Skull: No fracture or suspicious osseous lesion. Sinuses: Minimal mucosal thickening in the paranasal sinuses. Clear mastoid air cells. Orbits: Bilateral cataract extraction. Review of the MIP images confirms the above findings CTA NECK FINDINGS Aortic arch: Standard 3 vessel aortic arch with mild atherosclerotic plaque. No significant arch vessel origin stenosis. Right carotid system: Patent with prominent, largely calcified plaque at the carotid bifurcation resulting in progressive high-grade stenosis of the distal common and proximal internal and external carotid arteries. Luminal visualization is limited by the calcified plaque, however ICA origin stenosis is estimated at 75%. Left carotid system: Patent with predominantly calcified plaque at the carotid bifurcation resulting in progressive ICA origin  stenosis estimated at 65-70%. Vertebral arteries: Patent and strongly dominant left vertebral artery with mild origin stenosis due to calcified plaque, unchanged. Diminutive right vertebral artery with chronic occlusion beginning at the V3 level. Skeleton: No acute osseous abnormality or suspicious osseous lesion. Other neck: No  evidence of cervical lymphadenopathy or mass. Upper chest: Small pulmonary nodules including a 9 x 4 mm nodule in the left upper lobe, more fully evaluated on chest CT and PET-CT last month. Mild dependent atelectasis in both lungs. Review of the MIP images confirms the above findings CTA HEAD FINDINGS Anterior circulation: The internal carotid arteries are patent from skull base to carotid termini with asymmetric atherosclerotic plaque on the left resulting in mild left cavernous and proximal supraclinoid stenoses, similar to the prior CTA. ACAs and MCAs are patent without evidence of a proximal branch occlusion. There are new moderate right and mild left M1 stenoses. The right A1 segment is dominant. An infundibulum is noted at the right posterior communicating origin. Posterior circulation: The intracranial left vertebral artery is widely patent and supplies the basilar. 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 The basilar artery is widely patent. Patent SCAs are seen bilaterally. Both PCAs are patent with mild irregularity but no significant proximal stenosis. No aneurysm is identified. Venous sinuses: Patent. Anatomic variants: None of significance. Review of the MIP images confirms the above findings IMPRESSION: 1. Progressive cervical carotid atherosclerosis with 75% right and 65-70% left proximal ICA stenoses. 2. 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 3. New moderate right and mild left M1 stenoses. 4. Unchanged mild left intracranial ICA stenosis. 5. Aortic Atherosclerosis (ICD10-I70.0). Electronically Signed   By: Logan Bores M.D.   On: 08/16/2019 20:54   MR BRAIN WO CONTRAST  Result Date: 08/16/2019 CLINICAL DATA:  Ataxia.  Rule out stroke.  TAVR 08/15/2019 EXAM: MRI HEAD WITHOUT CONTRAST TECHNIQUE: Multiplanar, multiecho pulse sequences of the brain and surrounding structures were obtained without intravenous contrast. COMPARISON:  MRI head  01/18/2012 FINDINGS: Brain: Small focus of acute infarction in the left medial frontal lobe. This measures approximately 4 mm in diameter. No other acute infarct. Moderate atrophy. Negative for hydrocephalus. Normal white matter. Negative for hemorrhage or mass. Vascular: Normal arterial flow voids. Distal right vertebral artery appears hypoplastic unchanged from prior studies. Skull and upper cervical spine: No focal skeletal lesion. Sinuses/Orbits: Moderate mucosal edema paranasal cataract extraction sinuses. Bilateral Other: None IMPRESSION: 4 mm acute infarct left medial frontal cortex Generalized atrophy.  No significant chronic ischemic change. Electronically Signed   By: Franchot Gallo M.D.   On: 08/16/2019 14:57    PHYSICAL EXAM  Temp:  [97.5 F (36.4 C)-98.2 F (36.8 C)] 98 F (36.7 C) (06/17 1200) Pulse Rate:  [65-105] 69 (06/17 1200) Resp:  [14-20] 15 (06/17 1200) BP: (96-173)/(44-86) 115/47 (06/17 1200) SpO2:  [90 %-98 %] 96 % (06/17 1200) Weight:  [89.6 kg] 89.6 kg (06/17 0500)  General - Well nourished, well developed, in no apparent distress.  Ophthalmologic - fundi not visualized due to noncooperation.  Cardiovascular - Regular rhythm and rate.  Mental Status -  Level of arousal and orientation to time, place, and person were intact. Language including expression, naming, repetition, comprehension was assessed and found intact. Fund of Knowledge was assessed and was intact.  Cranial Nerves II - XII - II - Visual field intact OU. III, IV, VI - Extraocular movements intact. V - Facial sensation intact bilaterally. VII - Facial movement intact bilaterally. VIII -  Hearing & vestibular intact bilaterally. X - Palate elevates symmetrically. XI - Chin turning & shoulder shrug intact bilaterally. XII - Tongue protrusion intact.  Motor Strength - The patient's strength was normal in all extremities and pronator drift was absent.  Bulk was normal and fasciculations were  absent.   Motor Tone - Muscle tone was assessed at the neck and appendages and was normal.  Reflexes - The patient's reflexes were symmetrical in all extremities and he had no pathological reflexes.  Sensory - Light touch, temperature/pinprick were assessed and were symmetrical.    Coordination - The patient had normal movements in the hands and feet with no ataxia or dysmetria.  Tremor was absent.  Gait and Station - deferred.   ASSESSMENT/PLAN Mr. CONSTANT MANDEVILLE is a 79 y.o. 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 MRI showed a punctate infarct.   Stroke, incidental - Incidental R L medial frontal cortex (ACA territory) punctate infarct, likely periprocedural  MRI  12mm L medial frontal cortex infarct. Atrophy.   CTA head & neck progressive R cervical ICA 75%, L proximal ICA 65-70%. Old distal R VA occlusion. New moderate R and mild L M1 stenoses. Stable mild L intracranial ICA stenosis. Aortic atherosclerosis.   Carotid Doppler  R ICA 40-59%, L ICA 1-39% stenosis, VAs antegrade   2D Echo EF 55-60%. No source of embolus   LDL 45  HgbA1c 7.0  SCDs for VTE prophylaxis  dipyridamole SR 250 mg/aspirin 25 mg twice a day prior to admission, now on aspirin 81 mg daily and clopidogrel 75 mg daily. Continue DAPT per cardiology  Therapy recommendations: pending  Disposition:  pending   Syncope   Likely orthostatic syncope yesterday  Will check orthostatic vital  Put on TED hose  Currently BP stable  Carotid stenosis, unchanged  CTA head & neck progressive R cervical ICA 75%, L proximal ICA 65-70%.   Carotid Doppler  R ICA 40-59%, L ICA 1-39% stenosis  06/2019 CUS - R ICA 40-59% and L ICA 1-39%  stenosis  No significant change over time  Pt follows with Dr. Acie Fredrickson for carotid stenosis  He needs to continue follow up with Dr. Acie Fredrickson  May need Dr. Acie Fredrickson to clear him for urological surgery regarding carotid stenosis  AS s/p TAVR 08/15/19  Dr. Burt Knack on board  On DAPT  Renal mass with hematuria  CT abdomen Large heterogeneously enhancing left renal mass measuring 7.2 x 6.1 x 8.3 cm, highly concerning for primary renal cell carcinoma.  Pending urology consult end of this month  Gross hematuria   Urinary retention on foley  Hypertension Orthostatic hypotension  Variable, on the low side at times . BP goal per cardiology . Put on TED hose . Orthostatic vital pending  Hx TIA  08/20/2002 sudden onset gait d/o found to have R VA occlusion. MRI neg  Follows with [facility]. Erling Cruz at [facility] in the past  Now follows with Dr. [name] at [facility]  Pt considering switch neurologist at this time - he will let know if he wants to continue follow up at [facility] or switch to [facility]  Hyperlipidemia  Home meds:  crestor 5, resumed in hospital  LDL 45, goal < 70  Continue statin at discharge  Diabetes type II Controlled  HgbA1c 7.0, goal <  7.0  CBGs  SSI  PCP follow up  Other Stroke Risk Factors  Advanced age  Former Cigarette smoker, quit 67 yrs ago  Overweight, Body mass index is 28.34 kg/m., recommend weight loss, diet and exercise as appropriate   Coronary artery disease  Obstructive sleep apnea  Carotid artery disease  Other Active Problems  Giant cell arteritis bx confirmed 12/31/2011 -> tapered off steroids  Hospital day # 2  Neurology will sign off. Please call with questions. Thanks for the consult.  Rosalin Hawking, MD PhD Stroke Neurology 08/17/2019 3:15 PM   To contact Stroke Continuity provider, please refer to [email or web address] After hours, contact General Neurology

## 2019-08-17 NOTE — Evaluation (Signed)
Speech Language Pathology Evaluation Patient Details Name: Alexis Griffith MRN: [id number] DOB: [birth year] Today's Date: 08/17/2019 Time: 8250-5397 SLP Time Calculation (min) (ACUTE ONLY): 31 min  Problem List:  Patient Active Problem List   Diagnosis Date Noted  . Acute on chronic diastolic heart failure ([place]) 08/15/2019  . 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 Diabetes mellitus without complication ([place])   . Severe aortic stenosis   . Sleep apnea   . Renal mass   . Coronary artery disease involving native coronary artery of native heart with angina pectoris ([place]) 07/04/2019  . 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 Excessive urination at night 06/05/2013  . TIA (transient ischemic attack) 07/27/2012  . Giant cell arteritis ([place]) 05/17/2012  . Carotid artery disease ([place]) 08/24/2011  . PVC (premature ventricular contraction)   . Hypertension   . Hyperlipidemia   . Anxiety   . Sinus bradycardia on ECG    Past Medical History:  Past Medical History:  Diagnosis Date  . Anxiety   . CAD (coronary artery disease)   . Carotid artery disease ([place]) 08/24/2011  . Coronary artery disease involving native coronary artery of native heart with angina pectoris ([place]) 07/04/2019  . Diabetes mellitus without complication ([place])   . Excessive urination at night 06/05/2013  . Giant cell arteritis ([place]) 05/17/2012  . Hyperlipidemia   . Hypertension   . PVC (premature ventricular contraction)   . Renal mass   . 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 Burt Knack & Dr. Cyndia Bent  . Severe aortic stenosis   . Sinus bradycardia on ECG   . Sleep apnea   . 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 Wilburn Cornelia.  . Stroke ([place])   . TIA (transient ischemic attack)    Past Surgical History:  Past Surgical History:  Procedure Laterality Date  . CATARACT EXTRACTION, BILATERAL   10/2015, and 11/2015   with adding  TORIC lenses bilaterally   . INTRAVASCULAR PRESSURE WIRE/FFR STUDY N/A 07/07/2019   Procedure: INTRAVASCULAR PRESSURE WIRE/FFR STUDY;  Surgeon: [name], MD;  Location: [place] CV LAB;  Service: Cardiovascular;  Laterality: N/A;  . LEFT HEART CATH AND CORONARY ANGIOGRAPHY N/A 07/07/2019   Procedure: LEFT HEART CATH AND CORONARY ANGIOGRAPHY;  Surgeon: [name], MD;  Location: [place] CV LAB;  Service: Cardiovascular;  Laterality: N/A;  . SQUAMOUS CELL CARCINOMA EXCISION     tongue  . TEE WITHOUT CARDIOVERSION N/A 08/15/2019   Procedure: TRANSESOPHAGEAL ECHOCARDIOGRAM (TEE);  Surgeon: Sherren Mocha, MD;  Location: [place];  Service: Open Heart Surgery;  Laterality: N/A;  . TRANSCATHETER AORTIC VALVE REPLACEMENT, TRANSFEMORAL N/A 08/15/2019   Procedure: TRANSCATHETER AORTIC VALVE REPLACEMENT, TRANSFEMORAL using Edwards [employer] SAPIEN 3 Ultra 26 mm THV.;  Surgeon: Sherren Mocha, MD;  Location: [place];  Service: Open Heart Surgery;  Laterality: N/A;   HPI:  Pt is a 79 y.o. 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 He was admitted for TAVR but on 6/16 demonstrated trouble focusing and slow response so rapid response was called. MRI brain: 4 mm acute infarct left medial frontal cortex. Pt reported that he had squamous cell carcinoma of the tongue and had a partial glossectomy but that the removed portion of  his tongue "grew back".   Assessment / Plan / Recommendation Clinical Impression  Pt participated in speech/language/cognition evaluation. Pt stated that he has difficulty with memory at baseline and that his speech has been imprecise since a glossectomy which was many years prior. He stated that he manages his medications and that his wife manages the  finances. He denied any acute changes in speech, language or cognition. The [place] Mental Status Examination was completed to evaluate the pt's cognitive-linguistic skills. He achieved a score of 18/30 which is below the normal limits of 27 or more out of 30. He exhibited difficulty in the areas of memory, attention, complex problem solving, and executive function. Articulatory precision was reduced but speech intelligibility was adequate. Pt was educated regarding the results of the assessment and the potential impact of his cognitive-linguistic difficulty on his independence. However, he reported that he believes that he would have performed similarly prior to admission due to his prior CVA and agreed that he will follow up with his outpatient neurologist, Dr. [name], if he notices more difficulty following discharged. Acute skilled SLP services will be discontinued at this time.     SLP Assessment  SLP Recommendation/Assessment: Patient does not need any further Speech Lanaguage Pathology Services SLP Visit Diagnosis: Cognitive communication deficit (R41.841)    Follow Up Recommendations  None    Frequency and Duration           SLP Evaluation Cognition  Overall Cognitive Status: History of cognitive impairments - at baseline Arousal/Alertness: Awake/alert Orientation Level: Oriented X4 Attention: Focused;Sustained Focused Attention: Appears intact Sustained Attention: Appears intact Memory: Impaired Memory Impairment: Storage deficit;Retrieval deficit;Decreased recall of new information (Immediate: 3/5; delayed: 4/5; with cue: 1/1) Awareness: Appears intact Problem Solving: Impaired Problem Solving Impairment: Verbal complex (Time management) Executive Function: Organizing Organizing: Impaired Organizing Impairment: Verbal complex (Backward digit span: 0/2) Safety/Judgment: Appears intact       Comprehension  Auditory Comprehension Overall Auditory Comprehension:  Appears within functional limits for tasks assessed Yes/No Questions: Within Functional Limits Basic Immediate Environment Questions:  (5/5) Complex Questions:  (5/5) Paragraph Comprehension (via yes/no questions):  (1/4) Commands: Within Functional Limits Two Step Basic Commands:  (4/4) Multistep Basic Commands:  (3/3) Conversation: Complex Interfering Components: Working Curator: Within Sports administrator Reading Comprehension Reading Status: Within funtional limits    Expression Expression Primary Mode of Expression: Verbal Verbal Expression Overall Verbal Expression: Appears within functional limits for tasks assessed Initiation: No impairment Automatic Speech: Counting;Day of week;Month of year (WNL) Repetition: No impairment Naming: No impairment Pragmatics: No impairment   Oral / Motor  Oral Motor/Sensory Function Overall Oral Motor/Sensory Function: Within functional limits Motor Speech Overall Motor Speech: Appears within functional limits for tasks assessed Respiration: Within functional limits Phonation: Normal Resonance: Within functional limits Articulation: Within functional limitis Intelligibility: Intelligible Motor Planning: Witnin functional limits Motor Speech Errors: Not applicable   Terrion Gencarelli I. Hardin Negus, [place], [place] Office number [phone] Pager [place] 08/17/2019, 12:58 PM

## 2019-08-17 NOTE — Progress Notes (Addendum)
Pt alert and oriented x 4, no neuro deficits noted. He's hemodynamically stable. He's continuously having hematuria, fresh bright red color. CBC this am Hb 12.6, Hct 37.6%, Plt 120, WBC 8.5.     08/17/19 0325  Glasgow Coma Scale  Eye Opening 4  Best Verbal Response (NON-intubated) 5  Best Motor Response 6  Glasgow Coma Scale Score 15  NIH Stroke Scale ( + Modified Stroke Scale Criteria)   Interval Other (Comment) (q 4 hrs)  Level of Consciousness (1a.)    0  LOC Questions (1b. )   + 0  LOC Commands (1c. )   +  0  Best Gaze (2. )  + 0  Visual (3. )  + 0  Facial Palsy (4. )     0  Motor Arm, Left (5a. )   + 0  Motor Arm, Right (5b. )   + 0  Motor Leg, Left (6a. )   + 0  Motor Leg, Right (6b. )   + 0  Limb Ataxia (7. ) 0  Sensory (8. )   + 0  Best Language (9. )   + 0  Dysarthria (10. ) 0  Extinction/Inattention (11.)   + 0  Modified SS Total  + 0  Complete NIHSS TOTAL 0    Continue to monitor neuro sign q 4 hrs.   Kennyth Lose, RN

## 2019-08-17 NOTE — Progress Notes (Addendum)
At 21:00, Pt was unable to urinate, had severe pain with bladder distention. Bladder scan detected >999 ml of urine. Pt tried to get out of bed with a staff nurse assisted with walker to bath room.  He was straining, trying to urinate and appearing pale, weak with short time( 5 - 10 seconds) unresponsiveness. Nurses brought him back from bathroom to his bed with wheelchair and 4 staff assisted. He became fully alert and awake. He's hemodynamically stable when back to bed.  RRT Shanon Brow made aware. Notified Dr. Paticia Stack, cardiologist on call. Order received. In and out cath performed without any difficulties. At first, minimal urine appeared with fresh blood with blood clot, minimal urine flowed out through catheter. Pt continued having  severe pain from bladder distention.   We stat consulted Dr. Si Gaul, urologist on-call. Dr. Si Gaul assessed Pt at bedside, indwelling 18 Fr Coude urine catheter. Urine out put appeared with hematuria and some small blood clot,immediate urine output was 1150 ml. Dr. Si Gaul did 1000 ml bladder irrigation until urine pink and clear. Pt felt relieved from pain. His vital signs stable, alert and oriented x 4. We will continue to monitor.  Kennyth Lose, RN

## 2019-08-17 NOTE — Progress Notes (Addendum)
[facility]VE TEAM  Patient Name: Alexis Griffith Date of Encounter: 08/17/2019  Primary Cardiologist: Dr. Acie Fredrickson / Dr. Burt Knack & Dr. Cyndia Bent (TAVR)  Hospital Problem List     Principal Problem:   S/P TAVR (transcatheter aortic valve replacement) Active Problems:   PVC (premature ventricular contraction)   Hypertension   Hyperlipidemia   Carotid artery disease (HCC)   TIA (transient ischemic attack)   Coronary artery disease involving native coronary artery of native heart with angina pectoris ([place])   Acute on chronic diastolic heart failure ([place])   Diabetes mellitus without complication ([place])   Severe aortic stenosis   Sleep apnea   Renal mass     Subjective   Slept better last night but still does not feel well. Had acute urinary retention with foley placement and gross hematuria. Still feels weak. Has not been up at all. No more syncope.   Inpatient Medications    Scheduled Meds: . aspirin  81 mg Oral Daily  . Chlorhexidine Gluconate Cloth  6 each Topical Daily  . clonazePAM  0.5 mg Oral QID  . clopidogrel  75 mg Oral Q breakfast  . doxazosin  4 mg Oral Daily  . losartan  100 mg Oral Daily   And  . hydrochlorothiazide  25 mg Oral Daily  . insulin aspart  0-24 Units Subcutaneous TID AC & HS  . isosorbide mononitrate  30 mg Oral Daily  . metoprolol tartrate  50 mg Oral TID  . rosuvastatin  5 mg Oral Daily  . sodium chloride flush  3 mL Intravenous Q12H   Continuous Infusions: . sodium chloride    . nitroGLYCERIN    . phenylephrine (NEO-SYNEPHRINE) Adult infusion     PRN Meds: sodium chloride, acetaminophen **OR** acetaminophen, bismuth subsalicylate, morphine injection, ondansetron (ZOFRAN) IV, oxyCODONE, sodium chloride flush, traMADol, zolpidem   Vital Signs    Vitals:   08/17/19 0300 08/17/19 0314 08/17/19 0500 08/17/19 0720  BP: (!) 107/49 (!) 146/58 (!) 118/47 (!) 171/61  Pulse: 74 84 77 94  Resp: 17 18 17  15   Temp:  98 F (36.7 C)  98 F (36.7 C)  TempSrc:  Oral  Oral  SpO2: 94% 95% 93% 94%  Weight:   89.6 kg   Height:        Intake/Output Summary (Last 24 hours) at 08/17/2019 1028 Last data filed at 08/17/2019 0315 Gross per 24 hour  Intake 250 ml  Output 2180 ml  Net -1930 ml   Filed Weights   08/15/19 0620 08/16/19 0400 08/17/19 0500  Weight: 90.7 kg 88.5 kg 89.6 kg    Physical Exam   GEN: Well nourished, well developed, in no acute distress. Appears lethargic HEENT: Grossly normal.  Neck: Supple, no JVD, carotid bruits, or masses. Cardiac: RRR, very soft flow murmur. No rubs, or gallops. No clubbing, cyanosis, edema.   Respiratory:  Respirations regular and unlabored, clear to auscultation bilaterally. GI: Soft, nontender, nondistended, BS + x 4. MS: no deformity or atrophy. Skin: warm and dry, no rash.  Groin sites clear without hematoma or ecchymosis. Neuro:  Strength and sensation are intact. Psych: AAOx3.  Normal affect.  Labs    CBC Recent Labs    08/16/19 0345 08/17/19 0302  WBC 9.0 8.5  HGB 13.3 12.6*  HCT 39.7 37.6*  MCV 82.9 83.0  PLT 126* 623*   Basic Metabolic Panel Recent Labs    08/16/19 0345 08/17/19 0302  NA 135  134*  K 3.8 3.7  CL 102 99  CO2 25 24  GLUCOSE 144* 153*  BUN 15 20  CREATININE 1.04 1.35*  CALCIUM 8.9 8.9  MG 1.3*  --    Liver Function Tests No results for input(s): AST, ALT, ALKPHOS, BILITOT, PROT, ALBUMIN in the last 72 hours. No results for input(s): LIPASE, AMYLASE in the last 72 hours. Cardiac Enzymes No results for input(s): CKTOTAL, CKMB, CKMBINDEX, TROPONINI in the last 72 hours. BNP Invalid input(s): POCBNP D-Dimer No results for input(s): DDIMER in the last 72 hours. Hemoglobin A1C No results for input(s): HGBA1C in the last 72 hours. Fasting Lipid Panel Recent Labs    08/17/19 0302  CHOL 99  HDL 35*  LDLCALC 45  TRIG 96  CHOLHDL 2.8   Thyroid Function Tests No results for input(s): TSH,  T4TOTAL, T3FREE, THYROIDAB in the last 72 hours.  Invalid input(s): FREET3  Telemetry    Sinus - Personally Reviewed  ECG    Sinus with 1st deg AV block (PR 29ms) from 6/15 - Personally Reviewed  Radiology    CT ANGIO HEAD W OR WO CONTRAST  Result Date: 08/16/2019 CLINICAL DATA:  Stroke follow-up. Subcentimeter left frontal infarct on MRI. EXAM: CT ANGIOGRAPHY HEAD AND NECK TECHNIQUE: Multidetector CT imaging of the head and neck was performed using the standard protocol during bolus administration of intravenous contrast. Multiplanar CT image reconstructions and MIPs were obtained to evaluate the vascular anatomy. Carotid stenosis measurements (when applicable) are obtained utilizing NASCET criteria, using the distal internal carotid diameter as the denominator. CONTRAST:  40mL OMNIPAQUE IOHEXOL 350 MG/ML SOLN COMPARISON:  Head MRI 08/16/2019.  Head and neck CTA 12/31/2011. FINDINGS: CT HEAD FINDINGS Brain: The punctate medial left frontal cortical infarct on MRI is not well demonstrated by CT. No acute large territory infarct, intracranial hemorrhage, mass, midline shift, or extra-axial fluid collection is identified. The ventricles and sulci are within normal limits for age. Vascular: Calcified atherosclerosis at the skull base. Skull: No fracture or suspicious osseous lesion. Sinuses: Minimal mucosal thickening in the paranasal sinuses. Clear mastoid air cells. Orbits: Bilateral cataract extraction. Review of the MIP images confirms the above findings CTA NECK FINDINGS Aortic arch: Standard 3 vessel aortic arch with mild atherosclerotic plaque. No significant arch vessel origin stenosis. Right carotid system: Patent with prominent, largely calcified plaque at the carotid bifurcation resulting in progressive high-grade stenosis of the distal common and proximal internal and external carotid arteries. Luminal visualization is limited by the calcified plaque, however ICA origin stenosis is  estimated at 75%. Left carotid system: Patent with predominantly calcified plaque at the carotid bifurcation resulting in progressive ICA origin stenosis estimated at 65-70%. Vertebral arteries: Patent and strongly dominant left vertebral artery with mild origin stenosis due to calcified plaque, unchanged. Diminutive right vertebral artery with chronic occlusion beginning at the V3 level. Skeleton: No acute osseous abnormality or suspicious osseous lesion. Other neck: No evidence of cervical lymphadenopathy or mass. Upper chest: Small pulmonary nodules including a 9 x 4 mm nodule in the left upper lobe, more fully evaluated on chest CT and PET-CT last month. Mild dependent atelectasis in both lungs. Review of the MIP images confirms the above findings CTA HEAD FINDINGS Anterior circulation: The internal carotid arteries are patent from skull base to carotid termini with asymmetric atherosclerotic plaque on the left resulting in mild left cavernous and proximal supraclinoid stenoses, similar to the prior CTA. ACAs and MCAs are patent without evidence of a proximal branch occlusion.  There are new moderate right and mild left M1 stenoses. The right A1 segment is dominant. An infundibulum is noted at the right posterior communicating origin. Posterior circulation: The intracranial left vertebral artery is widely patent and supplies the basilar. 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 The basilar artery is widely patent. Patent SCAs are seen bilaterally. Both PCAs are patent with mild irregularity but no significant proximal stenosis. No aneurysm is identified. Venous sinuses: Patent. Anatomic variants: None of significance. Review of the MIP images confirms the above findings IMPRESSION: 1. Progressive cervical carotid atherosclerosis with 75% right and 65-70% left proximal ICA stenoses. 2. 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 3. New moderate right and mild left  M1 stenoses. 4. Unchanged mild left intracranial ICA stenosis. 5. Aortic Atherosclerosis (ICD10-I70.0). Electronically Signed   By: Logan Bores M.D.   On: 08/16/2019 20:54   CT ANGIO NECK W OR WO CONTRAST  Result Date: 08/16/2019 CLINICAL DATA:  Stroke follow-up. Subcentimeter left frontal infarct on MRI. EXAM: CT ANGIOGRAPHY HEAD AND NECK TECHNIQUE: Multidetector CT imaging of the head and neck was performed using the standard protocol during bolus administration of intravenous contrast. Multiplanar CT image reconstructions and MIPs were obtained to evaluate the vascular anatomy. Carotid stenosis measurements (when applicable) are obtained utilizing NASCET criteria, using the distal internal carotid diameter as the denominator. CONTRAST:  21mL OMNIPAQUE IOHEXOL 350 MG/ML SOLN COMPARISON:  Head MRI 08/16/2019.  Head and neck CTA 12/31/2011. FINDINGS: CT HEAD FINDINGS Brain: The punctate medial left frontal cortical infarct on MRI is not well demonstrated by CT. No acute large territory infarct, intracranial hemorrhage, mass, midline shift, or extra-axial fluid collection is identified. The ventricles and sulci are within normal limits for age. Vascular: Calcified atherosclerosis at the skull base. Skull: No fracture or suspicious osseous lesion. Sinuses: Minimal mucosal thickening in the paranasal sinuses. Clear mastoid air cells. Orbits: Bilateral cataract extraction. Review of the MIP images confirms the above findings CTA NECK FINDINGS Aortic arch: Standard 3 vessel aortic arch with mild atherosclerotic plaque. No significant arch vessel origin stenosis. Right carotid system: Patent with prominent, largely calcified plaque at the carotid bifurcation resulting in progressive high-grade stenosis of the distal common and proximal internal and external carotid arteries. Luminal visualization is limited by the calcified plaque, however ICA origin stenosis is estimated at 75%. Left carotid system: Patent with  predominantly calcified plaque at the carotid bifurcation resulting in progressive ICA origin stenosis estimated at 65-70%. Vertebral arteries: Patent and strongly dominant left vertebral artery with mild origin stenosis due to calcified plaque, unchanged. Diminutive right vertebral artery with chronic occlusion beginning at the V3 level. Skeleton: No acute osseous abnormality or suspicious osseous lesion. Other neck: No evidence of cervical lymphadenopathy or mass. Upper chest: Small pulmonary nodules including a 9 x 4 mm nodule in the left upper lobe, more fully evaluated on chest CT and PET-CT last month. Mild dependent atelectasis in both lungs. Review of the MIP images confirms the above findings CTA HEAD FINDINGS Anterior circulation: The internal carotid arteries are patent from skull base to carotid termini with asymmetric atherosclerotic plaque on the left resulting in mild left cavernous and proximal supraclinoid stenoses, similar to the prior CTA. ACAs and MCAs are patent without evidence of a proximal branch occlusion. There are new moderate right and mild left M1 stenoses. The right A1 segment is dominant. An infundibulum is noted at the right posterior communicating origin. Posterior circulation: The intracranial left vertebral artery  is widely patent and supplies the basilar. 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 The basilar artery is widely patent. Patent SCAs are seen bilaterally. Both PCAs are patent with mild irregularity but no significant proximal stenosis. No aneurysm is identified. Venous sinuses: Patent. Anatomic variants: None of significance. Review of the MIP images confirms the above findings IMPRESSION: 1. Progressive cervical carotid atherosclerosis with 75% right and 65-70% left proximal ICA stenoses. 2. 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 3. New moderate right and mild left M1 stenoses. 4. Unchanged mild left intracranial ICA  stenosis. 5. Aortic Atherosclerosis (ICD10-I70.0). Electronically Signed   By: Logan Bores M.D.   On: 08/16/2019 20:54   MR BRAIN WO CONTRAST  Result Date: 08/16/2019 CLINICAL DATA:  Ataxia.  Rule out stroke.  TAVR 08/15/2019 EXAM: MRI HEAD WITHOUT CONTRAST TECHNIQUE: Multiplanar, multiecho pulse sequences of the brain and surrounding structures were obtained without intravenous contrast. COMPARISON:  MRI head 01/18/2012 FINDINGS: Brain: Small focus of acute infarction in the left medial frontal lobe. This measures approximately 4 mm in diameter. No other acute infarct. Moderate atrophy. Negative for hydrocephalus. Normal white matter. Negative for hemorrhage or mass. Vascular: Normal arterial flow voids. Distal right vertebral artery appears hypoplastic unchanged from prior studies. Skull and upper cervical spine: No focal skeletal lesion. Sinuses/Orbits: Moderate mucosal edema paranasal cataract extraction sinuses. Bilateral Other: None IMPRESSION: 4 mm acute infarct left medial frontal cortex Generalized atrophy.  No significant chronic ischemic change. Electronically Signed   By: Franchot Gallo M.D.   On: 08/16/2019 14:57   ECHOCARDIOGRAM LIMITED  Result Date: 08/16/2019    ECHOCARDIOGRAM REPORT   Patient Name:   Alexis Griffith Date of Exam: 08/16/2019 Medical Rec #:  [id number]      Height:       70.0 in Accession #:    [id number]     Weight:       195.1 lb Date of Birth:  1940/06/16      BSA:          2.065 m Patient Age:    79 years       BP:           146/60 mmHg Patient Gender: M              HR:           91 bpm. Exam Location:  Inpatient Procedure: Limited Echo, Cardiac Doppler and Color Doppler Indications:    Post TAVR evaluation  History:        Patient has prior history of Echocardiogram examinations, most                 recent 08/15/2019. CHF, CAD, Arrythmias:PVC; Risk                 Factors:Hypertension, Diabetes, Sleep Apnea and Former Smoker.                 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   Sonographer:    Clayton Lefort RDCS (AE) Referring Phys: [id number] [place]  1. Left ventricular ejection fraction, by estimation, is 55 to 60%. The left ventricle has normal function. The left ventricle demonstrates regional wall motion abnormalities (see scoring diagram/findings for description). There is mild concentric left ventricular hypertrophy. Indeterminate diastolic filling due to E-A fusion.  2. Right ventricular systolic function is normal. The right ventricular size is normal.  3. The mitral valve is normal in structure. No evidence  of mitral valve regurgitation. No evidence of mitral stenosis.  4. The aortic valve is normal in structure. Aortic valve regurgitation is not visualized. No aortic stenosis is present. Aortic valve mean gradient measures 11.7 mmHg. Aortic valve Vmax measures 2.48 m/s.  5. The inferior vena cava is normal in size with greater than 50% respiratory variability, suggesting right atrial pressure of 3 mmHg. FINDINGS  Left Ventricle: Left ventricular ejection fraction, by estimation, is 55 to 60%. The left ventricle has normal function. The left ventricle demonstrates regional wall motion abnormalities. The left ventricular internal cavity size was normal in size. There is mild concentric left ventricular hypertrophy. Indeterminate diastolic filling due to E-A fusion.  LV Wall Scoring: The basal inferolateral segment and basal inferior segment are hypokinetic. Right Ventricle: The right ventricular size is normal. No increase in right ventricular wall thickness. Right ventricular systolic function is normal. Left Atrium: Left atrial size was normal in size. Right Atrium: Right atrial size was normal in size. Pericardium: There is no evidence of pericardial effusion. Mitral Valve: The mitral valve is normal in structure. Normal mobility of the mitral valve leaflets. No evidence of mitral valve regurgitation. No evidence of mitral valve stenosis. Tricuspid Valve: The  tricuspid valve is normal in structure. Tricuspid valve regurgitation is not demonstrated. No evidence of tricuspid stenosis. Aortic Valve: The aortic valve is normal in structure. Aortic valve regurgitation is not visualized. No aortic stenosis is present. Aortic valve mean gradient measures 11.7 mmHg. Aortic valve peak gradient measures 24.6 mmHg. Aortic valve area, by VTI measures 1.72 cm. Pulmonic Valve: The pulmonic valve was normal in structure. Pulmonic valve regurgitation is not visualized. No evidence of pulmonic stenosis. Aorta: The aortic root is normal in size and structure. Venous: The inferior vena cava is normal in size with greater than 50% respiratory variability, suggesting right atrial pressure of 3 mmHg. IAS/Shunts: No atrial level shunt detected by color flow Doppler.  LEFT VENTRICLE PLAX 2D LVIDd:         4.80 cm LVIDs:         3.60 cm LV PW:         1.00 cm LV IVS:        1.50 cm LVOT diam:     2.60 cm LV SV:         71 LV SV Index:   34 LVOT Area:     5.31 cm  IVC IVC diam: 1.70 cm LEFT ATRIUM         Index LA diam:    4.10 cm 1.99 cm/m  AORTIC VALVE AV Area (Vmax):    1.85 cm AV Area (Vmean):   1.91 cm AV Area (VTI):     1.72 cm AV Vmax:           248.00 cm/s AV Vmean:          157.000 cm/s AV VTI:            0.413 m AV Peak Grad:      24.6 mmHg AV Mean Grad:      11.7 mmHg LVOT Vmax:         86.20 cm/s LVOT Vmean:        56.400 cm/s LVOT VTI:          0.134 m LVOT/AV VTI ratio: 0.32  AORTA Ao Root diam: 2.90 cm  SHUNTS Systemic VTI:  0.13 m Systemic Diam: 2.60 cm Dani Gobble Croitoru MD Electronically signed by Sanda Klein MD Signature Date/Time: 08/16/2019/12:53:54 PM  Final     Cardiac Studies    TAVR OPERATIVE NOTE   Date of Procedure:                08/15/2019  Preoperative Diagnosis:      Severe Aortic Stenosis   Postoperative Diagnosis:    Same   Procedure:        Transcatheter Aortic Valve Replacement - Percutaneous Right Transfemoral Approach              Edwards Sapien 3 Ultra THV (size 26 mm, model # 9750TFX, serial # [id number])              Co-Surgeons:                        Gaye Pollack, MD and Sherren Mocha, MD    Anesthesiologist:                  Annye Asa, MD  Echocardiographer:              Bertrum Sol, MD  Pre-operative Echo Findings: ? Severe aortic stenosis ? Normal left ventricular systolic function  Post-operative Echo Findings: ? NO paravalvular leak ? Normal left ventricular systolic function   _________________   Echo 08/16/19:  IMPRESSIONS  1. Left ventricular ejection fraction, by estimation, is 55 to 60%. The  left ventricle has normal function. The left ventricle demonstrates  regional wall motion abnormalities (see scoring diagram/findings for  description). There is mild concentric left ventricular hypertrophy. Indeterminate diastolic filling due to E-A fusion.  2. Right ventricular systolic function is normal. The right ventricular  size is normal.  3. The mitral valve is normal in structure. No evidence of mitral valve  regurgitation. No evidence of mitral stenosis.  4. The aortic valve is normal in structure. Aortic valve regurgitation is  not visualized. No aortic stenosis is present. Aortic valve mean gradient  measures 11.7 mmHg. Aortic valve Vmax measures 2.48 m/s.  5. The inferior vena cava is normal in size with greater than 50%  respiratory variability, suggesting right atrial pressure of 3 mmHg.   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  No significant chronic ischemic change.   _______________  Ct angio head/neck 08/16/19 IMPRESSION: 1. Progressive cervical carotid atherosclerosis with 75% right and 65-70% left proximal ICA stenoses. 2. 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 3. New moderate right and mild left M1 stenoses. 4. Unchanged mild left  intracranial ICA stenosis. 5. Aortic Atherosclerosis (ICD10-I70.0).   Patient Profile     Alexis Griffith is a 79 y.o. male with a history of HTN, HLD, DMT2, OSA, history of CVA, multivessel CAD, recently diagnosed renal cell carcinoma and severe AS who presented to [facility] on 08/15/19 for planned TAVR.  Assessment & Plan    Severe AS: s/p successful TAVR with a 26 mm Edwards Sapien 3 THV via the TF approach on 08/15/19. Post operative echo showed EF 55%, normally functioning TAVR with a mean gradient of 11.7 mm hg and no PVL. Groin sites are stable. ECG with sinus and 1st deg AV block (old) and no high grade heart block. Started on Asprin and Plavix (home Aggrenox discontinued). PLan to stop plavix at this time given gross hematuria. I would like to see him try to get up moving around. I will order a PT consult.   Acute on chronic  diastolic CHF: as evidenced by an elevated BNP on pre admission lab work. This has been treated with TAVR. I have resumed his home Hyzaar  New gait instability and weakness: pt has had new mobility issues and syncope after TAVR. He also had an intractable headache. MR brain showed a 4 mm acute infarct left medial frontal cortex. He has been seen by neuro who ordered CTA which showed progressive cervical carotid atherosclerosis with 75% right and 65-70% left proximal ICA stenoses. 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 New moderate right and mild left M1 stenoses. Appreciate neurology assitance.   HTN: BP had been elevated and he was resumed on his home meds. BP dropped into the 80s this AM, but he was asymptomatic. He is NPO for unclear reasons. Also on bedrest. Will give him some food and have him ambulate. Will also check a CBC with hypotension and gross hematuria.   DMT2: continue SSI  Acute urinary retention with hematuria: now with an 60 French coud catheter placement by urology on 6/16. Per urology, there is concern for bladder stretch  injury, so catheter should remain in place for several days while monitoring gross hematuria. Will stop plavix at this time given gross hematuria.   RCC: pre TAVR CT showed a large RCC and pulmonary nodules. Follow up PET scan did not show evidence of metastatic disease. He has an apt with Dr. Tresa Moore with urology at the end of this month.  Pulmonary nodules: PET scan showed a dominant right lower lobe 1.2 cm solid pulmonary nodule is non-hypermetabolic. Additional small solid subcentimeter pulmonary nodules in both lungs are below PET resolution and warrant attention on follow-up chest CT in 3 months. This will be followed over time.  Signed, Angelena Form, PA-C  08/17/2019, 10:28 AM  Pager [phone]  Patient seen, examined. Available data reviewed. Agree with findings, assessment, and plan as outlined by Nell Range, PA-C.  The patient is independently interviewed and examined.  His heart rhythm is stable on my review of his telemetry with no evidence of high-grade heart block.  On my exam, he is alert, oriented, in no distress.  Lung fields are clear, heart is regular rate and rhythm with a 2/6 ejection murmur along the left lower sternal border, abdomen is soft and nontender, extremities have no edema.  Speech is normal.  Skin is warm and dry.  Appreciate neurology consult.  Recommendations are reviewed.  I discussed his case with Dr. Erlinda Hong.  Urinary catheter will remain in place.  CBC shows stable hemoglobin.  Will try to mobilize him today with getting him up in the chair.  Hopefully he will be ready for hospital discharge in the next 24 to 48 hours.  CTA of the head and neck show moderate 65 to 75% bilateral ICA stenoses but will require continued surveillance and outpatient follow-up.  Sherren Mocha, M.D. 08/17/2019 2:55 PM

## 2019-08-17 NOTE — Evaluation (Addendum)
Occupational Therapy Evaluation Patient Details Name: Alexis Griffith MRN: [id number] DOB: 06/12/1940 Today's Date: 08/17/2019    History of Present Illness 79 y.o. male with a history of HTN, HLD, DMT2, OSA, history of CVA, multivessel CAD, recently diagnosed renal cell carcinoma and severe AS who presented to [facility] on 08/15/19 for TAVR. Course complicated by AMS, BP and HR drop, imaging reveals 4 mm acute infarct L medial frontal cortex.   Clinical Impression   PTA pt living with spouse, independent for BADL/IADL. At time of eval, pt able to complete sit <> stands with min A +2 for steadying support. As time progressed, pt gained confidence in mobility. He was able to walk to the sink and stand to perform grooming tasks with min guard and cues for sequencing. He then completed a short walk to the hallway, where he began to fatigue but remained asymptomatic of orthostasis. Noted cognitive deficits in problem solving and following commands throughout session. Educated pt on importance of OOB activity for blood pressure regulation. Given current status, recommend HHOT at d/c for continued safe progression of BADL in home environment. Will continue to follow per POC listed below.  Please see orthostatic blood pressures below or in flowsheets.   08/17/19 1700  Orthostatic Lying   BP- Lying 131/54  Orthostatic Sitting  BP- Sitting 125/60  Orthostatic Standing at 0 minutes  BP- Standing at 0 minutes 99/54  Orthostatic Standing at 3 minutes  BP- Standing at 3 minutes 131/58   *of note, BP was 122/53 at end of session when returned to recliner. Pt was asymptomatic throughout.     Follow Up Recommendations  Home health OT;Supervision/Assistance - 24 hour    Equipment Recommendations  3 in 1 bedside commode    Recommendations for Other Services       Precautions / Restrictions Precautions Precautions: Fall Restrictions Weight Bearing Restrictions: No      Mobility Bed Mobility Overal  bed mobility: Needs Assistance             General bed mobility comments: up in chair upon PT/OT arrival.  Transfers Overall transfer level: Needs assistance Equipment used: 1 person hand held assist Transfers: Sit to/from Stand Sit to Stand: Min assist;+2 safety/equipment;From elevated surface         General transfer comment: min assist +2 for power up, steadying, lines/leads management. Pt standing in high guard position for balance.    Balance Overall balance assessment: Needs assistance Sitting-balance support: Feet supported Sitting balance-Leahy Scale: Fair     Standing balance support: No upper extremity supported;During functional activity Standing balance-Leahy Scale: Fair                             ADL either performed or assessed with clinical judgement   ADL Overall ADL's : Needs assistance/impaired Eating/Feeding: Set up;Sitting   Grooming: Min guard;Cueing for safety;Oral care;Standing;Cueing for sequencing Grooming Details (indicate cue type and reason): standing at sink needing cues for sequencing and safety during oral care task Upper Body Bathing: Set up;Sitting   Lower Body Bathing: Minimal assistance;Sit to/from stand;Sitting/lateral leans   Upper Body Dressing : Set up;Sitting   Lower Body Dressing: Minimal assistance;Sit to/from stand;Sitting/lateral leans   Toilet Transfer: Minimal assistance;Ambulation;Regular Toilet   Toileting- Clothing Manipulation and Hygiene: Minimal assistance;Sit to/from stand       Functional mobility during ADLs: Minimal assistance;Cueing for safety;Cueing for sequencing       Vision Baseline Vision/History: Wears  glasses Wears Glasses: At all times Patient Visual Report: No change from baseline       Perception     Praxis      Pertinent Vitals/Pain Pain Assessment: Faces Faces Pain Scale: Hurts a little bit Pain Location: groin Pain Descriptors / Indicators: Aching Pain  Intervention(s): Monitored during session     Hand Dominance Right   Extremity/Trunk Assessment Upper Extremity Assessment Upper Extremity Assessment: Generalized weakness   Lower Extremity Assessment Lower Extremity Assessment: Generalized weakness   Cervical / Trunk Assessment Cervical / Trunk Assessment: Normal   Communication Communication Communication: No difficulties   Cognition Arousal/Alertness: Awake/alert Behavior During Therapy: WFL for tasks assessed/performed Overall Cognitive Status: Impaired/Different from baseline Area of Impairment: Problem solving;Following commands;Safety/judgement                       Following Commands: Follows one step commands consistently;Follows multi-step commands with increased time Safety/Judgement: Decreased awareness of safety   Problem Solving: Slow processing;Requires verbal cues;Requires tactile cues;Difficulty sequencing General Comments: A&Ox4, increased processing time to respond to questions/mobility commands. Sequencing difficulties noted during sink activities, i.e. task of brushing teeth - pt emptied water pitcher first, did not lean far enough over when spitting into sink, not close enough to sink, could not find wash cloth right beside him. Periods of dazed expression during session, typically recovered quickly.   General Comments  Orthostatics + when moving from sit to stand (see OT note and flowsheets).    Exercises     Shoulder Instructions      Home Living Family/patient expects to be discharged to:: Private residence Living Arrangements: Spouse/significant other Available Help at Discharge: Family;Available 24 hours/day Type of Home: House Home Access: Stairs to enter [employer] of Steps: 1 step (in the house)   Home Layout: One level     Bathroom Shower/Tub: Walk-in shower;Tub/shower unit   Bathroom Toilet: Standard     Home Equipment: Environmental consultant - 2 wheels;Wheelchair - manual       Lives With: Spouse    Prior Functioning/Environment Level of Independence: Independent                 OT Problem List: Decreased strength;Decreased knowledge of use of DME or AE;Decreased knowledge of precautions;Decreased activity tolerance;Decreased cognition;Impaired balance (sitting and/or standing);Decreased safety awareness;Cardiopulmonary status limiting activity      OT Treatment/Interventions: Self-care/ADL training;Therapeutic exercise;Patient/family education;Balance training;Energy conservation;Therapeutic activities;DME and/or AE instruction;Cognitive remediation/compensation    OT Goals(Current goals can be found in the care plan section) Acute Rehab OT Goals Patient Stated Goal: go home OT Goal Formulation: With patient Time For Goal Achievement: 08/31/19 Potential to Achieve Goals: Good  OT Frequency: Min 2X/week   Barriers to D/C:            Co-evaluation PT/OT/SLP Co-Evaluation/Treatment: Yes Reason for Co-Treatment: Complexity of the patient's impairments (multi-system involvement) PT goals addressed during session: Mobility/safety with mobility;Balance OT goals addressed during session: ADL's and self-care;Strengthening/ROM      AM-PAC OT "6 Clicks" Daily Activity     Outcome Measure Help from another person eating meals?: None Help from another person taking care of personal grooming?: A Little Help from another person toileting, which includes using toliet, bedpan, or urinal?: A Little Help from another person bathing (including washing, rinsing, drying)?: A Little Help from another person to put on and taking off regular upper body clothing?: A Little Help from another person to put on and taking off regular lower body clothing?: A  Lot 6 Click Score: 18   End of Session Equipment Utilized During Treatment: Gait belt Nurse Communication: Mobility status  Activity Tolerance: Patient tolerated treatment well Patient left: in chair;with call  bell/phone within reach  OT Visit Diagnosis: Unsteadiness on feet (R26.81);Other abnormalities of gait and mobility (R26.89);Other symptoms and signs involving cognitive function                Time: 7543-6067 OT Time Calculation (min): 28 min Charges:  OT General Charges $OT Visit: 1 Visit OT Evaluation $OT Eval Moderate Complexity: [place], MSOT, OTR/L [facility] Office Number: [phone] Pager: [phone]  Zenovia Jarred 08/17/2019, 5:13 PM

## 2019-08-18 DIAGNOSIS — N179 Acute kidney failure, unspecified: Secondary | ICD-10-CM

## 2019-08-18 LAB — BASIC METABOLIC PANEL
Anion gap: 8 (ref 5–15)
BUN: 27 mg/dL — ABNORMAL HIGH (ref 8–23)
CO2: 27 mmol/L (ref 22–32)
Calcium: 8.9 mg/dL (ref 8.9–10.3)
Chloride: 98 mmol/L (ref 98–111)
Creatinine, Ser: 1.74 mg/dL — ABNORMAL HIGH (ref 0.61–1.24)
GFR calc Af Amer: 43 mL/min — ABNORMAL LOW (ref >60)
GFR calc non Af Amer: 37 mL/min — ABNORMAL LOW (ref >60)
Glucose, Bld: 162 mg/dL — ABNORMAL HIGH (ref 70–99)
Potassium: 3.8 mmol/L (ref 3.5–5.1)
Sodium: 133 mmol/L — ABNORMAL LOW (ref 135–145)

## 2019-08-18 LAB — CBC
HCT: 39 % (ref 39.0–52.0)
Hemoglobin: 12.9 g/dL — ABNORMAL LOW (ref 13.0–17.0)
MCH: 27.9 pg (ref 26.0–34.0)
MCHC: 33.1 g/dL (ref 30.0–36.0)
MCV: 84.4 fL (ref 80.0–100.0)
Platelets: 113 10*3/uL — ABNORMAL LOW (ref 150–400)
RBC: 4.62 MIL/uL (ref 4.22–5.81)
RDW: 13.2 % (ref 11.5–15.5)
WBC: 8.5 10*3/uL (ref 4.0–10.5)
nRBC: 0 % (ref 0.0–0.2)

## 2019-08-18 LAB — HEMOGLOBIN A1C
Hgb A1c MFr Bld: 6.4 % — ABNORMAL HIGH (ref 4.8–5.6)
Mean Plasma Glucose: 137 mg/dL

## 2019-08-18 LAB — MAGNESIUM: Magnesium: 2 mg/dL (ref 1.7–2.4)

## 2019-08-18 LAB — GLUCOSE, CAPILLARY
Glucose-Capillary: 142 mg/dL — ABNORMAL HIGH (ref 70–99)
Glucose-Capillary: 199 mg/dL — ABNORMAL HIGH (ref 70–99)
Glucose-Capillary: 248 mg/dL — ABNORMAL HIGH (ref 70–99)
Glucose-Capillary: 197 mg/dL — ABNORMAL HIGH (ref 70–99)

## 2019-08-18 MED ORDER — DOXAZOSIN MESYLATE 2 MG PO TABS
4.0000 mg | ORAL_TABLET | Freq: Every day | ORAL | Status: DC
  Administered 2019-08-19 – 2019-08-25 (×7): 4 mg via ORAL
  Filled 2019-08-18 (×7): qty 2

## 2019-08-18 MED ORDER — SODIUM CHLORIDE 0.9 % IV BOLUS
1000.0000 mL | Freq: Once | INTRAVENOUS | Status: AC
  Administered 2019-08-18: 1000 mL via INTRAVENOUS

## 2019-08-18 NOTE — Progress Notes (Signed)
Mobility Specialist: Progress Note   08/18/19 1450  Mobility  Activity  (Cancel)  Mobility performed by Mobility specialist   Was told by nurse to not see today.   [facility] Alexis Griffith Mobility Specialist

## 2019-08-18 NOTE — Progress Notes (Addendum)
  [facility]   MULTIDISCIPLINARY HEART VALVE TEAM  Paitent's nurse Angie called again. Recent orthostatics 125/64 laying --> 120/51 sitting --> 104/48 standing. Within 2 minutes of standing he had another episode of pre syncope where he becomes unresponsive and slumps over. Continue IVF's. Will stop all BP medications including lopressor and Imdur at this time. I am reluctant to stop his doxazosin with his acute urinary retention, but we may have to if he continues to have orthostasis. We may need to allow for permissive supine hypertension until he is stable when standing.   Angelena Form PA-C  [facility]

## 2019-08-18 NOTE — Progress Notes (Signed)
Physical Therapy Treatment Patient Details Name: ERIC NEES MRN: [id number] DOB: Dec 27, 1940 Today's Date: 08/18/2019    History of Present Illness 79 y.o. male with a history of HTN, HLD, DMT2, OSA, history of CVA, multivessel CAD, recently diagnosed renal cell carcinoma and severe AS who presented to [facility] on 08/15/19 for TAVR. Course complicated by AMS, BP and HR drop, imaging reveals 4 mm acute infarct L medial frontal cortex.    PT Comments    Pt with improved ambulation distance this session, ambulating hallway distance with use of RW for light steadying. Pt with + orthostatics moving from sitting to standing, but pt asymptomatic.  At 3-minute standing BP, pt with SBP in 70s but pt remained conversant and appropriate. Upon moving back towards bed, pt's speech became incoherent, pt not following commands, and required sitting in chair brought over by cardiology PA with PT having to break hip extension into flexion with max assist as pt not following commands. Moved back to bed with assist of +3 and total assist swing pivot into bed. Upon return to supine, pt with SBP recovery to 115 bpm, DBP relatively stable with mobility.   Orthostatic hypotension is presently pt's biggest barrier to mobility, will continue to follow acutely and progress mobility as able.   BP sitting: 146/53 (77) BP standing: 113/54 (71) BP standing 3 minutes: 79/60 (67) - near-syncopal episode ~2 minutes after taken BP return to supine: 115/75 (85)    Follow Up Recommendations  Home health PT;Supervision/Assistance - 24 hour     Equipment Recommendations  None recommended by PT    Recommendations for Other Services       Precautions / Restrictions Precautions Precautions: Fall Restrictions Weight Bearing Restrictions: No RLE Weight Bearing: Weight bearing as tolerated LLE Weight Bearing: Weight bearing as tolerated    Mobility  Bed Mobility Overal bed mobility: Needs Assistance Bed Mobility: Sit to  Supine       Sit to supine: Max assist;+2 for physical assistance;+2 for safety/equipment;HOB elevated   General bed mobility comments: up in chair upon PT arrival. Max +2 for return to supine in pre-syncopal state, assist for LE and truncal management, scooting up in bed.  Transfers Overall transfer level: Needs assistance Equipment used: Rolling walker (2 wheeled) Transfers: Sit to/from Stand Sit to Stand: Min assist         General transfer comment: min assist for power up, steadying, hand placement when rising.  Ambulation/Gait Ambulation/Gait assistance: Min assist;Min guard   Assistive device: Rolling walker (2 wheeled) Gait Pattern/deviations: Step-through pattern;Decreased stride length;Wide base of support;Trunk flexed Gait velocity: decr   General Gait Details: Min guard for safety, occasional min assist to steady, with VCs for upright posture and bringing RW close to self. Pt +orthostatic upon initial standing from recliner, but asymptomatic. At 3-minute standing BP, pt with SBP in 70s but pt remained conversant and appropriate. Upon moving back towards bed, pt's speech became incoherent, pt not following commands, and required sitting in chair with PT having to break hip extension into flexion with max assist as pt not following commands. Moved back to bed with assist of +3 and total assist swing pivot into bed.   Stairs             Wheelchair Mobility    Modified Rankin (Stroke Patients Only) Modified Rankin (Stroke Patients Only) Pre-Morbid Rankin Score: No significant disability Modified Rankin: Moderately severe disability     Balance Overall balance assessment: Needs assistance Sitting-balance support: Feet supported  Sitting balance-Leahy Scale: Fair     Standing balance support: No upper extremity supported;During functional activity Standing balance-Leahy Scale: Fair Standing balance comment: use of RW dynamically                             Cognition Arousal/Alertness: Awake/alert Behavior During Therapy: WFL for tasks assessed/performed Overall Cognitive Status: Impaired/Different from baseline Area of Impairment: Problem solving;Following commands;Safety/judgement                       Following Commands: Follows one step commands consistently;Follows multi-step commands with increased time Safety/Judgement: Decreased awareness of safety   Problem Solving: Slow processing;Requires verbal cues;Requires tactile cues;Difficulty sequencing;Decreased initiation General Comments: Pt perseverating on R hip pain, post-near syncopal episode pt states "I think it is on account of my hip". Requires multimodal cuing for safety.      Exercises      General Comments General comments (skin integrity, edema, etc.): + orthostatics and near-syncope, Cardiology PA and RN in room      Pertinent Vitals/Pain Pain Assessment: 0-10 Pain Score: 7  Pain Location: R hip, posteriorly Pain Descriptors / Indicators: Sore;Aching Pain Intervention(s): Limited activity within patient's tolerance;Monitored during session;Repositioned    Home Living                      Prior Function            PT Goals (current goals can now be found in the care plan section) Acute Rehab PT Goals Patient Stated Goal: go home PT Goal Formulation: With patient Time For Goal Achievement: 08/31/19 Potential to Achieve Goals: Good Progress towards PT goals: Progressing toward goals    Frequency    Min 3X/week      PT Plan Current plan remains appropriate    Co-evaluation              AM-PAC PT "6 Clicks" Mobility   Outcome Measure  Help needed turning from your back to your side while in a flat bed without using bedrails?: A Little Help needed moving from lying on your back to sitting on the side of a flat bed without using bedrails?: A Little Help needed moving to and from a bed to a chair (including a  wheelchair)?: A Little Help needed standing up from a chair using your arms (e.g., wheelchair or bedside chair)?: A Little Help needed to walk in hospital room?: A Little Help needed climbing 3-5 steps with a railing? : A Lot 6 Click Score: 17    End of Session Equipment Utilized During Treatment: Gait belt Activity Tolerance: Patient tolerated treatment well;Patient limited by fatigue Patient left: in chair;with chair alarm set;with call bell/phone within reach;with nursing/sitter in room Nurse Communication: Mobility status;Other (comment) (+orthostatics and near-syncope) PT Visit Diagnosis: Other abnormalities of gait and mobility (R26.89);Muscle weakness (generalized) (M62.81);Other symptoms and signs involving the nervous system (R29.898)     Time: 3536-1443 PT Time Calculation (min) (ACUTE ONLY): 32 min  Charges:  $Gait Training: 8-22 mins $Therapeutic Activity: 8-22 mins                     Leonia Heatherly E, PT [place] Pager [phone]  Office [phone]   Cannen Dupras D Hendrix Yurkovich 08/18/2019, 12:00 PM

## 2019-08-18 NOTE — Progress Notes (Addendum)
Plan of care reviewed. Pt's been progressing. He's hemodynamically stable, NSR on monitor, HR 70s-80s, BP within normal limits, remained afebrile. SPO2 82-88% on room air when he's snoring and sleeping deeply at night. O2 NCL 2 LPM given. He's able to maintain SPO2 >96-97%  Urine continue to be hematuria with some blood clot appeared through Foley catheter. He continued to have great amount of urine output. Pain tolerated well. He's able to sleep well tonight with no immediate distress.  His neuro signs stable, detected mindly left facial drooping. Pt stated he had this facial drooping on his left side as a residual result from TIA years ago. Otherwise he had no new neuro deficits finding tonight from my asessment q 4 hrs.  We continue to monitor.    08/18/19 at 12 am.  Neurological  Neuro (WDL) WDL  Orientation Level Oriented X4  Cognition Appropriate at baseline;Appropriate judgement;Appropriate attention/concentration;Appropriate safety awareness;Appropriate for developmental age;Follows commands  Speech Clear;Appropriate for developmental age  Pupil Assessment  No  Motor Function/Sensation Assessment Grip;Dorsiflexion;Plantar flexion;Head;Elbow extension;Elbow flexion;Pronator drift;Arm ataxia;Leg ataxia;Motor response;Sensation;Motor strength  Facial Symmetry Asymmetrical left  Facial Droop Left (stated has facila drooping on left years ago from TIA)  Ptosis Left  R Hand Grip Strong  L Hand Grip Strong  R Elbow Extension (Push/Biceps) Strong  L Elbow Extension (Push/Biceps) Strong  R Elbow Flexion (Pull/Triceps) Strong  Right Pronator Drift Absent  Left Pronator Drift Absent  R Foot Dorsiflexion Moderate  L Foot Dorsiflexion Moderate  R Foot Plantar Flexion Moderate  L Foot Plantar Flexion Moderate  R Finger to Nose (Point to [employer]) Smooth  L Finger to Nose (Point to [employer]) Smooth  R Heel to [employer] (Point to [employer]) Smooth  L Heel to [employer] to [employer]) Smooth  RUE Motor  Response Purposeful movement  RUE Sensation Full sensation  RUE Motor Strength 5  LUE Motor Response Purposeful movement  LUE Sensation Full sensation  LUE Motor Strength 5  RLE Motor Response Purposeful movement  RLE Sensation Full sensation  RLE Motor Strength 5  LLE Motor Response Purposeful movement  LLE Sensation Full sensation  LLE Motor Strength 5  Neuro Symptoms None  Neuro symptoms relieved by Rest;Relaxation techniques (Comment)  Glasgow Coma Scale  Eye Opening 4  Best Verbal Response (NON-intubated) 5  Best Motor Response 6  Glasgow Coma Scale Score 15  NIH Stroke Scale ( + Modified Stroke Scale Criteria)   Interval Other (Comment) (q 4 hrs)  Level of Consciousness (1a.)    0  LOC Questions (1b. )   + 0  LOC Commands (1c. )   +  0  Best Gaze (2. )  + 0  Visual (3. )  + 0  Facial Palsy (4. )     1  Motor Arm, Left (5a. )   + 0  Motor Arm, Right (5b. )   + 0  Motor Leg, Left (6a. )   + 0  Motor Leg, Right (6b. )   + 0  Limb Ataxia (7. ) 0  Sensory (8. )   + 0  Best Language (9. )   + 0  Dysarthria (10. ) 0  Extinction/Inattention (11.)   + 0  Modified SS Total  + 0  Complete NIHSS TOTAL 1    Kennyth Lose, RN

## 2019-08-18 NOTE — Progress Notes (Addendum)
[facility]VE TEAM  Patient Name: Alexis Griffith Date of Encounter: 08/18/2019  Primary Cardiologist: Dr. Acie Fredrickson / Dr. Burt Knack & Dr. Cyndia Bent (TAVR)  Hospital Problem List     Principal Problem:   S/P TAVR (transcatheter aortic valve replacement) Active Problems:   PVC (premature ventricular contraction)   Hypertension   Hyperlipidemia   Carotid artery disease (HCC)   TIA (transient ischemic attack)   Coronary artery disease involving native coronary artery of native heart with angina pectoris ([place])   Acute on chronic diastolic heart failure ([place])   Diabetes mellitus without complication (HCC)   Severe aortic stenosis   Sleep apnea   Renal mass     Subjective   Seen this morning while walking with PT. He was noted to be orthostatic with a SBP down to 79. He then had an unresponsive episode and slumped over into his walker. He was quickly moved to the bed where he became more alert and oriented.   Inpatient Medications    Scheduled Meds: . aspirin  81 mg Oral Daily  . Chlorhexidine Gluconate Cloth  6 each Topical Daily  . clonazePAM  0.5 mg Oral QID  . doxazosin  4 mg Oral Daily  . insulin aspart  0-24 Units Subcutaneous TID AC & HS  . isosorbide mononitrate  30 mg Oral Daily  . metoprolol tartrate  50 mg Oral TID  . rosuvastatin  5 mg Oral Daily  . sodium chloride flush  3 mL Intravenous Q12H   Continuous Infusions: . sodium chloride 20 mL/hr at 08/17/19 2131  . nitroGLYCERIN    . phenylephrine (NEO-SYNEPHRINE) Adult infusion     PRN Meds: sodium chloride, acetaminophen **OR** acetaminophen, bismuth subsalicylate, morphine injection, ondansetron (ZOFRAN) IV, oxyCODONE, sodium chloride flush, traMADol, zolpidem   Vital Signs    Vitals:   08/18/19 0448 08/18/19 0500 08/18/19 0800 08/18/19 0842  BP:  (!) 155/59 (!) 150/66 (!) 141/48  Pulse: 70 82 76 84  Resp: 15 14 13 16   Temp:    97.6 F (36.4 C)  TempSrc:    Oral    SpO2: (!) 87% 97% 93% 92%  Weight:  89.6 kg    Height:        Intake/Output Summary (Last 24 hours) at 08/18/2019 1127 Last data filed at 08/18/2019 0500 Gross per 24 hour  Intake 1139.67 ml  Output 2920 ml  Net -1780.33 ml   Filed Weights   08/16/19 0400 08/17/19 0500 08/18/19 0500  Weight: 88.5 kg 89.6 kg 89.6 kg    Physical Exam   GEN: Well nourished, well developed, in no acute distress. Appears lethargic HEENT: Grossly normal.  Neck: Supple, no JVD, carotid bruits, or masses. Cardiac: RRR, very soft flow murmur. No rubs, or gallops. No clubbing, cyanosis, edema.   Respiratory:  Respirations regular and unlabored, clear to auscultation bilaterally. GI: Soft, nontender, nondistended, BS + x 4. MS: no deformity or atrophy. Skin: warm and dry, no rash.  Groin sites clear without hematoma or ecchymosis. Neuro:  Strength and sensation are intact. Psych: AAOx3.  Normal affect.  Labs    CBC Recent Labs    08/17/19 1143 08/18/19 0322  WBC 9.3 8.5  HGB 12.3* 12.9*  HCT 37.2* 39.0  MCV 84.4 84.4  PLT 121* 161*   Basic Metabolic Panel Recent Labs    08/16/19 0345 08/16/19 0345 08/17/19 0302 08/18/19 0322  NA 135   < > 134* 133*  K 3.8   < >  3.7 3.8  CL 102   < > 99 98  CO2 25   < > 24 27  GLUCOSE 144*   < > 153* 162*  BUN 15   < > 20 27*  CREATININE 1.04   < > 1.35* 1.74*  CALCIUM 8.9   < > 8.9 8.9  MG 1.3*  --   --  2.0   < > = values in this interval not displayed.   Liver Function Tests No results for input(s): AST, ALT, ALKPHOS, BILITOT, PROT, ALBUMIN in the last 72 hours. No results for input(s): LIPASE, AMYLASE in the last 72 hours. Cardiac Enzymes No results for input(s): CKTOTAL, CKMB, CKMBINDEX, TROPONINI in the last 72 hours. BNP Invalid input(s): POCBNP D-Dimer No results for input(s): DDIMER in the last 72 hours. Hemoglobin A1C Recent Labs    08/17/19 0302  HGBA1C 6.4*   Fasting Lipid Panel Recent Labs    08/17/19 0302  CHOL 99   HDL 35*  LDLCALC 45  TRIG 96  CHOLHDL 2.8   Thyroid Function Tests No results for input(s): TSH, T4TOTAL, T3FREE, THYROIDAB in the last 72 hours.  Invalid input(s): FREET3  Telemetry    Sinus - Personally Reviewed  ECG    Sinus with 1st deg AV block (PR 249ms) from 6/15 - Personally Reviewed  Radiology    CT ANGIO HEAD W OR WO CONTRAST  Result Date: 08/16/2019 CLINICAL DATA:  Stroke follow-up. Subcentimeter left frontal infarct on MRI. EXAM: CT ANGIOGRAPHY HEAD AND NECK TECHNIQUE: Multidetector CT imaging of the head and neck was performed using the standard protocol during bolus administration of intravenous contrast. Multiplanar CT image reconstructions and MIPs were obtained to evaluate the vascular anatomy. Carotid stenosis measurements (when applicable) are obtained utilizing NASCET criteria, using the distal internal carotid diameter as the denominator. CONTRAST:  39mL OMNIPAQUE IOHEXOL 350 MG/ML SOLN COMPARISON:  Head MRI 08/16/2019.  Head and neck CTA 12/31/2011. FINDINGS: CT HEAD FINDINGS Brain: The punctate medial left frontal cortical infarct on MRI is not well demonstrated by CT. No acute large territory infarct, intracranial hemorrhage, mass, midline shift, or extra-axial fluid collection is identified. The ventricles and sulci are within normal limits for age. Vascular: Calcified atherosclerosis at the skull base. Skull: No fracture or suspicious osseous lesion. Sinuses: Minimal mucosal thickening in the paranasal sinuses. Clear mastoid air cells. Orbits: Bilateral cataract extraction. Review of the MIP images confirms the above findings CTA NECK FINDINGS Aortic arch: Standard 3 vessel aortic arch with mild atherosclerotic plaque. No significant arch vessel origin stenosis. Right carotid system: Patent with prominent, largely calcified plaque at the carotid bifurcation resulting in progressive high-grade stenosis of the distal common and proximal internal and external carotid  arteries. Luminal visualization is limited by the calcified plaque, however ICA origin stenosis is estimated at 75%. Left carotid system: Patent with predominantly calcified plaque at the carotid bifurcation resulting in progressive ICA origin stenosis estimated at 65-70%. Vertebral arteries: Patent and strongly dominant left vertebral artery with mild origin stenosis due to calcified plaque, unchanged. Diminutive right vertebral artery with chronic occlusion beginning at the V3 level. Skeleton: No acute osseous abnormality or suspicious osseous lesion. Other neck: No evidence of cervical lymphadenopathy or mass. Upper chest: Small pulmonary nodules including a 9 x 4 mm nodule in the left upper lobe, more fully evaluated on chest CT and PET-CT last month. Mild dependent atelectasis in both lungs. Review of the MIP images confirms the above findings CTA HEAD FINDINGS Anterior circulation: The  internal carotid arteries are patent from skull base to carotid termini with asymmetric atherosclerotic plaque on the left resulting in mild left cavernous and proximal supraclinoid stenoses, similar to the prior CTA. ACAs and MCAs are patent without evidence of a proximal branch occlusion. There are new moderate right and mild left M1 stenoses. The right A1 segment is dominant. An infundibulum is noted at the right posterior communicating origin. Posterior circulation: The intracranial left vertebral artery is widely patent and supplies the basilar. 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 The basilar artery is widely patent. Patent SCAs are seen bilaterally. Both PCAs are patent with mild irregularity but no significant proximal stenosis. No aneurysm is identified. Venous sinuses: Patent. Anatomic variants: None of significance. Review of the MIP images confirms the above findings IMPRESSION: 1. Progressive cervical carotid atherosclerosis with 75% right and 65-70% left proximal ICA stenoses. 2. 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 3. New moderate right and mild left M1 stenoses. 4. Unchanged mild left intracranial ICA stenosis. 5. Aortic Atherosclerosis (ICD10-I70.0). Electronically Signed   By: Logan Bores M.D.   On: 08/16/2019 20:54   CT ANGIO NECK W OR WO CONTRAST  Result Date: 08/16/2019 CLINICAL DATA:  Stroke follow-up. Subcentimeter left frontal infarct on MRI. EXAM: CT ANGIOGRAPHY HEAD AND NECK TECHNIQUE: Multidetector CT imaging of the head and neck was performed using the standard protocol during bolus administration of intravenous contrast. Multiplanar CT image reconstructions and MIPs were obtained to evaluate the vascular anatomy. Carotid stenosis measurements (when applicable) are obtained utilizing NASCET criteria, using the distal internal carotid diameter as the denominator. CONTRAST:  65mL OMNIPAQUE IOHEXOL 350 MG/ML SOLN COMPARISON:  Head MRI 08/16/2019.  Head and neck CTA 12/31/2011. FINDINGS: CT HEAD FINDINGS Brain: The punctate medial left frontal cortical infarct on MRI is not well demonstrated by CT. No acute large territory infarct, intracranial hemorrhage, mass, midline shift, or extra-axial fluid collection is identified. The ventricles and sulci are within normal limits for age. Vascular: Calcified atherosclerosis at the skull base. Skull: No fracture or suspicious osseous lesion. Sinuses: Minimal mucosal thickening in the paranasal sinuses. Clear mastoid air cells. Orbits: Bilateral cataract extraction. Review of the MIP images confirms the above findings CTA NECK FINDINGS Aortic arch: Standard 3 vessel aortic arch with mild atherosclerotic plaque. No significant arch vessel origin stenosis. Right carotid system: Patent with prominent, largely calcified plaque at the carotid bifurcation resulting in progressive high-grade stenosis of the distal common and proximal internal and external carotid arteries. Luminal visualization is limited by the  calcified plaque, however ICA origin stenosis is estimated at 75%. Left carotid system: Patent with predominantly calcified plaque at the carotid bifurcation resulting in progressive ICA origin stenosis estimated at 65-70%. Vertebral arteries: Patent and strongly dominant left vertebral artery with mild origin stenosis due to calcified plaque, unchanged. Diminutive right vertebral artery with chronic occlusion beginning at the V3 level. Skeleton: No acute osseous abnormality or suspicious osseous lesion. Other neck: No evidence of cervical lymphadenopathy or mass. Upper chest: Small pulmonary nodules including a 9 x 4 mm nodule in the left upper lobe, more fully evaluated on chest CT and PET-CT last month. Mild dependent atelectasis in both lungs. Review of the MIP images confirms the above findings CTA HEAD FINDINGS Anterior circulation: The internal carotid arteries are patent from skull base to carotid termini with asymmetric atherosclerotic plaque on the left resulting in mild left cavernous and proximal supraclinoid stenoses, similar to the prior CTA. ACAs  and MCAs are patent without evidence of a proximal branch occlusion. There are new moderate right and mild left M1 stenoses. The right A1 segment is dominant. An infundibulum is noted at the right posterior communicating origin. Posterior circulation: The intracranial left vertebral artery is widely patent and supplies the basilar. 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 The basilar artery is widely patent. Patent SCAs are seen bilaterally. Both PCAs are patent with mild irregularity but no significant proximal stenosis. No aneurysm is identified. Venous sinuses: Patent. Anatomic variants: None of significance. Review of the MIP images confirms the above findings IMPRESSION: 1. Progressive cervical carotid atherosclerosis with 75% right and 65-70% left proximal ICA stenoses. 2. Chronic distal occlusion of the non dominant right  vertebral artery. 3. New moderate right and mild left M1 stenoses. 4. Unchanged mild left intracranial ICA stenosis. 5. Aortic Atherosclerosis (ICD10-I70.0). Electronically Signed   By: Logan Bores M.D.   On: 08/16/2019 20:54   MR BRAIN WO CONTRAST  Result Date: 08/16/2019 CLINICAL DATA:  Ataxia.  Rule out stroke.  TAVR 08/15/2019 EXAM: MRI HEAD WITHOUT CONTRAST TECHNIQUE: Multiplanar, multiecho pulse sequences of the brain and surrounding structures were obtained without intravenous contrast. COMPARISON:  MRI head 01/18/2012 FINDINGS: Brain: Small focus of acute infarction in the left medial frontal lobe. This measures approximately 4 mm in diameter. No other acute infarct. Moderate atrophy. Negative for hydrocephalus. Normal white matter. Negative for hemorrhage or mass. Vascular: Normal arterial flow voids. Distal right vertebral artery appears hypoplastic unchanged from prior studies. Skull and upper cervical spine: No focal skeletal lesion. Sinuses/Orbits: Moderate mucosal edema paranasal cataract extraction sinuses. Bilateral Other: None IMPRESSION: 4 mm acute infarct left medial frontal cortex Generalized atrophy.  No significant chronic ischemic change. Electronically Signed   By: Franchot Gallo M.D.   On: 08/16/2019 14:57   VAS US CAROTID  Result Date: 08/18/2019 Carotid Arterial Duplex Study Indications:       CVA and CTA showed carotid stenosis progression, but will                    need to compare with carotid doppler. Risk Factors:      Hypertension, hyperlipidemia, Diabetes, prior CVA. Comparison Study:  06/01/19 previous Performing Technologist: Abram Sander RVS  Examination Guidelines: A complete evaluation includes B-mode imaging, spectral Doppler, color Doppler, and power Doppler as needed of all accessible portions of each vessel. Bilateral testing is considered an integral part of a complete examination. Limited examinations for reoccurring indications may be performed as noted.  Right  Carotid Findings: +----------+--------+--------+--------+------------------+--------+           PSV cm/sEDV cm/sStenosisPlaque DescriptionComments +----------+--------+--------+--------+------------------+--------+ CCA Prox  78      7               heterogenous               +----------+--------+--------+--------+------------------+--------+ CCA Distal53      7               heterogenous               +----------+--------+--------+--------+------------------+--------+ ICA Prox  305     45      40-59%  heterogenous               +----------+--------+--------+--------+------------------+--------+ ICA Mid   132     24                                         +----------+--------+--------+--------+------------------+--------+  ICA Distal77      17                                         +----------+--------+--------+--------+------------------+--------+ ECA       541                                                +----------+--------+--------+--------+------------------+--------+ +----------+--------+-------+--------+-------------------+           PSV cm/sEDV cmsDescribeArm Pressure (mmHG) +----------+--------+-------+--------+-------------------+ Subclavian109                                        +----------+--------+-------+--------+-------------------+ +---------+--------+--+--------+---------+ VertebralPSV cm/s35EDV cm/sAntegrade +---------+--------+--+--------+---------+  Left Carotid Findings: +----------+--------+--------+--------+------------------+--------+           PSV cm/sEDV cm/sStenosisPlaque DescriptionComments +----------+--------+--------+--------+------------------+--------+ CCA Prox  123                     heterogenous               +----------+--------+--------+--------+------------------+--------+ CCA Distal130                     heterogenous                +----------+--------+--------+--------+------------------+--------+ ICA Prox  137     35      1-39%   heterogenous               +----------+--------+--------+--------+------------------+--------+ ICA Distal97      17                                         +----------+--------+--------+--------+------------------+--------+ ECA       167                                                +----------+--------+--------+--------+------------------+--------+ +----------+--------+--------+--------+-------------------+           PSV cm/sEDV cm/sDescribeArm Pressure (mmHG) +----------+--------+--------+--------+-------------------+ Subclavian115                                         +----------+--------+--------+--------+-------------------+ +---------+--------+--+--------+--+---------+ VertebralPSV cm/s44EDV cm/s12Antegrade +---------+--------+--+--------+--+---------+   Summary: Right Carotid: 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 Left Carotid: Velocities in the left ICA are consistent with a 1-39% stenosis. Vertebrals: Bilateral vertebral arteries demonstrate antegrade flow. *See table(s) above for measurements and observations.  Electronically signed by Antony Contras MD on 08/18/2019 at 11:00:32 AM.    Final     Cardiac Studies    TAVR OPERATIVE NOTE   Date of Procedure:                08/15/2019  Preoperative Diagnosis:      Severe Aortic Stenosis   Postoperative Diagnosis:    Same   Procedure:  Transcatheter Aortic Valve Replacement - Percutaneous Right Transfemoral Approach             Edwards Sapien 3 Ultra THV (size 26 mm, model # 9750TFX, serial # [id number])              Co-Surgeons:                        Gaye Pollack, MD and Sherren Mocha, MD    Anesthesiologist:                  Annye Asa, MD  Echocardiographer:              Bertrum Sol, MD  Pre-operative Echo Findings: ? Severe  aortic stenosis ? Normal left ventricular systolic function  Post-operative Echo Findings: ? NO paravalvular leak ? Normal left ventricular systolic function   _________________   Echo 08/16/19:  IMPRESSIONS  1. Left ventricular ejection fraction, by estimation, is 55 to 60%. The  left ventricle has normal function. The left ventricle demonstrates  regional wall motion abnormalities (see scoring diagram/findings for  description). There is mild concentric left ventricular hypertrophy. Indeterminate diastolic filling due to E-A fusion.  2. Right ventricular systolic function is normal. The right ventricular  size is normal.  3. The mitral valve is normal in structure. No evidence of mitral valve  regurgitation. No evidence of mitral stenosis.  4. The aortic valve is normal in structure. Aortic valve regurgitation is  not visualized. No aortic stenosis is present. Aortic valve mean gradient  measures 11.7 mmHg. Aortic valve Vmax measures 2.48 m/s.  5. The inferior vena cava is normal in size with greater than 50%  respiratory variability, suggesting right atrial pressure of 3 mmHg.   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  No significant chronic ischemic change.   _______________  Ct angio head/neck 08/16/19 IMPRESSION: 1. Progressive cervical carotid atherosclerosis with 75% right and 65-70% left proximal ICA stenoses. 2. 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 3. New moderate right and mild left M1 stenoses. 4. Unchanged mild left intracranial ICA stenosis. 5. Aortic Atherosclerosis (ICD10-I70.0).   Patient Profile     Alexis Griffith is a 79 y.o. male with a history of HTN, HLD, DMT2, OSA, history of CVA, multivessel CAD, recently diagnosed renal cell carcinoma and severe AS who presented to [facility] on 08/15/19 for planned TAVR.  Assessment & Plan      Severe AS: s/p successful TAVR with a 26 mm Edwards Sapien 3 THV via the TF approach on 08/15/19. Post operative echo showed EF 55%, normally functioning TAVR with a mean gradient of 11.7 mm hg and no PVL. Groin sites are stable. ECG with sinus and 1st deg AV block (old) and no high grade heart block. Started on Asprin and Plavix (home Aggrenox discontinued). Plavix now discontinued with gross hematuria.  Acute on chronic diastolic CHF: as evidenced by an elevated BNP on pre admission lab work. This has been treated with TAVR. I resumed his home Hyzaar, but now this has been discontinued given orthostatic hypotension, symptomatic hypotension and AKI  AKI: creat up from 1 --> 1.74. Will treat with gentle fluids. Hyzaar discontinued.   Cerebrovascular disease:pt has had new mobility issues and syncope after TAVR. He also had an intractable headache. MR brain showed a4 mm acute infarct left medial frontalcortex, which was felt to  be incidental and procedure related. CTA of the head and neck show moderate 65 to 75% bilateral ICA stenoses but will require continued surveillance and outpatient follow-up.  Recurrent syncope/orthostatic hypotensio: felt to be possibly vasovagal in the setting of urinary incontinence, but continues to occur. Occurred this morning in the setting of orthostatic hypotension. Will get orthostatic vitals q shift. All antihypertensives held. Reluctant to stop doxazosin as he has acute urinary retention.   HTN: BP had been elevated and he was resumed on his home meds. He has been orthostatic and having syncope as well as AKI. I have discontinued Hyzaar and will treat with fluids.   DMT2: continue SSI  Acute urinary retention with hematuria: now with an18 French coud catheterplacement by urology on 6/16. Per urology, there isconcern for bladder stretch injury, socatheter should remain in place for several days while monitoring gross hematuria.Plavix discontinued. Plan is  for discharge with coude cath in. I have reached out to urology who will make sure he has an outpatient voiding trial in the near future.   RCC: pre TAVR CT showed a large RCC and pulmonary nodules. Follow up PET scan did not show evidence of metastatic disease. He has an apt with Dr. Tresa Moore with urology at the end of this month.  Pulmonary nodules: PET scan showed a dominant right lower lobe 1.2 cm solid pulmonary nodule is non-hypermetabolic. Additional small solid subcentimeter pulmonary nodules in both lungs are below PET resolution and warrant attention on follow-up chest CT in 3 months. This will be followed over time.  SignedAngelena Form, PA-C  08/18/2019, 11:27 AM  Pager [phone]  Patient seen, examined. Available data reviewed. Agree with findings, assessment, and plan as outlined by Nell Range, PA-C. On my exam, he is alert, oriented, in NAD. Lungs CTA, heart RRR 2/6 SEM at the RUSB, no diastolic murmur. abd soft, NT. Groin sites clear, no leg edema, foley with maroon urine. Continued issues with orthostatic hypotension. Echo and labs look ok with the exception of AKI. Will give 0.9% NS 100 cc/hr for one liter, hold antihypertensives, get up to chair as tolerated. Not ready for hospital DC as he remains somewhat tenuous.   Sherren Mocha, M.D. 08/18/2019 5:45 PM

## 2019-08-18 NOTE — Progress Notes (Signed)
Additional note after I discussed with Pt, he stated he had been diagnosed with OSA before, but he refused to use O2 or CPAP machine due to his preference, he disliked to use the old version of face mask with CPAP machine. Pt's stated he will be more open to a new model of CPAP if it is necessary. We will discuss with MD at morning round.  Last night his SPO2 on room air dropped to 82-88%.  O2 NCL 2 LPM given and he's able to maintain SPO2 > 96-98%. Auscultated lung clear bilaterally. Will continue to monitor.  Onpaneya Hiroki Wint. RN

## 2019-08-19 LAB — BASIC METABOLIC PANEL
Anion gap: 9 (ref 5–15)
BUN: 20 mg/dL (ref 8–23)
CO2: 28 mmol/L (ref 22–32)
Calcium: 9 mg/dL (ref 8.9–10.3)
Chloride: 99 mmol/L (ref 98–111)
Creatinine, Ser: 1.15 mg/dL (ref 0.61–1.24)
GFR calc Af Amer: >60 mL/min (ref >60)
GFR calc non Af Amer: >60 mL/min (ref >60)
Glucose, Bld: 148 mg/dL — ABNORMAL HIGH (ref 70–99)
Potassium: 3.5 mmol/L (ref 3.5–5.1)
Sodium: 136 mmol/L (ref 135–145)

## 2019-08-19 LAB — GLUCOSE, CAPILLARY
Glucose-Capillary: 142 mg/dL — ABNORMAL HIGH (ref 70–99)
Glucose-Capillary: 239 mg/dL — ABNORMAL HIGH (ref 70–99)
Glucose-Capillary: 174 mg/dL — ABNORMAL HIGH (ref 70–99)
Glucose-Capillary: 230 mg/dL — ABNORMAL HIGH (ref 70–99)

## 2019-08-19 LAB — CBC
HCT: 37.5 % — ABNORMAL LOW (ref 39.0–52.0)
Hemoglobin: 12.4 g/dL — ABNORMAL LOW (ref 13.0–17.0)
MCH: 27.6 pg (ref 26.0–34.0)
MCHC: 33.1 g/dL (ref 30.0–36.0)
MCV: 83.5 fL (ref 80.0–100.0)
Platelets: 124 10*3/uL — ABNORMAL LOW (ref 150–400)
RBC: 4.49 MIL/uL (ref 4.22–5.81)
RDW: 13.1 % (ref 11.5–15.5)
WBC: 6.6 10*3/uL (ref 4.0–10.5)
nRBC: 0 % (ref 0.0–0.2)

## 2019-08-19 MED ORDER — INSULIN GLARGINE 100 UNIT/ML ~~LOC~~ SOLN
14.0000 [IU] | Freq: Every day | SUBCUTANEOUS | Status: DC
  Administered 2019-08-19 – 2019-08-28 (×10): 14 [IU] via SUBCUTANEOUS
  Filled 2019-08-19 (×10): qty 0.14

## 2019-08-19 MED ORDER — METOPROLOL TARTRATE 12.5 MG HALF TABLET
12.5000 mg | ORAL_TABLET | Freq: Two times a day (BID) | ORAL | Status: DC
  Administered 2019-08-19 – 2019-08-22 (×7): 12.5 mg via ORAL
  Filled 2019-08-19 (×7): qty 1

## 2019-08-19 NOTE — Progress Notes (Signed)
Mobility Specialist: Progress Note    08/19/19 1623  Orthostatic Lying   BP- Lying 154/68  Pulse- Lying 83  Orthostatic Sitting  BP- Sitting 125/60  Pulse- Sitting 90  Orthostatic Standing at 0 minutes  BP- Standing at 0 minutes 107/46  Pulse- Standing at 0 minutes 97  Orthostatic Standing at 3 minutes  BP- Standing at 3 minutes 108/49  Pulse- Standing at 3 minutes 100  Mobility  Activity Ambulated in room  Level of Assistance Standby assist, set-up cues, supervision of patient - no hands on  Assistive Device Front wheel walker  Distance Ambulated (ft) 40 ft  Mobility Response Tolerated well  Mobility performed by Mobility specialist  $Mobility charge 1 Mobility   Post-Mobility: 93 HR, 94/47 BP, 97% SpO2  Pt tolerated ambulation well. We walked to the door and back. Pt's BP dropped to 94/47 at the end of ambulation. BP was retaken after a couple minutes while laying in bed and returned to 127/62.  [facility] Ovid Witman Mobility Specialist

## 2019-08-19 NOTE — Progress Notes (Signed)
Inpatient Diabetes Program Recommendations  AACE/ADA: New Consensus Statement on Inpatient Glycemic Control (2015)  Target Ranges:  Prepandial:   less than 140 mg/dL      Peak postprandial:   less than 180 mg/dL (1-2 hours)      Critically ill patients:  140 - 180 mg/dL   Lab Results  Component Value Date   GLUCAP 142 (H) 08/19/2019   HGBA1C 6.4 (H) 08/17/2019    Review of Glycemic Control Results for Alexis Griffith, Alexis Griffith (MRN [id number]) as of 08/19/2019 10:19  Ref. Range 08/18/2019 16:27 08/18/2019 21:15 08/19/2019 06:26  Glucose-Capillary Latest Ref Range: 70 - 99 mg/dL 248 (H) 197 (H) 142 (H)   Diabetes history: Type 2 DM Outpatient Diabetes medications: Lantus 65 units QD, Januvia 100 mg QD Current orders for Inpatient glycemic control: Novolog 0-24 units TID&HS  Inpatient Diabetes Program Recommendations:    Noted consult.   Consider Lantus 14 units qd.   Spoke with patient regarding outpatient diabetes management. Verified home medications. Denies having hypoglycemic episodes. Is followed by outpatient PCP. Reviewed patient's current A1c of 6.4%. Explained what a A1c is and what it measures. Also reviewed goal A1c with patient, importance of good glucose control @ home, and blood sugar goals. Reviewed patho of DM, current inpatient insulin needs, decrease in insulin needs post procedure, differences between long acting and short acting insulin, vascular changes and commorbidities. Patient has no further questions at this time, plans to follow up with PCP.  Thanks,  Bronson Curb, MSN, RNC-OB Diabetes Coordinator [phone] (8a-5p)

## 2019-08-19 NOTE — Progress Notes (Signed)
Progress Note  Patient Name: Alexis Griffith Date of Encounter: 08/19/2019  [facility] Cardiologist: Mertie Moores, MD   Subjective   Having a better day.  Noticed that his heart rate is increased and he is concerned about that.  He was taking metoprolol 50 mg 3 times daily at home.  He has not had any chest pain or shortness of breath.  Still weak when up, but not quite as orthostatic today.  He was able to be in a chair for a few hours this morning and then he walked to the door and back with physical therapy today.  Blood pressure dropped from 145/60 9-1 103/70 and then steadily came up with continued standing.  Inpatient Medications    Scheduled Meds: . aspirin  81 mg Oral Daily  . Chlorhexidine Gluconate Cloth  6 each Topical Daily  . clonazePAM  0.5 mg Oral QID  . doxazosin  4 mg Oral QHS  . insulin aspart  0-24 Units Subcutaneous TID AC & HS  . metoprolol tartrate  12.5 mg Oral BID  . rosuvastatin  5 mg Oral Daily  . sodium chloride flush  3 mL Intravenous Q12H   Continuous Infusions: . sodium chloride 20 mL/hr at 08/17/19 2131  . nitroGLYCERIN    . phenylephrine (NEO-SYNEPHRINE) Adult infusion     PRN Meds: sodium chloride, acetaminophen **OR** acetaminophen, bismuth subsalicylate, morphine injection, ondansetron (ZOFRAN) IV, oxyCODONE, sodium chloride flush, traMADol, zolpidem   Vital Signs    Vitals:   08/19/19 0926 08/19/19 0928 08/19/19 0930 08/19/19 1258  BP: (!) 150/71 (!) 140/58 (!) 113/57 127/64  Pulse:   100 90  Resp:    14  Temp:    98.2 F (36.8 C)  TempSrc:    Oral  SpO2:    96%  Weight:      Height:        Intake/Output Summary (Last 24 hours) at 08/19/2019 1321 Last data filed at 08/19/2019 0635 Gross per 24 hour  Intake 880 ml  Output 5050 ml  Net -4170 ml   Last 3 Weights 08/19/2019 08/18/2019 08/17/2019  Weight (lbs) 196 lb 10.4 oz 197 lb 8 oz 197 lb 8.5 oz  Weight (kg) 89.2 kg 89.585 kg 89.6 kg      Telemetry    Sinus rhythm/sinus  tachycardia - Personally Reviewed   Physical Exam  Alert, oriented male in no distress GEN: No acute distress.   Neck: No JVD Cardiac:  Tachycardic and regular with 2/6 systolic murmur at the apex.  Respiratory: Clear to auscultation bilaterally. GI: Soft, nontender, non-distended  MS: No edema; No deformity. Neuro:  Nonfocal  Psych: Normal affect   Labs    High Sensitivity Troponin:  No results for input(s): TROPONINIHS in the last 720 hours.    Chemistry Recent Labs  Lab 08/17/19 0302 08/18/19 0322 08/19/19 0327  NA 134* 133* 136  K 3.7 3.8 3.5  CL 99 98 99  CO2 24 27 28   GLUCOSE 153* 162* 148*  BUN 20 27* 20  CREATININE 1.35* 1.74* 1.15  CALCIUM 8.9 8.9 9.0  GFRNONAA 50* 37* >60  GFRAA 58* 43* >60  ANIONGAP 11 8 9      Hematology Recent Labs  Lab 08/17/19 1143 08/18/19 0322 08/19/19 0327  WBC 9.3 8.5 6.6  RBC 4.41 4.62 4.49  HGB 12.3* 12.9* 12.4*  HCT 37.2* 39.0 37.5*  MCV 84.4 84.4 83.5  MCH 27.9 27.9 27.6  MCHC 33.1 33.1 33.1  RDW 13.1 13.2  13.1  PLT 121* 113* 124*    BNPNo results for input(s): BNP, PROBNP in the last 168 hours.   DDimer No results for input(s): DDIMER in the last 168 hours.   Radiology    No results found.   Patient Profile     79 y.o. male with a history of HTN, HLD, DMT2, OSA, history of CVA, multivessel CAD, recently diagnosed renal cell carcinoma and severe AS who presented to [facility] on 08/15/19 for planned TAVR.  Assessment & Plan    1.  Severe aortic stenosis status post TAVR: Treated with a 26 mm transcatheter heart valve with normal function of the TAVR prosthesis on postop echo, mean gradient 12 mmHg and no paravalvular regurgitation.  Patient is treated with aspirin alone, previously on Aggrenox at home. 2.  Acute on chronic diastolic heart failure: Progressive symptoms were noted prior to surgery with elevated BNP.  Have to hold diuretics and home antihypertensive medications because of marked orthostatic  hypotension. 3.  Acute kidney injury: Improved with fluids and holding antihypertensive medicines.  Creatinine peak was 1.74 mg/dL 4.  Cerebrovascular disease: Patient noted to have moderately severe bilateral ICA stenosis requiring continued surveillance.  He had a 4 mm acute infarct in the left medial frontal cortex by MRI brain post-TAVR.  He never developed any focal neurologic symptoms and this is probably an incidental postprocedural finding.  Appreciate neurology evaluation. 5.  Orthostatic hypotension with recurrent syncope: Antihypertensive medicines now on hold.  Will reinstitute metoprolol at low dose because of tachycardia, likely beta-blocker withdrawal since he takes metoprolol 50 mg 3 times daily at home.  Continue to hold other antihypertensive medications.  I think we should continue doxazosin with his acute urinary retention. 6.  Hypertension: As above, medicines on hold with the exception of low-dose metoprolol and doxazosin. 7.  Type 2 diabetes: Blood glucoses reviewed and are elevated with a range of 142-248.  Will add Lantus insulin per recommendation of diabetes team.  Continue sliding scale insulin. 8.  Acute urinary retention with hematuria: Patient has a coud catheter in place by urology on June 16.  They recommend that catheter remain in place for several days while monitoring gross hematuria.  His urine appears to be clearing.  We'll ask the urology team to weigh in on Monday whether the catheter can be pulled or should remain in place. 9.  Renal cell carcinoma: Patient with urology follow-up after discharge.  Disposition: Overall the patient is making slow progress.  He has been up in the chair for a few hours this morning and he was able to walk to the door and back today.  He remains weak and will continue to work with him, hopefully getting him home in the next 48 hours.  For questions or updates, please contact [place] Please consult [email or web address] for contact info  under        Signed, Sherren Mocha, MD  08/19/2019, 1:21 PM

## 2019-08-19 NOTE — Progress Notes (Signed)
Mobility Specialist: Progress Note   08/19/19 1142  Mobility  Activity  (Cancel)  Mobility performed by Mobility specialist   Nurse said not to work w/ today b/c of orthostatic BP.   [facility] Alexis Griffith Mobility Specialist

## 2019-08-19 NOTE — Progress Notes (Signed)
Occupational Therapy Treatment Patient Details Name: Alexis Griffith MRN: [id number] DOB: May 16, 1940 Today's Date: 08/19/2019    History of present illness 79 y.o. male with a history of HTN, HLD, DMT2, OSA, history of CVA, multivessel CAD, recently diagnosed renal cell carcinoma and severe AS who presented to [facility] on 08/15/19 for TAVR. Course complicated by AMS, BP and HR drop, imaging reveals 4 mm acute infarct L medial frontal cortex.   OT comments  Pt. Seen for skilled OT treatment session.  Spoke with rn who states it is okay to work with pt.  rn joined for session for any needed assistance.  Orthostatic bps taken during session. (see below for details).  Pt. Motivated and eager to work with therapist asst. Bed mobility completed min a.  Transfer from eob to recliner min a with min cues for hand placement and sequencing.   SUPINE: 250/53-97 SITTING: 140/58-80 STANDING: 113/57-75 SEATED END OF SESSION: 143/69-88   Follow Up Recommendations  Home health OT;Supervision/Assistance - 24 hour    Equipment Recommendations  3 in 1 bedside commode    Recommendations for Other Services      Precautions / Restrictions Precautions Precautions: Fall Precaution Comments: Watch BPs, have been taking orthostatics Restrictions Weight Bearing Restrictions: No       Mobility Bed Mobility Overal bed mobility: Needs Assistance Bed Mobility: Sit to Supine       Sit to supine: Min assist   General bed mobility comments: cues for utilization of bed rail to guide body towards eob (R) side.  pt.able to bring trunk upright into sitting.  cues to scoot hips to eob.  multiple attempts but pt. able to complete.  Transfers Overall transfer level: Needs assistance Equipment used: Rolling walker (2 wheeled) Transfers: Sit to/from [facility] Sit to Stand: Min assist Stand pivot transfers: Min assist       General transfer comment: min assist for power up, steadying, hand  placement when rising.    Balance                                           ADL either performed or assessed with clinical judgement   ADL Overall ADL's : Needs assistance/impaired                         Toilet Transfer: Minimal assistance;RW Toilet Transfer Details (indicate cue type and reason): simulated in room from eob to recliner           General ADL Comments: pt. still presenting with orthostatic bp changes.  today greater wtih sit/stand. rn present and assisted with session for safety precautions.  allowed short distance to recliner only, thus simulated toileting task. pt. tolerated well     Vision       Perception     Praxis      Cognition Arousal/Alertness: Awake/alert Behavior During Therapy: WFL for tasks assessed/performed                                            Exercises     Shoulder Instructions       General Comments      Pertinent Vitals/ Pain       Pain Assessment: No/denies pain  Home Living  Prior Functioning/Environment              Frequency  Min 2X/week        Progress Toward Goals  OT Goals(current goals can now be found in the care plan section)  Progress towards OT goals: Progressing toward goals     Plan Discharge plan remains appropriate    Co-evaluation                 AM-PAC OT "6 Clicks" Daily Activity     Outcome Measure   Help from another person eating meals?: None Help from another person taking care of personal grooming?: A Little Help from another person toileting, which includes using toliet, bedpan, or urinal?: A Little Help from another person bathing (including washing, rinsing, drying)?: A Little Help from another person to put on and taking off regular upper body clothing?: A Little Help from another person to put on and taking off regular lower body clothing?: A Lot 6 Click  Score: 18    End of Session Equipment Utilized During Treatment: Gait belt  OT Visit Diagnosis: Unsteadiness on feet (R26.81);Other abnormalities of gait and mobility (R26.89);Other symptoms and signs involving cognitive function   Activity Tolerance Patient tolerated treatment well   Patient Left in chair;with call bell/phone within reach   Nurse Communication Other (comment) (spoke with rn, states ok to work with pt. but states needs 2 people present. she assisted with session bps taken throughout)        Time: 1941-7408 OT Time Calculation (min): 13 min  Charges: OT General Charges $OT Visit: 1 Visit OT Treatments $Self Care/Home Management : 8-22 mins  Sonia Baller, COTA/L Acute Rehabilitation [phone]   Janice Coffin 08/19/2019, 10:47 AM

## 2019-08-19 NOTE — Progress Notes (Signed)
Physical Therapy Treatment Patient Details Name: Alexis Griffith MRN: [id number] DOB: [birth year] Today's Date: 08/19/2019    History of Present Illness 79 y.o. male with a history of HTN, HLD, DMT2, OSA, history of CVA, multivessel CAD, recently diagnosed renal cell carcinoma and severe AS who presented to [facility] on 08/15/19 for TAVR. Course complicated by AMS, BP and HR drop, imaging reveals 4 mm acute infarct L medial frontal cortex.    PT Comments    Pt sitting in chair on arrival and voicing concern over lack of BM for 4 days and fear of syncope. Pt with continued drop in BP with transition from sitting to standing but did rise after 3 min and pt able to tolerate ambulation in room this session. Pt educated for continued need to monitor BP and for assistance with mobility but encouraged to get to the bathroom with nursing staff. Will continue to follow.   145/69 (93) sitting Standing 103/70(76), HR 135 3 min standing 119/59 (75), HR 128  bP sitting post gait 127/64 (83) 97% on RA   Follow Up Recommendations  Home health PT;Supervision/Assistance - 24 hour     Equipment Recommendations  None recommended by PT    Recommendations for Other Services       Precautions / Restrictions Precautions Precautions: Fall Precaution Comments: Watch BPs    Mobility  Bed Mobility Overal bed mobility: Modified Independent Bed Mobility: Sit to Supine       Sit to supine: Min assist   General bed mobility comments: pt able to transition from sitting to supine with supervision only for lines  Transfers Overall transfer level: Needs assistance Equipment used: Rolling walker (2 wheeled) Transfers: Sit to/from Stand Sit to Stand: Min guard Stand pivot transfers: Min assist       General transfer comment: guarding for safety with cues for hand placement  Ambulation/Gait Ambulation/Gait assistance: Min guard Gait Distance (Feet): 40 Feet Assistive device: Rolling walker (2  wheeled) Gait Pattern/deviations: Step-through pattern;Decreased stride length   Gait velocity interpretation: 1.31 - 2.62 ft/sec, indicative of limited community ambulator General Gait Details: pt with good stability with use of RW for safety this session without drop in BP during gait and pt able to remain conversant throughout gait. Declined further ambulation for safety   Stairs             Wheelchair Mobility    Modified Rankin (Stroke Patients Only) Modified Rankin (Stroke Patients Only) Pre-Morbid Rankin Score: No significant disability Modified Rankin: Moderately severe disability     Balance Overall balance assessment: No apparent balance deficits (not formally assessed)                                          Cognition Arousal/Alertness: Awake/alert Behavior During Therapy: WFL for tasks assessed/performed Overall Cognitive Status: Within Functional Limits for tasks assessed                                        Exercises General Exercises - Lower Extremity Long Arc Quad: AROM;Both;Seated;20 reps Hip Flexion/Marching: AROM;Both;Seated;20 reps    General Comments        Pertinent Vitals/Pain Pain Assessment: No/denies pain    Home Living  Prior Function            PT Goals (current goals can now be found in the care plan section) Progress towards PT goals: Progressing toward goals    Frequency    Min 3X/week      PT Plan Current plan remains appropriate    Co-evaluation              AM-PAC PT "6 Clicks" Mobility   Outcome Measure  Help needed turning from your back to your side while in a flat bed without using bedrails?: None Help needed moving from lying on your back to sitting on the side of a flat bed without using bedrails?: None Help needed moving to and from a bed to a chair (including a wheelchair)?: A Little Help needed standing up from a chair using your  arms (e.g., wheelchair or bedside chair)?: A Little Help needed to walk in hospital room?: A Little Help needed climbing 3-5 steps with a railing? : A Little 6 Click Score: 20    End of Session Equipment Utilized During Treatment: Gait belt Activity Tolerance: Patient tolerated treatment well Patient left: in bed;with call bell/phone within reach (bed alarm would not set) Nurse Communication: Mobility status PT Visit Diagnosis: Other abnormalities of gait and mobility (R26.89);Muscle weakness (generalized) (M62.81);Other symptoms and signs involving the nervous system (R29.898)     Time: 3903-0092 PT Time Calculation (min) (ACUTE ONLY): 23 min  Charges:  $Gait Training: 8-22 mins $Therapeutic Exercise: 8-22 mins                     Marty Sadlowski P, PT Acute Rehabilitation Services Pager: [phone] Office: [place] 08/19/2019, 1:03 PM

## 2019-08-20 DIAGNOSIS — I6523 Occlusion and stenosis of bilateral carotid arteries: Secondary | ICD-10-CM

## 2019-08-20 DIAGNOSIS — I25119 Atherosclerotic heart disease of native coronary artery with unspecified angina pectoris: Secondary | ICD-10-CM

## 2019-08-20 DIAGNOSIS — E7800 Pure hypercholesterolemia, unspecified: Secondary | ICD-10-CM

## 2019-08-20 DIAGNOSIS — I951 Orthostatic hypotension: Secondary | ICD-10-CM

## 2019-08-20 LAB — BASIC METABOLIC PANEL
Anion gap: 11 (ref 5–15)
BUN: 26 mg/dL — ABNORMAL HIGH (ref 8–23)
CO2: 27 mmol/L (ref 22–32)
Calcium: 9.2 mg/dL (ref 8.9–10.3)
Chloride: 96 mmol/L — ABNORMAL LOW (ref 98–111)
Creatinine, Ser: 1.49 mg/dL — ABNORMAL HIGH (ref 0.61–1.24)
GFR calc Af Amer: 51 mL/min — ABNORMAL LOW (ref >60)
GFR calc non Af Amer: 44 mL/min — ABNORMAL LOW (ref >60)
Glucose, Bld: 142 mg/dL — ABNORMAL HIGH (ref 70–99)
Potassium: 3.6 mmol/L (ref 3.5–5.1)
Sodium: 134 mmol/L — ABNORMAL LOW (ref 135–145)

## 2019-08-20 LAB — CBC
HCT: 38.2 % — ABNORMAL LOW (ref 39.0–52.0)
Hemoglobin: 12.7 g/dL — ABNORMAL LOW (ref 13.0–17.0)
MCH: 27.8 pg (ref 26.0–34.0)
MCHC: 33.2 g/dL (ref 30.0–36.0)
MCV: 83.6 fL (ref 80.0–100.0)
Platelets: 145 10*3/uL — ABNORMAL LOW (ref 150–400)
RBC: 4.57 MIL/uL (ref 4.22–5.81)
RDW: 13 % (ref 11.5–15.5)
WBC: 7.7 10*3/uL (ref 4.0–10.5)
nRBC: 0 % (ref 0.0–0.2)

## 2019-08-20 LAB — GLUCOSE, CAPILLARY
Glucose-Capillary: 154 mg/dL — ABNORMAL HIGH (ref 70–99)
Glucose-Capillary: 207 mg/dL — ABNORMAL HIGH (ref 70–99)
Glucose-Capillary: 223 mg/dL — ABNORMAL HIGH (ref 70–99)
Glucose-Capillary: 212 mg/dL — ABNORMAL HIGH (ref 70–99)

## 2019-08-20 MED ORDER — POLYETHYLENE GLYCOL 3350 17 G PO PACK
17.0000 g | PACK | Freq: Every day | ORAL | Status: DC
  Administered 2019-08-20 – 2019-08-31 (×7): 17 g via ORAL
  Filled 2019-08-20 (×10): qty 1

## 2019-08-20 NOTE — Progress Notes (Signed)
Mobility Specialist: Progress Note    08/20/19 1351  Orthostatic Lying   BP- Lying 150/57  Pulse- Lying 98  Orthostatic Sitting  BP- Sitting 117/53  Pulse- Sitting 109  Orthostatic Standing at 0 minutes  BP- Standing at 0 minutes 109/49  Pulse- Standing at 0 minutes 122  Orthostatic Standing at 3 minutes  BP- Standing at 3 minutes 109/50  Pulse- Standing at 3 minutes 124  Mobility  Activity Ambulated in room  Level of Assistance Minimal assist, patient does 75% or more  Assistive Device Front wheel walker  Distance Ambulated (ft) 40 ft  Mobility Response Tolerated fair  Mobility performed by Mobility specialist  $Mobility charge 1 Mobility   Pre-Mobility: 98 HR, 150/57 BP During Mobility: 124 HR, 100% SpO2 Post-Mobility: 118 HR, 128/55 BP, 97% SpO2  Pt was asx during session.   [facility] Alexis Griffith Mobility Specialist

## 2019-08-20 NOTE — Progress Notes (Signed)
Progress Note  Patient Name: Alexis Griffith Date of Encounter: 08/20/2019  [facility] Cardiologist: Mertie Moores, MD   Subjective   Denies any chest pain or SOB this am.  Remains orthostatic with BP dropping from 130/61mmHg lying to 95/27mmHg standing and HR increases from 85>119bpm.   Inpatient Medications    Scheduled Meds: . aspirin  81 mg Oral Daily  . Chlorhexidine Gluconate Cloth  6 each Topical Daily  . clonazePAM  0.5 mg Oral QID  . doxazosin  4 mg Oral QHS  . insulin aspart  0-24 Units Subcutaneous TID AC & HS  . insulin glargine  14 Units Subcutaneous Daily  . metoprolol tartrate  12.5 mg Oral BID  . rosuvastatin  5 mg Oral Daily  . sodium chloride flush  3 mL Intravenous Q12H   Continuous Infusions: . sodium chloride 20 mL/hr at 08/17/19 2131  . nitroGLYCERIN    . phenylephrine (NEO-SYNEPHRINE) Adult infusion     PRN Meds: sodium chloride, acetaminophen **OR** acetaminophen, bismuth subsalicylate, morphine injection, ondansetron (ZOFRAN) IV, oxyCODONE, sodium chloride flush, traMADol, zolpidem   Vital Signs    Vitals:   08/20/19 0000 08/20/19 0343 08/20/19 0737 08/20/19 0924  BP: (!) 101/41 140/61 (!) 176/63 125/66  Pulse: 68 85 96 (!) 107  Resp: 17 18 18  (!) 23  Temp: 98 F (36.7 C) 98.2 F (36.8 C) 98.2 F (36.8 C)   TempSrc: Oral Oral Oral   SpO2: 95% 94% 98% 96%  Weight:  86.9 kg    Height:        Intake/Output Summary (Last 24 hours) at 08/20/2019 1157 Last data filed at 08/20/2019 0935 Gross per 24 hour  Intake 390 ml  Output 3750 ml  Net -3360 ml   Last 3 Weights 08/20/2019 08/19/2019 08/18/2019  Weight (lbs) 191 lb 9.6 oz 196 lb 10.4 oz 197 lb 8 oz  Weight (kg) 86.909 kg 89.2 kg 89.585 kg      Telemetry    NSR to sinus tachycardia - Personally Reviewed   Physical Exam  GEN: Well nourished, well developed in no acute distress HEENT: Normal NECK: No JVD; No carotid bruits LYMPHATICS: No lymphadenopathy CARDIAC:RRR, no  rubs,  gallops.  2/6 SM at the apex RESPIRATORY:  Clear to auscultation without rales, wheezing or rhonchi  ABDOMEN: Soft, non-tender, non-distended MUSCULOSKELETAL:  No edema; No deformity  SKIN: Warm and dry NEUROLOGIC:  Alert and oriented x 3 PSYCHIATRIC:  Normal affect    Labs    High Sensitivity Troponin:  No results for input(s): TROPONINIHS in the last 720 hours.    Chemistry Recent Labs  Lab 08/18/19 0322 08/19/19 0327 08/20/19 0326  NA 133* 136 134*  K 3.8 3.5 3.6  CL 98 99 96*  CO2 27 28 27   GLUCOSE 162* 148* 142*  BUN 27* 20 26*  CREATININE 1.74* 1.15 1.49*  CALCIUM 8.9 9.0 9.2  GFRNONAA 37* >60 44*  GFRAA 43* >60 51*  ANIONGAP 8 9 11      Hematology Recent Labs  Lab 08/18/19 0322 08/19/19 0327 08/20/19 0326  WBC 8.5 6.6 7.7  RBC 4.62 4.49 4.57  HGB 12.9* 12.4* 12.7*  HCT 39.0 37.5* 38.2*  MCV 84.4 83.5 83.6  MCH 27.9 27.6 27.8  MCHC 33.1 33.1 33.2  RDW 13.2 13.1 13.0  PLT 113* 124* 145*    BNPNo results for input(s): BNP, PROBNP in the last 168 hours.   DDimer No results for input(s): DDIMER in the last 168 hours.  Radiology    No results found.   Patient Profile     79 y.o. male with a history of HTN, HLD, DMT2, OSA, history of CVA, multivessel CAD, recently diagnosed renal cell carcinoma and severe AS who presented to [facility] on 08/15/19 for planned TAVR.  Assessment & Plan    1.  Severe aortic stenosis status post TAVR:  -Treated with a 26 mm transcatheter heart valve with normal function of the TAVR prosthesis on postop echo, mean gradient 12 mmHg and no paravalvular regurgitation.   -Patient is treated with aspirin alone, previously on Aggrenox at home.  2.  Acute on chronic diastolic heart failure:  -Progressive symptoms were noted prior to surgery with elevated BNP.  - have been holding diuretics and home antihypertensive medications because of marked orthostatic hypotension.  3.  Acute kidney injury:  -Improved with fluids and holding  antihypertensive medicines.   -Creatinine peak was 1.74 mg/dL and now down to 1.49  4.  Cerebrovascular disease:  -Patient noted to have moderately severe bilateral ICA stenosis requiring continued surveillance.   -He had a 4 mm acute infarct in the left medial frontal cortex by MRI brain post-TAVR.   -He never developed any focal neurologic symptoms and this is probably an incidental postprocedural finding.   -Appreciate neurology evaluation.  5.  Orthostatic hypotension with recurrent syncope:  -Antihypertensive medicines now on hold.   -Will reinstitute metoprolol at low dose because of tachycardia, likely beta-blocker withdrawal since he takes metoprolol 50 mg 3 times daily at home.   -Continue to hold other antihypertensive medications.   -Doxazosin continued with his acute urinary retention. -would avoid Florinef with hx of CHF -will place TED compression hose and abdominal binder to help with orthostasis -with hx of HTN, midodrine may not be a good option -could consider addition of Mestinon if orthostasis continues with compression hose and abdominal binder  6.  Hypertension:  -As above, medicines on hold with the exception of low-dose metoprolol and doxazosin.  7.  Type 2 diabetes:  -Blood glucoses reviewed and are elevated with a range of 142-248.   -Continue  Lantus insulin per recommendation of diabetes team.   -continue sliding scale insulin.  8.  Acute urinary retention with hematuria:  -Patient has a coud catheter in place by urology on June 16.   -They recommend that catheter remain in place for several days while monitoring gross hematuria.  -His urine appears to be clearing.   -We'll ask the urology team to weigh in on Monday whether the catheter can be pulled or should remain in place.  9.  Renal cell carcinoma:  -Patient with urology follow-up after discharge.  I have spent a total of 35 minutes with patient reviewing hospital notes , telemetry, EKGs, labs and  examining patient as well as establishing an assessment and plan that was discussed with the patient.  > 50% of time was spent in direct patient care.    For questions or updates, please contact [place] Please consult [email or web address] for contact info under        Signed, Fransico Him, MD  08/20/2019, 11:57 AM

## 2019-08-20 NOTE — Progress Notes (Signed)
Mobility Specialist: Progress Note    08/20/19 1140  Mobility  Activity Stood at bedside  Level of Assistance Minimal assist, patient does 75% or more  Assistive Device Front wheel walker  Minutes Stood 4 minutes  Mobility Response Tolerated fair  Mobility performed by Mobility specialist  $Mobility charge 1 Mobility   Pre-Mobility: 85 HR, 130/94 BP, 95% SpO2 During Mobility: 121 HR Post-Mobility: 99 HR, 120/66 BP, 97% SpO2  Pt was asx when BP dropped after standing.   [facility] Sallie Maker Mobility Specialist

## 2019-08-21 ENCOUNTER — Other Ambulatory Visit: Payer: Self-pay

## 2019-08-21 LAB — BASIC METABOLIC PANEL WITH GFR
Anion gap: 9 (ref 5–15)
BUN: 26 mg/dL — ABNORMAL HIGH (ref 8–23)
CO2: 29 mmol/L (ref 22–32)
Calcium: 9.5 mg/dL (ref 8.9–10.3)
Chloride: 97 mmol/L — ABNORMAL LOW (ref 98–111)
Creatinine, Ser: 1.24 mg/dL (ref 0.61–1.24)
GFR calc Af Amer: >60 mL/min
GFR calc non Af Amer: 55 mL/min — ABNORMAL LOW
Glucose, Bld: 161 mg/dL — ABNORMAL HIGH (ref 70–99)
Potassium: 4.1 mmol/L (ref 3.5–5.1)
Sodium: 135 mmol/L (ref 135–145)

## 2019-08-21 LAB — CBC
HCT: 39.2 % (ref 39.0–52.0)
Hemoglobin: 12.9 g/dL — ABNORMAL LOW (ref 13.0–17.0)
MCH: 27.6 pg (ref 26.0–34.0)
MCHC: 32.9 g/dL (ref 30.0–36.0)
MCV: 83.8 fL (ref 80.0–100.0)
Platelets: 151 10*3/uL (ref 150–400)
RBC: 4.68 MIL/uL (ref 4.22–5.81)
RDW: 12.8 % (ref 11.5–15.5)
WBC: 6.4 10*3/uL (ref 4.0–10.5)
nRBC: 0 % (ref 0.0–0.2)

## 2019-08-21 LAB — GLUCOSE, CAPILLARY
Glucose-Capillary: 143 mg/dL — ABNORMAL HIGH (ref 70–99)
Glucose-Capillary: 200 mg/dL — ABNORMAL HIGH (ref 70–99)
Glucose-Capillary: 233 mg/dL — ABNORMAL HIGH (ref 70–99)
Glucose-Capillary: 248 mg/dL — ABNORMAL HIGH (ref 70–99)
Glucose-Capillary: 180 mg/dL — ABNORMAL HIGH (ref 70–99)

## 2019-08-21 NOTE — Progress Notes (Signed)
Abdominal binder placed on pt. °

## 2019-08-21 NOTE — Progress Notes (Signed)
Inpatient Diabetes Program Recommendations  AACE/ADA: New Consensus Statement on Inpatient Glycemic Control (2015)  Target Ranges:  Prepandial:   less than 140 mg/dL      Peak postprandial:   less than 180 mg/dL (1-2 hours)      Critically ill patients:  140 - 180 mg/dL   Lab Results  Component Value Date   GLUCAP 233 (H) 08/21/2019   HGBA1C 6.4 (H) 08/17/2019    Review of Glycemic Control Results for Alexis Griffith, Alexis Griffith (MRN [id number]) as of 08/21/2019 13:33  Ref. Range 08/20/2019 06:20 08/20/2019 12:51 08/20/2019 16:39 08/20/2019 22:09  Glucose-Capillary Latest Ref Range: 70 - 99 mg/dL 154 (H) 207 (H) 223 (H) 212 (H)  Results for Alexis Griffith, Alexis Griffith (MRN [id number]) as of 08/21/2019 13:33  Ref. Range 08/21/2019 06:35 08/21/2019 10:16 08/21/2019 12:42  Glucose-Capillary Latest Ref Range: 70 - 99 mg/dL 143 (H) 200 (H) 233 (H)   Diabetes history:  DM2  Outpatient Diabetes medications:  Lantus 65 units daily  Januvia 100 mg daily   Current orders for Inpatient glycemic control:  Lantus 14 units daily  Novolog 0-24 units tid & hs  Inpatient Diabetes Program Recommendations:     Post prandials elevated.   Novolog 2 units tid with meals  Will continue to follow while inpatient.  Thank you, Reche Dixon, RN, BSN Diabetes Coordinator Inpatient Diabetes Program [phone] (team pager from 8a-5p)

## 2019-08-21 NOTE — Progress Notes (Addendum)
[facility] VALVE TEAM  Patient Name: Alexis Griffith Date of Encounter: 08/21/2019  Primary Cardiologist: Dr. Acie Fredrickson / Dr. Burt Knack & Dr. Cyndia Bent (TAVR)  Hospital Problem List     Principal Problem:   S/P TAVR (transcatheter aortic valve replacement) Active Problems:   PVC (premature ventricular contraction)   Hypertension   Hyperlipidemia   Carotid artery disease (HCC)   TIA (transient ischemic attack)   Coronary artery disease involving native coronary artery of native heart with angina pectoris ([place])   Acute on chronic diastolic heart failure ([place])   Diabetes mellitus without complication (HCC)   Severe aortic stenosis   Sleep apnea   Renal mass   Subjective   Pt sitting up in chair wearing abdominal binder and compression stockings. No complaints. Not ready to go home. Wants to get catheter out.   Inpatient Medications    Scheduled Meds: . aspirin  81 mg Oral Daily  . Chlorhexidine Gluconate Cloth  6 each Topical Daily  . clonazePAM  0.5 mg Oral QID  . doxazosin  4 mg Oral QHS  . insulin aspart  0-24 Units Subcutaneous TID AC & HS  . insulin glargine  14 Units Subcutaneous Daily  . metoprolol tartrate  12.5 mg Oral BID  . polyethylene glycol  17 g Oral Daily  . rosuvastatin  5 mg Oral Daily  . sodium chloride flush  3 mL Intravenous Q12H   Continuous Infusions: . sodium chloride 20 mL/hr at 08/17/19 2131   PRN Meds: sodium chloride, acetaminophen **OR** acetaminophen, bismuth subsalicylate, morphine injection, ondansetron (ZOFRAN) IV, oxyCODONE, sodium chloride flush, traMADol, zolpidem   Vital Signs    Vitals:   08/20/19 1643 08/20/19 2335 08/21/19 0529 08/21/19 0729  BP: (!) 144/60 (!) 151/63 (!) 143/55 (!) 141/56  Pulse: 80 83 81 87  Resp: 17 16 19 19   Temp: 98.4 F (36.9 C) 99.7 F (37.6 C) 98.2 F (36.8 C) 98.1 F (36.7 C)  TempSrc: Oral Oral Oral Oral  SpO2: 97% 96% 96% 94%  Weight:   87.5 kg   Height:         Intake/Output Summary (Last 24 hours) at 08/21/2019 1123 Last data filed at 08/21/2019 0731 Gross per 24 hour  Intake 1160 ml  Output 3800 ml  Net -2640 ml   Filed Weights   08/19/19 0600 08/20/19 0343 08/21/19 0529  Weight: 89.2 kg 86.9 kg 87.5 kg    Physical Exam   GEN: Well nourished, well developed, in no acute distress. Up in chair, more alert today HEENT: Grossly normal.  Neck: Supple, no JVD, carotid bruits, or masses. Cardiac: RRR, soft flow murmur. No rubs, or gallops. No clubbing, cyanosis, edema.  Wearing compression stockings Respiratory:  Respirations regular and unlabored, clear to auscultation bilaterally. GI: Soft, nontender, nondistended, BS + x 4. MS: no deformity or atrophy. Skin: warm and dry, no rash.  Groin sites clear without hematoma or ecchymosis.  Neuro:  Strength and sensation are intact. Psych: AAOx3.  Normal affect.  Labs    CBC Recent Labs    08/20/19 0326 08/21/19 0612  WBC 7.7 6.4  HGB 12.7* 12.9*  HCT 38.2* 39.2  MCV 83.6 83.8  PLT 145* 160   Basic Metabolic Panel Recent Labs    08/20/19 0326 08/21/19 0612  NA 134* 135  K 3.6 4.1  CL 96* 97*  CO2 27 29  GLUCOSE 142* 161*  BUN 26* 26*  CREATININE 1.49* 1.24  CALCIUM 9.2  9.5   Liver Function Tests No results for input(s): AST, ALT, ALKPHOS, BILITOT, PROT, ALBUMIN in the last 72 hours. No results for input(s): LIPASE, AMYLASE in the last 72 hours. Cardiac Enzymes No results for input(s): CKTOTAL, CKMB, CKMBINDEX, TROPONINI in the last 72 hours. BNP Invalid input(s): POCBNP D-Dimer No results for input(s): DDIMER in the last 72 hours. Hemoglobin A1C No results for input(s): HGBA1C in the last 72 hours. Fasting Lipid Panel No results for input(s): CHOL, HDL, LDLCALC, TRIG, CHOLHDL, LDLDIRECT in the last 72 hours. Thyroid Function Tests No results for input(s): TSH, T4TOTAL, T3FREE, THYROIDAB in the last 72 hours.  Invalid input(s): FREET3  Telemetry    Sinus with  some sinus tachy, PVCs- Personally Reviewed  ECG    Sinus with 1st deg AV block (PR 231ms) from 6/15 - Personally Reviewed  Radiology    No results found.  Cardiac Studies    TAVR OPERATIVE NOTE   Date of Procedure:                08/15/2019  Preoperative Diagnosis:      Severe Aortic Stenosis   Postoperative Diagnosis:    Same   Procedure:        Transcatheter Aortic Valve Replacement - Percutaneous Right Transfemoral Approach             Edwards Sapien 3 Ultra THV (size 26 mm, model # [id number], serial # [id number])              Co-Surgeons:                        Gaye Pollack, MD and Sherren Mocha, MD    Anesthesiologist:                  Annye Asa, MD  Echocardiographer:              Bertrum Sol, MD  Pre-operative Echo Findings: ? Severe aortic stenosis ? Normal left ventricular systolic function  Post-operative Echo Findings: ? NO paravalvular leak ? Normal left ventricular systolic function   _________________   Echo 08/16/19:  IMPRESSIONS  1. Left ventricular ejection fraction, by estimation, is 55 to 60%. The  left ventricle has normal function. The left ventricle demonstrates  regional wall motion abnormalities (see scoring diagram/findings for  description). There is mild concentric left ventricular hypertrophy. Indeterminate diastolic filling due to E-A fusion.  2. Right ventricular systolic function is normal. The right ventricular  size is normal.  3. The mitral valve is normal in structure. No evidence of mitral valve  regurgitation. No evidence of mitral stenosis.  4. The aortic valve is normal in structure. Aortic valve regurgitation is  not visualized. No aortic stenosis is present. Aortic valve mean gradient  measures 11.7 mmHg. Aortic valve Vmax measures 2.48 m/s.  5. The inferior vena cava is normal in size with greater than 50%  respiratory variability, suggesting right atrial pressure of 3 mmHg.    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  No significant chronic ischemic change.   _______________  Ct angio head/neck 08/16/19 IMPRESSION: 1. Progressive cervical carotid atherosclerosis with 75% right and 65-70% left proximal ICA stenoses. 2. 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 3. New moderate right and mild left M1 stenoses. 4. Unchanged mild left intracranial ICA stenosis. 5. Aortic Atherosclerosis (ICD10-I70.0).   Patient Profile  Alexis Griffith is a 79 y.o. male with a history of HTN, HLD, DMT2, OSA, history of CVA, multivessel CAD, recently diagnosed renal cell carcinoma and severe AS who presented to [facility] on 08/15/19 for planned TAVR.  Assessment & Plan    Severe AS: s/p successful TAVR with a 26 mm Edwards Sapien 3 THV via the TF approach on 08/15/19. Post operative echo showed EF 55%, normally functioning TAVR with a mean gradient of 11.7 mm hg and no PVL. Groin sites are stable. ECG with sinus and 1st deg AV block (old) and no high grade heart block. Started on Asprin and Plavix (home Aggrenox discontinued). Plavix now discontinued with gross hematuria. Prolonged admission due to issues with acute urinary retention, hematuria and orthostatic hypotension with recurrent syncope.   Acute on chronic diastolic CHF: as evidenced by an elevated BNP on pre admission lab work. This has been treated with TAVR. Home diuretics on hold with orthostatic hypotension  AKI: creat peaked at 1.74. Now down to 1.24.  Cerebrovascular disease:MR brain showed a4 mm acute infarct left medial frontalcortex, which was felt to be incidental and procedure related. CTA of the head and neck show moderate 65 to 75% bilateral ICA stenoses but will require continued surveillance and outpatient follow-up.  Orthostatic hypotension with recurrent syncope: all BP meds were held.  Now back on Metoprolol 12.5mg  BID (home 25mg  TID) with rebound tachycardia. Wearing an abdominal binder and compression stockings. Continue doxazosin (old medication) as he has BPH with acute urinary retention. Continue to mobilize today  HTN: allowing for permissive supine hypertension given significant ortho stasis.   DMT2: continue SSI  Acute urinary retention with hematuria: now with an18 French coud catheterplacement by urology on 6/16. Plavix discontinue and gross hematuria resolved. Pt would like cath out. Discussed case with on call urologist today, Dr. Jeffie Pollock. Plan to have nursing pull out Coude cath at 4am tomorrow morning. If he cannot urinate on his own, the catheter will need to be replaced.   RCC: pre TAVR CT showed a large RCC and pulmonary nodules. Follow up PET scan did not show evidence of metastatic disease. He has an apt with Dr. Tresa Moore with urology at the end of this month.  Pulmonary nodules: PET scan showed a dominant right lower lobe 1.2 cm solid pulmonary nodule is non-hypermetabolic. Additional small solid subcentimeter pulmonary nodules in both lungs are below PET resolution and warrant attention on follow-up chest CT in 3 months. This will be followed over time.  SignedAngelena Form, PA-C  08/21/2019, 11:23 AM  Pager [phone]  Patient seen, examined. Available data reviewed. Agree with findings, assessment, and plan as outlined by Nell Range, PA-C. Pt doing much better today. Walked with PT and reports improved endurance. BM this afternoon. No dizziness. Tel shows sinus rhythm. Anticipate DC tomorrow.  Sherren Mocha, M.D. 08/21/2019 10:46 PM

## 2019-08-21 NOTE — Progress Notes (Signed)
Mobility Specialist - Progress Note   08/21/19 1414  Mobility  Activity Ambulated in hall;Ambulated to bathroom  Level of Assistance Modified independent, requires aide device or extra time  Assistive Device Front wheel walker  Distance Ambulated (ft) 270 ft  Mobility Response Tolerated well  Mobility performed by Mobility specialist  $Mobility charge 1 Mobility    Pre-mobility, sitting: 86 HR, 157/62 BP, 97% SpO2 Standing BP: 145/67 Standing 3 min BP: 135/52 During mobility: 102 HR, 132/57 BP, 98% SpO2 Post-mobility: 95 HR, 143/50 BP, 97% SPO2  Pt was sitting in chair when I went in the room. Pt had a BM after ambulation.   Pricilla Handler Mobility Specialist Mobility Specialist Phone: [phone]

## 2019-08-21 NOTE — Progress Notes (Signed)
CARDIAC REHAB PHASE I   PRE:  Rate/Rhythm: 96 SR ?1HB  BP:  Supine: 157/55  Sitting: 152/76  Standing: 89/57 1 min    91/54  2min   SaO2: 96%RA  MODE:  Ambulation: 92  ft Sat pt at 46 ft and checked BP at 111/51  POST:  Rate/Rhythm: 129 ST PVCs  BP:  Supine:   Sitting: 105/64  Standing:    SaO2: 95%RA 1027-2536 Orthostatics as above. Had pt stand 87mins prior to walking. He denied dizziness. Just tired. Walked 92 ft with gait belt use, rolling walker and asst x 2. Followed with rollator and had pt sit at 46 ft so we could check BP. Pt still denied dizziness. To recliner after walking back to room with chair alarm on. Call bell in reach.   Graylon Good, RN BSN  08/21/2019 8:44 AM

## 2019-08-22 DIAGNOSIS — I10 Essential (primary) hypertension: Secondary | ICD-10-CM

## 2019-08-22 LAB — CBC
HCT: 36.6 % — ABNORMAL LOW (ref 39.0–52.0)
Hemoglobin: 12 g/dL — ABNORMAL LOW (ref 13.0–17.0)
MCH: 27.6 pg (ref 26.0–34.0)
MCHC: 32.8 g/dL (ref 30.0–36.0)
MCV: 84.1 fL (ref 80.0–100.0)
Platelets: 164 10*3/uL (ref 150–400)
RBC: 4.35 MIL/uL (ref 4.22–5.81)
RDW: 12.8 % (ref 11.5–15.5)
WBC: 8.1 10*3/uL (ref 4.0–10.5)
nRBC: 0 % (ref 0.0–0.2)

## 2019-08-22 LAB — BASIC METABOLIC PANEL
Anion gap: 7 (ref 5–15)
BUN: 27 mg/dL — ABNORMAL HIGH (ref 8–23)
CO2: 27 mmol/L (ref 22–32)
Calcium: 9.1 mg/dL (ref 8.9–10.3)
Chloride: 99 mmol/L (ref 98–111)
Creatinine, Ser: 1.3 mg/dL — ABNORMAL HIGH (ref 0.61–1.24)
GFR calc Af Amer: >60 mL/min (ref >60)
GFR calc non Af Amer: 52 mL/min — ABNORMAL LOW (ref >60)
Glucose, Bld: 161 mg/dL — ABNORMAL HIGH (ref 70–99)
Potassium: 3.9 mmol/L (ref 3.5–5.1)
Sodium: 133 mmol/L — ABNORMAL LOW (ref 135–145)

## 2019-08-22 LAB — GLUCOSE, CAPILLARY
Glucose-Capillary: 155 mg/dL — ABNORMAL HIGH (ref 70–99)
Glucose-Capillary: 220 mg/dL — ABNORMAL HIGH (ref 70–99)
Glucose-Capillary: 155 mg/dL — ABNORMAL HIGH (ref 70–99)
Glucose-Capillary: 173 mg/dL — ABNORMAL HIGH (ref 70–99)

## 2019-08-22 MED ORDER — METOPROLOL TARTRATE 25 MG PO TABS
25.0000 mg | ORAL_TABLET | Freq: Two times a day (BID) | ORAL | Status: DC
  Administered 2019-08-22 – 2019-08-23 (×3): 25 mg via ORAL
  Filled 2019-08-22 (×3): qty 1

## 2019-08-22 MED ORDER — METOPROLOL TARTRATE 12.5 MG HALF TABLET
12.5000 mg | ORAL_TABLET | Freq: Once | ORAL | Status: AC
  Administered 2019-08-22: 12.5 mg via ORAL
  Filled 2019-08-22: qty 1

## 2019-08-22 MED ORDER — MIDODRINE HCL 5 MG PO TABS
5.0000 mg | ORAL_TABLET | Freq: Three times a day (TID) | ORAL | Status: DC
  Administered 2019-08-23 – 2019-08-30 (×22): 5 mg via ORAL
  Filled 2019-08-22 (×22): qty 1

## 2019-08-22 NOTE — Progress Notes (Signed)
Physical Therapy Treatment Patient Details Name: Alexis Griffith MRN: [id number] DOB: [birth year] Today's Date: 08/22/2019    History of Present Illness 79 y.o. male with a history of HTN, HLD, DMT2, OSA, history of CVA, multivessel CAD, recently diagnosed renal cell carcinoma and severe AS who presented to [facility] on 08/15/19 for TAVR. Course complicated by AMS, BP and HR drop, imaging reveals 4 mm acute infarct L medial frontal cortex.    PT Comments    Pt with good distance hallway ambulation this day, PT having pt sit after 50 ft ambulation due to + orthostatic hypotension although pt remained asymptomatic. + orthostatic hypotension x2 during mobility, recovered well with seated rest. Pt tolerated back-to-back OT then PT session well, demonstrating increased activity tolerance. PT to continue to follow acutely.  BP standing post 3-min ambulation: 79/52 BP seated 1 min post-ambulation: 121/61  BP standing post ambulation back to room: 71/51 BP seated 1 minute post-ambulation: 113/63    Follow Up Recommendations  Home health PT;Supervision/Assistance - 24 hour     Equipment Recommendations  None recommended by PT    Recommendations for Other Services       Precautions / Restrictions Precautions Precautions: Fall Precaution Comments: Watch BPs Restrictions Weight Bearing Restrictions: No RLE Weight Bearing: Weight bearing as tolerated LLE Weight Bearing: Weight bearing as tolerated    Mobility  Bed Mobility               General bed mobility comments: up with OT upon PT arrival  Transfers Overall transfer level: Needs assistance Equipment used: Rolling walker (2 wheeled) Transfers: Sit to/from Stand Sit to Stand: Min guard         General transfer comment: for safety, verbal cuing for hand placement when rising/sitting.  Ambulation/Gait Ambulation/Gait assistance: Min guard Gait Distance (Feet): 50 Feet (x2) Assistive device: Rolling walker (2 wheeled) Gait  Pattern/deviations: Step-through pattern;Decreased stride length;Trunk flexed Gait velocity: decr   General Gait Details: Min guard for safety, verbal cuing for upright posture. Pt conversant and pleasant throughout gait, orthostatic hypotension with each bout 50 ft ambulation (recovered with rest, see clinical impression).   Stairs             Wheelchair Mobility    Modified Rankin (Stroke Patients Only) Modified Rankin (Stroke Patients Only) Pre-Morbid Rankin Score: No significant disability Modified Rankin: Moderately severe disability     Balance Overall balance assessment: Needs assistance Sitting-balance support: Feet supported Sitting balance-Leahy Scale: Fair     Standing balance support: No upper extremity supported;During functional activity Standing balance-Leahy Scale: Fair Standing balance comment: use of RW dynamically                            Cognition Arousal/Alertness: Awake/alert Behavior During Therapy: WFL for tasks assessed/performed Overall Cognitive Status: Within Functional Limits for tasks assessed                                        Exercises General Exercises - Upper Extremity Shoulder Flexion: AROM;Both;10 reps;Seated (to recover BP) General Exercises - Lower Extremity Long Arc Quad: AROM;Both;10 reps;Seated Hip Flexion/Marching: AROM;Both;Seated;10 reps    General Comments        Pertinent Vitals/Pain Pain Assessment: No/denies pain Pain Intervention(s): Monitored during session    Home Living  Prior Function            PT Goals (current goals can now be found in the care plan section) Acute Rehab PT Goals Patient Stated Goal: go home PT Goal Formulation: With patient Time For Goal Achievement: 08/31/19 Potential to Achieve Goals: Good Progress towards PT goals: Progressing toward goals    Frequency    Min 3X/week      PT Plan Current plan remains  appropriate    Co-evaluation              AM-PAC PT "6 Clicks" Mobility   Outcome Measure  Help needed turning from your back to your side while in a flat bed without using bedrails?: None Help needed moving from lying on your back to sitting on the side of a flat bed without using bedrails?: None Help needed moving to and from a bed to a chair (including a wheelchair)?: A Little Help needed standing up from a chair using your arms (e.g., wheelchair or bedside chair)?: A Little Help needed to walk in hospital room?: A Little Help needed climbing 3-5 steps with a railing? : A Little 6 Click Score: 20    End of Session Equipment Utilized During Treatment: Gait belt Activity Tolerance: Patient tolerated treatment well Patient left: with call bell/phone within reach;in chair;with chair alarm set (bed alarm would not set) Nurse Communication: Mobility status PT Visit Diagnosis: Other abnormalities of gait and mobility (R26.89);Muscle weakness (generalized) (M62.81);Other symptoms and signs involving the nervous system (R29.898)     Time: 2951-8841 PT Time Calculation (min) (ACUTE ONLY): 19 min  Charges:  $Gait Training: 8-22 mins                    Pericles Carmicheal E, PT [place] Pager [phone]  Office [phone]   Roxine Caddy D Elonda Husky 08/22/2019, 12:29 PM

## 2019-08-22 NOTE — Progress Notes (Signed)
Occupational Therapy Treatment Patient Details Name: Alexis Griffith MRN: [id number] DOB: 1940-11-02 Today's Date: 08/22/2019    History of present illness 79 y.o. male with a history of HTN, HLD, DMT2, OSA, history of CVA, multivessel CAD, recently diagnosed renal cell carcinoma and severe AS who presented to [facility] on 08/15/19 for TAVR. Course complicated by AMS, BP and HR drop, imaging reveals 4 mm acute infarct L medial frontal cortex.   OT comments  Patient continues to make steady progress towards goals in skilled OT session. Patient's session encompassed functional ambulation to complete ADLs and assessment of BP in order to ensure safe ambulation. Pt remains anxious with regard to his BP as he now feels "its too high" as well as his HR and blood in his urine (RN was aware of concern). Pt required significant reassurance and redirection to participate but continues to make gains in overall function. Pt handed off to PT to complete further ambulation at end of session. Discharge remains appropriate; will continue to follow acutely.   BP Sitting EOB: 151/78 BP Standing: 133/65 (Please refer to PT note for BP's with ambulation)   Follow Up Recommendations  Home health OT;Supervision/Assistance - 24 hour    Equipment Recommendations  3 in 1 bedside commode    Recommendations for Other Services      Precautions / Restrictions Precautions Precautions: Fall Precaution Comments: Watch BPs Restrictions Weight Bearing Restrictions: No RLE Weight Bearing: Weight bearing as tolerated LLE Weight Bearing: Weight bearing as tolerated       Mobility Bed Mobility Overal bed mobility: Modified Independent         Sit to supine: Modified independent (Device/Increase time)   General bed mobility comments: up with OT upon PT arrival  Transfers Overall transfer level: Needs assistance Equipment used: Rolling walker (2 wheeled) Transfers: Sit to/from Stand Sit to Stand: Min guard          General transfer comment: for safety, verbal cuing for hand placement when rising/sitting.    Balance Overall balance assessment: Needs assistance Sitting-balance support: Feet supported Sitting balance-Leahy Scale: Fair     Standing balance support: No upper extremity supported;During functional activity Standing balance-Leahy Scale: Fair Standing balance comment: use of RW dynamically                           ADL either performed or assessed with clinical judgement   ADL Overall ADL's : Needs assistance/impaired     Grooming: Min guard;Cueing for safety;Oral care;Standing;Cueing for sequencing;Wash/dry face               Lower Body Dressing: Maximal assistance;Sit to/from stand;Sitting/lateral leans   Toilet Transfer: Minimal assistance;RW   Toileting- Clothing Manipulation and Hygiene: Minimal assistance;Sit to/from stand Toileting - Clothing Manipulation Details (indicate cue type and reason): For thoroughness in peri-care     Functional mobility during ADLs: Minimal assistance;Cueing for safety;Cueing for sequencing General ADL Comments: Pt with increased anxiety in session, peseverating on his numbers and the blood in his urine, BP stable to ambulate to sink and and then to bathroom, handed off to PT at end of treatment     Vision       Perception     Praxis      Cognition Arousal/Alertness: Awake/alert Behavior During Therapy: Anxious Overall Cognitive Status: Within Functional Limits for tasks assessed  General Comments: Requires increased redirection to maintain attention to task        Exercises General Exercises - Upper Extremity Shoulder Flexion: AROM;Both;10 reps;Seated (to recover BP) General Exercises - Lower Extremity Long Arc Quad: AROM;Both;10 reps;Seated Hip Flexion/Marching: AROM;Both;Seated;10 reps   Shoulder Instructions       General Comments      Pertinent Vitals/  Pain       Pain Assessment: No/denies pain Pain Intervention(s): Monitored during session  Home Living                                          Prior Functioning/Environment              Frequency  Min 2X/week        Progress Toward Goals  OT Goals(current goals can now be found in the care plan section)  Progress towards OT goals: Progressing toward goals  Acute Rehab OT Goals Patient Stated Goal: go home OT Goal Formulation: With patient Time For Goal Achievement: 08/31/19 Potential to Achieve Goals: Good  Plan Discharge plan remains appropriate    Co-evaluation                 AM-PAC OT "6 Clicks" Daily Activity     Outcome Measure   Help from another person eating meals?: None Help from another person taking care of personal grooming?: A Little Help from another person toileting, which includes using toliet, bedpan, or urinal?: A Little Help from another person bathing (including washing, rinsing, drying)?: A Little Help from another person to put on and taking off regular upper body clothing?: A Little Help from another person to put on and taking off regular lower body clothing?: A Lot 6 Click Score: 18    End of Session Equipment Utilized During Treatment: Gait belt;Rolling walker  OT Visit Diagnosis: Unsteadiness on feet (R26.81);Other abnormalities of gait and mobility (R26.89);Other symptoms and signs involving cognitive function   Activity Tolerance Patient tolerated treatment well   Patient Left  (Handed off to PT at end of session)   Nurse Communication Mobility status        Time: 9528-4132 OT Time Calculation (min): 35 min  Charges: OT General Charges $OT Visit: 1 Visit OT Treatments $Self Care/Home Management : 23-37 mins  Corinne Ports E. Mardella Nuckles, COTA/L Acute Rehabilitation Services [place] 08/22/2019, 2:32 PM

## 2019-08-22 NOTE — Progress Notes (Addendum)
[place] VALVE TEAM  Patient Name: Alexis Griffith Date of Encounter: 08/22/2019  Primary Cardiologist: Dr. Acie Fredrickson / Dr. Burt Knack & Dr. Cyndia Bent (TAVR)  Hospital Problem List     Principal Problem:   S/P TAVR (transcatheter aortic valve replacement) Active Problems:   PVC (premature ventricular contraction)   Hypertension   Hyperlipidemia   Carotid artery disease (HCC)   TIA (transient ischemic attack)   Coronary artery disease involving native coronary artery of native heart with angina pectoris ([place])   Acute on chronic diastolic heart failure ([place])   Diabetes mellitus without complication ([place])   Severe aortic stenosis   Sleep apnea   Renal mass   Subjective   Pt seems a little more depressed today. Coude cath removed. Small amount of blood on bed sheets. Has had worsening anxiety. HR/BP elevated.    Inpatient Medications    Scheduled Meds: . aspirin  81 mg Oral Daily  . Chlorhexidine Gluconate Cloth  6 each Topical Daily  . clonazePAM  0.5 mg Oral QID  . doxazosin  4 mg Oral QHS  . insulin aspart  0-24 Units Subcutaneous TID AC & HS  . insulin glargine  14 Units Subcutaneous Daily  . metoprolol tartrate  12.5 mg Oral BID  . polyethylene glycol  17 g Oral Daily  . rosuvastatin  5 mg Oral Daily  . sodium chloride flush  3 mL Intravenous Q12H   Continuous Infusions: . sodium chloride 20 mL/hr at 08/17/19 2131   PRN Meds: sodium chloride, acetaminophen **OR** acetaminophen, bismuth subsalicylate, morphine injection, ondansetron (ZOFRAN) IV, oxyCODONE, sodium chloride flush, traMADol, zolpidem   Vital Signs    Vitals:   08/22/19 0428 08/22/19 0547 08/22/19 0832 08/22/19 0845  BP: (!) 121/53  (!) 166/65 (!) 164/65  Pulse: 73   98  Resp: 12   20  Temp: 98 F (36.7 C)   97.9 F (36.6 C)  TempSrc: Oral   Oral  SpO2: 97%   94%  Weight:  87.7 kg    Height:        Intake/Output Summary (Last 24 hours) at 08/22/2019  0912 Last data filed at 08/22/2019 0543 Gross per 24 hour  Intake 480 ml  Output 4475 ml  Net -3995 ml   Filed Weights   08/20/19 0343 08/21/19 0529 08/22/19 0547  Weight: 86.9 kg 87.5 kg 87.7 kg    Physical Exam   GEN: Well nourished, well developed, in no acute distress. Laying in bed. More anxious and depressed looking today HEENT: Grossly normal.  Neck: Supple, no JVD, carotid bruits, or masses. Cardiac: RRR, soft flow murmur. No rubs, or gallops. No clubbing, cyanosis, edema.  Wearing compression stockings and abdominal binder Respiratory:  Respirations regular and unlabored, clear to auscultation bilaterally. GI: Soft, nontender, nondistended, BS + x 4. MS: no deformity or atrophy. Skin: warm and dry, no rash.  Groin sites clear without hematoma or ecchymosis. Some blood under penis on bed sheets Neuro:  Strength and sensation are intact. Psych: AAOx3.  Normal affect.  Labs    CBC Recent Labs    08/21/19 0612 08/22/19 0346  WBC 6.4 8.1  HGB 12.9* 12.0*  HCT 39.2 36.6*  MCV 83.8 84.1  PLT 151 751   Basic Metabolic Panel Recent Labs    08/21/19 0612 08/22/19 0346  NA 135 133*  K 4.1 3.9  CL 97* 99  CO2 29 27  GLUCOSE 161* 161*  BUN 26* 27*  CREATININE 1.24 1.30*  CALCIUM 9.5 9.1   Liver Function Tests No results for input(s): AST, ALT, ALKPHOS, BILITOT, PROT, ALBUMIN in the last 72 hours. No results for input(s): LIPASE, AMYLASE in the last 72 hours. Cardiac Enzymes No results for input(s): CKTOTAL, CKMB, CKMBINDEX, TROPONINI in the last 72 hours. BNP Invalid input(s): POCBNP D-Dimer No results for input(s): DDIMER in the last 72 hours. Hemoglobin A1C No results for input(s): HGBA1C in the last 72 hours. Fasting Lipid Panel No results for input(s): CHOL, HDL, LDLCALC, TRIG, CHOLHDL, LDLDIRECT in the last 72 hours. Thyroid Function Tests No results for input(s): TSH, T4TOTAL, T3FREE, THYROIDAB in the last 72 hours.  Invalid input(s):  FREET3  Telemetry    Sinus with some sinus tachy, rare PVCs, period of new BBB - Personally Reviewed  ECG    Sinus with 1st deg AV block (PR 235ms) from 6/15 - Personally Reviewed  Radiology    No results found.  Cardiac Studies    TAVR OPERATIVE NOTE   Date of Procedure:                08/15/2019  Preoperative Diagnosis:      Severe Aortic Stenosis   Postoperative Diagnosis:    Same   Procedure:        Transcatheter Aortic Valve Replacement - Percutaneous Right Transfemoral Approach             Edwards Sapien 3 Ultra THV (size 26 mm, model # 9750TFX, serial # [id number])              Co-Surgeons:                        Gaye Pollack, MD and Sherren Mocha, MD    Anesthesiologist:                  Annye Asa, MD  Echocardiographer:              Bertrum Sol, MD  Pre-operative Echo Findings: ? Severe aortic stenosis ? Normal left ventricular systolic function  Post-operative Echo Findings: ? NO paravalvular leak ? Normal left ventricular systolic function   _________________   Echo 08/16/19:  IMPRESSIONS  1. Left ventricular ejection fraction, by estimation, is 55 to 60%. The  left ventricle has normal function. The left ventricle demonstrates  regional wall motion abnormalities (see scoring diagram/findings for  description). There is mild concentric left ventricular hypertrophy. Indeterminate diastolic filling due to E-A fusion.  2. Right ventricular systolic function is normal. The right ventricular  size is normal.  3. The mitral valve is normal in structure. No evidence of mitral valve  regurgitation. No evidence of mitral stenosis.  4. The aortic valve is normal in structure. Aortic valve regurgitation is  not visualized. No aortic stenosis is present. Aortic valve mean gradient  measures 11.7 mmHg. Aortic valve Vmax measures 2.48 m/s.  5. The inferior vena cava is normal in size with greater than 50%  respiratory variability,  suggesting right atrial pressure of 3 mmHg.   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  No significant chronic ischemic change.   _______________  Ct angio head/neck 08/16/19 IMPRESSION: 1. Progressive cervical carotid atherosclerosis with 75% right and 65-70% left proximal ICA stenoses. 2. 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 3. New moderate right and mild left M1 stenoses. 4. Unchanged mild left  intracranial ICA stenosis. 5. Aortic Atherosclerosis (ICD10-I70.0).   Patient Profile     Alexis Griffith is a 79 y.o. male with a history of HTN, HLD, DMT2, OSA, history of CVA, multivessel CAD, recently diagnosed renal cell carcinoma and severe AS who presented to [facility] on 08/15/19 for planned TAVR.  Assessment & Plan    Severe AS: s/p successful TAVR with a 26 mm Edwards Sapien 3 THV via the TF approach on 08/15/19. Post operative echo showed EF 55%, normally functioning TAVR with a mean gradient of 11.7 mm hg and no PVL. Groin sites are stable. ECG with sinus and 1st deg AV block (old) and no high grade heart block. Started on Asprin and Plavix (home Aggrenox discontinued). Plavix now discontinued with gross hematuria. Prolonged admission due to issues with acute urinary retention, hematuria and orthostatic hypotension with recurrent syncope.   Acute on chronic diastolic CHF: as evidenced by an elevated BNP on pre admission lab work. This has been treated with TAVR. Home diuretics on hold with orthostatic hypotension  AKI: creat peaked at 1.74. Now down to 1.3  Cerebrovascular disease:MR brain showed a4 mm acute infarct left medial frontalcortex, which was felt to be incidental and procedural related. CTA of the head and neck show moderate 65 to 75% bilateral ICA stenoses but will require continued surveillance and outpatient follow-up.  Orthostatic hypotension with  recurrent syncope: all BP meds were held. Now back on Metoprolol 12.5mg  BID (home 25mg  TID) with rebound tachycardia. Wearing an abdominal binder and compression stockings. Continue doxazosin (old medication) as he has BPH with acute urinary retention.   HTN: allowing for permissive supine hypertension given significant ortho stasis. His HR is elevated to 105 bpm while laying in bed. Will increase Lopressor from 12.5mg  BID to 25mg  BID.  DMT2: continue SSI  Acute urinary retention with hematuria: s/p 60 French coud catheterplacement by urology on 6/16. Had gross hematuria which resolved when we discontinued plavix. Now with scan blood on bed sheets. Per urology recommendation, we removed Coude cath this AM and doing voiding trial today. If he cannot urinate on his own, we will need to replace [place] cath. Continue doxazosin  RCC: pre TAVR CT showed a large RCC and pulmonary nodules. Follow up PET scan did not show evidence of metastatic disease. He has an apt with Dr. Tresa Moore with urology at the end of this month.  Pulmonary nodules: PET scan showed a dominant right lower lobe 1.2 cm solid pulmonary nodule is non-hypermetabolic. Additional small solid subcentimeter pulmonary nodules in both lungs are below PET resolution and warrant attention on follow-up chest CT in 3 months. This will be followed over time.  SignedAngelena Form, PA-C  08/22/2019, 9:12 AM  Pager [phone]  Patient seen, examined. Available data reviewed. Agree with findings, assessment, and plan as outlined by Nell Range, PA-C.  On my exam the patient is alert, oriented, no distress.  JVP is normal, lungs are clear, heart is regular rate and rhythm with a soft ejection murmur at the right upper sternal border, abdomen is soft and nontender, extremities have no edema.  Compression stockings are in place.  The patient has now had his Foley catheter removed.  He was able to void on his own today.  He also had a bowel movement  today.  He did have hypotension when up on his feet again.  This is currently the rate limiting issue for hospital discharge.  Even with an abdominal binder and compression stockings he continues  to have significant orthostasis.  We will try him on ProAmatine 5 mg with meals starting tomorrow morning.  Sherren Mocha, M.D. 08/22/2019 5:22 PM

## 2019-08-22 NOTE — Progress Notes (Signed)
Mobility Specialist - Progress Note   08/22/19 1357  Mobility  Activity Ambulated in hall  Level of Assistance Modified independent, requires aide device or extra time  Assistive Device Front wheel walker  Distance Ambulated (ft) 250 ft  Mobility Response Tolerated well  Mobility performed by Mobility specialist  $Mobility charge 1 Mobility    Pre-mobility   Sitting: 88 HR, 150/55 BP, 95% SpO2  Standing: 100 HR, 117/54 BP, 97% SpO2  Standing at 3 min: 99 HR, 133/56 BP, 96% SpO2 During mobility: 99 HR, 125/50 BP, 96% SpO2 Post-mobility: 83 HR, 134/71 BP, 96% SpO2   Alexis Griffith Transport planner Phone: [phone]

## 2019-08-23 ENCOUNTER — Ambulatory Visit: Payer: Medicare Other | Admitting: Physician Assistant

## 2019-08-23 DIAGNOSIS — I350 Nonrheumatic aortic (valve) stenosis: Principal | ICD-10-CM

## 2019-08-23 LAB — GLUCOSE, CAPILLARY
Glucose-Capillary: 138 mg/dL — ABNORMAL HIGH (ref 70–99)
Glucose-Capillary: 236 mg/dL — ABNORMAL HIGH (ref 70–99)
Glucose-Capillary: 158 mg/dL — ABNORMAL HIGH (ref 70–99)
Glucose-Capillary: 190 mg/dL — ABNORMAL HIGH (ref 70–99)

## 2019-08-23 LAB — BASIC METABOLIC PANEL
Anion gap: 10 (ref 5–15)
BUN: 28 mg/dL — ABNORMAL HIGH (ref 8–23)
CO2: 27 mmol/L (ref 22–32)
Calcium: 9.2 mg/dL (ref 8.9–10.3)
Chloride: 99 mmol/L (ref 98–111)
Creatinine, Ser: 1.18 mg/dL (ref 0.61–1.24)
GFR calc Af Amer: >60 mL/min (ref >60)
GFR calc non Af Amer: 59 mL/min — ABNORMAL LOW (ref >60)
Glucose, Bld: 151 mg/dL — ABNORMAL HIGH (ref 70–99)
Potassium: 4 mmol/L (ref 3.5–5.1)
Sodium: 136 mmol/L (ref 135–145)

## 2019-08-23 LAB — CBC
HCT: 37.4 % — ABNORMAL LOW (ref 39.0–52.0)
Hemoglobin: 12.4 g/dL — ABNORMAL LOW (ref 13.0–17.0)
MCH: 27.9 pg (ref 26.0–34.0)
MCHC: 33.2 g/dL (ref 30.0–36.0)
MCV: 84.2 fL (ref 80.0–100.0)
Platelets: 168 10*3/uL (ref 150–400)
RBC: 4.44 MIL/uL (ref 4.22–5.81)
RDW: 12.9 % (ref 11.5–15.5)
WBC: 8 10*3/uL (ref 4.0–10.5)
nRBC: 0 % (ref 0.0–0.2)

## 2019-08-23 NOTE — Progress Notes (Signed)
Mobility Specialist - Progress Note   08/23/19 1402  Mobility  Activity Ambulated in hall  Level of Assistance Modified independent, requires aide device or extra time  Assistive Device Front wheel walker  Distance Ambulated (ft) 250 ft  Mobility Response Tolerated well  Mobility performed by Mobility specialist  $Mobility charge 1 Mobility    Pre-mobility:  Laying: 88 HR, 160/66 BP, 97% SpO2  Standing: 103 HR, 159/73, 94% SpO2  Standing at 3 min: 97 HR, 141/60 BP, 94% SpO2 During mobility: 95 HR, 106/59 BP, 94% SpO2 Post-mobility: 90 HR, 154/55 BP, 91% SpO2  Pt lost balance when putting on his mask prior to exiting the room, I was able to correct him safely. I left him lying in his bed with his bed alarm on, call bell and phone within reach.  [place] Specialist

## 2019-08-23 NOTE — Progress Notes (Addendum)
[place] VALVE TEAM  Patient Name: Alexis Griffith Date of Encounter: 08/23/2019  Primary Cardiologist: Dr. Acie Fredrickson / Dr. Burt Knack & Dr. Cyndia Bent (TAVR)  Hospital Problem List     Principal Problem:   S/P TAVR (transcatheter aortic valve replacement) Active Problems:   PVC (premature ventricular contraction)   Hypertension   Hyperlipidemia   Carotid artery disease (HCC)   TIA (transient ischemic attack)   Coronary artery disease involving native coronary artery of native heart with angina pectoris ([place])   Acute on chronic diastolic heart failure ([place])   Diabetes mellitus without complication ([place])   Severe aortic stenosis   Sleep apnea   Renal mass   Subjective   In better spirits this morning. No complaints. Urinating on his own with only a little bit of hematuria. No recurrent syncope over past couple days.   Inpatient Medications    Scheduled Meds: . aspirin  81 mg Oral Daily  . Chlorhexidine Gluconate Cloth  6 each Topical Daily  . clonazePAM  0.5 mg Oral QID  . doxazosin  4 mg Oral QHS  . insulin aspart  0-24 Units Subcutaneous TID AC & HS  . insulin glargine  14 Units Subcutaneous Daily  . metoprolol tartrate  25 mg Oral BID  . midodrine  5 mg Oral TID WC  . polyethylene glycol  17 g Oral Daily  . rosuvastatin  5 mg Oral Daily  . sodium chloride flush  3 mL Intravenous Q12H   Continuous Infusions: . sodium chloride 20 mL/hr at 08/17/19 2131   PRN Meds: sodium chloride, acetaminophen **OR** acetaminophen, bismuth subsalicylate, morphine injection, ondansetron (ZOFRAN) IV, oxyCODONE, sodium chloride flush, traMADol, zolpidem   Vital Signs    Vitals:   08/22/19 2337 08/23/19 0418 08/23/19 0623 08/23/19 0837  BP: (!) 111/52 (!) 122/57  (!) 154/54  Pulse: 74 74  80  Resp: 18 13  14   Temp: 98.3 F (36.8 C) 97.9 F (36.6 C)  98.2 F (36.8 C)  TempSrc: Oral Oral  Oral  SpO2: 97%   94%  Weight:   86.9 kg   Height:         Intake/Output Summary (Last 24 hours) at 08/23/2019 0947 Last data filed at 08/23/2019 0925 Gross per 24 hour  Intake 1090 ml  Output 1751 ml  Net -661 ml   Filed Weights   08/21/19 0529 08/22/19 0547 08/23/19 0623  Weight: 87.5 kg 87.7 kg 86.9 kg    Physical Exam   GEN: Well nourished, well developed, in no acute distress. Laying in bed. More anxious and depressed looking today HEENT: Grossly normal.  Neck: Supple, no JVD, carotid bruits, or masses. Cardiac: RRR, soft flow murmur. No rubs, or gallops. No clubbing, cyanosis, edema.  Wearing compression stockings and abdominal binder Respiratory:  Respirations regular and unlabored, clear to auscultation bilaterally. GI: Soft, nontender, nondistended, BS + x 4. MS: no deformity or atrophy. Skin: warm and dry, no rash.  Groin sites healing well. Bandages removed, small areas of ecchymosis without hematoma.  Neuro:  Strength and sensation are intact. Psych: AAOx3.  Normal affect.  Labs    CBC Recent Labs    08/22/19 0346 08/23/19 0220  WBC 8.1 8.0  HGB 12.0* 12.4*  HCT 36.6* 37.4*  MCV 84.1 84.2  PLT 164 381   Basic Metabolic Panel Recent Labs    08/22/19 0346 08/23/19 0220  NA 133* 136  K 3.9 4.0  CL 99 99  CO2 27 27  GLUCOSE 161* 151*  BUN 27* 28*  CREATININE 1.30* 1.18  CALCIUM 9.1 9.2   Liver Function Tests No results for input(s): AST, ALT, ALKPHOS, BILITOT, PROT, ALBUMIN in the last 72 hours. No results for input(s): LIPASE, AMYLASE in the last 72 hours. Cardiac Enzymes No results for input(s): CKTOTAL, CKMB, CKMBINDEX, TROPONINI in the last 72 hours. BNP Invalid input(s): POCBNP D-Dimer No results for input(s): DDIMER in the last 72 hours. Hemoglobin A1C No results for input(s): HGBA1C in the last 72 hours. Fasting Lipid Panel No results for input(s): CHOL, HDL, LDLCALC, TRIG, CHOLHDL, LDLDIRECT in the last 72 hours. Thyroid Function Tests No results for input(s): TSH, T4TOTAL, T3FREE,  THYROIDAB in the last 72 hours.  Invalid input(s): FREET3  Telemetry    Sinus, few periods of sinus tachy. few PVCs - Personally Reviewed  ECG    Sinus with 1st deg AV block (PR 232ms) from 6/15 - Personally Reviewed  Radiology    No results found.  Cardiac Studies    TAVR OPERATIVE NOTE   Date of Procedure:                08/15/2019  Preoperative Diagnosis:      Severe Aortic Stenosis   Postoperative Diagnosis:    Same   Procedure:        Transcatheter Aortic Valve Replacement - Percutaneous Right Transfemoral Approach             Edwards Sapien 3 Ultra THV (size 26 mm, model # 9750TFX, serial # [id number])              Co-Surgeons:                        Gaye Pollack, MD and Sherren Mocha, MD    Anesthesiologist:                  Annye Asa, MD  Echocardiographer:              Bertrum Sol, MD  Pre-operative Echo Findings: ? Severe aortic stenosis ? Normal left ventricular systolic function  Post-operative Echo Findings: ? NO paravalvular leak ? Normal left ventricular systolic function   _________________   Echo 08/16/19:  IMPRESSIONS  1. Left ventricular ejection fraction, by estimation, is 55 to 60%. The  left ventricle has normal function. The left ventricle demonstrates  regional wall motion abnormalities (see scoring diagram/findings for  description). There is mild concentric left ventricular hypertrophy. Indeterminate diastolic filling due to E-A fusion.  2. Right ventricular systolic function is normal. The right ventricular  size is normal.  3. The mitral valve is normal in structure. No evidence of mitral valve  regurgitation. No evidence of mitral stenosis.  4. The aortic valve is normal in structure. Aortic valve regurgitation is  not visualized. No aortic stenosis is present. Aortic valve mean gradient  measures 11.7 mmHg. Aortic valve Vmax measures 2.48 m/s.  5. The inferior vena cava is normal in size with greater  than 50%  respiratory variability, suggesting right atrial pressure of 3 mmHg.   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  No significant chronic ischemic change.   _______________  Ct angio head/neck 08/16/19 IMPRESSION: 1. Progressive cervical carotid atherosclerosis with 75% right and 65-70% left proximal ICA stenoses. 2. 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 3. New moderate right  and mild left M1 stenoses. 4. Unchanged mild left intracranial ICA stenosis. 5. Aortic Atherosclerosis (ICD10-I70.0).   Patient Profile     SAAHAS HIDROGO is a 79 y.o. male with a history of HTN, HLD, DMT2, OSA, history of CVA, multivessel CAD, recently diagnosed renal cell carcinoma and severe AS who presented to [facility] on 08/15/19 for planned TAVR.  Assessment & Plan    Severe AS: s/p successful TAVR with a 26 mm Edwards Sapien 3 THV via the TF approach on 08/15/19. Post operative echo showed EF 55%, normally functioning TAVR with a mean gradient of 11.7 mm hg and no PVL. Groin sites are stable. ECG with sinus and 1st deg AV block (old) and no high grade heart block. Started on Asprin and Plavix (home Aggrenox discontinued). Plavix now discontinued with gross hematuria. Prolonged admission due to issues with acute urinary retention, hematuria and orthostatic hypotension with recurrent syncope.   Acute on chronic diastolic CHF: as evidenced by an elevated BNP on pre admission lab work. This has been treated with TAVR. Home diuretics on hold with orthostatic hypotension  AKI: creat peaked at 1.74. Now down to 1.18.  Cerebrovascular disease:MR brain showed a4 mm acute infarct left medial frontalcortex, which was felt to be incidental and procedural related. CTA of the head and neck show moderate 65 to 75% bilateral ICA stenoses but will require continued surveillance and outpatient  follow-up.  Orthostatic hypotension with recurrent syncope: all BP meds were held. Now back on Metoprolol 25 BID (home 25mg  TID) with rebound tachycardia. Wearing an abdominal binder and compression stockings. Continue doxazosin (old medication) as he has BPH with acute urinary retention. We have added midodrine 5mg  TID. If he improves with this, possible DC home later tonight when granddaughter gets off work   HTN: allowing for permissive supine hypertension given significant orthostasis. Continue on Lopressor 25mg  BID (added back for rebound sinus tach) and doxazosin.   DMT2: continue SSI  Acute urinary retention with hematuria: had a temporoary coude cath placed. Plavix discontinued and hematuria improved. Now cath removed and pt able to void on his own. Continue on home doxazosin.   RCC: pre TAVR CT showed a large RCC and pulmonary nodules. Follow up PET scan did not show evidence of metastatic disease. He has an apt with Dr. Tresa Moore with urology at the end of this month.  Pulmonary nodules: PET scan showed a dominant right lower lobe 1.2 cm solid pulmonary nodule is non-hypermetabolic. Additional small solid subcentimeter pulmonary nodules in both lungs are below PET resolution and warrant attention on follow-up chest CT in 3 months. This will be followed over time.  SignedAngelena Form, PA-C  08/23/2019, 9:47 AM  Pager [phone]  Patient seen, examined. Available data reviewed. Agree with findings, assessment, and plan as outlined by Nell Range, PA-C. The patient is in good spirits this morning on rounds. On my exam, he is alert, oriented, in NAD. Lungs CTA, heart RRR 2/6 SEM at RUSB, abd: soft, NT, ext: no edema. Tele shows sinus rhythm. As long as bis orthostatic changes are improved on midodrine, he should be ready for DC over the next 24 hours. He might benefit from HHPT.   Sherren Mocha, M.D. 08/23/2019 5:43 PM

## 2019-08-23 NOTE — Plan of Care (Signed)
Continue to monitor

## 2019-08-24 ENCOUNTER — Inpatient Hospital Stay (HOSPITAL_COMMUNITY): Payer: Medicare Other

## 2019-08-24 DIAGNOSIS — N3001 Acute cystitis with hematuria: Secondary | ICD-10-CM

## 2019-08-24 LAB — URINALYSIS, ROUTINE W REFLEX MICROSCOPIC
Bilirubin Urine: NEGATIVE
Glucose, UA: NEGATIVE mg/dL
Ketones, ur: NEGATIVE mg/dL
Nitrite: POSITIVE — AB
Protein, ur: 100 mg/dL — AB
Specific Gravity, Urine: 1.023 (ref 1.005–1.030)
WBC, UA: >50 WBC/hpf — ABNORMAL HIGH (ref 0–5)
pH: 5 (ref 5.0–8.0)

## 2019-08-24 LAB — GLUCOSE, CAPILLARY
Glucose-Capillary: 179 mg/dL — ABNORMAL HIGH (ref 70–99)
Glucose-Capillary: 277 mg/dL — ABNORMAL HIGH (ref 70–99)
Glucose-Capillary: 145 mg/dL — ABNORMAL HIGH (ref 70–99)
Glucose-Capillary: 188 mg/dL — ABNORMAL HIGH (ref 70–99)

## 2019-08-24 LAB — BASIC METABOLIC PANEL
Anion gap: 10 (ref 5–15)
BUN: 23 mg/dL (ref 8–23)
CO2: 26 mmol/L (ref 22–32)
Calcium: 9.1 mg/dL (ref 8.9–10.3)
Chloride: 98 mmol/L (ref 98–111)
Creatinine, Ser: 1.15 mg/dL (ref 0.61–1.24)
GFR calc Af Amer: >60 mL/min (ref >60)
GFR calc non Af Amer: >60 mL/min (ref >60)
Glucose, Bld: 170 mg/dL — ABNORMAL HIGH (ref 70–99)
Potassium: 4.2 mmol/L (ref 3.5–5.1)
Sodium: 134 mmol/L — ABNORMAL LOW (ref 135–145)

## 2019-08-24 LAB — CBC
HCT: 38 % — ABNORMAL LOW (ref 39.0–52.0)
Hemoglobin: 12.6 g/dL — ABNORMAL LOW (ref 13.0–17.0)
MCH: 27.8 pg (ref 26.0–34.0)
MCHC: 33.2 g/dL (ref 30.0–36.0)
MCV: 83.9 fL (ref 80.0–100.0)
Platelets: 163 10*3/uL (ref 150–400)
RBC: 4.53 MIL/uL (ref 4.22–5.81)
RDW: 12.8 % (ref 11.5–15.5)
WBC: 14.7 10*3/uL — ABNORMAL HIGH (ref 4.0–10.5)
nRBC: 0 % (ref 0.0–0.2)

## 2019-08-24 LAB — CORTISOL: Cortisol, Plasma: 24.3 ug/dL

## 2019-08-24 LAB — TSH: TSH: 2.224 u[IU]/mL (ref 0.350–4.500)

## 2019-08-24 MED ORDER — CIPROFLOXACIN HCL 500 MG PO TABS: 500.0000 mg | ORAL_TABLET | Freq: Two times a day (BID) | ORAL | Status: DC

## 2019-08-24 MED ORDER — IOHEXOL 9 MG/ML PO SOLN
500.0000 mL | ORAL | Status: AC
  Administered 2019-08-24 (×2): 500 mL via ORAL

## 2019-08-24 MED ORDER — IOHEXOL 300 MG/ML  SOLN
100.0000 mL | Freq: Once | INTRAMUSCULAR | Status: AC | PRN
  Administered 2019-08-24: 100 mL via INTRAVENOUS

## 2019-08-24 MED ORDER — METOPROLOL TARTRATE 12.5 MG HALF TABLET
12.5000 mg | ORAL_TABLET | Freq: Two times a day (BID) | ORAL | Status: DC
  Administered 2019-08-24 – 2019-08-31 (×15): 12.5 mg via ORAL
  Filled 2019-08-24 (×15): qty 1

## 2019-08-24 MED ORDER — CIPROFLOXACIN HCL 500 MG PO TABS
500.0000 mg | ORAL_TABLET | Freq: Two times a day (BID) | ORAL | Status: DC
  Administered 2019-08-24 – 2019-08-30 (×12): 500 mg via ORAL
  Filled 2019-08-24 (×12): qty 1

## 2019-08-24 MED ORDER — SODIUM CHLORIDE 0.9 % IV BOLUS
1000.0000 mL | Freq: Once | INTRAVENOUS | Status: AC
  Administered 2019-08-24: 1000 mL via INTRAVENOUS

## 2019-08-24 NOTE — Plan of Care (Signed)
Continue to monitor

## 2019-08-24 NOTE — Progress Notes (Signed)
I was called to evaluate patient this evening.  He was seen by Dr. Fredderick Phenix and Dr. Alinda Money consult.  Foley catheter was placed for retention and some gross hematuria which cleared.  Foley catheter was removed a couple of days ago.  Today, he has had abdominal pain, frequency and straining to urinate.  Nurse bladder scanned him this evening and it read over 700 cc.  He had a CT scan earlier today which showed the left renal mass, a distended bladder and a large prostate.  UA today showed many bacteria, greater than 50 white cells, 21-50 red cells, positive leukocyte and positive nitrite.  I did not see a urine culture, patient on Cipro.   He appears slightly uncomfortable in bed.  He is trying to void in a urinal.  I discussed with him Foley catheter placement in the penis was prepped and draped in the usual sterile fashion and an 65 [place] coud catheter was placed without difficulty.  It was left to gravity drainage.  Urine was clear.  He drained about 700 to 800 cc.  Impression/plan:  BPH-he is on mid dose Cardura and if he could tolerate it I would start him on tamsulosin 0.4 mg p.o. nightly.  In the long run he would probably do well with finasteride but that will be a conversation he will need to have at follow-up.  Left renal mass-follow-up with Dr. Tresa Moore as scheduled 08/29/2019.   Urinary retention-status post Foley catheter placement.  I would leave the catheter for another 4 to 5 days.  Consider voiding trial at follow-up with Dr. Tresa Moore.   I will sign off, but please notify GU on call of any questions, concerns or changes in patient's status.

## 2019-08-24 NOTE — Progress Notes (Addendum)
Orthostatic BP's done. Patient sitting 137/63, standing 86/23 left arm,   right arm 1462/62 sitting, 85/42 standing, at three minutes 82/43. Patient c/o of dizziness with standing. HR 96-110. Midodrine given at this time. Right and left groins clean dry and intact. Right groin small hematoma noted.  Bonney Leitz, PA paged at this time.

## 2019-08-24 NOTE — Progress Notes (Signed)
CARDIAC REHAB PHASE I   Went to offer to walk with pt, pt up in BR with mobility tech. Discouraged by long hospital stay. Provided support and encouragement . Will continue to follow throughout hospital stay.  Rufina Falco, RN BSN 08/24/2019 2:29 PM

## 2019-08-24 NOTE — Progress Notes (Signed)
Mobility Specialist - Progress Note   08/24/19 1420  Mobility  Activity Ambulated to bathroom  Level of Assistance Modified independent, requires aide device or extra time  Assistive Device Front wheel walker  Distance Ambulated (ft) 40 ft  Mobility Response Tolerated fair  Mobility performed by Mobility specialist  $Mobility charge 1 Mobility    Pre-mobility:   Sitting in chair: 79 HR, 74/60 BP, 95% SpO2  Standing: 90 HR, 108/51 BP, 96%  Standing at 3 min: 84 HR, 125/47 BP, 96% Post-mobility: 87 HR, 146/62 BP, 97% SpO2  Did not ambulate any further aside from walking to the bathroom as pt endorsed mild dizziness while standing.  Pricilla Handler Mobility Specialist Mobility Specialist Phone: [phone]

## 2019-08-24 NOTE — Progress Notes (Addendum)
[facility] VALVE TEAM  Patient Name: Alexis Griffith Date of Encounter: 08/24/2019  Primary Cardiologist: Dr. Acie Fredrickson / Dr. Burt Knack & Dr. Cyndia Bent (TAVR)  Hospital Problem List     Principal Problem:   S/P TAVR (transcatheter aortic valve replacement) Active Problems:   PVC (premature ventricular contraction)   Hypertension   Hyperlipidemia   Carotid artery disease (HCC)   TIA (transient ischemic attack)   Coronary artery disease involving native coronary artery of native heart with angina pectoris ([place])   Acute on chronic diastolic heart failure ([place])   Diabetes mellitus without complication (HCC)   Severe aortic stenosis   Sleep apnea   Renal mass   Subjective   Had nausea last night to the point he could not eat his food. Feeling better this AM but now will abdominal pain and pressure. No dysuria. Chronic frequency. He says he just cannot go home feeling like this.   Inpatient Medications    Scheduled Meds: . aspirin  81 mg Oral Daily  . Chlorhexidine Gluconate Cloth  6 each Topical Daily  . clonazePAM  0.5 mg Oral QID  . doxazosin  4 mg Oral QHS  . insulin aspart  0-24 Units Subcutaneous TID AC & HS  . insulin glargine  14 Units Subcutaneous Daily  . metoprolol tartrate  25 mg Oral BID  . midodrine  5 mg Oral TID WC  . polyethylene glycol  17 g Oral Daily  . rosuvastatin  5 mg Oral Daily  . sodium chloride flush  3 mL Intravenous Q12H   Continuous Infusions: . sodium chloride 20 mL/hr at 08/17/19 2131   PRN Meds: sodium chloride, acetaminophen **OR** acetaminophen, bismuth subsalicylate, morphine injection, ondansetron (ZOFRAN) IV, oxyCODONE, sodium chloride flush, traMADol, zolpidem   Vital Signs    Vitals:   08/24/19 0500 08/24/19 0546 08/24/19 0745 08/24/19 0807  BP:  (!) 129/54 137/63 (!) 142/52  Pulse:  91 96   Resp:  17 14 (!) 21  Temp:  99.8 F (37.7 C) 99.1 F (37.3 C)   TempSrc:  Oral Oral   SpO2:  95% 94%    Weight: 86.3 kg     Height:        Intake/Output Summary (Last 24 hours) at 08/24/2019 0945 Last data filed at 08/24/2019 0900 Gross per 24 hour  Intake 360 ml  Output 1825 ml  Net -1465 ml   Filed Weights   08/22/19 0547 08/23/19 0623 08/24/19 0500  Weight: 87.7 kg 86.9 kg 86.3 kg    Physical Exam   GEN: Well nourished, well developed, in no acute distress. Up sitting in chair  Today eating breakfast HEENT: Grossly normal.  Neck: Supple, no JVD, carotid bruits, or masses. Cardiac: RRR, soft flow murmur. No rubs, or gallops. No clubbing, cyanosis, edema.  Wearing compression stockings and abdominal binder Respiratory:  Respirations regular and unlabored, clear to auscultation bilaterally. GI: tender to palpation over mid belly bilaterally but L>R MS: no deformity or atrophy. Skin: warm and dry, no rash.  Groin sites healing well. Bandages removed, small areas of ecchymosis without hematoma.  Neuro:  Strength and sensation are intact. Psych: AAOx3.  Normal affect.  Labs    CBC Recent Labs    08/23/19 0220 08/24/19 0349  WBC 8.0 14.7*  HGB 12.4* 12.6*  HCT 37.4* 38.0*  MCV 84.2 83.9  PLT 168 660   Basic Metabolic Panel Recent Labs    08/23/19 0220 08/24/19 0349  NA  136 134*  K 4.0 4.2  CL 99 98  CO2 27 26  GLUCOSE 151* 170*  BUN 28* 23  CREATININE 1.18 1.15  CALCIUM 9.2 9.1   Liver Function Tests No results for input(s): AST, ALT, ALKPHOS, BILITOT, PROT, ALBUMIN in the last 72 hours. No results for input(s): LIPASE, AMYLASE in the last 72 hours. Cardiac Enzymes No results for input(s): CKTOTAL, CKMB, CKMBINDEX, TROPONINI in the last 72 hours. BNP Invalid input(s): POCBNP D-Dimer No results for input(s): DDIMER in the last 72 hours. Hemoglobin A1C No results for input(s): HGBA1C in the last 72 hours. Fasting Lipid Panel No results for input(s): CHOL, HDL, LDLCALC, TRIG, CHOLHDL, LDLDIRECT in the last 72 hours. Thyroid Function Tests No results for  input(s): TSH, T4TOTAL, T3FREE, THYROIDAB in the last 72 hours.  Invalid input(s): FREET3  Telemetry    sinus - Personally Reviewed  ECG    Sinus with 1st deg AV block (PR 238ms) from 6/15 - Personally Reviewed  Radiology    No results found.  Cardiac Studies    TAVR OPERATIVE NOTE   Date of Procedure:                08/15/2019  Preoperative Diagnosis:      Severe Aortic Stenosis   Postoperative Diagnosis:    Same   Procedure:        Transcatheter Aortic Valve Replacement - Percutaneous Right Transfemoral Approach             Edwards Sapien 3 Ultra THV (size 26 mm, model # 9750TFX, serial # [id number])              Co-Surgeons:                        Gaye Pollack, MD and Sherren Mocha, MD    Anesthesiologist:                  Annye Asa, MD  Echocardiographer:              Bertrum Sol, MD  Pre-operative Echo Findings: ? Severe aortic stenosis ? Normal left ventricular systolic function  Post-operative Echo Findings: ? NO paravalvular leak ? Normal left ventricular systolic function   _________________   Echo 08/16/19:  IMPRESSIONS  1. Left ventricular ejection fraction, by estimation, is 55 to 60%. The  left ventricle has normal function. The left ventricle demonstrates  regional wall motion abnormalities (see scoring diagram/findings for  description). There is mild concentric left ventricular hypertrophy. Indeterminate diastolic filling due to E-A fusion.  2. Right ventricular systolic function is normal. The right ventricular  size is normal.  3. The mitral valve is normal in structure. No evidence of mitral valve  regurgitation. No evidence of mitral stenosis.  4. The aortic valve is normal in structure. Aortic valve regurgitation is  not visualized. No aortic stenosis is present. Aortic valve mean gradient  measures 11.7 mmHg. Aortic valve Vmax measures 2.48 m/s.  5. The inferior vena cava is normal in size with greater than  50%  respiratory variability, suggesting right atrial pressure of 3 mmHg.   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  No significant chronic ischemic change.   _______________  Ct angio head/neck 08/16/19 IMPRESSION: 1. Progressive cervical carotid atherosclerosis with 75% right and 65-70% left proximal ICA stenoses. 2. 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  3. New moderate right and mild left M1 stenoses. 4. Unchanged mild left intracranial ICA stenosis. 5. Aortic Atherosclerosis (ICD10-I70.0).   Patient Profile     Alexis Griffith is a 79 y.o. male with a history of HTN, HLD, DMT2, OSA, history of CVA, multivessel CAD, recently diagnosed renal cell carcinoma and severe AS who presented to [facility] on 08/15/19 for planned TAVR.  Assessment & Plan    Severe AS: s/p successful TAVR with a 26 mm Edwards Sapien 3 THV via the TF approach on 08/15/19. Post operative echo showed EF 55%, normally functioning TAVR with a mean gradient of 11.7 mm hg and no PVL. Groin sites are stable. ECG with sinus and 1st deg AV block (old) and no high grade heart block. Started on Asprin and Plavix (home Aggrenox discontinued). Plavix now discontinued with gross hematuria. Prolonged admission due to issues with acute urinary retention, hematuria and orthostatic hypotension with recurrent syncope.   Acute on chronic diastolic CHF: as evidenced by an elevated BNP on pre admission lab work. This has been treated with TAVR. Home diuretics on hold with orthostatic hypotension  AKI: creat peaked at 1.74. Now down to 1.15.  Cerebrovascular disease:MR brain showed a4 mm acute infarct left medial frontalcortex, which was felt to be incidental and procedural related. CTA of the head and neck show moderate 65 to 75% bilateral ICA stenoses but will require continued surveillance and outpatient  follow-up.  Orthostatic hypotension with recurrent syncope: all BP meds were held. Now back on Metoprolol 25 BID (home 25mg  TID) with rebound tachycardia. Wearing an abdominal binder and compression stockings. Continue doxazosin (old medication) as he has BPH with acute urinary retention. We have added midodrine 5mg  TID which has not seemed to make much of a difference. Still having symptomatic orthostatic hypotension with >40 mm Hg decrease in SBP with positional change. Will decrease Lopressor back to 12.5mg  BID. Start light fluids at 140mL/hr hour over 10 hours. Will check random cortisol and TSH ( add on to previous labs ) to rule out hypothyroid and adrenal insufficieny.   HTN: allowing for permissive supine hypertension given significant orthostasis. Currently on Lopressor 25mg  BID (added back for rebound sinus tach) and doxazosin. Will decrease Lopressor back to 12.5mg  BID given persistent orthostasis.  DMT2: continue SSI  Acute urinary retention with hematuria: had a temporoary coude cath placed. Plavix discontinued and hematuria improved. Now cath removed and pt able to void on his own. Continue on home doxazosin.   Abdominal pressure/pain: pt has a low grade temp up to 99.8 deg F, abdominal pressure and pain. Tender to palpation over mid abdomen. His white count is also noted to be elevated today at 14.7. No dysuria but chronic frequency (BPH). Also had nausea last night. No BM yesterday. Will check a UA to rule out UTI. Will discuss whether abdominal imaging is appropriate with Dr. Burt Knack.   RCC: pre TAVR CT showed a large RCC and pulmonary nodules. Follow up PET scan did not show evidence of metastatic disease. He has an apt with Dr. Tresa Moore with urology at the end of this month.  Pulmonary nodules: PET scan showed a dominant right lower lobe 1.2 cm solid pulmonary nodule is non-hypermetabolic. Additional small solid subcentimeter pulmonary nodules in both lungs are below PET resolution and  warrant attention on follow-up chest CT in 3 months. This will be followed over time.  SignedAngelena Form, PA-C  08/24/2019, 9:45 AM  Pager [phone]  Patient seen, examined. Available data reviewed.  Agree with findings, assessment, and plan as outlined by Nell Range, PA-C. On my exam, the patient is comfortably lying in bed, in NAD. Lungs CTA, heart RRR with 2/6 systolic murmur at the RUSB, abdomen soft, mild lower quadrant tenderness, legs without edema.  Continues to have orthostasis despite compression stockings and midodrine. Urinalysis reviewed and shows a UTI. Will start antibiotics while we await culture result. Updated patient's wife on his condition. CT abdomen/pelvis pending as above.   Sherren Mocha, M.D. 08/24/2019 3:58 PM

## 2019-08-24 NOTE — Progress Notes (Signed)
  [facility]   MULTIDISCIPLINARY HEART VALVE TEAM   Pt noted to have a UTI. Will start on Cipro 500mg  BID x 7 days.   Angelena Form PA-C  [facility]

## 2019-08-24 NOTE — Progress Notes (Signed)
Physical Therapy Treatment Patient Details Name: Alexis Griffith MRN: [id number] DOB: 01-Sep-1940 Today's Date: 08/24/2019    History of Present Illness 79 y.o. male with a history of HTN, HLD, DMT2, OSA, history of CVA, multivessel CAD, recently diagnosed renal cell carcinoma and severe AS who presented to [facility] on 08/15/19 for TAVR. Course complicated by AMS, BP and HR drop, imaging reveals 4 mm acute infarct L medial frontal cortex.    PT Comments    Pt OOB in recliner upon arrival of PT, initially hesitant to participate in therapy today reporting he did not feel up to mobility, but eventually agreeable. The pt was able to demo improvement in ambulation distance without need for seated rest today and without onset of orthostatic hypotension. The pt does still report some mild dizziness, but VSS through ambulation. The pt will continue to benefit from skilled PT to further progress functional strength, endurance, and dynamic stability needed to return to prior level of independence and mobility.     Follow Up Recommendations  Home health PT;Supervision/Assistance - 24 hour     Equipment Recommendations  None recommended by PT    Recommendations for Other Services       Precautions / Restrictions Precautions Precautions: Fall Precaution Comments: Watch BPs Restrictions Weight Bearing Restrictions: No    Mobility  Bed Mobility Overal bed mobility: Modified Independent             General bed mobility comments: pt OOB in recliner upon arrival of PT  Transfers Overall transfer level: Needs assistance Equipment used: Rolling walker (2 wheeled) Transfers: Sit to/from Stand Sit to Stand: Min guard         General transfer comment: pt able to power up without assist, cues given for hand positioning.  Ambulation/Gait Ambulation/Gait assistance: Min guard Gait Distance (Feet): 75 Feet (x 2) Assistive device: Rolling walker (2 wheeled) Gait Pattern/deviations: Step-through  pattern;Decreased stride length;Trunk flexed Gait velocity: 0.61m/s Gait velocity interpretation: <1.31 ft/sec, indicative of household ambulator General Gait Details: Min guard for safety, verbal cuing for upright posture. Pt conversant and pleasant throughout gait, no evidence of orthostatic hypotension today, pt able to complete ambulation with short standing break to take BP aft 75 ft.      Modified Rankin (Stroke Patients Only) Modified Rankin (Stroke Patients Only) Pre-Morbid Rankin Score: No significant disability Modified Rankin: Moderately severe disability     Balance Overall balance assessment: Needs assistance Sitting-balance support: Feet supported Sitting balance-Leahy Scale: Fair Sitting balance - Comments: supervision   Standing balance support: No upper extremity supported;During functional activity Standing balance-Leahy Scale: Fair Standing balance comment: use of RW dynamically                            Cognition Arousal/Alertness: Awake/alert Behavior During Therapy: Anxious Overall Cognitive Status: Within Functional Limits for tasks assessed Area of Impairment: Problem solving;Following commands;Safety/judgement                       Following Commands: Follows one step commands consistently;Follows multi-step commands with increased time Safety/Judgement: Decreased awareness of safety   Problem Solving: Slow processing;Requires verbal cues;Requires tactile cues;Difficulty sequencing;Decreased initiation General Comments: Pt with general anxieties with movement/feeling ill. Initially reporting he was unable to participate in therapy due to feeling unwell last night. Eventually agreeable to therapy, guarded with progressing ambulation.         General Comments General comments (skin integrity, edema, etc.):  no evidence of orthostatics today. VSS on RA      Pertinent Vitals/Pain Pain Assessment: No/denies pain Pain  Intervention(s): Monitored during session           PT Goals (current goals can now be found in the care plan section) Acute Rehab PT Goals Patient Stated Goal: go home PT Goal Formulation: With patient Time For Goal Achievement: 08/31/19 Potential to Achieve Goals: Good Progress towards PT goals: Progressing toward goals    Frequency    Min 3X/week      PT Plan Current plan remains appropriate       AM-PAC PT "6 Clicks" Mobility   Outcome Measure  Help needed turning from your back to your side while in a flat bed without using bedrails?: None Help needed moving from lying on your back to sitting on the side of a flat bed without using bedrails?: None Help needed moving to and from a bed to a chair (including a wheelchair)?: A Little Help needed standing up from a chair using your arms (e.g., wheelchair or bedside chair)?: A Little Help needed to walk in hospital room?: A Little Help needed climbing 3-5 steps with a railing? : A Little 6 Click Score: 20    End of Session Equipment Utilized During Treatment: Gait belt Activity Tolerance: Patient tolerated treatment well Patient left: with call bell/phone within reach;in chair;with chair alarm set Nurse Communication: Mobility status PT Visit Diagnosis: Other abnormalities of gait and mobility (R26.89);Muscle weakness (generalized) (M62.81);Other symptoms and signs involving the nervous system (R29.898)     Time: 1030-1105 PT Time Calculation (min) (ACUTE ONLY): 35 min  Charges:  $Gait Training: 23-37 mins                     Karma Ganja, PT, DPT   Acute Rehabilitation Department Pager #: [phone]   Otho Bellows 08/24/2019, 4:23 PM

## 2019-08-25 LAB — CBC
HCT: 35.5 % — ABNORMAL LOW (ref 39.0–52.0)
Hemoglobin: 11.8 g/dL — ABNORMAL LOW (ref 13.0–17.0)
MCH: 27.7 pg (ref 26.0–34.0)
MCHC: 33.2 g/dL (ref 30.0–36.0)
MCV: 83.3 fL (ref 80.0–100.0)
Platelets: 155 10*3/uL (ref 150–400)
RBC: 4.26 MIL/uL (ref 4.22–5.81)
RDW: 13 % (ref 11.5–15.5)
WBC: 10.5 10*3/uL (ref 4.0–10.5)
nRBC: 0 % (ref 0.0–0.2)

## 2019-08-25 LAB — BASIC METABOLIC PANEL
Anion gap: 12 (ref 5–15)
BUN: 18 mg/dL (ref 8–23)
CO2: 21 mmol/L — ABNORMAL LOW (ref 22–32)
Calcium: 8.6 mg/dL — ABNORMAL LOW (ref 8.9–10.3)
Chloride: 100 mmol/L (ref 98–111)
Creatinine, Ser: 0.99 mg/dL (ref 0.61–1.24)
GFR calc Af Amer: >60 mL/min (ref >60)
GFR calc non Af Amer: >60 mL/min (ref >60)
Glucose, Bld: 170 mg/dL — ABNORMAL HIGH (ref 70–99)
Potassium: 3.7 mmol/L (ref 3.5–5.1)
Sodium: 133 mmol/L — ABNORMAL LOW (ref 135–145)

## 2019-08-25 LAB — GLUCOSE, CAPILLARY
Glucose-Capillary: 159 mg/dL — ABNORMAL HIGH (ref 70–99)
Glucose-Capillary: 300 mg/dL — ABNORMAL HIGH (ref 70–99)
Glucose-Capillary: 166 mg/dL — ABNORMAL HIGH (ref 70–99)
Glucose-Capillary: 204 mg/dL — ABNORMAL HIGH (ref 70–99)

## 2019-08-25 MED ORDER — TAMSULOSIN HCL 0.4 MG PO CAPS
0.4000 mg | ORAL_CAPSULE | Freq: Every day | ORAL | Status: DC
  Administered 2019-08-25 – 2019-08-28 (×4): 0.4 mg via ORAL
  Filled 2019-08-25 (×4): qty 1

## 2019-08-25 NOTE — Progress Notes (Addendum)
[place] VALVE TEAM  Patient Name: Alexis Griffith Date of Encounter: 08/25/2019  Primary Cardiologist: Dr. Acie Fredrickson / Dr. Burt Knack & Dr. Cyndia Bent (TAVR)  Hospital Problem List     Principal Problem:   S/P TAVR (transcatheter aortic valve replacement) Active Problems:   PVC (premature ventricular contraction)   Hypertension   Hyperlipidemia   Carotid artery disease (HCC)   TIA (transient ischemic attack)   Coronary artery disease involving native coronary artery of native heart with angina pectoris ([place])   Acute on chronic diastolic heart failure ([place])   Diabetes mellitus without complication (HCC)   Severe aortic stenosis   Sleep apnea   Renal mass   Subjective   Doing okay today. No recurrent syncope over past couple days. Upset about having cath replaced but abdominal discomfort improved with bladder emptying.  Inpatient Medications    Scheduled Meds: . aspirin  81 mg Oral Daily  . Chlorhexidine Gluconate Cloth  6 each Topical Daily  . ciprofloxacin  500 mg Oral BID  . clonazePAM  0.5 mg Oral QID  . doxazosin  4 mg Oral QHS  . insulin aspart  0-24 Units Subcutaneous TID AC & HS  . insulin glargine  14 Units Subcutaneous Daily  . metoprolol tartrate  12.5 mg Oral BID  . midodrine  5 mg Oral TID WC  . polyethylene glycol  17 g Oral Daily  . rosuvastatin  5 mg Oral Daily  . sodium chloride flush  3 mL Intravenous Q12H   Continuous Infusions: . sodium chloride 20 mL/hr at 08/17/19 2131   PRN Meds: sodium chloride, acetaminophen **OR** acetaminophen, bismuth subsalicylate, morphine injection, ondansetron (ZOFRAN) IV, oxyCODONE, sodium chloride flush, traMADol, zolpidem   Vital Signs    Vitals:   08/25/19 0001 08/25/19 0628 08/25/19 0629 08/25/19 0833  BP: (!) 123/94  (!) 152/52 (!) 137/58  Pulse: 87 88 89 96  Resp: 19 (!) 21 16 19   Temp: 98.7 F (37.1 C) 98.6 F (37 C)  (!) 97.3 F (36.3 C)  TempSrc: Oral Oral  Oral    SpO2: 98% 93% 96% 95%  Weight:  86.8 kg    Height:        Intake/Output Summary (Last 24 hours) at 08/25/2019 1124 Last data filed at 08/25/2019 0500 Gross per 24 hour  Intake 480 ml  Output 3300 ml  Net -2820 ml   Filed Weights   08/23/19 0623 08/24/19 0500 08/25/19 0628  Weight: 86.9 kg 86.3 kg 86.8 kg    Physical Exam   GEN: Well nourished, well developed, in no acute distress. Up sitting in chair  Today eating breakfast HEENT: Grossly normal.  Neck: Supple, no JVD, carotid bruits, or masses. Cardiac: RRR, soft flow murmur. No rubs, or gallops. No clubbing, cyanosis, edema.  Wearing compression stockings Respiratory:  Respirations regular and unlabored, clear to auscultation bilaterally. GI: tender to palpation over mid belly bilaterally but L>R MS: no deformity or atrophy. Skin: warm and dry, no rash.  Groin sites healing well. small areas of ecchymosis without hematoma.  Neuro:  Strength and sensation are intact. Psych: AAOx3.  Normal affect.  Labs    CBC Recent Labs    08/24/19 0349 08/25/19 0429  WBC 14.7* 10.5  HGB 12.6* 11.8*  HCT 38.0* 35.5*  MCV 83.9 83.3  PLT 163 825   Basic Metabolic Panel Recent Labs    08/24/19 0349 08/25/19 0429  NA 134* 133*  K 4.2 3.7  CL  98 100  CO2 26 21*  GLUCOSE 170* 170*  BUN 23 18  CREATININE 1.15 0.99  CALCIUM 9.1 8.6*   Liver Function Tests No results for input(s): AST, ALT, ALKPHOS, BILITOT, PROT, ALBUMIN in the last 72 hours. No results for input(s): LIPASE, AMYLASE in the last 72 hours. Cardiac Enzymes No results for input(s): CKTOTAL, CKMB, CKMBINDEX, TROPONINI in the last 72 hours. BNP Invalid input(s): POCBNP D-Dimer No results for input(s): DDIMER in the last 72 hours. Hemoglobin A1C No results for input(s): HGBA1C in the last 72 hours. Fasting Lipid Panel No results for input(s): CHOL, HDL, LDLCALC, TRIG, CHOLHDL, LDLDIRECT in the last 72 hours. Thyroid Function Tests Recent Labs     08/24/19 1117  TSH 2.224    Telemetry    sinus - Personally Reviewed  ECG    Sinus with 1st deg AV block (PR 212ms) from 6/15 - Personally Reviewed  Radiology    CT ABDOMEN PELVIS W CONTRAST  Result Date: 08/24/2019 CLINICAL DATA:  79 year old male with abdominal pain, nausea vomiting. EXAM: CT ABDOMEN AND PELVIS WITH CONTRAST TECHNIQUE: Multidetector CT imaging of the abdomen and pelvis was performed using the standard protocol following bolus administration of intravenous contrast. CONTRAST:  168mL OMNIPAQUE IOHEXOL 300 MG/ML  SOLN COMPARISON:  None. FINDINGS: Lower chest: The visualized lung bases are clear. There is coronary vascular calcification. No intra-abdominal free air or free fluid. Hepatobiliary: The liver is unremarkable. No intrahepatic biliary duct dilatation. There is a small stone within gallbladder. No pericholecystic fluid or evidence of acute cholecystitis by CT. Pancreas: Unremarkable. No pancreatic ductal dilatation or surrounding inflammatory changes. Spleen: Normal in size without focal abnormality. Adrenals/Urinary Tract: The adrenal glands are unremarkable. There is a 5.3 x 8.7 x 7.0 cm heterogeneously enhancing, partially exophytic mass from the lateral interpolar left kidney with areas central necrosis most consistent with malignancy. Further characterization with renal mass protocol MRI is recommended. Several small cysts measure up to 5 cm in the upper pole of the left kidney. Subcentimeter right renal hypodense focus is too small to characterize. Several small nonobstructing bilateral renal calculi measure up to 3 mm in the upper pole of the left kidney. There is no hydronephrosis on either side. There is symmetric enhancement and excretion of contrast by both kidneys. The visualized ureters and urinary bladder appear unremarkable. Stomach/Bowel: There is sigmoid diverticulosis and scattered colonic diverticula without active inflammatory changes. There is no bowel  obstruction or active inflammation. Focal stranding adjacent to the left paracolic gutter likely reactive to left renal mass. The appendix is normal. Vascular/Lymphatic: Advanced aortoiliac atherosclerotic disease. The IVC is unremarkable. No portal venous gas. There is no adenopathy. Reproductive: The prostate gland is enlarged measuring 7 cm in transverse axial diameter. Other: Right inguinal subcutaneous stranding, possibly related to recent procedure. Clinical correlation is recommended. No large fluid collection or hematoma. Musculoskeletal: No acute osseous pathology. No suspicious osseous lesion. Bilateral L5 pars defects with grade 1 L5-S1 anterolisthesis. IMPRESSION: 1. Left renal interpolar mass most consistent with malignancy. Further characterization with renal mass protocol MRI is recommended. 2. Colonic diverticulosis. No bowel obstruction. Normal appendix. 3. Cholelithiasis. 4. Enlarged prostate gland. 5. Bilateral L5 pars defects with grade 1 L5-S1 anterolisthesis. 6. Aortic Atherosclerosis (ICD10-I70.0). Electronically Signed   By: Anner Crete M.D.   On: 08/24/2019 19:50    Cardiac Studies    TAVR OPERATIVE NOTE   Date of Procedure:  08/15/2019  Preoperative Diagnosis:      Severe Aortic Stenosis   Postoperative Diagnosis:    Same   Procedure:        Transcatheter Aortic Valve Replacement - Percutaneous Right Transfemoral Approach             Edwards Sapien 3 Ultra THV (size 26 mm, model # 9750TFX, serial # [id number])              Co-Surgeons:                        Gaye Pollack, MD and Sherren Mocha, MD    Anesthesiologist:                  Annye Asa, MD  Echocardiographer:              Bertrum Sol, MD  Pre-operative Echo Findings: ? Severe aortic stenosis ? Normal left ventricular systolic function  Post-operative Echo Findings: ? NO paravalvular leak ? Normal left ventricular systolic function   _________________   Echo  08/16/19:  IMPRESSIONS  1. Left ventricular ejection fraction, by estimation, is 55 to 60%. The  left ventricle has normal function. The left ventricle demonstrates  regional wall motion abnormalities (see scoring diagram/findings for  description). There is mild concentric left ventricular hypertrophy. Indeterminate diastolic filling due to E-A fusion.  2. Right ventricular systolic function is normal. The right ventricular  size is normal.  3. The mitral valve is normal in structure. No evidence of mitral valve  regurgitation. No evidence of mitral stenosis.  4. The aortic valve is normal in structure. Aortic valve regurgitation is  not visualized. No aortic stenosis is present. Aortic valve mean gradient  measures 11.7 mmHg. Aortic valve Vmax measures 2.48 m/s.  5. The inferior vena cava is normal in size with greater than 50%  respiratory variability, suggesting right atrial pressure of 3 mmHg.   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  No significant chronic ischemic change.   _______________  Ct angio head/neck 08/16/19 IMPRESSION: 1. Progressive cervical carotid atherosclerosis with 75% right and 65-70% left proximal ICA stenoses. 2. 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 3. New moderate right and mild left M1 stenoses. 4. Unchanged mild left intracranial ICA stenosis. 5. Aortic Atherosclerosis (ICD10-I70.0).   Patient Profile     Alexis Griffith is a 79 y.o. male with a history of HTN, HLD, DMT2, OSA, history of CVA, multivessel CAD, recently diagnosed renal cell carcinoma and severe AS who presented to [facility] on 08/15[facility]19 for planned TAVR.  Assessment & Plan    Severe AS: s/p successful TAVR with a 26 mm Edwards Sapien 3 THV via the TF approach on 08/15/19. Post operative echo showed EF 55%, normally functioning TAVR with a mean gradient of 11.7 mm hg  and no PVL. Groin sites are stable. ECG with sinus and 1st deg AV block (old) and no high grade heart block. Started on Asprin and Plavix (home Aggrenox discontinued). Plavix now discontinued with gross hematuria. Prolonged admission due to issues with acute urinary retention, hematuria and orthostatic hypotension with recurrent syncope.   Acute on chronic diastolic CHF: as evidenced by an elevated BNP on pre admission lab work. This has been treated with TAVR. Home diuretics on hold with orthostatic hypotension  AKI: creat peaked at 1.74. Now down to 0.99  Cerebrovascular disease:MR  brain showed a4 mm acute infarct left medial frontalcortex, which was felt to be incidental and procedural related. CTA of the head and neck show moderate 65 to 75% bilateral ICA stenoses but will require continued surveillance and outpatient follow-up.  Orthostatic hypotension with recurrent syncope: all BP meds were held. Now back on Metoprolol 12.5 BID (home 25mg  TID) as he developed rebound tachycardia. Wearing an abdominal binder and compression stockings. Continued on doxazosin (old medication) as he has BPH with acute urinary retention. Started on midodrine 5 mg TID with little improvement. I gave him light fluid hydration yesterday. Symptoms seem to be improved today.   HTN: allowing for permissive supine hypertension given significant orthostasis. Currently on Lopressor 12.5mg  BID (added back for rebound sinus tach) and doxazosin.   DMT2: continue SSI  Acute urinary retention with hematuria: had coude cath inserted then removed. Now coude cath replaced given recurrent acute urinary retention. Had gross hematuria that has resolved since discontinuing plavix. Continue on home doxazosin. Urology recommended starting tamsulosin 0.4 mg daily which I have added. Will need to see if he tolerates this with othostasis. Urology recommends leaving the catheter in for another 4 to 5 days.  Consider voiding trial at  follow-up with Dr. Tresa Moore.   UTI: no culture obtained. Started on Cipro and white count and fever have resolved. CT abdomen unremarkable. Suspect abdominal pain 2/2 UTI and acute urinary retention yesterday  RCC: pre TAVR CT showed a large RCC and pulmonary nodules. Follow up PET scan did not show evidence of metastatic disease. He has an apt with Dr. Tresa Moore with urology on 6/29  Pulmonary nodules: PET scan showed a dominant right lower lobe 1.2 cm solid pulmonary nodule is non-hypermetabolic. Additional small solid subcentimeter pulmonary nodules in both lungs are below PET resolution and warrant attention on follow-up chest CT in 3 months. This will be followed over time.  Disposition: OT has recommended SNF placement. Will reconsult PT for their recommendation and have care management to initiate authorization for SNF placement.   SignedAngelena Form, PA-C  08/25/2019, 11:24 AM  Pager [phone]  Patient seen, examined. Available data reviewed. Agree with findings, assessment, and plan as outlined by Nell Range, PA-C.  The patient is independently interviewed and evaluated this morning.  Unfortunately he had to have repeat placement of a catheter last night because of inability to void.  He is quite discouraged about his lack of progress.  He denies chest pain or shortness of breath.  On my exam he is alert, oriented, in no distress.  Lungs are clear, heart is regular rate and rhythm with a 2/6 systolic ejection murmur at the right upper sternal border, abdomen is soft and nontender, extremities have no edema.  Orthostatic vital signs have improved.  He is treated with midodrine.  He will need continued therapy as he has now been in the hospital for about 10 days and he has become deconditioned.  I think short-term skilled nursing will clearly be in his best interest and a consultation has been placed to the care management team.  Hopefully he will remain medically stable and we can aim to  get him discharged from the hospital at the beginning of the week.  Urology follow-up is arranged as above.  CT scan from yesterday reviewed.  Sherren Mocha, M.D. 08/25/2019 1:38 PM

## 2019-08-25 NOTE — Progress Notes (Signed)
Occupational Therapy Treatment Patient Details Name: Alexis Griffith MRN: [id number] DOB: 08-20-1940 Today's Date: 08/25/2019    History of present illness 79 y.o. male with a history of HTN, HLD, DMT2, OSA, history of CVA, multivessel CAD, recently diagnosed renal cell carcinoma and severe AS who presented to [facility] on 08/15/19 for TAVR. Course complicated by AMS, BP and HR drop, imaging reveals 4 mm acute infarct L medial frontal cortex.   OT comments  Pt assist to ambulate with RW to bathroom for toileting and standing grooming. No complaints of dizziness. Performed ADL with min guard assist. Pt is concerned about going home at his current functional level, does not think his wife can care for him. Updated d/c disposition to SNF.  Follow Up Recommendations  SNF;Supervision/Assistance - 24 hour    Equipment Recommendations  3 in 1 bedside commode    Recommendations for Other Services      Precautions / Restrictions Precautions Precautions: Fall Precaution Comments: Watch BPs Restrictions Weight Bearing Restrictions: No       Mobility Bed Mobility               General bed mobility comments: pt OOB in recliner upon arrival   Transfers Overall transfer level: Needs assistance Equipment used: Rolling walker (2 wheeled) Transfers: Sit to/from Stand Sit to Stand: Supervision;Min guard         General transfer comment: min guard from toilet, supervision from chair, good technique    Balance Overall balance assessment: Needs assistance   Sitting balance-Leahy Scale: Good     Standing balance support: No upper extremity supported Standing balance-Leahy Scale: Fair Standing balance comment: at sink, reliant on RW for ambulation                           ADL either performed or assessed with clinical judgement   ADL Overall ADL's : Needs assistance/impaired     Grooming: Oral care;Wash/dry hands;Standing;Min guard                   Toilet  Transfer: Min guard;Ambulation;Regular Toilet;Grab bars Toilet Transfer Details (indicate cue type and reason): ambulated to bathroom with RW Toileting- Clothing Manipulation and Hygiene: Set up;Sitting/lateral lean       Functional mobility during ADLs: Min guard;Rolling walker       Vision       Perception     Praxis      Cognition Arousal/Alertness: Awake/alert Behavior During Therapy: WFL for tasks assessed/performed Overall Cognitive Status: Within Functional Limits for tasks assessed                                          Exercises     Shoulder Instructions       General Comments      Pertinent Vitals/ Pain       Pain Assessment: No/denies pain  Home Living                                          Prior Functioning/Environment              Frequency  Min 2X/week        Progress Toward Goals  OT Goals(current goals can now be found in the care  plan section)  Progress towards OT goals: Progressing toward goals  Acute Rehab OT Goals Patient Stated Goal: to go to rehab prior to home OT Goal Formulation: With patient Time For Goal Achievement: 08/31/19 Potential to Achieve Goals: Good  Plan Discharge plan remains appropriate    Co-evaluation                 AM-PAC OT "6 Clicks" Daily Activity     Outcome Measure   Help from another person eating meals?: None Help from another person taking care of personal grooming?: A Little Help from another person toileting, which includes using toliet, bedpan, or urinal?: A Little Help from another person bathing (including washing, rinsing, drying)?: A Little Help from another person to put on and taking off regular upper body clothing?: None Help from another person to put on and taking off regular lower body clothing?: A Little 6 Click Score: 20    End of Session Equipment Utilized During Treatment: Gait belt;Rolling walker  OT Visit Diagnosis:  Unsteadiness on feet (R26.81);Other abnormalities of gait and mobility (R26.89)   Activity Tolerance Patient tolerated treatment well   Patient Left in chair;with call bell/phone within reach (with cardiac rehab)   Nurse Communication          Time: 1010-1045 OT Time Calculation (min): 35 min  Charges: OT General Charges $OT Visit: 1 Visit OT Treatments $Self Care/Home Management : 23-37 mins  Nestor Lewandowsky, OTR/L Acute Rehabilitation Services Pager: [phone] Office: [phone]   Malka So 08/25/2019, 11:07 AM

## 2019-08-25 NOTE — Progress Notes (Signed)
Mobility Specialist: Progress Note    08/25/19 1547  Mobility  Activity Ambulated in hall  Level of Assistance Minimal assist, patient does 75% or more  Assistive Device Front wheel walker  Distance Ambulated (ft) 230 ft  Mobility Response Tolerated well  Mobility performed by Mobility specialist  $Mobility charge 1 Mobility   Pre-Mobility:   Sitting on EOB: 88 HR, 148/54 BP, 99% SpO2  Standing: 101 HR, 132/59 BP, 95% SpO2 Post-Mobility: 99 HR, 132/59 BP, 95% SpO2  Pt said he felt tired but was willing to walk. Pt c/o of overall weakness.   [facility] Jonell Krontz Mobility Specialist

## 2019-08-25 NOTE — Progress Notes (Signed)
Physical Therapy Treatment Patient Details Name: Alexis Griffith MRN: [id number] DOB: 05-31-1940 Today's Date: 08/25/2019    History of Present Illness 79 y.o. male with a history of HTN, HLD, DMT2, OSA, history of CVA, multivessel CAD, recently diagnosed renal cell carcinoma and severe AS who presented to [facility] on 08/15/19 for TAVR. Course complicated by AMS, BP and HR drop, imaging reveals 4 mm acute infarct L medial frontal cortex.    PT Comments    Pt progressing towards goals. Continues to be limited by fatigue and weakness.. VSS throughout with no dizziness reported. Pt concerned about going home given current deficits, but hopeful he can continue to gain strength. Updated recommendations to SNF given current deficits. Will continue to follow acutely.     Follow Up Recommendations  SNF;Supervision/Assistance - 24 hour     Equipment Recommendations  None recommended by PT    Recommendations for Other Services       Precautions / Restrictions Precautions Precautions: Fall Precaution Comments: Watch BPs Restrictions Weight Bearing Restrictions: No    Mobility  Bed Mobility               General bed mobility comments: pt OOB in recliner upon arrival   Transfers Overall transfer level: Needs assistance Equipment used: Rolling walker (2 wheeled) Transfers: Sit to/from Stand Sit to Stand: Min assist         General transfer comment: Min A for steadying assist. Demonstrated safe hand placement.   Ambulation/Gait Ambulation/Gait assistance: Min guard Gait Distance (Feet): 75 Feet Assistive device: Rolling walker (2 wheeled) Gait Pattern/deviations: Step-through pattern;Decreased stride length;Trunk flexed     General Gait Details: Min guard for safety. VSS throughout. Distance limited secondary to fatigue.    Stairs             Wheelchair Mobility    Modified Rankin (Stroke Patients Only) Modified Rankin (Stroke Patients Only) Pre-Morbid Rankin  Score: No significant disability Modified Rankin: Moderately severe disability     Balance Overall balance assessment: Needs assistance Sitting-balance support: Feet supported Sitting balance-Leahy Scale: Good     Standing balance support: Bilateral upper extremity supported;During functional activity Standing balance-Leahy Scale: Poor Standing balance comment: Reliant on UE support                             Cognition Arousal/Alertness: Awake/alert Behavior During Therapy: WFL for tasks assessed/performed Overall Cognitive Status: Within Functional Limits for tasks assessed                                        Exercises      General Comments        Pertinent Vitals/Pain Pain Assessment: No/denies pain    Home Living                      Prior Function            PT Goals (current goals can now be found in the care plan section) Acute Rehab PT Goals Patient Stated Goal: to go to rehab prior to home PT Goal Formulation: With patient Time For Goal Achievement: 08/31/19 Potential to Achieve Goals: Good Progress towards PT goals: Progressing toward goals    Frequency    Min 3X/week      PT Plan Discharge plan needs to be updated  Co-evaluation              AM-PAC PT "6 Clicks" Mobility   Outcome Measure  Help needed turning from your back to your side while in a flat bed without using bedrails?: None Help needed moving from lying on your back to sitting on the side of a flat bed without using bedrails?: None Help needed moving to and from a bed to a chair (including a wheelchair)?: A Little Help needed standing up from a chair using your arms (e.g., wheelchair or bedside chair)?: A Little Help needed to walk in hospital room?: A Little Help needed climbing 3-5 steps with a railing? : A Lot 6 Click Score: 19    End of Session Equipment Utilized During Treatment: Gait belt Activity Tolerance: Patient  tolerated treatment well Patient left: with call bell/phone within reach (on commode; RN aware) Nurse Communication: Mobility status;Other (comment) (Pt on commode) PT Visit Diagnosis: Other abnormalities of gait and mobility (R26.89);Muscle weakness (generalized) (M62.81);Other symptoms and signs involving the nervous system (R29.898)     Time: 3785-8850 PT Time Calculation (min) (ACUTE ONLY): 16 min  Charges:  $Gait Training: 8-22 mins                     Lou Miner, DPT  Acute Rehabilitation Services  Pager: [phone] Office: [phone]    Alexis Griffith 08/25/2019, 2:34 PM

## 2019-08-25 NOTE — Progress Notes (Signed)
Dr. Junious Silk in and inserted [place] cath. Without any problems clr. Yellow urine obtain

## 2019-08-25 NOTE — Progress Notes (Signed)
CARDIAC REHAB PHASE I   PRE:  Rate/Rhythm: 71 1st Deg HB  BP:  Sitting: 126/56  Standing: 100/49     SaO2: 96 RA  MODE:  Ambulation: 230 ft   POST:  Rate/Rhythm: 101 ST  BP:  Sitting: 125/55    SaO2: 94 RA  Spoke with pt at length about possible rehab admission prior to going home. Pt open to the idea since neither he or his wife feel comfortable d/cing straight home at this point in time. Pt agreeable to SNF w/u, but remains hopeful that continued ambulation throughout the weekend and starting on antibiotic, that he will feel stronger by Monday. Pt ambulated 25ft in hallway assist of one with front wheel walker and gait belt. Pt states some head "fogginess" but stable throughout the session. Pt returned to recliner, call bell, bedside table and IS within reach. Will continue to follow.  [id number] Rufina Falco, RN BSN 08/25/2019 11:08 AM

## 2019-08-25 NOTE — Progress Notes (Signed)
Patient c/o of not being able to urinate and feels uncomfort. Bladder scan 745 ml R.N. aware Page Dr. Paticia Stack and he requested me to call urology. Dr. Junious Silk was call and made aware of events.at 20:20. Dr., Junious Silk will come and put foley in.

## 2019-08-26 DIAGNOSIS — R338 Other retention of urine: Secondary | ICD-10-CM

## 2019-08-26 DIAGNOSIS — E119 Type 2 diabetes mellitus without complications: Secondary | ICD-10-CM

## 2019-08-26 DIAGNOSIS — N2889 Other specified disorders of kidney and ureter: Secondary | ICD-10-CM

## 2019-08-26 LAB — GLUCOSE, CAPILLARY
Glucose-Capillary: 146 mg/dL — ABNORMAL HIGH (ref 70–99)
Glucose-Capillary: 191 mg/dL — ABNORMAL HIGH (ref 70–99)
Glucose-Capillary: 161 mg/dL — ABNORMAL HIGH (ref 70–99)
Glucose-Capillary: 147 mg/dL — ABNORMAL HIGH (ref 70–99)

## 2019-08-26 NOTE — NC FL2 (Signed)
[place] LEVEL OF CARE SCREENING TOOL     IDENTIFICATION  Patient Name: Alexis Griffith Birthdate: 05/09/1940 Sex: male Admission Date (Current Location): 08/15/2019  [facility] and [place] Number:  Herbalist and Address:  [facility], [place] [address], [place], [place] [address]      Provider Number: [id number]  Attending Physician Name and Address:  Sherren Mocha, MD  Relative Name and Phone Number:       Current Level of Care: Hospital Recommended Level of Care: [place] Prior Approval Number:    Date Approved/Denied:   PASRR Number: [id number]  Discharge Plan: SNF    Current Diagnoses: Patient Active Problem List   Diagnosis Date Noted  . Acute on chronic diastolic heart failure ([place]) 08/15/2019  . 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 Diabetes mellitus without complication ([place])   . Severe aortic stenosis   . Sleep apnea   . Renal mass   . Coronary artery disease involving native coronary artery of native heart with angina pectoris ([place]) 07/04/2019  . 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 Excessive urination at night 06/05/2013  . TIA (transient ischemic attack) 07/27/2012  . Giant cell arteritis ([place]) 05/17/2012  . Carotid artery disease ([place]) 08/24/2011  . PVC (premature ventricular contraction)   . Hypertension   . Hyperlipidemia   . Anxiety   . Sinus bradycardia on ECG     Orientation RESPIRATION BLADDER Height & Weight     Self, Time, Situation, Place  Normal Continent (urinary catheter) Weight: 192 lb 7.4 oz (87.3 kg) Height:  5\' 10"  (177.8 cm)  BEHAVIORAL SYMPTOMS/MOOD NEUROLOGICAL BOWEL NUTRITION STATUS      Continent Diet (please see discharge summary)  AMBULATORY STATUS COMMUNICATION OF NEEDS Skin   Limited Assist Verbally Surgical wounds (closed incision left and right groin)                       Personal Care Assistance Level of Assistance   Bathing, Feeding, Dressing Bathing Assistance: Limited assistance Feeding assistance: Independent Dressing Assistance: Limited assistance     Functional Limitations Info  Sight, Hearing, Speech Sight Info: Adequate Hearing Info: Adequate Speech Info: Adequate    SPECIAL CARE FACTORS FREQUENCY  PT (By licensed PT), OT (By licensed OT)     PT Frequency: 5x per week OT Frequency: 5x per week            Contractures Contractures Info: Not present    Additional Factors Info  Code Status, Allergies Code Status Info: FULL Allergies Info: Macrodantin ,Sulfa Antibiotics,Lisinopril,Penicillins           Current Medications (08/26/2019):  This is the current hospital active medication list Current Facility-Administered Medications  Medication Dose Route Frequency Provider Last Rate Last Admin  . 0.9 %  sodium chloride infusion  250 mL Intravenous PRN Eileen Stanford, PA-C 20 mL/hr at 08/17/19 2131 Restarted at 08/17/19 2131  . acetaminophen (TYLENOL) tablet 650 mg  650 mg Oral Q6H PRN Eileen Stanford, PA-C   650 mg at 08/23/19 2101   Or  . acetaminophen (TYLENOL) suppository 650 mg  650 mg Rectal Q6H PRN Eileen Stanford, PA-C      . aspirin chewable tablet 81 mg  81 mg Oral Daily Eileen Stanford, PA-C   81 mg at 08/26/19 0946  . bismuth subsalicylate (PEPTO BISMOL) 262 MG/15ML suspension 30 mL  30 mL Oral Q4H PRN Turner,  Eber Hong, MD   30 mL at 08/15/19 2338  . Chlorhexidine Gluconate Cloth 2 % PADS 6 each  6 each Topical Daily Sherren Mocha, MD   6 each at 08/26/19 [phone]  . ciprofloxacin (CIPRO) tablet 500 mg  500 mg Oral BID Eileen Stanford, PA-C   500 mg at 08/26/19 0831  . clonazePAM (KLONOPIN) tablet 0.5 mg  0.5 mg Oral QID Angelena Form R, PA-C   0.5 mg at 08/26/19 0947  . insulin aspart (novoLOG) injection 0-24 Units  0-24 Units Subcutaneous TID AC & HS Eileen Stanford, PA-C   2 Units at 08/26/19 7412  . insulin glargine (LANTUS) injection 14  Units  14 Units Subcutaneous Daily Sherren Mocha, MD   14 Units at 08/26/19 1009  . metoprolol tartrate (LOPRESSOR) tablet 12.5 mg  12.5 mg Oral BID Eileen Stanford, PA-C   12.5 mg at 08/26/19 0948  . midodrine (PROAMATINE) tablet 5 mg  5 mg Oral TID WC Angelena Form R, PA-C   5 mg at 08/26/19 0831  . morphine 2 MG/ML injection 1-4 mg  1-4 mg Intravenous Q1H PRN Eileen Stanford, PA-C   2 mg at 08/16/19 0221  . ondansetron (ZOFRAN) injection 4 mg  4 mg Intravenous Q6H PRN Eileen Stanford, PA-C   4 mg at 08/26/19 8786  . oxyCODONE (Oxy IR/ROXICODONE) immediate release tablet 5-10 mg  5-10 mg Oral Q3H PRN Eileen Stanford, PA-C   5 mg at 08/16/19 0557  . polyethylene glycol (MIRALAX / GLYCOLAX) packet 17 g  17 g Oral Daily Chriss Czar, MD   17 g at 08/25/19 0851  . rosuvastatin (CRESTOR) tablet 5 mg  5 mg Oral Daily Eileen Stanford, PA-C   5 mg at 08/26/19 0948  . sodium chloride flush (NS) 0.9 % injection 3 mL  3 mL Intravenous Q12H Eileen Stanford, PA-C   3 mL at 08/26/19 1009  . sodium chloride flush (NS) 0.9 % injection 3 mL  3 mL Intravenous PRN Angelena Form R, PA-C   3 mL at 08/25/19 0851  . tamsulosin (FLOMAX) capsule 0.4 mg  0.4 mg Oral QPC supper Eileen Stanford, PA-C   0.4 mg at 08/25/19 1803  . traMADol (ULTRAM) tablet 50-100 mg  50-100 mg Oral Q4H PRN Eileen Stanford, PA-C   100 mg at 08/23/19 1812  . zolpidem (AMBIEN) tablet 5 mg  5 mg Oral QHS PRN Eileen Stanford, PA-C   5 mg at 08/17/19 2107     Discharge Medications: Please see discharge summary for a list of discharge medications.  Relevant Imaging Results:  Relevant Lab Results:   Additional Information SSN [id number]  Vinie Sill, LCSWA

## 2019-08-26 NOTE — Progress Notes (Signed)
Mobility Specialist: Progress Note    08/26/19 1125  Mobility  Activity Ambulated in hall  Level of Assistance Contact guard assist, steadying assist  Assistive Device Front wheel walker  Distance Ambulated (ft) 250 ft  Mobility Response Tolerated well  Mobility performed by Mobility specialist  $Mobility charge 1 Mobility   Pre-Mobility: 83 HR, 151/60 BP, 96% SpO2 Post-Mobility: 87 HR, 135/59 BP, 95% SpO2  Pt briefly desat to 88% after ambulation, quickly resolved after sitting on EOB.   [facility] Elena Davia Mobility Specialist

## 2019-08-26 NOTE — Progress Notes (Signed)
Progress Note  Patient Name: Alexis Griffith Date of Encounter: 08/26/2019  [facility] Cardiologist: Mertie Moores, MD   Subjective   Sitting up in chair, looks comfortable, feels well. Still has a significant orthostatic drop in SBP (130 sitting - 107 standing) w compression stockings, abdominal binder, midodrine, but no symptoms of dizziness.  Inpatient Medications    Scheduled Meds: . aspirin  81 mg Oral Daily  . Chlorhexidine Gluconate Cloth  6 each Topical Daily  . ciprofloxacin  500 mg Oral BID  . clonazePAM  0.5 mg Oral QID  . insulin aspart  0-24 Units Subcutaneous TID AC & HS  . insulin glargine  14 Units Subcutaneous Daily  . metoprolol tartrate  12.5 mg Oral BID  . midodrine  5 mg Oral TID WC  . polyethylene glycol  17 g Oral Daily  . rosuvastatin  5 mg Oral Daily  . sodium chloride flush  3 mL Intravenous Q12H  . tamsulosin  0.4 mg Oral QPC supper   Continuous Infusions: . sodium chloride 20 mL/hr at 08/17/19 2131   PRN Meds: sodium chloride, acetaminophen **OR** acetaminophen, bismuth subsalicylate, morphine injection, ondansetron (ZOFRAN) IV, oxyCODONE, sodium chloride flush, traMADol, zolpidem   Vital Signs    Vitals:   08/26/19 0450 08/26/19 0656 08/26/19 0732 08/26/19 0847  BP: (!) 147/51   (!) 157/53  Pulse: 81   88  Resp: 20 (!) 22  20  Temp: 98.3 F (36.8 C)  98 F (36.7 C) 98 F (36.7 C)  TempSrc: Oral  Oral Oral  SpO2: 96%   92%  Weight:  87.3 kg    Height:        Intake/Output Summary (Last 24 hours) at 08/26/2019 1002 Last data filed at 08/26/2019 0900 Gross per 24 hour  Intake 720 ml  Output 2500 ml  Net -1780 ml   Last 3 Weights 08/26/2019 08/25/2019 08/25/2019  Weight (lbs) 192 lb 7.4 oz 191 lb 5.8 oz (No Data)  Weight (kg) 87.3 kg 86.8 kg (No Data)      Telemetry    NSR - Personally Reviewed  ECG    NSR, 1st deg AVB, LVH, Inferior Q waves, lateral ST-T changes - Personally Reviewed  Physical Exam  Appears well GEN:  No acute distress.   Neck: No JVD Cardiac: RRR, no murmurs, rubs, or gallops.  Respiratory: Clear to auscultation bilaterally. GI: Soft, nontender, non-distended  MS: No edema; No deformity. Neuro:  Nonfocal  Psych: Normal affect  Indwelling urinary catheter  Labs    High Sensitivity Troponin:  No results for input(s): TROPONINIHS in the last 720 hours.    Chemistry Recent Labs  Lab 08/23/19 0220 08/24/19 0349 08/25/19 0429  NA 136 134* 133*  K 4.0 4.2 3.7  CL 99 98 100  CO2 27 26 21*  GLUCOSE 151* 170* 170*  BUN 28* 23 18  CREATININE 1.18 1.15 0.99  CALCIUM 9.2 9.1 8.6*  GFRNONAA 59* >60 >60  GFRAA >60 >60 >60  ANIONGAP 10 10 12      Hematology Recent Labs  Lab 08/23/19 0220 08/24/19 0349 08/25/19 0429  WBC 8.0 14.7* 10.5  RBC 4.44 4.53 4.26  HGB 12.4* 12.6* 11.8*  HCT 37.4* 38.0* 35.5*  MCV 84.2 83.9 83.3  MCH 27.9 27.8 27.7  MCHC 33.2 33.2 33.2  RDW 12.9 12.8 13.0  PLT 168 163 155    BNPNo results for input(s): BNP, PROBNP in the last 168 hours.   DDimer No results for input(s):  DDIMER in the last 168 hours.   Radiology    CT ABDOMEN PELVIS W CONTRAST  Result Date: 08/24/2019 CLINICAL DATA:  79 year old male with abdominal pain, nausea vomiting. EXAM: CT ABDOMEN AND PELVIS WITH CONTRAST TECHNIQUE: Multidetector CT imaging of the abdomen and pelvis was performed using the standard protocol following bolus administration of intravenous contrast. CONTRAST:  176mL OMNIPAQUE IOHEXOL 300 MG/ML  SOLN COMPARISON:  None. FINDINGS: Lower chest: The visualized lung bases are clear. There is coronary vascular calcification. No intra-abdominal free air or free fluid. Hepatobiliary: The liver is unremarkable. No intrahepatic biliary duct dilatation. There is a small stone within gallbladder. No pericholecystic fluid or evidence of acute cholecystitis by CT. Pancreas: Unremarkable. No pancreatic ductal dilatation or surrounding inflammatory changes. Spleen: Normal in size  without focal abnormality. Adrenals/Urinary Tract: The adrenal glands are unremarkable. There is a 5.3 x 8.7 x 7.0 cm heterogeneously enhancing, partially exophytic mass from the lateral interpolar left kidney with areas central necrosis most consistent with malignancy. Further characterization with renal mass protocol MRI is recommended. Several small cysts measure up to 5 cm in the upper pole of the left kidney. Subcentimeter right renal hypodense focus is too small to characterize. Several small nonobstructing bilateral renal calculi measure up to 3 mm in the upper pole of the left kidney. There is no hydronephrosis on either side. There is symmetric enhancement and excretion of contrast by both kidneys. The visualized ureters and urinary bladder appear unremarkable. Stomach/Bowel: There is sigmoid diverticulosis and scattered colonic diverticula without active inflammatory changes. There is no bowel obstruction or active inflammation. Focal stranding adjacent to the left paracolic gutter likely reactive to left renal mass. The appendix is normal. Vascular/Lymphatic: Advanced aortoiliac atherosclerotic disease. The IVC is unremarkable. No portal venous gas. There is no adenopathy. Reproductive: The prostate gland is enlarged measuring 7 cm in transverse axial diameter. Other: Right inguinal subcutaneous stranding, possibly related to recent procedure. Clinical correlation is recommended. No large fluid collection or hematoma. Musculoskeletal: No acute osseous pathology. No suspicious osseous lesion. Bilateral L5 pars defects with grade 1 L5-S1 anterolisthesis. IMPRESSION: 1. Left renal interpolar mass most consistent with malignancy. Further characterization with renal mass protocol MRI is recommended. 2. Colonic diverticulosis. No bowel obstruction. Normal appendix. 3. Cholelithiasis. 4. Enlarged prostate gland. 5. Bilateral L5 pars defects with grade 1 L5-S1 anterolisthesis. 6. Aortic Atherosclerosis  (ICD10-I70.0). Electronically Signed   By: Anner Crete M.D.   On: 08/24/2019 19:50    Cardiac Studies    TAVR OPERATIVE NOTE   Date of Procedure:08/15/2019  Preoperative Diagnosis:Severe Aortic Stenosis   Postoperative Diagnosis:Same   Procedure:   Transcatheter Aortic Valve Replacement - PercutaneousRightTransfemoral Approach Edwards Sapien 3 Ultra THV (size 9mm, model # [id number], serial # [id number])  Co-Surgeons:Bryan Alveria Apley, MD and Sherren Mocha, MD   Anesthesiologist:Carswell Glennon Mac, MD  Echocardiographer:M. Tiaira Arambula, MD  Pre-operative Echo Findings: ? Severe aortic stenosis ? Normalleft ventricular systolic function  Post-operative Echo Findings: ? NOparavalvular leak ? Normalleft ventricular systolic function   _________________   Echo 08/16/19:  IMPRESSIONS  1. Left ventricular ejection fraction, by estimation, is 55 to 60%. The  left ventricle has normal function. The left ventricle demonstrates  regional wall motion abnormalities (see scoring diagram/findings for  description). There is mild concentric left ventricular hypertrophy. Indeterminate diastolic filling due to E-A fusion.  2. Right ventricular systolic function is normal. The right ventricular  size is normal.  3. The mitral valve is normal in structure. No evidence of mitral valve  regurgitation. No evidence of mitral stenosis.  4. The aortic valve is normal in structure. Aortic valve regurgitation is  not visualized. No aortic stenosis is present. Aortic valve mean gradient  measures 11.7 mmHg. Aortic valve Vmax measures 2.48 m/s.  5. The inferior vena cava is normal in size with greater than 50%  respiratory variability, suggesting right atrial pressure of 3 mmHg.   _______________  MR HEAD WO contrast 08/16/19 IMPRESSION: 4 mm acute infarct  left medial frontal cortex  Generalized atrophy. No significant chronic ischemic change.   _______________  Ct angio head/neck 08/16/19 IMPRESSION: 1. Progressive cervical carotid atherosclerosis with 75% right and 65-70% left proximal ICA stenoses. 2. 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 3. New moderate right and mild left M1 stenoses. 4. Unchanged mild left intracranial ICA stenosis. 5. Aortic Atherosclerosis (ICD10-I70.0).  Patient Profile     79 y.o. male with a history of HTN, HLD, DMT2, OSA, history of CVA, multivessel CAD, recently diagnosed renal cell carcinoma and severe AS who presented to [facility] on [facility]19 for planned TAVR.  Assessment & Plan    1. Severe AS:s/p successful TAVR with a 26 mm Edwards Sapien 3 THV via the TF approach on 08/15/19. Plavix now discontinued with gross hematuria. Prolonged admission due to issues with acute urinary retention, hematuria and orthostatic hypotension with recurrent syncope. Will need SNF.  2. Acute on chronic diastolic CHF: appears euvolemic after TAVR, even though diuretics were stopped due to orthostatic hypotension.  3. AKI: creat peaked at 1.74. Now resolved.  4. Cerebrovascular disease:Asymptomatic at this time. MR brain showed a4 mm acute infarct left medial frontalcortex, which was felt to be [place] procedural related.CTA of the head and neck show moderate 65 to 75% bilateral ICA stenoses but will require continued surveillance and outpatient follow-up.  5. Orthostatic hypotension with recurrent syncope: all BP meds were held. Now back on Metoprolol 12.5 BID (home 25mg  TID) as he developed rebound tachycardia. Doxazosin changed to tamsulosin. Started on midodrine 5 mg TID with little improvement. I gave him light fluid hydration yesterday. Symptoms resolved, although he still has a > 20 mm Hg drop in SBP today  6. HTN: tolerate SBP up to 160, at least for  Now; allowing for  permissive supine hypertension given significant orthostasis. Currently on Lopressor 12.5mg  BID (added back for rebound sinus tach) and doxazosin.   7. DMT2: continue SSI  8. Acute urinary retention with hematuria: had coude cath inserted then removed. Now coude cath replaced given recurrent acute urinary retention. Had gross hematuria that has resolved since discontinuing plavix. Continue on home doxazosin. Urology recommended starting tamsulosin 0.4 mg daily which I have added. Will need to see if he tolerates this with othostasis. Urology recommends leaving the catheter in for another 4 to 5 days. Consider voiding trial at follow-up with Dr. Tresa Moore.   UTI: no culture obtained. Started on Cipro and white count and fever have resolved. CT abdomen unremarkable. Suspect abdominal pain 2/2 UTI and acute urinary retention yesterday  RCC: pre TAVR CT showed a large RCC and pulmonary nodules. Follow up PET scan did not show evidence of metastatic disease. He has an apt with Dr. Tresa Moore with urology on 6/29 which will need to be rescheduled.  Pulmonary nodules: PET scan showed a dominant right lower lobe 1.2 cm solid pulmonary nodule is non-hypermetabolic. Additional small solid subcentimeter pulmonary nodules in both lungs are below PET resolution and warrant attention on follow-up chest CT in 3 months.  This will be followed over time.  For questions or updates, please contact [place] Please consult [email or web address] for contact info under        Signed, Sanda Klein, MD  08/26/2019, 10:02 AM

## 2019-08-26 NOTE — TOC Initial Note (Signed)
Transition of Care [facility]) - Initial/Assessment Note    Patient Details  Name: Alexis Griffith MRN: [id number] Date of Birth: 02/23/1941  [facility] Care [facility]) CM/SW Contact:    Alexis Griffith, [place] Phone Number: 08/26/2019, 12:50 PM  Clinical Narrative:                  CSW visit patient at bedside. CSW introduced self and explained role. CSW discussed with patient PT recommendation of short term rehab at [facility] before discharging home. Patient states his wife is unable to provide level of care that is needed at this time. Patient agreeable to short term rehab at [facility]. Patent states he has never been to SNF rehab before and has no preferred SNF. CSW explained the SNF process. Patient states he received covid vaccines. CSW given permission to send SNF referrals.  CSW will provide bed offers once available.   Alexis Griffith, MSW, LCSWA Clinical Social Worker    Barriers to Discharge: Ship broker, Continued Medical Work up, SNF Pending bed offer   Patient Goals and CMS Choice        Expected Discharge Plan and Services   In-house Referral: Clinical Social Work     Living arrangements for the past 2 months: Single Family Home                                      Prior Living Arrangements/Services Living arrangements for the past 2 months: Single Family Home Lives with:: Self, Spouse Patient language and need for interpreter reviewed:: No        Need for Family Participation in Patient Care: Yes (Comment) Care giver support system in place?: Yes (comment)   Criminal Activity/Legal Involvement Pertinent to Current Situation/Hospitalization: No - Comment as needed  Activities of Daily Living Home Assistive Devices/Equipment: CBG Meter, Blood pressure cuff ADL Screening (condition at time of admission) Patient's cognitive ability adequate to safely complete daily activities?: Yes Is the patient deaf or have difficulty hearing?: No Does the patient  have difficulty seeing, even when wearing glasses/contacts?: No Does the patient have difficulty concentrating, remembering, or making decisions?: No Patient able to express need for assistance with ADLs?: Yes Does the patient have difficulty dressing or bathing?: No Independently performs ADLs?: Yes (appropriate for developmental age) Does the patient have difficulty walking or climbing stairs?: No Weakness of Legs: None Weakness of Arms/Hands: None  Permission Sought/Granted Permission sought to share information with : Family Supports Permission granted to share information with : Yes, Verbal Permission Granted  Share Information with NAME: Alexis Griffith  Permission granted to share info w AGENCY: SNFs  Permission granted to share info w Relationship: spouse  Permission granted to share info w Contact Information: [phone]  Emotional Assessment   Attitude/Demeanor/Rapport: Engaged Affect (typically observed): Accepting, Pleasant, Appropriate Orientation: : Oriented to Self, Oriented to Place, Oriented to  Time, Oriented to Situation Alcohol / Substance Use: Not Applicable Psych Involvement: No (comment)  Admission diagnosis:  S/P TAVR (transcatheter aortic valve replacement) [Z95.2] Patient Active Problem List   Diagnosis Date Noted  . Acute on chronic diastolic heart failure ([place]) 08/15/2019  . 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 Diabetes mellitus without complication ([place])   . Severe aortic stenosis   . Sleep apnea   . Renal mass   . Coronary artery disease involving native coronary artery of native heart with angina pectoris (  [place]) 07/04/2019  . 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 Excessive urination at night 06/05/2013  . TIA (transient ischemic attack) 07/27/2012  . Giant cell arteritis ([place]) 05/17/2012  . Carotid artery disease ([place]) 08/24/2011  . PVC (premature ventricular contraction)   . Hypertension   . Hyperlipidemia   . Anxiety    . Sinus bradycardia on ECG    PCP:  Tisovec, Fransico Him, MD Pharmacy:   [facility] [place] [address] Phone: [phone] Fax: [phone]     Social Determinants of Health (SDOH) Interventions    Readmission Risk Interventions No flowsheet data found.

## 2019-08-26 NOTE — Progress Notes (Signed)
CARDIAC REHAB PHASE I   PRE:  Rate/Rhythm: 85 SR PVCs  BP:  Supine: 158/66  Sitting: 154/61  Standing: 107/53   SaO2: 98%RA  MODE:  Ambulation: 240 ft   POST:  Rate/Rhythm: 116 ST  BP:  Supine:   Sitting: 130/64  Standing:    SaO2: 91%RA 0729-0800 Pt still orthostatic but no dizziness with standing or walking. Pt walked 240 ft on RA with gait belt use and rolling walker. To recliner with call bell after walk. Pt needed a little assistance to get OOB but able to stand by himself. Very motivated to walk. Set up breakfast for pt. Will walk later with Mobility Specialist.   Graylon Good, RN BSN  08/26/2019 8:25 AM

## 2019-08-27 LAB — GLUCOSE, CAPILLARY
Glucose-Capillary: 159 mg/dL — ABNORMAL HIGH (ref 70–99)
Glucose-Capillary: 209 mg/dL — ABNORMAL HIGH (ref 70–99)
Glucose-Capillary: 165 mg/dL — ABNORMAL HIGH (ref 70–99)
Glucose-Capillary: 213 mg/dL — ABNORMAL HIGH (ref 70–99)
Glucose-Capillary: 209 mg/dL — ABNORMAL HIGH (ref 70–99)

## 2019-08-27 NOTE — TOC Progression Note (Signed)
Transition of Care [facility]) - Progression Note    Patient Details  Name: Alexis Griffith MRN: [id number] Date of Birth: 14-Aug-1940  [facility] CM/SW [place], [place] Phone Number: 08/27/2019, 1:10 PM  Clinical Narrative:     CSW gave patient bed offers ([email or web address] SNF lisiting) with star ratings.  CSW will follow up tomorrow for SNF choice.  Thurmond Butts, MSW, LCSWA Clinical Social Worker     Barriers to Discharge: Ship broker, Continued Medical Work up, SNF Pending bed offer  Expected Discharge Plan and Services   In-house Referral: Clinical Social Work     Living arrangements for the past 2 months: Single Family Home                                       Social Determinants of Health (SDOH) Interventions    Readmission Risk Interventions No flowsheet data found.

## 2019-08-27 NOTE — Progress Notes (Signed)
Mobility Specialist: Progress Note    08/27/19 1240  Mobility  Activity Ambulated in hall  Level of Assistance Modified independent, requires aide device or extra time  Assistive Device Front wheel walker  Distance Ambulated (ft) 270 ft  Mobility Response Tolerated well  Mobility performed by Mobility specialist  Bed Position Chair  $Mobility charge 1 Mobility   Pre-Mobility: 75 HR, 133/60 BP, 96% SpO2 Post-Mobility: 87 HR, 157/65 BP, 98% SpO2   Psychologist, clinical

## 2019-08-27 NOTE — Progress Notes (Signed)
Progress Note  Patient Name: Alexis Griffith Date of Encounter: 08/27/2019  [facility] Cardiologist: Mertie Moores, MD   Subjective   Overall doing well. No orthostatic dizziness. Not wearing the abdominal binder, but has compression stockings - no change in BP with standing (after 5 minutes 139/65), although HR does increase to almost 120. Moderate intermittent hematuria seen.  Inpatient Medications    Scheduled Meds: . aspirin  81 mg Oral Daily  . Chlorhexidine Gluconate Cloth  6 each Topical Daily  . ciprofloxacin  500 mg Oral BID  . clonazePAM  0.5 mg Oral QID  . insulin aspart  0-24 Units Subcutaneous TID AC & HS  . insulin glargine  14 Units Subcutaneous Daily  . metoprolol tartrate  12.5 mg Oral BID  . midodrine  5 mg Oral TID WC  . polyethylene glycol  17 g Oral Daily  . rosuvastatin  5 mg Oral Daily  . sodium chloride flush  3 mL Intravenous Q12H  . tamsulosin  0.4 mg Oral QPC supper   Continuous Infusions: . sodium chloride 20 mL/hr at 08/17/19 2131   PRN Meds: sodium chloride, acetaminophen **OR** acetaminophen, bismuth subsalicylate, morphine injection, ondansetron (ZOFRAN) IV, oxyCODONE, sodium chloride flush, traMADol, zolpidem   Vital Signs    Vitals:   08/27/19 0417 08/27/19 0500 08/27/19 0700 08/27/19 0744  BP: (!) 154/55 (!) 149/59  (!) 146/58  Pulse: 77 74 75 76  Resp: 18 19 17 18   Temp: 97.9 F (36.6 C)   98.1 F (36.7 C)  TempSrc: Axillary   Oral  SpO2: 97% 95% 95% 96%  Weight:   86.1 kg   Height:        Intake/Output Summary (Last 24 hours) at 08/27/2019 0950 Last data filed at 08/27/2019 0701 Gross per 24 hour  Intake 960 ml  Output 5670 ml  Net -4710 ml   Last 3 Weights 08/27/2019 08/26/2019 08/25/2019  Weight (lbs) 189 lb 14.4 oz 192 lb 7.4 oz 191 lb 5.8 oz  Weight (kg) 86.138 kg 87.3 kg 86.8 kg      Telemetry    NSR, occasional bigeminal PVCs - Personally Reviewed  ECG    No new tracing - Personally Reviewed  Physical  Exam  Appears well, comfortable when standing for several minutes GEN: No acute distress.   Neck: No JVD Cardiac: RRR, no murmurs, rubs, or gallops.  Respiratory: Clear to auscultation bilaterally. GI: Soft, nontender, non-distended  MS: No edema; No deformity. Neuro:  Nonfocal  Psych: Normal affect   Labs    High Sensitivity Troponin:  No results for input(s): TROPONINIHS in the last 720 hours.    Chemistry Recent Labs  Lab 08/23/19 0220 08/24/19 0349 08/25/19 0429  NA 136 134* 133*  K 4.0 4.2 3.7  CL 99 98 100  CO2 27 26 21*  GLUCOSE 151* 170* 170*  BUN 28* 23 18  CREATININE 1.18 1.15 0.99  CALCIUM 9.2 9.1 8.6*  GFRNONAA 59* >60 >60  GFRAA >60 >60 >60  ANIONGAP 10 10 12      Hematology Recent Labs  Lab 08/23/19 0220 08/24/19 0349 08/25/19 0429  WBC 8.0 14.7* 10.5  RBC 4.44 4.53 4.26  HGB 12.4* 12.6* 11.8*  HCT 37.4* 38.0* 35.5*  MCV 84.2 83.9 83.3  MCH 27.9 27.8 27.7  MCHC 33.2 33.2 33.2  RDW 12.9 12.8 13.0  PLT 168 163 155    BNPNo results for input(s): BNP, PROBNP in the last 168 hours.   DDimer No results  for input(s): DDIMER in the last 168 hours.   Radiology    No results found.  Cardiac Studies   Echo 08/16/19:  IMPRESSIONS  1. Left ventricular ejection fraction, by estimation, is 55 to 60%. The  left ventricle has normal function. The left ventricle demonstrates  regional wall motion abnormalities (see scoring diagram/findings for  description). There is mild concentric left ventricular hypertrophy. Indeterminate diastolic filling due to E-A fusion.  2. Right ventricular systolic function is normal. The right ventricular  size is normal.  3. The mitral valve is normal in structure. No evidence of mitral valve  regurgitation. No evidence of mitral stenosis.  4. The aortic valve is normal in structure. Aortic valve regurgitation is  not visualized. No aortic stenosis is present. Aortic valve mean gradient  measures 11.7 mmHg. Aortic  valve Vmax measures 2.48 m/s.  5. The inferior vena cava is normal in size with greater than 50%  respiratory variability, suggesting right atrial pressure of 3 mmHg.    Patient Profile     79 y.o. male with a history of HTN, HLD, DMT2, OSA, history of CVA, multivessel CAD, recently diagnosed renal cell carcinoma and severe AS who presented to [facility] on 08/15/19 for planned TAVR.  Assessment & Plan    1. Severe AS:s/p successful TAVR with a 26 mm Edwards Sapien 3 THV via the TF approach on 08/15/19. Plavix now discontinued with gross hematuria. Prolonged admission due to issues with acute urinary retention, hematuria and orthostatic hypotension with recurrent syncope. Awaiting SNF.  2. Acute on chronic diastolic CHF: appears euvolemic after TAVR, excellent UO even though diuretics were stopped due to orthostatic hypotension.  3. AKI: creat peaked at 1.74. Now resolved. Recheck labs in AM.  4. Cerebrovascular disease:Asymptomatic at this time. MR brain showed a4 mm acute infarct left medial frontalcortex, which was felt to be incidentaland procedural related.CTA of the head and neck show moderate 65 to 75% bilateral ICA stenoses but will require continued surveillance and outpatient follow-up.  5. Orthostatic hypotension with recurrent syncope: all BP meds were held (except very low dose metoprolol). Still develops orthostatic sinus tachycardia, but BP no longer falls when standing. Does not seem to need the binder. Consider trying to wean down the midodrine tomorrow.  6. HTN: tolerate SBP up to 160, at least for now; allowing for permissive supine hypertension given significant orthostasis.   7. DMT2: continue SSI  8. Acute urinary retention with hematuria:had coude cath inserted then removed. Now coude cath replaced given recurrent acute urinary retention. Had gross hematuria that has resolved since discontinuing plavix.Continue on home doxazosin.Urology recommendsleavingthe  catheter infor another 4 to 5 days (through Tuesday-Wednesday of next week). Consider voiding trial at follow-up with Dr. Tresa Moore. This was scheduled for this Tuesday 06/29. Please call Urology office tomorrow to help him reschedule that appt (his wife has not been able to get through).  UTI:no culture obtained. Started on Cipro and white count and fever have resolved. CT abdomen unremarkable. Suspect abdominal pain 2/2 UTI and acute urinary retention yesterday  RCC: pre TAVR CT showed a large RCC and pulmonary nodules. Follow up PET scan did not show evidence of metastatic disease. He has an apt with Dr. Tresa Moore with urologyon 6/29 which will need to be rescheduled.  Pulmonary nodules: PET scan showed a dominant right lower lobe 1.2 cm solid pulmonary nodule is non-hypermetabolic. Additional small solid subcentimeter pulmonary nodules in both lungs are below PET resolution and warrant attention on follow-up chest CT in  3 months. This will be followed over time.  For questions or updates, please contact [place] Please consult [email or web address] for contact info under        Signed, Sanda Klein, MD  08/27/2019, 9:50 AM

## 2019-08-27 NOTE — Plan of Care (Signed)
  Problem: Health Behavior/Discharge Planning: Goal: Ability to manage health-related needs will improve Outcome: Progressing   Problem: Clinical Measurements: Goal: Ability to maintain clinical measurements within normal limits will improve Outcome: Progressing Goal: Will remain free from infection Outcome: Progressing Goal: Diagnostic test results will improve Outcome: Progressing Goal: Respiratory complications will improve Outcome: Progressing Goal: Cardiovascular complication will be avoided Outcome: Progressing   Problem: Activity: Goal: Risk for activity intolerance will decrease Outcome: Progressing   Problem: Coping: Goal: Level of anxiety will decrease Outcome: Progressing   Problem: Pain Managment: Goal: General experience of comfort will improve Outcome: Progressing   Problem: Skin Integrity: Goal: Risk for impaired skin integrity will decrease Outcome: Progressing   Problem: Safety: Goal: Ability to remain free from injury will improve Outcome: Progressing

## 2019-08-28 LAB — BASIC METABOLIC PANEL
Anion gap: 10 (ref 5–15)
BUN: 9 mg/dL (ref 8–23)
CO2: 23 mmol/L (ref 22–32)
Calcium: 9 mg/dL (ref 8.9–10.3)
Chloride: 99 mmol/L (ref 98–111)
Creatinine, Ser: 0.87 mg/dL (ref 0.61–1.24)
GFR calc Af Amer: >60 mL/min (ref >60)
GFR calc non Af Amer: >60 mL/min (ref >60)
Glucose, Bld: 224 mg/dL — ABNORMAL HIGH (ref 70–99)
Potassium: 3.6 mmol/L (ref 3.5–5.1)
Sodium: 132 mmol/L — ABNORMAL LOW (ref 135–145)

## 2019-08-28 LAB — GLUCOSE, CAPILLARY
Glucose-Capillary: 153 mg/dL — ABNORMAL HIGH (ref 70–99)
Glucose-Capillary: 239 mg/dL — ABNORMAL HIGH (ref 70–99)
Glucose-Capillary: 241 mg/dL — ABNORMAL HIGH (ref 70–99)

## 2019-08-28 LAB — CBC
HCT: 38.2 % — ABNORMAL LOW (ref 39.0–52.0)
Hemoglobin: 12.7 g/dL — ABNORMAL LOW (ref 13.0–17.0)
MCH: 27.1 pg (ref 26.0–34.0)
MCHC: 33.2 g/dL (ref 30.0–36.0)
MCV: 81.6 fL (ref 80.0–100.0)
Platelets: 210 10*3/uL (ref 150–400)
RBC: 4.68 MIL/uL (ref 4.22–5.81)
RDW: 12.8 % (ref 11.5–15.5)
WBC: 7.9 10*3/uL (ref 4.0–10.5)
nRBC: 0 % (ref 0.0–0.2)

## 2019-08-28 LAB — SARS CORONAVIRUS 2 (TAT 6-24 HRS): SARS Coronavirus 2: NEGATIVE

## 2019-08-28 MED ORDER — GLYCERIN (LAXATIVE) 2.1 G RE SUPP
1.0000 | RECTAL | Status: DC | PRN
  Administered 2019-08-28: 1 via RECTAL
  Filled 2019-08-28 (×2): qty 1

## 2019-08-28 MED ORDER — INSULIN GLARGINE 100 UNIT/ML ~~LOC~~ SOLN
18.0000 [IU] | Freq: Every day | SUBCUTANEOUS | Status: DC
  Administered 2019-08-29 – 2019-08-30 (×2): 18 [IU] via SUBCUTANEOUS
  Filled 2019-08-28 (×2): qty 0.18

## 2019-08-28 MED ORDER — INSULIN ASPART 100 UNIT/ML ~~LOC~~ SOLN
5.0000 [IU] | Freq: Two times a day (BID) | SUBCUTANEOUS | Status: DC
  Administered 2019-08-28 – 2019-08-29 (×3): 5 [IU] via SUBCUTANEOUS

## 2019-08-28 NOTE — TOC Progression Note (Signed)
Transition of Care [facility]) - Progression Note    Patient Details  Name: DOMINGOS RIGGI MRN: [id number] Date of Birth: 17-Feb-1941  Transition of Care [facility]) CM/SW [place], [place] Phone Number: 08/28/2019, 3:21 PM  Clinical Narrative:     Insurance authorization pending- Leisure centre manager [id number]. covid test pending  Thurmond Butts, MSW, LCSWA Clinical Social Worker     Barriers to Discharge: Ship broker, Continued Medical Work up, SNF Pending bed offer  Expected Discharge Plan and Services   In-house Referral: Clinical Social Work     Living arrangements for the past 2 months: Single Family Home                                       Social Determinants of Health (SDOH) Interventions    Readmission Risk Interventions No flowsheet data found.

## 2019-08-28 NOTE — Progress Notes (Addendum)
Progress Note  Patient Name: Alexis Griffith Date of Encounter: 08/28/2019  [facility] Cardiologist: Mertie Moores, MD / Dr. Burt Knack & Dr. Cyndia Bent (TAVR)  Subjective   Patient was feeling wonderful this AM but now "feels horrible". Does not want catheter in but having some abdominal bloating which he thinks is from constipation. Stood up and walked and HR went into 130s. Has a headache  Inpatient Medications    Scheduled Meds: . aspirin  81 mg Oral Daily  . Chlorhexidine Gluconate Cloth  6 each Topical Daily  . ciprofloxacin  500 mg Oral BID  . clonazePAM  0.5 mg Oral QID  . insulin aspart  0-24 Units Subcutaneous TID AC & HS  . insulin glargine  14 Units Subcutaneous Daily  . metoprolol tartrate  12.5 mg Oral BID  . midodrine  5 mg Oral TID WC  . polyethylene glycol  17 g Oral Daily  . rosuvastatin  5 mg Oral Daily  . sodium chloride flush  3 mL Intravenous Q12H  . tamsulosin  0.4 mg Oral QPC supper   Continuous Infusions: . sodium chloride 20 mL/hr at 08/17/19 2131   PRN Meds: sodium chloride, acetaminophen **OR** acetaminophen, bismuth subsalicylate, morphine injection, ondansetron (ZOFRAN) IV, oxyCODONE, sodium chloride flush, traMADol, zolpidem   Vital Signs    Vitals:   08/27/19 2339 08/28/19 0433 08/28/19 0456 08/28/19 0808  BP: (!) 120/42 130/90  (!) 169/62  Pulse: 75 72  89  Resp: (!) 21 17 18 18   Temp: 97.7 F (36.5 C) 98.1 F (36.7 C)  (!) 97.5 F (36.4 C)  TempSrc: Oral Oral  Oral  SpO2: 95% 97%  96%  Weight:   85.8 kg   Height:        Intake/Output Summary (Last 24 hours) at 08/28/2019 1044 Last data filed at 08/28/2019 3716 Gross per 24 hour  Intake 840 ml  Output 4840 ml  Net -4000 ml   Last 3 Weights 08/28/2019 08/27/2019 08/26/2019  Weight (lbs) 189 lb 1.6 oz 189 lb 14.4 oz 192 lb 7.4 oz  Weight (kg) 85.775 kg 86.138 kg 87.3 kg      Telemetry    NSR, period of sinus tachy- Personally Reviewed  ECG    No new tracing - Personally  Reviewed  Physical Exam   GEN: No acute distress.   Neck: No JVD Cardiac: RRR, soft flow murmurs. No rubs, or gallops.  Respiratory: Clear to auscultation bilaterally. GI: Soft, nontender, non-distended  MS: No edema; No deformity. Neuro:  Nonfocal  Psych: Normal affect   Labs    High Sensitivity Troponin:  No results for input(s): TROPONINIHS in the last 720 hours.    Chemistry Recent Labs  Lab 08/23/19 0220 08/24/19 0349 08/25/19 0429  NA 136 134* 133*  K 4.0 4.2 3.7  CL 99 98 100  CO2 27 26 21*  GLUCOSE 151* 170* 170*  BUN 28* 23 18  CREATININE 1.18 1.15 0.99  CALCIUM 9.2 9.1 8.6*  GFRNONAA 59* >60 >60  GFRAA >60 >60 >60  ANIONGAP 10 10 12      Hematology Recent Labs  Lab 08/23/19 0220 08/24/19 0349 08/25/19 0429  WBC 8.0 14.7* 10.5  RBC 4.44 4.53 4.26  HGB 12.4* 12.6* 11.8*  HCT 37.4* 38.0* 35.5*  MCV 84.2 83.9 83.3  MCH 27.9 27.8 27.7  MCHC 33.2 33.2 33.2  RDW 12.9 12.8 13.0  PLT 168 163 155    BNPNo results for input(s): BNP, PROBNP in the last 168  hours.   DDimer No results for input(s): DDIMER in the last 168 hours.   Radiology    No results found.  Cardiac Studies   TAVR OPERATIVE NOTE   Date of Procedure:08/15/2019  Preoperative Diagnosis:Severe Aortic Stenosis   Postoperative Diagnosis:Same   Procedure:   Transcatheter Aortic Valve Replacement - PercutaneousRightTransfemoral Approach Edwards Sapien 3 Ultra THV (size 13mm, model # [id number], serial # [id number])  Co-Surgeons:Bryan Alveria Apley, MD and Sherren Mocha, MD   Anesthesiologist:Carswell Glennon Mac, MD  Echocardiographer:M. Croitoru, MD  Pre-operative Echo Findings: ? Severe aortic stenosis ? Normalleft ventricular systolic function  Post-operative Echo Findings: ? NOparavalvular leak ? Normalleft ventricular systolic  function   _________________    Echo 08/16/19:  IMPRESSIONS  1. Left ventricular ejection fraction, by estimation, is 55 to 60%. The  left ventricle has normal function. The left ventricle demonstrates  regional wall motion abnormalities (see scoring diagram/findings for  description). There is mild concentric left ventricular hypertrophy. Indeterminate diastolic filling due to E-A fusion.  2. Right ventricular systolic function is normal. The right ventricular  size is normal.  3. The mitral valve is normal in structure. No evidence of mitral valve  regurgitation. No evidence of mitral stenosis.  4. The aortic valve is normal in structure. Aortic valve regurgitation is  not visualized. No aortic stenosis is present. Aortic valve mean gradient  measures 11.7 mmHg. Aortic valve Vmax measures 2.48 m/s.  5. The inferior vena cava is normal in size with greater than 50%  respiratory variability, suggesting right atrial pressure of 3 mmHg.   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 No significant chronic ischemic change.  _______________  Ct angio head/neck 08/16/19 IMPRESSION: 1. Progressive cervical carotid atherosclerosis with 75% right and 65-70% left proximal ICA stenoses. 2. 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 3. New moderate right and mild left M1 stenoses. 4. Unchanged mild left intracranial ICA stenosis. 5. Aortic Atherosclerosis (ICD10-I70.0).  Patient Profile     79 y.o. male with a history of HTN, HLD, DMT2, OSA, history of CVA, multivessel CAD, recently diagnosed renal cell carcinoma and severe AS who presented to [facility] on 08/15/19 for planned TAVR.  Assessment & Plan    Severe AS:s/p successful TAVR with a 26 mm Edwards Sapien 3 THV via the TF approach on 08/15/19. Post operative echo showed EF 55%, normally functioning TAVR with a  mean gradient of 11.7 mm hg and no PVL. No evidence of HAVB. Plavix now discontinued with gross hematuria. Prolonged admission due to issues with acute urinary retention, hematuria and orthostatic hypotension with recurrent syncope. Pending insurance approval at [employer] in [employer]. Covid test now.  Acute on chronic diastolic CHF: appears euvolemic after TAVR, excellent UO even though diuretics were stopped due to orthostatic hypotension.  AKI: creat peaked at 1.74. Now resolved. Rechecking labs today  Cerebrovascular disease:asymptomatic at this time. MR brain showed a4 mm acute infarct left medial frontalcortex, which was felt to be incidentaland procedural related.CTA of the head and neck show moderate 65 to 75% bilateral ICA stenoses but will require continued surveillance and outpatient follow-up.  Orthostatic hypotension with recurrent syncope: all BP meds were held (except very low dose metoprolol). Doxazosin switched to tamsulosin. Compression stockings and abdominal binder placed. Started on midodrine 5mg  TID. Still develops orthostatic sinus tachycardia and hypotension but has not had syncope or pre syncope in at least 1 week  HTN: tolerate  SBP up to 160, at least for now; allowing for permissive supine hypertension given significant orthostasis.   DMT2: continue SSI  Acute urinary retention with hematuria:had coude cath inserted then removed. Now coude cath replaced given recurrent acute urinary retention. Had gross hematuria that has resolved since discontinuing plavix.Doxazosin switched to tamsulosin given significant orthostasis.Discussed with Dr Jeffie Pollock with urology today. He recommended keeping in Coude cath at discharge and consider a voiding trial with Dr. Tresa Moore when he see's him on 7/12 @ 1pm  UTI:no culture obtained. Started on Cipro and white count and fever have resolved. CT abdomen unremarkable. Suspect abdominal pain 2/2 UTI and acute urinary retention    RCC: pre TAVR CT showed a large RCC and pulmonary nodules. Follow up PET scan did not show evidence of metastatic disease. He has an apt with Dr. Tresa Moore with urologyon 6/29 which I have moved out to 7/12 @1  pm.   Pulmonary nodules: PET scan showed a dominant right lower lobe 1.2 cm solid pulmonary nodule is non-hypermetabolic. Additional small solid subcentimeter pulmonary nodules in both lungs are below PET resolution and warrant attention on follow-up chest CT in 3 months. This will be followed over time.     Signed, Angelena Form, PA-C  08/28/2019, 10:44 AM    Patient seen, examined. Available data reviewed. Agree with findings, assessment, and plan as outlined by Nell Range, PA-C.  The patient is clinically stable, but frustrated with his inability to have a bowel movement.  He has been taking MiraLAX.  He requests a suppository as this is worked well for him in the past.  He otherwise has no specific complaints.  The patient's orthostatic vital signs are reviewed and have improved significantly.  He just returned from a walk and tolerated this well.  He is awaiting insurance approval and COVID-19 swab results before discharge to skilled nursing.  Anticipate hospital discharge tomorrow.  I have reviewed all of his medicines and have not recommended any changes at this time.  As outlined above, urology follow-up has been rescheduled.  The patient will leave with the catheter in place, his urine has cleared on visual inspection.  Sherren Mocha, M.D. 08/28/2019 5:43 PM

## 2019-08-28 NOTE — Progress Notes (Signed)
CARDIAC REHAB PHASE I   Returned twice more throughout the day to ambulate with pt. Pt seen working with PT and mobility tech. Will continue to follow.  Rufina Falco, RN BSN 08/28/2019 2:24 PM

## 2019-08-28 NOTE — Care Management Important Message (Signed)
Important Message  Patient Details  Name: Alexis Griffith MRN: [id number] Date of Birth: [birth year]   Medicare Important Message Given:  Yes     [name] 08/28/2019, 10:01 AM

## 2019-08-28 NOTE — Progress Notes (Signed)
CARDIAC REHAB PHASE I   Offered to walk with pt. Pt tired from recent trip to restroom. Pt states continued mobility over the weekend, waiting on MD for plan. Will f/u later to continue to encourage ambulation.  Rufina Falco, RN BSN 08/28/2019 8:38 AM

## 2019-08-28 NOTE — Progress Notes (Signed)
Mobility Specialist: Progress Note    08/28/19 1402  Mobility  Activity Ambulated in hall  Level of Assistance Modified independent, requires aide device or extra time  Assistive Device Front wheel walker  Distance Ambulated (ft) 250 ft  Mobility Response Tolerated well  Mobility performed by Mobility specialist  $Mobility charge 1 Mobility   Pre-Mobility: 72 HR, 153/66 BP, 96% SpO2 Post-Mobility: 93 HR, 158/66 BP, 96% SpO2   Psychologist, clinical

## 2019-08-28 NOTE — Progress Notes (Signed)
Physical Therapy Treatment Patient Details Name: Alexis Griffith MRN: [id number] DOB: 02/27/1941 Today's Date: 08/28/2019    History of Present Illness 79 y.o. male with a history of HTN, HLD, DMT2, OSA, history of CVA, multivessel CAD, recently diagnosed renal cell carcinoma and severe AS who presented to [facility] on 08/15/19 for TAVR. Course complicated by AMS, BP and HR drop, imaging reveals 4 mm acute infarct L medial frontal cortex.    PT Comments    Patient progressing well towards PT goals. Noted to have some anxieties about mobilizing due to not having taken morning medications and not feeling the best. Improved ambulation distance with Min guard assist for balance/safety with use of RW for support. HR up to 129 bpm, Sp02 >93% on RA. No evidence of orthostasis. Encouraged up in chair and walking 2 more times today. Will continue to follow and progress as tolerated.    Follow Up Recommendations  SNF;Supervision/Assistance - 24 hour     Equipment Recommendations  None recommended by PT    Recommendations for Other Services       Precautions / Restrictions Precautions Precautions: Fall Precaution Comments: Watch BP and HR Restrictions Weight Bearing Restrictions: No    Mobility  Bed Mobility Overal bed mobility: Needs Assistance Bed Mobility: Supine to Sit     Supine to sit: Min assist;HOB elevated     General bed mobility comments: Assist with trunk to get to EOB. Use of rails and increased time. No dizziness.  Transfers Overall transfer level: Needs assistance Equipment used: Rolling walker (2 wheeled) Transfers: Sit to/from Stand Sit to Stand: Min assist         General transfer comment: Min A for steadying assist. Demonstrated safe hand placement. Transferred to chair post ambulation.  Ambulation/Gait Ambulation/Gait assistance: Min guard Gait Distance (Feet): 200 Feet Assistive device: Rolling walker (2 wheeled) Gait Pattern/deviations: Step-through  pattern;Decreased stride length;Trunk flexed Gait velocity: decreased Gait velocity interpretation: 1.31 - 2.62 ft/sec, indicative of limited community ambulator General Gait Details: Min guard for safety. Distance limited secondary to fatigue. HR up to 129 bpm, SP02 >93% on RA.   Stairs             Wheelchair Mobility    Modified Rankin (Stroke Patients Only)       Balance Overall balance assessment: Needs assistance Sitting-balance support: Feet supported;No upper extremity supported Sitting balance-Leahy Scale: Good Sitting balance - Comments: supervision   Standing balance support: During functional activity;Bilateral upper extremity supported Standing balance-Leahy Scale: Poor Standing balance comment: Reliant on UE support                             Cognition Arousal/Alertness: Awake/alert Behavior During Therapy: Anxious Overall Cognitive Status: Within Functional Limits for tasks assessed                                 General Comments: General anxieties about not taking his morning medications and mobility. "my heart rate got up to 100 just laying here."      Exercises      General Comments General comments (skin integrity, edema, etc.): No evidence of orthostasis today.      Pertinent Vitals/Pain Pain Assessment: Faces Faces Pain Scale: Hurts a little bit Pain Location: head, behind eyes Pain Descriptors / Indicators: Sore;Aching;Discomfort Pain Intervention(s): Monitored during session;Repositioned    Home Living  Prior Function            PT Goals (current goals can now be found in the care plan section) Progress towards PT goals: Progressing toward goals    Frequency    Min 3X/week      PT Plan Current plan remains appropriate    Co-evaluation              AM-PAC PT "6 Clicks" Mobility   Outcome Measure  Help needed turning from your back to your side while in a  flat bed without using bedrails?: None Help needed moving from lying on your back to sitting on the side of a flat bed without using bedrails?: A Little Help needed moving to and from a bed to a chair (including a wheelchair)?: A Little Help needed standing up from a chair using your arms (e.g., wheelchair or bedside chair)?: A Little Help needed to walk in hospital room?: A Little Help needed climbing 3-5 steps with a railing? : A Lot 6 Click Score: 18    End of Session Equipment Utilized During Treatment: Gait belt Activity Tolerance: Patient tolerated treatment well;Patient limited by fatigue Patient left: in chair;with call bell/phone within reach Nurse Communication: Mobility status PT Visit Diagnosis: Other abnormalities of gait and mobility (R26.89);Muscle weakness (generalized) (M62.81);Other symptoms and signs involving the nervous system (R29.898)     Time: 7564-3329 PT Time Calculation (min) (ACUTE ONLY): 27 min  Charges:  $Therapeutic Exercise: 8-22 mins $Therapeutic Activity: 8-22 mins                     [name], PT, DPT Acute Rehabilitation Services Pager [phone] [place] 08/28/2019, 1:30 PM

## 2019-08-28 NOTE — TOC Progression Note (Signed)
Transition of Care [facility]) - Progression Note    Patient Details  Name: ERCELL RAZON MRN: [id number] Date of Birth: 1940-10-09  Transition of Care [facility]) CM/SW [place], [place] Phone Number: 08/28/2019, 10:17 AM  Clinical Narrative:     Patient states preferred SNF is [facility].   CSW contacted [facility]-waiting on response.  Thurmond Butts, MSW, LCSWA Clinical Social Worker     Barriers to Discharge: Ship broker, Continued Medical Work up, SNF Pending bed offer  Expected Discharge Plan and Services   In-house Referral: Clinical Social Work     Living arrangements for the past 2 months: Single Family Home                                       Social Determinants of Health (SDOH) Interventions    Readmission Risk Interventions No flowsheet data found.

## 2019-08-29 LAB — GLUCOSE, CAPILLARY
Glucose-Capillary: 188 mg/dL — ABNORMAL HIGH (ref 70–99)
Glucose-Capillary: 223 mg/dL — ABNORMAL HIGH (ref 70–99)
Glucose-Capillary: 268 mg/dL — ABNORMAL HIGH (ref 70–99)
Glucose-Capillary: 155 mg/dL — ABNORMAL HIGH (ref 70–99)

## 2019-08-29 MED ORDER — FLUDROCORTISONE ACETATE 0.1 MG PO TABS
0.1000 mg | ORAL_TABLET | Freq: Every day | ORAL | Status: DC
  Administered 2019-08-29 – 2019-08-30 (×2): 0.1 mg via ORAL
  Filled 2019-08-29 (×2): qty 1

## 2019-08-29 MED ORDER — MAGNESIUM HYDROXIDE 400 MG/5ML PO SUSP
5.0000 mL | Freq: Every day | ORAL | Status: DC
  Administered 2019-08-29 – 2019-08-31 (×2): 5 mL via ORAL
  Filled 2019-08-29 (×3): qty 30

## 2019-08-29 NOTE — Progress Notes (Signed)
Mobility Specialist - Progress Note   08/29/19 1513  Mobility  Activity Ambulated in hall  Level of Assistance Modified independent, requires aide device or extra time  Assistive Device Front wheel walker  Distance Ambulated (ft) 200 ft  Mobility Response Tolerated well  Mobility performed by Mobility specialist  $Mobility charge 1 Mobility    Pre-mobility:   Sitting: 86 HR, 153/65 BP, 99% SpO2  Standing: 101 HR, 135/54 BP, 98% SpO2  Standing at 3 min: 100, HR, 139/52 BP, 100% SpO2 During mobility: 100 HR, 140/66 BP, 100% SpO2 Post-mobility: 85 HR, 161/58 BP, 97% SpO2  Pt's O2 dropped to low 80s twice while ambulating, quickly rose to mid 90s with standing rest breaks and taking deep breaths.  Pricilla Handler Mobility Specialist Mobility Specialist Phone: [phone]

## 2019-08-29 NOTE — Progress Notes (Signed)
CARDIAC REHAB PHASE I   Checked on pt. Pt disappointed he is not leaving today but understands the reasoning. Still trying to have a BM. To work with PT today. Will follow their progress.  [phone] Rufina Falco, RN BSN 08/29/2019 11:33 AM

## 2019-08-29 NOTE — Progress Notes (Signed)
Inpatient Diabetes Program Recommendations  AACE/ADA: New Consensus Statement on Inpatient Glycemic Control (2015)  Target Ranges:  Prepandial:   less than 140 mg/dL      Peak postprandial:   less than 180 mg/dL (1-2 hours)      Critically ill patients:  140 - 180 mg/dL   Lab Results  Component Value Date   GLUCAP 188 (H) 08/29/2019   HGBA1C 6.4 (H) 08/17/2019    Review of Glycemic Control Results for MARKANTHONY, GEDNEY (MRN [id number]) as of 08/29/2019 09:58  Ref. Range 08/27/2019 22:39 08/28/2019 06:21 08/28/2019 11:03 08/28/2019 16:48 08/29/2019 06:17  Glucose-Capillary Latest Ref Range: 70 - 99 mg/dL 209 (H) 153 (H) 239 (H) 241 (H) 188 (H)   Diabetes history:  DM2  Outpatient Diabetes medications:  Lantus 65 units daily  Januvia 100 mg daily   Current orders for Inpatient glycemic control:  Lantus 18 units daily  Novolog 0-24 units tid & hs  Inpatient Diabetes Program Recommendations:     Post prandials elevated -Novolog 5 units tid instead of bid with meals.  Noted that MD increased Lantus today to 18 units daily  Will continue to follow while inpatient.  Thank you, Reche Dixon, RN, BSN Diabetes Coordinator Inpatient Diabetes Program [phone] (team pager from 8a-5p)

## 2019-08-29 NOTE — Progress Notes (Addendum)
Progress Note  Patient Name: Alexis Griffith Date of Encounter: 08/29/2019  [facility] Cardiologist: Mertie Moores, MD / Dr. Burt Knack & Dr. Cyndia Bent (TAVR)  Subjective   Did not eat well yesterday bc of abdominal bloating. Suppository initiated only gas and no BM. Dissapointed coude cath must stay in place. Had an episode of syncope this morning when orthostatic vital signs being checked   Inpatient Medications    Scheduled Meds: . aspirin  81 mg Oral Daily  . Chlorhexidine Gluconate Cloth  6 each Topical Daily  . ciprofloxacin  500 mg Oral BID  . clonazePAM  0.5 mg Oral QID  . fludrocortisone  0.1 mg Oral Daily  . insulin aspart  0-24 Units Subcutaneous TID AC & HS  . insulin aspart  5 Units Subcutaneous BID WC  . insulin glargine  18 Units Subcutaneous Daily  . magnesium hydroxide  5 mL Oral Daily  . metoprolol tartrate  12.5 mg Oral BID  . midodrine  5 mg Oral TID WC  . polyethylene glycol  17 g Oral Daily  . rosuvastatin  5 mg Oral Daily  . sodium chloride flush  3 mL Intravenous Q12H  . tamsulosin  0.4 mg Oral QPC supper   Continuous Infusions: . sodium chloride 20 mL/hr at 08/17/19 2131   PRN Meds: sodium chloride, acetaminophen **OR** acetaminophen, bismuth subsalicylate, Glycerin (Adult), morphine injection, ondansetron (ZOFRAN) IV, oxyCODONE, sodium chloride flush, traMADol, zolpidem   Vital Signs    Vitals:   08/28/19 2337 08/29/19 0447 08/29/19 0502 08/29/19 0751  BP: (!) 117/50 122/83 (!) 128/46 (!) 163/61  Pulse: 74 79 75 96  Resp: 19 18 17 17   Temp: 98.1 F (36.7 C) 98.2 F (36.8 C) 98.2 F (36.8 C) 97.9 F (36.6 C)  TempSrc: Oral Oral Oral Oral  SpO2: 97%  94% 94%  Weight:  79.1 kg    Height:        Intake/Output Summary (Last 24 hours) at 08/29/2019 1010 Last data filed at 08/29/2019 0754 Gross per 24 hour  Intake 120 ml  Output 6300 ml  Net -6180 ml   Last 3 Weights 08/29/2019 08/28/2019 08/27/2019  Weight (lbs) 174 lb 6.1 oz 189 lb 1.6 oz  189 lb 14.4 oz  Weight (kg) 79.1 kg 85.775 kg 86.138 kg      Telemetry    NSR, 1st deg AV block-Personally Reviewed  ECG    No new tracing - Personally Reviewed  Physical Exam   GEN: No acute distress.   Neck: No JVD Cardiac: RRR, soft flow murmurs. No rubs, or gallops.  Respiratory: Clear to auscultation bilaterally. GI: Soft, nontender, non-distended + bowel sounds MS: No edema; No deformity. Neuro:  Nonfocal  Psych: Normal affect   Labs    High Sensitivity Troponin:  No results for input(s): TROPONINIHS in the last 720 hours.    Chemistry Recent Labs  Lab 08/24/19 0349 08/25/19 0429 08/28/19 1156  NA 134* 133* 132*  K 4.2 3.7 3.6  CL 98 100 99  CO2 26 21* 23  GLUCOSE 170* 170* 224*  BUN 23 18 9   CREATININE 1.15 0.99 0.87  CALCIUM 9.1 8.6* 9.0  GFRNONAA >60 >60 >60  GFRAA >60 >60 >60  ANIONGAP 10 12 10      Hematology Recent Labs  Lab 08/24/19 0349 08/25/19 0429 08/28/19 1156  WBC 14.7* 10.5 7.9  RBC 4.53 4.26 4.68  HGB 12.6* 11.8* 12.7*  HCT 38.0* 35.5* 38.2*  MCV 83.9 83.3 81.6  MCH  27.8 27.7 27.1  MCHC 33.2 33.2 33.2  RDW 12.8 13.0 12.8  PLT 163 155 210    BNPNo results for input(s): BNP, PROBNP in the last 168 hours.   DDimer No results for input(s): DDIMER in the last 168 hours.   Radiology    No results found.  Cardiac Studies   TAVR OPERATIVE NOTE   Date of Procedure:08/15/2019  Preoperative Diagnosis:Severe Aortic Stenosis   Postoperative Diagnosis:Same   Procedure:   Transcatheter Aortic Valve Replacement - PercutaneousRightTransfemoral Approach Edwards Sapien 3 Ultra THV (size 61mm, model # [id number], serial # [id number])  Co-Surgeons:Bryan Alveria Apley, MD and Sherren Mocha, MD   Anesthesiologist:Carswell Glennon Mac, MD  Echocardiographer:M. Croitoru, MD  Pre-operative Echo Findings: ? Severe aortic  stenosis ? Normalleft ventricular systolic function  Post-operative Echo Findings: ? NOparavalvular leak ? Normalleft ventricular systolic function   _________________    Echo 08/16/19:  IMPRESSIONS  1. Left ventricular ejection fraction, by estimation, is 55 to 60%. The  left ventricle has normal function. The left ventricle demonstrates  regional wall motion abnormalities (see scoring diagram/findings for  description). There is mild concentric left ventricular hypertrophy. Indeterminate diastolic filling due to E-A fusion.  2. Right ventricular systolic function is normal. The right ventricular  size is normal.  3. The mitral valve is normal in structure. No evidence of mitral valve  regurgitation. No evidence of mitral stenosis.  4. The aortic valve is normal in structure. Aortic valve regurgitation is  not visualized. No aortic stenosis is present. Aortic valve mean gradient  measures 11.7 mmHg. Aortic valve Vmax measures 2.48 m/s.  5. The inferior vena cava is normal in size with greater than 50%  respiratory variability, suggesting right atrial pressure of 3 mmHg.   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 No significant chronic ischemic change.  _______________  Ct angio head/neck 08/16/19 IMPRESSION: 1. Progressive cervical carotid atherosclerosis with 75% right and 65-70% left proximal ICA stenoses. 2. 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 3. New moderate right and mild left M1 stenoses. 4. Unchanged mild left intracranial ICA stenosis. 5. Aortic Atherosclerosis (ICD10-I70.0).  Patient Profile     79 y.o. male with a history of HTN, HLD, DMT2, OSA, history of CVA, multivessel CAD, recently diagnosed renal cell carcinoma and severe AS who presented to [facility] 08/[facility]19 for planned TAVR.  Assessment & Plan    Severe AS:s/p  successful TAVR with a 26 mm Edwards Sapien 3 THV via the TF approach on 08/15/19. Post operative echo showed EF 55%, normally functioning TAVR with a mean gradient of 11.7 mm hg and no PVL. No evidence of HAVB. Plavix now discontinued with gross hematuria. Prolonged admission due to issues with acute urinary retention, hematuria and orthostatic hypotension with recurrent syncope. Pending insurance approval at [employer] in [place]. Covid test negative. Potential DC there tomorrow if he remains stable.   Acute on chronic diastolic CHF: appears euvolemic after TAVR, excellent UO even though diuretics were stopped due to orthostatic hypotension.  AKI: creat peaked at 1.74. Now resolved.   Cerebrovascular disease:asymptomatic at this time. MR brain showed a4 mm acute infarct left medial frontalcortex, which was felt to be incidentaland procedural related.CTA of the head and neck show moderate 65 to 75% bilateral ICA stenoses but will require continued surveillance and outpatient follow-up.  Orthostatic hypotension with recurrent syncope: all BP meds were held (except very low dose metoprolol  which was added back due to rebound tachycardia). Doxazosin switched to tamsulosin. Compression stockings and abdominal binder placed. Started on midodrine 5mg  TID. Had another episode of syncope this morning when orthostatic vital signs being checked. BP dropped from 110/56 --> 70/56. He had poor PO intake yesterday. Plan to STOP tamsulosin since he has a coude cath in place, encourage good PO intake, and start Florinef 0.1mg  daily. We will ask Dr. Caryl Comes to see him tomorrow when he is back in the hospital.   ** nursing care order placed asking staff to not check orthostatic vital signs in the early morning, prior to breakfast. **  HTN: tolerate SBP up to 160, at least for now; allowing for permissive supine hypertension given significant orthostasis.   DMT2: continue SSI  Acute urinary retention with  hematuria:had coude cath inserted then removed. Now coude cath replaced given recurrent acute urinary retention. Had gross hematuria that has resolved since discontinuing plavix.Doxazosin switched to tamsulosin given significant orthostasis.Discussed with Dr Jeffie Pollock with urology today. He recommended keeping in Coude cath at discharge and consider a voiding trial with Dr. Tresa Moore when he sees him on 7/12 @ 1pm  UTI:no culture obtained. Started on Cipro and white count and fever have resolved. CT abdomen unremarkable. Suspect abdominal pain 2/2 UTI and acute urinary retention   RCC: pre TAVR CT showed a large RCC and pulmonary nodules. Follow up PET scan did not show evidence of metastatic disease. He has an apt with Dr. Tresa Moore with urologyon 6/29 which I have moved out to 7/12 @1  pm.   Pulmonary nodules: PET scan showed a dominant right lower lobe 1.2 cm solid pulmonary nodule is non-hypermetabolic. Additional small solid subcentimeter pulmonary nodules in both lungs are below PET resolution and warrant attention on follow-up chest CT in 3 months. This will be followed over time.  Constipation: will order MOM     Signed, Angelena Form, PA-C  08/29/2019, 10:10 AM    Patient seen, examined. Available data reviewed. Agree with findings, assessment, and plan as outlined by Nell Range, PA-C.  On my exam this morning, the patient is alert, oriented, in no distress.  Lungs are clear, JVP is normal, heart is regular rate and rhythm with a 2/6 systolic murmur at the right upper sternal border with no diastolic murmur, abdomen is soft and nontender with positive bowel sounds, extremities have no edema.  Discussed events that occurred overnight.  The patient continues to struggle with difficulty moving his bowels and continued intermittent episodes of orthostatic hypotension.  Agree with plans as outlined above.  We will stop tamsulosin and start Florinef 0.1 mg daily.  Continue midodrine with meals.   Continue compression stockings.  Will ask Dr. Caryl Comes to give input on whether any other measures should be taken.  I am hopeful that he can be discharged to short-term skilled nursing over the next day or 2 if his symptoms improved.  Sherren Mocha, M.D. 08/29/2019 10:20 AM

## 2019-08-29 NOTE — TOC Progression Note (Signed)
Transition of Care [facility]) - Progression Note    Patient Details  Name: Alexis Griffith MRN: [id number] Date of Birth: June 13, 1940  Transition of Care [facility]) CM/SW [place], [place] Phone Number: 08/29/2019, 6:45 PM  Clinical Narrative:     Insurance still pending   Thurmond Butts, MSW, LCSWA Clinical Social Worker     Barriers to Discharge: Ship broker, Continued Medical Work up, SNF Pending bed offer  Expected Discharge Plan and Services   In-house Referral: Clinical Social Work     Living arrangements for the past 2 months: Single Family Home                                       Social Determinants of Health (SDOH) Interventions    Readmission Risk Interventions No flowsheet data found.

## 2019-08-29 NOTE — Progress Notes (Signed)
Physical Therapy Treatment Patient Details Name: Alexis Griffith MRN: [id number] DOB: 1940-10-24 Today's Date: 08/29/2019    History of Present Illness 79 y.o. male with a history of HTN, HLD, DMT2, OSA, history of CVA, multivessel CAD, recently diagnosed renal cell carcinoma and severe AS who presented to [facility] on 08/15/19 for TAVR. Course complicated by AMS, BP and HR drop, imaging reveals 4 mm acute infarct L medial frontal cortex.    PT Comments    Patient reports feeling uncomfortable today due to inability to have a BM and abdominal discomfort. Continues to require Min A for bed mobility and transfers. Reports dizziness with transitions today and noted to have positive orthostasis today. See BPs below. Supine BP 167/67 Sitting BP 135/63 Standing BP 107/51 Sitting BP post walk 116/46 RN notified of BPs. Pt eager to get to bathroom so tolerated walking short distance with Min A and RW for safety. Plans to walk with mobility tech as able this afternoon. Will follow and progress as tolerated.   Follow Up Recommendations  SNF;Supervision/Assistance - 24 hour     Equipment Recommendations  None recommended by PT    Recommendations for Other Services       Precautions / Restrictions Precautions Precautions: Fall Precaution Comments: Watch BP and HR Restrictions Weight Bearing Restrictions: No    Mobility  Bed Mobility Overal bed mobility: Needs Assistance Bed Mobility: Supine to Sit     Supine to sit: Min assist;HOB elevated     General bed mobility comments: Assist with trunk to get to EOB. Use of rails and increased time. Mild dizziness reported  Transfers Overall transfer level: Needs assistance Equipment used: Rolling walker (2 wheeled) Transfers: Sit to/from Stand Sit to Stand: Min assist         General transfer comment: Min A to steady with posterior LOB initially. Demonstrated safe hand placement. Transferred to toilet post ambulation to try and have a  BM.  Ambulation/Gait Ambulation/Gait assistance: Min guard Gait Distance (Feet): 30 Feet Assistive device: Rolling walker (2 wheeled) Gait Pattern/deviations: Step-through pattern;Decreased stride length;Trunk flexed Gait velocity: decreased   General Gait Details: Slow, mostly steady gait with RW for support; HR stable. Drop in BP. See assessment for details.   Stairs             Wheelchair Mobility    Modified Rankin (Stroke Patients Only) Modified Rankin (Stroke Patients Only) Pre-Morbid Rankin Score: No significant disability Modified Rankin: Moderately severe disability     Balance Overall balance assessment: Needs assistance Sitting-balance support: Feet supported;No upper extremity supported Sitting balance-Leahy Scale: Good Sitting balance - Comments: supervision   Standing balance support: During functional activity Standing balance-Leahy Scale: Poor Standing balance comment: Reliant on UE support and close Min guard-Min A today due to dizziness and BP drop.                            Cognition Arousal/Alertness: Awake/alert Behavior During Therapy: Anxious Overall Cognitive Status: Within Functional Limits for tasks assessed                                 General Comments: for basic mobility tasks. General anxieties about movement/BP.      Exercises      General Comments General comments (skin integrity, edema, etc.): Supine BP 167/67, sitting BP 135/63, standing BP 107/51, sitting BP post walk 116/46. RN aware.  Pertinent Vitals/Pain Pain Assessment: Faces Faces Pain Scale: Hurts a little bit Pain Location: abdomen Pain Descriptors / Indicators: Aching;Discomfort Pain Intervention(s): Monitored during session;Repositioned    Home Living                      Prior Function            PT Goals (current goals can now be found in the care plan section) Progress towards PT goals: Progressing toward  goals (slowly)    Frequency    Min 3X/week      PT Plan Current plan remains appropriate    Co-evaluation              AM-PAC PT "6 Clicks" Mobility   Outcome Measure  Help needed turning from your back to your side while in a flat bed without using bedrails?: None Help needed moving from lying on your back to sitting on the side of a flat bed without using bedrails?: A Little Help needed moving to and from a bed to a chair (including a wheelchair)?: A Little Help needed standing up from a chair using your arms (e.g., wheelchair or bedside chair)?: A Little Help needed to walk in hospital room?: A Little Help needed climbing 3-5 steps with a railing? : A Lot 6 Click Score: 18    End of Session Equipment Utilized During Treatment: Gait belt Activity Tolerance: Treatment limited secondary to medical complications (Comment) (drop in BP and needing to have a BM) Patient left: Other (comment);with call bell/phone within reach (sitting on toilet, RN aware) Nurse Communication: Mobility status;Other (comment) (BP and pt sitting on toilet) PT Visit Diagnosis: Other abnormalities of gait and mobility (R26.89);Muscle weakness (generalized) (M62.81);Other symptoms and signs involving the nervous system (R29.898)     Time: 1242-1300 PT Time Calculation (min) (ACUTE ONLY): 18 min  Charges:  $Therapeutic Activity: 8-22 mins                     [name], PT, DPT Acute Rehabilitation Services Pager [phone] Office [phone]       [name] 08/29/2019, 3:34 PM

## 2019-08-29 NOTE — Progress Notes (Addendum)
Patient had an assisted fall this morning at approximately 0500. The fall was witnessed by my self and Cora Daniels, RN. Dr Rob Hickman was notified via [place].  Lonia Blood, RN

## 2019-08-30 ENCOUNTER — Inpatient Hospital Stay (HOSPITAL_COMMUNITY): Payer: Medicare Other

## 2019-08-30 LAB — GLUCOSE, CAPILLARY
Glucose-Capillary: 196 mg/dL — ABNORMAL HIGH (ref 70–99)
Glucose-Capillary: 252 mg/dL — ABNORMAL HIGH (ref 70–99)
Glucose-Capillary: 159 mg/dL — ABNORMAL HIGH (ref 70–99)
Glucose-Capillary: 194 mg/dL — ABNORMAL HIGH (ref 70–99)

## 2019-08-30 MED ORDER — INSULIN GLARGINE 100 UNIT/ML ~~LOC~~ SOLN
22.0000 [IU] | Freq: Every day | SUBCUTANEOUS | Status: DC
  Administered 2019-08-31: 22 [IU] via SUBCUTANEOUS
  Filled 2019-08-30: qty 0.22

## 2019-08-30 MED ORDER — INSULIN ASPART 100 UNIT/ML ~~LOC~~ SOLN
0.0000 [IU] | Freq: Three times a day (TID) | SUBCUTANEOUS | Status: DC
  Administered 2019-08-30: 3 [IU] via SUBCUTANEOUS
  Administered 2019-08-30: 8 [IU] via SUBCUTANEOUS
  Administered 2019-08-31: 3 [IU] via SUBCUTANEOUS

## 2019-08-30 MED ORDER — INSULIN ASPART 100 UNIT/ML ~~LOC~~ SOLN: 0.0000 [IU] | Freq: Three times a day (TID) | SUBCUTANEOUS | Status: DC

## 2019-08-30 MED ORDER — INSULIN ASPART 100 UNIT/ML ~~LOC~~ SOLN
10.0000 [IU] | Freq: Three times a day (TID) | SUBCUTANEOUS | Status: DC
  Administered 2019-08-30 – 2019-08-31 (×3): 10 [IU] via SUBCUTANEOUS

## 2019-08-30 MED ORDER — INSULIN ASPART 100 UNIT/ML ~~LOC~~ SOLN: 10.0000 [IU] | Freq: Two times a day (BID) | SUBCUTANEOUS | Status: DC

## 2019-08-30 MED ORDER — MIDODRINE HCL 5 MG PO TABS
5.0000 mg | ORAL_TABLET | Freq: Three times a day (TID) | ORAL | Status: DC
  Administered 2019-08-30 – 2019-08-31 (×4): 5 mg via ORAL
  Filled 2019-08-30 (×4): qty 1

## 2019-08-30 MED ORDER — MELOXICAM 7.5 MG PO TABS
15.0000 mg | ORAL_TABLET | Freq: Every day | ORAL | Status: DC
  Administered 2019-08-30 – 2019-08-31 (×2): 15 mg via ORAL
  Filled 2019-08-30 (×2): qty 2

## 2019-08-30 MED ORDER — PYRIDOSTIGMINE BROMIDE 60 MG PO TABS
30.0000 mg | ORAL_TABLET | Freq: Two times a day (BID) | ORAL | Status: DC
  Administered 2019-08-30 – 2019-08-31 (×2): 30 mg via ORAL
  Filled 2019-08-30 (×4): qty 0.5

## 2019-08-30 MED ORDER — CEPHALEXIN 500 MG PO CAPS
500.0000 mg | ORAL_CAPSULE | Freq: Three times a day (TID) | ORAL | Status: DC
  Administered 2019-08-30 (×2): 500 mg via ORAL
  Filled 2019-08-30 (×4): qty 1

## 2019-08-30 NOTE — Progress Notes (Signed)
Scrotal ultrasound in keeping with the clinical diagnosis of epididymitis.  Does not need surgical drainage and should be treated with the antibiotics ordered

## 2019-08-30 NOTE — Progress Notes (Signed)
OT Cancellation Note  Patient Details Name: Alexis Griffith MRN: [id number] DOB: 01/15/1941   Cancelled Treatment:    Reason Eval/Treat Not Completed: Patient at procedure or test/ unavailable Pt currently off the unit. OT will return as time allows and pt is appropriate.   [facility] OTR/L Acute Rehabilitation Services Office: [place] 08/30/2019, 3:04 PM

## 2019-08-30 NOTE — Progress Notes (Signed)
Patient urine output 540/hr and is about -44,000L since admission with history of mass on left kidney. RN notified Fellow Carlise, of assessment findings.

## 2019-08-30 NOTE — TOC Progression Note (Signed)
Transition of Care [facility]) - Progression Note    Patient Details  Name: Alexis Griffith MRN: [id number] Date of Birth: 1940-10-25  Transition of Care [facility]) CM/SW [place], [place] Phone Number: 08/30/2019, 10:08 AM  Clinical Narrative:     Received insurance authorization for SNF # [id number] from 06/30-07/02, reference # [id number]. Insurance case manager Josie Saunders.  CSW waiting for patient to be medically cleared- per [facility] will need discharge summary by 1pm , if patient going to discharge to SNF today.  Thurmond Butts, MSW, LCSWA Clinical Social Worker     Barriers to Discharge: Ship broker, Continued Medical Work up, SNF Pending bed offer  Expected Discharge Plan and Services   In-house Referral: Clinical Social Work     Living arrangements for the past 2 months: Single Family Home                                       Social Determinants of Health (SDOH) Interventions    Readmission Risk Interventions No flowsheet data found.

## 2019-08-30 NOTE — Consult Note (Signed)
Urology Consult  Referring physician: Kathlene November Reason for referral: swollen scrotum  Chief Complaint: swollen scrotum  History of Present Illness: I was consulted to reassess patient.  Apparently he had cardiac valve prolonged course.  He is a daily catheter I believe Dr. Jeffie Pollock and Dr Alinda Money and is to see Dr Tresa Moore for renal mass  It appears he had gross hematuria at one point in time but his Plavix had been stopped.  They were instructed not to take it the catheter and is put on Flomax.  He was given ciprofloxacin for a possible urinary tract infection but culture was not sent.  He started developing right scrotal pain over a few days.  Patient confirmed that he started to get a little bit sore with physical therapy over the few days.  No scrotal trauma.  Scrotal ultrasound imaging.  He has allergies noted.  Discussed with his physician and providers.    Past Medical History:  Diagnosis Date  . Anxiety   . CAD (coronary artery disease)   . Carotid artery disease ([place]) 08/24/2011  . Coronary artery disease involving native coronary artery of native heart with angina pectoris ([place]) 07/04/2019  . Diabetes mellitus without complication ([place])   . Excessive urination at night 06/05/2013  . Giant cell arteritis ([place]) 05/17/2012  . Hyperlipidemia   . Hypertension   . PVC (premature ventricular contraction)   . Renal mass   . 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 Burt Knack & Dr. Cyndia Bent  . Severe aortic stenosis   . Sinus bradycardia on ECG   . Sleep apnea   . 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 Wilburn Cornelia.  . Stroke ([place])   . TIA (transient ischemic attack)    Past Surgical History:  Procedure Laterality Date  . CATARACT EXTRACTION, BILATERAL  10/2015, and 11/2015   with adding  TORIC lenses bilaterally   . INTRAVASCULAR PRESSURE WIRE/FFR STUDY N/A 07/07/2019   Procedure:  INTRAVASCULAR PRESSURE WIRE/FFR STUDY;  Surgeon: [name], MD;  Location: [place] [facility];  Service: Cardiovascular;  Laterality: N/A;  . LEFT HEART CATH AND CORONARY ANGIOGRAPHY N/A 07/07/2019   Procedure: LEFT HEART CATH AND CORONARY ANGIOGRAPHY;  Surgeon: [name], MD;  Location: [place] [facility];  Service: Cardiovascular;  Laterality: N/A;  . SQUAMOUS CELL CARCINOMA EXCISION     tongue  . TEE WITHOUT CARDIOVERSION N/A 08/15/2019   Procedure: TRANSESOPHAGEAL ECHOCARDIOGRAM (TEE);  Surgeon: Sherren Mocha, MD;  Location: [place];  Service: Open Heart Surgery;  Laterality: N/A;  . TRANSCATHETER AORTIC VALVE REPLACEMENT, TRANSFEMORAL N/A 08/15/2019   Procedure: TRANSCATHETER AORTIC VALVE REPLACEMENT, TRANSFEMORAL using Edwards [employer] SAPIEN 3 Ultra 26 mm THV.;  Surgeon: Sherren Mocha, MD;  Location: [place];  Service: Open Heart Surgery;  Laterality: N/A;    Medications: I have reviewed the patient's current medications. Allergies:  Allergies  Allergen Reactions  . Macrodantin [Nitrofurantoin] Anaphylaxis    blocked kidneys   . Sulfa Antibiotics Rash  . Lisinopril Cough  . Penicillins     Unknown    Family History  Problem Relation Age of Onset  . Heart disease Mother   . Heart attack Mother   . Heart disease Father   . Heart attack Father    Social History:  reports that he quit smoking about 44 years ago. He has never used smokeless tobacco. He reports  that he does not drink alcohol and does not use drugs.  ROS: All systems are reviewed and negative except as noted. Rest negative  Physical Exam:  Vital signs in last 24 hours: Temp:  [97.9 F (36.6 C)-98.3 F (36.8 C)] 97.9 F (36.6 C) (06/30 0735) Pulse Rate:  [83-102] 90 (06/30 0735) Resp:  [17-24] 19 (06/30 0735) BP: (142-160)/(58-81) 160/66 (06/30 0735) SpO2:  [94 %-99 %] 96 % (06/30 0735) Weight:  [86.5 kg] 86.5 kg (06/30 0903)  Cardiovascular: Skin warm; not flushed Respiratory: Breaths  quiet; no shortness of breath Abdomen: No masses Neurological: Normal sensation to touch Musculoskeletal: Normal motor function arms and legs Lymphatics: No inguinal adenopathy Skin: No rashes Genitourinary: Mild swelling right hemiscrotum.  Diffuse swelling with epididymoorchitis.  Laboratory Data:  Results for orders placed or performed during the hospital encounter of 08/15/19 (from the past 72 hour(s))  Glucose, capillary     Status: Abnormal   Collection Time: 08/27/19 11:21 AM  Result Value Ref Range   Glucose-Capillary 209 (H) 70 - 99 mg/dL    Comment: Glucose reference range applies only to samples taken after fasting for at least 8 hours.  Glucose, capillary     Status: Abnormal   Collection Time: 08/27/19  4:31 PM  Result Value Ref Range   Glucose-Capillary 165 (H) 70 - 99 mg/dL    Comment: Glucose reference range applies only to samples taken after fasting for at least 8 hours.  Glucose, capillary     Status: Abnormal   Collection Time: 08/27/19  9:21 PM  Result Value Ref Range   Glucose-Capillary 213 (H) 70 - 99 mg/dL    Comment: Glucose reference range applies only to samples taken after fasting for at least 8 hours.  Glucose, capillary     Status: Abnormal   Collection Time: 08/27/19 10:39 PM  Result Value Ref Range   Glucose-Capillary 209 (H) 70 - 99 mg/dL    Comment: Glucose reference range applies only to samples taken after fasting for at least 8 hours.  Glucose, capillary     Status: Abnormal   Collection Time: 08/28/19  6:21 AM  Result Value Ref Range   Glucose-Capillary 153 (H) 70 - 99 mg/dL    Comment: Glucose reference range applies only to samples taken after fasting for at least 8 hours.   Comment 1 Notify RN   Glucose, capillary     Status: Abnormal   Collection Time: 08/28/19 11:03 AM  Result Value Ref Range   Glucose-Capillary 239 (H) 70 - 99 mg/dL    Comment: Glucose reference range applies only to samples taken after fasting for at least 8 hours.    Comment 1 Notify RN   Basic metabolic panel     Status: Abnormal   Collection Time: 08/28/19 11:56 AM  Result Value Ref Range   Sodium 132 (L) 135 - 145 mmol/L   Potassium 3.6 3.5 - 5.1 mmol/L   Chloride 99 98 - 111 mmol/L   CO2 23 22 - 32 mmol/L   Glucose, Bld 224 (H) 70 - 99 mg/dL    Comment: Glucose reference range applies only to samples taken after fasting for at least 8 hours.   BUN 9 8 - 23 mg/dL   Creatinine, Ser 0.87 0.61 - 1.24 mg/dL   Calcium 9.0 8.9 - 10.3 mg/dL   GFR calc non Af Amer >60 >60 mL/min   GFR calc Af Amer >60 >60 mL/min   Anion gap 10 5 - 15  Comment: Performed at [facility], [place] [address] [place], [place] [address]  CBC     Status: Abnormal   Collection Time: 08/28/19 11:56 AM  Result Value Ref Range   WBC 7.9 4.0 - 10.5 K/uL   RBC 4.68 4.22 - 5.81 MIL/uL   Hemoglobin 12.7 (L) 13.0 - 17.0 g/dL   HCT 38.2 (L) 39 - 52 %   MCV 81.6 80.0 - 100.0 fL   MCH 27.1 26.0 - 34.0 pg   MCHC 33.2 30.0 - 36.0 g/dL   RDW 12.8 11.5 - 15.5 %   Platelets 210 150 - 400 K/uL   nRBC 0.0 0.0 - 0.2 %    Comment: Performed at [facility], [place] [address], [place], [place] [address]  SARS CORONAVIRUS 2 (TAT 6-24 HRS) Nasopharyngeal Nasopharyngeal Swab     Status: None   Collection Time: 08/28/19 12:40 PM   Specimen: Nasopharyngeal Swab  Result Value Ref Range   SARS Coronavirus 2 NEGATIVE NEGATIVE    Comment: (NOTE) SARS-CoV-2 target nucleic acids are NOT DETECTED.  The SARS-CoV-2 RNA is generally detectable in upper and lower respiratory specimens during the acute phase of infection. Negative results do not preclude SARS-CoV-2 infection, do not rule out co-infections with other pathogens, and should not be used as the sole basis for treatment or other patient management decisions. Negative results must be combined with clinical observations, patient history, and epidemiological information. The expected result is Negative.  Fact Sheet for  Patients: SugarRoll.be  Fact Sheet for Healthcare Providers: https://www.woods-mathews.com/  This test is not yet approved or cleared by the [place] FDA and  has been authorized for detection and/or diagnosis of SARS-CoV-2 by FDA under an Emergency Use Authorization (EUA). This EUA will remain  in effect (meaning this test can be used) for the duration of the COVID-19 declaration under Se ction 564(b)(1) of the Act, 21 U.S.C. section 360bbb-3(b)(1), unless the authorization is terminated or revoked sooner.  Performed at [facility], [place] [address] [place], [place] [address]   Glucose, capillary     Status: Abnormal   Collection Time: 08/28/19  4:48 PM  Result Value Ref Range   Glucose-Capillary 241 (H) 70 - 99 mg/dL    Comment: Glucose reference range applies only to samples taken after fasting for at least 8 hours.   Comment 1 Notify RN   Glucose, capillary     Status: Abnormal   Collection Time: 08/29/19  6:17 AM  Result Value Ref Range   Glucose-Capillary 188 (H) 70 - 99 mg/dL    Comment: Glucose reference range applies only to samples taken after fasting for at least 8 hours.  Glucose, capillary     Status: Abnormal   Collection Time: 08/29/19 11:14 AM  Result Value Ref Range   Glucose-Capillary 223 (H) 70 - 99 mg/dL    Comment: Glucose reference range applies only to samples taken after fasting for at least 8 hours.   Comment 1 Notify RN   Glucose, capillary     Status: Abnormal   Collection Time: 08/29/19  4:17 PM  Result Value Ref Range   Glucose-Capillary 268 (H) 70 - 99 mg/dL    Comment: Glucose reference range applies only to samples taken after fasting for at least 8 hours.   Comment 1 Notify RN   Glucose, capillary     Status: Abnormal   Collection Time: 08/29/19  9:36 PM  Result Value Ref Range   Glucose-Capillary 155 (  H) 70 - 99 mg/dL    Comment: Glucose reference range applies only to samples taken after  fasting for at least 8 hours.  Glucose, capillary     Status: Abnormal   Collection Time: 08/30/19  6:10 AM  Result Value Ref Range   Glucose-Capillary 196 (H) 70 - 99 mg/dL    Comment: Glucose reference range applies only to samples taken after fasting for at least 8 hours.   Recent Results (from the past 240 hour(s))  SARS CORONAVIRUS 2 (TAT 6-24 HRS) Nasopharyngeal Nasopharyngeal Swab     Status: None   Collection Time: 08/28/19 12:40 PM   Specimen: Nasopharyngeal Swab  Result Value Ref Range Status   SARS Coronavirus 2 NEGATIVE NEGATIVE Final    Comment: (NOTE) SARS-CoV-2 target nucleic acids are NOT DETECTED.  The SARS-CoV-2 RNA is generally detectable in upper and lower respiratory specimens during the acute phase of infection. Negative results do not preclude SARS-CoV-2 infection, do not rule out co-infections with other pathogens, and should not be used as the sole basis for treatment or other patient management decisions. Negative results must be combined with clinical observations, patient history, and epidemiological information. The expected result is Negative.  Fact Sheet for Patients: SugarRoll.be  Fact Sheet for Healthcare Providers: https://www.woods-mathews.com/  This test is not yet approved or cleared by the [place] FDA and  has been authorized for detection and/or diagnosis of SARS-CoV-2 by FDA under an Emergency Use Authorization (EUA). This EUA will remain  in effect (meaning this test can be used) for the duration of the COVID-19 declaration under Se ction 564(b)(1) of the Act, 21 U.S.C. section 360bbb-3(b)(1), unless the authorization is terminated or revoked sooner.  Performed at [facility], [place] [address] [place], [place] [address]    Creatinine: Recent Labs    08/24/19 3716 08/25/19 0429 08/28/19 1156  CREATININE 1.15 0.99 0.87    Xrays: See report/chart As  noted  Impression/Assessment:  Clinically patient has right epididymoorchitis secondary to Foley catheter.  Based upon allergies we gave him Keflex 500 mg 3 times a day for 10 days.  We talked about Mobic 15 mg a day for 2 weeks the primary team will weigh benefits and risks with his current cardiac status.  I will check renal ultrasound to make certain diagnosis does not change.  Otherwise he will follow-up with Dr. Tresa Moore as plan for apparently renal cell carcinoma on left side  Plan:  As noted  Alexis Griffith A Adalina Dopson 08/30/2019, 11:00 AM

## 2019-08-30 NOTE — TOC Progression Note (Signed)
Transition of Care [facility]) - Progression Note    Patient Details  Name: Alexis Griffith MRN: [id number] Date of Birth: [birth year]  Transition of Care [facility]) CM/SW [place], [place] Phone Number: 08/30/2019, 12:22 PM  Clinical Narrative:     CSW advised SNF patient will not discharge today.  Thurmond Butts, MSW, LCSWA Clinical Social Worker     Barriers to Discharge: Ship broker, Continued Medical Work up, SNF Pending bed offer  Expected Discharge Plan and Services   In-house Referral: Clinical Social Work     Living arrangements for the past 2 months: Single Family Home                                       Social Determinants of Health (SDOH) Interventions    Readmission Risk Interventions No flowsheet data found.

## 2019-08-30 NOTE — Progress Notes (Signed)
Patient c/o pain to scrotum. Upon assessment pt scrotum seems to be acutely red, swollen, bruised, taut and tender. Through out the shift, the area gradually has increased in redness but changes are subtle.  Patient became aware of changes while ambulating with PT but did not realize the discomfort was coming from his scrotum specifically. During the night is was when he realized it was difficult for him to sit without causing pain to the area. Patient does not recall specifically injuring or trauma occurring to the area but more so a gradually increase in discomfort.  RN notified Fellow Carlise of assessment findings.   CT on 6/29 of ABD available, with scrotum also visible, but imaging completed prior to c/o discomfort.

## 2019-08-30 NOTE — Progress Notes (Signed)
PT Cancellation Note  Patient Details Name: Alexis Griffith MRN: [id number] DOB: 1940-07-18   Cancelled Treatment:    Reason Eval/Treat Not Completed: Patient declined, no reason specified.  Has a pending [place] and did not want to move from the bed.  Re-attempt as time and pt allow.   Ramond Dial 08/30/2019, 12:15 PM   Mee Hives, PT MS Acute Rehab Dept. Number: [place] and [place]

## 2019-08-30 NOTE — Consult Note (Addendum)
Cardiology Consultation:   Patient ID: MAAZ SPIERING MRN: [id number]; DOB: [birth year]  Admit date: 08/15/2019 Date of Consult: 08/30/2019  Primary Care Provider: Haywood Pao, MD [facility] Cardiologist: Mertie Moores, MD  Structural Heart (new), [facility] Electrophysiologist:  None    Patient Profile:   Alexis Griffith is a 79 y.o. male with a hx of HTN, DM, OSA, TIA, CAD, PVCs, HLD, squamous Cell ca tongue (resected), Giant cell arteritis (2013), Renal Ca (presumed, pending further w/u), moderate carotid disease b/l,  and severe AS who is being seen today for the evaluation of persistent orthostatic intolerance at the request of Dr. Burt Knack  History of Present Illness:   [name] had c/o CP earlier this year, w/u lead to cath/echo, he was found with severe AS and multivessel CAD. Ultimately referred to CTS service with their pre-TAVR evaluation by CT noted renal mass most c/w cancer. Recommendation was to pursue TAVR, he had not had more CP on Imdur and not planned for coronary intervention unless further angina (cath noted below) and then pursue urology and likely nephrectomy afterwards  He was admitted to [facility] 08/15/2019 for TAVR.  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 6/18 he was noted to be orthostatic and subsequent syncope all of his antihypertensives held. Also complicated stay early with urinary retention and hematuria, his doxazosin continued.  Developed some tachycardia off his BB and this was resumed low dose Significant orthostatic hypotension continued,  08/20/19 visit 130/81mmHg lying to 95/12mmHg standing and HR increases from 85>119bpm, TED hose and Abd binder ordered, felt midodrine not a great option with HTN hx and Florinef as well, avoided w/hx of CHF 08/22/19, symptomatic  orthostatic hypotension continued, midodrine was added 6/24 started with marked nausea, belly pain, leukocytosis, UA obtained and started on antibiotics >> recurrent urinary retention requiring coude cath placed, Tamsulosin added to his doxazosin 6/25 significant orthostatic drop in SBP (130 sitting - 107 standing) w compression stockings, abdominal binder, midodrine, but no symptoms of dizziness 6/27 Not wearing the abdominal binder, but has compression stockings - no change in BP with standing (after 5 minutes 139/65), although HR does increase to almost 120. 6/28 constipated, remained with orthostatic and ambulatory Sinus Tach better BPs, no pre syncope or syncope in a week  Yesterday Syncope during orthostatic vitals, BP dropped from 110/56 --> 70/56. Notes remark he has had poor PO intake and encouraged to do better, florinef added, tamsulosin stopped   The pt denies any kind of orthostatic hx previously, no syncope.  He has not been having any CP or SOB.     Past Medical History:  Diagnosis Date  . Anxiety   . CAD (coronary artery disease)   . Carotid artery disease ([place]) 08/24/2011  . Coronary artery disease involving native coronary artery of native heart with angina pectoris ([place]) 07/04/2019  . Diabetes mellitus without complication (Alexis Griffith)   . Excessive urination at night 06/05/2013  . Giant cell arteritis (Alexis Griffith) 05/17/2012  . Hyperlipidemia   . Hypertension   . PVC (premature ventricular contraction)   . Renal mass   . 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 Burt Knack & Dr. Cyndia Bent  . Severe aortic stenosis   . Sinus bradycardia on  ECG   . Sleep apnea   . 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 Wilburn Griffith.  . Stroke (Alexis Griffith)   . TIA (transient ischemic attack)     Past Surgical History:  Procedure Laterality Date  . CATARACT EXTRACTION, BILATERAL  10/2015, and 11/2015   with  adding  TORIC lenses bilaterally   . INTRAVASCULAR PRESSURE WIRE/FFR STUDY N/A 07/07/2019   Procedure: INTRAVASCULAR PRESSURE WIRE/FFR STUDY;  Surgeon: Alexis Man, MD;  Location: [place] CV LAB;  Service: Cardiovascular;  Laterality: N/A;  . LEFT HEART CATH AND CORONARY ANGIOGRAPHY N/A 07/07/2019   Procedure: LEFT HEART CATH AND CORONARY ANGIOGRAPHY;  Surgeon: Alexis Man, MD;  Location: [place] CV [facility];  Service: Cardiovascular;  Laterality: N/A;  . SQUAMOUS CELL CARCINOMA EXCISION     tongue  . TEE WITHOUT CARDIOVERSION N/A 08/15/2019   Procedure: TRANSESOPHAGEAL ECHOCARDIOGRAM (TEE);  Surgeon: Alexis Mocha, MD;  Location: [address];  Service: Open Heart Surgery;  Laterality: N/A;  . TRANSCATHETER AORTIC VALVE REPLACEMENT, TRANSFEMORAL N/A 08/15/2019   Procedure: TRANSCATHETER AORTIC VALVE REPLACEMENT, TRANSFEMORAL using Edwards [employer] SAPIEN 3 Ultra 26 mm THV.;  Surgeon: Alexis Mocha, MD;  Location: [place];  Service: Open Heart Surgery;  Laterality: N/A;     Home Medications:  Prior to Admission medications   Medication Sig Start Date End Date Taking? Authorizing Provider  acetaminophen (TYLENOL) 500 MG tablet Take 1,000 mg by mouth every 6 (six) hours as needed for moderate pain.   Yes [provider]  carboxymethylcellulose (REFRESH PLUS) 0.5 % SOLN Place 1 drop into both eyes 2 (two) times daily as needed (dry eyes).   Yes [provider]  Choline Fenofibrate (TRILIPIX) 135 MG capsule Take 135 mg by mouth daily.     Yes [provider]  clonazePAM (KLONOPIN) 0.5 MG tablet Take 0.5 mg by mouth 4 (four) times daily.     Yes [provider]  Coenzyme Q10 200 MG capsule Take 200 mg by mouth daily.   Yes [provider]  dipyridamole-aspirin (AGGRENOX) 200-25 MG 12hr capsule Take 1 capsule by mouth 2 (two) times daily. 08/30/17  Yes Alexis Fila, MD  doxazosin (CARDURA) 4 MG tablet Take 4 mg by mouth at bedtime.     Yes [provider]  glucose blood test strip 1 each by Other route as needed. 05/12/12  Yes [provider]  isosorbide mononitrate (IMDUR) 30 MG 24 hr tablet Take 1 tablet (30 mg total) by mouth daily. 07/07/19 07/06/20 Yes Alexis Man, MD  Lancets (ONETOUCH DELICA PLUS [id number]) [place]  03/23/18  Yes [provider]  LANTUS SOLOSTAR 100 UNIT/ML Solostar Pen Inject 65 Units into the skin daily.  03/19/15  Yes [provider]  losartan-hydrochlorothiazide (HYZAAR) 100-25 MG tablet Take 1 tablet by mouth daily. 05/24/19  Yes Nahser, Wonda Cheng, MD  metoprolol tartrate (LOPRESSOR) 50 MG tablet TAKE 1 TABLET BY MOUTH 3 TIMES A DAY. Patient taking differently: Take 50 mg by mouth 3 (three) times daily.  06/29/19  Yes Nahser, Wonda Cheng, MD  ONE TOUCH ULTRA TEST test strip  05/12/12  Yes [provider]  rosuvastatin (CRESTOR) 5 MG tablet Take 1 tablet (5 mg total) by mouth daily. 06/23/19  Yes Nahser, Wonda Cheng, MD  sitaGLIPtin (JANUVIA) 100 MG tablet Take 1 tablet (100 mg total) by mouth daily. 06/26/19  Yes Nahser, Wonda Cheng, MD    Inpatient Medications: Scheduled Meds: . aspirin  81 mg Oral Daily  . Chlorhexidine Gluconate Cloth  6 each Topical Daily  . ciprofloxacin  500 mg Oral BID  . clonazePAM  0.5 mg Oral QID  . fludrocortisone  0.1 mg Oral Daily  . insulin aspart  0-24 Units Subcutaneous TID AC & HS  . insulin aspart  5 Units Subcutaneous BID WC  . insulin glargine  18 Units Subcutaneous Daily  . magnesium hydroxide  5 mL Oral Daily  . metoprolol tartrate  12.5 mg Oral BID  . midodrine  5 mg Oral TID WC  . polyethylene glycol  17 g Oral Daily  . rosuvastatin  5 mg Oral Daily  . sodium chloride flush  3 mL Intravenous Q12H   Continuous Infusions: . sodium chloride 20 mL/hr at 08/17/19 2131   PRN Meds: sodium chloride, acetaminophen **OR** acetaminophen, bismuth subsalicylate, Glycerin (Adult), morphine injection, ondansetron (ZOFRAN) IV, oxyCODONE, sodium  chloride flush, traMADol, zolpidem  Allergies:    Allergies  Allergen Reactions  . Macrodantin [Nitrofurantoin] Anaphylaxis    blocked kidneys   . Sulfa Antibiotics Rash  . Lisinopril Cough  . Penicillins     Unknown    Social History:   Social History   Socioeconomic History  . Marital status: Married    Spouse name: Not on file  . Number of children: 2  . Years of education: Not on file  . Highest education level: Not on file  Occupational History  . Not on file  Tobacco Use  . Smoking status: Former Smoker    Quit date: 06/25/1975    Years since quitting: 44.2  . Smokeless tobacco: Never Used  Vaping Use  . Vaping Use: Never used  Substance and Sexual Activity  . Alcohol use: No  . Drug use: No  . Sexual activity: Yes  Other Topics Concern  . Not on file  Social History Narrative   Patient lives at home with his wife.    Social Determinants of Health   Financial Resource Strain:   . Difficulty of Paying Living Expenses:   Food Insecurity:   . Worried About Charity fundraiser in the Last Year:   . Arboriculturist in the Last Year:   Transportation Needs:   . Film/video editor (Medical):   Marland Kitchen Lack of Transportation (Non-Medical):   Physical Activity:   . Days of Exercise per Week:   . Minutes of Exercise per Session:   Stress:   . Feeling of Stress :   Social Connections:   . Frequency of Communication with Friends and Family:   . Frequency of Social Gatherings with Friends and Family:   . Attends Religious Services:   . Active Member of Clubs or Organizations:   . Attends Archivist Meetings:   Marland Kitchen Marital Status:   Intimate Partner Violence:   . Fear of Current or Ex-Partner:   . Emotionally Abused:   Marland Kitchen Physically Abused:   . Sexually Abused:     Family History:   Family History  Problem Relation Age of Onset  . Heart disease Mother   . Heart attack Mother   . Heart disease Father   . Heart attack Father      ROS:  Please  see the history of present illness.  All other ROS reviewed and negative.     Physical Exam/Data:   Vitals:   08/30/19 0029 08/30/19 0414 08/30/19 0735 08/30/19 0903  BP: (!) 145/81 (!) 155/64 (!) 160/66   Pulse: Marland Kitchen)  102 96 90   Resp: 20 (!) 24 19   Temp: 98.3 F (36.8 C) 98.1 F (36.7 C) 97.9 F (36.6 C)   TempSrc: Oral Oral Oral   SpO2: 94% 98% 96%   Weight:    86.5 kg  Height:        Intake/Output Summary (Last 24 hours) at 08/30/2019 1049 Last data filed at 08/30/2019 0915 Gross per 24 hour  Intake 840 ml  Output 4675 ml  Net -3835 ml   Last 3 Weights 08/30/2019 08/29/2019 08/28/2019  Weight (lbs) 190 lb 11.2 oz 174 lb 6.1 oz 189 lb 1.6 oz  Weight (kg) 86.5 kg 79.1 kg 85.775 kg     Body mass index is 27.36 kg/m.  General:  Well nourished, well developed, in no acute distress HEENT: normal Lymph: no adenopathy Neck: no JVD Endocrine:  No thryomegaly Vascular: No carotid bruits Cardiac:   RRR; no murmurs, gallops or rubs Lungs:  CTA b/l, no wheezing, rhonchi or rales  Abd: soft, nontender, abd binder in place  Ext: no edema, compression stockings b/l Musculoskeletal:  No deformities Skin: warm and dry  Neuro:  No gross focal abnormalities noted Psych:  Normal affect  Foley in place  EKG:  The EKG was personally reviewed and demonstrates:   SR 98bpm, 1st degree AVblock, PR 262ms  Telemetry:  Telemetry was personally reviewed and demonstrates:   SR, ST 70's 130's He has some intermittent IVCD  Relevant CV Studies:  08/16/2019: TTE IMPRESSIONS  1. Left ventricular ejection fraction, by estimation, is 55 to 60%. The  left ventricle has normal function. The left ventricle demonstrates  regional wall motion abnormalities (see scoring diagram/findings for  description). There is mild concentric left  ventricular hypertrophy. Indeterminate diastolic filling due to E-A  fusion.  2. Right ventricular systolic function is normal. The right ventricular  size is  normal.  3. The mitral valve is normal in structure. No evidence of mitral valve  regurgitation. No evidence of mitral stenosis.  4. The aortic valve is normal in structure. Aortic valve regurgitation is  not visualized. No aortic stenosis is present. Aortic valve mean gradient  measures 11.7 mmHg. Aortic valve Vmax measures 2.48 m/s.  5. The inferior vena cava is normal in size with greater than 50%  respiratory variability, suggesting right atrial pressure of 3 mmHg.     07/07/2019: LHC  Ost LM lesion is 30% stenosed.  Prox LAD to Mid LAD lesion is 70% stenosed with 70% stenosed side branch in 2nd Diag. Both vessels positive by CT FFR  Mid LAD to Dist LAD lesion is 40% stenosed. Dist LAD lesion is 75% stenosed.  Prox Cx lesion is 45% stenosed. Prox Cx to Mid Cx lesion is 75% stenosed. RFR positive is 0.87  Mid Cx lesion is 5% stenosed with 50% stenosed side branch in 3rd Mrg. No significant drop in RFR reading beyond this lesion.  Ost RCA to Dist RCA lesion is 100% stenosed. Significant left to right collaterals fill the PDA and PL system.  RPDA lesion is 50% stenosed.  --------------------------------------  The left ventricular systolic function is normal. The left ventricular ejection fraction is 55-65% by visual estimate. Regional wall motion knowledged and inferior wall.  LV end diastolic pressure is moderately elevated.  There is ~SEVERE AORTIC VALVE STENOSIS.     Laboratory Data:  High Sensitivity Troponin:  No results for input(s): TROPONINIHS in the last 720 hours.   Chemistry Recent Labs  Lab 08/24/19 [id number] 08/25/19  7654 08/28/19 1156  NA 134* 133* 132*  K 4.2 3.7 3.6  CL 98 100 99  CO2 26 21* 23  GLUCOSE 170* 170* 224*  BUN 23 18 9   CREATININE 1.15 0.99 0.87  CALCIUM 9.1 8.6* 9.0  GFRNONAA >60 >60 >60  GFRAA >60 >60 >60  ANIONGAP 10 12 10     No results for input(s): PROT, ALBUMIN, AST, ALT, ALKPHOS, BILITOT in the last 168  hours. Hematology Recent Labs  Lab 08/24/19 0349 08/25/19 0429 08/28/19 1156  WBC 14.7* 10.5 7.9  RBC 4.53 4.26 4.68  HGB 12.6* 11.8* 12.7*  HCT 38.0* 35.5* 38.2*  MCV 83.9 83.3 81.6  MCH 27.8 27.7 27.1  MCHC 33.2 33.2 33.2  RDW 12.8 13.0 12.8  PLT 163 155 210   BNPNo results for input(s): BNP, PROBNP in the last 168 hours.  DDimer No results for input(s): DDIMER in the last 168 hours.   Radiology/Studies:  No results found.   Assessment and Plan:   1. New onset and marked orthostatic hypotension associated with syncope   Uncertain, though possibly 2/2 post procedure CVA, if so, suspect will improve hopefully resolve  Agree with compression stockings, at least 21mmHg, (ideally just thigh sleeves once out patient) Supine HTN > abdominal binder only when going to get OOB and while OOB                         Stop florinef, start mestinon Make midodrine TID Q 4-5hrs during day Keep bed in reverse trendelenburg OOB to chair as much as tolerated/able  We will see in the morning      For questions or updates, please contact [facility] Please consult [email or web address] for contact info under    Signed, Baldwin Jamaica, PA-C  08/30/2019 10:49 AM  Orthostatic hypotension-- new severe  Hypertension-supine  Stroke post TAVR  TAVR/AS  Diabetes x >=15 yrs  Giant cell arteritis  TIA  Renal Ca  Presumed with anticipated need for resection   The patient has profound post TAVR orthostasis.  This occurs in the context of longstanding diabetes and aortic stenosis which suggest underlying autonomic neuropathy and/or amyloid.  The acute onset of his symptoms following TAVR and the imaging finding of a stroke also raises the possibility that the stroke is related.  A brief literature search demonstrates a high frequency of orthostatic intolerance, and not infrequently severe orthostatic intolerance, associated with acute stroke.  Over 2 to 3 months of follow-up, more  than half resolve in another 30% get significantly better.  About a quarter were unchanged at 3 months.  Frequently this is associated with vascular disease, although I do not find any association up-to-date with giant cell arteritis, and diabetes.  He has supine hypertension which limits options that would increase his blood pressure while supine, specifically things like fludrocortisone which have a long effect and ProAmatine taken while supine.  Mestinon has been used in this cohort as it to prevents the degradation of the neurotransmitters released with standing.  It may also help his urinary retention.:))  And may be even his constipation,:)  Hence, from a therapeutic point of view I would recommend #1-out of bed is much as possible #2-bed in reverse Trendelenburg 15-20 degrees #3-discontinue fludrocortisone #4-begin Mestinon 30 mg twice daily #5-dose ProAmatine every 4 hours upon arising in the morning.  Not to be taken while he is supine #6-abdominal binder as he has but necessary only when he is not  supine #7-when he goes home his thigh-high support stockings will probably be unmanageable.  Thigh sleeves can be obtained on the Internet and are much more effective than calf stockings.

## 2019-08-30 NOTE — Progress Notes (Addendum)
Progress Note  Patient Name: Alexis Griffith Date of Encounter: 08/30/2019  [facility] Cardiologist: Mertie Moores, MD / Dr. Burt Knack & Dr. Cyndia Bent (TAVR)  Subjective   Had a good BM. Has pain in right testicle. Has noticed tenderness when walking over past couple days but pain got so bad yesterday he asked for pain meds. No more syncopal episodes  Inpatient Medications    Scheduled Meds: . aspirin  81 mg Oral Daily  . Chlorhexidine Gluconate Cloth  6 each Topical Daily  . ciprofloxacin  500 mg Oral BID  . clonazePAM  0.5 mg Oral QID  . fludrocortisone  0.1 mg Oral Daily  . insulin aspart  0-24 Units Subcutaneous TID AC & HS  . insulin aspart  5 Units Subcutaneous BID WC  . insulin glargine  18 Units Subcutaneous Daily  . magnesium hydroxide  5 mL Oral Daily  . metoprolol tartrate  12.5 mg Oral BID  . midodrine  5 mg Oral TID WC  . polyethylene glycol  17 g Oral Daily  . rosuvastatin  5 mg Oral Daily  . sodium chloride flush  3 mL Intravenous Q12H   Continuous Infusions: . sodium chloride 20 mL/hr at 08/17/19 2131   PRN Meds: sodium chloride, acetaminophen **OR** acetaminophen, bismuth subsalicylate, Glycerin (Adult), morphine injection, ondansetron (ZOFRAN) IV, oxyCODONE, sodium chloride flush, traMADol, zolpidem   Vital Signs    Vitals:   08/29/19 2002 08/30/19 0029 08/30/19 0414 08/30/19 0735  BP: (!) 154/62 (!) 145/81 (!) 155/64 (!) 160/66  Pulse: 89 (!) 102 96 90  Resp: 20 20 (!) 24 19  Temp: 98.1 F (36.7 C) 98.3 F (36.8 C) 98.1 F (36.7 C) 97.9 F (36.6 C)  TempSrc: Oral Oral Oral Oral  SpO2: 99% 94% 98% 96%  Weight:      Height:        Intake/Output Summary (Last 24 hours) at 08/30/2019 0857 Last data filed at 08/30/2019 0600 Gross per 24 hour  Intake 840 ml  Output 4700 ml  Net -3860 ml   Last 3 Weights 08/29/2019 08/28/2019 08/27/2019  Weight (lbs) 174 lb 6.1 oz 189 lb 1.6 oz 189 lb 14.4 oz  Weight (kg) 79.1 kg 85.775 kg 86.138 kg       Telemetry    NSR-Personally Reviewed  ECG    No new tracing - Personally Reviewed  Physical Exam   GEN: No acute distress.   Neck: No JVD Cardiac: RRR, soft flow murmurs. No rubs, or gallops.  Respiratory: Clear to auscultation bilaterally. GI: Soft, nontender, non-distended + bowel sounds MS: No edema; No deformity. Neuro:  Nonfocal  Skin: right testicle with swelling, redness, erythema, rubor and pain.  Psych: Normal affect   Labs    High Sensitivity Troponin:  No results for input(s): TROPONINIHS in the last 720 hours.    Chemistry Recent Labs  Lab 08/24/19 0349 08/25/19 0429 08/28/19 1156  NA 134* 133* 132*  K 4.2 3.7 3.6  CL 98 100 99  CO2 26 21* 23  GLUCOSE 170* 170* 224*  BUN 23 18 9   CREATININE 1.15 0.99 0.87  CALCIUM 9.1 8.6* 9.0  GFRNONAA >60 >60 >60  GFRAA >60 >60 >60  ANIONGAP 10 12 10      Hematology Recent Labs  Lab 08/24/19 0349 08/25/19 0429 08/28/19 1156  WBC 14.7* 10.5 7.9  RBC 4.53 4.26 4.68  HGB 12.6* 11.8* 12.7*  HCT 38.0* 35.5* 38.2*  MCV 83.9 83.3 81.6  MCH 27.8 27.7 27.1  MCHC 33.2 33.2 33.2  RDW 12.8 13.0 12.8  PLT 163 155 210    BNPNo results for input(s): BNP, PROBNP in the last 168 hours.   DDimer No results for input(s): DDIMER in the last 168 hours.   Radiology    No results found.  Cardiac Studies   TAVR OPERATIVE NOTE   Date of Procedure:08/15/2019  Preoperative Diagnosis:Severe Aortic Stenosis   Postoperative Diagnosis:Same   Procedure:   Transcatheter Aortic Valve Replacement - PercutaneousRightTransfemoral Approach Edwards Sapien 3 Ultra THV (size 56mm, model # [id number], serial # [id number])  Co-Surgeons:Bryan Alveria Apley, MD and Sherren Mocha, MD   Anesthesiologist:Carswell Glennon Mac, MD  Echocardiographer:M. Croitoru, MD  Pre-operative Echo Findings: ? Severe aortic  stenosis ? Normalleft ventricular systolic function  Post-operative Echo Findings: ? NOparavalvular leak ? Normalleft ventricular systolic function   _________________    Echo 08/16/19:  IMPRESSIONS  1. Left ventricular ejection fraction, by estimation, is 55 to 60%. The  left ventricle has normal function. The left ventricle demonstrates  regional wall motion abnormalities (see scoring diagram/findings for  description). There is mild concentric left ventricular hypertrophy. Indeterminate diastolic filling due to E-A fusion.  2. Right ventricular systolic function is normal. The right ventricular  size is normal.  3. The mitral valve is normal in structure. No evidence of mitral valve  regurgitation. No evidence of mitral stenosis.  4. The aortic valve is normal in structure. Aortic valve regurgitation is  not visualized. No aortic stenosis is present. Aortic valve mean gradient  measures 11.7 mmHg. Aortic valve Vmax measures 2.48 m/s.  5. The inferior vena cava is normal in size with greater than 50%  respiratory variability, suggesting right atrial pressure of 3 mmHg.   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 No significant chronic ischemic change.  _______________  Ct angio head/neck 08/16/19 IMPRESSION: 1. Progressive cervical carotid atherosclerosis with 75% right and 65-70% left proximal ICA stenoses. 2. 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 3. New moderate right and mild left M1 stenoses. 4. Unchanged mild left intracranial ICA stenosis. 5. Aortic Atherosclerosis (ICD10-I70.0).  Patient Profile     79 y.o. male with a history of HTN, HLD, DMT2, OSA, history of CVA, multivessel CAD, recently diagnosed renal cell carcinoma and severe AS who presented to [facility] on 08/15/19 for planned TAVR.  Assessment & Plan    Severe AS:s/p  successful TAVR with a 26 mm Edwards Sapien 3 THV via the TF approach on 08/15/19. Post operative echo showed EF 55%, normally functioning TAVR with a mean gradient of 11.7 mm hg and no PVL. No evidence of HAVB. Plavix now discontinued with gross hematuria. Prolonged admission due to issues with acute urinary retention, hematuria and orthostatic hypotension with recurrent syncope. Pending insurance approval at [employer] in [employer]. Covid test negative. Hopeful discharge in next couple days.  Acute on chronic diastolic CHF: appears euvolemic after TAVR, excellent UO even though diuretics were stopped due to orthostatic hypotension.  AKI: creat peaked at 1.74. Now resolved.   Cerebrovascular disease:asymptomatic at this time. MR brain showed a4 mm acute infarct left medial frontalcortex, which was felt to be incidentaland procedural related.CTA of the head and neck show moderate 65 to 75% bilateral ICA stenoses but will require continued surveillance and outpatient follow-up.  Orthostatic hypotension with recurrent syncope: all BP meds were held (except very low dose metoprolol which was added back due to rebound  tachycardia). Doxazosin switched to tamsulosin, then tamsulosin discontinued. Compression stockings and abdominal binder placed. Started on midodrine 5mg  TID and now Florinef 0.1mg  daily. Dr. Caryl Comes to see today.   HTN: tolerate SBP up to 160, at least for now; allowing for permissive supine hypertension given significant orthostasis.   DMT2: continue SSI  Acute urinary retention with hematuria:had coude cath inserted then removed. Now coude cath replaced given recurrent acute urinary retention. Had gross hematuria that has resolved since discontinuing plavix.Doxazosin switched to tamsulosin given significant orthostasis.Discussed with Dr Jeffie Pollock with urology 6/28. He recommended keeping in Coude cath at discharge and consider a voiding trial with Dr. Tresa Moore when he sees him on  7/12 @ 1pm  UTI:no culture obtained. Started on Cipro and white count and fever have resolved. CT abdomen unremarkable. Suspect abdominal pain 2/2 UTI and acute urinary retention.  Swollen testicle: pt has developed an erythematous, swollen, painful right testicle. Discussed with Dr. Virgel Gess who asked [place] to obtain a urine culture and scrotal ultrasound which are pending. Per urology, this is felt to be epididymoorchitis secondary to Foley catheter. Based upon allergies we gave him Keflex 500 mg 3 times a day for 10 days and  Mobic 15 mg a day for 2 weeks. Will stop Cipro now and a short trial of Mobic.  RCC: pre TAVR CT showed a large RCC and pulmonary nodules. Follow up PET scan did not show evidence of metastatic disease. He has an apt with Dr. Tresa Moore with urologyon 6/29 which I have moved out to 7/12 @1  pm.   Pulmonary nodules: PET scan showed a dominant right lower lobe 1.2 cm solid pulmonary nodule is non-hypermetabolic. Additional small solid subcentimeter pulmonary nodules in both lungs are below PET resolution and warrant attention on follow-up chest CT in 3 months. This will be followed over time.    Signed, Angelena Form, PA-C  08/30/2019, 8:57 AM    Patient seen, examined. Available data reviewed. Agree with findings, assessment, and plan as outlined by Nell Range, PA-C. Exam unchanged with the exception of erythema and edema of the right scrotal area with associated tenderness. Lungs clear, heart RRR 2/6 SEM at the RUSB, extremities with no edema. Appreciate Urology consultation - personally d/w Dr McDiarmid. Plan Keflex 500 mg TID, anti-inflammatory Rx short term with meloxicam, and scrotal US/urine culture. Orthostatics improved. Appreciate Dr Olin Pia evaluation. Otherwise continue current Rx. Pending test results consider DC to SNF tomorrow.  Sherren Mocha, M.D. 08/30/2019 12:26 PM

## 2019-08-30 NOTE — Progress Notes (Signed)
Mobility Specialist - Progress Note   08/30/19 1359  Mobility  Activity Ambulated in hall  Level of Assistance Modified independent, requires aide device or extra time  Assistive Device Front wheel walker  Distance Ambulated (ft) 270 ft  Mobility Response Tolerated well  Mobility performed by Mobility specialist  $Mobility charge 1 Mobility    Pre-mobility:   Sitting: 76 HR, 168/64 BP, 98% SpO2  Standing: 88 HR, 138/55 BP, 99% SpO2  Standing at 3 min: 85 HR, 142/55 BP, 98% SpO2 During mobility: 92 HR, 152/70 BP, 100% SpO2 Post-mobility: 81 HR, 154/58 BP, 99% SpO2  Aamirah Salmi Transport planner Phone: [phone]

## 2019-08-31 ENCOUNTER — Other Ambulatory Visit: Payer: Self-pay | Admitting: Physician Assistant

## 2019-08-31 LAB — GLUCOSE, CAPILLARY
Glucose-Capillary: 160 mg/dL — ABNORMAL HIGH (ref 70–99)
Glucose-Capillary: 242 mg/dL — ABNORMAL HIGH (ref 70–99)
Glucose-Capillary: 186 mg/dL — ABNORMAL HIGH (ref 70–99)

## 2019-08-31 MED ORDER — PYRIDOSTIGMINE BROMIDE 60 MG PO TABS: 30.0000 mg | ORAL_TABLET | Freq: Two times a day (BID) | ORAL | 2 refills | Status: DC

## 2019-08-31 MED ORDER — FOSFOMYCIN TROMETHAMINE 3 G PO PACK
3.0000 g | PACK | ORAL | Status: DC
  Administered 2019-08-31: 3 g via ORAL
  Filled 2019-08-31: qty 3

## 2019-08-31 MED ORDER — FOSFOMYCIN TROMETHAMINE 3 G PO PACK: 3.0000 g | PACK | ORAL | 0 refills | Status: DC

## 2019-08-31 MED ORDER — ASPIRIN 81 MG PO CHEW: 81.0000 mg | CHEWABLE_TABLET | Freq: Every day | ORAL | Status: DC

## 2019-08-31 MED ORDER — MIDODRINE HCL 5 MG PO TABS: 5.0000 mg | ORAL_TABLET | Freq: Four times a day (QID) | ORAL | 3 refills | Status: DC

## 2019-08-31 MED ORDER — METOPROLOL TARTRATE 25 MG PO TABS: 12.5000 mg | ORAL_TABLET | Freq: Two times a day (BID) | ORAL | 6 refills | Status: DC

## 2019-08-31 MED ORDER — MELOXICAM 15 MG PO TABS: 15.0000 mg | ORAL_TABLET | Freq: Every day | ORAL | 0 refills | Status: DC

## 2019-08-31 NOTE — TOC Transition Note (Signed)
Transition of Care [facility]) - CM/SW Discharge Note   Patient Details  Name: Alexis Griffith MRN: [id number] Date of Birth: [birth year]  [facility] CM/SW Contact:  Vinie Sill, [place] Phone Number: 08/31/2019, 12:37 PM   Clinical Narrative:     Patient will DC to: [place] Date: 08/31/2019 Family Notified: Nancy,spouse Transport By: Corey Harold   Per MD patient is ready for discharge. RN, patient, and facility notified of DC. Discharge Summary sent to facility. RN given number for [phone], Room 205. Ambulance transport requested for patient.   Clinical Social Worker signing off.  Thurmond Butts, MSW, [place] Clinical Social Worker    Final next level of care: Skilled Nursing Facility Barriers to Discharge: Barriers Resolved   Patient Goals and CMS Choice        Discharge Placement              Patient chooses bed at: [place], [place] Patient to be transferred to facility by: [place] Name of family member notified: Nancy,spouse Patient and family notified of of transfer: 08/31/19  Discharge Plan and Services In-house Referral: Clinical Social Work                                   Social Determinants of Health (SDOH) Interventions     Readmission Risk Interventions No flowsheet data found.

## 2019-08-31 NOTE — Progress Notes (Signed)
CARDIAC REHAB PHASE I   PRE:  Rate/Rhythm: 84 SR  BP:  Supine:   Sitting: 129/60  Standing: 129/56   SaO2: 99%RA  MODE:  Ambulation: 300 ft   POST:  Rate/Rhythm: 96 SR  BP:  Supine:   Sitting: 139/60 hall Standing:     [id number] Pt walked 300 ft on RA with gait belt use and rolling walker and tolerated well. Took BP in hallway at 139/60 standing. Assisted to bathroom after walk. To recliner after assisting in bathroom. Pt for discharge today. Encouraged him to continue IS. Not interested in CRP 2 [place] at this time. No dizziness during encounter.    Graylon Good, RN BSN  08/31/2019 10:43 AM

## 2019-08-31 NOTE — Discharge Summary (Addendum)
[facility] VALVE TEAM  Discharge Summary    Patient ID: Alexis Griffith MRN: [id number]; DOB: [birth year]  Admit date: 08/15/2019 Discharge date: 08/31/2019  Primary Care Provider: Haywood Pao, MD  Primary Cardiologist: Mertie Moores, MD / Dr. Burt Knack & Dr. Cyndia Bent (TAVR)  Discharge Diagnoses    Principal Problem:   S/P TAVR (transcatheter aortic valve replacement) Active Problems:   PVC (premature ventricular contraction)   Hypertension   Hyperlipidemia   Carotid artery disease (HCC)   TIA (transient ischemic attack)   Coronary artery disease involving native coronary artery of native heart with angina pectoris ([place])   Acute on chronic diastolic heart failure ([place])   Diabetes mellitus without complication (HCC)   Severe aortic stenosis   Sleep apnea   Renal mass   Allergies Allergies  Allergen Reactions  . Macrodantin [Nitrofurantoin] Other (See Comments)    "blocked kidneys"   . Sulfa Antibiotics Rash  . Lisinopril Cough  . Penicillins     08/30/19- Patient can't remember, was 35+ years ago. OK with trying cephalexin in hospital    Diagnostic Studies/Procedures    TAVR OPERATIVE NOTE   Date of Procedure:08/15/2019  Preoperative Diagnosis:Severe Aortic Stenosis   Postoperative Diagnosis:Same   Procedure:   Transcatheter Aortic Valve Replacement - PercutaneousRightTransfemoral Approach Edwards Sapien 3 Ultra THV (size 53mm, model # [id number], serial # [id number])  Co-Surgeons:Bryan Alveria Apley, MD and Sherren Mocha, MD   Anesthesiologist:Carswell Glennon Mac, MD  Echocardiographer:M. Croitoru, MD  Pre-operative Echo Findings: ? Severe aortic stenosis ? Normalleft ventricular systolic function  Post-operative Echo Findings: ? NOparavalvular leak ? Normalleft ventricular systolic  function   _________________    Echo 08/16/19:  IMPRESSIONS  1. Left ventricular ejection fraction, by estimation, is 55 to 60%. The  left ventricle has normal function. The left ventricle demonstrates  regional wall motion abnormalities (see scoring diagram/findings for  description). There is mild concentric left ventricular hypertrophy. Indeterminate diastolic filling due to E-A fusion.  2. Right ventricular systolic function is normal. The right ventricular  size is normal.  3. The mitral valve is normal in structure. No evidence of mitral valve  regurgitation. No evidence of mitral stenosis.  4. The aortic valve is normal in structure. Aortic valve regurgitation is  not visualized. No aortic stenosis is present. Aortic valve mean gradient  measures 11.7 mmHg. Aortic valve Vmax measures 2.48 m/s.  5. The inferior vena cava is normal in size with greater than 50%  respiratory variability, suggesting right atrial pressure of 3 mmHg.   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 No significant chronic ischemic change.  _______________  Ct angio head/neck 08/16/19 IMPRESSION: 1. Progressive cervical carotid atherosclerosis with 75% right and 65-70% left proximal ICA stenoses. 2. 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 3. New moderate right and mild left M1 stenoses. 4. Unchanged mild left intracranial ICA stenosis. 5. Aortic Atherosclerosis (ICD10-I70.0).   History of Present Illness     Alexis Griffith is a 79 y.o. male with a history of HTN, HLD, DMT2, OSA, history of CVA, multivessel CAD, recently diagnosed renal cell carcinoma and severe AS who presented to [facility] on 08/15/19 for planned TAVR.   He had a 2D echocardiogram on 04/25/2018 which showed a mean gradient across aortic valve of 29 mmHg with a peak gradient of 54 mmHg. Valve area was  0.79cm. Over the past few  months he has been complaining of occasional vague chest discomfort that is not necessarily exertional related. He underwent a gated coronary CT which showed a calcium score of 2633. There was severe multivessel calcified plaque. The FFR was 0.90 in the proximal LAD, 0.78 in the mid LAD, and 0.68 in the distal LAD. The FFR was 0.87 in the proximal left circumflex, 0.71 in the mid left circumflex, and 0.57 in the distal left circumflex. The right coronary was occluded proximally. He subsequently underwent cardiac catheterization on 07/07/2019. This showed 70% stenosis in the proximal to mid LAD. The distal LAD had 75% stenosis. The left circumflex had 75% proximal and mid stenosis. The right coronary was occluded at its origin with filling of the dominant distal vessel by collaterals from the left. Left ventricular ejection fraction was 55 to 65%. The mean gradient measured across the aortic valve was 37 mmHg. He underwent a repeat echocardiogram 07/17/19 which showed a mean gradient across aortic valve of 43 mmHg with a peak gradient of 62 mmHg. Aortic valve area was 0.68 cm. There was mild aortic insufficiency. Left ventricular ejection fraction was 55 to 60%. He subsequently underwent CT scan work-up for consideration of TAVR which incidentally showed a 7.2 x 6.1 x 8.3 cm heterogeneous enhancing left renal mass suspicious for primary renal cell carcinoma. There are also several small pulmonary nodules throughout the lungs bilaterally with the largest in the right lower lobe measuring 1.3 x 0.9 cm. Follow up PET scan showed a hypermetabolic exophytic 7 cm renal cortical mass with a maximum SUV of 8.1 that is most likely a renal cell carcinoma as well as anal hypermetabolism which is most likely due to muscle activity. The 1.2 cm dominant right lower lobe pulmonary nodule was not hypermetabolic. There were a few additional small solid pulmonary nodules scattered in both  lungs measuring up to 0.5 cm in the left upper lobe are all below PET resolution. These will require follow up in 3 months.   The patient has been evaluated by the multidisciplinary valve team and felt to have severe, symptomatic aortic stenosis and to be a suitable candidate for TAVR, which was set up for 08/15/19. We plan to treat his coronary disease medically and urology consultation has been set up later this month to discuss laparoscopic RCC resection.     Hospital Course     Consultants: urology, neurology, electrophysiology  Severe AS:s/p successful TAVR with a 26 mm Edwards Sapien 3 THV via the TF approach on 08/15/19. Post operative echo showed EF 55%, normally functioning TAVR with a mean gradient of 11.7 mm hg and no PVL. No evidence of HAVB. Plavix now discontinued with gross hematuria. Prolonged admission due to issues with acute urinary retention, hematuria and orthostatic hypotension with recurrent syncope. Covid test negative. Plan for DC today [facility] in [place].  Acute on chronic diastolic ZOX:WRUEAVW euvolemic after TAVR, excellent UO even though diuretics were stopped due to orthostatic hypotension.  AKI: creat peaked at 1.74. Nowresolved.   Cerebrovascular disease:asymptomatic at this time.MR brain showed a4 mm acute infarct left medial frontalcortex, which was felt to be incident[place] procedural related.CTA of the head and neck show moderate 65 to 75% bilateral ICA stenoses but will require continued surveillance and outpatient follow-up.  Orthostatic hypotension with recurrent syncope: all BP meds were held (except very low dose metoprolol which was added back due to rebound tachycardia). Compression stockings and abdominal binder placed.  Doxazosin switched to tamsulosin, then tamsulosin discontinued. Started on midodrine  5mg  TID and then Florinef 0.1mg  daily. Appreciate Dr. Olin Pia assistance. He questioned this being related to potential CVA. He made the  following recommendations.  #1-out of bed is much as possible #2-bed in reverse Trendelenburg 15-20 degrees #3-discontinue fludrocortisone #4-begin Mestinon 30 mg twice daily #5-dose ProAmatine every 4 hours upon arising in the morning.  Not to be taken while he is supine #6-abdominal binder as he has but necessary only when he is not supine #7-when he goes home his thigh-high support stockings will probably be unmanageable.  Thigh sleeves can be obtained on the Internet and are much more effective than calf stockings.  HTN:we have been allowing for permissive supine hypertension given significant orthostasis. Continue only Lopressor 12.5mg  BID. We can add back as orthostasis improves.  DMT2: treated with SSI. Will resume home regimen at DC.  Acute urinary retention with hematuria:had coude cath inserted then removed. Now coude cath replaced given recurrent acute urinary retention. Had gross hematuria that has resolved since discontinuing plavix.Doxazosin switched to tamsulosin given significant orthostasis. Given continue orthostasis and cath placement, tamsulosin also discontinued. Urology has recommended keeping in [place] cath at discharge and consider a voiding trial with Dr. Tresa Moore when he sees him on 7/12 @ 1pm  UTI:pt developed abdominal pain with mild leukocytosis and fever. CT abdomen unremarkable. UA showed UTI. No culture obtained. He was started on Cipro and white count and fever resolved.   Epididymoorchitis : pt developed an erythematous, swollen, painful right testicle. Per urology, this is felt to be epididymoorchitis secondary to Foley catheter. Confirmed by scrotal US. Based upon allergies we started him on Keflex 500 mg 3 times a day for 10 days and Mobic 15 mg a day to be continued x 2 weeks. Cipro was discontinued. Pt has had significant nausea with Keflex. I spoke to the urologist on call today ( Dr. Claudia Desanctis) and she recommended starting Fosfomycin q 3 days for 3 doses.  Please follow up with urology for any urologic problems.   RCC: pre TAVR CT showed a large RCC and pulmonary nodules. Follow up PET scan did not show evidence of metastatic disease. He has an apt with Dr. Tresa Moore with urologyon 6/29 which I have moved out to 7/12 @1  pm.   Pulmonary nodules: PET scan showed a dominant right lower lobe 1.2 cm solid pulmonary nodule is non-hypermetabolic. Additional small solid subcentimeter pulmonary nodules in both lungs are below PET resolution and warrant attention on follow-up chest CT in 3 months. This will be followed over time.  Constipation: he can take Miralax.  _____________  Discharge Vitals Blood pressure (!) 140/59, pulse 80, temperature 97.7 F (36.5 C), temperature source Oral, resp. rate 19, height 5\' 10"  (1.778 m), weight 86.5 kg, SpO2 97 %.  Filed Weights   08/28/19 0456 08/29/19 0447 08/30/19 0903  Weight: 85.8 kg 79.1 kg 86.5 kg     PHYSICAL EXAM:      GEN:No acute distress.   Neck:No JVD Cardiac:RRR, soft flow murmur. No rubs, or gallops.  Respiratory:Clear to auscultation bilaterally. JH:ERDE, nontender, non-distended + bowel sounds MS:No edema; No deformity. Neuro:Nonfocal  Skin: right testicle with swelling, redness, erythema, rubor and pain.  Psych: Normal affect    Labs & Radiologic Studies    CBC Recent Labs    08/28/19 1156  WBC 7.9  HGB 12.7*  HCT 38.2*  MCV 81.6  PLT 081   Basic Metabolic Panel Recent Labs    08/28/19 1156  NA 132*  K 3.6  CL  99  CO2 23  GLUCOSE 224*  BUN 9  CREATININE 0.87  CALCIUM 9.0   Liver Function Tests No results for input(s): AST, ALT, ALKPHOS, BILITOT, PROT, ALBUMIN in the last 72 hours. No results for input(s): LIPASE, AMYLASE in the last 72 hours. Cardiac Enzymes No results for input(s): CKTOTAL, CKMB, CKMBINDEX, TROPONINI in the last 72 hours. BNP Invalid input(s): POCBNP D-Dimer No results for input(s): DDIMER in the last 72 hours. Hemoglobin A1C No  results for input(s): HGBA1C in the last 72 hours. Fasting Lipid Panel No results for input(s): CHOL, HDL, LDLCALC, TRIG, CHOLHDL, LDLDIRECT in the last 72 hours. Thyroid Function Tests No results for input(s): TSH, T4TOTAL, T3FREE, THYROIDAB in the last 72 hours.  Invalid input(s): FREET3 _____________  CT ANGIO HEAD W OR WO CONTRAST  Result Date: 08/16/2019 CLINICAL DATA:  Stroke follow-up. Subcentimeter left frontal infarct on MRI. EXAM: CT ANGIOGRAPHY HEAD AND NECK TECHNIQUE: Multidetector CT imaging of the head and neck was performed using the standard protocol during bolus administration of intravenous contrast. Multiplanar CT image reconstructions and MIPs were obtained to evaluate the vascular anatomy. Carotid stenosis measurements (when applicable) are obtained utilizing NASCET criteria, using the distal internal carotid diameter as the denominator. CONTRAST:  27mL OMNIPAQUE IOHEXOL 350 MG/ML SOLN COMPARISON:  Head MRI 08/16/2019.  Head and neck CTA 12/31/2011. FINDINGS: CT HEAD FINDINGS Brain: The punctate medial left frontal cortical infarct on MRI is not well demonstrated by CT. No acute large territory infarct, intracranial hemorrhage, mass, midline shift, or extra-axial fluid collection is identified. The ventricles and sulci are within normal limits for age. Vascular: Calcified atherosclerosis at the skull base. Skull: No fracture or suspicious osseous lesion. Sinuses: Minimal mucosal thickening in the paranasal sinuses. Clear mastoid air cells. Orbits: Bilateral cataract extraction. Review of the MIP images confirms the above findings CTA NECK FINDINGS Aortic arch: Standard 3 vessel aortic arch with mild atherosclerotic plaque. No significant arch vessel origin stenosis. Right carotid system: Patent with prominent, largely calcified plaque at the carotid bifurcation resulting in progressive high-grade stenosis of the distal common and proximal internal and external carotid arteries. Luminal  visualization is limited by the calcified plaque, however ICA origin stenosis is estimated at 75%. Left carotid system: Patent with predominantly calcified plaque at the carotid bifurcation resulting in progressive ICA origin stenosis estimated at 65-70%. Vertebral arteries: Patent and strongly dominant left vertebral artery with mild origin stenosis due to calcified plaque, unchanged. Diminutive right vertebral artery with chronic occlusion beginning at the V3 level. Skeleton: No acute osseous abnormality or suspicious osseous lesion. Other neck: No evidence of cervical lymphadenopathy or mass. Upper chest: Small pulmonary nodules including a 9 x 4 mm nodule in the left upper lobe, more fully evaluated on chest CT and PET-CT last month. Mild dependent atelectasis in both lungs. Review of the MIP images confirms the above findings CTA HEAD FINDINGS Anterior circulation: The internal carotid arteries are patent from skull base to carotid termini with asymmetric atherosclerotic plaque on the left resulting in mild left cavernous and proximal supraclinoid stenoses, similar to the prior CTA. ACAs and MCAs are patent without evidence of a proximal branch occlusion. There are new moderate right and mild left M1 stenoses. The right A1 segment is dominant. An infundibulum is noted at the right posterior communicating origin. Posterior circulation: The intracranial left vertebral artery is widely patent and supplies the basilar. 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 The basilar artery is widely patent. Patent SCAs are seen  bilaterally. Both PCAs are patent with mild irregularity but no significant proximal stenosis. No aneurysm is identified. Venous sinuses: Patent. Anatomic variants: None of significance. Review of the MIP images confirms the above findings IMPRESSION: 1. Progressive cervical carotid atherosclerosis with 75% right and 65-70% left proximal ICA stenoses. 2. Chronic distal occlusion  of the non dominant right vertebral artery. 3. New moderate right and mild left M1 stenoses. 4. Unchanged mild left intracranial ICA stenosis. 5. Aortic Atherosclerosis (ICD10-I70.0). Electronically Signed   By: Logan Bores M.D.   On: 08/16/2019 20:54   DG Chest 2 View  Result Date: 08/11/2019 CLINICAL DATA:  Pre-admission for TAVR. History of diabetes and sleep apnea. EXAM: CHEST - 2 VIEW COMPARISON:  Radiographs 11/11/2010 and CT 07/14/2019. PET-CT 07/27/2019. FINDINGS: The heart size and mediastinal contours are stable. Tiny left upper lobe nodule may be calcified and appears stable. Other nodules seen on PET-CT are not clearly visualized. The lungs are otherwise clear. No pleural effusion or pneumothorax. No acute osseous findings. IMPRESSION: No active cardiopulmonary process. Electronically Signed   By: Richardean Sale M.D.   On: 08/11/2019 09:47   CT ANGIO NECK W OR WO CONTRAST  Result Date: 08/16/2019 CLINICAL DATA:  Stroke follow-up. Subcentimeter left frontal infarct on MRI. EXAM: CT ANGIOGRAPHY HEAD AND NECK TECHNIQUE: Multidetector CT imaging of the head and neck was performed using the standard protocol during bolus administration of intravenous contrast. Multiplanar CT image reconstructions and MIPs were obtained to evaluate the vascular anatomy. Carotid stenosis measurements (when applicable) are obtained utilizing NASCET criteria, using the distal internal carotid diameter as the denominator. CONTRAST:  48mL OMNIPAQUE IOHEXOL 350 MG/ML SOLN COMPARISON:  Head MRI 08/16/2019.  Head and neck CTA 12/31/2011. FINDINGS: CT HEAD FINDINGS Brain: The punctate medial left frontal cortical infarct on MRI is not well demonstrated by CT. No acute large territory infarct, intracranial hemorrhage, mass, midline shift, or extra-axial fluid collection is identified. The ventricles and sulci are within normal limits for age. Vascular: Calcified atherosclerosis at the skull base. Skull: No fracture or  suspicious osseous lesion. Sinuses: Minimal mucosal thickening in the paranasal sinuses. Clear mastoid air cells. Orbits: Bilateral cataract extraction. Review of the MIP images confirms the above findings CTA NECK FINDINGS Aortic arch: Standard 3 vessel aortic arch with mild atherosclerotic plaque. No significant arch vessel origin stenosis. Right carotid system: Patent with prominent, largely calcified plaque at the carotid bifurcation resulting in progressive high-grade stenosis of the distal common and proximal internal and external carotid arteries. Luminal visualization is limited by the calcified plaque, however ICA origin stenosis is estimated at 75%. Left carotid system: Patent with predominantly calcified plaque at the carotid bifurcation resulting in progressive ICA origin stenosis estimated at 65-70%. Vertebral arteries: Patent and strongly dominant left vertebral artery with mild origin stenosis due to calcified plaque, unchanged. Diminutive right vertebral artery with chronic occlusion beginning at the V3 level. Skeleton: No acute osseous abnormality or suspicious osseous lesion. Other neck: No evidence of cervical lymphadenopathy or mass. Upper chest: Small pulmonary nodules including a 9 x 4 mm nodule in the left upper lobe, more fully evaluated on chest CT and PET-CT last month. Mild dependent atelectasis in both lungs. Review of the MIP images confirms the above findings CTA HEAD FINDINGS Anterior circulation: The internal carotid arteries are patent from skull base to carotid termini with asymmetric atherosclerotic plaque on the left resulting in mild left cavernous and proximal supraclinoid stenoses, similar to the prior CTA. ACAs and MCAs are patent  without evidence of a proximal branch occlusion. There are new moderate right and mild left M1 stenoses. The right A1 segment is dominant. An infundibulum is noted at the right posterior communicating origin. Posterior circulation: The intracranial  left vertebral artery is widely patent and supplies the basilar. 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 The basilar artery is widely patent. Patent SCAs are seen bilaterally. Both PCAs are patent with mild irregularity but no significant proximal stenosis. No aneurysm is identified. Venous sinuses: Patent. Anatomic variants: None of significance. Review of the MIP images confirms the above findings IMPRESSION: 1. Progressive cervical carotid atherosclerosis with 75% right and 65-70% left proximal ICA stenoses. 2. 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 3. New moderate right and mild left M1 stenoses. 4. Unchanged mild left intracranial ICA stenosis. 5. Aortic Atherosclerosis (ICD10-I70.0). Electronically Signed   By: Logan Bores M.D.   On: 08/16/2019 20:54   MR BRAIN WO CONTRAST  Result Date: 08/16/2019 CLINICAL DATA:  Ataxia.  Rule out stroke.  TAVR 08/15/2019 EXAM: MRI HEAD WITHOUT CONTRAST TECHNIQUE: Multiplanar, multiecho pulse sequences of the brain and surrounding structures were obtained without intravenous contrast. COMPARISON:  MRI head 01/18/2012 FINDINGS: Brain: Small focus of acute infarction in the left medial frontal lobe. This measures approximately 4 mm in diameter. No other acute infarct. Moderate atrophy. Negative for hydrocephalus. Normal white matter. Negative for hemorrhage or mass. Vascular: Normal arterial flow voids. Distal right vertebral artery appears hypoplastic unchanged from prior studies. Skull and upper cervical spine: No focal skeletal lesion. Sinuses/Orbits: Moderate mucosal edema paranasal cataract extraction sinuses. Bilateral Other: None IMPRESSION: 4 mm acute infarct left medial frontal cortex Generalized atrophy.  No significant chronic ischemic change. Electronically Signed   By: Franchot Gallo M.D.   On: 08/16/2019 14:57   CT ABDOMEN PELVIS W CONTRAST  Result Date: 08/24/2019 CLINICAL DATA:  79 year old male with  abdominal pain, nausea vomiting. EXAM: CT ABDOMEN AND PELVIS WITH CONTRAST TECHNIQUE: Multidetector CT imaging of the abdomen and pelvis was performed using the standard protocol following bolus administration of intravenous contrast. CONTRAST:  172mL OMNIPAQUE IOHEXOL 300 MG/ML  SOLN COMPARISON:  None. FINDINGS: Lower chest: The visualized lung bases are clear. There is coronary vascular calcification. No intra-abdominal free air or free fluid. Hepatobiliary: The liver is unremarkable. No intrahepatic biliary duct dilatation. There is a small stone within gallbladder. No pericholecystic fluid or evidence of acute cholecystitis by CT. Pancreas: Unremarkable. No pancreatic ductal dilatation or surrounding inflammatory changes. Spleen: Normal in size without focal abnormality. Adrenals/Urinary Tract: The adrenal glands are unremarkable. There is a 5.3 x 8.7 x 7.0 cm heterogeneously enhancing, partially exophytic mass from the lateral interpolar left kidney with areas central necrosis most consistent with malignancy. Further characterization with renal mass protocol MRI is recommended. Several small cysts measure up to 5 cm in the upper pole of the left kidney. Subcentimeter right renal hypodense focus is too small to characterize. Several small nonobstructing bilateral renal calculi measure up to 3 mm in the upper pole of the left kidney. There is no hydronephrosis on either side. There is symmetric enhancement and excretion of contrast by both kidneys. The visualized ureters and urinary bladder appear unremarkable. Stomach/Bowel: There is sigmoid diverticulosis and scattered colonic diverticula without active inflammatory changes. There is no bowel obstruction or active inflammation. Focal stranding adjacent to the left paracolic gutter likely reactive to left renal mass. The appendix is normal. Vascular/Lymphatic: Advanced aortoiliac atherosclerotic disease. The IVC is unremarkable.  No portal venous gas. There is no  adenopathy. Reproductive: The prostate gland is enlarged measuring 7 cm in transverse axial diameter. Other: Right inguinal subcutaneous stranding, possibly related to recent procedure. Clinical correlation is recommended. No large fluid collection or hematoma. Musculoskeletal: No acute osseous pathology. No suspicious osseous lesion. Bilateral L5 pars defects with grade 1 L5-S1 anterolisthesis. IMPRESSION: 1. Left renal interpolar mass most consistent with malignancy. Further characterization with renal mass protocol MRI is recommended. 2. Colonic diverticulosis. No bowel obstruction. Normal appendix. 3. Cholelithiasis. 4. Enlarged prostate gland. 5. Bilateral L5 pars defects with grade 1 L5-S1 anterolisthesis. 6. Aortic Atherosclerosis (ICD10-I70.0). Electronically Signed   By: Anner Crete M.D.   On: 08/24/2019 19:50   US SCROTUM W/DOPPLER  Result Date: 08/30/2019 CLINICAL DATA:  Right testicular pain, swelling EXAM: SCROTAL ULTRASOUND DOPPLER ULTRASOUND OF THE TESTICLES TECHNIQUE: Complete ultrasound examination of the testicles, epididymis, and other scrotal structures was performed. Color and spectral Doppler ultrasound were also utilized to evaluate blood flow to the testicles. COMPARISON:  None. FINDINGS: Right testicle Measurements: 3.9 x 3.4 x 3.1 cm. No mass or microlithiasis visualized. Left testicle Measurements: 4.7 x 2.3 x 2.1 cm. No mass or microlithiasis visualized. Right epididymis: Enlarged, heterogeneous. Complex area in the epididymal head measuring up to 1.2 cm. Left epididymis:  Normal in size and appearance. Hydrocele:  None visualized. Varicocele:  None visualized. Pulsed Doppler interrogation of both testes demonstrates normal low resistance arterial and venous waveforms bilaterally. IMPRESSION: Heterogeneous, enlarged right epididymis may reflect epididymitis. There is a complex hypoechoic area centrally within the epididymis which could reflect complex cyst/spermatocele or  abscess. This could be followed after a course of antibiotic treatment. No evidence of testicular mass or torsion. Electronically Signed   By: Rolm Baptise M.D.   On: 08/30/2019 16:36   VAS US CAROTID  Result Date: 08/18/2019 Carotid Arterial Duplex Study Indications:       CVA and CTA showed carotid stenosis progression, but will                    need to compare with carotid doppler. Risk Factors:      Hypertension, hyperlipidemia, Diabetes, prior CVA. Comparison Study:  06/01/19 previous Performing Technologist: Abram Sander RVS  Examination Guidelines: A complete evaluation includes B-mode imaging, spectral Doppler, color Doppler, and power Doppler as needed of all accessible portions of each vessel. Bilateral testing is considered an integral part of a complete examination. Limited examinations for reoccurring indications may be performed as noted.  Right Carotid Findings: +----------+--------+--------+--------+------------------+--------+           PSV cm/sEDV cm/sStenosisPlaque DescriptionComments +----------+--------+--------+--------+------------------+--------+ CCA Prox  78      7               heterogenous               +----------+--------+--------+--------+------------------+--------+ CCA Distal53      7               heterogenous               +----------+--------+--------+--------+------------------+--------+ ICA Prox  305     45      40-59%  heterogenous               +----------+--------+--------+--------+------------------+--------+ ICA Mid   132     24                                         +----------+--------+--------+--------+------------------+--------+  ICA Distal77      17                                         +----------+--------+--------+--------+------------------+--------+ ECA       541                                                +----------+--------+--------+--------+------------------+--------+  +----------+--------+-------+--------+-------------------+           PSV cm/sEDV cmsDescribeArm Pressure (mmHG) +----------+--------+-------+--------+-------------------+ Subclavian109                                        +----------+--------+-------+--------+-------------------+ +---------+--------+--+--------+---------+ VertebralPSV cm/s35EDV cm/sAntegrade +---------+--------+--+--------+---------+  Left Carotid Findings: +----------+--------+--------+--------+------------------+--------+           PSV cm/sEDV cm/sStenosisPlaque DescriptionComments +----------+--------+--------+--------+------------------+--------+ CCA Prox  123                     heterogenous               +----------+--------+--------+--------+------------------+--------+ CCA Distal130                     heterogenous               +----------+--------+--------+--------+------------------+--------+ ICA Prox  137     35      1-39%   heterogenous               +----------+--------+--------+--------+------------------+--------+ ICA Distal97      17                                         +----------+--------+--------+--------+------------------+--------+ ECA       167                                                +----------+--------+--------+--------+------------------+--------+ +----------+--------+--------+--------+-------------------+           PSV cm/sEDV cm/sDescribeArm Pressure (mmHG) +----------+--------+--------+--------+-------------------+ Subclavian115                                         +----------+--------+--------+--------+-------------------+ +---------+--------+--+--------+--+---------+ VertebralPSV cm/s44EDV cm/s12Antegrade +---------+--------+--+--------+--+---------+   Summary: Right Carotid: 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 Left Carotid: Velocities in the left ICA are consistent with a 1-39%  stenosis. Vertebrals: Bilateral vertebral arteries demonstrate antegrade flow. *See table(s) above for measurements and observations.  Electronically signed by Antony Contras MD on 08/18/2019 at 11:00:32 AM.    Final    ECHOCARDIOGRAM LIMITED  Result Date: 08/16/2019    ECHOCARDIOGRAM REPORT   Patient Name:   Alexis Griffith Date of Exam: 08/16/2019 Medical Rec #:  [id number]      Height:       70.0 in Accession #:    [id number]  Weight:       195.1 lb Date of Birth:  31-Jan-1941      BSA:          2.065 m Patient Age:    19 years       BP:           146/60 mmHg Patient Gender: M              HR:           91 bpm. Exam Location:  Inpatient Procedure: Limited Echo, Cardiac Doppler and Color Doppler Indications:    Post TAVR evaluation  History:        Patient has prior history of Echocardiogram examinations, most                 recent 08/15/2019. CHF, CAD, Arrythmias:PVC; Risk                 Factors:Hypertension, Diabetes, Sleep Apnea and Former Smoker.                 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  Sonographer:    Clayton Lefort RDCS (AE) Referring Phys: [id number] [place]  1. Left ventricular ejection fraction, by estimation, is 55 to 60%. The left ventricle has normal function. The left ventricle demonstrates regional wall motion abnormalities (see scoring diagram/findings for description). There is mild concentric left ventricular hypertrophy. Indeterminate diastolic filling due to E-A fusion.  2. Right ventricular systolic function is normal. The right ventricular size is normal.  3. The mitral valve is normal in structure. No evidence of mitral valve regurgitation. No evidence of mitral stenosis.  4. The aortic valve is normal in structure. Aortic valve regurgitation is not visualized. No aortic stenosis is present. Aortic valve mean gradient measures 11.7 mmHg. Aortic valve Vmax measures 2.48 m/s.  5. The inferior vena cava is normal in size with greater than 50% respiratory variability,  suggesting right atrial pressure of 3 mmHg. FINDINGS  Left Ventricle: Left ventricular ejection fraction, by estimation, is 55 to 60%. The left ventricle has normal function. The left ventricle demonstrates regional wall motion abnormalities. The left ventricular internal cavity size was normal in size. There is mild concentric left ventricular hypertrophy. Indeterminate diastolic filling due to E-A fusion.  LV Wall Scoring: The basal inferolateral segment and basal inferior segment are hypokinetic. Right Ventricle: The right ventricular size is normal. No increase in right ventricular wall thickness. Right ventricular systolic function is normal. Left Atrium: Left atrial size was normal in size. Right Atrium: Right atrial size was normal in size. Pericardium: There is no evidence of pericardial effusion. Mitral Valve: The mitral valve is normal in structure. Normal mobility of the mitral valve leaflets. No evidence of mitral valve regurgitation. No evidence of mitral valve stenosis. Tricuspid Valve: The tricuspid valve is normal in structure. Tricuspid valve regurgitation is not demonstrated. No evidence of tricuspid stenosis. Aortic Valve: The aortic valve is normal in structure. Aortic valve regurgitation is not visualized. No aortic stenosis is present. Aortic valve mean gradient measures 11.7 mmHg. Aortic valve peak gradient measures 24.6 mmHg. Aortic valve area, by VTI measures 1.72 cm. Pulmonic Valve: The pulmonic valve was normal in structure. Pulmonic valve regurgitation is not visualized. No evidence of pulmonic stenosis. Aorta: The aortic root is normal in size and structure. Venous: The inferior vena cava is normal in size with greater than 50% respiratory variability, suggesting right atrial pressure of 3 mmHg. IAS/Shunts: No atrial level shunt  detected by color flow Doppler.  LEFT VENTRICLE PLAX 2D LVIDd:         4.80 cm LVIDs:         3.60 cm LV PW:         1.00 cm LV IVS:        1.50 cm LVOT diam:      2.60 cm LV SV:         71 LV SV Index:   34 LVOT Area:     5.31 cm  IVC IVC diam: 1.70 cm LEFT ATRIUM         Index LA diam:    4.10 cm 1.99 cm/m  AORTIC VALVE AV Area (Vmax):    1.85 cm AV Area (Vmean):   1.91 cm AV Area (VTI):     1.72 cm AV Vmax:           248.00 cm/s AV Vmean:          157.000 cm/s AV VTI:            0.413 m AV Peak Grad:      24.6 mmHg AV Mean Grad:      11.7 mmHg LVOT Vmax:         86.20 cm/s LVOT Vmean:        56.400 cm/s LVOT VTI:          0.134 m LVOT/AV VTI ratio: 0.32  AORTA Ao Root diam: 2.90 cm  SHUNTS Systemic VTI:  0.13 m Systemic Diam: 2.60 cm Dani Gobble Croitoru MD Electronically signed by Sanda Klein MD Signature Date/Time: 08/16/2019/12:53:54 PM    Final    ECHOCARDIOGRAM LIMITED  Result Date: 08/15/2019    ECHOCARDIOGRAM LIMITED REPORT   Patient Name:   Alexis Griffith Date of Exam: 08/15/2019 Medical Rec #:  [id number]      Height:       70.0 in Accession #:    [id number]     Weight:       200.0 lb Date of Birth:  15-Jan-1941      BSA:          2.087 m Patient Age:    64 years       BP:           170/43 mmHg Patient Gender: M              HR:           63 bpm. Exam Location:  Inpatient Procedure: Limited Echo, Cardiac Doppler and Color Doppler Indications:     Aortic stenosis  History:         Patient has prior history of Echocardiogram examinations, most                  recent 07/18/2019. CAD, Carotid Disease and TIA, Aortic Valve                  Disease; Risk Factors:Hypertension, Dyslipidemia and Diabetes.                  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 Procedure Date:                  08/15/2019.  Sonographer:     Dustin Flock Referring Phys:  [id number] Woodfin Ganja THOMPSON Diagnosing Phys: Sanda Klein MD  PREOPERATIVE FINDINGS:                   Normal left ventricular systolic function, Estimated LV                  ejection fraction 55-60%. Basal  inferolateral mild hypokinesis.                  Trileaflet aortic valve with severe calcific stenosis. Mild                  aortic regurgitation.                  Peak aortic valve gradient 56 mm Hg, mean gradient 33 mm Hg,                  dimensionless obstructive index 0.18, estimated aortic valve                  area 0.6 cm sq.                  No mitral insufficiency.                  No pericardial effusion.                   POST-PROCEDURE FINDINGS                   Normal left ventricular systolic function, Estimated LV                  ejection fraction 55-60%. Basal inferolateral mild hypokinesis.                  Well seated aortic stent-valve TAVR. No aortic regurgitation.                  No perivalvular leak.                  Peak aortic valve gradient 12 mm Hg, mean gradient 6 mm Hg,                  dimensionless obstructive index 0.67, estimated aortic valve                  area 2.14 cm sq.                  Mild mitral insufficiency.                  No pericardial effusion. IMPRESSIONS  1. Left ventricular ejection fraction, by estimation, is 55 to 60%. The left ventricle has normal function. The left ventricle demonstrates regional wall motion abnormalities (see scoring diagram/findings for description). There is mild concentric left ventricular hypertrophy. Left ventricular diastolic function could not be evaluated.  2. Right ventricular systolic function is normal. Tricuspid regurgitation signal is inadequate for assessing PA pressure.  3. Left atrial size was mildly dilated.  4. The mitral valve is normal in structure. Mild mitral valve regurgitation.  5. The aortic valve has been repaired/replaced. Aortic valve regurgitation is not visualized. Severe aortic valve stenosis. 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 Procedure Date: 08/15/2019. Echo findings  are consistent with normal structure and function of the aortic valve prosthesis.  FINDINGS  Left Ventricle: Left ventricular ejection fraction, by estimation, is 55 to 60%. The left ventricle has normal function. The left ventricle demonstrates regional  wall motion abnormalities. The left ventricular internal cavity size was normal in size. There is mild concentric left ventricular hypertrophy.  LV Wall Scoring: The basal inferolateral segment and basal inferior segment are hypokinetic. Right Ventricle: No increase in right ventricular wall thickness. Right ventricular systolic function is normal. Tricuspid regurgitation signal is inadequate for assessing PA pressure. Left Atrium: Left atrial size was mildly dilated. Right Atrium: Right atrial size was normal in size. Pericardium: There is no evidence of pericardial effusion. Mitral Valve: The mitral valve is normal in structure. There is mild thickening of the mitral valve leaflet(s). Mild mitral valve regurgitation, with posteriorly-directed jet. Tricuspid Valve: The tricuspid valve is normal in structure. Aortic Valve: The aortic valve has been repaired/replaced.. There is severe thickening and severe calcifcation of the aortic valve. Aortic valve regurgitation is not visualized. Severe aortic stenosis is present. There is severe thickening of the aortic valve. There is severe calcifcation of the aortic valve. Aortic valve mean gradient measures 6.0 mmHg. Aortic valve peak gradient measures 12.4 mmHg. Aortic valve area, by VTI measures 2.23 cm. 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 Procedure Date: 08/15/2019. Echo findings are consistent with normal structure and function of the aortic valve prosthesis. Pulmonic Valve: The pulmonic valve was not assessed. Aorta: The aortic root and ascending aorta are structurally normal, with no evidence of dilitation. IAS/Shunts: The interatrial septum was not assessed.  LEFT VENTRICLE PLAX 2D LVOT diam:     2.07 cm LV SV:         90 LV SV Index:    43 LVOT Area:     3.36 cm  AORTIC VALVE AV Area (Vmax):    2.08 cm AV Area (Vmean):   2.08 cm AV Area (VTI):     2.23 cm AV Vmax:           176.00 cm/s AV Vmean:          116.000 cm/s AV VTI:            0.404 m AV Peak Grad:      12.4 mmHg AV Mean Grad:      6.0 mmHg LVOT Vmax:         109.00 cm/s LVOT Vmean:        71.800 cm/s LVOT VTI:          0.269 m LVOT/AV VTI ratio: 0.67  SHUNTS Systemic VTI:  0.27 m Systemic Diam: 2.07 cm Sanda Klein MD Electronically signed by Sanda Klein MD Signature Date/Time: 08/15/2019/10:06:02 AM    Final    Structural Heart Procedure  Result Date: 08/15/2019 See surgical note for result.  Disposition   Pt is being discharged to a SNF today in good condition.  Follow-up Plans & Appointments     Follow-up Information    Alexis Frock, MD. Go on 09/11/2019.   Specialties: Anesthesiology, Cardiology Why: @ 1pm Contact information: [place] [id number] [phone]        Eileen Stanford, PA-C. Go on 09/27/2019.   Specialties: Cardiology, Radiology Why: @ 2:45 pm for an ultrasound of your heart followed by an apt with Don[facility] Matters information: [place] [phone]                Discharge Medications   Allergies as of 08/31/2019      Reactions   Macrodantin [nitrofurantoin] Other (See Comments)   "  blocked kidneys"    Sulfa Antibiotics Rash   Lisinopril Cough   Penicillins    08/30/19- Patient can't remember, was 35+ years ago. OK with trying cephalexin in hospital      Medication List    STOP taking these medications   dipyridamole-aspirin 200-25 MG 12hr capsule Commonly known as: AGGRENOX   doxazosin 4 MG tablet Commonly known as: CARDURA   isosorbide mononitrate 30 MG 24 hr tablet Commonly known as: IMDUR   losartan-hydrochlorothiazide 100-25 MG tablet Commonly known as: Hyzaar     TAKE these medications   acetaminophen 500 MG  tablet Commonly known as: TYLENOL Take 1,000 mg by mouth every 6 (six) hours as needed for moderate pain.   aspirin 81 MG chewable tablet Chew 1 tablet (81 mg total) by mouth daily. Start taking on: September 01, 2019   carboxymethylcellulose 0.5 % Soln Commonly known as: REFRESH PLUS Place 1 drop into both eyes 2 (two) times daily as needed (dry eyes).   clonazePAM 0.5 MG tablet Commonly known as: KLONOPIN Take 0.5 mg by mouth 4 (four) times daily.   Coenzyme Q10 200 MG capsule Take 200 mg by mouth daily.   fosfomycin 3 g Pack Commonly known as: MONUROL Take 3 g by mouth every 3 (three) days for 10 days. Mix with 8 oz water and administer immediately.   Lantus SoloStar 100 UNIT/ML Solostar Pen Generic drug: insulin glargine Inject 65 Units into the skin daily.   meloxicam 15 MG tablet Commonly known as: MOBIC Take 1 tablet (15 mg total) by mouth daily.   metoprolol tartrate 25 MG tablet Commonly known as: LOPRESSOR Take 0.5 tablets (12.5 mg total) by mouth 2 (two) times daily. What changed:   medication strength  how much to take  when to take this   midodrine 5 MG tablet Commonly known as: PROAMATINE Take 1 tablet (5 mg total) by mouth in the morning, at noon, in the evening, and at bedtime.   glucose blood test strip 1 each by Other route as needed.   ONE TOUCH ULTRA TEST test strip Generic drug: glucose blood   OneTouch Delica Plus [id number] Misc   pyridostigmine 60 MG tablet Commonly known as: MESTINON Take 0.5 tablets (30 mg total) by mouth 2 (two) times daily.   rosuvastatin 5 MG tablet Commonly known as: CRESTOR Take 1 tablet (5 mg total) by mouth daily.   sitaGLIPtin 100 MG tablet Commonly known as: Januvia Take 1 tablet (100 mg total) by mouth daily.   Trilipix 135 MG capsule Generic drug: Choline Fenofibrate Take 135 mg by mouth daily.         Outstanding Labs/Studies   none  Duration of Discharge Encounter   Greater than 30 minutes  including physician time.  Mable Fill, PA-C 08/31/2019, 9:44 AM [phone]  Patient seen, examined. Available data reviewed. Agree with findings, assessment, and plan as outlined by Nell Range, PA-C.  The patient is interviewed and examined this morning.  He is alert, oriented, in no distress.  JVP is normal, lungs are clear, heart is regular rate and rhythm with a 2/6 systolic murmur at the right upper sternal border, no diastolic murmur, abdomen is soft and nontender, bilateral groin sites are clear with the exception of mild ecchymoses.  The right scrotal area remains erythematous but somewhat improved from yesterday's exam.  There is no pretibial edema.  The patient has done well this morning with walking and he was not orthostatic.  We discussed his intolerance  of Keflex because of significant nausea.  We are looking into an alternative for that at discharge.  The patient is medically stable for discharge to short-term skilled nursing.  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  Urology follow-up is arranged.  Cardiology follow-up is arranged.  The patient is stable for discharge from a cardiac and medical perspective.  Sherren Mocha, M.D. 08/31/2019 5:12 PM

## 2019-08-31 NOTE — Progress Notes (Addendum)
Progress Note  Patient Name: Alexis Griffith Date of Encounter: 08/31/2019  [facility] Cardiologist: Mertie Moores, MD   Subjective   feeling better  Inpatient Medications    Scheduled Meds: . aspirin  81 mg Oral Daily  . cephALEXin  500 mg Oral TID  . Chlorhexidine Gluconate Cloth  6 each Topical Daily  . clonazePAM  0.5 mg Oral QID  . fosfomycin  3 g Oral Q3 days  . insulin aspart  0-15 Units Subcutaneous TID WC  . insulin aspart  10 Units Subcutaneous TID WC  . insulin glargine  22 Units Subcutaneous Daily  . magnesium hydroxide  5 mL Oral Daily  . meloxicam  15 mg Oral Daily  . metoprolol tartrate  12.5 mg Oral BID  . midodrine  5 mg Oral TID AC  . polyethylene glycol  17 g Oral Daily  . pyridostigmine  30 mg Oral BID  . rosuvastatin  5 mg Oral Daily  . sodium chloride flush  3 mL Intravenous Q12H   Continuous Infusions: . sodium chloride 20 mL/hr at 08/17/19 2131   PRN Meds: sodium chloride, acetaminophen **OR** acetaminophen, bismuth subsalicylate, Glycerin (Adult), morphine injection, ondansetron (ZOFRAN) IV, oxyCODONE, sodium chloride flush, traMADol, zolpidem   Vital Signs    Vitals:   08/30/19 2340 08/31/19 0328 08/31/19 0757 08/31/19 0932  BP: (!) 121/43 (!) 155/50 (!) 140/59   Pulse:   80   Resp: 18 20 19    Temp: 98 F (36.7 C) 98.4 F (36.9 C) 97.7 F (36.5 C)   TempSrc: Oral Oral Oral   SpO2: 100% 100% 97%   Weight:    87.7 kg  Height:        Intake/Output Summary (Last 24 hours) at 08/31/2019 1053 Last data filed at 08/31/2019 0959 Gross per 24 hour  Intake --  Output 3750 ml  Net -3750 ml   Last 3 Weights 08/31/2019 08/30/2019 08/29/2019  Weight (lbs) 193 lb 5.5 oz 190 lb 11.2 oz 174 lb 6.1 oz  Weight (kg) 87.7 kg 86.5 kg 79.1 kg      Telemetry    SR 80's, rare PVC, souplet, one brief AT, HR is much more stable - Personally Reviewed  ECG    None new - Personally Reviewed  Physical Exam   GEN: No acute distress.  OOB to  chair Neck: No JVD Cardiac: RRR, no murmurs, rubs, or gallops.  Respiratory: CTA b/l. GI: Soft, nontender, non-distended, binder in place MS: No edema; No deformity. Compression hose on b/l Neuro:  Nonfocal  Psych: Normal affect   Labs    High Sensitivity Troponin:  No results for input(s): TROPONINIHS in the last 720 hours.    Chemistry Recent Labs  Lab 08/25/19 0429 08/28/19 1156  NA 133* 132*  K 3.7 3.6  CL 100 99  CO2 21* 23  GLUCOSE 170* 224*  BUN 18 9  CREATININE 0.99 0.87  CALCIUM 8.6* 9.0  GFRNONAA >60 >60  GFRAA >60 >60  ANIONGAP 12 10     Hematology Recent Labs  Lab 08/25/19 0429 08/28/19 1156  WBC 10.5 7.9  RBC 4.26 4.68  HGB 11.8* 12.7*  HCT 35.5* 38.2*  MCV 83.3 81.6  MCH 27.7 27.1  MCHC 33.2 33.2  RDW 13.0 12.8  PLT 155 210    BNPNo results for input(s): BNP, PROBNP in the last 168 hours.   DDimer No results for input(s): DDIMER in the last 168 hours.   Radiology    US  SCROTUM W/DOPPLER  Result Date: 08/30/2019 CLINICAL DATA:  Right testicular pain, swelling EXAM: SCROTAL ULTRASOUND DOPPLER ULTRASOUND OF THE TESTICLES TECHNIQUE: Complete ultrasound examination of the testicles, epididymis, and other scrotal structures was performed. Color and spectral Doppler ultrasound were also utilized to evaluate blood flow to the testicles. COMPARISON:  None. FINDINGS: Right testicle Measurements: 3.9 x 3.4 x 3.1 cm. No mass or microlithiasis visualized. Left testicle Measurements: 4.7 x 2.3 x 2.1 cm. No mass or microlithiasis visualized. Right epididymis: Enlarged, heterogeneous. Complex area in the epididymal head measuring up to 1.2 cm. Left epididymis:  Normal in size and appearance. Hydrocele:  None visualized. Varicocele:  None visualized. Pulsed Doppler interrogation of both testes demonstrates normal low resistance arterial and venous waveforms bilaterally. IMPRESSION: Heterogeneous, enlarged right epididymis may reflect epididymitis. There is a  complex hypoechoic area centrally within the epididymis which could reflect complex cyst/spermatocele or abscess. This could be followed after a course of antibiotic treatment. No evidence of testicular mass or torsion. Electronically Signed   By: Rolm Baptise M.D.   On: 08/30/2019 16:36    Cardiac Studies   08/16/2019: TTE IMPRESSIONS  1. Left ventricular ejection fraction, by estimation, is 55 to 60%. The  left ventricle has normal function. The left ventricle demonstrates  regional wall motion abnormalities (see scoring diagram/findings for  description). There is mild concentric left  ventricular hypertrophy. Indeterminate diastolic filling due to E-A  fusion.  2. Right ventricular systolic function is normal. The right ventricular  size is normal.  3. The mitral valve is normal in structure. No evidence of mitral valve  regurgitation. No evidence of mitral stenosis.  4. The aortic valve is normal in structure. Aortic valve regurgitation is  not visualized. No aortic stenosis is present. Aortic valve mean gradient  measures 11.7 mmHg. Aortic valve Vmax measures 2.48 m/s.  5. The inferior vena cava is normal in size with greater than 50%  respiratory variability, suggesting right atrial pressure of 3 mmHg.     07/07/2019: LHC  Ost LM lesion is 30% stenosed.  Prox LAD to Mid LAD lesion is 70% stenosed with 70% stenosed side branch in 2nd Diag. Both vessels positive by CT FFR  Mid LAD to Dist LAD lesion is 40% stenosed. Dist LAD lesion is 75% stenosed.  Prox Cx lesion is 45% stenosed. Prox Cx to Mid Cx lesion is 75% stenosed. RFR positive is 0.87  Mid Cx lesion is 5% stenosed with 50% stenosed side branch in 3rd Mrg. No significant drop in RFR reading beyond this lesion.  Ost RCA to Dist RCA lesion is 100% stenosed. Significant left to right collaterals fill the PDA and PL system.  RPDA lesion is 50% stenosed.  --------------------------------------  The left  ventricular systolic function is normal. The left ventricular ejection fraction is 55-65% by visual estimate. Regional wall motion knowledged and inferior wall.  LV end diastolic pressure is moderately elevated.  There is ~SEVERE AORTIC VALVE STENOSIS.  Patient Profile     79 y.o. 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  New onset and marked orthostatic hypotension associated with syncope  EP was consulted yesterday Dr. Caryl Comes recommended #1-out of bed is much as possible #2-bed in reverse Trendelenburg 15-20 degrees #3-discontinue fludrocortisone #4-begin Mestinon 30 mg twice daily #5-dose ProAmatine every 4 hours upon arising in the morning.  Not to be taken while he is supine #6-abdominal binder as he has but necessary only when he is not supine #7-when he goes home his thigh-high support stockings will probably be unmanageable.  Thigh sleeves can be obtained on the Internet and are much more effective than calf stockings.  Yesterday afternoon with mobility team Pre-mobility:              Sitting: 76 HR, 168/64 BP, 98% SpO2             Standing: 88 HR, 138/55 BP, 99% SpO2             Standing at 3 min: 85 HR, 142/55 BP, 98% SpO2 During mobility: 92 HR, 152/70 BP, 100% SpO2 Post-mobility: 81 HR, 154/58 BP, 99% SpO2   This AM with Cardiac rehab Pt walked 300 ft on RA with gait belt use and rolling walker and tolerated well. Took BP in hallway at 139/60 standing. Assisted to bathroom after walk. To recliner after assisting in  bathroom  Orthostatics this AM Supine 163/56, 84bpm Sitting 115/75, 86bpm Standing 129/50, 96bpm  He is planned for discharge Re-enforced strategies with the patient  Dr. Caryl Comes saw the patient early this AM  Follow up with primary cardiology/structural heart teams   For questions or updates, please contact [facility] Please consult [email or web address] for contact info under        Signed, Baldwin Jamaica, PA-C  08/31/2019, 10:53 AM     Orthostatics mcuh improved  Continue current recommendations

## 2019-08-31 NOTE — Progress Notes (Signed)
Called [facility] and gave report to RN Mia. All questions answered. Copy of AVS given to family and PTAR. [facility] transporting patient to facility. Payton Emerald, RN

## 2019-09-01 NOTE — Telephone Encounter (Signed)
Already called them and told them to cancel it. It will be filled through the SNF he is at

## 2019-09-01 NOTE — Telephone Encounter (Signed)
Pt's pharmacy is stating that this medication is no covered. Please address

## 2019-09-07 ENCOUNTER — Telehealth: Payer: Self-pay | Admitting: Cardiovascular Disease

## 2019-09-07 NOTE — Telephone Encounter (Signed)
Left message for patient to call back  

## 2019-09-07 NOTE — Telephone Encounter (Signed)
Its difficult for me to discuss the specifics since I was not involved in his hospitalization. If his BP is normal / high, then he does not need the midodrine so I dont have an issue with him refusing it in that case.

## 2019-09-07 NOTE — Telephone Encounter (Signed)
Pt would like to clarify his medications with Dr. Elmarie Shiley Nurse Sharyn Lull. He states that when he was in the hospital there were quite a few changes to his medication regimen. The new medications were sent to [facility] in [place] but he would like a copy of that list for himself. The patient is not at home at this time. Please use [phone])  the alternate phone number provided

## 2019-09-07 NOTE — Telephone Encounter (Signed)
Spoke with the patient who would like some clarification on his medications. He was discharged from the hospital on 07/01 and is now at [facility] for rehab.  Verified that Cardura and Hyzaar were discontinued. He is still taking Lopressor but states that they are only giving him 0.5 tablet daily. I advised him that he should be taking it twice daily. He said that he would check with the nurse. He also states that he has been refusing his midodrine. He states his blood pressures have been running 160s/70s and sometimes will get as high as 180s. Patient states that he has been feeling fine he just wants to make sure that he is on the right regimen for his blood pressure. Advised patient that I would reach out to Dr. Acie Fredrickson.  Patient has a follow up appt scheduled with Nell Range, PA-C on 07/28.

## 2019-09-08 ENCOUNTER — Inpatient Hospital Stay (HOSPITAL_COMMUNITY)
Admission: EM | Admit: 2019-09-08 | Discharge: 2019-09-13 | DRG: 281 | Disposition: A | Discharge status: Skilled Nursing Facility | Payer: Medicare Other | Attending: Cardiovascular Disease | Admitting: Cardiovascular Disease

## 2019-09-08 ENCOUNTER — Emergency Department (HOSPITAL_COMMUNITY): Payer: Medicare Other

## 2019-09-08 ENCOUNTER — Other Ambulatory Visit: Payer: Self-pay

## 2019-09-08 DIAGNOSIS — G4730 Sleep apnea, unspecified: Secondary | ICD-10-CM | POA: Diagnosis present

## 2019-09-08 DIAGNOSIS — R0602 Shortness of breath: Secondary | ICD-10-CM

## 2019-09-08 DIAGNOSIS — E119 Type 2 diabetes mellitus without complications: Secondary | ICD-10-CM

## 2019-09-08 DIAGNOSIS — I959 Hypotension, unspecified: Secondary | ICD-10-CM

## 2019-09-08 DIAGNOSIS — Z8673 Personal history of transient ischemic attack (TIA), and cerebral infarction without residual deficits: Secondary | ICD-10-CM

## 2019-09-08 DIAGNOSIS — R7989 Other specified abnormal findings of blood chemistry: Secondary | ICD-10-CM | POA: Diagnosis not present

## 2019-09-08 DIAGNOSIS — F419 Anxiety disorder, unspecified: Secondary | ICD-10-CM | POA: Diagnosis present

## 2019-09-08 DIAGNOSIS — C642 Malignant neoplasm of left kidney, except renal pelvis: Secondary | ICD-10-CM | POA: Diagnosis present

## 2019-09-08 DIAGNOSIS — I25119 Atherosclerotic heart disease of native coronary artery with unspecified angina pectoris: Secondary | ICD-10-CM | POA: Diagnosis present

## 2019-09-08 DIAGNOSIS — I350 Nonrheumatic aortic (valve) stenosis: Secondary | ICD-10-CM | POA: Diagnosis present

## 2019-09-08 DIAGNOSIS — Z79899 Other long term (current) drug therapy: Secondary | ICD-10-CM

## 2019-09-08 DIAGNOSIS — Z85810 Personal history of malignant neoplasm of tongue: Secondary | ICD-10-CM

## 2019-09-08 DIAGNOSIS — I491 Atrial premature depolarization: Secondary | ICD-10-CM

## 2019-09-08 DIAGNOSIS — G4733 Obstructive sleep apnea (adult) (pediatric): Secondary | ICD-10-CM | POA: Diagnosis present

## 2019-09-08 DIAGNOSIS — I2510 Atherosclerotic heart disease of native coronary artery without angina pectoris: Secondary | ICD-10-CM | POA: Diagnosis present

## 2019-09-08 DIAGNOSIS — Z888 Allergy status to other drugs, medicaments and biological substances status: Secondary | ICD-10-CM

## 2019-09-08 DIAGNOSIS — I160 Hypertensive urgency: Secondary | ICD-10-CM | POA: Diagnosis present

## 2019-09-08 DIAGNOSIS — R079 Chest pain, unspecified: Secondary | ICD-10-CM

## 2019-09-08 DIAGNOSIS — I5032 Chronic diastolic (congestive) heart failure: Secondary | ICD-10-CM | POA: Diagnosis present

## 2019-09-08 DIAGNOSIS — Z7982 Long term (current) use of aspirin: Secondary | ICD-10-CM

## 2019-09-08 DIAGNOSIS — D649 Anemia, unspecified: Secondary | ICD-10-CM

## 2019-09-08 DIAGNOSIS — I10 Essential (primary) hypertension: Secondary | ICD-10-CM

## 2019-09-08 DIAGNOSIS — Z882 Allergy status to sulfonamides status: Secondary | ICD-10-CM

## 2019-09-08 DIAGNOSIS — Z952 Presence of prosthetic heart valve: Secondary | ICD-10-CM

## 2019-09-08 DIAGNOSIS — N2889 Other specified disorders of kidney and ureter: Secondary | ICD-10-CM | POA: Diagnosis present

## 2019-09-08 DIAGNOSIS — Z8249 Family history of ischemic heart disease and other diseases of the circulatory system: Secondary | ICD-10-CM

## 2019-09-08 DIAGNOSIS — I951 Orthostatic hypotension: Secondary | ICD-10-CM | POA: Diagnosis present

## 2019-09-08 DIAGNOSIS — I700 Atherosclerosis of aorta: Secondary | ICD-10-CM | POA: Diagnosis present

## 2019-09-08 DIAGNOSIS — I779 Disorder of arteries and arterioles, unspecified: Secondary | ICD-10-CM | POA: Diagnosis present

## 2019-09-08 DIAGNOSIS — E785 Hyperlipidemia, unspecified: Secondary | ICD-10-CM | POA: Diagnosis present

## 2019-09-08 DIAGNOSIS — I214 Non-ST elevation (NSTEMI) myocardial infarction: Secondary | ICD-10-CM | POA: Diagnosis not present

## 2019-09-08 DIAGNOSIS — Z880 Allergy status to penicillin: Secondary | ICD-10-CM

## 2019-09-08 DIAGNOSIS — I110 Hypertensive heart disease with heart failure: Secondary | ICD-10-CM | POA: Diagnosis present

## 2019-09-08 DIAGNOSIS — C7800 Secondary malignant neoplasm of unspecified lung: Secondary | ICD-10-CM | POA: Diagnosis present

## 2019-09-08 DIAGNOSIS — R0989 Other specified symptoms and signs involving the circulatory and respiratory systems: Secondary | ICD-10-CM | POA: Diagnosis present

## 2019-09-08 DIAGNOSIS — N401 Enlarged prostate with lower urinary tract symptoms: Secondary | ICD-10-CM | POA: Diagnosis present

## 2019-09-08 DIAGNOSIS — Z87891 Personal history of nicotine dependence: Secondary | ICD-10-CM

## 2019-09-08 DIAGNOSIS — C649 Malignant neoplasm of unspecified kidney, except renal pelvis: Secondary | ICD-10-CM | POA: Diagnosis present

## 2019-09-08 DIAGNOSIS — Z20822 Contact with and (suspected) exposure to covid-19: Secondary | ICD-10-CM | POA: Diagnosis present

## 2019-09-08 DIAGNOSIS — N138 Other obstructive and reflux uropathy: Secondary | ICD-10-CM | POA: Diagnosis present

## 2019-09-08 DIAGNOSIS — R778 Other specified abnormalities of plasma proteins: Secondary | ICD-10-CM

## 2019-09-08 DIAGNOSIS — R339 Retention of urine, unspecified: Secondary | ICD-10-CM

## 2019-09-08 LAB — COMPREHENSIVE METABOLIC PANEL
ALT: 20 U/L (ref 0–44)
AST: 22 U/L (ref 15–41)
Albumin: 2.9 g/dL — ABNORMAL LOW (ref 3.5–5.0)
Alkaline Phosphatase: 50 U/L (ref 38–126)
Anion gap: 9 (ref 5–15)
BUN: 12 mg/dL (ref 8–23)
CO2: 23 mmol/L (ref 22–32)
Calcium: 9.4 mg/dL (ref 8.9–10.3)
Chloride: 101 mmol/L (ref 98–111)
Creatinine, Ser: 0.79 mg/dL (ref 0.61–1.24)
GFR calc Af Amer: >60 mL/min (ref >60)
GFR calc non Af Amer: >60 mL/min (ref >60)
Glucose, Bld: 218 mg/dL — ABNORMAL HIGH (ref 70–99)
Potassium: 3.4 mmol/L — ABNORMAL LOW (ref 3.5–5.1)
Sodium: 133 mmol/L — ABNORMAL LOW (ref 135–145)
Total Bilirubin: 0.4 mg/dL (ref 0.3–1.2)
Total Protein: 6.3 g/dL — ABNORMAL LOW (ref 6.5–8.1)

## 2019-09-08 LAB — CBC WITH DIFFERENTIAL/PLATELET
Abs Immature Granulocytes: 0.05 10*3/uL (ref 0.00–0.07)
Basophils Absolute: 0 10*3/uL (ref 0.0–0.1)
Basophils Relative: 1 %
Eosinophils Absolute: 0.2 10*3/uL (ref 0.0–0.5)
Eosinophils Relative: 2 %
HCT: 39.9 % (ref 39.0–52.0)
Hemoglobin: 13.1 g/dL (ref 13.0–17.0)
Immature Granulocytes: 1 %
Lymphocytes Relative: 15 %
Lymphs Abs: 1.3 10*3/uL (ref 0.7–4.0)
MCH: 26.8 pg (ref 26.0–34.0)
MCHC: 32.8 g/dL (ref 30.0–36.0)
MCV: 81.6 fL (ref 80.0–100.0)
Monocytes Absolute: 0.7 10*3/uL (ref 0.1–1.0)
Monocytes Relative: 7 %
Neutro Abs: 6.6 10*3/uL (ref 1.7–7.7)
Neutrophils Relative %: 74 %
Platelets: 232 10*3/uL (ref 150–400)
RBC: 4.89 MIL/uL (ref 4.22–5.81)
RDW: 13 % (ref 11.5–15.5)
WBC: 8.8 10*3/uL (ref 4.0–10.5)
nRBC: 0 % (ref 0.0–0.2)

## 2019-09-08 LAB — LACTIC ACID, PLASMA: Lactic Acid, Venous: 2 mmol/L (ref 0.5–1.9)

## 2019-09-08 LAB — MAGNESIUM: Magnesium: 1.6 mg/dL — ABNORMAL LOW (ref 1.7–2.4)

## 2019-09-08 LAB — TROPONIN I (HIGH SENSITIVITY): Troponin I (High Sensitivity): 190 ng/L (ref <18)

## 2019-09-08 MED ORDER — NITROGLYCERIN 0.4 MG SL SUBL: 0.4000 mg | SUBLINGUAL_TABLET | SUBLINGUAL | Status: DC | PRN

## 2019-09-08 NOTE — ED Triage Notes (Signed)
Pt BIB [facility] from [place] for rehab c/o CP and SOB. States SOB/CP starting today while at rest at 1900. Pt confirms recent valve replacement in June. Admits to stress from need of kidney removal, recent diagnosis of lung masses, and need to leave rehab facility by the 12th.   EMS gave 324 ASA and 2 NTG, without significant relief of CP.

## 2019-09-08 NOTE — Telephone Encounter (Signed)
Left message for patient to return my call.

## 2019-09-08 NOTE — H&P (Signed)
Cardiology H&P    Patient ID: Alexis Griffith MRN: [id number], DOB/AGE: Mar 17, 1940   Admit date: 09/08/2019 Date of Consult: 09/08/2019  Primary Physician: Haywood Pao, MD Primary Cardiologist: Sherren Mocha, MD  Patient Profile    Alexis Griffith is a 79 y.o. male recently discharged following TAVR that complicated postprocedural course, medically managed obstructive coronary artery disease, recently discovered renal cell carcinoma who presented with chest pain and severe hypertension.  History of Present Illness   Alexis Griffith is a 79 year old male with a history of hypertension, type 2 diabetes, prior CVA, multivessel obstructive CAD, and severe AS status post TAVR on June 15 who was recently discharged from our service to SNF for inpatient rehabilitation.  He presented this evening with severe chest pain at rest typical features.  Reports that he is anxious for multiple reasons including the prospect of a nephrectomy for his renal cell carcinoma and difficulties with renewal of his insurance approval for rehabilitation.  In the context of this he developed severe range hypertension with systolics in the 191Y on presentation.  He received nitroglycerin with prompt improvement in his blood pressure in the 160s.  On my evaluation he complained of 2 of 10 chest pain which is improved from 5-6 of 10 at presentation.  No significant shortness of breath, orthopnea, PND.  His last admission was complicated post procedurally primarily by urinary tract infection, failure to wean from a urinary catheter, and orthostatic hypotension for which he was discharged on Mid[place].  Given that he has been getting more hypotensive at his rehab facility, midodrine was stopped 2 days ago.  He is also stopped all antihypertensives and antianginals prior to last discharge.  He reports that he is [place] pretty well and able to get out of bed without any recurrence of his orthostatic symptoms.  Troponin trended  upwards from presentation at 190, 1300, 2700.  He is hypomagnesemic.  Otherwise chemistries and hematology largely unremarkable.  ECG shows normal sinus rhythm with nonspecific ST changes similar to prior.  I evaluated initially and thought presentation consistent with myocardial demand in the setting of severe hypertension, but given uptrending troponin transition to NSTEMI management.  Past Medical History   Past Medical History:  Diagnosis Date  . Anxiety   . CAD (coronary artery disease)   . Carotid artery disease ([place]) 08/24/2011  . Coronary artery disease involving native coronary artery of native heart with angina pectoris ([place]) 07/04/2019  . Diabetes mellitus without complication ([place])   . Excessive urination at night 06/05/2013  . Giant cell arteritis ([place]) 05/17/2012  . Hyperlipidemia   . Hypertension   . PVC (premature ventricular contraction)   . Renal mass   . 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 Burt Knack & Dr. Cyndia Bent  . Severe aortic stenosis   . Sinus bradycardia on ECG   . Sleep apnea   . 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 Wilburn Cornelia.  . Stroke ([place])   . TIA (transient ischemic attack)     Past Surgical History:  Procedure Laterality Date  . CATARACT EXTRACTION, BILATERAL  10/2015, and 11/2015   with adding  TORIC lenses bilaterally   . INTRAVASCULAR PRESSURE WIRE/FFR STUDY N/A 07/07/2019   Procedure: INTRAVASCULAR PRESSURE WIRE/FFR STUDY;  Surgeon: [name], MD;  Location: [place] [facility];  Service: Cardiovascular;  Laterality: N/A;  . LEFT HEART  CATH AND CORONARY ANGIOGRAPHY N/A 07/07/2019   Procedure: LEFT HEART CATH AND CORONARY ANGIOGRAPHY;  Surgeon: [name], MD;  Location: [place] [facility];  Service: Cardiovascular;  Laterality: N/A;  . SQUAMOUS CELL CARCINOMA EXCISION     tongue  . TEE WITHOUT CARDIOVERSION N/A 08/15/2019    Procedure: TRANSESOPHAGEAL ECHOCARDIOGRAM (TEE);  Surgeon: Sherren Mocha, MD;  Location: [place];  Service: Open Heart Surgery;  Laterality: N/A;  . TRANSCATHETER AORTIC VALVE REPLACEMENT, TRANSFEMORAL N/A 08/15/2019   Procedure: TRANSCATHETER AORTIC VALVE REPLACEMENT, TRANSFEMORAL using Edwards [employer] SAPIEN 3 Ultra 26 mm THV.;  Surgeon: Sherren Mocha, MD;  Location: [place];  Service: Open Heart Surgery;  Laterality: N/A;     Allergies  Allergen Reactions  . Macrodantin [Nitrofurantoin] Other (See Comments)    "blocked kidneys"   . Sulfa Antibiotics Rash  . Lisinopril Cough  . Penicillins     08/30/19- Patient can't remember, was 35+ years ago. OK with trying cephalexin in hospital   Family History    Family History  Problem Relation Age of Onset  . Heart disease Mother   . Heart attack Mother   . Heart disease Father   . Heart attack Father    He indicated that his mother is deceased. He indicated that his father is deceased. He indicated that his maternal grandmother is deceased. He indicated that his maternal grandfather is deceased. He indicated that his paternal grandmother is deceased. He indicated that his paternal grandfather is deceased.   Social History    Social History   Socioeconomic History  . Marital status: Married    Spouse name: Not on file  . Number of children: 2  . Years of education: Not on file  . Highest education level: Not on file  Occupational History  . Not on file  Tobacco Use  . Smoking status: Former Smoker    Quit date: 06/25/1975    Years since quitting: 44.2  . Smokeless tobacco: Never Used  Vaping Use  . Vaping Use: Never used  Substance and Sexual Activity  . Alcohol use: No  . Drug use: No  . Sexual activity: Yes  Other Topics Concern  . Not on file  Social History Narrative   Patient lives at home with his wife.    Social Determinants of Health   Financial Resource Strain:   . Difficulty of Paying Living Expenses:     Food Insecurity:   . Worried About Charity fundraiser in the Last Year:   . Arboriculturist in the Last Year:   Transportation Needs:   . Film/video editor (Medical):   Marland Kitchen Lack of Transportation (Non-Medical):   Physical Activity:   . Days of Exercise per Week:   . Minutes of Exercise per Session:   Stress:   . Feeling of Stress :   Social Connections:   . Frequency of Communication with Friends and Family:   . Frequency of Social Gatherings with Friends and Family:   . Attends Religious Services:   . Active Member of Clubs or Organizations:   . Attends Archivist Meetings:   Marland Kitchen Marital Status:   Intimate Partner Violence:   . Fear of Current or Ex-Partner:   . Emotionally Abused:   Marland Kitchen Physically Abused:   . Sexually Abused:      Review of Systems    General:  No chills, fever, night sweats or weight changes.  Cardiovascular:chest pain, dyspnea on exertion, edema, orthopnea, palpitations,  paroxysmal nocturnal dyspnea. Dermatological: No rash, lesions/masses Respiratory: No cough, dyspnea Urologic: No hematuria, dysuria Abdominal:   No nausea, vomiting, diarrhea, bright red blood per rectum, melena, or hematemesis Neurologic:  No visual changes, wkns, changes in mental status. All other systems reviewed and are otherwise negative except as noted above.  Physical Exam    Blood pressure (!) 179/76, pulse (!) 103, resp. rate 16, height 5\' 10"  (1.778 m), weight 87.7 kg, SpO2 98 %.    No intake or output data in the 24 hours ending 09/08/19 2153 Wt Readings from Last 3 Encounters:  09/08/19 87.7 kg  08/31/19 87.7 kg  08/11/19 91.3 kg    CONSTITUTIONAL: alert and conversant, well-appearing, nourished, no distress HEENT: oropharynx clear and moist, no mucosal lesions, normal dentition, conjunctiva normal, EOM intact, pupils equal, no lid lag. NECK: supple, no cervical adenopathy, no thyromegaly CARDIOVASCULAR: Regular rhythm. Faint II/VI Systolic murmur RUSB,  no gallop or rub. Normal S1/S2. Radial pulses intact. JVP flat. No carotid bruits. PULMONARY/CHEST WALL: no deformities, normal breath sounds bilaterally, normal work of breathing ABDOMINAL: soft, non-tender, non-distended EXTREMITIES: no edema or muscle atrophy, warm and well-perfused SKIN: Dry and intact without apparent rashes or wounds.  NEUROLOGIC: alert, normal gait, no abnormal movements, cranial nerves grossly intact.   Labs    Troponin (Point of Care Test) No results for input(s): TROPIPOC in the last 72 hours. No results for input(s): CKTOTAL, CKMB, TROPONINI in the last 72 hours. Lab Results  Component Value Date   WBC 7.9 08/28/2019   HGB 12.7 (L) 08/28/2019   HCT 38.2 (L) 08/28/2019   MCV 81.6 08/28/2019   PLT 210 08/28/2019   No results for input(s): NA, K, CL, CO2, BUN, CREATININE, CALCIUM, PROT, BILITOT, ALKPHOS, ALT, AST, GLUCOSE in the last 168 hours.  Invalid input(s): LABALBU Lab Results  Component Value Date   CHOL 99 08/17/2019   HDL 35 (L) 08/17/2019   LDLCALC 45 08/17/2019   TRIG 96 08/17/2019   No results found for: [facility]   Radiology Studies    CT ANGIO HEAD W OR WO CONTRAST  Result Date: 08/16/2019 CLINICAL DATA:  Stroke follow-up. Subcentimeter left frontal infarct on MRI. EXAM: CT ANGIOGRAPHY HEAD AND NECK TECHNIQUE: Multidetector CT imaging of the head and neck was performed using the standard protocol during bolus administration of intravenous contrast. Multiplanar CT image reconstructions and MIPs were obtained to evaluate the vascular anatomy. Carotid stenosis measurements (when applicable) are obtained utilizing NASCET criteria, using the distal internal carotid diameter as the denominator. CONTRAST:  96mL OMNIPAQUE IOHEXOL 350 MG/ML SOLN COMPARISON:  Head MRI 08/16/2019.  Head and neck CTA 12/31/2011. FINDINGS: CT HEAD FINDINGS Brain: The punctate medial left frontal cortical infarct on MRI is not well demonstrated by CT. No acute large territory  infarct, intracranial hemorrhage, mass, midline shift, or extra-axial fluid collection is identified. The ventricles and sulci are within normal limits for age. Vascular: Calcified atherosclerosis at the skull base. Skull: No fracture or suspicious osseous lesion. Sinuses: Minimal mucosal thickening in the paranasal sinuses. Clear mastoid air cells. Orbits: Bilateral cataract extraction. Review of the MIP images confirms the above findings CTA NECK FINDINGS Aortic arch: Standard 3 vessel aortic arch with mild atherosclerotic plaque. No significant arch vessel origin stenosis. Right carotid system: Patent with prominent, largely calcified plaque at the carotid bifurcation resulting in progressive high-grade stenosis of the distal common and proximal internal and external carotid arteries. Luminal visualization is limited by the calcified plaque, however ICA origin stenosis  is estimated at 75%. Left carotid system: Patent with predominantly calcified plaque at the carotid bifurcation resulting in progressive ICA origin stenosis estimated at 65-70%. Vertebral arteries: Patent and strongly dominant left vertebral artery with mild origin stenosis due to calcified plaque, unchanged. Diminutive right vertebral artery with chronic occlusion beginning at the V3 level. Skeleton: No acute osseous abnormality or suspicious osseous lesion. Other neck: No evidence of cervical lymphadenopathy or mass. Upper chest: Small pulmonary nodules including a 9 x 4 mm nodule in the left upper lobe, more fully evaluated on chest CT and PET-CT last month. Mild dependent atelectasis in both lungs. Review of the MIP images confirms the above findings CTA HEAD FINDINGS Anterior circulation: The internal carotid arteries are patent from skull base to carotid termini with asymmetric atherosclerotic plaque on the left resulting in mild left cavernous and proximal supraclinoid stenoses, similar to the prior CTA. ACAs and MCAs are patent without  evidence of a proximal branch occlusion. There are new moderate right and mild left M1 stenoses. The right A1 segment is dominant. An infundibulum is noted at the right posterior communicating origin. Posterior circulation: The intracranial left vertebral artery is widely patent and supplies the basilar. 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 The basilar artery is widely patent. Patent SCAs are seen bilaterally. Both PCAs are patent with mild irregularity but no significant proximal stenosis. No aneurysm is identified. Venous sinuses: Patent. Anatomic variants: None of significance. Review of the MIP images confirms the above findings IMPRESSION: 1. Progressive cervical carotid atherosclerosis with 75% right and 65-70% left proximal ICA stenoses. 2. 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 3. New moderate right and mild left M1 stenoses. 4. Unchanged mild left intracranial ICA stenosis. 5. Aortic Atherosclerosis (ICD10-I70.0). Electronically Signed   By: Logan Bores M.D.   On: 08/16/2019 20:54   DG Chest 2 View  Result Date: 08/11/2019 CLINICAL DATA:  Pre-admission for TAVR. History of diabetes and sleep apnea. EXAM: CHEST - 2 VIEW COMPARISON:  Radiographs 11/11/2010 and CT 07/14/2019. PET-CT 07/27/2019. FINDINGS: The heart size and mediastinal contours are stable. Tiny left upper lobe nodule may be calcified and appears stable. Other nodules seen on PET-CT are not clearly visualized. The lungs are otherwise clear. No pleural effusion or pneumothorax. No acute osseous findings. IMPRESSION: No active cardiopulmonary process. Electronically Signed   By: Richardean Sale M.D.   On: 08/11/2019 09:47   CT ANGIO NECK W OR WO CONTRAST  Result Date: 08/16/2019 CLINICAL DATA:  Stroke follow-up. Subcentimeter left frontal infarct on MRI. EXAM: CT ANGIOGRAPHY HEAD AND NECK TECHNIQUE: Multidetector CT imaging of the head and neck was performed using the standard  protocol during bolus administration of intravenous contrast. Multiplanar CT image reconstructions and MIPs were obtained to evaluate the vascular anatomy. Carotid stenosis measurements (when applicable) are obtained utilizing NASCET criteria, using the distal internal carotid diameter as the denominator. CONTRAST:  4mL OMNIPAQUE IOHEXOL 350 MG/ML SOLN COMPARISON:  Head MRI 08/16/2019.  Head and neck CTA 12/31/2011. FINDINGS: CT HEAD FINDINGS Brain: The punctate medial left frontal cortical infarct on MRI is not well demonstrated by CT. No acute large territory infarct, intracranial hemorrhage, mass, midline shift, or extra-axial fluid collection is identified. The ventricles and sulci are within normal limits for age. Vascular: Calcified atherosclerosis at the skull base. Skull: No fracture or suspicious osseous lesion. Sinuses: Minimal mucosal thickening in the paranasal sinuses. Clear mastoid air cells. Orbits: Bilateral cataract extraction. Review of the MIP images  confirms the above findings CTA NECK FINDINGS Aortic arch: Standard 3 vessel aortic arch with mild atherosclerotic plaque. No significant arch vessel origin stenosis. Right carotid system: Patent with prominent, largely calcified plaque at the carotid bifurcation resulting in progressive high-grade stenosis of the distal common and proximal internal and external carotid arteries. Luminal visualization is limited by the calcified plaque, however ICA origin stenosis is estimated at 75%. Left carotid system: Patent with predominantly calcified plaque at the carotid bifurcation resulting in progressive ICA origin stenosis estimated at 65-70%. Vertebral arteries: Patent and strongly dominant left vertebral artery with mild origin stenosis due to calcified plaque, unchanged. Diminutive right vertebral artery with chronic occlusion beginning at the V3 level. Skeleton: No acute osseous abnormality or suspicious osseous lesion. Other neck: No evidence of  cervical lymphadenopathy or mass. Upper chest: Small pulmonary nodules including a 9 x 4 mm nodule in the left upper lobe, more fully evaluated on chest CT and PET-CT last month. Mild dependent atelectasis in both lungs. Review of the MIP images confirms the above findings CTA HEAD FINDINGS Anterior circulation: The internal carotid arteries are patent from skull base to carotid termini with asymmetric atherosclerotic plaque on the left resulting in mild left cavernous and proximal supraclinoid stenoses, similar to the prior CTA. ACAs and MCAs are patent without evidence of a proximal branch occlusion. There are new moderate right and mild left M1 stenoses. The right A1 segment is dominant. An infundibulum is noted at the right posterior communicating origin. Posterior circulation: The intracranial left vertebral artery is widely patent and supplies the basilar. 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 The basilar artery is widely patent. Patent SCAs are seen bilaterally. Both PCAs are patent with mild irregularity but no significant proximal stenosis. No aneurysm is identified. Venous sinuses: Patent. Anatomic variants: None of significance. Review of the MIP images confirms the above findings IMPRESSION: 1. Progressive cervical carotid atherosclerosis with 75% right and 65-70% left proximal ICA stenoses. 2. 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 3. New moderate right and mild left M1 stenoses. 4. Unchanged mild left intracranial ICA stenosis. 5. Aortic Atherosclerosis (ICD10-I70.0). Electronically Signed   By: Logan Bores M.D.   On: 08/16/2019 20:54   MR BRAIN WO CONTRAST  Result Date: 08/16/2019 CLINICAL DATA:  Ataxia.  Rule out stroke.  TAVR 08/15/2019 EXAM: MRI HEAD WITHOUT CONTRAST TECHNIQUE: Multiplanar, multiecho pulse sequences of the brain and surrounding structures were obtained without intravenous contrast. COMPARISON:  MRI head 01/18/2012  FINDINGS: Brain: Small focus of acute infarction in the left medial frontal lobe. This measures approximately 4 mm in diameter. No other acute infarct. Moderate atrophy. Negative for hydrocephalus. Normal white matter. Negative for hemorrhage or mass. Vascular: Normal arterial flow voids. Distal right vertebral artery appears hypoplastic unchanged from prior studies. Skull and upper cervical spine: No focal skeletal lesion. Sinuses/Orbits: Moderate mucosal edema paranasal cataract extraction sinuses. Bilateral Other: None IMPRESSION: 4 mm acute infarct left medial frontal cortex Generalized atrophy.  No significant chronic ischemic change. Electronically Signed   By: Franchot Gallo M.D.   On: 08/16/2019 14:57   CT ABDOMEN PELVIS W CONTRAST  Result Date: 08/24/2019 CLINICAL DATA:  79 year old male with abdominal pain, nausea vomiting. EXAM: CT ABDOMEN AND PELVIS WITH CONTRAST TECHNIQUE: Multidetector CT imaging of the abdomen and pelvis was performed using the standard protocol following bolus administration of intravenous contrast. CONTRAST:  189mL OMNIPAQUE IOHEXOL 300 MG/ML  SOLN COMPARISON:  None. FINDINGS: Lower chest: The visualized  lung bases are clear. There is coronary vascular calcification. No intra-abdominal free air or free fluid. Hepatobiliary: The liver is unremarkable. No intrahepatic biliary duct dilatation. There is a small stone within gallbladder. No pericholecystic fluid or evidence of acute cholecystitis by CT. Pancreas: Unremarkable. No pancreatic ductal dilatation or surrounding inflammatory changes. Spleen: Normal in size without focal abnormality. Adrenals/Urinary Tract: The adrenal glands are unremarkable. There is a 5.3 x 8.7 x 7.0 cm heterogeneously enhancing, partially exophytic mass from the lateral interpolar left kidney with areas central necrosis most consistent with malignancy. Further characterization with renal mass protocol MRI is recommended. Several small cysts measure up  to 5 cm in the upper pole of the left kidney. Subcentimeter right renal hypodense focus is too small to characterize. Several small nonobstructing bilateral renal calculi measure up to 3 mm in the upper pole of the left kidney. There is no hydronephrosis on either side. There is symmetric enhancement and excretion of contrast by both kidneys. The visualized ureters and urinary bladder appear unremarkable. Stomach/Bowel: There is sigmoid diverticulosis and scattered colonic diverticula without active inflammatory changes. There is no bowel obstruction or active inflammation. Focal stranding adjacent to the left paracolic gutter likely reactive to left renal mass. The appendix is normal. Vascular/Lymphatic: Advanced aortoiliac atherosclerotic disease. The IVC is unremarkable. No portal venous gas. There is no adenopathy. Reproductive: The prostate gland is enlarged measuring 7 cm in transverse axial diameter. Other: Right inguinal subcutaneous stranding, possibly related to recent procedure. Clinical correlation is recommended. No large fluid collection or hematoma. Musculoskeletal: No acute osseous pathology. No suspicious osseous lesion. Bilateral L5 pars defects with grade 1 L5-S1 anterolisthesis. IMPRESSION: 1. Left renal interpolar mass most consistent with malignancy. Further characterization with renal mass protocol MRI is recommended. 2. Colonic diverticulosis. No bowel obstruction. Normal appendix. 3. Cholelithiasis. 4. Enlarged prostate gland. 5. Bilateral L5 pars defects with grade 1 L5-S1 anterolisthesis. 6. Aortic Atherosclerosis (ICD10-I70.0). Electronically Signed   By: Anner Crete M.D.   On: 08/24/2019 19:50   US SCROTUM W/DOPPLER  Result Date: 08/30/2019 CLINICAL DATA:  Right testicular pain, swelling EXAM: SCROTAL ULTRASOUND DOPPLER ULTRASOUND OF THE TESTICLES TECHNIQUE: Complete ultrasound examination of the testicles, epididymis, and other scrotal structures was performed. Color and  spectral Doppler ultrasound were also utilized to evaluate blood flow to the testicles. COMPARISON:  None. FINDINGS: Right testicle Measurements: 3.9 x 3.4 x 3.1 cm. No mass or microlithiasis visualized. Left testicle Measurements: 4.7 x 2.3 x 2.1 cm. No mass or microlithiasis visualized. Right epididymis: Enlarged, heterogeneous. Complex area in the epididymal head measuring up to 1.2 cm. Left epididymis:  Normal in size and appearance. Hydrocele:  None visualized. Varicocele:  None visualized. Pulsed Doppler interrogation of both testes demonstrates normal low resistance arterial and venous waveforms bilaterally. IMPRESSION: Heterogeneous, enlarged right epididymis may reflect epididymitis. There is a complex hypoechoic area centrally within the epididymis which could reflect complex cyst/spermatocele or abscess. This could be followed after a course of antibiotic treatment. No evidence of testicular mass or torsion. Electronically Signed   By: Rolm Baptise M.D.   On: 08/30/2019 16:36   VAS US CAROTID  Result Date: 08/18/2019 Carotid Arterial Duplex Study Indications:       CVA and CTA showed carotid stenosis progression, but will                    need to compare with carotid doppler. Risk Factors:      Hypertension, hyperlipidemia, Diabetes, prior CVA. Comparison Study:  06/01/19 previous Performing Technologist: Abram Sander RVS  Examination Guidelines: A complete evaluation includes B-mode imaging, spectral Doppler, color Doppler, and power Doppler as needed of all accessible portions of each vessel. Bilateral testing is considered an integral part of a complete examination. Limited examinations for reoccurring indications may be performed as noted.  Right Carotid Findings: +----------+--------+--------+--------+------------------+--------+           PSV cm/sEDV cm/sStenosisPlaque DescriptionComments +----------+--------+--------+--------+------------------+--------+ CCA Prox  78      7                heterogenous               +----------+--------+--------+--------+------------------+--------+ CCA Distal53      7               heterogenous               +----------+--------+--------+--------+------------------+--------+ ICA Prox  305     45      40-59%  heterogenous               +----------+--------+--------+--------+------------------+--------+ ICA Mid   132     24                                         +----------+--------+--------+--------+------------------+--------+ ICA Distal77      17                                         +----------+--------+--------+--------+------------------+--------+ ECA       541                                                +----------+--------+--------+--------+------------------+--------+ +----------+--------+-------+--------+-------------------+           PSV cm/sEDV cmsDescribeArm Pressure (mmHG) +----------+--------+-------+--------+-------------------+ Subclavian109                                        +----------+--------+-------+--------+-------------------+ +---------+--------+--+--------+---------+ VertebralPSV cm/s35EDV cm/sAntegrade +---------+--------+--+--------+---------+  Left Carotid Findings: +----------+--------+--------+--------+------------------+--------+           PSV cm/sEDV cm/sStenosisPlaque DescriptionComments +----------+--------+--------+--------+------------------+--------+ CCA Prox  123                     heterogenous               +----------+--------+--------+--------+------------------+--------+ CCA Distal130                     heterogenous               +----------+--------+--------+--------+------------------+--------+ ICA Prox  137     35      1-39%   heterogenous               +----------+--------+--------+--------+------------------+--------+ ICA Distal97      17                                          +----------+--------+--------+--------+------------------+--------+ ECA       167                                                +----------+--------+--------+--------+------------------+--------+ +----------+--------+--------+--------+-------------------+  PSV cm/sEDV cm/sDescribeArm Pressure (mmHG) +----------+--------+--------+--------+-------------------+ Subclavian115                                         +----------+--------+--------+--------+-------------------+ +---------+--------+--+--------+--+---------+ VertebralPSV cm/s44EDV cm/s12Antegrade +---------+--------+--+--------+--+---------+   Summary: Right Carotid: 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 Left Carotid: Velocities in the left ICA are consistent with a 1-39% stenosis. Vertebrals: Bilateral vertebral arteries demonstrate antegrade flow. *See table(s) above for measurements and observations.  Electronically signed by Antony Contras MD on 08/18/2019 at 11:00:32 AM.    Final    ECHOCARDIOGRAM LIMITED  Result Date: 08/16/2019    ECHOCARDIOGRAM REPORT   Patient Name:   MICHIAL DISNEY Date of Exam: 08/16/2019 Medical Rec #:  [id number]      Height:       70.0 in Accession #:    [id number]     Weight:       195.1 lb Date of Birth:  [birth year]      BSA:          2.065 m Patient Age:    2 years       BP:           146/60 mmHg Patient Gender: M              HR:           91 bpm. Exam Location:  Inpatient Procedure: Limited Echo, Cardiac Doppler and Color Doppler Indications:    Post TAVR evaluation  History:        Patient has prior history of Echocardiogram examinations, most                 recent 08/15/2019. CHF, CAD, Arrythmias:PVC; Risk                 Factors:Hypertension, Diabetes, Sleep Apnea and Former Smoker.                 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  Sonographer:    Clayton Lefort RDCS (AE) Referring Phys: [id number] [place]  1. Left ventricular  ejection fraction, by estimation, is 55 to 60%. The left ventricle has normal function. The left ventricle demonstrates regional wall motion abnormalities (see scoring diagram/findings for description). There is mild concentric left ventricular hypertrophy. Indeterminate diastolic filling due to E-A fusion.  2. Right ventricular systolic function is normal. The right ventricular size is normal.  3. The mitral valve is normal in structure. No evidence of mitral valve regurgitation. No evidence of mitral stenosis.  4. The aortic valve is normal in structure. Aortic valve regurgitation is not visualized. No aortic stenosis is present. Aortic valve mean gradient measures 11.7 mmHg. Aortic valve Vmax measures 2.48 m/s.  5. The inferior vena cava is normal in size with greater than 50% respiratory variability, suggesting right atrial pressure of 3 mmHg. FINDINGS  Left Ventricle: Left ventricular ejection fraction, by estimation, is 55 to 60%. The left ventricle has normal function. The left ventricle demonstrates regional wall motion abnormalities. The left ventricular internal cavity size was normal in size. There is mild concentric left ventricular hypertrophy. Indeterminate diastolic filling due to E-A fusion.  LV Wall Scoring: The basal inferolateral segment and basal inferior segment are hypokinetic. Right Ventricle: The right ventricular size is normal. No increase in  right ventricular wall thickness. Right ventricular systolic function is normal. Left Atrium: Left atrial size was normal in size. Right Atrium: Right atrial size was normal in size. Pericardium: There is no evidence of pericardial effusion. Mitral Valve: The mitral valve is normal in structure. Normal mobility of the mitral valve leaflets. No evidence of mitral valve regurgitation. No evidence of mitral valve stenosis. Tricuspid Valve: The tricuspid valve is normal in structure. Tricuspid valve regurgitation is not demonstrated. No evidence of  tricuspid stenosis. Aortic Valve: The aortic valve is normal in structure. Aortic valve regurgitation is not visualized. No aortic stenosis is present. Aortic valve mean gradient measures 11.7 mmHg. Aortic valve peak gradient measures 24.6 mmHg. Aortic valve area, by VTI measures 1.72 cm. Pulmonic Valve: The pulmonic valve was normal in structure. Pulmonic valve regurgitation is not visualized. No evidence of pulmonic stenosis. Aorta: The aortic root is normal in size and structure. Venous: The inferior vena cava is normal in size with greater than 50% respiratory variability, suggesting right atrial pressure of 3 mmHg. IAS/Shunts: No atrial level shunt detected by color flow Doppler.  LEFT VENTRICLE PLAX 2D LVIDd:         4.80 cm LVIDs:         3.60 cm LV PW:         1.00 cm LV IVS:        1.50 cm LVOT diam:     2.60 cm LV SV:         71 LV SV Index:   34 LVOT Area:     5.31 cm  IVC IVC diam: 1.70 cm LEFT ATRIUM         Index LA diam:    4.10 cm 1.99 cm/m  AORTIC VALVE AV Area (Vmax):    1.85 cm AV Area (Vmean):   1.91 cm AV Area (VTI):     1.72 cm AV Vmax:           248.00 cm/s AV Vmean:          157.000 cm/s AV VTI:            0.413 m AV Peak Grad:      24.6 mmHg AV Mean Grad:      11.7 mmHg LVOT Vmax:         86.20 cm/s LVOT Vmean:        56.400 cm/s LVOT VTI:          0.134 m LVOT/AV VTI ratio: 0.32  AORTA Ao Root diam: 2.90 cm  SHUNTS Systemic VTI:  0.13 m Systemic Diam: 2.60 cm Sanda Klein MD Electronically signed by Sanda Klein MD Signature Date/Time: 08/16/2019/12:53:54 PM    Final    ECHOCARDIOGRAM LIMITED  Result Date: 08/15/2019    ECHOCARDIOGRAM LIMITED REPORT   Patient Name:   APOLINAR BERO Date of Exam: 08/15/2019 Medical Rec #:  [id number]      Height:       70.0 in Accession #:    [id number]     Weight:       200.0 lb Date of Birth:  [birth year]      BSA:          2.087 m Patient Age:    46 years       BP:           170/43 mmHg Patient Gender: M              HR:  63 bpm. Exam  Location:  Inpatient Procedure: Limited Echo, Cardiac Doppler and Color Doppler Indications:     Aortic stenosis  History:         Patient has prior history of Echocardiogram examinations, most                  recent 07/18/2019. CAD, Carotid Disease and TIA, Aortic Valve                  Disease; Risk Factors:Hypertension, Dyslipidemia and Diabetes.                  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 Procedure Date:                  08/15/2019.  Sonographer:     Dustin Flock Referring Phys:  [id number] Woodfin Ganja THOMPSON Diagnosing Phys: Sanda Klein MD                   PREOPERATIVE FINDINGS:                   Normal left ventricular systolic function, Estimated LV                  ejection fraction 55-60%. Basal inferolateral mild hypokinesis.                  Trileaflet aortic valve with severe calcific stenosis. Mild                  aortic regurgitation.                  Peak aortic valve gradient 56 mm Hg, mean gradient 33 mm Hg,                  dimensionless obstructive index 0.18, estimated aortic valve                  area 0.6 cm sq.                  No mitral insufficiency.                  No pericardial effusion.                   POST-PROCEDURE FINDINGS                   Normal left ventricular systolic function, Estimated LV                  ejection fraction 55-60%. Basal inferolateral mild hypokinesis.                  Well seated aortic stent-valve TAVR. No aortic regurgitation.                  No perivalvular leak.                  Peak aortic valve gradient 12 mm Hg, mean gradient 6 mm Hg,                  dimensionless obstructive index 0.67, estimated aortic valve                  area 2.14 cm sq.  Mild mitral insufficiency.                  No pericardial effusion. IMPRESSIONS  1. Left ventricular ejection fraction, by estimation, is 55 to 60%. The left ventricle has normal function. The  left ventricle demonstrates regional wall motion abnormalities (see scoring diagram/findings for description). There is mild concentric left ventricular hypertrophy. Left ventricular diastolic function could not be evaluated.  2. Right ventricular systolic function is normal. Tricuspid regurgitation signal is inadequate for assessing PA pressure.  3. Left atrial size was mildly dilated.  4. The mitral valve is normal in structure. Mild mitral valve regurgitation.  5. The aortic valve has been repaired/replaced. Aortic valve regurgitation is not visualized. Severe aortic valve stenosis. 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 Procedure Date: 08/15/2019. Echo findings  are consistent with normal structure and function of the aortic valve prosthesis. FINDINGS  Left Ventricle: Left ventricular ejection fraction, by estimation, is 55 to 60%. The left ventricle has normal function. The left ventricle demonstrates regional wall motion abnormalities. The left ventricular internal cavity size was normal in size. There is mild concentric left ventricular hypertrophy.  LV Wall Scoring: The basal inferolateral segment and basal inferior segment are hypokinetic. Right Ventricle: No increase in right ventricular wall thickness. Right ventricular systolic function is normal. Tricuspid regurgitation signal is inadequate for assessing PA pressure. Left Atrium: Left atrial size was mildly dilated. Right Atrium: Right atrial size was normal in size. Pericardium: There is no evidence of pericardial effusion. Mitral Valve: The mitral valve is normal in structure. There is mild thickening of the mitral valve leaflet(s). Mild mitral valve regurgitation, with posteriorly-directed jet. Tricuspid Valve: The tricuspid valve is normal in structure. Aortic Valve: The aortic valve has been repaired/replaced.. There is severe thickening and severe calcifcation of the aortic valve. Aortic valve  regurgitation is not visualized. Severe aortic stenosis is present. There is severe thickening of the aortic valve. There is severe calcifcation of the aortic valve. Aortic valve mean gradient measures 6.0 mmHg. Aortic valve peak gradient measures 12.4 mmHg. Aortic valve area, by VTI measures 2.23 cm. 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 Procedure Date: 08/15/2019. Echo findings are consistent with normal structure and function of the aortic valve prosthesis. Pulmonic Valve: The pulmonic valve was not assessed. Aorta: The aortic root and ascending aorta are structurally normal, with no evidence of dilitation. IAS/Shunts: The interatrial septum was not assessed.  LEFT VENTRICLE PLAX 2D LVOT diam:     2.07 cm LV SV:         90 LV SV Index:   43 LVOT Area:     3.36 cm  AORTIC VALVE AV Area (Vmax):    2.08 cm AV Area (Vmean):   2.08 cm AV Area (VTI):     2.23 cm AV Vmax:           176.00 cm/s AV Vmean:          116.000 cm/s AV VTI:            0.404 m AV Peak Grad:      12.4 mmHg AV Mean Grad:      6.0 mmHg LVOT Vmax:         109.00 cm/s LVOT Vmean:        71.800 cm/s LVOT VTI:          0.269 m LVOT/AV VTI ratio:  0.67  SHUNTS Systemic VTI:  0.27 m Systemic Diam: 2.07 cm Sanda Klein MD Electronically signed by Sanda Klein MD Signature Date/Time: 08/15/2019/10:06:02 AM    Final    Structural Heart Procedure  Result Date: 08/15/2019 See surgical note for result.   ECG & Cardiac Imaging   Limited echo June 16  mild concentric LVH, indeterminate filling pattern, normal LVEF, inferior hypokinesis, normal RV function and size, status post TAVR without gradient.  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  He had a recent complicated post procedural course with orthostatic hypotension, complicated urinary tract infection, and epididymitis and has been at skilled nursing facility receiving intensive rehab.  In the context of being discharged on midodrine for his orthostatic hypotension and discontinuation of antianginals and antihypertensives he developed severe hypertension.  Troponin is trending upwards, and ECG is stable.  Now he has significantly improved chest pain with control of his blood pressures with just nitroglycerin.  Given his multiple comorbidities with probable upcoming radical nephrectomy for his renal cell carcinoma favor medical management of his obstructive coronary disease with course of aspirin and heparin and up titration of antianginals.  Antianginals like to be carefully titrated given his issues with orthostatic hypotension though these seem to have resolved with mobilization with rehabilitation and use of compression stockings.  Stop Midrin.  Plan  Elevated troponin Severe hypertension Chest pain -Aspirin 81 mg daily, heparin infusion ACS nomogram x48 hours -Anemia guided reperfusion strategy, favor medical management as able -Resume metoprolol tartrate 12.5 mg twice daily as tolerated -Start Imdur 30 mg daily as tolerated -Tolerates above, low-dose amlodipine -Nitroglycerin as needed for chest pain -Trend troponin to peak -Transthoracic echocardiogram -Increase to Crestor 20 mg daily  Recently diagnosed renal cell carcinoma Complicated urinary tract infection Obstructive uropathy with indwelling urinary catheter -Last dose of fosfomycin course today -Consider urology consult to clarify plan regarding  nephrectomy as this would inform his coronary management  Diabetes mellitus - Sliding scale insulin -Resume basal insulin once diet resumed. -Good candidate for SGLT2 after price check.  Episodic hypotension -Daily orthostatic vitals -Stop Midrin, continue Mestinon.  N.p.o. pending clinical course Full code Telemetry bed  Signed, Delight Hoh, MD 09/08/2019, 9:53 PM  For questions or updates, please contact   Please consult [email or web address] for contact info under Cardiology/STEMI.

## 2019-09-08 NOTE — ED Provider Notes (Addendum)
[facility] EMERGENCY DEPARTMENT Provider Note   CSN: [id number] Arrival date & time: 09/08/19  2107     History Chief Complaint  Patient presents with  . Chest Pain  . Shortness of Breath    Alexis Griffith is a 79 y.o. male.  The history is provided by the patient and medical records.  Chest Pain Pain location:  Substernal area and L chest Pain quality: aching, crushing and pressure   Pain radiates to:  Does not radiate Pain severity:  Moderate Onset quality:  Gradual Duration:  2 hours Timing:  Constant Progression:  Improving Chronicity:  New Context: stress   Relieved by:  Nitroglycerin and aspirin Worsened by:  Nothing Ineffective treatments:  None tried Associated symptoms: anxiety, palpitations and shortness of breath   Associated symptoms: no abdominal pain, no altered mental status, no anorexia, no back pain, no cough, no fatigue, no fever, no headache, no lower extremity edema, no nausea and no vomiting   Risk factors: coronary artery disease and hypertension   Shortness of Breath Associated symptoms: chest pain   Associated symptoms: no abdominal pain, no cough, no fever, no headaches, no vomiting and no wheezing        Past Medical History:  Diagnosis Date  . Anxiety   . CAD (coronary artery disease)   . Carotid artery disease ([place]) 08/24/2011  . Coronary artery disease involving native coronary artery of native heart with angina pectoris ([place]) 07/04/2019  . Diabetes mellitus without complication ([place])   . Excessive urination at night 06/05/2013  . Giant cell arteritis ([place]) 05/17/2012  . Hyperlipidemia   . Hypertension   . PVC (premature ventricular contraction)   . Renal mass   . 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 Burt Knack & Dr. Cyndia Bent  . Severe aortic stenosis   . Sinus bradycardia on ECG   . Sleep apnea   . 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 Wilburn Cornelia.  . Stroke ([place])   . TIA (transient ischemic attack)     Patient Active Problem List   Diagnosis Date Noted  . Acute on chronic diastolic heart failure ([place]) 08/15/2019  . 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 Diabetes mellitus without complication ([place])   . Severe aortic stenosis   . Sleep apnea   . Renal mass   . Coronary artery disease involving native coronary artery of native heart with angina pectoris ([place]) 07/04/2019  . 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 Excessive urination at night 06/05/2013  . TIA (transient ischemic attack) 07/27/2012  . 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 Carotid artery disease ([place]) 08/24/2011  . PVC (premature ventricular contraction)   . Hypertension   . Hyperlipidemia   . Anxiety   . Sinus bradycardia on ECG     Past Surgical History:  Procedure Laterality Date  . CATARACT EXTRACTION, BILATERAL  10/2015, and 11/2015   with adding  TORIC lenses bilaterally   . INTRAVASCULAR PRESSURE WIRE/FFR STUDY N/A 07/07/2019   Procedure: INTRAVASCULAR PRESSURE WIRE/FFR STUDY;  Surgeon: [name], MD;  Location: [place] [facility];  Service: Cardiovascular;  Laterality: N/A;  . LEFT HEART CATH AND CORONARY ANGIOGRAPHY N/A 07/07/2019   Procedure: LEFT HEART CATH AND CORONARY ANGIOGRAPHY;  Surgeon: [name], MD;  Location: [place] [facility];  Service: Cardiovascular;  Laterality: N/A;  .  SQUAMOUS CELL CARCINOMA EXCISION     tongue  . TEE WITHOUT CARDIOVERSION N/A 08/15/2019   Procedure: TRANSESOPHAGEAL ECHOCARDIOGRAM (TEE);  Surgeon: Sherren Mocha, MD;  Location: [place];  Service: Open Heart Surgery;  Laterality: N/A;  . TRANSCATHETER AORTIC VALVE REPLACEMENT, TRANSFEMORAL N/A 08/15/2019   Procedure: TRANSCATHETER AORTIC VALVE REPLACEMENT, TRANSFEMORAL using Edwards [employer] SAPIEN 3 Ultra 26 mm THV.;  Surgeon: Sherren Mocha, MD;  Location: [place];  Service: Open Heart  Surgery;  Laterality: N/A;       Family History  Problem Relation Age of Onset  . Heart disease Mother   . Heart attack Mother   . Heart disease Father   . Heart attack Father     Social History   Tobacco Use  . Smoking status: Former Smoker    Quit date: 06/25/1975    Years since quitting: 44.2  . Smokeless tobacco: Never Used  Vaping Use  . Vaping Use: Never used  Substance Use Topics  . Alcohol use: No  . Drug use: No    Home Medications Prior to Admission medications   Medication Sig Start Date End Date Taking? Authorizing Provider  acetaminophen (TYLENOL) 500 MG tablet Take 1,000 mg by mouth every 6 (six) hours as needed for moderate pain.    [provider]  aspirin 81 MG chewable tablet Chew 1 tablet (81 mg total) by mouth daily. 09/01/19   Eileen Stanford, PA-C  carboxymethylcellulose (REFRESH PLUS) 0.5 % SOLN Place 1 drop into both eyes 2 (two) times daily as needed (dry eyes).    [provider]  Choline Fenofibrate (TRILIPIX) 135 MG capsule Take 135 mg by mouth daily.      [provider]  clonazePAM (KLONOPIN) 0.5 MG tablet Take 0.5 mg by mouth 4 (four) times daily.      [provider]  Coenzyme Q10 200 MG capsule Take 200 mg by mouth daily.    [provider]  fosfomycin (MONUROL) 3 g PACK Take 3 g by mouth every 3 (three) days for 10 days. Mix with 8 oz water and administer immediately. 08/31/19 09/10/19  Angelena Form R, PA-C  glucose blood test strip 1 each by Other route as needed. 05/12/12   [provider]  Lancets (ONETOUCH DELICA PLUS [id number]) Advance  03/23/18   [provider]  LANTUS SOLOSTAR 100 UNIT/ML Solostar Pen Inject 65 Units into the skin daily.  03/19/15   [provider]  meloxicam (MOBIC) 15 MG tablet Take 1 tablet (15 mg total) by mouth daily. 08/31/19   Eileen Stanford, PA-C  metoprolol tartrate (LOPRESSOR) 25 MG tablet Take 0.5 tablets (12.5 mg total) by mouth 2  (two) times daily. 08/31/19   Eileen Stanford, PA-C  midodrine (PROAMATINE) 5 MG tablet Take 1 tablet (5 mg total) by mouth in the morning, at noon, in the evening, and at bedtime. 08/31/19   Eileen Stanford, PA-C  ONE TOUCH ULTRA TEST test strip  05/12/12   [provider]  pyridostigmine (MESTINON) 60 MG tablet Take 0.5 tablets (30 mg total) by mouth 2 (two) times daily. 08/31/19   Eileen Stanford, PA-C  rosuvastatin (CRESTOR) 5 MG tablet Take 1 tablet (5 mg total) by mouth daily. 06/23/19   Nahser, Wonda Cheng, MD  sitaGLIPtin (JANUVIA) 100 MG tablet Take 1 tablet (100 mg total) by mouth daily. 06/26/19   Nahser, Wonda Cheng, MD    Allergies    Macrodantin [nitrofurantoin], Sulfa antibiotics, Lisinopril, and Penicillins  Review of Systems   Review of Systems  Constitutional: Negative for chills, fatigue and fever.  HENT: Negative for congestion.   Eyes: Negative for visual disturbance.  Respiratory: Positive for shortness of breath. Negative for cough, chest tightness and wheezing.   Cardiovascular: Positive for chest pain and palpitations. Negative for leg swelling.  Gastrointestinal: Negative for abdominal pain, anorexia, constipation, diarrhea, nausea and vomiting.  Genitourinary: Negative for flank pain and frequency.  Musculoskeletal: Negative for back pain.  Neurological: Negative for light-headedness and headaches.  Psychiatric/Behavioral: Negative for agitation and confusion.  All other systems reviewed and are negative.   Physical Exam Updated Vital Signs BP (!) 142/68 (BP Location: Left Arm)   Pulse 73   Temp 98.5 F (36.9 C) (Oral)   Resp 18   Ht 5\' 10"  (1.778 m)   Wt 81.6 kg   SpO2 95%   BMI 25.81 kg/m   Physical Exam Vitals and nursing note reviewed.  Constitutional:      General: He is not in acute distress.    Appearance: He is well-developed. He is not ill-appearing, toxic-appearing or diaphoretic.  HENT:     Head: Normocephalic and atraumatic.      Right Ear: External ear normal.     Left Ear: External ear normal.     Nose: Nose normal.     Mouth/Throat:     Pharynx: No oropharyngeal exudate.  Eyes:     Extraocular Movements: Extraocular movements intact.     Conjunctiva/sclera: Conjunctivae normal.     Pupils: Pupils are equal, round, and reactive to light.  Cardiovascular:     Rate and Rhythm: Normal rate.     Heart sounds: Normal heart sounds.  Pulmonary:     Effort: Tachypnea present. No respiratory distress.     Breath sounds: No stridor. Rales present. No wheezing or rhonchi.  Chest:     Chest wall: No tenderness.  Abdominal:     Palpations: Abdomen is soft.     Tenderness: There is no abdominal tenderness. There is no guarding or rebound.  Musculoskeletal:     Cervical back: Normal range of motion and neck supple.     Right lower leg: No tenderness. No edema.     Left lower leg: No tenderness. No edema.  Skin:    General: Skin is warm.     Findings: No erythema or rash.  Neurological:     General: No focal deficit present.     Mental Status: He is alert and oriented to person, place, and time.     Cranial Nerves: No cranial nerve deficit.     Motor: No abnormal muscle tone.     Coordination: Coordination normal.     Deep Tendon Reflexes: Reflexes normal.  Psychiatric:        Mood and Affect: Mood normal.     ED Results / Procedures / Treatments   Labs (all labs ordered are listed, but only abnormal results are displayed) Labs Reviewed  COMPREHENSIVE METABOLIC PANEL - Abnormal; Notable for the following components:      Result Value   Sodium 133 (*)    Potassium 3.4 (*)    Glucose, Bld 218 (*)    Total Protein 6.3 (*)    Albumin 2.9 (*)    All other components within normal limits  BRAIN NATRIURETIC PEPTIDE - Abnormal; Notable for the following components:   B Natriuretic Peptide 222.2 (*)    All other components within normal limits  MAGNESIUM - Abnormal; Notable  for the following components:    Magnesium 1.6 (*)    All other components within normal limits  LACTIC ACID, PLASMA - Abnormal; Notable for the following components:   Lactic Acid, Venous 2.0 (*)    All other components within normal limits  TROPONIN I (HIGH SENSITIVITY) - Abnormal; Notable for the following components:   Troponin I (High Sensitivity) 190 (*)    All other components within normal limits  SARS CORONAVIRUS 2 BY RT PCR (HOSPITAL ORDER, [place] [facility])  CBC WITH DIFFERENTIAL/PLATELET  LACTIC ACID, PLASMA  TROPONIN I (HIGH SENSITIVITY)    EKG EKG Interpretation  Date/Time:  Friday September 08 2019 21:19:09 EDT Ventricular Rate:  102 PR Interval:    QRS Duration: 98 QT Interval:  345 QTC Calculation: 450 R Axis:   0 Text Interpretation: Sinus tachycardia Atrial premature complex Consider left atrial enlargement Abnormal R-wave progression, late transition Probable left ventricular hypertrophy When compared to prior, new PAC. No STEMI Confirmed by Antony Blackbird [phone]) on 09/08/2019 9:34:24 PM   Radiology DG Chest 2 View  Result Date: 09/08/2019 CLINICAL DATA:  Chest pain and shortness of breath. EXAM: CHEST - 2 VIEW COMPARISON:  August 11, 2019 FINDINGS: There is no evidence of acute infiltrate, pleural effusion or pneumothorax. A stable subcentimeter calcified nodular opacity is seen overlying the upper left lung. The heart size and mediastinal contours are within normal limits. An artificial aortic valve is seen. This represents a new finding when compared to the prior study. The visualized skeletal structures are unremarkable. IMPRESSION: No active cardiopulmonary disease. Electronically Signed   By: Virgina Norfolk M.D.   On: 09/08/2019 22:16    Procedures Procedures (including critical care time)  CRITICAL CARE Performed by: Gwenyth Allegra Marlaysia Lenig Total critical care time: 35 minutes Critical care time was exclusive of separately billable procedures and treating other  patients. Critical care was necessary to treat or prevent imminent or life-threatening deterioration. Critical care was time spent personally by me on the following activities: development of treatment plan with patient and/or surrogate as well as nursing, discussions with consultants, evaluation of patient's response to treatment, examination of patient, obtaining history from patient or surrogate, ordering and performing treatments and interventions, ordering and review of laboratory studies, ordering and review of radiographic studies, pulse oximetry and re-evaluation of patient's condition.   Medications Ordered in ED Medications  nitroGLYCERIN (NITROSTAT) SL tablet 0.4 mg (has no administration in time range)    ED Course  I have reviewed the triage vital signs and the nursing notes.  Pertinent labs & imaging results that were available during my care of the patient were reviewed by me and considered in my medical decision making (see chart for details).    MDM Rules/Calculators/A&P                          Alexis Griffith is a 79 y.o. male with a past CAD, hypertension, hyperlipidemia, prior TIA, diabetes, and TAVR aortic valve replacement less than 1 month ago on 08/15/2019 who presents with chest pain, shortness of breath, and elevated blood pressures.  According to patient, he was recently given news that he likely has a renal mass that needs resection as well as possible metastasis into other places.  He reports that he has been having increasing stress this evening with this information and started having chest discomfort and shortness of breath.  He describes the pain as moderate to severe  in his left central chest that was a pressure-like pain.  He does not radiate.  He denied nausea, vomiting, or diaphoresis.  He does report shortness of breath with it.  EMS reported his blood pressure was over 831 systolic.  He was given nitroglycerin and aspirin and had improvement of his discomfort  to 4 out of 10 in severity.  Blood pressure also improved in the 170s prior to arrival.  Patient reports that he has not had this type of pain in the past.  He reports no trauma.  He denies any urinary changes but he does have a catheter in place as he has had recurrent retention in the setting of recurrent UTIs.  He reports no new leg pain or leg swelling.  No history of DVT or PE.  On exam, lungs are clear with no wheezing or rhonchi or rales.  Chest was nontender.  No murmur was appreciated on my exam.  Abdomen was nontender.  Good pulses in extremities.  No significant lower extremity tenderness or edema appreciated.  EKG showed no STEMI.  Clinical aspect his stress, anxiety, and blood pressures being very elevated contributed to his chest discomfort and shortness of breath.  Given his recent aortic valve replacement, I called cardiology to come see the patient.  We will get screening labs, imaging, and will reassess the patient.  He was given another sublingual nitroglycerin to see if this helps his discomfort and helps with his blood pressure.  Anticipate reassessment after work-up results and management.  Troponin returned at 190.  Otherwise, magnesium slightly low, no leukocytosis or anemia.  Kidney function normal.  BNP elevated at 222.  Chest x-ray shows no acute cardiopulmonary disease.  Given the elevated troponin, will await to see what cardiology recommends.  Care transferred to Dr. Wyvonnia Dusky while waiting for cardiology evaluation.   Final Clinical Impression(s) / ED Diagnoses Final diagnoses:  Chest pain, unspecified type  Shortness of breath  Elevated troponin    Clinical Impression: 1. Chest pain, unspecified type   2. Shortness of breath   3. Elevated troponin     Disposition: Care transferred to Dr. Wyvonnia Dusky while waiting for cardiology evaluation.  This note was prepared with assistance of Systems analyst. Occasional wrong-word or sound-a-like  substitutions may have occurred due to the inherent limitations of voice recognition software.      Acsa Estey, Gwenyth Allegra, MD 09/09/19 0007    Kerman Pfost, Gwenyth Allegra, MD 09/27/19 1036

## 2019-09-08 NOTE — ED Notes (Signed)
Cardiology at bedside.

## 2019-09-08 NOTE — ED Notes (Signed)
EDP at bedside  

## 2019-09-08 NOTE — ED Notes (Signed)
Transported to XR  

## 2019-09-09 ENCOUNTER — Inpatient Hospital Stay (HOSPITAL_COMMUNITY): Payer: Medicare Other

## 2019-09-09 ENCOUNTER — Other Ambulatory Visit: Payer: Self-pay | Admitting: Cardiology

## 2019-09-09 ENCOUNTER — Encounter (HOSPITAL_COMMUNITY): Payer: Self-pay | Admitting: Student

## 2019-09-09 DIAGNOSIS — E785 Hyperlipidemia, unspecified: Secondary | ICD-10-CM | POA: Diagnosis present

## 2019-09-09 DIAGNOSIS — Z952 Presence of prosthetic heart valve: Secondary | ICD-10-CM

## 2019-09-09 DIAGNOSIS — C649 Malignant neoplasm of unspecified kidney, except renal pelvis: Secondary | ICD-10-CM | POA: Diagnosis present

## 2019-09-09 DIAGNOSIS — R0602 Shortness of breath: Secondary | ICD-10-CM | POA: Diagnosis present

## 2019-09-09 DIAGNOSIS — R778 Other specified abnormalities of plasma proteins: Secondary | ICD-10-CM | POA: Diagnosis not present

## 2019-09-09 DIAGNOSIS — Z20822 Contact with and (suspected) exposure to covid-19: Secondary | ICD-10-CM | POA: Diagnosis present

## 2019-09-09 DIAGNOSIS — I491 Atrial premature depolarization: Secondary | ICD-10-CM | POA: Diagnosis present

## 2019-09-09 DIAGNOSIS — Z882 Allergy status to sulfonamides status: Secondary | ICD-10-CM | POA: Diagnosis not present

## 2019-09-09 DIAGNOSIS — R079 Chest pain, unspecified: Secondary | ICD-10-CM | POA: Insufficient documentation

## 2019-09-09 DIAGNOSIS — Z8673 Personal history of transient ischemic attack (TIA), and cerebral infarction without residual deficits: Secondary | ICD-10-CM | POA: Diagnosis not present

## 2019-09-09 DIAGNOSIS — I25119 Atherosclerotic heart disease of native coronary artery with unspecified angina pectoris: Secondary | ICD-10-CM | POA: Diagnosis present

## 2019-09-09 DIAGNOSIS — I110 Hypertensive heart disease with heart failure: Secondary | ICD-10-CM | POA: Diagnosis present

## 2019-09-09 DIAGNOSIS — I350 Nonrheumatic aortic (valve) stenosis: Secondary | ICD-10-CM | POA: Diagnosis present

## 2019-09-09 DIAGNOSIS — Z888 Allergy status to other drugs, medicaments and biological substances status: Secondary | ICD-10-CM | POA: Diagnosis not present

## 2019-09-09 DIAGNOSIS — Z8249 Family history of ischemic heart disease and other diseases of the circulatory system: Secondary | ICD-10-CM | POA: Diagnosis not present

## 2019-09-09 DIAGNOSIS — Z85810 Personal history of malignant neoplasm of tongue: Secondary | ICD-10-CM | POA: Diagnosis not present

## 2019-09-09 DIAGNOSIS — N138 Other obstructive and reflux uropathy: Secondary | ICD-10-CM | POA: Diagnosis present

## 2019-09-09 DIAGNOSIS — Z7982 Long term (current) use of aspirin: Secondary | ICD-10-CM | POA: Diagnosis not present

## 2019-09-09 DIAGNOSIS — I214 Non-ST elevation (NSTEMI) myocardial infarction: Secondary | ICD-10-CM | POA: Diagnosis present

## 2019-09-09 DIAGNOSIS — Z880 Allergy status to penicillin: Secondary | ICD-10-CM | POA: Diagnosis not present

## 2019-09-09 DIAGNOSIS — I10 Essential (primary) hypertension: Secondary | ICD-10-CM

## 2019-09-09 DIAGNOSIS — D649 Anemia, unspecified: Secondary | ICD-10-CM | POA: Diagnosis present

## 2019-09-09 DIAGNOSIS — E119 Type 2 diabetes mellitus without complications: Secondary | ICD-10-CM | POA: Diagnosis present

## 2019-09-09 DIAGNOSIS — Z87891 Personal history of nicotine dependence: Secondary | ICD-10-CM | POA: Diagnosis not present

## 2019-09-09 DIAGNOSIS — R072 Precordial pain: Secondary | ICD-10-CM | POA: Diagnosis not present

## 2019-09-09 DIAGNOSIS — I2510 Atherosclerotic heart disease of native coronary artery without angina pectoris: Secondary | ICD-10-CM

## 2019-09-09 DIAGNOSIS — I160 Hypertensive urgency: Secondary | ICD-10-CM | POA: Insufficient documentation

## 2019-09-09 DIAGNOSIS — I5032 Chronic diastolic (congestive) heart failure: Secondary | ICD-10-CM | POA: Diagnosis present

## 2019-09-09 DIAGNOSIS — I951 Orthostatic hypotension: Secondary | ICD-10-CM | POA: Diagnosis present

## 2019-09-09 DIAGNOSIS — N401 Enlarged prostate with lower urinary tract symptoms: Secondary | ICD-10-CM | POA: Diagnosis present

## 2019-09-09 DIAGNOSIS — I6523 Occlusion and stenosis of bilateral carotid arteries: Secondary | ICD-10-CM

## 2019-09-09 HISTORY — DX: Non-ST elevation (NSTEMI) myocardial infarction: I21.4

## 2019-09-09 LAB — BRAIN NATRIURETIC PEPTIDE
B Natriuretic Peptide: 222.2 pg/mL — ABNORMAL HIGH (ref 0.0–100.0)
B Natriuretic Peptide: 326.2 pg/mL — ABNORMAL HIGH (ref 0.0–100.0)

## 2019-09-09 LAB — TROPONIN I (HIGH SENSITIVITY)
Troponin I (High Sensitivity): 1311 ng/L (ref <18)
Troponin I (High Sensitivity): 2757 ng/L (ref <18)
Troponin I (High Sensitivity): 3124 ng/L (ref <18)
Troponin I (High Sensitivity): 1189 ng/L (ref <18)
Troponin I (High Sensitivity): 911 ng/L (ref <18)

## 2019-09-09 LAB — BASIC METABOLIC PANEL
Anion gap: 12 (ref 5–15)
BUN: 11 mg/dL (ref 8–23)
CO2: 22 mmol/L (ref 22–32)
Calcium: 9.4 mg/dL (ref 8.9–10.3)
Chloride: 103 mmol/L (ref 98–111)
Creatinine, Ser: 0.73 mg/dL (ref 0.61–1.24)
GFR calc Af Amer: >60 mL/min (ref >60)
GFR calc non Af Amer: >60 mL/min (ref >60)
Glucose, Bld: 141 mg/dL — ABNORMAL HIGH (ref 70–99)
Potassium: 3.8 mmol/L (ref 3.5–5.1)
Sodium: 137 mmol/L (ref 135–145)

## 2019-09-09 LAB — CBG MONITORING, ED
Glucose-Capillary: 123 mg/dL — ABNORMAL HIGH (ref 70–99)
Glucose-Capillary: 125 mg/dL — ABNORMAL HIGH (ref 70–99)
Glucose-Capillary: 128 mg/dL — ABNORMAL HIGH (ref 70–99)
Glucose-Capillary: 152 mg/dL — ABNORMAL HIGH (ref 70–99)

## 2019-09-09 LAB — GLUCOSE, CAPILLARY
Glucose-Capillary: 164 mg/dL — ABNORMAL HIGH (ref 70–99)
Glucose-Capillary: 150 mg/dL — ABNORMAL HIGH (ref 70–99)

## 2019-09-09 LAB — CBC
HCT: 38.8 % — ABNORMAL LOW (ref 39.0–52.0)
Hemoglobin: 12.9 g/dL — ABNORMAL LOW (ref 13.0–17.0)
MCH: 27.1 pg (ref 26.0–34.0)
MCHC: 33.2 g/dL (ref 30.0–36.0)
MCV: 81.5 fL (ref 80.0–100.0)
Platelets: 224 10*3/uL (ref 150–400)
RBC: 4.76 MIL/uL (ref 4.22–5.81)
RDW: 13.1 % (ref 11.5–15.5)
WBC: 7.7 10*3/uL (ref 4.0–10.5)
nRBC: 0 % (ref 0.0–0.2)

## 2019-09-09 LAB — LIPID PANEL
Cholesterol: 130 mg/dL (ref 0–200)
HDL: 40 mg/dL — ABNORMAL LOW (ref >40)
LDL Cholesterol: 76 mg/dL (ref 0–99)
Total CHOL/HDL Ratio: 3.3 RATIO
Triglycerides: 68 mg/dL (ref <150)
VLDL: 14 mg/dL (ref 0–40)

## 2019-09-09 LAB — LACTIC ACID, PLASMA: Lactic Acid, Venous: 1.8 mmol/L (ref 0.5–1.9)

## 2019-09-09 LAB — PROTIME-INR
INR: 1.2 (ref 0.8–1.2)
Prothrombin Time: 14.9 seconds (ref 11.4–15.2)

## 2019-09-09 LAB — HEPARIN LEVEL (UNFRACTIONATED)
Heparin Unfractionated: 0.26 IU/mL — ABNORMAL LOW (ref 0.30–0.70)
Heparin Unfractionated: 0.13 IU/mL — ABNORMAL LOW (ref 0.30–0.70)

## 2019-09-09 LAB — SARS CORONAVIRUS 2 BY RT PCR (HOSPITAL ORDER, PERFORMED IN ~~LOC~~ HOSPITAL LAB): SARS Coronavirus 2: NEGATIVE

## 2019-09-09 MED ORDER — ROSUVASTATIN CALCIUM 20 MG PO TABS
20.0000 mg | ORAL_TABLET | Freq: Every day | ORAL | Status: DC
  Administered 2019-09-09 – 2019-09-12 (×4): 20 mg via ORAL
  Filled 2019-09-09 (×5): qty 1

## 2019-09-09 MED ORDER — ASPIRIN EC 81 MG PO TBEC
81.0000 mg | DELAYED_RELEASE_TABLET | Freq: Every day | ORAL | Status: DC
  Administered 2019-09-10 – 2019-09-13 (×4): 81 mg via ORAL
  Filled 2019-09-09 (×4): qty 1

## 2019-09-09 MED ORDER — HEPARIN BOLUS VIA INFUSION
4000.0000 [IU] | Freq: Once | INTRAVENOUS | Status: AC
  Administered 2019-09-09: 4000 [IU] via INTRAVENOUS
  Filled 2019-09-09: qty 4000

## 2019-09-09 MED ORDER — INSULIN ASPART 100 UNIT/ML ~~LOC~~ SOLN: 0.0000 [IU] | Freq: Every day | SUBCUTANEOUS | Status: DC

## 2019-09-09 MED ORDER — CLONAZEPAM 0.5 MG PO TABS
0.5000 mg | ORAL_TABLET | Freq: Four times a day (QID) | ORAL | Status: DC
  Administered 2019-09-09 – 2019-09-13 (×16): 0.5 mg via ORAL
  Filled 2019-09-09 (×16): qty 1

## 2019-09-09 MED ORDER — ASPIRIN 81 MG PO CHEW: 81.0000 mg | CHEWABLE_TABLET | Freq: Every day | ORAL | Status: DC

## 2019-09-09 MED ORDER — NITROGLYCERIN 0.4 MG SL SUBL: 0.4000 mg | SUBLINGUAL_TABLET | SUBLINGUAL | Status: DC | PRN

## 2019-09-09 MED ORDER — ISOSORBIDE MONONITRATE ER 30 MG PO TB24: 30.0000 mg | ORAL_TABLET | Freq: Every day | ORAL | Status: DC

## 2019-09-09 MED ORDER — AMLODIPINE BESYLATE 2.5 MG PO TABS
2.5000 mg | ORAL_TABLET | Freq: Every day | ORAL | Status: DC
  Administered 2019-09-09: 2.5 mg via ORAL
  Filled 2019-09-09: qty 1

## 2019-09-09 MED ORDER — ISOSORBIDE MONONITRATE ER 60 MG PO TB24
60.0000 mg | ORAL_TABLET | Freq: Every day | ORAL | Status: DC
  Administered 2019-09-10 – 2019-09-13 (×4): 60 mg via ORAL
  Filled 2019-09-09 (×4): qty 1

## 2019-09-09 MED ORDER — METOPROLOL TARTRATE 25 MG PO TABS
25.0000 mg | ORAL_TABLET | Freq: Two times a day (BID) | ORAL | Status: DC
  Administered 2019-09-09 – 2019-09-13 (×8): 25 mg via ORAL
  Filled 2019-09-09 (×8): qty 1

## 2019-09-09 MED ORDER — HYDRALAZINE HCL 25 MG PO TABS
25.0000 mg | ORAL_TABLET | Freq: Four times a day (QID) | ORAL | Status: DC | PRN
  Administered 2019-09-09: 25 mg via ORAL
  Filled 2019-09-09: qty 1

## 2019-09-09 MED ORDER — ROSUVASTATIN CALCIUM 20 MG PO TABS: 20.0000 mg | ORAL_TABLET | Freq: Every day | ORAL | Status: DC

## 2019-09-09 MED ORDER — INSULIN ASPART 100 UNIT/ML ~~LOC~~ SOLN
0.0000 [IU] | Freq: Three times a day (TID) | SUBCUTANEOUS | Status: DC
  Administered 2019-09-09: 2 [IU] via SUBCUTANEOUS
  Administered 2019-09-09: 3 [IU] via SUBCUTANEOUS
  Administered 2019-09-09: 2 [IU] via SUBCUTANEOUS
  Administered 2019-09-10 – 2019-09-11 (×5): 3 [IU] via SUBCUTANEOUS

## 2019-09-09 MED ORDER — PYRIDOSTIGMINE BROMIDE 60 MG PO TABS
30.0000 mg | ORAL_TABLET | Freq: Two times a day (BID) | ORAL | Status: DC
  Administered 2019-09-09 – 2019-09-12 (×6): 30 mg via ORAL
  Filled 2019-09-09 (×7): qty 0.5

## 2019-09-09 MED ORDER — ONDANSETRON HCL 4 MG/2ML IJ SOLN: 4.0000 mg | Freq: Four times a day (QID) | INTRAMUSCULAR | Status: DC | PRN

## 2019-09-09 MED ORDER — METOPROLOL TARTRATE 25 MG PO TABS: 12.5000 mg | ORAL_TABLET | Freq: Two times a day (BID) | ORAL | Status: DC

## 2019-09-09 MED ORDER — FOSFOMYCIN TROMETHAMINE 3 G PO PACK
3.0000 g | PACK | Freq: Once | ORAL | Status: AC
  Administered 2019-09-10: 3 g via ORAL
  Filled 2019-09-09: qty 3

## 2019-09-09 MED ORDER — HEPARIN (PORCINE) 25000 UT/250ML-% IV SOLN
1550.0000 [IU]/h | INTRAVENOUS | Status: DC
  Administered 2019-09-09: 1000 [IU]/h via INTRAVENOUS
  Administered 2019-09-10: 1550 [IU]/h via INTRAVENOUS
  Filled 2019-09-09 (×3): qty 250

## 2019-09-09 MED ORDER — CHLORHEXIDINE GLUCONATE CLOTH 2 % EX PADS
6.0000 | MEDICATED_PAD | Freq: Every day | CUTANEOUS | Status: DC
  Administered 2019-09-09 – 2019-09-12 (×4): 6 via TOPICAL

## 2019-09-09 MED ORDER — ACETAMINOPHEN 325 MG PO TABS
650.0000 mg | ORAL_TABLET | ORAL | Status: DC | PRN
  Administered 2019-09-13: 650 mg via ORAL
  Filled 2019-09-09: qty 2

## 2019-09-09 NOTE — Progress Notes (Signed)
ANTICOAGULATION CONSULT NOTE - Follow Up Consult  Pharmacy Consult for heparin Indication: chest pain/ACS  Allergies  Allergen Reactions  . Macrodantin [Nitrofurantoin] Other (See Comments)    "blocked kidneys"   . Sulfa Antibiotics Rash  . Lisinopril Cough  . Penicillins     08/30/19- Patient can't remember, was 35+ years ago. OK with trying cephalexin in hospital    Patient Measurements: Height: 5\' 10"  (177.8 cm) Weight: 87.7 kg (193 lb 5.5 oz) IBW/kg (Calculated) : 73  Vital Signs: Temp: 97.8 F (36.6 C) (07/10 2021) Temp Source: Oral (07/10 2021) BP: 148/79 (07/10 1612) Pulse Rate: 93 (07/10 1218)  Labs: Recent Labs    09/08/19 2227 09/08/19 2349 09/09/19 0308 09/09/19 0308 09/09/19 0541 09/09/19 0920 09/09/19 1446 09/09/19 1659 09/09/19 2126  HGB 13.1  --  12.9*  --   --   --   --   --   --   HCT 39.9  --  38.8*  --   --   --   --   --   --   PLT 232  --  224  --   --   --   --   --   --   LABPROT  --   --  14.9  --   --   --   --   --   --   INR  --   --  1.2  --   --   --   --   --   --   HEPARINUNFRC  --   --   --   --   --  0.26*  --   --  0.13*  CREATININE 0.79  --  0.73  --   --   --   --   --   --   TROPONINIHS 190*   < > 2,757*   < > 3,124*  --  1,189* 911*  --    < > = values in this interval not displayed.    Estimated Creatinine Clearance: 84.9 mL/min (by C-G formula based on SCr of 0.73 mg/dL).   Medical History: Past Medical History:  Diagnosis Date  . Anxiety   . CAD (coronary artery disease)   . Carotid artery disease ([place]) 08/24/2011  . Coronary artery disease involving native coronary artery of native heart with angina pectoris ([place]) 07/04/2019  . Diabetes mellitus without complication ([place])   . Excessive urination at night 06/05/2013  . 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 Hyperlipidemia   . Hypertension   . PVC (premature ventricular contraction)   . Renal mass   . 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 Burt Knack & Dr. Cyndia Bent  . Severe aortic stenosis   . Sinus bradycardia on ECG   . Sleep apnea   . 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 Wilburn Cornelia.  . Stroke ([place])   . TIA (transient ischemic attack)     Assessment: 79yo male who had TAVR last month c/o CP associated w/ SOB at rest, initial troponin elevated and increasing (190 > 1311), on heparin.  Heparin level is still subtherapeutic at 0.13 IU/mL after rate adjustment earlier today. Hgb and HCT stable. PLT WNL. No IV or bleeding issues noted.    Goal of Therapy:  Heparin level 0.3-0.7 units/ml Monitor platelets by anticoagulation protocol: Yes   Plan:  Per 7/9 cardiology  note, plan is for medical management and to continue heparin x48 hours.   Will increase heparin to 1350 units per hour. Repeat heparin level with am labs  Erin Hearing PharmD., BCPS Clinical Pharmacist 09/09/2019 10:44 PM

## 2019-09-09 NOTE — Progress Notes (Addendum)
Progress Note  Patient Name: Alexis Griffith Date of Encounter: 09/09/2019  [facility] Cardiologist: Mertie Moores, MD   Subjective   The patient continues to have mild chest discomfort, improved since admission.  Inpatient Medications    Scheduled Meds: . amLODipine  2.5 mg Oral Daily  . aspirin  81 mg Oral Daily  . [START ON 09/10/2019] aspirin EC  81 mg Oral Daily  . clonazePAM  0.5 mg Oral QID  . [START ON 09/10/2019] fosfomycin  3 g Oral Once  . insulin aspart  0-15 Units Subcutaneous TID WC  . insulin aspart  0-5 Units Subcutaneous QHS  . isosorbide mononitrate  30 mg Oral Daily  . metoprolol tartrate  12.5 mg Oral BID  . pyridostigmine  30 mg Oral BID  . rosuvastatin  20 mg Oral Daily   Continuous Infusions: . heparin 1,000 Units/hr (09/09/19 0244)   PRN Meds: acetaminophen, hydrALAZINE, nitroGLYCERIN, nitroGLYCERIN, ondansetron (ZOFRAN) IV   Vital Signs    Vitals:   09/09/19 0615 09/09/19 0630 09/09/19 0645 09/09/19 0827  BP: 129/61 (!) 160/55 (!) 130/50 (!) 164/67  Pulse: 61 60 63 80  Resp: 15 14 16 14   Temp:    97.9 F (36.6 C)  TempSrc:    Oral  SpO2: 95% 98% 96% 96%  Weight:      Height:        Intake/Output Summary (Last 24 hours) at 09/09/2019 1218 Last data filed at 09/09/2019 1216 Gross per 24 hour  Intake --  Output 2275 ml  Net -2275 ml   Last 3 Weights 09/08/2019 08/31/2019 08/30/2019  Weight (lbs) 193 lb 5.5 oz 193 lb 5.5 oz 190 lb 11.2 oz  Weight (kg) 87.7 kg 87.7 kg 86.5 kg      Telemetry    Sinus rhythm, PACs- Personally Reviewed  ECG    SR, PACs, nonspecific ST-T wave abnormalities not previously negative T waves in the lateral leads on EKGs on June 15 and July 2- Personally Reviewed  Physical Exam   GEN: No acute distress.   Neck: No JVD Cardiac: RRR, no murmurs, rubs, or gallops.  Respiratory: Clear to auscultation bilaterally. GI: Soft, nontender, non-distended  MS: No edema; No deformity. Neuro:  Nonfocal  Psych:  Normal affect   Labs    High Sensitivity Troponin:   Recent Labs  Lab 09/08/19 2227 09/08/19 2349 09/09/19 0308 09/09/19 0541  TROPONINIHS 190* 1,311* 2,757* 3,124*      Chemistry Recent Labs  Lab 09/08/19 2227 09/09/19 0308  NA 133* 137  K 3.4* 3.8  CL 101 103  CO2 23 22  GLUCOSE 218* 141*  BUN 12 11  CREATININE 0.79 0.73  CALCIUM 9.4 9.4  PROT 6.3*  --   ALBUMIN 2.9*  --   AST 22  --   ALT 20  --   ALKPHOS 50  --   BILITOT 0.4  --   GFRNONAA >60 >60  GFRAA >60 >60  ANIONGAP 9 12     Hematology Recent Labs  Lab 09/08/19 2227 09/09/19 0308  WBC 8.8 7.7  RBC 4.89 4.76  HGB 13.1 12.9*  HCT 39.9 38.8*  MCV 81.6 81.5  MCH 26.8 27.1  MCHC 32.8 33.2  RDW 13.0 13.1  PLT 232 224    BNP Recent Labs  Lab 09/08/19 2139 09/09/19 0308  BNP 222.2* 326.2*     DDimer No results for input(s): DDIMER in the last 168 hours.   Radiology    DG Chest  2 View  Result Date: 09/08/2019 CLINICAL DATA:  Chest pain and shortness of breath. EXAM: CHEST - 2 VIEW COMPARISON:  August 11, 2019 FINDINGS: There is no evidence of acute infiltrate, pleural effusion or pneumothorax. A stable subcentimeter calcified nodular opacity is seen overlying the upper left lung. The heart size and mediastinal contours are within normal limits. An artificial aortic valve is seen. This represents a new finding when compared to the prior study. The visualized skeletal structures are unremarkable. IMPRESSION: No active cardiopulmonary disease. Electronically Signed   By: Virgina Norfolk M.D.   On: 09/08/2019 22:16   Cardiac Studies   TTE: 08/16/2019  1. Left ventricular ejection fraction, by estimation, is 55 to 60%. The  left ventricle has normal function. The left ventricle demonstrates  regional wall motion abnormalities (see scoring diagram/findings for  description). There is mild concentric left  ventricular hypertrophy. Indeterminate diastolic filling due to E-A  fusion.  2. Right  ventricular systolic function is normal. The right ventricular  size is normal.  3. The aortic valve is normal in structure. Aortic valve regurgitation is  not visualized. No aortic stenosis is present. Aortic valve mean gradient  measures 11.7 mmHg. Aortic valve Vmax measures 2.48 m/s.   Cath: 07/07/2019  Conclusion   Ost LM lesion is 30% stenosed.  Prox LAD to Mid LAD lesion is 70% stenosed with 70% stenosed side branch in 2nd Diag. Both vessels positive by CT FFR  Mid LAD to Dist LAD lesion is 40% stenosed. Dist LAD lesion is 75% stenosed.  Prox Cx lesion is 45% stenosed. Prox Cx to Mid Cx lesion is 75% stenosed. RFR positive is 0.87  Mid Cx lesion is 5% stenosed with 50% stenosed side branch in 3rd Mrg. No significant drop in RFR reading beyond this lesion.  Ost RCA to Dist RCA lesion is 100% stenosed. Significant left to right collaterals fill the PDA and PL system.  RPDA lesion is 50% stenosed.  --------------------------------------  The left ventricular systolic function is normal. The left ventricular ejection fraction is 55-65% by visual estimate. Regional wall motion knowledged and inferior wall.  LV end diastolic pressure is moderately elevated.  There is ~SEVERE AORTIC VALVE STENOSIS.    SUMMARY  Severe three-vessel disease: 100% CTO of ostial RCA (left to right collaterals fill PDA and PL), long 70% calcified proximal to mid LAD (positive by CT FFR), segmental ~75% proximal-mid LCx (positive by RFR)  Borderline SEVERE AORTIC STENOSIS-mean gradient estimated 37 mmHg by pullback.  Normal LVEF with basal inferior hypokinesis seen on echocardiogram and LV gram.   Patient Profile     79 y.o. 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  He presented yesterday with severe chest pain at rest with typical features. He was recently diagnosed with renal cell carcinoma. Reports that he is anxious for multiple reasons including the prospect of a nephrectomy for his renal cell carcinoma and difficulties with renewal of his insurance approval for rehabilitation.  In the context of this he developed severe range hypertension with systolics in the 782N on presentation.  He received nitroglycerin with prompt improvement in his blood pressure in the 160s.  BP control in the ER improved chest pain  His last admission was complicated post procedurally primarily by urinary tract infection,  failure to wean from a urinary catheter, and orthostatic hypotension for which he was discharged on Midodrin.  Given that he has been getting more hypotensive at his rehab facility, midodrine was stopped 2 days ago.  He is also stopped all antihypertensives and antianginals prior to last discharge.     Assessment & Plan    1. Elevated troponin 190->1311->2750->3124 In the settings of known obstructive multivessel disease and severe hypertension, troponin elevation slightly higher than what we would expect with demand ischemia only -I would focus on good blood pressure control, if he continues to have symptoms we will discuss with interventional team about potential intervention to LAD.  Prior to TAVR he was consider for surgical CABG and AVR however was considered too high risk for possible complication after surgery and because he did not have typical angina medical management was determined with TAVR -Underwent coronary CTA in April 2021 that showed at least moderate lesion in proximal LAD and occluded ostial RCA, CT FFR was positive for proximal LAD with decrease of FFR from 0.90->0.76, cardiac cath was reviewed with Dr. Saunders Revel today and we both agreed that on cardiac catheterization proximal LAD lesion looks just borderline.  If patient  continues to have chest pain, he will need to have these lesion revised with invasive FFR. -Aspirin 81 mg daily, heparin infusion ACS x 48 hours -Increase metoprolol tartrate to 25 mg twice daily as tolerated -Started on Imdur 30 mg daily, I will increase to 60 mg daily -Continue low-dose amlodipine -Trend troponin to peak -Transthoracic echocardiogram, limited for potential new wall motion abnormalities -Increase to Crestor 20 mg daily  Recently diagnosed renal cell carcinoma Complicated urinary tract infection Obstructive uropathy with indwelling urinary catheter -Last dose of fosfomycin course today -Consider urology consult to clarify plan regarding nephrectomy as this would inform his coronary management  Diabetes mellitus - Sliding scale insulin -Resume basal insulin once diet resumed. -Good candidate for SGLT2 after price check.  Episodic hypotension -Daily orthostatic vitals -Stop Midrin, continue Mestinon.  For questions or updates, please contact [place] Please consult [email or web address] for contact info under     Signed, Ena Dawley, MD  09/09/2019, 12:18 PM

## 2019-09-09 NOTE — Progress Notes (Signed)
ANTICOAGULATION CONSULT NOTE - Initial Consult  Pharmacy Consult for heparin Indication: chest pain/ACS  Allergies  Allergen Reactions  . Macrodantin [Nitrofurantoin] Other (See Comments)    "blocked kidneys"   . Sulfa Antibiotics Rash  . Lisinopril Cough  . Penicillins     08/30/19- Patient can't remember, was 35+ years ago. OK with trying cephalexin in hospital    Patient Measurements: Height: 5\' 10"  (177.8 cm) Weight: 87.7 kg (193 lb 5.5 oz) IBW/kg (Calculated) : 73  Vital Signs: Temp: 98 F (36.7 C) (07/09 2233) Temp Source: Oral (07/09 2233) BP: 169/67 (07/10 0130) Pulse Rate: 78 (07/10 0130)  Labs: Recent Labs    09/08/19 2227 09/08/19 2349  HGB 13.1  --   HCT 39.9  --   PLT 232  --   CREATININE 0.79  --   TROPONINIHS 190* 1,311*    Estimated Creatinine Clearance: 84.9 mL/min (by C-G formula based on SCr of 0.79 mg/dL).   Medical History: Past Medical History:  Diagnosis Date  . Anxiety   . CAD (coronary artery disease)   . Carotid artery disease ([place]) 08/24/2011  . Coronary artery disease involving native coronary artery of native heart with angina pectoris ([place]) 07/04/2019  . Diabetes mellitus without complication ([place])   . Excessive urination at night 06/05/2013  . Giant cell arteritis ([place]) 05/17/2012  . Hyperlipidemia   . Hypertension   . PVC (premature ventricular contraction)   . Renal mass   . 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 Burt Knack & Dr. Cyndia Bent  . Severe aortic stenosis   . Sinus bradycardia on ECG   . Sleep apnea   . 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 Wilburn Cornelia.  . Stroke ([place])   . TIA (transient ischemic attack)     Assessment: 79yo male who had TAVR last month c/o CP associated w/ SOB at rest, initial troponin elevated and increasing (190 > 1311), to begin heparin.  Goal of Therapy:  Heparin level 0.3-0.7  units/ml Monitor platelets by anticoagulation protocol: Yes   Plan:  Will give heparin 4000 units IV bolus x1 followed by gtt at 1000 units/hr and monitor heparin levels and CBC.  Wynona Neat, PharmD, BCPS  09/09/2019,2:23 AM

## 2019-09-09 NOTE — ED Notes (Signed)
Pt states he had not eaten much prior to arrival concerned about his blood sugar. Request RN to check. CBG 152

## 2019-09-09 NOTE — ED Notes (Signed)
PAGED DR NIPP TO RN BILLY--Alexis Griffith

## 2019-09-09 NOTE — ED Notes (Signed)
Date and time results received: 09/09/19 134 (use smartphrase ".now" to insert current time)  Test: Trop Critical Value: 1311  Name of Provider Notified: Rancour

## 2019-09-09 NOTE — Progress Notes (Signed)
Upon admission assessment, large chunks of brown noted stuck to foley catheter from insertion site down to clear tubing. Penile/scrotal area with severe redness/MASD and chunky white buildup in folds of penis and scrotum. It appeared to not have been cleaned for quite some time.   Patient stated "They do not clean me down there at the nursing home" when we asked if his catheter had been cleaned by staff 2x daily/at all. It took 15 minutes of soaking in foam soap/scrubbing with washcloth from insertion site outward and a CHG bath by RN/CNA Nikita to clean the foley catheter and surrounding perineal area.

## 2019-09-09 NOTE — ED Notes (Signed)
RN spoke to cardiology r/t POC. Pending second trop result to determine care. EDP Rancour updated. Pt also updated

## 2019-09-09 NOTE — ED Notes (Signed)
No new orders placed. Paged Cardiology requesting POC

## 2019-09-09 NOTE — Progress Notes (Addendum)
ANTICOAGULATION CONSULT NOTE - Follow Up Consult  Pharmacy Consult for heparin Indication: chest pain/ACS  Allergies  Allergen Reactions  . Macrodantin [Nitrofurantoin] Other (See Comments)    "blocked kidneys"   . Sulfa Antibiotics Rash  . Lisinopril Cough  . Penicillins     08/30/19- Patient can't remember, was 35+ years ago. OK with trying cephalexin in hospital    Patient Measurements: Height: 5\' 10"  (177.8 cm) Weight: 87.7 kg (193 lb 5.5 oz) IBW/kg (Calculated) : 73  Vital Signs: Temp: 97.9 F (36.6 C) (07/10 0827) Temp Source: Oral (07/10 0827) BP: 164/67 (07/10 0827) Pulse Rate: 80 (07/10 0827)  Labs: Recent Labs    09/08/19 2227 09/08/19 2227 09/08/19 2349 09/09/19 0308 09/09/19 0541 09/09/19 0920  HGB 13.1  --   --  12.9*  --   --   HCT 39.9  --   --  38.8*  --   --   PLT 232  --   --  224  --   --   LABPROT  --   --   --  14.9  --   --   INR  --   --   --  1.2  --   --   HEPARINUNFRC  --   --   --   --   --  0.26*  CREATININE 0.79  --   --  0.73  --   --   TROPONINIHS 190*   < > 1,311* 2,757* 3,124*  --    < > = values in this interval not displayed.    Estimated Creatinine Clearance: 84.9 mL/min (by C-G formula based on SCr of 0.73 mg/dL).   Medical History: Past Medical History:  Diagnosis Date  . Anxiety   . CAD (coronary artery disease)   . Carotid artery disease ([place]) 08/24/2011  . Coronary artery disease involving native coronary artery of native heart with angina pectoris ([place]) 07/04/2019  . Diabetes mellitus without complication ([place])   . Excessive urination at night 06/05/2013  . 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 Hyperlipidemia   . Hypertension   . PVC (premature ventricular contraction)   . Renal mass   . 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 Burt Knack & Dr. Cyndia Bent  . Severe aortic stenosis   . Sinus bradycardia on ECG   . Sleep apnea   . Squamous cell  carcinoma of tongue [facility]) September 2012   Followed by Dr. Wilburn Cornelia.  . Stroke ([place])   . TIA (transient ischemic attack)     Assessment: 79yo male who had TAVR last month c/o CP associated w/ SOB at rest, initial troponin elevated and increasing (190 > 1311), on heparin.  Heparin level is subtherapeutic at 0.26IU/mL. Hgb and HCT stable. PLT WNL.    Goal of Therapy:  Heparin level 0.3-0.7 units/ml Monitor platelets by anticoagulation protocol: Yes   Plan:  Per 7/9 cardiology note, plan is for medical management and to continue heparin x48 hours.   Will skip bolus and increase rate to 1,150 units per hour.  Repeat heparin level in 6 hours and monitor CBC.  Mercy Riding, PharmD

## 2019-09-09 NOTE — ED Notes (Signed)
Dr. Blossom Hoops or on call paged to [phone] Claiborne Billings, RN paged by Levada Dy

## 2019-09-10 ENCOUNTER — Inpatient Hospital Stay (HOSPITAL_COMMUNITY): Payer: Medicare Other

## 2019-09-10 DIAGNOSIS — Z952 Presence of prosthetic heart valve: Secondary | ICD-10-CM

## 2019-09-10 LAB — CBC
HCT: 38.6 % — ABNORMAL LOW (ref 39.0–52.0)
Hemoglobin: 12.7 g/dL — ABNORMAL LOW (ref 13.0–17.0)
MCH: 27.4 pg (ref 26.0–34.0)
MCHC: 32.9 g/dL (ref 30.0–36.0)
MCV: 83.4 fL (ref 80.0–100.0)
Platelets: 191 10*3/uL (ref 150–400)
RBC: 4.63 MIL/uL (ref 4.22–5.81)
RDW: 13.4 % (ref 11.5–15.5)
WBC: 7.9 10*3/uL (ref 4.0–10.5)
nRBC: 0 % (ref 0.0–0.2)

## 2019-09-10 LAB — HEPARIN LEVEL (UNFRACTIONATED)
Heparin Unfractionated: 0.37 IU/mL (ref 0.30–0.70)
Heparin Unfractionated: 0.25 IU/mL — ABNORMAL LOW (ref 0.30–0.70)
Heparin Unfractionated: 0.44 IU/mL (ref 0.30–0.70)

## 2019-09-10 LAB — GLUCOSE, CAPILLARY
Glucose-Capillary: 165 mg/dL — ABNORMAL HIGH (ref 70–99)
Glucose-Capillary: 172 mg/dL — ABNORMAL HIGH (ref 70–99)
Glucose-Capillary: 199 mg/dL — ABNORMAL HIGH (ref 70–99)
Glucose-Capillary: 184 mg/dL — ABNORMAL HIGH (ref 70–99)

## 2019-09-10 MED ORDER — ALUM & MAG HYDROXIDE-SIMETH 200-200-20 MG/5ML PO SUSP
30.0000 mL | ORAL | Status: DC | PRN
  Administered 2019-09-10: 30 mL via ORAL
  Filled 2019-09-10: qty 30

## 2019-09-10 MED ORDER — AMLODIPINE BESYLATE 5 MG PO TABS
5.0000 mg | ORAL_TABLET | Freq: Every day | ORAL | Status: DC
  Administered 2019-09-10 – 2019-09-13 (×4): 5 mg via ORAL
  Filled 2019-09-10 (×4): qty 1

## 2019-09-10 NOTE — Progress Notes (Signed)
ANTICOAGULATION CONSULT NOTE - Follow Up Consult  Pharmacy Consult for Heparin Indication: chest pain/ACS  Allergies  Allergen Reactions  . Macrodantin [Nitrofurantoin] Other (See Comments)    "blocked kidneys"   . Sulfa Antibiotics Rash  . Lisinopril Cough  . Penicillins     08/30/19- Patient can't remember, was 35+ years ago. OK with trying cephalexin in hospital    Patient Measurements: Height: 5\' 10"  (177.8 cm) Weight: 85.5 kg (188 lb 7.9 oz) IBW/kg (Calculated) : 73  Vital Signs: Temp: 97.8 F (36.6 C) (07/11 1209) Temp Source: Oral (07/11 1209) BP: 119/67 (07/11 1209) Pulse Rate: 79 (07/11 1209)  Labs: Recent Labs    09/08/19 2227 09/08/19 2349 09/09/19 0308 09/09/19 0308 09/09/19 0541 09/09/19 0920 09/09/19 1446 09/09/19 1659 09/09/19 2126 09/10/19 0402 09/10/19 0624 09/10/19 1310  HGB 13.1   < > 12.9*  --   --   --   --   --   --  12.7*  --   --   HCT 39.9  --  38.8*  --   --   --   --   --   --  38.6*  --   --   PLT 232  --  224  --   --   --   --   --   --  191  --   --   LABPROT  --   --  14.9  --   --   --   --   --   --   --   --   --   INR  --   --  1.2  --   --   --   --   --   --   --   --   --   HEPARINUNFRC  --   --   --   --   --    < >  --   --  0.13*  --  0.37 0.25*  CREATININE 0.79  --  0.73  --   --   --   --   --   --   --   --   --   TROPONINIHS 190*   < > 2,757*   < > 3,124*  --  1,189* 911*  --   --   --   --    < > = values in this interval not displayed.    Estimated Creatinine Clearance: 78.6 mL/min (by C-G formula based on SCr of 0.73 mg/dL).   Medical History: Past Medical History:  Diagnosis Date  . Anxiety   . CAD (coronary artery disease)   . Carotid artery disease ([place]) 08/24/2011  . Coronary artery disease involving native coronary artery of native heart with angina pectoris ([place]) 07/04/2019  . Diabetes mellitus without complication ([place])   . Excessive urination at night 06/05/2013  . 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 Hyperlipidemia   . Hypertension   . PVC (premature ventricular contraction)   . Renal mass   . 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 Burt Knack & Dr. Cyndia Bent  . Severe aortic stenosis   . Sinus bradycardia on ECG   . Sleep apnea   . 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 Wilburn Cornelia.  . Stroke ([place])   . TIA (transient ischemic attack)     Assessment: 79yo male  who had TAVR last month c/o CP associated w/ SOB at rest, initial troponin elevated and increasing (190 > 1311), on heparin.  Heparin level is subtherapeutic at 0.25 IU/mL. Hgb and HCT stable. PLT WNL. No IV or bleeding issues noted.    Goal of Therapy:  Heparin level 0.3-0.7 units/ml Monitor platelets by anticoagulation protocol: Yes   Plan:  Per 7/9 cardiology note, plan is for medical management and to continue heparin x48 hours.   Will increase heparin rate to 1550 units per hour. Repeat heparin level in 8 hours at 2200.   Rebbeca Paul, PharmD PGY1 Pharmacy Resident 09/10/2019 2:19 PM  Please check [email or web address] for unit-specific pharmacy phone numbers.

## 2019-09-10 NOTE — Progress Notes (Addendum)
[facility] VALVE TEAM  Patient Name: Alexis Griffith Date of Encounter: 09/10/2019  Primary Cardiologist: Dr. Antonieta Loveless Problem List     Active Problems:   NSTEMI (non-ST elevated myocardial infarction) [facility])     Subjective   Feeling much better today. Some mild chest pain intermittently. Has not been up walking around yet. Worried about balance  Inpatient Medications    Scheduled Meds: . amLODipine  2.5 mg Oral Daily  . aspirin EC  81 mg Oral Daily  . Chlorhexidine Gluconate Cloth  6 each Topical Daily  . clonazePAM  0.5 mg Oral QID  . fosfomycin  3 g Oral Once  . insulin aspart  0-15 Units Subcutaneous TID WC  . insulin aspart  0-5 Units Subcutaneous QHS  . isosorbide mononitrate  60 mg Oral Daily  . metoprolol tartrate  25 mg Oral BID  . pyridostigmine  30 mg Oral BID  . rosuvastatin  20 mg Oral q1800   Continuous Infusions: . heparin 1,350 Units/hr (09/09/19 2306)   PRN Meds: acetaminophen, hydrALAZINE, nitroGLYCERIN, ondansetron (ZOFRAN) IV   Vital Signs    Vitals:   09/09/19 1612 09/09/19 2021 09/10/19 0538 09/10/19 0721  BP: (!) 148/79 138/69  (!) 151/56  Pulse:  91  74  Resp:  16  15  Temp:  97.8 F (36.6 C) 98.3 F (36.8 C) 98.5 F (36.9 C)  TempSrc:  Oral Oral Oral  SpO2:  97%  94%  Weight:   85.5 kg   Height:        Intake/Output Summary (Last 24 hours) at 09/10/2019 0745 Last data filed at 09/10/2019 0541 Gross per 24 hour  Intake 660 ml  Output 4225 ml  Net -3565 ml   Filed Weights   09/08/19 2120 09/10/19 0538  Weight: 87.7 kg 85.5 kg    Physical Exam   GEN: Well nourished, well developed, in no acute distress.  HEENT: Grossly normal.  Neck: Supple, no JVD, carotid bruits, or masses. Cardiac: RRR, no murmurs, rubs, or gallops. No clubbing, cyanosis, edema.   Respiratory:  Respirations regular and unlabored, clear to auscultation bilaterally. GI: Soft, nontender, nondistended, BS + x  4. MS: no deformity or atrophy. Skin: warm and dry, no rash. Neuro:  Strength and sensation are intact. Psych: AAOx3.  Normal affect.  Labs    CBC Recent Labs    09/08/19 2227 09/08/19 2227 09/09/19 0308 09/10/19 0402  WBC 8.8   < > 7.7 7.9  NEUTROABS 6.6  --   --   --   HGB 13.1   < > 12.9* 12.7*  HCT 39.9   < > 38.8* 38.6*  MCV 81.6   < > 81.5 83.4  PLT 232   < > 224 191   < > = values in this interval not displayed.   Basic Metabolic Panel Recent Labs    09/08/19 2227 09/09/19 0308  NA 133* 137  K 3.4* 3.8  CL 101 103  CO2 23 22  GLUCOSE 218* 141*  BUN 12 11  CREATININE 0.79 0.73  CALCIUM 9.4 9.4  MG 1.6*  --    Liver Function Tests Recent Labs    09/08/19 2227  AST 22  ALT 20  ALKPHOS 50  BILITOT 0.4  PROT 6.3*  ALBUMIN 2.9*   No results for input(s): LIPASE, AMYLASE in the last 72 hours. Cardiac Enzymes No results for input(s): CKTOTAL, CKMB, CKMBINDEX, TROPONINI in the last 72 hours.  BNP Invalid input(s): POCBNP D-Dimer No results for input(s): DDIMER in the last 72 hours. Hemoglobin A1C No results for input(s): HGBA1C in the last 72 hours. Fasting Lipid Panel Recent Labs    09/09/19 0308  CHOL 130  HDL 40*  LDLCALC 76  TRIG 68  CHOLHDL 3.3   Thyroid Function Tests No results for input(s): TSH, T4TOTAL, T3FREE, THYROIDAB in the last 72 hours.  Invalid input(s): FREET3  Telemetry    Sinus with PACs and PVCs - Personally Reviewed  ECG    Sinus with PAC and PVC- Personally Reviewed  Radiology    DG Chest 2 View  Result Date: 09/08/2019 CLINICAL DATA:  Chest pain and shortness of breath. EXAM: CHEST - 2 VIEW COMPARISON:  August 11, 2019 FINDINGS: There is no evidence of acute infiltrate, pleural effusion or pneumothorax. A stable subcentimeter calcified nodular opacity is seen overlying the upper left lung. The heart size and mediastinal contours are within normal limits. An artificial aortic valve is seen. This represents a new  finding when compared to the prior study. The visualized skeletal structures are unremarkable. IMPRESSION: No active cardiopulmonary disease. Electronically Signed   By: Virgina Norfolk M.D.   On: 09/08/2019 22:16    Cardiac Studies    TTE: 08/16/2019  1. Left ventricular ejection fraction, by estimation, is 55 to 60%. The  left ventricle has normal function. The left ventricle demonstrates  regional wall motion abnormalities (see scoring diagram/findings for  description). There is mild concentric left  ventricular hypertrophy. Indeterminate diastolic filling due to E-A  fusion.  2. Right ventricular systolic function is normal. The right ventricular  size is normal.  3. The aortic valve is normal in structure. Aortic valve regurgitation is  not visualized. No aortic stenosis is present. Aortic valve mean gradient  measures 11.7 mmHg. Aortic valve Vmax measures 2.48 m/s.   Cath: 07/07/2019  Conclusion   Ost LM lesion is 30% stenosed.  Prox LAD to Mid LAD lesion is 70% stenosed with 70% stenosed side branch in 2nd Diag. Both vessels positive by CT FFR  Mid LAD to Dist LAD lesion is 40% stenosed. Dist LAD lesion is 75% stenosed.  Prox Cx lesion is 45% stenosed. Prox Cx to Mid Cx lesion is 75% stenosed. RFR positive is 0.87  Mid Cx lesion is 5% stenosed with 50% stenosed side branch in 3rd Mrg. No significant drop in RFR reading beyond this lesion.  Ost RCA to Dist RCA lesion is 100% stenosed. Significant left to right collaterals fill the PDA and PL system.  RPDA lesion is 50% stenosed.  --------------------------------------  The left ventricular systolic function is normal. The left ventricular ejection fraction is 55-65% by visual estimate. Regional wall motion knowledged and inferior wall.  LV end diastolic pressure is moderately elevated.  There is ~SEVERE AORTIC VALVE STENOSIS.   SUMMARY  Severe three-vessel disease: 100% CTO of ostial RCA (left to right  collaterals fill PDA and PL), long 70% calcified proximal to mid LAD (positive by CT FFR), segmental ~75% proximal-mid LCx (positive by RFR)  Borderline SEVERE AORTIC STENOSIS-mean gradient estimated 37 mmHg by pullback.  Normal LVEF with basal inferior hypokinesis seen on echocardiogram and LV gram.   Patient Profile     HARWOOD NALL is a 79 y.o. 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    Assessment & Plan    NSTEMI: HS trop 190->1311->2750->3124-> 911. In the settings of known obstructive multivessel disease and severe hypertension, troponin elevation slightly higher than what we would expect with demand ischemia only. Plan to focus on good BP control. If he continues to have symptoms we will discuss with interventional team about potential intervention to LAD.  Prior to TAVR he was consider for surgical CABG and AVR however was considered too high risk for possible complication after surgery and because he did not have typical angina medical management was determined with TAVR. -- Currently on IV heparin. Crestor increased to 20mg  daily.  -- Continue on Norvac 2.5mg  daily, imdur 60mg  daily, Lopressor 25mg  BID. BP still elevated. Will increase Norvasc to 5 mg daily. Watch carefully for orthostasis.  -- Repeat limited TTE pending  Episodic hypotension -- Daily orthostatic vitals  -- Midodrine discontinued, continue Mestinon. Will add back compression stockings now.   Recently diagnosed RCC, urinary retention, UTI/epitidimitis: still with foley cath in place from previous admission for urinary retention. Received last dose of fosfomycin here. He was supposed to see Dr. Tresa Moore at [facility]  tomorrow (7/12) for discussion about nephrectomy as well as voiding trial. Consider inpatient urology consult.   DMT2: continue SSI while admitted.  Severe AS s/p TAVR: continue on aspirin alone given hematuria.   Signed, Angelena Form, PA-C  09/10/2019, 7:45 AM  Pager [phone]  The patient was seen, examined and discussed with Nell Range, PA-C and I agree with the above.   The patient came with chest pain and hypertensive urgency with BP up to 240 mmHg. Elevated troponin 190->1311->2750->3124-->1189-->911. On iv Heparin.  Slight improvement of chest pressure this morning and he felt great however now is feeling chest pressure again and blood pressure was elevated at 176/94 mmHg.  The patient has known obstructive multivessel disease, Underwent coronary CTA in April 2021 that showed at least moderate lesion in proximal LAD and occluded ostial RCA, CT FFR was positive for proximal LAD with decrease of FFR from 0.90->0.76, I have reviewed his cardiac catheterization with Dr. Saunders Revel yesterday, he is proximal LAD lesion does not seem severe on cardiac catheterization but again he is coronary CTA and CT FFR were abnormal with FFR < 0.8. He was considered for potential CABG and AVR, however he was lacking typical symptoms and in the settings of newly diagnosed renal cell cancer, medical management for CAD and TAVR seemed to be a better option.  Ideally his symptoms improved with blood pressure management, however troponin elevation slightly higher than what we would expect with demand ischemia only.  We will try to uptitrate his blood pressure medications, he had orthostatic vitals checked this morning with drop in systolic blood pressure of 37 mmHg when going from sitting to standing position.  Plan:  -Continue aspirin 81 mg daily, heparin infusion ACS x 48 hours -metoprolol tartrate was increased to to 25 mg twice daily yesterday -Imdur was increased to 60 mg daily yesterday -Increase  amlodipine to 5 mg daily -Transthoracic echocardiogram, limited for potential new wall motion abnormalities is still pending - Crestor was increased to 20 mg daily  -If patient remains symptomatic despite good blood pressure control consider cardiac catheterization for revascularization of proximal LAD lesion.  Ena Dawley, MD 09/10/2019

## 2019-09-10 NOTE — Evaluation (Signed)
Physical Therapy Evaluation Patient Details Name: Alexis Griffith MRN: [id number] DOB: 1941-01-17 Today's Date: 09/10/2019   History of Present Illness  Pt is a 79 y/o male with hx of HTN, HLD, DM2, OSA, hx of CVA, multivessel CAD, renal cell carcinoma, severe AS s/p TAVR 9/44/96 with complicated post procedural course with orthostatic hypotension and urologic issues.  Presenting to ED on 7/10 with severe hypertension and chest pain found to have NSTEMI.   Clinical Impression   Pt admitted with above diagnosis. Admitted from SNF, where pt was working on physical rehab; was walking with RW with rehab staff; presents to PT with significant drop in BP form supine to standing (see below); Discussed case with Leigh, RN, who informed me that p tvery much hopes to be able to go home withhis wife;  Pt currently with functional limitations due to the deficits listed below (see PT Problem List). Pt will benefit from skilled PT to increase their independence and safety with mobility to allow discharge to the venue listed below.      09/10/19 1209  Orthostatic Lying   BP- Lying 118/66  Pulse- Lying 76  Orthostatic Sitting  BP- Sitting 130/65  Pulse- Sitting 84  Orthostatic Standing at 0 minutes  BP- Standing at 0 minutes (!) 89/53  Orthostatic Standing at 3 minutes  BP- Standing at 3 minutes (!) 72/55  Pulse- Standing at 3 minutes 80        Follow Up Recommendations Home health PT;Supervision - Intermittent  Can pt get a HHAide? Or HHRN for chronic disease management as well as therapies? If slow progress, must consider SNF    Equipment Recommendations  None recommended by PT    Recommendations for Other Services OT consult     Precautions / Restrictions Precautions Precautions: Fall Precaution Comments: Watch BP and HR      Mobility  Bed Mobility Overal bed mobility: Needs Assistance Bed Mobility: Supine to Sit     Supine to sit: Min assist;HOB elevated     General bed  mobility comments: assist with trunk from supine to sit  Transfers Overall transfer level: Needs assistance Equipment used: Rolling walker (2 wheeled) Transfers: Sit to/from Stand Sit to Stand: Min guard         General transfer comment: Min guard to steady in standing  Ambulation/Gait Ambulation/Gait assistance: Min guard Gait Distance (Feet):  (pivot steps bed to chaior) Assistive device: Rolling walker (2 wheeled)       General Gait Details: Slow, mostly steady gait with RW for support; HR stable. Drop in BP. See assessment for details.  Stairs            Wheelchair Mobility    Modified Rankin (Stroke Patients Only)       Balance Overall balance assessment: Needs assistance Sitting-balance support: Feet supported;No upper extremity supported Sitting balance-Leahy Scale: Good       Standing balance-Leahy Scale: Fair                               Pertinent Vitals/Pain Pain Assessment: No/denies pain Pain Intervention(s): Monitored during session    Home Living Family/patient expects to be discharged to:: Private residence Living Arrangements: Spouse/significant other Available Help at Discharge: Family (Unsure how much assist is available to pt at home) Type of Home: House Home Access: Stairs to enter   [employer] of Steps: 1 step (in the house) Home Layout: One level Home Equipment:  Walker - 2 wheels;Wheelchair - manual      Prior Function Level of Independence: Needs assistance   Gait / Transfers Assistance Needed: utilized walker at SNF  ADL's / Homemaking Assistance Needed: Needing min A from staff at [facility] for safety  Comments: Pt finishing rehab at SNF     Hand Dominance   Dominant Hand: Right    Extremity/Trunk Assessment   Upper Extremity Assessment Upper Extremity Assessment: Defer to OT evaluation    Lower Extremity Assessment Lower Extremity Assessment: Generalized weakness    Cervical / Trunk  Assessment Cervical / Trunk Assessment: Normal  Communication   Communication: No difficulties  Cognition Arousal/Alertness: Awake/alert Behavior During Therapy: Flat affect;WFL for tasks assessed/performed Overall Cognitive Status: Within Functional Limits for tasks assessed                                        General Comments General comments (skin integrity, edema, etc.): BP drop in standing    Exercises     Assessment/Plan    PT Assessment Patient needs continued PT services  PT Problem List Decreased strength;Decreased mobility;Decreased safety awareness;Decreased activity tolerance;Decreased balance;Decreased knowledge of use of DME;Cardiopulmonary status limiting activity       PT Treatment Interventions DME instruction;Therapeutic activities;Gait training;Therapeutic exercise;Patient/family education;Balance training;Stair training;Functional mobility training;Neuromuscular re-education    PT Goals (Current goals can be found in the Care Plan section)  Acute Rehab PT Goals Patient Stated Goal: hopes to be able to get home to wife PT Goal Formulation: With patient Time For Goal Achievement: 09/24/19 Potential to Achieve Goals: Good    Frequency Min 3X/week   Barriers to discharge        Co-evaluation               AM-PAC PT "6 Clicks" Mobility  Outcome Measure Help needed turning from your back to your side while in a flat bed without using bedrails?: None Help needed moving from lying on your back to sitting on the side of a flat bed without using bedrails?: None Help needed moving to and from a bed to a chair (including a wheelchair)?: A Little Help needed standing up from a chair using your arms (e.g., wheelchair or bedside chair)?: A Little Help needed to walk in hospital room?: A Little Help needed climbing 3-5 steps with a railing? : A Little 6 Click Score: 20    End of Session Equipment Utilized During Treatment: Gait  belt Activity Tolerance: Other (comment) (BP drop in standing) Patient left: in chair;with call bell/phone within reach;with chair alarm set Nurse Communication: Mobility status (BP{ drop) PT Visit Diagnosis: Other abnormalities of gait and mobility (R26.89);Muscle weakness (generalized) (M62.81);Other symptoms and signs involving the nervous system (R29.898)    Time: 1243-1310 PT Time Calculation (min) (ACUTE ONLY): 27 min   Charges:   PT Evaluation $PT Eval Moderate Complexity: 1 Mod PT Treatments $Therapeutic Activity: 8-22 mins        Roney Marion, PT  Acute Rehabilitation Services Pager [phone] Office [place] 09/10/2019, 8:08 PM

## 2019-09-10 NOTE — Progress Notes (Signed)
  Echocardiogram 2D Echocardiogram has been performed.  Alexis Griffith 09/10/2019, 5:58 PM

## 2019-09-10 NOTE — Evaluation (Signed)
Occupational Therapy Evaluation Patient Details Name: Alexis Griffith MRN: [id number] DOB: 27-Apr-1940 Today's Date: 09/10/2019    History of Present Illness Pt is a 79 y/o male with hx of HTN, HLD, DM2, OSA, hx of CVA, multivessel CAD, renal cell carcinoma, severe AS s/p TAVR 7/85/88 with complicated post procedural course with orthostatic hypotension and urologic issues.  Presenting to ED on 7/10 with severe hypertension and chest pain found to have NSTEMI.    Clinical Impression   PTA pt reports living at a SNF, ambulating with RW, and needing assist for safety with ADLs. Pt was admitted for above and treated for problem list below (see OT Problem List). Limited eval due to orthostatic BP and decreased awareness of symptoms - pt denied but noted swaying and eye glaze upon standing. Requires Set up - Min A with ADLs due to decreased activity tolerance, weakness, and decreased balance. Pt able to perform sit<>stand with Min guard for steadying in standing. Unable to ambulate due to BP will further assess next session - if safe. Pt A&Ox4 with decreased awareness of deficits and safety with precautions with activity with his BP and HR. Noted potential inattention of L side needing multimodal cues to look to left and with R side gaze - to be assessed further next session. Pt reported he would like to get a list of available SNFs for rehab due to his wife being unable to take care of him at home. Believe pt would benefit from skilled OT services acutely and at the SNF level to increase safety and independence with ADLs.  Vitals: Orthostatic lying: BP 197/62 (99) HR 92 Orthostatic sitting: BP 170/72 (101) HR 89 Orthostatic standing at 0 minutes: BP 160/77 (89) HR 113 Return to supine: BP 176/72 (110) HR 94    Follow Up Recommendations  SNF;Supervision/Assistance - 24 hour    Equipment Recommendations  Other (comment) (TBD at next venue of care)       Precautions / Restrictions  Precautions Precautions: Fall Precaution Comments: Watch BP and HR      Mobility Bed Mobility Overal bed mobility: Needs Assistance Bed Mobility: Supine to Sit     Supine to sit: Min assist;HOB elevated Sit to supine: Modified independent (Device/Increase time)   General bed mobility comments: assist with trunk from supine to sit  Transfers Overall transfer level: Needs assistance   Transfers: Sit to/from Stand Sit to Stand: Min guard         General transfer comment: Min guard to steady in standing    Balance Overall balance assessment: Needs assistance Sitting-balance support: Feet supported;No upper extremity supported Sitting balance-Leahy Scale: Good Sitting balance - Comments: supervision   Standing balance support: No upper extremity supported Standing balance-Leahy Scale: Fair Standing balance comment: Min guard for BP drop                           ADL either performed or assessed with clinical judgement   ADL Overall ADL's : Needs assistance/impaired     Grooming: Sitting;Set up Grooming Details (indicate cue type and reason): sitting for safety Upper Body Bathing: Set up;Sitting   Lower Body Bathing: Minimal assistance;Sit to/from stand;Sitting/lateral leans   Upper Body Dressing : Set up;Sitting   Lower Body Dressing: Minimal assistance;Sit to/from stand;Sitting/lateral leans     Toilet Transfer Details (indicate cue type and reason): deferred due to pt safety   Toileting - Clothing Manipulation Details (indicate cue type and reason): deferred  due to pt safety   Tub/Shower Transfer Details (indicate cue type and reason): deferred due to pt safety Functional mobility during ADLs: Min guard General ADL Comments: pt presenting with orthostatic bp changes. Able to complete sit/stand with min guard A. Eval limited due to BP     Vision Baseline Vision/History: Wears glasses Wears Glasses: At all times Patient Visual Report: No change  from baseline              Pertinent Vitals/Pain Pain Assessment: No/denies pain     Hand Dominance Right   Extremity/Trunk Assessment Upper Extremity Assessment Upper Extremity Assessment: Generalized weakness   Lower Extremity Assessment Lower Extremity Assessment: Defer to PT evaluation   Cervical / Trunk Assessment Cervical / Trunk Assessment: Normal   Communication Communication Communication: No difficulties   Cognition Arousal/Alertness: Awake/alert Behavior During Therapy: Flat affect;WFL for tasks assessed/performed Overall Cognitive Status: Within Functional Limits for tasks assessed                                 General Comments: Pt A&Ox4 with good awareness of upcoming procedures, tests, and appointments. Good awareness of barriers to dc home due to inadequate assistance at home. Decreased safety awareness and awareness of deficits with monitoring BP and HR with activities.   General Comments  Orthostatics when moving sit to stand             Home Living Family/patient expects to be discharged to:: Skilled nursing facility                                        Prior Functioning/Environment Level of Independence: Needs assistance  Gait / Transfers Assistance Needed: utilized walker at SNF ADL's / Homemaking Assistance Needed: Needing min A from staff at SNF for safety   Comments: Pt finishing rehab at SNF        OT Problem List: Decreased strength;Decreased knowledge of use of DME or AE;Decreased knowledge of precautions;Decreased activity tolerance;Impaired balance (sitting and/or standing);Decreased safety awareness;Cardiopulmonary status limiting activity      OT Treatment/Interventions: Self-care/ADL training;Therapeutic exercise;Patient/family education;Balance training;Energy conservation;Therapeutic activities;DME and/or AE instruction    OT Goals(Current goals can be found in the care plan section) Acute  Rehab OT Goals Patient Stated Goal: to get more rehab to feel better to go home OT Goal Formulation: With patient Time For Goal Achievement: 09/24/19 Potential to Achieve Goals: Good  OT Frequency: Min 2X/week   Barriers to D/C: Decreased caregiver support  Pt reports his wife cannot take care of him at home          AM-PAC OT "6 Clicks" Daily Activity     Outcome Measure Help from another person eating meals?: None Help from another person taking care of personal grooming?: A Little Help from another person toileting, which includes using toliet, bedpan, or urinal?: A Little Help from another person bathing (including washing, rinsing, drying)?: A Little Help from another person to put on and taking off regular upper body clothing?: A Little Help from another person to put on and taking off regular lower body clothing?: A Little 6 Click Score: 19   End of Session Equipment Utilized During Treatment: Gait belt Nurse Communication: Mobility status;Other (comment) (vitals)  Activity Tolerance: Treatment limited secondary to medical complications (Comment) (orthostatic BP) Patient left: in bed;with call bell/phone  within reach;with bed alarm set  OT Visit Diagnosis: Unsteadiness on feet (R26.81);Other abnormalities of gait and mobility (R26.89);Muscle weakness (generalized) (M62.81);Other symptoms and signs involving cognitive function                Time: 1287-8676 OT Time Calculation (min): 28 min Charges:  OT General Charges $OT Visit: 1 Visit OT Evaluation $OT Eval Moderate Complexity: 1 Mod OT Treatments $Self Care/Home Management : 8-22 mins  Ekta Dancer/OTS  Holleigh Crihfield 09/10/2019, 11:13 AM

## 2019-09-10 NOTE — Progress Notes (Signed)
ANTICOAGULATION CONSULT NOTE - Follow Up Consult  Pharmacy Consult for Heparin Indication: chest pain/ACS  Allergies  Allergen Reactions  . Macrodantin [Nitrofurantoin] Other (See Comments)    "blocked kidneys"   . Sulfa Antibiotics Rash  . Lisinopril Cough  . Penicillins     08/30/19- Patient can't remember, was 35+ years ago. OK with trying cephalexin in hospital    Patient Measurements: Height: 5\' 10"  (177.8 cm) Weight: 85.5 kg (188 lb 7.9 oz) IBW/kg (Calculated) : 73  Vital Signs: Temp: 98.5 F (36.9 C) (07/11 0721) Temp Source: Oral (07/11 0721) BP: 151/56 (07/11 0721) Pulse Rate: 74 (07/11 0721)  Labs: Recent Labs    09/08/19 2227 09/08/19 2349 09/09/19 0308 09/09/19 0308 09/09/19 0541 09/09/19 0920 09/09/19 1446 09/09/19 1659 09/09/19 2126 09/10/19 0402 09/10/19 0624  HGB 13.1   < > 12.9*  --   --   --   --   --   --  12.7*  --   HCT 39.9  --  38.8*  --   --   --   --   --   --  38.6*  --   PLT 232  --  224  --   --   --   --   --   --  191  --   LABPROT  --   --  14.9  --   --   --   --   --   --   --   --   INR  --   --  1.2  --   --   --   --   --   --   --   --   HEPARINUNFRC  --   --   --   --   --  0.26*  --   --  0.13*  --  0.37  CREATININE 0.79  --  0.73  --   --   --   --   --   --   --   --   TROPONINIHS 190*   < > 2,757*   < > 3,124*  --  1,189* 911*  --   --   --    < > = values in this interval not displayed.    Estimated Creatinine Clearance: 78.6 mL/min (by C-G formula based on SCr of 0.73 mg/dL).   Medical History: Past Medical History:  Diagnosis Date  . Anxiety   . CAD (coronary artery disease)   . Carotid artery disease ([place]) 08/24/2011  . Coronary artery disease involving native coronary artery of native heart with angina pectoris ([place]) 07/04/2019  . Diabetes mellitus without complication ([place])   . Excessive urination at night 06/05/2013  . 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 Hyperlipidemia   . Hypertension   . PVC (premature  ventricular contraction)   . Renal mass   . 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 Burt Knack & Dr. Cyndia Bent  . Severe aortic stenosis   . Sinus bradycardia on ECG   . Sleep apnea   . 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 Wilburn Cornelia.  . Stroke ([place])   . TIA (transient ischemic attack)     Assessment: 79yo male who had TAVR last month c/o CP associated w/ SOB at rest, initial troponin elevated and increasing (190 > 1311), on heparin.  Heparin level is therapeutic  at 0.37 IU/mL after rate adjustment last night. Hgb and HCT stable. PLT WNL. No IV or bleeding issues noted.    Goal of Therapy:  Heparin level 0.3-0.7 units/ml Monitor platelets by anticoagulation protocol: Yes   Plan:  Per 7/9 cardiology note, plan is for medical management and to continue heparin x48 hours.   Will continue heparin at 1350 units per hour. Repeat heparin level in 6 hours at 1230.   Rebbeca Paul, PharmD PGY1 Pharmacy Resident 09/10/2019 7:52 AM  Please check [email or web address] for unit-specific pharmacy phone numbers.

## 2019-09-10 NOTE — Progress Notes (Signed)
ANTICOAGULATION CONSULT NOTE - Follow Up Consult  Pharmacy Consult for Heparin Indication: chest pain/ACS  Allergies  Allergen Reactions  . Macrodantin [Nitrofurantoin] Other (See Comments)    "blocked kidneys"   . Sulfa Antibiotics Rash  . Lisinopril Cough  . Penicillins     08/30/19- Patient can't remember, was 35+ years ago. OK with trying cephalexin in hospital    Patient Measurements: Height: 5\' 10"  (177.8 cm) Weight: 85.5 kg (188 lb 7.9 oz) IBW/kg (Calculated) : 73  Vital Signs: Temp: 98.5 F (36.9 C) (07/11 1927) Temp Source: Oral (07/11 1927) BP: 134/59 (07/11 1927) Pulse Rate: 79 (07/11 1209)  Labs: Recent Labs    09/08/19 2227 09/08/19 2349 09/09/19 0308 09/09/19 0308 09/09/19 0541 09/09/19 0920 09/09/19 1446 09/09/19 1659 09/09/19 2126 09/10/19 0402 09/10/19 0624 09/10/19 1310 09/10/19 2143  HGB 13.1   < > 12.9*  --   --   --   --   --   --  12.7*  --   --   --   HCT 39.9  --  38.8*  --   --   --   --   --   --  38.6*  --   --   --   PLT 232  --  224  --   --   --   --   --   --  191  --   --   --   LABPROT  --   --  14.9  --   --   --   --   --   --   --   --   --   --   INR  --   --  1.2  --   --   --   --   --   --   --   --   --   --   HEPARINUNFRC  --   --   --   --   --    < >  --   --    < >  --  0.37 0.25* 0.44  CREATININE 0.79  --  0.73  --   --   --   --   --   --   --   --   --   --   TROPONINIHS 190*   < > 2,757*   < > 3,124*  --  1,189* 911*  --   --   --   --   --    < > = values in this interval not displayed.    Estimated Creatinine Clearance: 78.6 mL/min (by C-G formula based on SCr of 0.73 mg/dL).   Medical History: Past Medical History:  Diagnosis Date  . Anxiety   . CAD (coronary artery disease)   . Carotid artery disease ([place]) 08/24/2011  . Coronary artery disease involving native coronary artery of native heart with angina pectoris ([place]) 07/04/2019  . Diabetes mellitus without complication ([place])   . Excessive urination at  night 06/05/2013  . 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 Hyperlipidemia   . Hypertension   . PVC (premature ventricular contraction)   . Renal mass   . 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 Burt Knack & Dr. Cyndia Bent  . Severe aortic stenosis   . Sinus bradycardia on ECG   . Sleep apnea   . Squamous cell carcinoma of tongue (  [facility]) September 2012   Followed by Dr. Wilburn Cornelia.  . Stroke ([place])   . TIA (transient ischemic attack)     Assessment: 79yo male who had TAVR last month c/o CP associated w/ SOB at rest, initial troponin elevated and increasing (190 > 1311), on heparin.  Heparin level is at goal 0.44 IU/mL. Hgb and HCT stable. PLT WNL. No IV or bleeding issues noted.     Goal of Therapy:  Heparin level 0.3-0.7 units/ml Monitor platelets by anticoagulation protocol: Yes   Plan:  No change in heparin 48 hours of heparin will finish in am, stop time placed  Erin Hearing PharmD., BCPS Clinical Pharmacist 09/10/2019 10:31 PM

## 2019-09-11 DIAGNOSIS — D649 Anemia, unspecified: Secondary | ICD-10-CM

## 2019-09-11 DIAGNOSIS — I951 Orthostatic hypotension: Secondary | ICD-10-CM

## 2019-09-11 DIAGNOSIS — R339 Retention of urine, unspecified: Secondary | ICD-10-CM

## 2019-09-11 DIAGNOSIS — R072 Precordial pain: Secondary | ICD-10-CM

## 2019-09-11 DIAGNOSIS — I959 Hypotension, unspecified: Secondary | ICD-10-CM

## 2019-09-11 HISTORY — DX: Retention of urine, unspecified: R33.9

## 2019-09-11 HISTORY — DX: Hypomagnesemia: E83.42

## 2019-09-11 HISTORY — DX: Orthostatic hypotension: I95.1

## 2019-09-11 HISTORY — DX: Anemia, unspecified: D64.9

## 2019-09-11 LAB — CBC
HCT: 36.5 % — ABNORMAL LOW (ref 39.0–52.0)
Hemoglobin: 11.9 g/dL — ABNORMAL LOW (ref 13.0–17.0)
MCH: 27.2 pg (ref 26.0–34.0)
MCHC: 32.6 g/dL (ref 30.0–36.0)
MCV: 83.5 fL (ref 80.0–100.0)
Platelets: 174 10*3/uL (ref 150–400)
RBC: 4.37 MIL/uL (ref 4.22–5.81)
RDW: 13.2 % (ref 11.5–15.5)
WBC: 7.9 10*3/uL (ref 4.0–10.5)
nRBC: 0 % (ref 0.0–0.2)

## 2019-09-11 LAB — GLUCOSE, CAPILLARY
Glucose-Capillary: 156 mg/dL — ABNORMAL HIGH (ref 70–99)
Glucose-Capillary: 167 mg/dL — ABNORMAL HIGH (ref 70–99)
Glucose-Capillary: 207 mg/dL — ABNORMAL HIGH (ref 70–99)
Glucose-Capillary: 163 mg/dL — ABNORMAL HIGH (ref 70–99)

## 2019-09-11 LAB — HEMOGLOBIN AND HEMATOCRIT, BLOOD
HCT: 37.7 % — ABNORMAL LOW (ref 39.0–52.0)
Hemoglobin: 12.2 g/dL — ABNORMAL LOW (ref 13.0–17.0)

## 2019-09-11 LAB — MAGNESIUM: Magnesium: 1.8 mg/dL (ref 1.7–2.4)

## 2019-09-11 MED ORDER — INSULIN ASPART 100 UNIT/ML ~~LOC~~ SOLN
3.0000 [IU] | Freq: Three times a day (TID) | SUBCUTANEOUS | Status: DC
  Administered 2019-09-11 – 2019-09-13 (×5): 3 [IU] via SUBCUTANEOUS

## 2019-09-11 MED ORDER — INSULIN ASPART 100 UNIT/ML ~~LOC~~ SOLN
0.0000 [IU] | Freq: Three times a day (TID) | SUBCUTANEOUS | Status: DC
  Administered 2019-09-11 – 2019-09-12 (×3): 3 [IU] via SUBCUTANEOUS
  Administered 2019-09-12 – 2019-09-13 (×2): 2 [IU] via SUBCUTANEOUS

## 2019-09-11 NOTE — Discharge Summary (Addendum)
Discharge Summary    Patient ID: Alexis Griffith MRN: [id number]; DOB: 23-Mar-1940  Admit date: 09/08/2019 Discharge date: 09/12/2019  Primary Care Provider: Haywood Pao, MD  Primary Cardiologist: Mertie Moores, MD  Primary Structural Heart Physician: Sherren Mocha, MD  Primary Electrophysiologist:  Virl Axe, MD   Discharge Diagnoses    Principal Problem:   NSTEMI (non-ST elevated myocardial infarction) [facility]) Active Problems:   Hypertension   Hyperlipidemia LDL goal <70   Carotid artery disease ([place])   Coronary artery disease involving native coronary artery of native heart with angina pectoris ([place])   Diabetes mellitus type 2 in nonobese [facility])   Renal mass   S/P TAVR (transcatheter aortic valve replacement)   Orthostatic hypotension   Urinary retention   Mild anemia   Hypomagnesemia   Premature atrial contractions   Diagnostic Studies/Procedures    Limited echo 09/10/19 1. Stable findings 1 month post TAVR, normal function of the  bioprosthetic valve, mean transaortic gradient 12 mmHg, no paravalvular  leak.  2. Left ventricular ejection fraction, by estimation, is 60 to 65%. The  left ventricle has normal function. The left ventricle has no regional  wall motion abnormalities. There is moderate concentric left ventricular  hypertrophy. Left ventricular  diastolic parameters are consistent with Grade I diastolic dysfunction  (impaired relaxation). Elevated left atrial pressure.  3. Right ventricular systolic function is normal. The right ventricular  size is normal.  4. The mitral valve is normal in structure. Trivial mitral valve  regurgitation. No evidence of mitral stenosis.  5. The aortic valve has been repaired/replaced. Aortic valve  regurgitation is not visualized. No aortic stenosis is present. There is a  26 mm Edwards Sapien prosthetic (TAVR) valve present in the aortic  position. Procedure Date: 08/15/2019. Echo findings  are consistent with  normal structure and function of the aortic valve  prosthesis.  6. The inferior vena cava is normal in size with greater than 50%  respiratory variability, suggesting right atrial pressure of 3 mmHg.  _____________   History of Present Illness     Alexis Griffith is a 79 y.o. male with recent TAVR 08/15/19 with complex post-procedural course, DM, HTN, CVA, TIA, sleep apnea, multivessel CAD as above, chronic diastolic CHF, recently diagnosed renal cell carcinoma, pulmonary nodules who was readmitted with chest pain. He was recently managed by the structural heart team. (Prior to TAVR he was consider for surgical CABG + AVR however was considered too high risk for possible complication after surgery and because he did not have typical angina, medical management was recommended over PCI.) He underwent TAVR 08/15/19, with prolonged hospital course due to issues with acute urinary retention, hematuria, AKI, UTI/epididymoorchitis requiring antibiotic, MRI with 48mm infarct felt to be incidental/procedural related, orthostatic hypotension with recurrent syncope requiring midodrine, Mestinon, and short-term Florinef. He was discharged to [facility].  He presented back to the hospital with 09/08/2019 with severe chest pain at rest associated with hypertension. He reported anxiety for multiple reasons including the prospect of a nephrectomy for his renal cell carcinoma and difficulties with renewal of his insurance approval for rehabilitation. His troponin was elevated and he was admitted for further evaluation.   Hospital Course     1. NSTEMI in context of known multivessel CAD - HS trop190->1311->2750->3124-> 911.In the settings of known multivessel coronary disease and severe hypertension. Troponin elevation slightly higher than what we would expect with demand ischemia only.   - case was reviewed by Dr. Angelena Form and  Dr. Burt Knack who recommended medical therapy. Interventional approach is not  preferred given that he would have to be on DAPT post-PCI - not a good option as he had had recent hematuria requiring cessation of Plavix - he completed 48 hours of heparin while inpatient - he will continue ASA - statin titrated as below - if the patient is tolerating statin at time of follow-up appointment, would consider rechecking liver function/lipid panel in 6-8 weeks. - newly started antianginals include amlodipine and Imdur with titration of metoprolol - careful titration enacted given concomitant orthostatic drop in BP - limited echo 09/10/19 showed stable findings 1 month post TAVR with normal LVEF, grade 1 DD  2. Accelerated HTN with recent issues with orthostatic hypotension, presenting with elevated blood pressure - managing in context of the above - midodrine and mestinon discontinued due to now high blood pressure - no further drops in blood pressure with standing - he does have a rise in HR but is asymptomatic so may be due to deconditioning (was asymptomatic) - BP 149/57 this afternoon - continue to monitor at next facility, recommend contacting our office if consistently running higher than 300 systolic as outpatient as he acclimates to his new regimen  3. Severe AS s/p recent TAVR - has been on ASA alone given hematuria - limited echo as above, stable - remember that he needs SBE ppx going forward for any dental procedures - has follow-up scheduled for 09/27/19  4. Renal cell carcinoma with recent urinary retention and hematuria and UTI/epididymoorchitis - still with foley cath in place from previous admission for urinary retention. Completed last dose of fosfomycin while inpatient. He was supposed to see Dr. Tresa Moore at [facility] today as outpatient for discussion about nephrectomy as well as voiding trial - creatinine has remained normal this admission - discussed case via phone with Dr. Tresa Moore who has concerns about patient's candidacy for major surgery given evidence  of demand ischemia this admission, may consider more conservative non-operative approach - he recommends to keep foley in place and his office will contact patient to reschedule follow-up  5. DM type 2 - was on Lantus at home, held here due to variable hospital course. -per d/w DM coordinator, we have changed him to Novolog 3 units TIDWC meal coverage + sensitive SSI correction scale - per pharmacy med rec intake, patient was not taking Januvia for unclear reasons - will defer to primary care  6. Anemia - in context of recent issues above - Hgb trend11.8 -12.7 - 13.1 - 12.9 - 12.7 - 11.9 - 12.2 - foley bagdid not have anygross hematuria in it -anticipate continuing to monitor as outpatient periodically  7. Carotid artery disease - MRI last admission showed 4 mm acute infarct left medial frontalcortex, which was felt to be incidentaland procedural related.CTA of the head and neck show moderate 65 to 75% bilateral ICA stenoses  - will require continued surveillance and outpatient follow-up  8. Hyperlipidemia with goal LDL <70 given CAD - rosuvastatin titrated this admission - If the patient is tolerating statin at time of follow-up appointment, would consider rechecking liver function/lipid panel in 6 weeks  9. Hypomagnesemia - level was 1.6 on 09/08/10, improved to 1.8 yesterday - given mag sulfate today, can be monitored periodically in outpatient setting  We will keep appointment on 7/28 - per discussion with structural team, they will cancel the echo he had planned for that visit as the one he had here in the hospital will suffice. He  will return to [facility]. Meds are outlined below. 3 day rx provided for controlled substance, clonazepam - takes this chronically. Long term would suggested follow-up with primary care for this. Per PDMP review, patient was receiving routine fills by primary care without any recent inappropriate fills. Dr. Angelena Form has seen and examined  the patient today and feels he is stable for discharge.   Did the patient have an acute coronary syndrome (MI, NSTEMI, STEMI, etc) this admission?:  Yes                               AHA/ACC Clinical Performance & Quality Measures: 1. Aspirin prescribed? - Yes 2. ADP Receptor Inhibitor (Plavix/Clopidogrel, Brilinta/Ticagrelor or Effient/Prasugrel) prescribed (includes medically managed patients)? - No - hematuria 3. Beta Blocker prescribed? - Yes 4. High Intensity Statin (Lipitor 40-80mg  or Crestor 20-40mg ) prescribed? - Yes 5. EF assessed during THIS hospitalization? - Yes 6. For EF <40%, was ACEI/ARB prescribed? - Not Applicable (EF >/= 51%) 7. For EF <40%, Aldosterone Antagonist (Spironolactone or Eplerenone) prescribed? - Not Applicable (EF >/= 76%) 8. Cardiac Rehab Phase II ordered (including medically managed patients)? - No - patient not interested in referral (see 08/31/19 note), needs SNF first  _____________  Discharge Vitals Blood pressure (!) 149/57, pulse (!) 58, temperature 98.7 F (37.1 C), temperature source Oral, resp. rate 13, height 5\' 10"  (1.778 m), weight 81.6 kg, SpO2 98 %.  Filed Weights   09/10/19 0538 09/11/19 0409 09/12/19 0205  Weight: 85.5 kg 85 kg 81.6 kg    Labs & Radiologic Studies    CBC Recent Labs    09/11/19 0321 09/11/19 0321 09/11/19 1427 09/12/19 0406  WBC 7.9  --   --  8.7  HGB 11.9*   < > 12.2* 12.2*  HCT 36.5*   < > 37.7* 36.6*  MCV 83.5  --   --  83.4  PLT 174  --   --  170   < > = values in this interval not displayed.   Basic Metabolic Panel Recent Labs    09/11/19 1427 09/12/19 0406  NA  --  135  K  --  3.9  CL  --  101  CO2  --  24  GLUCOSE  --  151*  BUN  --  17  CREATININE  --  1.07  CALCIUM  --  9.2  MG 1.8  --    Liver Function Tests No results for input(s): AST, ALT, ALKPHOS, BILITOT, PROT, ALBUMIN in the last 72 hours. High Sensitivity Troponin:   Recent Labs  Lab 09/08/19 2349 09/09/19 0308 09/09/19 0541  09/09/19 1446 09/09/19 1659  TROPONINIHS 1,311* 2,757* 3,124* 1,189* 911*    _____________  CT ANGIO HEAD W OR WO CONTRAST  Result Date: 08/16/2019 CLINICAL DATA:  Stroke follow-up. Subcentimeter left frontal infarct on MRI. EXAM: CT ANGIOGRAPHY HEAD AND NECK TECHNIQUE: Multidetector CT imaging of the head and neck was performed using the standard protocol during bolus administration of intravenous contrast. Multiplanar CT image reconstructions and MIPs were obtained to evaluate the vascular anatomy. Carotid stenosis measurements (when applicable) are obtained utilizing NASCET criteria, using the distal internal carotid diameter as the denominator. CONTRAST:  55mL OMNIPAQUE IOHEXOL 350 MG/ML SOLN COMPARISON:  Head MRI 08/16/2019.  Head and neck CTA 12/31/2011. FINDINGS: CT HEAD FINDINGS Brain: The punctate medial left frontal cortical infarct on MRI is not well demonstrated by CT. No acute large territory  infarct, intracranial hemorrhage, mass, midline shift, or extra-axial fluid collection is identified. The ventricles and sulci are within normal limits for age. Vascular: Calcified atherosclerosis at the skull base. Skull: No fracture or suspicious osseous lesion. Sinuses: Minimal mucosal thickening in the paranasal sinuses. Clear mastoid air cells. Orbits: Bilateral cataract extraction. Review of the MIP images confirms the above findings CTA NECK FINDINGS Aortic arch: Standard 3 vessel aortic arch with mild atherosclerotic plaque. No significant arch vessel origin stenosis. Right carotid system: Patent with prominent, largely calcified plaque at the carotid bifurcation resulting in progressive high-grade stenosis of the distal common and proximal internal and external carotid arteries. Luminal visualization is limited by the calcified plaque, however ICA origin stenosis is estimated at 75%. Left carotid system: Patent with predominantly calcified plaque at the carotid bifurcation resulting in progressive  ICA origin stenosis estimated at 65-70%. Vertebral arteries: Patent and strongly dominant left vertebral artery with mild origin stenosis due to calcified plaque, unchanged. Diminutive right vertebral artery with chronic occlusion beginning at the V3 level. Skeleton: No acute osseous abnormality or suspicious osseous lesion. Other neck: No evidence of cervical lymphadenopathy or mass. Upper chest: Small pulmonary nodules including a 9 x 4 mm nodule in the left upper lobe, more fully evaluated on chest CT and PET-CT last month. Mild dependent atelectasis in both lungs. Review of the MIP images confirms the above findings CTA HEAD FINDINGS Anterior circulation: The internal carotid arteries are patent from skull base to carotid termini with asymmetric atherosclerotic plaque on the left resulting in mild left cavernous and proximal supraclinoid stenoses, similar to the prior CTA. ACAs and MCAs are patent without evidence of a proximal branch occlusion. There are new moderate right and mild left M1 stenoses. The right A1 segment is dominant. An infundibulum is noted at the right posterior communicating origin. Posterior circulation: The intracranial left vertebral artery is widely patent and supplies the basilar. 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 The basilar artery is widely patent. Patent SCAs are seen bilaterally. Both PCAs are patent with mild irregularity but no significant proximal stenosis. No aneurysm is identified. Venous sinuses: Patent. Anatomic variants: None of significance. Review of the MIP images confirms the above findings IMPRESSION: 1. Progressive cervical carotid atherosclerosis with 75% right and 65-70% left proximal ICA stenoses. 2. 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 3. New moderate right and mild left M1 stenoses. 4. Unchanged mild left intracranial ICA stenosis. 5. Aortic Atherosclerosis (ICD10-I70.0). Electronically Signed   By:  Logan Bores M.D.   On: 08/16/2019 20:54   DG Chest 2 View  Result Date: 09/08/2019 CLINICAL DATA:  Chest pain and shortness of breath. EXAM: CHEST - 2 VIEW COMPARISON:  August 11, 2019 FINDINGS: There is no evidence of acute infiltrate, pleural effusion or pneumothorax. A stable subcentimeter calcified nodular opacity is seen overlying the upper left lung. The heart size and mediastinal contours are within normal limits. An artificial aortic valve is seen. This represents a new finding when compared to the prior study. The visualized skeletal structures are unremarkable. IMPRESSION: No active cardiopulmonary disease. Electronically Signed   By: Virgina Norfolk M.D.   On: 09/08/2019 22:16   CT ANGIO NECK W OR WO CONTRAST  Result Date: 08/16/2019 CLINICAL DATA:  Stroke follow-up. Subcentimeter left frontal infarct on MRI. EXAM: CT ANGIOGRAPHY HEAD AND NECK TECHNIQUE: Multidetector CT imaging of the head and neck was performed using the standard protocol during bolus administration of intravenous contrast. Multiplanar CT  image reconstructions and MIPs were obtained to evaluate the vascular anatomy. Carotid stenosis measurements (when applicable) are obtained utilizing NASCET criteria, using the distal internal carotid diameter as the denominator. CONTRAST:  65mL OMNIPAQUE IOHEXOL 350 MG/ML SOLN COMPARISON:  Head MRI 08/16/2019.  Head and neck CTA 12/31/2011. FINDINGS: CT HEAD FINDINGS Brain: The punctate medial left frontal cortical infarct on MRI is not well demonstrated by CT. No acute large territory infarct, intracranial hemorrhage, mass, midline shift, or extra-axial fluid collection is identified. The ventricles and sulci are within normal limits for age. Vascular: Calcified atherosclerosis at the skull base. Skull: No fracture or suspicious osseous lesion. Sinuses: Minimal mucosal thickening in the paranasal sinuses. Clear mastoid air cells. Orbits: Bilateral cataract extraction. Review of the MIP images  confirms the above findings CTA NECK FINDINGS Aortic arch: Standard 3 vessel aortic arch with mild atherosclerotic plaque. No significant arch vessel origin stenosis. Right carotid system: Patent with prominent, largely calcified plaque at the carotid bifurcation resulting in progressive high-grade stenosis of the distal common and proximal internal and external carotid arteries. Luminal visualization is limited by the calcified plaque, however ICA origin stenosis is estimated at 75%. Left carotid system: Patent with predominantly calcified plaque at the carotid bifurcation resulting in progressive ICA origin stenosis estimated at 65-70%. Vertebral arteries: Patent and strongly dominant left vertebral artery with mild origin stenosis due to calcified plaque, unchanged. Diminutive right vertebral artery with chronic occlusion beginning at the V3 level. Skeleton: No acute osseous abnormality or suspicious osseous lesion. Other neck: No evidence of cervical lymphadenopathy or mass. Upper chest: Small pulmonary nodules including a 9 x 4 mm nodule in the left upper lobe, more fully evaluated on chest CT and PET-CT last month. Mild dependent atelectasis in both lungs. Review of the MIP images confirms the above findings CTA HEAD FINDINGS Anterior circulation: The internal carotid arteries are patent from skull base to carotid termini with asymmetric atherosclerotic plaque on the left resulting in mild left cavernous and proximal supraclinoid stenoses, similar to the prior CTA. ACAs and MCAs are patent without evidence of a proximal branch occlusion. There are new moderate right and mild left M1 stenoses. The right A1 segment is dominant. An infundibulum is noted at the right posterior communicating origin. Posterior circulation: The intracranial left vertebral artery is widely patent and supplies the basilar. 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 The basilar artery is widely patent. Patent SCAs  are seen bilaterally. Both PCAs are patent with mild irregularity but no significant proximal stenosis. No aneurysm is identified. Venous sinuses: Patent. Anatomic variants: None of significance. Review of the MIP images confirms the above findings IMPRESSION: 1. Progressive cervical carotid atherosclerosis with 75% right and 65-70% left proximal ICA stenoses. 2. 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 3. New moderate right and mild left M1 stenoses. 4. Unchanged mild left intracranial ICA stenosis. 5. Aortic Atherosclerosis (ICD10-I70.0). Electronically Signed   By: Logan Bores M.D.   On: 08/16/2019 20:54   MR BRAIN WO CONTRAST  Result Date: 08/16/2019 CLINICAL DATA:  Ataxia.  Rule out stroke.  TAVR 08/15/2019 EXAM: MRI HEAD WITHOUT CONTRAST TECHNIQUE: Multiplanar, multiecho pulse sequences of the brain and surrounding structures were obtained without intravenous contrast. COMPARISON:  MRI head 01/18/2012 FINDINGS: Brain: Small focus of acute infarction in the left medial frontal lobe. This measures approximately 4 mm in diameter. No other acute infarct. Moderate atrophy. Negative for hydrocephalus. Normal white matter. Negative for hemorrhage or mass. Vascular: Normal  arterial flow voids. Distal right vertebral artery appears hypoplastic unchanged from prior studies. Skull and upper cervical spine: No focal skeletal lesion. Sinuses/Orbits: Moderate mucosal edema paranasal cataract extraction sinuses. Bilateral Other: None IMPRESSION: 4 mm acute infarct left medial frontal cortex Generalized atrophy.  No significant chronic ischemic change. Electronically Signed   By: Franchot Gallo M.D.   On: 08/16/2019 14:57   CT ABDOMEN PELVIS W CONTRAST  Result Date: 08/24/2019 CLINICAL DATA:  79 year old male with abdominal pain, nausea vomiting. EXAM: CT ABDOMEN AND PELVIS WITH CONTRAST TECHNIQUE: Multidetector CT imaging of the abdomen and pelvis was performed using the standard protocol  following bolus administration of intravenous contrast. CONTRAST:  156mL OMNIPAQUE IOHEXOL 300 MG/ML  SOLN COMPARISON:  None. FINDINGS: Lower chest: The visualized lung bases are clear. There is coronary vascular calcification. No intra-abdominal free air or free fluid. Hepatobiliary: The liver is unremarkable. No intrahepatic biliary duct dilatation. There is a small stone within gallbladder. No pericholecystic fluid or evidence of acute cholecystitis by CT. Pancreas: Unremarkable. No pancreatic ductal dilatation or surrounding inflammatory changes. Spleen: Normal in size without focal abnormality. Adrenals/Urinary Tract: The adrenal glands are unremarkable. There is a 5.3 x 8.7 x 7.0 cm heterogeneously enhancing, partially exophytic mass from the lateral interpolar left kidney with areas central necrosis most consistent with malignancy. Further characterization with renal mass protocol MRI is recommended. Several small cysts measure up to 5 cm in the upper pole of the left kidney. Subcentimeter right renal hypodense focus is too small to characterize. Several small nonobstructing bilateral renal calculi measure up to 3 mm in the upper pole of the left kidney. There is no hydronephrosis on either side. There is symmetric enhancement and excretion of contrast by both kidneys. The visualized ureters and urinary bladder appear unremarkable. Stomach/Bowel: There is sigmoid diverticulosis and scattered colonic diverticula without active inflammatory changes. There is no bowel obstruction or active inflammation. Focal stranding adjacent to the left paracolic gutter likely reactive to left renal mass. The appendix is normal. Vascular/Lymphatic: Advanced aortoiliac atherosclerotic disease. The IVC is unremarkable. No portal venous gas. There is no adenopathy. Reproductive: The prostate gland is enlarged measuring 7 cm in transverse axial diameter. Other: Right inguinal subcutaneous stranding, possibly related to recent  procedure. Clinical correlation is recommended. No large fluid collection or hematoma. Musculoskeletal: No acute osseous pathology. No suspicious osseous lesion. Bilateral L5 pars defects with grade 1 L5-S1 anterolisthesis. IMPRESSION: 1. Left renal interpolar mass most consistent with malignancy. Further characterization with renal mass protocol MRI is recommended. 2. Colonic diverticulosis. No bowel obstruction. Normal appendix. 3. Cholelithiasis. 4. Enlarged prostate gland. 5. Bilateral L5 pars defects with grade 1 L5-S1 anterolisthesis. 6. Aortic Atherosclerosis (ICD10-I70.0). Electronically Signed   By: Anner Crete M.D.   On: 08/24/2019 19:50   US SCROTUM W/DOPPLER  Result Date: 08/30/2019 CLINICAL DATA:  Right testicular pain, swelling EXAM: SCROTAL ULTRASOUND DOPPLER ULTRASOUND OF THE TESTICLES TECHNIQUE: Complete ultrasound examination of the testicles, epididymis, and other scrotal structures was performed. Color and spectral Doppler ultrasound were also utilized to evaluate blood flow to the testicles. COMPARISON:  None. FINDINGS: Right testicle Measurements: 3.9 x 3.4 x 3.1 cm. No mass or microlithiasis visualized. Left testicle Measurements: 4.7 x 2.3 x 2.1 cm. No mass or microlithiasis visualized. Right epididymis: Enlarged, heterogeneous. Complex area in the epididymal head measuring up to 1.2 cm. Left epididymis:  Normal in size and appearance. Hydrocele:  None visualized. Varicocele:  None visualized. Pulsed Doppler interrogation of both testes demonstrates normal low  resistance arterial and venous waveforms bilaterally. IMPRESSION: Heterogeneous, enlarged right epididymis may reflect epididymitis. There is a complex hypoechoic area centrally within the epididymis which could reflect complex cyst/spermatocele or abscess. This could be followed after a course of antibiotic treatment. No evidence of testicular mass or torsion. Electronically Signed   By: Rolm Baptise M.D.   On: 08/30/2019  16:36   VAS US CAROTID  Result Date: 08/18/2019 Carotid Arterial Duplex Study Indications:       CVA and CTA showed carotid stenosis progression, but will                    need to compare with carotid doppler. Risk Factors:      Hypertension, hyperlipidemia, Diabetes, prior CVA. Comparison Study:  06/01/19 previous Performing Technologist: Abram Sander RVS  Examination Guidelines: A complete evaluation includes B-mode imaging, spectral Doppler, color Doppler, and power Doppler as needed of all accessible portions of each vessel. Bilateral testing is considered an integral part of a complete examination. Limited examinations for reoccurring indications may be performed as noted.  Right Carotid Findings: +----------+--------+--------+--------+------------------+--------+           PSV cm/sEDV cm/sStenosisPlaque DescriptionComments +----------+--------+--------+--------+------------------+--------+ CCA Prox  78      7               heterogenous               +----------+--------+--------+--------+------------------+--------+ CCA Distal53      7               heterogenous               +----------+--------+--------+--------+------------------+--------+ ICA Prox  305     45      40-59%  heterogenous               +----------+--------+--------+--------+------------------+--------+ ICA Mid   132     24                                         +----------+--------+--------+--------+------------------+--------+ ICA Distal77      17                                         +----------+--------+--------+--------+------------------+--------+ ECA       541                                                +----------+--------+--------+--------+------------------+--------+ +----------+--------+-------+--------+-------------------+           PSV cm/sEDV cmsDescribeArm Pressure (mmHG) +----------+--------+-------+--------+-------------------+ Subclavian109                                         +----------+--------+-------+--------+-------------------+ +---------+--------+--+--------+---------+ VertebralPSV cm/s35EDV cm/sAntegrade +---------+--------+--+--------+---------+  Left Carotid Findings: +----------+--------+--------+--------+------------------+--------+           PSV cm/sEDV cm/sStenosisPlaque DescriptionComments +----------+--------+--------+--------+------------------+--------+ CCA Prox  123                     heterogenous               +----------+--------+--------+--------+------------------+--------+ CCA Distal130  heterogenous               +----------+--------+--------+--------+------------------+--------+ ICA Prox  137     35      1-39%   heterogenous               +----------+--------+--------+--------+------------------+--------+ ICA Distal97      17                                         +----------+--------+--------+--------+------------------+--------+ ECA       167                                                +----------+--------+--------+--------+------------------+--------+ +----------+--------+--------+--------+-------------------+           PSV cm/sEDV cm/sDescribeArm Pressure (mmHG) +----------+--------+--------+--------+-------------------+ Subclavian115                                         +----------+--------+--------+--------+-------------------+ +---------+--------+--+--------+--+---------+ VertebralPSV cm/s44EDV cm/s12Antegrade +---------+--------+--+--------+--+---------+   Summary: Right Carotid: 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 Left Carotid: Velocities in the left ICA are consistent with a 1-39% stenosis. Vertebrals: Bilateral vertebral arteries demonstrate antegrade flow. *See table(s) above for measurements and observations.  Electronically signed by Antony Contras MD on 08/18/2019 at 11:00:32 AM.    Final      ECHOCARDIOGRAM LIMITED  Result Date: 09/12/2019    ECHOCARDIOGRAM LIMITED REPORT   Patient Name:   Alexis Griffith Date of Exam: 09/10/2019 Medical Rec #:  [id number]      Height:       70.0 in Accession #:    [id number]     Weight:       188.5 lb Date of Birth:  [birth year]      BSA:          2.035 m Patient Age:    19 years       BP:           161/72 mmHg Patient Gender: M              HR:           74 bpm. Exam Location:  Inpatient Procedure: Limited Echo, Limited Color Doppler and Cardiac Doppler Indications:    ACS/S/P TAVR 1 month  History:        Patient has prior history of Echocardiogram examinations, most                 recent 08/16/2019.                 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 Procedure Date:                 08/15/2019.  Sonographer:    Clayton Lefort RDCS (AE) Referring Phys: [id number] CARRIEL T NIPP IMPRESSIONS  1. Stable findings 1 month post TAVR, normal function of the bioprosthetic valve, mean transaortic gradient 12 mmHg, no paravalvular leak.  2.  Left ventricular ejection fraction, by estimation, is 60 to 65%. The left ventricle has normal function. The left ventricle has no regional wall motion abnormalities. There is moderate concentric left ventricular hypertrophy. Left ventricular diastolic parameters are consistent with Grade I diastolic dysfunction (impaired relaxation). Elevated left atrial pressure.  3. Right ventricular systolic function is normal. The right ventricular size is normal.  4. The mitral valve is normal in structure. Trivial mitral valve regurgitation. No evidence of mitral stenosis.  5. The aortic valve has been repaired/replaced. Aortic valve regurgitation is not visualized. No aortic stenosis is present. 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 Procedure Date: 08/15/2019. Echo findings  are consistent with normal structure and function of  the aortic valve prosthesis.  6. The inferior vena cava is normal in size with greater than 50% respiratory variability, suggesting right atrial pressure of 3 mmHg. FINDINGS  Left Ventricle: Left ventricular ejection fraction, by estimation, is 60 to 65%. The left ventricle has normal function. The left ventricle has no regional wall motion abnormalities. The left ventricular internal cavity size was normal in size. 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 Elevated left atrial pressure. Right Ventricle: The right ventricular size is normal. No increase in right ventricular wall thickness. Right ventricular systolic function is normal. Left Atrium: Left atrial size was normal in size. Right Atrium: Right atrial size was normal in size. Pericardium: There is no evidence of pericardial effusion. Mitral Valve: The mitral valve is normal in structure. Normal mobility of the mitral valve leaflets. Trivial mitral valve regurgitation. No evidence of mitral valve stenosis. Tricuspid Valve: The tricuspid valve is normal in structure. Tricuspid valve regurgitation is mild . No evidence of tricuspid stenosis. Aortic Valve: The aortic valve has been repaired/replaced. Aortic valve regurgitation is not visualized. No aortic stenosis is present. Aortic valve mean gradient measures 12.0 mmHg. Aortic valve peak gradient measures 23.4 mmHg. Aortic valve area, by VTI measures 1.96 cm. 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 Procedure Date: 08/15/2019. Echo findings are consistent with normal structure and function of the aortic valve prosthesis. Pulmonic Valve: The pulmonic valve was normal in structure. Pulmonic valve regurgitation is not visualized. No evidence of pulmonic stenosis. Aorta: The aortic root is normal in size and structure. Venous: The inferior vena cava is normal in size with greater than 50% respiratory variability, suggesting right atrial  pressure of 3 mmHg. IAS/Shunts: No atrial level shunt detected by color flow Doppler.  LEFT VENTRICLE PLAX 2D LVIDd:         3.80 cm  Diastology LVIDs:         3.40 cm  LV e' lateral:   6.90 cm/s LV PW:         1.70 cm  LV E/e' lateral: 8.9 LV IVS:        1.80 cm  LV e' medial:    4.20 cm/s LVOT diam:     2.60 cm  LV E/e' medial:  14.7 LV SV:         87 LV SV Index:   43 LVOT Area:     5.31 cm  IVC IVC diam: 1.60 cm LEFT ATRIUM         Index LA diam:    3.20 cm 1.57 cm/m  AORTIC VALVE AV Area (Vmax):    2.03 cm AV Area (Vmean):   2.11 cm AV Area (VTI):  1.96 cm AV Vmax:           242.00 cm/s AV Vmean:          159.000 cm/s AV VTI:            0.442 m AV Peak Grad:      23.4 mmHg AV Mean Grad:      12.0 mmHg LVOT Vmax:         92.70 cm/s LVOT Vmean:        63.100 cm/s LVOT VTI:          0.163 m LVOT/AV VTI ratio: 0.37  AORTA Ao Root diam: 3.40 cm Ao Asc diam:  3.10 cm MITRAL VALVE MV Area (PHT): 2.37 cm    SHUNTS MV Decel Time: 320 msec    Systemic VTI:  0.16 m MV E velocity: 61.70 cm/s  Systemic Diam: 2.60 cm MV A velocity: 88.30 cm/s MV E/A ratio:  0.70 Ena Dawley MD Electronically signed by Ena Dawley MD Signature Date/Time: 09/12/2019/10:42:31 AM    Final    ECHOCARDIOGRAM LIMITED  Result Date: 08/16/2019    ECHOCARDIOGRAM REPORT   Patient Name:   Alexis Griffith Date of Exam: 08/16/2019 Medical Rec #:  [id number]      Height:       70.0 in Accession #:    [id number]     Weight:       195.1 lb Date of Birth:  [birth year]      BSA:          2.065 m Patient Age:    70 years       BP:           146/60 mmHg Patient Gender: M              HR:           91 bpm. Exam Location:  Inpatient Procedure: Limited Echo, Cardiac Doppler and Color Doppler Indications:    Post TAVR evaluation  History:        Patient has prior history of Echocardiogram examinations, most                 recent 08/15/2019. CHF, CAD, Arrythmias:PVC; Risk                 Factors:Hypertension, Diabetes, Sleep Apnea and Former Smoker.                  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  Sonographer:    Clayton Lefort RDCS (AE) Referring Phys: [id number] [place]  1. Left ventricular ejection fraction, by estimation, is 55 to 60%. The left ventricle has normal function. The left ventricle demonstrates regional wall motion abnormalities (see scoring diagram/findings for description). There is mild concentric left ventricular hypertrophy. Indeterminate diastolic filling due to E-A fusion.  2. Right ventricular systolic function is normal. The right ventricular size is normal.  3. The mitral valve is normal in structure. No evidence of mitral valve regurgitation. No evidence of mitral stenosis.  4. The aortic valve is normal in structure. Aortic valve regurgitation is not visualized. No aortic stenosis is present. Aortic valve mean gradient measures 11.7 mmHg. Aortic valve Vmax measures 2.48 m/s.  5. The inferior vena cava is normal in size with greater than 50% respiratory variability, suggesting right atrial pressure of 3 mmHg. FINDINGS  Left Ventricle: Left ventricular ejection fraction, by estimation, is 55 to 60%. The left ventricle has normal function. The left ventricle demonstrates regional wall  motion abnormalities. The left ventricular internal cavity size was normal in size. There is mild concentric left ventricular hypertrophy. Indeterminate diastolic filling due to E-A fusion.  LV Wall Scoring: The basal inferolateral segment and basal inferior segment are hypokinetic. Right Ventricle: The right ventricular size is normal. No increase in right ventricular wall thickness. Right ventricular systolic function is normal. Left Atrium: Left atrial size was normal in size. Right Atrium: Right atrial size was normal in size. Pericardium: There is no evidence of pericardial effusion. Mitral Valve: The mitral valve is normal in structure. Normal mobility of the mitral valve leaflets. No evidence of mitral valve regurgitation. No evidence of  mitral valve stenosis. Tricuspid Valve: The tricuspid valve is normal in structure. Tricuspid valve regurgitation is not demonstrated. No evidence of tricuspid stenosis. Aortic Valve: The aortic valve is normal in structure. Aortic valve regurgitation is not visualized. No aortic stenosis is present. Aortic valve mean gradient measures 11.7 mmHg. Aortic valve peak gradient measures 24.6 mmHg. Aortic valve area, by VTI measures 1.72 cm. Pulmonic Valve: The pulmonic valve was normal in structure. Pulmonic valve regurgitation is not visualized. No evidence of pulmonic stenosis. Aorta: The aortic root is normal in size and structure. Venous: The inferior vena cava is normal in size with greater than 50% respiratory variability, suggesting right atrial pressure of 3 mmHg. IAS/Shunts: No atrial level shunt detected by color flow Doppler.  LEFT VENTRICLE PLAX 2D LVIDd:         4.80 cm LVIDs:         3.60 cm LV PW:         1.00 cm LV IVS:        1.50 cm LVOT diam:     2.60 cm LV SV:         71 LV SV Index:   34 LVOT Area:     5.31 cm  IVC IVC diam: 1.70 cm LEFT ATRIUM         Index LA diam:    4.10 cm 1.99 cm/m  AORTIC VALVE AV Area (Vmax):    1.85 cm AV Area (Vmean):   1.91 cm AV Area (VTI):     1.72 cm AV Vmax:           248.00 cm/s AV Vmean:          157.000 cm/s AV VTI:            0.413 m AV Peak Grad:      24.6 mmHg AV Mean Grad:      11.7 mmHg LVOT Vmax:         86.20 cm/s LVOT Vmean:        56.400 cm/s LVOT VTI:          0.134 m LVOT/AV VTI ratio: 0.32  AORTA Ao Root diam: 2.90 cm  SHUNTS Systemic VTI:  0.13 m Systemic Diam: 2.60 cm Sanda Klein MD Electronically signed by Sanda Klein MD Signature Date/Time: 08/16/2019/12:53:54 PM    Final    ECHOCARDIOGRAM LIMITED  Result Date: 08/15/2019    ECHOCARDIOGRAM LIMITED REPORT   Patient Name:   Alexis Griffith Date of Exam: 08/15/2019 Medical Rec #:  [id number]      Height:       70.0 in Accession #:    [id number]     Weight:       200.0 lb Date of Birth:   07-21-1940      BSA:  2.087 m Patient Age:    7 years       BP:           170/43 mmHg Patient Gender: M              HR:           63 bpm. Exam Location:  Inpatient Procedure: Limited Echo, Cardiac Doppler and Color Doppler Indications:     Aortic stenosis  History:         Patient has prior history of Echocardiogram examinations, most                  recent 07/18/2019. CAD, Carotid Disease and TIA, Aortic Valve                  Disease; Risk Factors:Hypertension, Dyslipidemia and Diabetes.                  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 Procedure Date:                  08/15/2019.  Sonographer:     Dustin Flock Referring Phys:  [id number] Woodfin Ganja THOMPSON Diagnosing Phys: Sanda Klein MD                   PREOPERATIVE FINDINGS:                   Normal left ventricular systolic function, Estimated LV                  ejection fraction 55-60%. Basal inferolateral mild hypokinesis.                  Trileaflet aortic valve with severe calcific stenosis. Mild                  aortic regurgitation.                  Peak aortic valve gradient 56 mm Hg, mean gradient 33 mm Hg,                  dimensionless obstructive index 0.18, estimated aortic valve                  area 0.6 cm sq.                  No mitral insufficiency.                  No pericardial effusion.                   POST-PROCEDURE FINDINGS                   Normal left ventricular systolic function, Estimated LV                  ejection fraction 55-60%. Basal inferolateral mild hypokinesis.                  Well seated aortic stent-valve TAVR. No aortic regurgitation.                  No perivalvular leak.                  Peak aortic valve gradient 12 mm Hg, mean gradient  6 mm Hg,                  dimensionless obstructive index 0.67, estimated aortic valve                  area 2.14 cm sq.                  Mild mitral insufficiency.                   No pericardial effusion. IMPRESSIONS  1. Left ventricular ejection fraction, by estimation, is 55 to 60%. The left ventricle has normal function. The left ventricle demonstrates regional wall motion abnormalities (see scoring diagram/findings for description). There is mild concentric left ventricular hypertrophy. Left ventricular diastolic function could not be evaluated.  2. Right ventricular systolic function is normal. Tricuspid regurgitation signal is inadequate for assessing PA pressure.  3. Left atrial size was mildly dilated.  4. The mitral valve is normal in structure. Mild mitral valve regurgitation.  5. The aortic valve has been repaired/replaced. Aortic valve regurgitation is not visualized. Severe aortic valve stenosis. 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 Procedure Date: 08/15/2019. Echo findings  are consistent with normal structure and function of the aortic valve prosthesis. FINDINGS  Left Ventricle: Left ventricular ejection fraction, by estimation, is 55 to 60%. The left ventricle has normal function. The left ventricle demonstrates regional wall motion abnormalities. The left ventricular internal cavity size was normal in size. There is mild concentric left ventricular hypertrophy.  LV Wall Scoring: The basal inferolateral segment and basal inferior segment are hypokinetic. Right Ventricle: No increase in right ventricular wall thickness. Right ventricular systolic function is normal. Tricuspid regurgitation signal is inadequate for assessing PA pressure. Left Atrium: Left atrial size was mildly dilated. Right Atrium: Right atrial size was normal in size. Pericardium: There is no evidence of pericardial effusion. Mitral Valve: The mitral valve is normal in structure. There is mild thickening of the mitral valve leaflet(s). Mild mitral valve regurgitation, with posteriorly-directed jet. Tricuspid Valve: The tricuspid valve is normal in  structure. Aortic Valve: The aortic valve has been repaired/replaced.. There is severe thickening and severe calcifcation of the aortic valve. Aortic valve regurgitation is not visualized. Severe aortic stenosis is present. There is severe thickening of the aortic valve. There is severe calcifcation of the aortic valve. Aortic valve mean gradient measures 6.0 mmHg. Aortic valve peak gradient measures 12.4 mmHg. Aortic valve area, by VTI measures 2.23 cm. 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 Procedure Date: 08/15/2019. Echo findings are consistent with normal structure and function of the aortic valve prosthesis. Pulmonic Valve: The pulmonic valve was not assessed. Aorta: The aortic root and ascending aorta are structurally normal, with no evidence of dilitation. IAS/Shunts: The interatrial septum was not assessed.  LEFT VENTRICLE PLAX 2D LVOT diam:     2.07 cm LV SV:         90 LV SV Index:   43 LVOT Area:     3.36 cm  AORTIC VALVE AV Area (Vmax):    2.08 cm AV Area (Vmean):   2.08 cm AV Area (VTI):     2.23 cm AV Vmax:           176.00 cm/s AV Vmean:          116.000 cm/s AV VTI:  0.404 m AV Peak Grad:      12.4 mmHg AV Mean Grad:      6.0 mmHg LVOT Vmax:         109.00 cm/s LVOT Vmean:        71.800 cm/s LVOT VTI:          0.269 m LVOT/AV VTI ratio: 0.67  SHUNTS Systemic VTI:  0.27 m Systemic Diam: 2.07 cm Sanda Klein MD Electronically signed by Sanda Klein MD Signature Date/Time: 08/15/2019/10:06:02 AM    Final    Structural Heart Procedure  Result Date: 08/15/2019 See surgical note for result.  Disposition   Pt is being discharged home today in stable condition.  Follow-up Plans & Appointments     Contact information for follow-up providers    Eileen Stanford, PA-C Follow up.   Specialties: Cardiology, Radiology Why: Keep your followup scheduled with Nell Range PA-C on September 27, 2019 at 4:00 PM (Arrive by 3:45  PM). Since you had your heart ultrasound done in the hospital, you do not need to come at 3pm this day for the ultrasound - just your 4pm appointment. Contact information: [address] [place] [address] [phone]            Contact information for after-discharge care    Destination    [facility] .   Service: Skilled Nursing Contact information: [place] [phone]                 Discharge Instructions    Diet - low sodium heart healthy   Complete by: As directed    Carb modified (diabetic)   Discharge instructions   Complete by: As directed    Please notify cardiologist if his blood pressure remains consistently higher than 010 systolic.   Increase activity slowly   Complete by: As directed    No driving, lifting over 10lb or sexual activity until cleared by your provider at your follow-up appointment.   If you are going to have an upcoming dental procedure, please contact our office as you will require antibiotics ahead of time to prevent infection on your heart valve.      Discharge Medications   Allergies as of 09/12/2019      Reactions   Macrodantin [nitrofurantoin] Other (See Comments)   "blocked kidneys"    Sulfa Antibiotics Rash   Lisinopril Cough   Penicillins    08/30/19- Patient can't remember, was 35+ years ago. OK with trying cephalexin in hospital      Medication List    STOP taking these medications   fosfomycin 3 g Pack Commonly known as: MONUROL   Lantus SoloStar 100 UNIT/ML Solostar Pen Generic drug: insulin glargine   meloxicam 15 MG tablet Commonly known as: MOBIC   midodrine 5 MG tablet Commonly known as: PROAMATINE   pyridostigmine 60 MG tablet Commonly known as: MESTINON   sitaGLIPtin 100 MG tablet Commonly known as: Januvia     TAKE these medications   acetaminophen 500 MG tablet Commonly known as: TYLENOL Take  1-2 tablets (500-1,000 mg total) by mouth every 6 (six) hours as needed for mild pain or moderate pain. What changed:   how much to take  when to take this  reasons to take this  additional instructions   amLODipine 5 MG tablet Commonly known as: NORVASC Take 1 tablet (5 mg total) by mouth daily. Start taking on: September 13, 2019  aspirin 81 MG chewable tablet Chew 1 tablet (81 mg total) by mouth daily.   carboxymethylcellulose 0.5 % Soln Commonly known as: REFRESH PLUS Place 1 drop into both eyes 2 (two) times daily as needed (dry eyes).   clonazePAM 0.5 MG tablet Commonly known as: KLONOPIN Take 1 tablet (0.5 mg total) by mouth 4 (four) times daily.   Coenzyme Q10 200 MG capsule Take 200 mg by mouth daily.   Dermacloud Crea Apply 1 application topically in the morning, at noon, and at bedtime.   insulin aspart 100 UNIT/ML injection Commonly known as: novoLOG Inject 0-9 Units into the skin 3 (three) times daily with meals. Correction coverage: Sensitive CBG < 70: Implement Hypoglycemia Standing Orders CBG 70 - 120: 0 units CBG 121 - 150: 1 unit CBG 151 - 200: 2 units CBG 201 - 250: 3 units CBG 251 - 300: 5 units CBG 301 - 350: 7 units CBG 351 - 400: 9 units CBG > 400: call MD What changed:   how much to take  when to take this  additional instructions   insulin aspart 100 UNIT/ML injection Commonly known as: novoLOG Inject 3 Units into the skin 3 (three) times daily with meals. If patient eats >50% of meals. What changed: You were already taking a medication with the same name, and this prescription was added. Make sure you understand how and when to take each.   isosorbide mononitrate 60 MG 24 hr tablet Commonly known as: IMDUR Take 1 tablet (60 mg total) by mouth daily. Start taking on: September 13, 2019   metoprolol tartrate 25 MG tablet Commonly known as: LOPRESSOR Take 1 tablet (25 mg total) by mouth 2 (two) times daily. What changed: how much to  take   nitroGLYCERIN 0.4 MG SL tablet Commonly known as: NITROSTAT Place 1 tablet (0.4 mg total) under the tongue every 5 (five) minutes as needed for chest pain (up to 3 doses). Do not give if blood pressure is low (less than 540 systolic).   glucose blood test strip 1 each by Other route as needed.   ONE TOUCH ULTRA TEST test strip Generic drug: glucose blood 1 each by Other route daily.   OneTouch Delica Plus [id number] Misc 1 each by Other route daily.   rosuvastatin 20 MG tablet Commonly known as: CRESTOR Take 1 tablet (20 mg total) by mouth daily. What changed:   medication strength  how much to take   Trilipix 135 MG capsule Generic drug: Choline Fenofibrate Take 135 mg by mouth daily.          Outstanding Labs/Studies   Keep TAVR f/u as planned If the patient is tolerating statin at time of follow-up appointment, would consider rechecking liver function/lipid panel in 6-8 weeks.  Duration of Discharge Encounter   Greater than 30 minutes including physician time.  Signed, Charlie Pitter, PA-C 09/12/2019, 2:30 PM

## 2019-09-11 NOTE — Progress Notes (Addendum)
Progress Note  Patient Name: Alexis Griffith Date of Encounter: 09/11/2019  Primary Cardiologist: Mertie Moores, MD  Subjective   Still feeling a mild discomfort across his chest, but generally improved from admission. Feeling discouraged about recent issues.  BP appears variable - 102/68 around 4am, rechecked at 161/67 at 848am. (Orthostatics not yet done today but yesterday demonstrated a drop from 118/66 down to 72/55 standing.)  Inpatient Medications    Scheduled Meds: . amLODipine  5 mg Oral Daily  . aspirin EC  81 mg Oral Daily  . Chlorhexidine Gluconate Cloth  6 each Topical Daily  . clonazePAM  0.5 mg Oral QID  . insulin aspart  0-15 Units Subcutaneous TID WC  . insulin aspart  0-5 Units Subcutaneous QHS  . isosorbide mononitrate  60 mg Oral Daily  . metoprolol tartrate  25 mg Oral BID  . pyridostigmine  30 mg Oral BID  . rosuvastatin  20 mg Oral q1800   Continuous Infusions:  PRN Meds: acetaminophen, alum & mag hydroxide-simeth, hydrALAZINE, nitroGLYCERIN, ondansetron (ZOFRAN) IV   Vital Signs    Vitals:   09/10/19 1927 09/11/19 0006 09/11/19 0409 09/11/19 0848  BP: (!) 134/59 (!) 106/53 102/68 (!) 161/67  Pulse:   65 83  Resp:   18   Temp: 98.5 F (36.9 C) 98 F (36.7 C) 98 F (36.7 C)   TempSrc: Oral Oral Oral   SpO2:      Weight:   85 kg   Height:        Intake/Output Summary (Last 24 hours) at 09/11/2019 1002 Last data filed at 09/11/2019 0300 Gross per 24 hour  Intake 1100 ml  Output 2500 ml  Net -1400 ml   Last 3 Weights 09/11/2019 09/10/2019 09/08/2019  Weight (lbs) 187 lb 6.3 oz 188 lb 7.9 oz 193 lb 5.5 oz  Weight (kg) 85 kg 85.5 kg 87.7 kg     Telemetry    NSR with occasional PACs/PVCs - Personally Reviewed  ECG    Yesterday NSR 71bpm LVH with NSST changes, occasional PACs  - Personally Reviewed  Physical Exam   GEN: No acute distress.  HEENT: Normocephalic, atraumatic, sclera non-icteric. Neck: No JVD or bruits. Cardiac: RRR no  murmurs, rubs, or gallops.  Radials/DP/PT 1+ and equal bilaterally.  Respiratory: Clear to auscultation bilaterally. Breathing is unlabored. GI: Soft, nontender, non-distended, BS +x 4. MS: no deformity. Extremities: No clubbing or cyanosis. No edema. Distal pedal pulses are 2+ and equal bilaterally. Neuro:  AAOx3. Follows commands. Psych:  Responds to questions appropriately with a normal affect.  Labs    High Sensitivity Troponin:   Recent Labs  Lab 09/08/19 2349 09/09/19 0308 09/09/19 0541 09/09/19 1446 09/09/19 1659  TROPONINIHS 1,311* 2,757* 3,124* 1,189* 911*      Cardiac EnzymesNo results for input(s): TROPONINI in the last 168 hours. No results for input(s): TROPIPOC in the last 168 hours.   Chemistry Recent Labs  Lab 09/08/19 2227 09/09/19 0308  NA 133* 137  K 3.4* 3.8  CL 101 103  CO2 23 22  GLUCOSE 218* 141*  BUN 12 11  CREATININE 0.79 0.73  CALCIUM 9.4 9.4  PROT 6.3*  --   ALBUMIN 2.9*  --   AST 22  --   ALT 20  --   ALKPHOS 50  --   BILITOT 0.4  --   GFRNONAA >60 >60  GFRAA >60 >60  ANIONGAP 9 12     Hematology Recent Labs  Lab 09/09/19  0308 09/10/19 0402 09/11/19 0321  WBC 7.7 7.9 7.9  RBC 4.76 4.63 4.37  HGB 12.9* 12.7* 11.9*  HCT 38.8* 38.6* 36.5*  MCV 81.5 83.4 83.5  MCH 27.1 27.4 27.2  MCHC 33.2 32.9 32.6  RDW 13.1 13.4 13.2  PLT 224 191 174    BNP Recent Labs  Lab 09/08/19 2139 09/09/19 0308  BNP 222.2* 326.2*     DDimer No results for input(s): DDIMER in the last 168 hours.   Radiology    No results found.  Cardiac Studies   LHC 07/2019  Ost LM lesion is 30% stenosed.  Prox LAD to Mid LAD lesion is 70% stenosed with 70% stenosed side branch in 2nd Diag. Both vessels positive by CT FFR  Mid LAD to Dist LAD lesion is 40% stenosed. Dist LAD lesion is 75% stenosed.  Prox Cx lesion is 45% stenosed. Prox Cx to Mid Cx lesion is 75% stenosed. RFR positive is 0.87  Mid Cx lesion is 5% stenosed with 50% stenosed side  branch in 3rd Mrg. No significant drop in RFR reading beyond this lesion.  Ost RCA to Dist RCA lesion is 100% stenosed. Significant left to right collaterals fill the PDA and PL system.  RPDA lesion is 50% stenosed.  --------------------------------------  The left ventricular systolic function is normal. The left ventricular ejection fraction is 55-65% by visual estimate. Regional wall motion knowledged and inferior wall.  LV end diastolic pressure is moderately elevated.  There is ~SEVERE AORTIC VALVE STENOSIS.    SUMMARY  Severe three-vessel disease: 100% CTO of ostial RCA (left to right collaterals fill PDA and PL), long 70% calcified proximal to mid LAD (positive by CT FFR), segmental ~75% proximal-mid LCx (positive by RFR)  Borderline SEVERE AORTIC STENOSIS-mean gradient estimated 37 mmHg by pullback.  Normal LVEF with basal inferior hypokinesis seen on echocardiogram and LV gram.   Plan will be to consult CVTS/Cardiology Valve Clinic to consider options between two-vessel PCI and TAVR versus CABG/AVR. He is not having resting chest pain, will discharge home today on Imdur pending Valve Clinic evaluation.Glenetta Hew, MD   Patient Profile     79 y.o. male with recent TAVR 08/15/19 with complex post-procedural course, DM, HTN, CVA, TIA, sleep apnea, multivessel CAD as above, chronic diastolic CHF, recently diagnosed renal cell carcinoma, pulmonary nodules who was readmitted with chest pain. He was recently managed by the structural heart team. (Prior to TAVR he was consider for surgical CABG and AVR however was considered too high risk for possible complication after surgery and because he did not have typical angina medical management was determined.) He underwent TAVR 08/15/19, with prolonged hospital course due to issues with acute urinary retention,  Hematuria, AKI, epididymoorchitis, MRI with 60mm infarct felt to be incidental and procedural related, orthostatic  hypotension with recurrent syncope requiring midodrine, Mestinon, and short-term florinef. He was readmitted 09/08/2019 with severe chest pain at rest associated with hypertension. He reported anxiety for multiple reasons including the prospect of a nephrectomy for his renal cell carcinoma and difficulties with renewal of his insurance approval for rehabilitation.    Assessment & Plan    1. NSTEMI in context of known multivessel CAD - HS trop 190->1311->2750->3124-> 911. In the settings of known obstructive multivessel disease and severe hypertension, troponin elevation slightly higher than what we would expect with demand ischemia only. Prior notes indicate plan to focus on good BP control. If he continues to have symptoms we will discuss with interventional  team about potential intervention to LAD - completed 48 hours of heparin - continue ASA and statin - newly started antianginals include amlodipine and Imdur with titration of metoprolol - still significantly orthostatic yesterday, will request repeat today - limited echo pending - Dr. Angelena Form to see today - will discuss plan  2. Accelerated HTN with recent issues with hypotension - managing in context of the above - midodrine discontinued, continued on Mestinon  3. Severe AS s/p recent TAVR - has been on ASA alone given hematuria - limited echo pending  4. Renal cell carcinoma with recent urinary retention and hematuria and UTI/epididymoorchitis - still with foley cath in place from previous admission for urinary retention. Received last dose of fosfomycin here. He was supposed to see Dr. Tresa Moore at [facility] today as outpatient for discussion about nephrectomy as well as voiding trial - creatinine has remained normal this admission - consider inpatient urology consult; will review plans with Dr. Angelena Form  5. DM type 2 - was on Lantus at home, held here due to variable hospital course. - continue SSI - will need to discuss with DM  coordinator prior to discharge ideal regimen for basal insulin  6. Anemia - in context of recent issues above - Hgb trend 12.7 - 13.1 - 12.9 - 12.7 - 11.9 - continue to monitor given downtrend - foley bag does not appear to have gross hematuria in it  7. Carotid artery disease - MRI last admission showed 4 mm acute infarct left medial frontalcortex, which was felt to be incidentaland procedural related.CTA of the head and neck show moderate 65 to 75% bilateral ICA stenoses  - will require continued surveillance and outpatient follow-up  8. Hyperlipidemia with goal LDL <70 given CAD - rosuvastatin titrated this admission - If the patient is tolerating statin at time of follow-up appointment, would consider rechecking liver function/lipid panel in 6 weeks   For questions or updates, please contact [place] Please consult [email or web address] for contact info under Cardiology/STEMI.  Signed, Charlie Pitter, PA-C 09/11/2019, 10:02 AM    I have personally seen and examined this patient. I agree with the assessment and plan as outlined above.  Mr. Nunnery has a complex history with recent TAVR, DM, HTN, CVA, OSA, multi-vessel CAD and recently diagnosed renal cell carcinoma admitted with chest pain. TAVR was on 08/15/19. He had a prolonged postoperative course following TAVR due to urinary retention, hematuria, AKI. Also with orthostatic hypotension. He was discharged with an indwelling foley catheter with plans to see urology today to discuss his catheter and planning for resection of RCC.  I reviewed his cath films. He has moderate LAD and Circumflex disease but no tight lesions. His RCA is occluded and fills from left to right collaterals. He is not felt to be a candidate for PCI currently with hematuria, anemia and need for renal cell carcinoma resection.  Troponin up to 3124 this admission. Medical therapy for CAD now includes metoprolol, Norvasc and Imdur. Chest pain is better today. Still with  an indwelling foley catheter. H/H stable.   -Continue current therapy for CAD including metoprolol, Norvasc, Imdur, ASA and statin -No plans for cardiac catheterization or PCI at this time -Will discuss case with Urology and see if we can come up with a plan for his renal cell carcinoma and foley/hematuria. He could be discharged home from a cardiac perspective.  -continue Mestinon for orthostatic hypotension  [name] 09/11/2019 10:54 AM

## 2019-09-11 NOTE — TOC Initial Note (Signed)
Transition of Care [facility]) - Initial/Assessment Note    Patient Details  Name: Alexis Griffith MRN: [id number] Date of Birth: [birth year]  [facility]) CM/SW Contact:    Trula Ore, [place] Phone Number: 09/11/2019, 4:14 PM  Clinical Narrative:                  CSW spoke with patient at bedside. Patient came from [place]. Patient is agreeable to SNF placement. Patient is agreeable to CSW faxing out initial referral to [place] area. Patient gave CSW permission to discuss his care with [name] his spouse.  Pending bed offers. CSW will start insurance authorization for patient.  CSW will continue to follow.  Expected Discharge Plan: Skilled Nursing Facility Barriers to Discharge: Continued Medical Work up   Patient Goals and CMS Choice Patient states their goals for this hospitalization and ongoing recovery are:: to go to SNF CMS Medicare.gov Compare Post Acute Care list provided to:: Patient Choice offered to / list presented to : Patient  Expected Discharge Plan and Services Expected Discharge Plan: [place]                                              Prior Living Arrangements/Services   Lives with:: Self, Spouse Patient language and need for interpreter reviewed:: Yes Do you feel safe going back to the place where you live?: No   SNF  Need for Family Participation in Patient Care: Yes (Comment) Care giver support system in place?: Yes (comment)   Criminal Activity/Legal Involvement Pertinent to Current Situation/Hospitalization: No - Comment as needed  Activities of Daily Living Home Assistive Devices/Equipment: Environmental consultant (specify type), Wheelchair ADL Screening (condition at time of admission) Patient's cognitive ability adequate to safely complete daily activities?: Yes Is the patient deaf or have difficulty hearing?: No Does the patient have difficulty seeing, even when wearing glasses/contacts?: No Does the patient have  difficulty concentrating, remembering, or making decisions?: No Patient able to express need for assistance with ADLs?: Yes Does the patient have difficulty dressing or bathing?: Yes Independently performs ADLs?: Yes (appropriate for developmental age) Does the patient have difficulty walking or climbing stairs?: No Weakness of Legs: Right Weakness of Arms/Hands: None  Permission Sought/Granted Permission sought to share information with : Case Manager, Family Supports, Customer service manager    Share Information with NAME: [name]  Permission granted to share info w AGENCY: SNF  Permission granted to share info w Relationship: Spouse  Permission granted to share info w Contact Information: [name]  [phone]  Emotional Assessment Appearance:: Appears stated age Attitude/Demeanor/Rapport: Gracious Affect (typically observed): Calm Orientation: :  (WDL) Alcohol / Substance Use: Not Applicable Psych Involvement: No (comment)  Admission diagnosis:  Shortness of breath [R06.02] Elevated troponin [R77.8] NSTEMI (non-ST elevated myocardial infarction) ([place]) [I21.4] Chest pain, unspecified type [R07.9] Patient Active Problem List   Diagnosis Date Noted  . Orthostatic hypotension 09/11/2019  . Urinary retention 09/11/2019  . Mild anemia 09/11/2019  . Hypomagnesemia 09/11/2019  . NSTEMI (non-ST elevated myocardial infarction) ([place]) 09/09/2019  . Elevated troponin   . Chest pain   . Hypertensive urgency   . Acute on chronic diastolic heart failure ([place]) 08/15/2019  . 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 Diabetes mellitus type 2 in nonobese (HCC)   . Severe aortic stenosis   . Sleep apnea   .  Renal mass   . Coronary artery disease involving native coronary artery of native heart with angina pectoris ([place]) 07/04/2019  . 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 Excessive urination at night 06/05/2013  . TIA (transient ischemic attack) 07/27/2012  .  Giant cell arteritis ([place]) 05/17/2012  . Carotid artery disease ([place]) 08/24/2011  . PVC (premature ventricular contraction)   . Hypertension   . Hyperlipidemia LDL goal <70   . Anxiety   . Sinus bradycardia on ECG    PCP:  Tisove[place], Fransico Him, MD Pharmacy:   [facility], [place] [address] Phone: [phone] Fax: [phone]     Social Determinants of Health (SDOH) Interventions    Readmission Risk Interventions No flowsheet data found.

## 2019-09-11 NOTE — Progress Notes (Signed)
Inpatient Diabetes Program Recommendations  AACE/ADA: New Consensus Statement on Inpatient Glycemic Control (2015)  Target Ranges:  Prepandial:   less than 140 mg/dL      Peak postprandial:   less than 180 mg/dL (1-2 hours)      Critically ill patients:  140 - 180 mg/dL   Lab Results  Component Value Date   GLUCAP 167 (H) 09/11/2019   HGBA1C 6.4 (H) 08/17/2019    Review of Glycemic Control  Diabetes history: DM2 Outpatient Diabetes medications: Lantus 65 units daily + Novolog 4-10 units tid meal coverage + Januvia (not taking) Current orders for Inpatient glycemic control: Novolog moderate correction tid + hs 0-5 units  Inpatient Diabetes Program Recommendations:   Received consult regarding Lantus insulin dosing on discharge. Spoke with [facility] concerning discharge recommendations.  Novolog 3 units tid meal coverage if eats 50% Add basal insulin if CBGs consistently > 200. Novolog sensitive correction tid  Thank you, Nani Gasser. Shanyce Daris, RN, MSN, CDE  Diabetes Coordinator Inpatient Glycemic Control Team Team Pager [phone] (8am-5pm) 09/11/2019 2:15 PM

## 2019-09-11 NOTE — TOC Progression Note (Signed)
Transition of Care [facility]) - Progression Note    Patient Details  Name: Alexis Griffith MRN: [id number] Date of Birth: 03/22/1940  Transition of Care [facility]) CM/SW [place], [place] Phone Number: 09/11/2019, 4:36 PM  Clinical Narrative:     CSW started insurance authorization for patient. Insurance start date is for tomorrow 7/13. Insurance authorization reference number is [id number].  Pending bed offers. Insurance authorization is pending.  CSW will continue to follow.  Expected Discharge Plan: Skilled Nursing Facility Barriers to Discharge: Continued Medical Work up  Expected Discharge Plan and Services Expected Discharge Plan: [place]                                               Social Determinants of Health (SDOH) Interventions    Readmission Risk Interventions No flowsheet data found.

## 2019-09-11 NOTE — NC FL2 (Signed)
MEDICAID FL2 LEVEL OF CARE SCREENING TOOL     IDENTIFICATION  Patient Name: Alexis Griffith Birthdate: Dec 19, 1940 Sex: male Admission Date (Current Location): 09/08/2019  [facility] and [place] Number:  Herbalist and Address:  [facility], [place] [address] [place], [place] [address]      Provider Number: [id number]  Attending Physician Name and Address:  Sherren Mocha, MD  Relative Name and Phone Number:  [name]  [phone]    Current Level of Care: Hospital Recommended Level of Care: [place] Prior Approval Number:    Date Approved/Denied:   PASRR Number: [id number]  Discharge Plan: SNF    Current Diagnoses: Patient Active Problem List   Diagnosis Date Noted  . Orthostatic hypotension 09/11/2019  . Urinary retention 09/11/2019  . Mild anemia 09/11/2019  . Hypomagnesemia 09/11/2019  . NSTEMI (non-ST elevated myocardial infarction) ([place]) 09/09/2019  . Elevated troponin   . Chest pain   . Hypertensive urgency   . Acute on chronic diastolic heart failure ([place]) 08/15/2019  . 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 Diabetes mellitus type 2 in nonobese (HCC)   . Severe aortic stenosis   . Sleep apnea   . Renal mass   . Coronary artery disease involving native coronary artery of native heart with angina pectoris ([place]) 07/04/2019  . 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 Excessive urination at night 06/05/2013  . TIA (transient ischemic attack) 07/27/2012  . Giant cell arteritis ([place]) 05/17/2012  . Carotid artery disease ([place]) 08/24/2011  . PVC (premature ventricular contraction)   . Hypertension   . Hyperlipidemia LDL goal <70   . Anxiety   . Sinus bradycardia on ECG     Orientation RESPIRATION BLADDER Height & Weight      (WDL)  Normal Continent, Indwelling catheter (Indwelling foley catheter) Weight: 187 lb 6.3 oz (85 kg) Height:  5\' 10"  (177.8 cm)  BEHAVIORAL SYMPTOMS/MOOD  NEUROLOGICAL BOWEL NUTRITION STATUS      Continent Diet (See Discharge Summary)  AMBULATORY STATUS COMMUNICATION OF NEEDS Skin   Limited Assist Verbally Other (Comment) (Apropriate for ethnicity,Dry, MASD, mositure penis scrotum right;left;mid;cleansed Foam barrier cream non-tenting)                       Personal Care Assistance Level of Assistance  Bathing, Feeding, Dressing Bathing Assistance: Limited assistance Feeding assistance: Independent (Able to feed self;needs help set up;Carb modified cardiac) Dressing Assistance: Limited assistance     Functional Limitations Info  Sight, Hearing, Speech Sight Info: Adequate Hearing Info: Adequate Speech Info: Adequate    SPECIAL CARE FACTORS FREQUENCY  PT (By licensed PT), OT (By licensed OT)     PT Frequency: 5x min weekly OT Frequency: 5x min weekly            Contractures Contractures Info: Not present    Additional Factors Info  Code Status, Allergies Code Status Info: FULL Allergies Info: Macrodantin,Sulfa Antibiotics,Lisinopril,Penicillins           Current Medications (09/11/2019):  This is the current hospital active medication list Current Facility-Administered Medications  Medication Dose Route Frequency Provider Last Rate Last Admin  . acetaminophen (TYLENOL) tablet 650 mg  650 mg Oral Q4H PRN Nipp, Carriel T, MD      . alum & mag hydroxide-simeth (MAALOX/MYLANTA) 200-200-20 MG/5ML suspension 30 mL  30 mL Oral Q4H PRN Sherren Mocha, MD   30 mL at 09/10/19 1512  . amLODipine (  NORVASC) tablet 5 mg  5 mg Oral Daily Eileen Stanford, PA-C   5 mg at 09/11/19 0848  . aspirin EC tablet 81 mg  81 mg Oral Daily Nipp, Carriel T, MD   81 mg at 09/11/19 0847  . Chlorhexidine Gluconate Cloth 2 % PADS 6 each  6 each Topical Daily Sherren Mocha, MD   6 each at 09/11/19 1000  . clonazePAM (KLONOPIN) tablet 0.5 mg  0.5 mg Oral QID Nipp, Carriel T, MD   0.5 mg at 09/11/19 1534  . hydrALAZINE (APRESOLINE) tablet 25 mg   25 mg Oral Q6H PRN Nipp, Carriel T, MD   25 mg at 09/09/19 1520  . insulin aspart (novoLOG) injection 0-9 Units  0-9 Units Subcutaneous TID WC Dunn, Dayna N, PA-C      . insulin aspart (novoLOG) injection 3 Units  3 Units Subcutaneous TID WC Dunn, Dayna N, PA-C      . isosorbide mononitrate (IMDUR) 24 hr tablet 60 mg  60 mg Oral Daily Dorothy Spark, MD   60 mg at 09/11/19 0848  . metoprolol tartrate (LOPRESSOR) tablet 25 mg  25 mg Oral BID Dorothy Spark, MD   25 mg at 09/11/19 0848  . nitroGLYCERIN (NITROSTAT) SL tablet 0.4 mg  0.4 mg Sublingual Q5 Min x 3 PRN Nipp, Carriel T, MD      . ondansetron (ZOFRAN) injection 4 mg  4 mg Intravenous Q6H PRN Nipp, Carriel T, MD      . pyridostigmine (MESTINON) tablet 30 mg  30 mg Oral BID Nipp, Carriel T, MD   30 mg at 09/11/19 0849  . rosuvastatin (CRESTOR) tablet 20 mg  20 mg Oral q1800 Nipp, Carriel T, MD   20 mg at 09/10/19 1620     Discharge Medications: Please see discharge summary for a list of discharge medications.  Relevant Imaging Results:  Relevant Lab Results:   Additional Information [id number]  Trula Ore, LCSWA

## 2019-09-11 NOTE — Progress Notes (Signed)
PT re-evaluated patient this afternoon. Previous notes raised question of SNF vs home with home health. They are recommending SNF. Patient preferred to go to alternative facility as he does not want to go back to [facility]. Therefore, bed search under way with help of social work. Orthostatic VS were stable this afternoon by PT note. Have asked nurse to recheck BP this evening - if SBP remains elevated, may need to titrate amlodipine to 10mg  daily this evening. She will page if this is the case.  Ma Munoz PA-C

## 2019-09-11 NOTE — Progress Notes (Signed)
Physical Therapy Treatment Patient Details Name: Alexis Griffith MRN: [id number] DOB: 01-28-1941 Today's Date: 09/11/2019    History of Present Illness Pt is a 79 y/o male with hx of HTN, HLD, DM2, OSA, hx of CVA, multivessel CAD, renal cell carcinoma, severe AS s/p TAVR 1/44/81 with complicated post procedural course with orthostatic hypotension and urologic issues.  Presenting to ED on 7/10 with severe hypertension and chest pain found to have NSTEMI.     PT Comments    Pt supine in bed agreeable to therapy, but anxious that he has lost all the gains in mobility he made while at SNF. Focus of session orthostatic hypotension data collection and increased mobility. Pt is limited in safe mobility by decreased strength and endurance. Pt is min A for bed mobility, min A for transfers and minA progressing to min guard for ambulation with RW. Pt requires return to SNF level rehab to improve his strength and endurance to ultimately be safe in his home environment. PT will continue to follow acutely. Orthostatic BPs  Supine 173/57  Sitting 162/68  Standing 147/58  Standing after 3 min  154/52      Follow Up Recommendations  SNF     Equipment Recommendations  None recommended by PT    Recommendations for Other Services OT consult     Precautions / Restrictions Precautions Precautions: Fall Precaution Comments: Watch BP and HR Restrictions Weight Bearing Restrictions: No    Mobility  Bed Mobility Overal bed mobility: Needs Assistance Bed Mobility: Supine to Sit     Supine to sit: Min assist;HOB elevated Sit to supine: Modified independent (Device/Increase time)   General bed mobility comments: min A for bringing trunk to upright, increased time and effort to get back in bed and scoot himself up   Transfers Overall transfer level: Needs assistance Equipment used: Rolling walker (2 wheeled) Transfers: Sit to/from Stand Sit to Stand: Min assist         General transfer  comment: min A for steadying with rolling walker, vc for hand placement, good power up   Ambulation/Gait Ambulation/Gait assistance: Min guard;Min assist Gait Distance (Feet): 100 Feet (2x100) Assistive device: Rolling walker (2 wheeled) Gait Pattern/deviations: Step-through pattern;Decreased stride length;Trunk flexed Gait velocity: decreased Gait velocity interpretation: <1.8 ft/sec, indicate of risk for recurrent falls General Gait Details: min A for steadying progressing to min guard for safety with ambulation, 1x standing rest break to check vitals       Balance Overall balance assessment: Needs assistance Sitting-balance support: Feet supported;No upper extremity supported Sitting balance-Leahy Scale: Good Sitting balance - Comments: supervision   Standing balance support: No upper extremity supported Standing balance-Leahy Scale: Fair                              Cognition Arousal/Alertness: Awake/alert Behavior During Therapy: Flat affect;WFL for tasks assessed/performed;Anxious Overall Cognitive Status: Within Functional Limits for tasks assessed Area of Impairment: Problem solving                             Problem Solving: Slow processing;Requires verbal cues;Requires tactile cues General Comments: Pt is anxious about the current state of his health and was very worried about having lost all the physical gains he had made at SNF prior to being rehospitalized.          General Comments General comments (skin integrity, edema, etc.): pt continues  to have BP drop with positional change however within acceptable limits for mobility.       Pertinent Vitals/Pain Pain Assessment: No/denies pain Pain Intervention(s): Limited activity within patient's tolerance;Monitored during session;Premedicated before session           PT Goals (current goals can now be found in the care plan section) Acute Rehab PT Goals Patient Stated Goal: hopes to be  able to get home to wife PT Goal Formulation: With patient Time For Goal Achievement: 09/24/19 Potential to Achieve Goals: Good Progress towards PT goals: Progressing toward goals    Frequency    Min 3X/week      PT Plan Discharge plan needs to be updated       AM-PAC PT "6 Clicks" Mobility   Outcome Measure  Help needed turning from your back to your side while in a flat bed without using bedrails?: None Help needed moving from lying on your back to sitting on the side of a flat bed without using bedrails?: None Help needed moving to and from a bed to a chair (including a wheelchair)?: A Little Help needed standing up from a chair using your arms (e.g., wheelchair or bedside chair)?: A Little Help needed to walk in hospital room?: A Little Help needed climbing 3-5 steps with a railing? : A Little 6 Click Score: 20    End of Session Equipment Utilized During Treatment: Gait belt Activity Tolerance: Patient tolerated treatment well Patient left: with call bell/phone within reach;in bed;with bed alarm set Nurse Communication: Mobility status PT Visit Diagnosis: Other abnormalities of gait and mobility (R26.89);Muscle weakness (generalized) (M62.81);Other symptoms and signs involving the nervous system (R11.657)     Time: 1445-1520 PT Time Calculation (min) (ACUTE ONLY): 35 min  Charges:  $Therapeutic Exercise: 8-22 mins $Therapeutic Activity: 8-22 mins                     Kitti Mcclish B. Migdalia Dk PT, DPT Acute Rehabilitation Services Pager [phone] Office [phone]    [place] 09/11/2019, 3:50 PM

## 2019-09-11 NOTE — Progress Notes (Signed)
Re: urology. Discussed case via phone with Dr. Tresa Moore who patient was supposed to see as outpatient today. He has concerns about patient's candidacy for major surgery given evidence of demand ischemia this admission, may consider more conservative approach - he recommends to keep foley in place and his office will contact patient to reschedule follow-up.    Katharine Rochefort PA-C

## 2019-09-12 DIAGNOSIS — I491 Atrial premature depolarization: Secondary | ICD-10-CM

## 2019-09-12 HISTORY — DX: Atrial premature depolarization: I49.1

## 2019-09-12 LAB — ECHOCARDIOGRAM LIMITED
Height: 70 in
Weight: 3015.89 oz

## 2019-09-12 LAB — CBC
HCT: 36.6 % — ABNORMAL LOW (ref 39.0–52.0)
Hemoglobin: 12.2 g/dL — ABNORMAL LOW (ref 13.0–17.0)
MCH: 27.8 pg (ref 26.0–34.0)
MCHC: 33.3 g/dL (ref 30.0–36.0)
MCV: 83.4 fL (ref 80.0–100.0)
Platelets: 170 10*3/uL (ref 150–400)
RBC: 4.39 MIL/uL (ref 4.22–5.81)
RDW: 13.2 % (ref 11.5–15.5)
WBC: 8.7 10*3/uL (ref 4.0–10.5)
nRBC: 0 % (ref 0.0–0.2)

## 2019-09-12 LAB — BASIC METABOLIC PANEL
Anion gap: 10 (ref 5–15)
BUN: 17 mg/dL (ref 8–23)
CO2: 24 mmol/L (ref 22–32)
Calcium: 9.2 mg/dL (ref 8.9–10.3)
Chloride: 101 mmol/L (ref 98–111)
Creatinine, Ser: 1.07 mg/dL (ref 0.61–1.24)
GFR calc Af Amer: >60 mL/min (ref >60)
GFR calc non Af Amer: >60 mL/min (ref >60)
Glucose, Bld: 151 mg/dL — ABNORMAL HIGH (ref 70–99)
Potassium: 3.9 mmol/L (ref 3.5–5.1)
Sodium: 135 mmol/L (ref 135–145)

## 2019-09-12 LAB — GLUCOSE, CAPILLARY
Glucose-Capillary: 164 mg/dL — ABNORMAL HIGH (ref 70–99)
Glucose-Capillary: 220 mg/dL — ABNORMAL HIGH (ref 70–99)
Glucose-Capillary: 204 mg/dL — ABNORMAL HIGH (ref 70–99)

## 2019-09-12 LAB — SARS CORONAVIRUS 2 BY RT PCR (HOSPITAL ORDER, PERFORMED IN ~~LOC~~ HOSPITAL LAB): SARS Coronavirus 2: NEGATIVE

## 2019-09-12 MED ORDER — CLONAZEPAM 0.5 MG PO TABS: 0.5000 mg | ORAL_TABLET | Freq: Four times a day (QID) | ORAL | 0 refills | Status: DC

## 2019-09-12 MED ORDER — ROSUVASTATIN CALCIUM 20 MG PO TABS: 20.0000 mg | ORAL_TABLET | Freq: Every day | ORAL | Status: DC

## 2019-09-12 MED ORDER — METOPROLOL TARTRATE 25 MG PO TABS: 25.0000 mg | ORAL_TABLET | Freq: Two times a day (BID) | ORAL | Status: DC

## 2019-09-12 MED ORDER — ACETAMINOPHEN 500 MG PO TABS: 500.0000 mg | ORAL_TABLET | Freq: Four times a day (QID) | ORAL | Status: DC | PRN

## 2019-09-12 MED ORDER — INSULIN ASPART 100 UNIT/ML ~~LOC~~ SOLN: 0.0000 [IU] | Freq: Three times a day (TID) | SUBCUTANEOUS | Status: DC

## 2019-09-12 MED ORDER — ISOSORBIDE MONONITRATE ER 60 MG PO TB24: 60.0000 mg | ORAL_TABLET | Freq: Every day | ORAL | Status: DC

## 2019-09-12 MED ORDER — INSULIN ASPART 100 UNIT/ML ~~LOC~~ SOLN: 3.0000 [IU] | Freq: Three times a day (TID) | SUBCUTANEOUS | Status: DC

## 2019-09-12 MED ORDER — MAGNESIUM SULFATE IN D5W 1-5 GM/100ML-% IV SOLN
1.0000 g | Freq: Once | INTRAVENOUS | Status: AC
  Administered 2019-09-12: 1 g via INTRAVENOUS
  Filled 2019-09-12: qty 100

## 2019-09-12 MED ORDER — NITROGLYCERIN 0.4 MG SL SUBL: 0.4000 mg | SUBLINGUAL_TABLET | SUBLINGUAL | Status: DC | PRN

## 2019-09-12 MED ORDER — AMLODIPINE BESYLATE 5 MG PO TABS: 5.0000 mg | ORAL_TABLET | Freq: Every day | ORAL | Status: DC

## 2019-09-12 NOTE — TOC Transition Note (Signed)
Transition of Care [facility]) - CM/SW Discharge Note   Patient Details  Name: Alexis Griffith MRN: [id number] Date of Birth: 1940/04/03  Transition of Care [facility]) CM/SW Contact:  Trula Ore, [place] Phone Number: 09/12/2019, 3:23 PM   Clinical Narrative:     Patient will DC to: [facility]  Anticipated DC date: 09/12/2019  Family notified: [name]  Transport by: Corey Harold  ?  Per MD patient ready for DC to [facility] . RN, patient, patient's family, and facility notified of DC. Discharge Summary sent to facility. RN given number for report tele# [phone] RM# [phone]. DC packet on chart. Ambulance transport requested for patient.  CSW signing off.  Final next level of care: Skilled Nursing Facility Barriers to Discharge: No Barriers Identified   Patient Goals and CMS Choice Patient states their goals for this hospitalization and ongoing recovery are:: to go to SNF CMS Medicare.gov Compare Post Acute Care list provided to:: Patient Choice offered to / list presented to : Patient  Discharge Placement              Patient chooses bed at: [facility] Patient to be transferred to facility by: [place] Name of family member notified: [name] Patient and family notified of of transfer: 09/12/19  Discharge Plan and Services                                     Social Determinants of Health (SDOH) Interventions     Readmission Risk Interventions No flowsheet data found.

## 2019-09-12 NOTE — Progress Notes (Addendum)
Progress Note  Patient Name: Alexis Griffith Date of Encounter: 09/12/2019  Primary Cardiologist: Mertie Moores, MD  Subjective   Patient feels alright this morning, appears well. Reports he feels a bit "full" and needs to use the bathroom.   Orthostatics performed this AM showed a rise in blood pressure from 171/62 to 191/68 (no drop). This did show a positive orthostatic HR response from 77 lying, 94 sitting, to 103 max standing, but patient was asymptomatic.  SNF bed search underway as patient does not wish to return to [facility].  Inpatient Medications    Scheduled Meds: . amLODipine  5 mg Oral Daily  . aspirin EC  81 mg Oral Daily  . Chlorhexidine Gluconate Cloth  6 each Topical Daily  . clonazePAM  0.5 mg Oral QID  . insulin aspart  0-9 Units Subcutaneous TID WC  . insulin aspart  3 Units Subcutaneous TID WC  . isosorbide mononitrate  60 mg Oral Daily  . metoprolol tartrate  25 mg Oral BID  . pyridostigmine  30 mg Oral BID  . rosuvastatin  20 mg Oral q1800   Continuous Infusions:  PRN Meds: acetaminophen, alum & mag hydroxide-simeth, hydrALAZINE, nitroGLYCERIN, ondansetron (ZOFRAN) IV   Vital Signs    Vitals:   09/12/19 0020 09/12/19 0205 09/12/19 0801 09/12/19 0802  BP: 132/85  (!) 171/62 (!) 171/62  Pulse:   82 81  Resp: 18   15  Temp: 98 F (36.7 C) 98.7 F (37.1 C)    TempSrc: Oral Oral    SpO2: 94%   93%  Weight:  81.6 kg    Height:        Intake/Output Summary (Last 24 hours) at 09/12/2019 0819 Last data filed at 09/12/2019 0713 Gross per 24 hour  Intake 240 ml  Output 5350 ml  Net -5110 ml   Last 3 Weights 09/12/2019 09/11/2019 09/10/2019  Weight (lbs) 179 lb 14.4 oz 187 lb 6.3 oz 188 lb 7.9 oz  Weight (kg) 81.602 kg 85 kg 85.5 kg     Telemetry    NSR with frequent PACs, occasional PVCs - Personally Reviewed   Physical Exam   GEN: No acute distress.  HEENT: Normocephalic, atraumatic, sclera non-icteric. Neck: No JVD or bruits. Cardiac:  RRR no murmurs, rubs, or gallops.  Radials/DP/PT 1+ and equal bilaterally.  Respiratory: Clear to auscultation bilaterally. Breathing is unlabored. GI: Soft, nontender, non-distended, BS +x 4. MS: no deformity. Extremities: No clubbing or cyanosis. No edema. Distal pedal pulses are 2+ and equal bilaterally. Neuro:  AAOx3. Follows commands. Psych:  Responds to questions appropriately with a normal affect.  Labs    High Sensitivity Troponin:   Recent Labs  Lab 09/08/19 2349 09/09/19 0308 09/09/19 0541 09/09/19 1446 09/09/19 1659  TROPONINIHS 1,311* 2,757* 3,124* 1,189* 911*      Cardiac EnzymesNo results for input(s): TROPONINI in the last 168 hours. No results for input(s): TROPIPOC in the last 168 hours.   Chemistry Recent Labs  Lab 09/08/19 2227 09/09/19 0308 09/12/19 0406  NA 133* 137 135  K 3.4* 3.8 3.9  CL 101 103 101  CO2 23 22 24   GLUCOSE 218* 141* 151*  BUN 12 11 17   CREATININE 0.79 0.73 1.07  CALCIUM 9.4 9.4 9.2  PROT 6.3*  --   --   ALBUMIN 2.9*  --   --   AST 22  --   --   ALT 20  --   --   ALKPHOS 50  --   --  BILITOT 0.4  --   --   GFRNONAA >60 >60 >60  GFRAA >60 >60 >60  ANIONGAP 9 12 10      Hematology Recent Labs  Lab 09/10/19 0402 09/10/19 0402 09/11/19 0321 09/11/19 1427 09/12/19 0406  WBC 7.9  --  7.9  --  8.7  RBC 4.63  --  4.37  --  4.39  HGB 12.7*   < > 11.9* 12.2* 12.2*  HCT 38.6*   < > 36.5* 37.7* 36.6*  MCV 83.4  --  83.5  --  83.4  MCH 27.4  --  27.2  --  27.8  MCHC 32.9  --  32.6  --  33.3  RDW 13.4  --  13.2  --  13.2  PLT 191  --  174  --  170   < > = values in this interval not displayed.    BNP Recent Labs  Lab 09/08/19 2139 09/09/19 0308  BNP 222.2* 326.2*     DDimer No results for input(s): DDIMER in the last 168 hours.   Radiology    No results found.  Cardiac Studies   LHC 07/2019  Ost LM lesion is 30% stenosed.  Prox LAD to Mid LAD lesion is 70% stenosed with 70% stenosed side branch in 2nd  Diag. Both vessels positive by CT FFR  Mid LAD to Dist LAD lesion is 40% stenosed. Dist LAD lesion is 75% stenosed.  Prox Cx lesion is 45% stenosed. Prox Cx to Mid Cx lesion is 75% stenosed. RFR positive is 0.87  Mid Cx lesion is 5% stenosed with 50% stenosed side branch in 3rd Mrg. No significant drop in RFR reading beyond this lesion.  Ost RCA to Dist RCA lesion is 100% stenosed. Significant left to right collaterals fill the PDA and PL system.  RPDA lesion is 50% stenosed.  --------------------------------------  The left ventricular systolic function is normal. The left ventricular ejection fraction is 55-65% by visual estimate. Regional wall motion knowledged and inferior wall.  LV end diastolic pressure is moderately elevated.  There is ~SEVERE AORTIC VALVE STENOSIS.   SUMMARY  Severe three-vessel disease: 100% CTO of ostial RCA (left to right collaterals fill PDA and PL), long 70% calcified proximal to mid LAD (positive by CT FFR), segmental ~75% proximal-mid LCx (positive by RFR)  Borderline SEVERE AORTIC STENOSIS-mean gradient estimated 37 mmHg by pullback.  Normal LVEF with basal inferior hypokinesis seen on echocardiogram and LV gram.   Plan will be to consult CVTS/Cardiology Valve Clinic to consider options between two-vessel PCI and TAVR versus CABG/AVR. He is not having resting chest pain, will discharge home today on Imdur pending Valve Clinic evaluation.Alexis Hew, MD  Patient Profile     79 y.o. male with recent TAVR 08/15/19 with complex post-procedural course, DM, HTN, CVA, TIA, sleep apnea, multivessel CAD as above, chronic diastolic CHF, recently diagnosed renal cell carcinoma, pulmonary nodules who was readmitted with chest pain. He was recently managed by the structural heart team. (Prior to TAVR he was consider for surgical CABG and AVR however was considered too high risk for possible complication after surgery and because he did not have  typical angina medical management was determined.) He underwent TAVR 08/15/19, with prolonged hospital course due to issues with acute urinary retention,  Hematuria, AKI, epididymoorchitis, MRI with 2mm infarct felt to be incidental and procedural related, orthostatic hypotension with recurrent syncope requiring midodrine, Mestinon, and short-term florinef. He was readmitted 09/08/2019 with severe chest pain at  rest associated with hypertension. He reported anxiety for multiple reasons including the prospect of a nephrectomy for his renal cell carcinoma and difficulties with renewal of his insurance approval for rehabilitation.   Assessment & Plan    1. NSTEMI in context of known multivessel CAD- HS trop190->1311->2750->3124-> 911.In the setting of known multivessel coronary disease and severe hypertension. Troponin elevation slightly higher than what we would expect with demand ischemia only.  - case was reviewed by Dr. Angelena Form and Dr. Burt Knack who recommended medical therapy. Interventional approach is not preferred given that he would have to be on DAPT post-PCI - not a good option as he had had recent hematuria requiring cessation of Plavix - he completed 48 hours of heparin while inpatient - he will continue ASA - statin titrated as below - if the patient is tolerating statin at time of follow-up appointment, would consider rechecking liver function/lipid panel in 6-8 weeks. - newly started antianginals include amlodipine and Imdur with titration of metoprolol - careful titration enacted given concomitant orthostatic drop in BP - limited echo 09/10/19 still not read for unclear reasons, will reach out to cardmaster again to help   2. Accelerated HTN with recent issues with orthostatic hypotension, presenting with elevated blood pressure - managing in context of the above - midodrine discontinued due to high blood pressure - no further drops in blood pressure with standing - he does have a rise  in HR but is asymptomatic so may be due to deconditioning- - will stop mestinon today - follow post-anti-hypertensive blood pressures for response - consideration can be given to increasing amlodipine further  3. Severe AS s/p recent TAVR - has been on ASA alone given hematuria - limited echo read pending - remember that he needs SBE ppx going forward for any dental procedures - has post-op echo and follow-up scheduled for 09/27/19  4. Renal cell carcinoma with recent urinary retention and hematuria and UTI/epididymoorchitis-still with foley cath in place from previous admission for urinary retention. Completed last dose of fosfomycin while inpatient. [facility] neph[facility] as well as voiding trial - creatinine has remained normal this admission - discussed case via phone with Dr. Tresa Moore who has concerns about patient's candidacy for major surgery given evidence of demand ischemia this admission, may consider more conservative non-operative approach - he recommends to keep foley in place and his office will contact patient to reschedule follow-up  5. DM type 2- was on susbtantial dose of Lantus at home, held here due to variable hospital course - blood sugars did not support resuming - per d/w DM coordinator, we have changed him to Novolog 3 units TIDWC meal coverage + sensitive SSI correction scale  6. Anemia - in context of recent issues above - Hgb trend 11.8 - 12.7 - 13.1 - 12.9 - 12.7 - 11.9 - 12.2 - foley bag did not have any gross hematuria in it - anticipate continuing to monitor as outpatient periodically  7. Carotid artery disease - MRI last admission showed4 mm acute infarct left medial frontalcortex, which was felt to be incidentaland procedural related.CTA of the head and neck show moderate 65 to 75% bilateral ICA stenoses -will require continued surveillance and outpatient  follow-up  8. Hyperlipidemia with goal LDL <70 given CAD - rosuvastatin titrated this admission -If the patient is tolerating statin at time of follow-up appointment, would consider rechecking liver function/lipid panel in 6weeks  9. Hypomagnesemia - level  was 1.6 on 09/08/10, improved to 1.8 yesterday - will give 1g mag sulfate today  10. PACs on telemetry - asymptomatic   For questions or updates, please contact [facility] Please consult [email or web address] for contact info under Cardiology/STEMI.  Signed, Charlie Pitter, PA-C 09/12/2019, 8:19 AM    I have personally seen and examined this patient. I agree with the assessment and plan as outlined above. Alexis Griffith has a complex history with recent TAVR, DM, HTN, CVA, OSA, multi-vessel CAD and recently diagnosed renal cell carcinoma admitted with chest pain. TAVR was on 08/15/19. He had a prolonged postoperative course following TAVR due to urinary retention, hematuria, AKI. Also with orthostatic hypotension. He was discharged with an indwelling foley catheter with plans to see urology today to discuss his catheter and planning for resection of RCC.   Pt stable for d/c to SNF today. BP stable. No orthostasis. H/H stable. No chest pain.  Plans for medical management of CAD. Outpatient follow up with urology.   [name] 09/12/2019 10:25 AM

## 2019-09-12 NOTE — Progress Notes (Signed)
Report given to [facility], receiving nurse at [facility]. All questions answered. Patient is currently awaiting transport via [facility].

## 2019-09-12 NOTE — Progress Notes (Signed)
Patient's BP improved to 115/44 after receiving scheduled meds.  However, he became anxious when discussing discharging to [facility] tonight.  States he is worried that his BP will go back up, they don't have telemetry there to monitor him, and they may have to bring him back to the hospital.  Patient would like to stay in hospital overnight.  Dr. Kalman Shan notified of the above.  Patient may stay tonight and be discharged in the morning per MD order.  Jodell Cipro

## 2019-09-12 NOTE — TOC Progression Note (Signed)
Transition of Care [facility]) - Progression Note    Patient Details  Name: Alexis Griffith MRN: [id number] Date of Birth: December 03, 1940  Transition of Care [facility]) CM/SW [place], [place] Phone Number: 09/12/2019, 11:49 AM  Clinical Narrative:      CSW spoke with patient at bedside. Patient is chose SNF bed at [facility] in [place]. Patients insurance authorization has been approved. Reference number is #[id number]. Insurance authorization is good from 7/13-7/15.  Patient has SNF bed at [facility] when medically ready for discharge. Patients insurance authorization has been approved.  CSW will continue to follow.   Expected Discharge Plan: Skilled Nursing Facility Barriers to Discharge: Continued Medical Work up  Expected Discharge Plan and Services Expected Discharge Plan: [place]                                               Social Determinants of Health (SDOH) Interventions    Readmission Risk Interventions No flowsheet data found.

## 2019-09-12 NOTE — Plan of Care (Signed)
  Problem: Education: °Goal: Understanding of cardiac disease, CV risk reduction, and recovery process will improve °Outcome: Progressing °Goal: Individualized Educational Video(s) °Outcome: Progressing °  °Problem: Activity: °Goal: Ability to tolerate increased activity will improve °Outcome: Progressing °  °Problem: Cardiac: °Goal: Ability to achieve and maintain adequate cardiovascular perfusion will improve °Outcome: Progressing °  °Problem: Health Behavior/Discharge Planning: °Goal: Ability to safely manage health-related needs after discharge will improve °Outcome: Progressing °  °Problem: Education: °Goal: Knowledge of General Education information will improve °Description: Including pain rating scale, medication(s)/side effects and non-pharmacologic comfort measures °Outcome: Progressing °  °

## 2019-09-12 NOTE — Progress Notes (Signed)
[facility] here to transport patient to [facility].  Unable to transport patient d/t elevated BP, 190s/70s.  Dr. Kalman Shan notified.  Will give patient's scheduled klonopin and metoprolol per MD order.  Will contact [facility] once patient's BP is better controlled, per MD order.  Patient notifying his wife of the above.  Jodell Cipro

## 2019-09-13 ENCOUNTER — Ambulatory Visit: Payer: Medicare Other | Admitting: Cardiovascular Disease

## 2019-09-13 ENCOUNTER — Other Ambulatory Visit (HOSPITAL_COMMUNITY): Payer: Medicare Other

## 2019-09-13 LAB — CBC
HCT: 37.9 % — ABNORMAL LOW (ref 39.0–52.0)
Hemoglobin: 12.2 g/dL — ABNORMAL LOW (ref 13.0–17.0)
MCH: 26.8 pg (ref 26.0–34.0)
MCHC: 32.2 g/dL (ref 30.0–36.0)
MCV: 83.3 fL (ref 80.0–100.0)
Platelets: 144 10*3/uL — ABNORMAL LOW (ref 150–400)
RBC: 4.55 MIL/uL (ref 4.22–5.81)
RDW: 13.4 % (ref 11.5–15.5)
WBC: 7.5 10*3/uL (ref 4.0–10.5)
nRBC: 0 % (ref 0.0–0.2)

## 2019-09-13 LAB — GLUCOSE, CAPILLARY: Glucose-Capillary: 156 mg/dL — ABNORMAL HIGH (ref 70–99)

## 2019-09-13 LAB — BASIC METABOLIC PANEL
Anion gap: 10 (ref 5–15)
BUN: 14 mg/dL (ref 8–23)
CO2: 24 mmol/L (ref 22–32)
Calcium: 9.2 mg/dL (ref 8.9–10.3)
Chloride: 100 mmol/L (ref 98–111)
Creatinine, Ser: 0.89 mg/dL (ref 0.61–1.24)
GFR calc Af Amer: >60 mL/min (ref >60)
GFR calc non Af Amer: >60 mL/min (ref >60)
Glucose, Bld: 164 mg/dL — ABNORMAL HIGH (ref 70–99)
Potassium: 4 mmol/L (ref 3.5–5.1)
Sodium: 134 mmol/L — ABNORMAL LOW (ref 135–145)

## 2019-09-13 NOTE — TOC Transition Note (Signed)
Transition of Care [facility]) - CM/SW Discharge Note   Patient Details  Name: Alexis Griffith MRN: [id number] Date of Birth: 06-18-1940  Transition of Care [facility]) CM/SW Contact:  Trula Ore, [place] Phone Number: 09/13/2019, 10:09 AM   Clinical Narrative:     Patient will DC to: [facility]  Anticipated DC date: 09/13/2019  Family notified: [name]   Transport by: Corey Harold   ?  Per MD patient ready for DC to . RN, patient, patient's family, and facility notified of DC. Discharge Summary sent to facility. RN given number for report tele# [phone] RM# [phone]. DC packet on chart. Ambulance transport requested for patient.  CSW signing off.  Final next level of care: Skilled Nursing Facility Barriers to Discharge: No Barriers Identified   Patient Goals and CMS Choice Patient states their goals for this hospitalization and ongoing recovery are:: to go to SNF CMS Medicare.gov Compare Post Acute Care list provided to:: Patient Choice offered to / list presented to : Patient  Discharge Placement              Patient chooses bed at: [facility] Patient to be transferred to facility by: [place] Name of family member notified: [name] Patient and family notified of of transfer: 09/13/19  Discharge Plan and Services                                     Social Determinants of Health (SDOH) Interventions     Readmission Risk Interventions No flowsheet data found.

## 2019-09-13 NOTE — Progress Notes (Signed)
Physical Therapy Treatment Patient Details Name: Alexis Griffith MRN: [id number] DOB: [birth year] Today's Date: 09/13/2019    History of Present Illness Pt is a 79 y/o male with hx of HTN, HLD, DM2, OSA, hx of CVA, multivessel CAD, renal cell carcinoma, severe AS s/p TAVR 03/03/70 with complicated post procedural course with orthostatic hypotension and urologic issues.  Presenting to ED on 7/10 with severe hypertension and chest pain found to have NSTEMI.     PT Comments    Pt agreeable to ambulation with therapy while waiting for PTAR. Pt is concerned that his BP will be too high for transport. BP in sitting 156/65. Pt continues to be limited in safe mobility by decreased strength and balance. Pt is supervision for bed mobility, and min guard for transfers and ambulation with RW. Pt with mild instability throughout session but no overt LoB. Pt will benefit from getting back to therapy at [facility] where the rehab staff is familiar with his prior treatment and goals.   Follow Up Recommendations  SNF     Equipment Recommendations  None recommended by PT    Recommendations for Other Services OT consult     Precautions / Restrictions Precautions Precautions: Fall Precaution Comments: Watch BP and HR Restrictions Weight Bearing Restrictions: No    Mobility  Bed Mobility Overal bed mobility: Needs Assistance Bed Mobility: Supine to Sit     Supine to sit: HOB elevated;Supervision Sit to supine: Supervision   General bed mobility comments: supervision for safety, vc for Foley management, increased time and effort required  Transfers Overall transfer level: Needs assistance Equipment used: Rolling walker (2 wheeled) Transfers: Sit to/from Stand Sit to Stand: Min guard         General transfer comment: min guard for safety, good power up increased effort to balance   Ambulation/Gait Ambulation/Gait assistance: Min guard Gait Distance (Feet): 160 Feet Assistive device: Rolling  walker (2 wheeled) Gait Pattern/deviations: Step-through pattern;Decreased stride length;Trunk flexed Gait velocity: decreased Gait velocity interpretation: <1.8 ft/sec, indicate of risk for recurrent falls General Gait Details: min guard for slow, mildly unsteady gait, no overt LoB, vc for proximity to RW      Modified Rankin (Stroke Patients Only) Modified Rankin (Stroke Patients Only) Pre-Morbid Rankin Score: No significant disability Modified Rankin: Moderately severe disability     Balance Overall balance assessment: Needs assistance Sitting-balance support: Feet supported;No upper extremity supported Sitting balance-Leahy Scale: Good Sitting balance - Comments: supervision   Standing balance support: Bilateral upper extremity supported Standing balance-Leahy Scale: Poor Standing balance comment: requires UE support this morning for self steadying                             Cognition Arousal/Alertness: Awake/alert Behavior During Therapy: Flat affect Overall Cognitive Status: Within Functional Limits for tasks assessed                                 General Comments: continues to have a flat affect but reports being happy about being able to go to SNF today         General Comments General comments (skin integrity, edema, etc.): VSS on RA BP 156/65 sitting EoB      Pertinent Vitals/Pain Pain Assessment: No/denies pain           PT Goals (current goals can now be found in the care plan section) Acute  Rehab PT Goals Patient Stated Goal: hopes to be able to get home to wife PT Goal Formulation: With patient Time For Goal Achievement: 09/24/19 Potential to Achieve Goals: Good Progress towards PT goals: Progressing toward goals    Frequency    Min 3X/week      PT Plan Discharge plan needs to be updated       AM-PAC PT "6 Clicks" Mobility   Outcome Measure  Help needed turning from your back to your side while in a flat  bed without using bedrails?: None Help needed moving from lying on your back to sitting on the side of a flat bed without using bedrails?: None Help needed moving to and from a bed to a chair (including a wheelchair)?: A Little Help needed standing up from a chair using your arms (e.g., wheelchair or bedside chair)?: A Little Help needed to walk in hospital room?: A Little Help needed climbing 3-5 steps with a railing? : A Little 6 Click Score: 20    End of Session Equipment Utilized During Treatment: Gait belt Activity Tolerance: Patient tolerated treatment well Patient left: with call bell/phone within reach;in bed;with bed alarm set Nurse Communication: Mobility status PT Visit Diagnosis: Other abnormalities of gait and mobility (R26.89);Muscle weakness (generalized) (M62.81);Other symptoms and signs involving the nervous system (W78.718)     Time: 1040-1056 PT Time Calculation (min) (ACUTE ONLY): 16 min  Charges:  $Therapeutic Exercise: 8-22 mins                     Norvel Wenker B. Migdalia Dk PT, DPT Acute Rehabilitation Services Pager [phone] Office [phone]    [place] 09/13/2019, 11:09 AM

## 2019-09-13 NOTE — Progress Notes (Addendum)
Progress Note  Patient Name: Alexis Griffith Date of Encounter: 09/13/2019  [facility] Cardiologist: Mertie Moores, MD   Subjective   fluctuating blood pressure. Mild chest discomfort. No shortness of breath.   Inpatient Medications    Scheduled Meds: . amLODipine  5 mg Oral Daily  . aspirin EC  81 mg Oral Daily  . Chlorhexidine Gluconate Cloth  6 each Topical Daily  . clonazePAM  0.5 mg Oral QID  . insulin aspart  0-9 Units Subcutaneous TID WC  . insulin aspart  3 Units Subcutaneous TID WC  . isosorbide mononitrate  60 mg Oral Daily  . metoprolol tartrate  25 mg Oral BID  . rosuvastatin  20 mg Oral q1800   Continuous Infusions:  PRN Meds: acetaminophen, alum & mag hydroxide-simeth, hydrALAZINE, nitroGLYCERIN, ondansetron (ZOFRAN) IV   Vital Signs    Vitals:   09/12/19 2047 09/12/19 2117 09/12/19 2347 09/13/19 0639  BP: (!) 195/73 (!) 115/44 137/71 (!) 162/65  Pulse: 90 67 71 62  Resp:   18 18  Temp:   98 F (36.7 C) 98.1 F (36.7 C)  TempSrc:   Oral Oral  SpO2: 92% 91% 93% 92%  Weight:      Height:        Intake/Output Summary (Last 24 hours) at 09/13/2019 0855 Last data filed at 09/13/2019 0700 Gross per 24 hour  Intake 340 ml  Output 4550 ml  Net -4210 ml   Last 3 Weights 09/12/2019 09/11/2019 09/10/2019  Weight (lbs) 179 lb 14.4 oz 187 lb 6.3 oz 188 lb 7.9 oz  Weight (kg) 81.602 kg 85 kg 85.5 kg      Telemetry    SR with PVcs- Personally Reviewed  ECG    N/A  Physical Exam   GEN: No acute distress.   Neck: No JVD Cardiac: RRR, no murmurs, rubs, or gallops.  Respiratory: Clear to auscultation bilaterally. GI: Soft, nontender, non-distended  MS: No edema; No deformity. Neuro:  Nonfocal  Psych: Normal affect   Labs    High Sensitivity Troponin:   Recent Labs  Lab 09/08/19 2349 09/09/19 0308 09/09/19 0541 09/09/19 1446 09/09/19 1659  TROPONINIHS 1,311* 2,757* 3,124* 1,189* 911*      Chemistry Recent Labs  Lab 09/08/19 2227  09/08/19 2227 09/09/19 0308 09/12/19 0406 09/13/19 0536  NA 133*   < > 137 135 134*  K 3.4*   < > 3.8 3.9 4.0  CL 101   < > 103 101 100  CO2 23   < > 22 24 24   GLUCOSE 218*   < > 141* 151* 164*  BUN 12   < > 11 17 14   CREATININE 0.79   < > 0.73 1.07 0.89  CALCIUM 9.4   < > 9.4 9.2 9.2  PROT 6.3*  --   --   --   --   ALBUMIN 2.9*  --   --   --   --   AST 22  --   --   --   --   ALT 20  --   --   --   --   ALKPHOS 50  --   --   --   --   BILITOT 0.4  --   --   --   --   GFRNONAA >60   < > >60 >60 >60  GFRAA >60   < > >60 >60 >60  ANIONGAP 9   < > 12 10 10    < > =  values in this interval not displayed.     Hematology Recent Labs  Lab 09/11/19 0321 09/11/19 0321 09/11/19 1427 09/12/19 0406 09/13/19 0536  WBC 7.9  --   --  8.7 7.5  RBC 4.37  --   --  4.39 4.55  HGB 11.9*   < > 12.2* 12.2* 12.2*  HCT 36.5*   < > 37.7* 36.6* 37.9*  MCV 83.5  --   --  83.4 83.3  MCH 27.2  --   --  27.8 26.8  MCHC 32.6  --   --  33.3 32.2  RDW 13.2  --   --  13.2 13.4  PLT 174  --   --  170 144*   < > = values in this interval not displayed.    BNP Recent Labs  Lab 09/08/19 2139 09/09/19 0308  BNP 222.2* 326.2*     DDimer No results for input(s): DDIMER in the last 168 hours.   Radiology    No results found.  Cardiac Studies   LHC 07/2019  Ost LM lesion is 30% stenosed.  Prox LAD to Mid LAD lesion is 70% stenosed with 70% stenosed side branch in 2nd Diag. Both vessels positive by CT FFR  Mid LAD to Dist LAD lesion is 40% stenosed. Dist LAD lesion is 75% stenosed.  Prox Cx lesion is 45% stenosed. Prox Cx to Mid Cx lesion is 75% stenosed. RFR positive is 0.87  Mid Cx lesion is 5% stenosed with 50% stenosed side branch in 3rd Mrg. No significant drop in RFR reading beyond this lesion.  Ost RCA to Dist RCA lesion is 100% stenosed. Significant left to right collaterals fill the PDA and PL system.  RPDA lesion is 50% stenosed.  --------------------------------------  The  left ventricular systolic function is normal. The left ventricular ejection fraction is 55-65% by visual estimate. Regional wall motion knowledged and inferior wall.  LV end diastolic pressure is moderately elevated.  There is ~SEVERE AORTIC VALVE STENOSIS.   SUMMARY  Severe three-vessel disease: 100% CTO of ostial RCA (left to right collaterals fill PDA and PL), long 70% calcified proximal to mid LAD (positive by CT FFR), segmental ~75% proximal-mid LCx (positive by RFR)  Borderline SEVERE AORTIC STENOSIS-mean gradient estimated 37 mmHg by pullback.  Normal LVEF with basal inferior hypokinesis seen on echocardiogram and LV gram.   Plan will be to consult CVTS/Cardiology Valve Clinic to consider options between two-vessel PCI and TAVR versus CABG/AVR. He is not having resting chest pain, will discharge home today on Imdur pending Valve Clinic evaluation..   Patient Profile     79 y.o.malewith recent TAVR 08/15/19 with complex post-procedural course, DM, HTN, CVA, TIA, sleep apnea, multivessel CAD as above, chronic diastolic CHF, recently diagnosed renal cell carcinoma, pulmonary nodules who was readmitted with chest pain.  Assessment & Plan    1. NSTEMI with known CAD - Hs troponin peaked at 3124. Recommended medication therapy. Completed 48 hours of heparin.  - Continue ASA, statin, BB and Imdur  2. Accelerated HTN with recent orthostatic hypotension - Midodrine and mestinon discontinued due to elevated blood pressure - Continue current medications - BP SBP > 165, can increase anti-hyertensive  No significant change from yesterday's discharge summary.   For questions or updates, please contact [facility] Please consult [email or web address] for contact info under        Signed, Leanor Kail, PA  09/13/2019, 8:55 AM    I have personally seen and examined this patient. I agree  with the assessment and plan as outlined above.  Pt doing well this am. Continue ASA,  statin, Imdur and beta blocker. Urology follow up as outpatient. Mestinon stopped. BP stable. D/C to SNF today.   [name] 09/13/2019 9:40 AM

## 2019-09-19 ENCOUNTER — Other Ambulatory Visit: Payer: Self-pay

## 2019-09-19 ENCOUNTER — Inpatient Hospital Stay (HOSPITAL_COMMUNITY)
Admission: EM | Admit: 2019-09-19 | Discharge: 2019-09-28 | DRG: 311 | Disposition: A | Discharge status: Skilled Nursing Facility | Payer: Medicare Other | Attending: Cardiovascular Disease | Admitting: Cardiovascular Disease

## 2019-09-19 ENCOUNTER — Encounter (HOSPITAL_COMMUNITY): Payer: Self-pay | Admitting: Emergency Medicine

## 2019-09-19 ENCOUNTER — Emergency Department (HOSPITAL_COMMUNITY): Payer: Medicare Other

## 2019-09-19 DIAGNOSIS — R339 Retention of urine, unspecified: Secondary | ICD-10-CM | POA: Diagnosis present

## 2019-09-19 DIAGNOSIS — I493 Ventricular premature depolarization: Secondary | ICD-10-CM | POA: Diagnosis present

## 2019-09-19 DIAGNOSIS — R079 Chest pain, unspecified: Secondary | ICD-10-CM

## 2019-09-19 DIAGNOSIS — R918 Other nonspecific abnormal finding of lung field: Secondary | ICD-10-CM | POA: Diagnosis present

## 2019-09-19 DIAGNOSIS — N2889 Other specified disorders of kidney and ureter: Secondary | ICD-10-CM | POA: Diagnosis present

## 2019-09-19 DIAGNOSIS — I248 Other forms of acute ischemic heart disease: Principal | ICD-10-CM | POA: Diagnosis present

## 2019-09-19 DIAGNOSIS — Z888 Allergy status to other drugs, medicaments and biological substances status: Secondary | ICD-10-CM

## 2019-09-19 DIAGNOSIS — E7800 Pure hypercholesterolemia, unspecified: Secondary | ICD-10-CM | POA: Diagnosis present

## 2019-09-19 DIAGNOSIS — Z952 Presence of prosthetic heart valve: Secondary | ICD-10-CM

## 2019-09-19 DIAGNOSIS — I110 Hypertensive heart disease with heart failure: Secondary | ICD-10-CM | POA: Diagnosis present

## 2019-09-19 DIAGNOSIS — Z8249 Family history of ischemic heart disease and other diseases of the circulatory system: Secondary | ICD-10-CM

## 2019-09-19 DIAGNOSIS — C649 Malignant neoplasm of unspecified kidney, except renal pelvis: Secondary | ICD-10-CM | POA: Diagnosis present

## 2019-09-19 DIAGNOSIS — Z8673 Personal history of transient ischemic attack (TIA), and cerebral infarction without residual deficits: Secondary | ICD-10-CM

## 2019-09-19 DIAGNOSIS — Z882 Allergy status to sulfonamides status: Secondary | ICD-10-CM

## 2019-09-19 DIAGNOSIS — I779 Disorder of arteries and arterioles, unspecified: Secondary | ICD-10-CM | POA: Diagnosis present

## 2019-09-19 DIAGNOSIS — Z7982 Long term (current) use of aspirin: Secondary | ICD-10-CM

## 2019-09-19 DIAGNOSIS — Z6825 Body mass index (BMI) 25.0-25.9, adult: Secondary | ICD-10-CM

## 2019-09-19 DIAGNOSIS — I6523 Occlusion and stenosis of bilateral carotid arteries: Secondary | ICD-10-CM | POA: Diagnosis present

## 2019-09-19 DIAGNOSIS — Z794 Long term (current) use of insulin: Secondary | ICD-10-CM

## 2019-09-19 DIAGNOSIS — Z880 Allergy status to penicillin: Secondary | ICD-10-CM

## 2019-09-19 DIAGNOSIS — Z85810 Personal history of malignant neoplasm of tongue: Secondary | ICD-10-CM

## 2019-09-19 DIAGNOSIS — I5032 Chronic diastolic (congestive) heart failure: Secondary | ICD-10-CM | POA: Diagnosis present

## 2019-09-19 DIAGNOSIS — D696 Thrombocytopenia, unspecified: Secondary | ICD-10-CM | POA: Diagnosis present

## 2019-09-19 DIAGNOSIS — E1151 Type 2 diabetes mellitus with diabetic peripheral angiopathy without gangrene: Secondary | ICD-10-CM | POA: Diagnosis present

## 2019-09-19 DIAGNOSIS — F419 Anxiety disorder, unspecified: Secondary | ICD-10-CM | POA: Diagnosis present

## 2019-09-19 DIAGNOSIS — E119 Type 2 diabetes mellitus without complications: Secondary | ICD-10-CM

## 2019-09-19 DIAGNOSIS — Z20822 Contact with and (suspected) exposure to covid-19: Secondary | ICD-10-CM | POA: Diagnosis present

## 2019-09-19 DIAGNOSIS — Z87891 Personal history of nicotine dependence: Secondary | ICD-10-CM

## 2019-09-19 DIAGNOSIS — I2489 Other forms of acute ischemic heart disease: Secondary | ICD-10-CM

## 2019-09-19 DIAGNOSIS — I509 Heart failure, unspecified: Secondary | ICD-10-CM

## 2019-09-19 DIAGNOSIS — I25119 Atherosclerotic heart disease of native coronary artery with unspecified angina pectoris: Secondary | ICD-10-CM | POA: Diagnosis present

## 2019-09-19 DIAGNOSIS — K5900 Constipation, unspecified: Secondary | ICD-10-CM | POA: Diagnosis present

## 2019-09-19 DIAGNOSIS — C7800 Secondary malignant neoplasm of unspecified lung: Secondary | ICD-10-CM | POA: Diagnosis present

## 2019-09-19 DIAGNOSIS — I2510 Atherosclerotic heart disease of native coronary artery without angina pectoris: Secondary | ICD-10-CM | POA: Diagnosis present

## 2019-09-19 DIAGNOSIS — C642 Malignant neoplasm of left kidney, except renal pelvis: Secondary | ICD-10-CM | POA: Diagnosis present

## 2019-09-19 DIAGNOSIS — I252 Old myocardial infarction: Secondary | ICD-10-CM

## 2019-09-19 DIAGNOSIS — R0989 Other specified symptoms and signs involving the circulatory and respiratory systems: Secondary | ICD-10-CM | POA: Diagnosis present

## 2019-09-19 DIAGNOSIS — Z79899 Other long term (current) drug therapy: Secondary | ICD-10-CM

## 2019-09-19 DIAGNOSIS — E785 Hyperlipidemia, unspecified: Secondary | ICD-10-CM | POA: Diagnosis present

## 2019-09-19 DIAGNOSIS — E669 Obesity, unspecified: Secondary | ICD-10-CM | POA: Diagnosis present

## 2019-09-19 DIAGNOSIS — I951 Orthostatic hypotension: Secondary | ICD-10-CM | POA: Diagnosis present

## 2019-09-19 HISTORY — DX: Chronic diastolic (congestive) heart failure: I50.32

## 2019-09-19 HISTORY — DX: Other nonspecific abnormal finding of lung field: R91.8

## 2019-09-19 LAB — CBC WITH DIFFERENTIAL/PLATELET
Abs Immature Granulocytes: 0.03 10*3/uL (ref 0.00–0.07)
Basophils Absolute: 0 10*3/uL (ref 0.0–0.1)
Basophils Relative: 1 %
Eosinophils Absolute: 0.2 10*3/uL (ref 0.0–0.5)
Eosinophils Relative: 2 %
HCT: 43.4 % (ref 39.0–52.0)
Hemoglobin: 13.8 g/dL (ref 13.0–17.0)
Immature Granulocytes: 0 %
Lymphocytes Relative: 16 %
Lymphs Abs: 1.2 10*3/uL (ref 0.7–4.0)
MCH: 27.1 pg (ref 26.0–34.0)
MCHC: 31.8 g/dL (ref 30.0–36.0)
MCV: 85.1 fL (ref 80.0–100.0)
Monocytes Absolute: 0.6 10*3/uL (ref 0.1–1.0)
Monocytes Relative: 8 %
Neutro Abs: 5.7 10*3/uL (ref 1.7–7.7)
Neutrophils Relative %: 73 %
Platelets: 143 10*3/uL — ABNORMAL LOW (ref 150–400)
RBC: 5.1 MIL/uL (ref 4.22–5.81)
RDW: 14.3 % (ref 11.5–15.5)
WBC: 7.7 10*3/uL (ref 4.0–10.5)
nRBC: 0 % (ref 0.0–0.2)

## 2019-09-19 LAB — TROPONIN I (HIGH SENSITIVITY)
Troponin I (High Sensitivity): 23 ng/L — ABNORMAL HIGH (ref <18)
Troponin I (High Sensitivity): 28 ng/L — ABNORMAL HIGH (ref <18)
Troponin I (High Sensitivity): 35 ng/L — ABNORMAL HIGH (ref <18)

## 2019-09-19 LAB — CREATININE, SERUM
Creatinine, Ser: 0.86 mg/dL (ref 0.61–1.24)
GFR calc Af Amer: >60 mL/min (ref >60)
GFR calc non Af Amer: >60 mL/min (ref >60)

## 2019-09-19 LAB — BASIC METABOLIC PANEL
Anion gap: 11 (ref 5–15)
BUN: 20 mg/dL (ref 8–23)
CO2: 24 mmol/L (ref 22–32)
Calcium: 9.7 mg/dL (ref 8.9–10.3)
Chloride: 100 mmol/L (ref 98–111)
Creatinine, Ser: 1.08 mg/dL (ref 0.61–1.24)
GFR calc Af Amer: >60 mL/min (ref >60)
GFR calc non Af Amer: >60 mL/min (ref >60)
Glucose, Bld: 228 mg/dL — ABNORMAL HIGH (ref 70–99)
Potassium: 4.2 mmol/L (ref 3.5–5.1)
Sodium: 135 mmol/L (ref 135–145)

## 2019-09-19 LAB — CBG MONITORING, ED: Glucose-Capillary: 290 mg/dL — ABNORMAL HIGH (ref 70–99)

## 2019-09-19 LAB — CBC
HCT: 42.4 % (ref 39.0–52.0)
Hemoglobin: 13.9 g/dL (ref 13.0–17.0)
MCH: 27.5 pg (ref 26.0–34.0)
MCHC: 32.8 g/dL (ref 30.0–36.0)
MCV: 83.8 fL (ref 80.0–100.0)
Platelets: 143 10*3/uL — ABNORMAL LOW (ref 150–400)
RBC: 5.06 MIL/uL (ref 4.22–5.81)
RDW: 14.3 % (ref 11.5–15.5)
WBC: 7.8 10*3/uL (ref 4.0–10.5)
nRBC: 0 % (ref 0.0–0.2)

## 2019-09-19 LAB — MAGNESIUM: Magnesium: 1.7 mg/dL (ref 1.7–2.4)

## 2019-09-19 LAB — SARS CORONAVIRUS 2 BY RT PCR (HOSPITAL ORDER, PERFORMED IN ~~LOC~~ HOSPITAL LAB): SARS Coronavirus 2: NEGATIVE

## 2019-09-19 MED ORDER — FENOFIBRATE 160 MG PO TABS
160.0000 mg | ORAL_TABLET | Freq: Every day | ORAL | Status: DC
  Administered 2019-09-20 – 2019-09-28 (×9): 160 mg via ORAL
  Filled 2019-09-19 (×11): qty 1

## 2019-09-19 MED ORDER — SODIUM CHLORIDE 0.9% FLUSH
3.0000 mL | Freq: Two times a day (BID) | INTRAVENOUS | Status: DC
  Administered 2019-09-19 – 2019-09-27 (×16): 3 mL via INTRAVENOUS

## 2019-09-19 MED ORDER — SODIUM CHLORIDE 0.9% FLUSH: 3.0000 mL | INTRAVENOUS | Status: DC | PRN

## 2019-09-19 MED ORDER — INSULIN ASPART 100 UNIT/ML ~~LOC~~ SOLN
0.0000 [IU] | Freq: Three times a day (TID) | SUBCUTANEOUS | Status: DC
  Administered 2019-09-20 (×3): 2 [IU] via SUBCUTANEOUS
  Administered 2019-09-21: 3 [IU] via SUBCUTANEOUS
  Administered 2019-09-21 – 2019-09-22 (×3): 2 [IU] via SUBCUTANEOUS
  Administered 2019-09-22: 3 [IU] via SUBCUTANEOUS
  Administered 2019-09-22: 1 [IU] via SUBCUTANEOUS
  Administered 2019-09-23: 3 [IU] via SUBCUTANEOUS
  Administered 2019-09-23: 2 [IU] via SUBCUTANEOUS
  Administered 2019-09-23: 3 [IU] via SUBCUTANEOUS
  Administered 2019-09-24 – 2019-09-25 (×4): 2 [IU] via SUBCUTANEOUS
  Administered 2019-09-25: 5 [IU] via SUBCUTANEOUS
  Administered 2019-09-25: 1 [IU] via SUBCUTANEOUS
  Administered 2019-09-26 – 2019-09-27 (×4): 2 [IU] via SUBCUTANEOUS
  Administered 2019-09-27: 1 [IU] via SUBCUTANEOUS
  Administered 2019-09-27: 3 [IU] via SUBCUTANEOUS

## 2019-09-19 MED ORDER — SODIUM CHLORIDE 0.9 % IV SOLN: 250.0000 mL | INTRAVENOUS | Status: DC | PRN

## 2019-09-19 MED ORDER — ROSUVASTATIN CALCIUM 20 MG PO TABS
20.0000 mg | ORAL_TABLET | Freq: Every day | ORAL | Status: DC
  Administered 2019-09-20 – 2019-09-28 (×9): 20 mg via ORAL
  Filled 2019-09-19 (×10): qty 1

## 2019-09-19 MED ORDER — CLONAZEPAM 0.5 MG PO TABS
0.5000 mg | ORAL_TABLET | Freq: Four times a day (QID) | ORAL | Status: DC
  Administered 2019-09-19 – 2019-09-28 (×36): 0.5 mg via ORAL
  Filled 2019-09-19 (×37): qty 1

## 2019-09-19 MED ORDER — POLYVINYL ALCOHOL 1.4 % OP SOLN
1.0000 [drp] | Freq: Two times a day (BID) | OPHTHALMIC | Status: DC | PRN
  Filled 2019-09-19: qty 15

## 2019-09-19 MED ORDER — INSULIN ASPART 100 UNIT/ML ~~LOC~~ SOLN
3.0000 [IU] | Freq: Three times a day (TID) | SUBCUTANEOUS | Status: DC
  Administered 2019-09-20 – 2019-09-28 (×27): 3 [IU] via SUBCUTANEOUS

## 2019-09-19 MED ORDER — ASPIRIN 81 MG PO CHEW
81.0000 mg | CHEWABLE_TABLET | Freq: Every day | ORAL | Status: DC
  Administered 2019-09-20 – 2019-09-28 (×9): 81 mg via ORAL
  Filled 2019-09-19 (×10): qty 1

## 2019-09-19 MED ORDER — NITROGLYCERIN 0.4 MG SL SUBL
0.4000 mg | SUBLINGUAL_TABLET | SUBLINGUAL | Status: DC | PRN
  Administered 2019-09-19 (×3): 0.4 mg via SUBLINGUAL
  Filled 2019-09-19: qty 1

## 2019-09-19 MED ORDER — AMLODIPINE BESYLATE 5 MG PO TABS: 5.0000 mg | ORAL_TABLET | Freq: Every day | ORAL | Status: DC

## 2019-09-19 MED ORDER — ISOSORBIDE MONONITRATE ER 30 MG PO TB24: 60.0000 mg | ORAL_TABLET | Freq: Every day | ORAL | Status: DC

## 2019-09-19 MED ORDER — METOPROLOL TARTRATE 50 MG PO TABS
50.0000 mg | ORAL_TABLET | Freq: Two times a day (BID) | ORAL | Status: DC
  Administered 2019-09-19 – 2019-09-28 (×18): 50 mg via ORAL
  Filled 2019-09-19 (×3): qty 1
  Filled 2019-09-19: qty 2
  Filled 2019-09-19 (×4): qty 1
  Filled 2019-09-19: qty 2
  Filled 2019-09-19 (×9): qty 1

## 2019-09-19 MED ORDER — ACETAMINOPHEN 500 MG PO TABS
500.0000 mg | ORAL_TABLET | Freq: Four times a day (QID) | ORAL | Status: DC | PRN
  Administered 2019-09-22 – 2019-09-24 (×4): 1000 mg via ORAL
  Administered 2019-09-25: 500 mg via ORAL
  Filled 2019-09-19: qty 1
  Filled 2019-09-19 (×4): qty 2

## 2019-09-19 MED ORDER — ONDANSETRON HCL 4 MG/2ML IJ SOLN: 4.0000 mg | Freq: Four times a day (QID) | INTRAMUSCULAR | Status: DC | PRN

## 2019-09-19 MED ORDER — HEPARIN SODIUM (PORCINE) 5000 UNIT/ML IJ SOLN
5000.0000 [IU] | Freq: Three times a day (TID) | INTRAMUSCULAR | Status: DC
  Administered 2019-09-19 – 2019-09-28 (×25): 5000 [IU] via SUBCUTANEOUS
  Filled 2019-09-19 (×24): qty 1

## 2019-09-19 NOTE — ED Triage Notes (Signed)
Pt BIB [facility] from [facility]. Pt had an aortic valve replacement the 15th of June. Pt at [facility] for rehab. Pt complaint of sudden onset CP this morning. Left sided non-radiating and 6/10 pain. Facility noted pt BP increased approx. 210/90 @ facility. Pt received 324 aspirin via facility, nitro x1 via EMS. VSS. NAD.

## 2019-09-19 NOTE — ED Provider Notes (Signed)
[facility] EMERGENCY DEPARTMENT Provider Note   CSN: [id number] Arrival date & time: 09/19/19  1426     History Chief Complaint  Patient presents with  . Chest Pain    Alexis Griffith is a 79 y.o. male history CHF, CAD, diabetes, hypertension, hyperlipidemia, TAVR, sleep apnea, CVA, indwelling Foley.  Patient presents today from rehab facility for chest pain onset around 11 AM today while waiting to be served breakfast.  Reports a dull pain across the chest, initially severe, nonradiating, no clear aggravating or alleviating factors.  He received full dose aspirin and 1 nitro prior to arrival with improvement of his pain which he now describes as mild.  Denies recent illness, fevers/chills, headache, neck pain, cough/hemoptysis, shortness of breath, abdominal pain, nausea/vomiting, extremity swelling/color change or any additional concerns.  HPI  HPI: A 79 year old patient with a history of CVA, peripheral artery disease, treated diabetes, hypertension and hypercholesterolemia presents for evaluation of chest pain. Initial onset of pain was less than one hour ago. The patient's chest pain is described as heaviness/pressure/tightness and is not worse with exertion. The patient's chest pain is not middle- or left-sided, is not well-localized, is not sharp and does not radiate to the arms/jaw/neck. The patient does not complain of nausea and denies diaphoresis. The patient has not smoked in the past 90 days, has no relevant family history of coronary artery disease (first degree relative at less than age 34) and does not have an elevated BMI (>=30).   Past Medical History:  Diagnosis Date  . Anxiety   . 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 CAD (coronary artery disease)   . Carotid artery disease ([place])    CTA 08/2019 of the head and neck show moderate 65 to 75% bilateral ICA stenoses   . Chronic diastolic CHF (congestive heart failure) ([place])   . Diabetes mellitus type 2  in nonobese (HCC)   . Excessive urination at night 06/05/2013  . 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 Hyperlipidemia   . Hypertension   . Hypertensive urgency   . Hypomagnesemia 09/11/2019  . Mild anemia 09/11/2019  . NSTEMI (non-ST elevated myocardial infarction) ([place]) 09/09/2019  . Orthostatic hypotension 09/11/2019  . Premature atrial contractions 09/12/2019  . Pulmonary nodules   . PVC (premature ventricular contraction)   . Renal mass   . 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 Burt Knack & Dr. Cyndia Bent  . Severe aortic stenosis   . Sinus bradycardia on ECG   . Sleep apnea   . 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 Wilburn Cornelia.  . Stroke ([place])   . TIA (transient ischemic attack)   . Urinary retention 09/11/2019    Patient Active Problem List   Diagnosis Date Noted  . Premature atrial contractions 09/12/2019  . Orthostatic hypotension 09/11/2019  . Urinary retention 09/11/2019  . Mild anemia 09/11/2019  . Hypomagnesemia 09/11/2019  . NSTEMI (non-ST elevated myocardial infarction) ([place]) 09/09/2019  . Elevated troponin   . Chest pain   . Hypertensive urgency   . Acute on chronic diastolic heart failure ([place]) 08/15/2019  . 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 Diabetes mellitus type 2 in nonobese (HCC)   . Severe aortic stenosis   . Sleep apnea   . Renal mass   . Coronary artery disease involving native coronary artery of native heart with angina pectoris (  [place]) 07/04/2019  . 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 Excessive urination at night 06/05/2013  . TIA (transient ischemic attack) 07/27/2012  . 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 Carotid artery disease ([place]) 08/24/2011  . PVC (premature ventricular contraction)   . Hypertension   . Hyperlipidemia LDL goal <70   . Anxiety   . Sinus bradycardia on ECG     Past Surgical History:    Procedure Laterality Date  . CATARACT EXTRACTION, BILATERAL  10/2015, and 11/2015   with adding  TORIC lenses bilaterally   . INTRAVASCULAR PRESSURE WIRE/FFR STUDY N/A 07/07/2019   Procedure: INTRAVASCULAR PRESSURE WIRE/FFR STUDY;  Surgeon: [name], MD;  Location: [facility];  Service: Cardiovascular;  Laterality: N/A;  . LEFT HEART CATH AND CORONARY ANGIOGRAPHY N/A 07/07/2019   Procedure: LEFT HEART CATH AND CORONARY ANGIOGRAPHY;  Surgeon: [name], MD;  Location: [facility] CV [facility];  Service: Cardiovascular;  Laterality: N/A;  . SQUAMOUS CELL CARCINOMA EXCISION     tongue  . TEE WITHOUT CARDIOVERSION N/A 08/15/2019   Procedure: TRANSESOPHAGEAL ECHOCARDIOGRAM (TEE);  Surgeon: Sherren Mocha, MD;  Location: [facility];  Service: Open Heart Surgery;  Laterality: N/A;  . TRANSCATHETER AORTIC VALVE REPLACEMENT, TRANSFEMORAL N/A 08/15/2019   Procedure: TRANSCATHETER AORTIC VALVE REPLACEMENT, TRANSFEMORAL using Edwards [employer] SAPIEN 3 Ultra 26 mm THV.;  Surgeon: Sherren Mocha, MD;  Location: [facility];  Service: Open Heart Surgery;  Laterality: N/A;       Family History  Problem Relation Age of Onset  . Heart disease Mother   . Heart attack Mother   . Heart disease Father   . Heart attack Father     Social History   Tobacco Use  . Smoking status: Former Smoker    Quit date: 06/25/1975    Years since quitting: 44.2  . Smokeless tobacco: Never Used  Vaping Use  . Vaping Use: Never used  Substance Use Topics  . Alcohol use: No  . Drug use: No    Home Medications Prior to Admission medications   Medication Sig Start Date End Date Taking? Authorizing Provider  acetaminophen (TYLENOL) 500 MG tablet Take 1-2 tablets (500-1,000 mg total) by mouth every 6 (six) hours as needed for mild pain or moderate pain. 09/12/19   Dunn, Nedra Hai, PA-C  amLODipine (NORVASC) 5 MG tablet Take 1 tablet (5 mg total) by mouth daily. 09/13/19   Dunn, Nedra Hai, PA-C  aspirin 81 MG chewable  tablet Chew 1 tablet (81 mg total) by mouth daily. 09/01/19   Eileen Stanford, PA-C  carboxymethylcellulose (REFRESH PLUS) 0.5 % SOLN Place 1 drop into both eyes 2 (two) times daily as needed (dry eyes).    [provider]  Choline Fenofibrate (TRILIPIX) 135 MG capsule Take 135 mg by mouth daily.      [provider]  clonazePAM (KLONOPIN) 0.5 MG tablet Take 1 tablet (0.5 mg total) by mouth 4 (four) times daily. 09/12/19   Dunn, Nedra Hai, PA-C  Coenzyme Q10 200 MG capsule Take 200 mg by mouth daily.    [provider]  glucose blood test strip 1 each by Other route as needed. Patient not taking: Reported on 09/08/2019 05/12/12   [provider]  [place] (DERMACLOUD) CREA Apply 1 application topically in the morning, at noon, and at bedtime.    [provider]  insulin aspart (NOVOLOG) 100 UNIT/ML injection Inject 0-9 Units into the skin 3 (three) times daily with meals.  Correction coverage: Sensitive CBG < 70: Implement Hypoglycemia Standing Orders CBG 70 - 120: 0 units CBG 121 - 150: 1 unit CBG 151 - 200: 2 units CBG 201 - 250: 3 units CBG 251 - 300: 5 units CBG 301 - 350: 7 units CBG 351 - 400: 9 units CBG > 400: call MD 09/12/19   Charlie Pitter, PA-C  insulin aspart (NOVOLOG) 100 UNIT/ML injection Inject 3 Units into the skin 3 (three) times daily with meals. If patient eats >50% of meals. 09/12/19   Dunn, Nedra Hai, PA-C  isosorbide mononitrate (IMDUR) 60 MG 24 hr tablet Take 1 tablet (60 mg total) by mouth daily. 09/13/19   Dunn, Nedra Hai, PA-C  Lancets (ONETOUCH DELICA PLUS [id number]) MISC 1 each by Other route daily.  Patient not taking: Reported on 09/08/2019 03/23/18   [provider]  metoprolol tartrate (LOPRESSOR) 25 MG tablet Take 1 tablet (25 mg total) by mouth 2 (two) times daily. 09/12/19   Dunn, Nedra Hai, PA-C  nitroGLYCERIN (NITROSTAT) 0.4 MG SL tablet Place 1 tablet (0.4 mg total) under the tongue every 5 (five) minutes as  needed for chest pain (up to 3 doses). Do not give if blood pressure is low (less than 196 systolic). 09/12/19   Dunn, Dayna N, PA-C  ONE TOUCH ULTRA TEST test strip 1 each by Other route daily.  Patient not taking: Reported on 09/08/2019 05/12/12   [provider]  rosuvastatin (CRESTOR) 20 MG tablet Take 1 tablet (20 mg total) by mouth daily. 09/12/19   Dunn, Nedra Hai, PA-C    Allergies    Macrodantin [nitrofurantoin], Sulfa antibiotics, Lisinopril, and Penicillins  Review of Systems   Review of Systems Ten systems are reviewed and are negative for acute change except as noted in the HPI  Physical Exam Updated Vital Signs BP (!) 178/56   Pulse (!) 32   Temp 98.5 F (36.9 C) (Oral)   Resp 17   Ht 5\' 10"  (1.778 m)   Wt 81.6 kg   SpO2 95%   BMI 25.81 kg/m   Physical Exam Constitutional:      General: He is not in acute distress.    Appearance: Normal appearance. He is well-developed. He is obese. He is not ill-appearing or diaphoretic.  HENT:     Head: Normocephalic and atraumatic.  Eyes:     General: Vision grossly intact. Gaze aligned appropriately.     Pupils: Pupils are equal, round, and reactive to light.  Neck:     Trachea: Trachea and phonation normal.  Cardiovascular:     Rate and Rhythm: Normal rate and regular rhythm.     Pulses:          Radial pulses are 2+ on the right side and 2+ on the left side.       Dorsalis pedis pulses are 2+ on the right side and 2+ on the left side.  Pulmonary:     Effort: Pulmonary effort is normal. No respiratory distress.     Breath sounds: Normal breath sounds.  Chest:     Chest wall: No deformity, tenderness or crepitus.  Abdominal:     General: There is no distension.     Palpations: Abdomen is soft.     Tenderness: There is no abdominal tenderness. There is no guarding or rebound.  Musculoskeletal:        General: Normal range of motion.     Cervical back: Normal range of motion.  Right lower leg: No tenderness.  No edema.     Left lower leg: No tenderness. No edema.  Skin:    General: Skin is warm and dry.  Neurological:     Mental Status: He is alert.     GCS: GCS eye subscore is 4. GCS verbal subscore is 5. GCS motor subscore is 6.     Comments: Speech is clear and goal oriented, follows commands Major Cranial nerves without deficit, no facial droop Moves extremities without ataxia, coordination intact  Psychiatric:        Behavior: Behavior normal.     ED Results / Procedures / Treatments   Labs (all labs ordered are listed, but only abnormal results are displayed) Labs Reviewed  BASIC METABOLIC PANEL - Abnormal; Notable for the following components:      Result Value   Glucose, Bld 228 (*)    All other components within normal limits  CBC WITH DIFFERENTIAL/PLATELET - Abnormal; Notable for the following components:   Platelets 143 (*)    All other components within normal limits  TROPONIN I (HIGH SENSITIVITY) - Abnormal; Notable for the following components:   Troponin I (High Sensitivity) 23 (*)    All other components within normal limits  TROPONIN I (HIGH SENSITIVITY) - Abnormal; Notable for the following components:   Troponin I (High Sensitivity) 28 (*)    All other components within normal limits  SARS CORONAVIRUS 2 BY RT PCR (HOSPITAL ORDER, [place] [facility])  MAGNESIUM    EKG None  Radiology DG Chest Port 1 View  Result Date: 09/19/2019 CLINICAL DATA:  79 year old male with chest pain. EXAM: PORTABLE CHEST 1 VIEW COMPARISON:  Chest radiograph dated 09/08/2019. FINDINGS: There are bibasilar linear atelectasis/scarring. No focal consolidation, pleural effusion, or pneumothorax. Probable background of mild emphysema. There is mild cardiomegaly. Coronary vascular calcification and aortic valve repair. Atherosclerotic calcification of the aortic arch. No acute osseous pathology. IMPRESSION: 1. No acute cardiopulmonary process. 2. Mild cardiomegaly.  Electronically Signed   By: Anner Crete M.D.   On: 09/19/2019 15:16    Procedures Procedures (including critical care time)  Medications Ordered in ED Medications  nitroGLYCERIN (NITROSTAT) SL tablet 0.4 mg (0.4 mg Sublingual Given 09/19/19 1631)  metoprolol tartrate (LOPRESSOR) tablet 50 mg (has no administration in time range)  clonazePAM (KLONOPIN) tablet 0.5 mg (has no administration in time range)  insulin aspart (novoLOG) injection 0-9 Units (has no administration in time range)    ED Course  I have reviewed the triage vital signs and the nursing notes.  Pertinent labs & imaging results that were available during my care of the patient were reviewed by me and considered in my medical decision making (see chart for details).  Clinical Course as of Sep 18 1857  Tue Sep 19, 2019  1607 Trish   [BM]  1618 Dr. Gaetano Hawthorne   [BM]    Clinical Course User Index [BM] Gari Crown   MDM Rules/Calculators/A&P HEAR Score: 5                        Additional History Obtained from: 1. Nursing notes from this visit. 2. Electronic medical record.  Patient was admitted to the hospital on September 08, 2019, and NSTEMI which was medically managed.  He has a history of multivessel CAD, not a PCI that at that time. ------------------------------- I ordered, reviewed and interpreted labs which include: CBC shows no leukocytosis to suggest infection  and no anemia. BMP shows no emergent electrolyte derangement, AKI or gap Initial troponin of 23, delta troponin of 28  CXR:  IMPRESSION:  1. No acute cardiopulmonary process.  2. Mild cardiomegaly.  I personally reviewed patient's chest x-ray and agree with radiologist interpretation  EKG reviewed with Dr. Kathrynn Humble shows no acute ischemic changes.  Concern patient symptoms may be secondary to angina today.  No tachycardia, tachypnea or hypoxia to suggest pulmonary embolism, additionally low suspicion for dissection, infection or  other acute pathologies at this time.  Patient seen and evaluated by Dr. Kathrynn Humble, advises cardiology consultation. ---- Patient reevaluated multiple times, chest pain improved following nitroglycerin.  Consult was called to cardiology who saw and evaluated the patient and have admitted the patient to their service.  On reevaluation patient is resting comfortably no acute distress vital signs within normal limits states understanding of care plan and is agreeable for admission.  Note: Portions of this report may have been transcribed using voice recognition software. Every effort was made to ensure accuracy; however, inadvertent computerized transcription errors may still be present. Final Clinical Impression(s) / ED Diagnoses Final diagnoses:  Chest pain, unspecified type    Rx / DC Orders ED Discharge Orders    None       Gari Crown 09/19/19 1900    Varney Biles, MD 09/27/19 1035

## 2019-09-19 NOTE — H&P (Addendum)
Cardiology Admission History and Physical:   Patient ID: Alexis Griffith MRN: [id number]; DOB: 12/03/1940   Admission date: 09/19/2019  Primary Care Provider: Haywood Pao, MD [facility] Cardiologist: Mertie Moores, MD / [place] Electrophysiologist:  Virl Axe, MD   Chief Complaint:  Chest pain  Patient Profile:   Alexis Griffith is a 79 y.o. 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  History of Present Illness:   He recently underwent TAVR 08/15/19, with prolonged hospital course due to issues withacute urinary retention, hematuria, AKI, UTI/epididymoorchitis requiring antibiotic,MRI with 13mm infarct felt to be incidental/procedural related,and orthostatic hypotension with recurrent syncoperequiring midodrine, Mestinon, and short-term Florinef. He was discharged to [facility] nursing facility. He required continued foley at discharge with plan to see urology as outpatient. He was readmitted 7/9-7/13/21 with chest pain, elevated blood pressure and ruled in for NSTEMI with peak troponin 3124. Case was reviewed with Dr. Burt Knack and Dr. Angelena Form and conservative approach was undertaken given that if he were to have PCI, he would have to be on DAPT - had had recent gross hematuria requiring cessation of Plavix. He did tolerate 48 hours of heparin infusion. Anti-anginals/anti-hypertensives were titrated and his orthostatic agents were able to be discontinued. Regarding his renal mass, we had a telephone conversation with Dr. Tresa Moore with urology who felt that given his recent cardiac issues he would not be a good candidate for renal mass resection due to risk of surgery. The plan was for him to see  urology as an outpatient and Dr. Zettie Pho office was to contact the patient for rescheduling. Limited echo 09/10/19 showed stable findings 1 month post TAVR with normal function of the bioprosthetic valve, no perivalvular leak, normal LVEF, grade 1 DD.   He reports he initially did well at [facility] after discharge, albeit with blood pressures fluctuating - normal to high, no low readings per his report. Today he was sitting in his wheelchair awaiting rehab session when he developed chest tightness across his whole chest. No SOB or nausea. He requested an aide check his BP - it was done with a wrist cuff that is historically inaccurate and was 77/38. They called for a nurse who rechecked his BP and it was 638V systolic. He was escorted to the rehab station and they rechecked his BP and it was just over 200 so his session was cancelled. He alerted the facility for ongoing CP and EMS was called. He received 324mg  ASA and 1 SL NTG. He was given an additional 3 SL NTG. All in all the patient reports his discomfort lasted several hours. He reports they said he was somewhat clammy. Currently he feels much better, still has a vague awareness in his chest. Telemetry shows NSR with occasional PACs, PVCs, and occasional periods of ventricular bigeminy and trigeminy. Labs show normal Hgb, plt 143, Cr 1.08, hsTroponin 23->28. SBP in the ED 140s-170s/diastolics 56E-33I. CXR no acute CP process, mild cardiomegaly. He is not tachycardic, tachypneic or hypoxic.  Past Medical History:  Diagnosis Date  . Anxiety   . 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 CAD (coronary artery disease)   . Carotid artery disease ([place])    CTA 08/2019 of the head and neck show moderate 65 to 75% bilateral  ICA stenoses   . Chronic diastolic CHF (congestive heart failure) ([place])   . Diabetes mellitus type 2 in nonobese (HCC)   . Excessive urination at night 06/05/2013  . Giant cell arteritis ([place]) 05/17/2012  . Hyperlipidemia   . Hypertension   .  Hypertensive urgency   . Hypomagnesemia 09/11/2019  . Mild anemia 09/11/2019  . NSTEMI (non-ST elevated myocardial infarction) ([place]) 09/09/2019  . Orthostatic hypotension 09/11/2019  . Premature atrial contractions 09/12/2019  . Pulmonary nodules   . PVC (premature ventricular contraction)   . Renal mass   . 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 Burt Knack & Dr. Cyndia Bent  . Severe aortic stenosis   . Sinus bradycardia on ECG   . Sleep apnea   . 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 Wilburn Cornelia.  . Stroke ([place])   . TIA (transient ischemic attack)   . Urinary retention 09/11/2019    Past Surgical History:  Procedure Laterality Date  . CATARACT EXTRACTION, BILATERAL  10/2015, and 11/2015   with adding  TORIC lenses bilaterally   . INTRAVASCULAR PRESSURE WIRE/FFR STUDY N/A 07/07/2019   Procedure: INTRAVASCULAR PRESSURE WIRE/FFR STUDY;  Surgeon: [name], MD;  Location: [place] [facility];  Service: Cardiovascular;  Laterality: N/A;  . LEFT HEART CATH AND CORONARY ANGIOGRAPHY N/A 07/07/2019   Procedure: LEFT HEART CATH AND CORONARY ANGIOGRAPHY;  Surgeon: [name], MD;  Location: [place] [facility];  Service: Cardiovascular;  Laterality: N/A;  . SQUAMOUS CELL CARCINOMA EXCISION     tongue  . TEE WITHOUT CARDIOVERSION N/A 08/15/2019   Procedure: TRANSESOPHAGEAL ECHOCARDIOGRAM (TEE);  Surgeon: Sherren Mocha, MD;  Location: [place];  Service: Open Heart Surgery;  Laterality: N/A;  . TRANSCATHETER AORTIC VALVE REPLACEMENT, TRANSFEMORAL N/A 08/15/2019   Procedure: TRANSCATHETER AORTIC VALVE REPLACEMENT, TRANSFEMORAL using Edwards [employer] SAPIEN 3 Ultra 26 mm THV.;  Surgeon: Sherren Mocha, MD;  Location: [place];  Service: Open Heart Surgery;  Laterality: N/A;     Medications Prior to Admission: Prior to Admission medications   Medication Sig Start Date End Date Taking?  Authorizing Provider  acetaminophen (TYLENOL) 500 MG tablet Take 1-2 tablets (500-1,000 mg total) by mouth every 6 (six) hours as needed for mild pain or moderate pain. 09/12/19   Dunn, Nedra Hai, PA-C  amLODipine (NORVASC) 5 MG tablet Take 1 tablet (5 mg total) by mouth daily. 09/13/19   Dunn, Nedra Hai, PA-C  aspirin 81 MG chewable tablet Chew 1 tablet (81 mg total) by mouth daily. 09/01/19   Eileen Stanford, PA-C  carboxymethylcellulose (REFRESH PLUS) 0.5 % SOLN Place 1 drop into both eyes 2 (two) times daily as needed (dry eyes).    [provider]  Choline Fenofibrate (TRILIPIX) 135 MG capsule Take 135 mg by mouth daily.      [provider]  clonazePAM (KLONOPIN) 0.5 MG tablet Take 1 tablet (0.5 mg total) by mouth 4 (four) times daily. 09/12/19   Dunn, Nedra Hai, PA-C  Coenzyme Q10 200 MG capsule Take 200 mg by mouth daily.    [provider]  glucose blood test strip 1 each by Other route as needed. Patient not taking: Reported on 09/08/2019 05/12/12   [provider]  [place] (DERMACLOUD) CREA Apply 1 application topically in the morning, at noon, and at bedtime.    [provider]  insulin aspart (  NOVOLOG) 100 UNIT/ML injection Inject 0-9 Units into the skin 3 (three) times daily with meals. Correction coverage: Sensitive CBG < 70: Implement Hypoglycemia Standing Orders CBG 70 - 120: 0 units CBG 121 - 150: 1 unit CBG 151 - 200: 2 units CBG 201 - 250: 3 units CBG 251 - 300: 5 units CBG 301 - 350: 7 units CBG 351 - 400: 9 units CBG > 400: call MD 09/12/19   Charlie Pitter, PA-C  insulin aspart (NOVOLOG) 100 UNIT/ML injection Inject 3 Units into the skin 3 (three) times daily with meals. If patient eats >50% of meals. 09/12/19   Dunn, Nedra Hai, PA-C  isosorbide mononitrate (IMDUR) 60 MG 24 hr tablet Take 1 tablet (60 mg total) by mouth daily. 09/13/19   Dunn, Nedra Hai, PA-C  Lancets (ONETOUCH DELICA PLUS [id number]) MISC 1 each by Other route  daily.  Patient not taking: Reported on 09/08/2019 03/23/18   [provider]  metoprolol tartrate (LOPRESSOR) 25 MG tablet Take 1 tablet (25 mg total) by mouth 2 (two) times daily. 09/12/19   Dunn, Nedra Hai, PA-C  nitroGLYCERIN (NITROSTAT) 0.4 MG SL tablet Place 1 tablet (0.4 mg total) under the tongue every 5 (five) minutes as needed for chest pain (up to 3 doses). Do not give if blood pressure is low (less than 992 systolic). 09/12/19   Dunn, Dayna N, PA-C  ONE TOUCH ULTRA TEST test strip 1 each by Other route daily.  Patient not taking: Reported on 09/08/2019 05/12/12   [provider]  rosuvastatin (CRESTOR) 20 MG tablet Take 1 tablet (20 mg total) by mouth daily. 09/12/19   Charlie Pitter, PA-C     Allergies:    Allergies  Allergen Reactions  . Macrodantin [Nitrofurantoin] Other (See Comments)    "blocked kidneys"   . Sulfa Antibiotics Rash  . Lisinopril Cough  . Penicillins     08/30/19- Patient can't remember, was 35+ years ago. OK with trying cephalexin in hospital    Social History:   Social History   Socioeconomic History  . Marital status: Married    Spouse name: Not on file  . Number of children: 2  . Years of education: Not on file  . Highest education level: Not on file  Occupational History  . Not on file  Tobacco Use  . Smoking status: Former Smoker    Quit date: 06/25/1975    Years since quitting: 44.2  . Smokeless tobacco: Never Used  Vaping Use  . Vaping Use: Never used  Substance and Sexual Activity  . Alcohol use: No  . Drug use: No  . Sexual activity: Yes  Other Topics Concern  . Not on file  Social History Narrative   Patient lives at home with his wife.    Social Determinants of Health   Financial Resource Strain:   . Difficulty of Paying Living Expenses:   Food Insecurity:   . Worried About Charity fundraiser in the Last Year:   . Arboriculturist in the Last Year:   Transportation Needs:   . Film/video editor (Medical):   Marland Kitchen  Lack of Transportation (Non-Medical):   Physical Activity:   . Days of Exercise per Week:   . Minutes of Exercise per Session:   Stress:   . Feeling of Stress :   Social Connections:   . Frequency of Communication with Friends and Family:   . Frequency of Social Gatherings with Friends and Family:   .  Attends Religious Services:   . Active Member of Clubs or Organizations:   . Attends Archivist Meetings:   Marland Kitchen Marital Status:   Intimate Partner Violence:   . Fear of Current or Ex-Partner:   . Emotionally Abused:   Marland Kitchen Physically Abused:   . Sexually Abused:     Family History:   The patient's family history includes Heart attack in his father and mother; Heart disease in his father and mother.    ROS:  Please see the history of present illness.  All other ROS reviewed and negative.     Physical Exam/Data:   Vitals:   09/19/19 1615 09/19/19 1621 09/19/19 1700 09/19/19 1704  BP: (!) 168/53 (!) 140/56 (!) 178/50   Pulse:   (!) 28   Resp:  (!) 21 (!) 9   Temp:      TempSrc:      SpO2:  93%  94%  Weight:      Height:       No intake or output data in the 24 hours ending 09/19/19 1741 Last 3 Weights 09/19/2019 09/12/2019 09/11/2019  Weight (lbs) 179 lb 14.3 oz 179 lb 14.4 oz 187 lb 6.3 oz  Weight (kg) 81.6 kg 81.602 kg 85 kg     Body mass index is 25.81 kg/m.  General: Well developed, well nourished WM, in no acute distress. Head: Normocephalic, atraumatic, sclera non-icteric, no xanthomas, nares are without discharge. Neck: Negative for carotid bruits. JVP not elevated. Lungs: Clear bilaterally to auscultation without wheezes, rales, or rhonchi. Breathing is unlabored. Heart: RRR S1 S2 without murmurs, rubs, or gallops.  Abdomen: Soft, non-tender, non-distended with normoactive bowel sounds. No rebound/guarding. Extremities: No clubbing or cyanosis. No edema. Distal pedal pulses are 2+ and equal bilaterally. Neuro: Alert and oriented X 3. Moves all extremities  spontaneously. Psych:  Responds to questions appropriately with a normal affect.    EKG:  The ECG that was done today was personally reviewed and demonstrates NSR 91bpm, probable LAE and LVH, frequent PVCs with trigeminy, nonspecific STT changes with subtle ST depression I, avL, V6  Relevant CV Studies: 2D echo 09/10/19  1. Stable findings 1 month post TAVR, normal function of the  bioprosthetic valve, mean transaortic gradient 12 mmHg, no paravalvular  leak.  2. Left ventricular ejection fraction, by estimation, is 60 to 65%. The  left ventricle has normal function. The left ventricle has no regional  wall motion abnormalities. There is moderate concentric left ventricular  hypertrophy. Left ventricular  diastolic parameters are consistent with Grade I diastolic dysfunction  (impaired relaxation). Elevated left atrial pressure.  3. Right ventricular systolic function is normal. The right ventricular  size is normal.  4. The mitral valve is normal in structure. Trivial mitral valve  regurgitation. No evidence of mitral stenosis.  5. The aortic valve has been repaired/replaced. Aortic valve  regurgitation is not visualized. No aortic stenosis is present. There is a  26 mm Edwards Sapien prosthetic (TAVR) valve present in the aortic  position. Procedure Date: 08/15/2019. Echo findings  are consistent with normal structure and function of the aortic valve  prosthesis.  6. The inferior vena cava is normal in size with greater than 50%  respiratory variability, suggesting right atrial pressure of 3 mmHg.   LHC 07/2019  Ost LM lesion is 30% stenosed.  Prox LAD to Mid LAD lesion is 70% stenosed with 70% stenosed side branch in 2nd Diag. Both vessels positive by CT FFR  Mid LAD  to Dist LAD lesion is 40% stenosed. Dist LAD lesion is 75% stenosed.  Prox Cx lesion is 45% stenosed. Prox Cx to Mid Cx lesion is 75% stenosed. RFR positive is 0.87  Mid Cx lesion is 5% stenosed with 50%  stenosed side branch in 3rd Mrg. No significant drop in RFR reading beyond this lesion.  Ost RCA to Dist RCA lesion is 100% stenosed. Significant left to right collaterals fill the PDA and PL system.  RPDA lesion is 50% stenosed.  --------------------------------------  The left ventricular systolic function is normal. The left ventricular ejection fraction is 55-65% by visual estimate. Regional wall motion knowledged and inferior wall.  LV end diastolic pressure is moderately elevated.  There is ~SEVERE AORTIC VALVE STENOSIS.    SUMMARY  Severe three-vessel disease: 100% CTO of ostial RCA (left to right collaterals fill PDA and PL), long 70% calcified proximal to mid LAD (positive by CT FFR), segmental ~75% proximal-mid LCx (positive by RFR)  Borderline SEVERE AORTIC STENOSIS-mean gradient estimated 37 mmHg by pullback.  Normal LVEF with basal inferior hypokinesis seen on echocardiogram and LV gram.   Plan will be to consult CVTS/Cardiology Valve Clinic to consider options between two-vessel PCI and TAVR versus CABG/AVR. He is not having resting chest pain, will discharge home today on Imdur pending Valve Clinic evaluation.Glenetta Hew, MD   Laboratory Data:  High Sensitivity Troponin:   Recent Labs  Lab 09/09/19 0541 09/09/19 1446 09/09/19 1659 09/19/19 1432 09/19/19 1632  TROPONINIHS 3,124* 1,189* 911* 23* 28*      Chemistry Recent Labs  Lab 09/13/19 0536 09/19/19 1432  NA 134* 135  K 4.0 4.2  CL 100 100  CO2 24 24  GLUCOSE 164* 228*  BUN 14 20  CREATININE 0.89 1.08  CALCIUM 9.2 9.7  GFRNONAA >60 >60  GFRAA >60 >60  ANIONGAP 10 11    No results for input(s): PROT, ALBUMIN, AST, ALT, ALKPHOS, BILITOT in the last 168 hours. Hematology Recent Labs  Lab 09/13/19 0536 09/19/19 1432  WBC 7.5 7.7  RBC 4.55 5.10  HGB 12.2* 13.8  HCT 37.9* 43.4  MCV 83.3 85.1  MCH 26.8 27.1  MCHC 32.2 31.8  RDW 13.4 14.3  PLT 144* 143*   BNPNo results  for input(s): BNP, PROBNP in the last 168 hours.  DDimer No results for input(s): DDIMER in the last 168 hours.   Radiology/Studies:  DG Chest Port 1 View  Result Date: 09/19/2019 CLINICAL DATA:  79 year old male with chest pain. EXAM: PORTABLE CHEST 1 VIEW COMPARISON:  Chest radiograph dated 09/08/2019. FINDINGS: There are bibasilar linear atelectasis/scarring. No focal consolidation, pleural effusion, or pneumothorax. Probable background of mild emphysema. There is mild cardiomegaly. Coronary vascular calcification and aortic valve repair. Atherosclerotic calcification of the aortic arch. No acute osseous pathology. IMPRESSION: 1. No acute cardiopulmonary process. 2. Mild cardiomegaly. Electronically Signed   By: Anner Crete M.D.   On: 09/19/2019 15:16       HEAR Score (for undifferentiated chest pain):  HEAR Score: 5     Assessment and Plan:   1. Chest pain/elevated troponin in the context of known multivessel CAD with recent NSTEMI - recent troponin elevation to 3124, limited echo 09/10/19 with normal LVEF, managed medically due to complex ongoing issue with renal mass and recent hematuria. - hsTroponin low-level at 23->28 - continued hypertension noted which may be contributing - will review with MD - per preliminary discussion, anticipate titration of beta blocker, holding off full dose heparin  given only low-level troponin elevation and resolved chest pain  2. HTN with recent issues with orthostatic hypotension which have resolved - will review with MD in context of the above, likely needs advancement of baseline antihypertensives/antianginals.  3. Severe AS s/p recent TAVR 08/15/19  - has been on ASA alone given hematuria - 1 month post-op echo during last admission was stable  4. Renal cell carcinoma with recent urinary retention requiring foley, hematuria and UTI/epididymoorchitis (completed course of fosfamycin last admission) - continue foley in place, no recent hematuria  noted per patient - per last admission's discussion, Dr. Zettie Pho office to reach out to patient to r/s - will need to ensure concrete plan for follow-up at discharge - also with pulmonary nodules that will need to be followed over time  5. DM type 2 - as with last admission, anticipate continuing Novolog 3 units TIDwc and SSI  6. Carotid artery disease - MRI last admission showed4 mm acute infarct left medial frontalcortex, which was felt to be incidentaland procedural related.CTA of the head and neck show moderate 65 to 75% bilateral ICA stenoses -will require continued surveillance and outpatient follow-up  7. Hyperlipidemia with goal LDL <70 given CAD  - rosuvastatin recently titrated, therefore will continue. -If the patient is tolerating statin at time of follow-up appointment, would consider rechecking liver function/lipid panel in about 4-5 weeks  8. PVCs  - question whether this is contributing to symptoms, consider titration of beta blocker  - K at goal, Mg level pending  Code status: He confirms he wishes to remain full code.  Severity of Illness: The appropriate patient status for this patient is OBSERVATION. Observation status is judged to be reasonable and necessary in order to provide the required intensity of service to ensure the patient's safety. The patient's presenting symptoms, physical exam findings, and initial radiographic and laboratory data in the context of their medical condition is felt to place them at decreased risk for further clinical deterioration. Furthermore, it is anticipated that the patient will be medically stable for discharge from the hospital within 2 midnights of admission. The following factors support the patient status of observation.   " The patient's presenting symptoms include chest pain. " The physical exam findings include hypertension. " The initial radiographic and laboratory data are mildly elevated troponin, thrombocytopenia      For questions or updates, please contact [place] Please consult [email or web address] for contact info under     Signed, Charlie Pitter, PA-C  09/19/2019 5:41 PM   Patient seen and examined with Melina Copa PA-C.  Agree as above, with the following exceptions and changes as noted below. No longer having chest pain. Notes more difficulty with BP management after stopping losartan HCTZ, though it is noted that he has had extremely labile blood pressures over past several weeks. More recently, hypertension is primary issues. Gen: NAD, CV: iRRR, no murmurs, Lungs: clear, Abd: soft, Extrem: Warm, well perfused, no edema, Neuro/Psych: alert and oriented x 3, normal mood and affect. All available labs, radiology testing, previous records reviewed. Picture currently not suggestive of ACS, will not start heparin at this time. Medically complex gentleman with chronic Foley, renal mass, and recent TAVR. Chest pain in setting of severe hypertension and minimally elevated, flat troponin. Recently hospitalized last week.   Will increase metoprolol tonight for the benefit of ectopy, HR and BP, and increase Imdur in AM. PRN hydralazine can be used or nitro gtt if SBP >160 mmHg or patient is symptomatic.  Elouise Munroe, MD 09/19/19 9:08 PM

## 2019-09-20 DIAGNOSIS — R079 Chest pain, unspecified: Secondary | ICD-10-CM | POA: Diagnosis not present

## 2019-09-20 LAB — CBC
HCT: 44.1 % (ref 39.0–52.0)
Hemoglobin: 14.4 g/dL (ref 13.0–17.0)
MCH: 27.7 pg (ref 26.0–34.0)
MCHC: 32.7 g/dL (ref 30.0–36.0)
MCV: 84.8 fL (ref 80.0–100.0)
Platelets: 144 10*3/uL — ABNORMAL LOW (ref 150–400)
RBC: 5.2 MIL/uL (ref 4.22–5.81)
RDW: 14.4 % (ref 11.5–15.5)
WBC: 7.8 10*3/uL (ref 4.0–10.5)
nRBC: 0 % (ref 0.0–0.2)

## 2019-09-20 LAB — BASIC METABOLIC PANEL
Anion gap: 11 (ref 5–15)
BUN: 15 mg/dL (ref 8–23)
CO2: 25 mmol/L (ref 22–32)
Calcium: 9.9 mg/dL (ref 8.9–10.3)
Chloride: 102 mmol/L (ref 98–111)
Creatinine, Ser: 0.8 mg/dL (ref 0.61–1.24)
GFR calc Af Amer: >60 mL/min (ref >60)
GFR calc non Af Amer: >60 mL/min (ref >60)
Glucose, Bld: 187 mg/dL — ABNORMAL HIGH (ref 70–99)
Potassium: 3.8 mmol/L (ref 3.5–5.1)
Sodium: 138 mmol/L (ref 135–145)

## 2019-09-20 LAB — URINALYSIS, ROUTINE W REFLEX MICROSCOPIC
Bilirubin Urine: NEGATIVE
Glucose, UA: NEGATIVE mg/dL
Ketones, ur: NEGATIVE mg/dL
Nitrite: NEGATIVE
Protein, ur: 100 mg/dL — AB
Specific Gravity, Urine: 1.02 (ref 1.005–1.030)
WBC, UA: >50 WBC/hpf — ABNORMAL HIGH (ref 0–5)
pH: 5 (ref 5.0–8.0)

## 2019-09-20 LAB — CBG MONITORING, ED
Glucose-Capillary: 188 mg/dL — ABNORMAL HIGH (ref 70–99)
Glucose-Capillary: 177 mg/dL — ABNORMAL HIGH (ref 70–99)

## 2019-09-20 LAB — MRSA PCR SCREENING: MRSA by PCR: NEGATIVE

## 2019-09-20 LAB — GLUCOSE, CAPILLARY
Glucose-Capillary: 189 mg/dL — ABNORMAL HIGH (ref 70–99)
Glucose-Capillary: 248 mg/dL — ABNORMAL HIGH (ref 70–99)

## 2019-09-20 MED ORDER — AMLODIPINE BESYLATE 5 MG PO TABS
5.0000 mg | ORAL_TABLET | Freq: Every day | ORAL | Status: DC
  Administered 2019-09-21 – 2019-09-28 (×9): 5 mg via ORAL
  Filled 2019-09-20 (×10): qty 1

## 2019-09-20 MED ORDER — ISOSORBIDE MONONITRATE ER 30 MG PO TB24
90.0000 mg | ORAL_TABLET | Freq: Every day | ORAL | Status: DC
  Administered 2019-09-20: 90 mg via ORAL
  Filled 2019-09-20: qty 3

## 2019-09-20 MED ORDER — ISOSORBIDE MONONITRATE ER 60 MG PO TB24
60.0000 mg | ORAL_TABLET | Freq: Every day | ORAL | Status: DC
  Administered 2019-09-21 – 2019-09-28 (×8): 60 mg via ORAL
  Filled 2019-09-20 (×9): qty 1

## 2019-09-20 MED ORDER — AMLODIPINE BESYLATE 5 MG PO TABS
10.0000 mg | ORAL_TABLET | Freq: Every day | ORAL | Status: DC
  Filled 2019-09-20: qty 2

## 2019-09-20 MED ORDER — CHLORHEXIDINE GLUCONATE CLOTH 2 % EX PADS
6.0000 | MEDICATED_PAD | Freq: Every day | CUTANEOUS | Status: DC
  Administered 2019-09-20 – 2019-09-27 (×8): 6 via TOPICAL

## 2019-09-20 NOTE — Progress Notes (Addendum)
Progress Note  Patient Name: Alexis Griffith Date of Encounter: 09/20/2019  Primary Cardiologist: Mertie Moores, MD / Structural - Sherren Mocha  Subjective   Pt feels better today after having BM. No further chest pain symptoms.   Inpatient Medications    Scheduled Meds: . amLODipine  10 mg Oral Daily  . aspirin  81 mg Oral Daily  . clonazePAM  0.5 mg Oral QID  . fenofibrate  160 mg Oral Daily  . heparin  5,000 Units Subcutaneous Q8H  . insulin aspart  0-9 Units Subcutaneous TID WC  . insulin aspart  3 Units Subcutaneous TID WC  . isosorbide mononitrate  90 mg Oral Daily  . metoprolol tartrate  50 mg Oral BID  . rosuvastatin  20 mg Oral Daily  . sodium chloride flush  3 mL Intravenous Q12H   Continuous Infusions: . sodium chloride     PRN Meds: sodium chloride, acetaminophen, nitroGLYCERIN, ondansetron (ZOFRAN) IV, polyvinyl alcohol, sodium chloride flush   Vital Signs    Vitals:   09/20/19 0715 09/20/19 0915 09/20/19 1017 09/20/19 1032  BP: (!) 166/51 (!) 171/78 126/67 118/66  Pulse:    75  Resp: 19 19 18    Temp:      TempSrc:      SpO2: 95% 96% 94% 95%  Weight:      Height:        Intake/Output Summary (Last 24 hours) at 09/20/2019 1051 Last data filed at 09/20/2019 7494 Gross per 24 hour  Intake --  Output 2800 ml  Net -2800 ml   Filed Weights   09/19/19 1431  Weight: 81.6 kg    Physical Exam   General: Well developed, well nourished, NAD Neck: Negative for carotid bruits. No JVD Lungs: Diminished in bilateral lower lobes. Breathing is unlabored. Cardiovascular: RRR with S1 S2. No murmurs Abdomen: Soft, non-tender, non-distended. No obvious abdominal masses. Extremities: No edema. Radial  pulses 2+ bilaterally Neuro: Alert and oriented. No focal deficits. No facial asymmetry. MAE spontaneously. Psych: Responds to questions appropriately with normal affect.    Labs    Chemistry Recent Labs  Lab 09/19/19 1432 09/19/19 1942  09/20/19 0252  NA 135  --  138  K 4.2  --  3.8  CL 100  --  102  CO2 24  --  25  GLUCOSE 228*  --  187*  BUN 20  --  15  CREATININE 1.08 0.86 0.80  CALCIUM 9.7  --  9.9  GFRNONAA >60 >60 >60  GFRAA >60 >60 >60  ANIONGAP 11  --  11     Hematology Recent Labs  Lab 09/19/19 1432 09/19/19 1942 09/20/19 0252  WBC 7.7 7.8 7.8  RBC 5.10 5.06 5.20  HGB 13.8 13.9 14.4  HCT 43.4 42.4 44.1  MCV 85.1 83.8 84.8  MCH 27.1 27.5 27.7  MCHC 31.8 32.8 32.7  RDW 14.3 14.3 14.4  PLT 143* 143* 144*    Cardiac EnzymesNo results for input(s): TROPONINI in the last 168 hours. No results for input(s): TROPIPOC in the last 168 hours.   BNPNo results for input(s): BNP, PROBNP in the last 168 hours.   DDimer No results for input(s): DDIMER in the last 168 hours.   Radiology    DG Chest Port 1 View  Result Date: 09/19/2019 CLINICAL DATA:  79 year old male with chest pain. EXAM: PORTABLE CHEST 1 VIEW COMPARISON:  Chest radiograph dated 09/08/2019. FINDINGS: There are bibasilar linear atelectasis/scarring. No focal consolidation, pleural effusion,  or pneumothorax. Probable background of mild emphysema. There is mild cardiomegaly. Coronary vascular calcification and aortic valve repair. Atherosclerotic calcification of the aortic arch. No acute osseous pathology. IMPRESSION: 1. No acute cardiopulmonary process. 2. Mild cardiomegaly. Electronically Signed   By: Anner Crete M.D.   On: 09/19/2019 15:16   Telemetry    09/20/19 NSR- Personally Reviewed  ECG    No new tracing as of 09/20/19- Personally Reviewed  Cardiac Studies   2D echo 09/10/19  1. Stable findings 1 month post TAVR, normal function of the  bioprosthetic valve, mean transaortic gradient 12 mmHg, no paravalvular  leak.  2. Left ventricular ejection fraction, by estimation, is 60 to 65%. The  left ventricle has normal function. The left ventricle has no regional  wall motion abnormalities. There is moderate concentric left  ventricular  hypertrophy. Left ventricular  diastolic parameters are consistent with Grade I diastolic dysfunction  (impaired relaxation). Elevated left atrial pressure.  3. Right ventricular systolic function is normal. The right ventricular  size is normal.  4. The mitral valve is normal in structure. Trivial mitral valve  regurgitation. No evidence of mitral stenosis.  5. The aortic valve has been repaired/replaced. Aortic valve  regurgitation is not visualized. No aortic stenosis is present. There is a  26 mm Edwards Sapien prosthetic (TAVR) valve present in the aortic  position. Procedure Date: 08/15/2019. Echo findings  are consistent with normal structure and function of the aortic valve  prosthesis.  6. The inferior vena cava is normal in size with greater than 50%  respiratory variability, suggesting right atrial pressure of 3 mmHg.   LHC 07/2019  Ost LM lesion is 30% stenosed.  Prox LAD to Mid LAD lesion is 70% stenosed with 70% stenosed side branch in 2nd Diag. Both vessels positive by CT FFR  Mid LAD to Dist LAD lesion is 40% stenosed. Dist LAD lesion is 75% stenosed.  Prox Cx lesion is 45% stenosed. Prox Cx to Mid Cx lesion is 75% stenosed. RFR positive is 0.87  Mid Cx lesion is 5% stenosed with 50% stenosed side branch in 3rd Mrg. No significant drop in RFR reading beyond this lesion.  Ost RCA to Dist RCA lesion is 100% stenosed. Significant left to right collaterals fill the PDA and PL system.  RPDA lesion is 50% stenosed.  --------------------------------------  The left ventricular systolic function is normal. The left ventricular ejection fraction is 55-65% by visual estimate. Regional wall motion knowledged and inferior wall.  LV end diastolic pressure is moderately elevated.  There is ~SEVERE AORTIC VALVE STENOSIS.   SUMMARY  Severe three-vessel disease: 100% CTO of ostial RCA (left to right collaterals fill PDA and PL), long 70% calcified  proximal to mid LAD (positive by CT FFR), segmental ~75% proximal-mid LCx (positive by RFR)  Borderline SEVERE AORTIC STENOSIS-mean gradient estimated 37 mmHg by pullback.  Normal LVEF with basal inferior hypokinesis seen on echocardiogram and LV gram.   Plan will be to consult CVTS/Cardiology Valve Clinic to consider options between two-vessel PCI and TAVR versus CABG/AVR. He is not having resting chest pain, will discharge home today on Imdur pending Valve Clinic evaluation..  Patient Profile     79 y.o. 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  Assessment & Plan  1. Chest pain with known multivessel CAD per cath: -Recent hospitalization with NSTEMI with hsT peaked at 3124 -Limited echo 09/10/19 with normal LVEF with plans to manage medically due to complex ongoing issue with renal mass and recent hematuria. -hsTroponin low-level at 23>>28>>35 with no recurrent symptoms  -BP improved since admission>>>now soft  -Plan to continue with BP control given significantly elevated BP on presentation -Metoprolol, amlodipine and IMDUR increased 7/20 -BP more stable today at 118/66>>126/67  2. HTN: -As above, 118/66>126/67 -Improved on amlodipine 5, IMDUR 90, metoprolol 50   3. 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 Renal cell carcinoma with recent urinary retention requiring foley, hematuria and UTI/epididymoorchitis (completed course of fosfamycin last admission): -Continue foley in place, no recent hematuria noted per patient -Per last admission's discussion, Dr. Zettie Pho office to reach out to patient to r/s -Will need to ensure  concrete plan for follow-up at discharge -Also with pulmonary nodules that will need to be followed over time  5. DM2: - SSI for glucose control whole inpatient status   6. Carotid artery disease: -MRI last admission showed4 mm acute infarct left medial frontalcortex, which was felt to be incidentaland procedural related. -CTA of the head and neck show moderate 65 to 75% bilateral ICA stenoses -Provide continued surveillance and outpatient follow-up  7. Hyperlipidemia with goal LDL <70 given CAD: -Last LDL, 76 on 7/120/21  -Continue rosuvastatin>>recently titrated  8. Constipation: -Reports a three day hx of constipation>>>releived today  -Questions whether chest pain symptoms were related to this   Signed, Kathyrn Drown NP-C [facility] Pager: [phone] 09/20/2019, 10:51 AM     For questions or updates, please contact   Please consult [email or web address] for contact info under Cardiology/STEMI.  Patient seen and examined with Kathyrn Drown NP-C.  Agree as above, with the following exceptions and changes as noted below. No chest pain, feels better after BM. Gen: NAD, CV: RRR, no murmurs, Lungs: clear, Abd: soft, Extrem: Warm, well perfused, no edema, Neuro/Psych: alert and oriented x 3, normal mood and affect. All available labs, radiology testing, previous records reviewed. BP has dropped significantly with just the uptitration of metoprolol and Imdur. Will maintain metoprolol 50 mg bid but will decrease imdur back to 60 mg daily. Will not increase amlodipine. Challenging situation with very labile and chest pain with hypertension.   Elouise Munroe, MD 09/20/19 2:50 PM

## 2019-09-20 NOTE — ED Notes (Signed)
Breakfast ordered--Alexis Griffith 

## 2019-09-20 NOTE — ED Notes (Signed)
Lunch Tray Ordered @ 1023. 

## 2019-09-20 NOTE — ED Notes (Signed)
Breakfast ordered--Sianna 

## 2019-09-21 DIAGNOSIS — R079 Chest pain, unspecified: Secondary | ICD-10-CM | POA: Diagnosis not present

## 2019-09-21 LAB — GLUCOSE, CAPILLARY
Glucose-Capillary: 159 mg/dL — ABNORMAL HIGH (ref 70–99)
Glucose-Capillary: 207 mg/dL — ABNORMAL HIGH (ref 70–99)
Glucose-Capillary: 164 mg/dL — ABNORMAL HIGH (ref 70–99)
Glucose-Capillary: 243 mg/dL — ABNORMAL HIGH (ref 70–99)

## 2019-09-21 MED ORDER — HYDROCORTISONE 1 % EX CREA
TOPICAL_CREAM | CUTANEOUS | Status: DC | PRN
  Filled 2019-09-21: qty 28

## 2019-09-21 NOTE — Progress Notes (Signed)
Patient's wanting to speak to MD about discharge back to [facility]. Paged and notified Dunn, PA about the patient's concern. Dunn, PA relayed that they will round on the patient soon. Relayed the message to the patient. Will continue to monitor.

## 2019-09-22 DIAGNOSIS — R079 Chest pain, unspecified: Secondary | ICD-10-CM | POA: Diagnosis not present

## 2019-09-22 DIAGNOSIS — I248 Other forms of acute ischemic heart disease: Secondary | ICD-10-CM

## 2019-09-22 LAB — GLUCOSE, CAPILLARY
Glucose-Capillary: 145 mg/dL — ABNORMAL HIGH (ref 70–99)
Glucose-Capillary: 175 mg/dL — ABNORMAL HIGH (ref 70–99)
Glucose-Capillary: 207 mg/dL — ABNORMAL HIGH (ref 70–99)
Glucose-Capillary: 197 mg/dL — ABNORMAL HIGH (ref 70–99)

## 2019-09-22 MED ORDER — MAGNESIUM OXIDE 400 (241.3 MG) MG PO TABS
400.0000 mg | ORAL_TABLET | Freq: Every day | ORAL | Status: DC
  Administered 2019-09-22 – 2019-09-28 (×7): 400 mg via ORAL
  Filled 2019-09-22 (×7): qty 1

## 2019-09-22 MED ORDER — POLYETHYLENE GLYCOL 3350 17 G PO PACK
17.0000 g | PACK | Freq: Every day | ORAL | Status: DC | PRN
  Administered 2019-09-26: 17 g via ORAL
  Filled 2019-09-22: qty 1

## 2019-09-22 NOTE — Discharge Summary (Addendum)
Discharge Summary    Patient ID: Alexis Griffith MRN: [id number]; DOB: 22-Jan-1941  Admit date: 09/19/2019 Discharge date: 09/28/2019  Primary Care Provider: Haywood Pao, MD  Primary Cardiologist: Mertie Moores, MD / Structural - Dr. Burt Knack Primary Electrophysiologist:  Virl Axe, MD (seen for orthostasis in the past)  Discharge Diagnoses    Principal Problem:   Chest pain Active Problems:   PVC's (premature ventricular contractions)   Labile hypertension   Hyperlipidemia LDL goal <70   Anxiety   Carotid artery disease ([place])   Coronary artery disease involving native coronary artery of native heart with angina pectoris ([place])   Diabetes mellitus type 2 in nonobese [facility])   Renal mass   S/P TAVR (transcatheter aortic valve replacement)   Urinary retention   Demand ischemia ([place])   CHF (congestive heart failure) ([place])   Diagnostic Studies/Procedures    N/A _____________   History of Present Illness     Alexis Griffith is a 79 y.o. male with recent TAVR 08/15/19 with complex post-procedural course, DM, HTN, CVA, TIA, sleep apnea, multivessel CAD as above, chronic diastolic CHF, giant cell arteritis, recently diagnosed suspected renal cell carcinoma, carotid artery disease, pulmonary nodules who returned to the hospital with chest pain. This is his 3rd admission since June.  He recently underwent TAVR 08/15/19, with prolonged hospital course due to issues with acute urinary retention, hematuria, AKI, UTI/epididymoorchitis requiring antibiotic, MRI 38m infarct felt to be incidental/procedural related, and orthostatic hypotension with recurrent syncope requiring midodrine, Mestinon, and short-term Florinef. He was discharged to [facility] nursing facility. He required continued foley at discharge with plan to see urology as outpatient. He was readmitted 7/9-7/13/21 with chest pain, elevated blood pressure and ruled in for NSTEMI with peak troponin 3124. Case was reviewed with  Dr. CBurt Knackand Dr. MAngelena Formand conservative approach was undertaken given that if he were to have PCI, he would have to be on DAPT - had had recent gross hematuria requiring cessation of Plavix. He did tolerate 48 hours of heparin infusion. Anti-anginals/anti-hypertensives were titrated and his orthostatic agents were able to be discontinued. Regarding his renal mass, we had a telephone conversation with Dr. MTresa Moorewith urology who felt that given his recent cardiac issues he would not be a good candidate for renal mass resection due to risk of surgery. Dr. MTresa Mooreadvised to leave foley in place and was going to have his office was to contact the patient for rescheduling. Limited echo 09/10/19 showed stable findings 1 month post TAVR with normal function of the bioprosthetic valve, no perivalvular leak, normal LVEF, grade 1 DD.    He reports he initially did well at [facility] after discharge, albeit with labile blood pressures per his report - normal to high, no low readings per his report. On 09/19/2019 he was sitting in his wheelchair awaiting rehab when he develop recurrent chest tightness across his chest. He requested an aide check his BP - it was done with a wrist cuff that is historically inaccurate and was 77/38. They called for a nurse who rechecked his BP and it was 1944Hsystolic. He was escorted to the rehab station and they rechecked his BP and it was just over 200 so his session was cancelled. He alerted the facility for ongoing CP and EMS was called. He was transported to the hospital and received several SL NTG with relief of pain. EKG was nonacute. Telemetry showed NSR with occasional PACs, PVCs, and occasional periods of  ventricular bigeminy and trigeminy. Labs showed normal Hgb, plt 143, Cr 1.08, hsTroponin 23->28. SBP in the ED 140s-170s/diastolics 32K-02R. CXR no acute CP process, mild cardiomegaly. He was not tachycardic, tachypneic or hypoxic. Cardiology was asked to admit.   Hospital Course      1. Chest pain/elevated troponin in the context of known multivessel CAD with recent NSTEMI - patient was recently admitted last time with troponin elevation to 3124, limited echo 09/10/19 with normal LVEF. He has been managed medically due to complex ongoing issue with renal mass and recent hematuria, requiring cessation of Plavix. - hsTroponin low-level at 23->28->35 without recurrent chest pain while admitted, felt to reflect demand process rather than new ACS - labile hypertension felt to be contributing - metoprolol was increased to '50mg'$  BID - initially he was trialed on increased dose of Imdur but had hypotension so Imdur and amlodipine were continued at home doses - continue conservative management   2. HTN with recent issues with orthostatic hypotension - followed closely while inpatient in the context of above - if blood pressure is elevated moving forward, may need PRN hydralazine   3. Severe AS s/p recent TAVR 08/15/19  - has been on ASA alone given hematuria - 1 month post-op echo during last admission was stable - already has f/u planned 7/28 with Nell Range PA-C - continue SBE ppx precautions for any dental work   4. Presumed renal cell carcinoma with recent urinary retention requiring foley, hematuria and UTI/epididymoorchitis (completed course of fosfamycin last admission) - continue foley in place, no recent hematuria noted per patient - completed fosfomycin course last admission - our team discussed with Dr. Tresa Moore last admission who felt that given admission with NSTEMI and underlying CAD, patient may be less ideal operative candidate and he may pursue conservative approach. Discussed with patient last admission, advised f/u with urology for more definitive discussions. Dr. Tresa Moore also advised to leave foley in place and indicated he would have his office contact patient to arrange follow-up. Confirmed with [facility] this is still scheduled for 7/26 at 12:45pm - also  with pulmonary nodules that will need to be followed over time as outpatient   5. DM type 2  - as with last admission, anticipate continuing Novolog 3 units TIDwc and SSI. Consider resuming Lantus as an outpatient, will leave to facility medical staff.   6. Carotid artery disease - MRI last admission showed 4 mm acute infarct left medial frontal cortex, which was felt to be incidental and procedural related. CTA of the head and neck show moderate 65 to 75% bilateral ICA stenoses  - will require continued surveillance and outpatient follow-up   7. Hyperlipidemia with goal LDL <70 given CAD  - rosuvastatin recently titrated, therefore will continue - continue fenofibrate - If the patient is tolerating statin at time of follow-up appointment, would consider rechecking liver function/lipid panel in about 4 weeks  8. Constipation - frequent issue for him, symptoms reliably relieved with bowel movement.  -needs to be on a daily bowel regimen to mitigate this issue - Dr. Margaretann Loveless started Miralax daily   9. PVCs  - question whether this contributed to symptoms, beta blocker titrated - Mg 1.7, was low last admission as well, so started on MagOx '400mg'$  daily   10. Anxiety - patient shared social stressors with MD. He is greatly troubled with anxiety and she strongly believes he would benefit from CBT and counseling/psychiatry support. This should be arranged by PCP. We offered inpatient services  which patient has deferred. He will continue clonazepam, PMDP reviewed and no recent inappropriate outpatient fills. 3 day printed prescription provided at discharge due to SNF requirement. Further plan per primary care.   The patient was felt to have met maximum benefit from inpatient services. He was seen by PT and felt to benefit from ongoing SNF services. Dr. Margaretann Loveless has seen and examined the patient today and feels he is stable for discharge back to SNF when bed available.  Did the patient have an acute  coronary syndrome (MI, NSTEMI, STEMI, etc) this admission?:  No.   The elevated Troponin was due to the acute medical illness (demand ischemia).  _____________  Discharge Vitals Blood pressure (!) 153/58, pulse 72, temperature 97.6 F (36.4 C), temperature source Oral, resp. rate 15, height '5\' 10"'$  (1.778 m), weight 82.7 kg, SpO2 95 %.  Filed Weights   09/26/19 0553 09/27/19 0602 09/28/19 0442  Weight: 82.7 kg 82.3 kg 82.7 kg    Labs & Radiologic Studies    CBC Lab Results  Component Value Date   WBC 7.8 09/20/2019   HGB 14.4 09/20/2019   HCT 44.1 09/20/2019   MCV 84.8 09/20/2019   PLT 144 (L) 03/47/4259    Basic Metabolic Panel No results for input(s): NA, K, CL, CO2, GLUCOSE, BUN, CREATININE, CALCIUM, MG, PHOS in the last 72 hours. Liver Function Tests Lab Results  Component Value Date   ALT 20 09/08/2019   AST 22 09/08/2019   ALKPHOS 50 09/08/2019   BILITOT 0.4 09/08/2019    No results for input(s): LIPASE, AMYLASE in the last 72 hours. High Sensitivity Troponin:   Recent Labs  Lab 09/09/19 1446 09/09/19 1659 09/19/19 1432 09/19/19 1632 09/19/19 1942  TROPONINIHS 1,189* 911* 23* 28* 35*   _____________  DG Chest 2 View  Result Date: 09/08/2019 CLINICAL DATA:  Chest pain and shortness of breath. EXAM: CHEST - 2 VIEW COMPARISON:  August 11, 2019 FINDINGS: There is no evidence of acute infiltrate, pleural effusion or pneumothorax. A stable subcentimeter calcified nodular opacity is seen overlying the upper left lung. The heart size and mediastinal contours are within normal limits. An artificial aortic valve is seen. This represents a new finding when compared to the prior study. The visualized skeletal structures are unremarkable. IMPRESSION: No active cardiopulmonary disease. Electronically Signed   By: Virgina Norfolk M.D.   On: 09/08/2019 22:16   DG Chest Port 1 View  Result Date: 09/19/2019 CLINICAL DATA:  79 year old male with chest pain. EXAM: PORTABLE CHEST 1  VIEW COMPARISON:  Chest radiograph dated 09/08/2019. FINDINGS: There are bibasilar linear atelectasis/scarring. No focal consolidation, pleural effusion, or pneumothorax. Probable background of mild emphysema. There is mild cardiomegaly. Coronary vascular calcification and aortic valve repair. Atherosclerotic calcification of the aortic arch. No acute osseous pathology. IMPRESSION: 1. No acute cardiopulmonary process. 2. Mild cardiomegaly. Electronically Signed   By: Anner Crete M.D.   On: 09/19/2019 15:16   US SCROTUM W/DOPPLER  Result Date: 08/30/2019 CLINICAL DATA:  Right testicular pain, swelling EXAM: SCROTAL ULTRASOUND DOPPLER ULTRASOUND OF THE TESTICLES TECHNIQUE: Complete ultrasound examination of the testicles, epididymis, and other scrotal structures was performed. Color and spectral Doppler ultrasound were also utilized to evaluate blood flow to the testicles. COMPARISON:  None. FINDINGS: Right testicle Measurements: 3.9 x 3.4 x 3.1 cm. No mass or microlithiasis visualized. Left testicle Measurements: 4.7 x 2.3 x 2.1 cm. No mass or microlithiasis visualized. Right epididymis: Enlarged, heterogeneous. Complex area in the epididymal head measuring up  to 1.2 cm. Left epididymis:  Normal in size and appearance. Hydrocele:  None visualized. Varicocele:  None visualized. Pulsed Doppler interrogation of both testes demonstrates normal low resistance arterial and venous waveforms bilaterally. IMPRESSION: Heterogeneous, enlarged right epididymis may reflect epididymitis. There is a complex hypoechoic area centrally within the epididymis which could reflect complex cyst/spermatocele or abscess. This could be followed after a course of antibiotic treatment. No evidence of testicular mass or torsion. Electronically Signed   By: Charlett Nose M.D.   On: 08/30/2019 16:36   ECHOCARDIOGRAM LIMITED  Result Date: 09/12/2019    ECHOCARDIOGRAM LIMITED REPORT   Patient Name:   Alexis Griffith Date of Exam:  09/10/2019 Medical Rec #:  [id number]      Height:       70.0 in Accession #:    [id number]     Weight:       188.5 lb Date of Birth:  [birth year]      BSA:          2.035 m Patient Age:    78 years       BP:           161/72 mmHg Patient Gender: M              HR:           74 bpm. Exam Location:  Inpatient Procedure: Limited Echo, Limited Color Doppler and Cardiac Doppler Indications:    ACS/S/P TAVR 1 month  History:        Patient has prior history of Echocardiogram examinations, most                 recent 08/16/2019.                 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 Procedure Date:                 08/15/2019.  Sonographer:    Ross Ludwig RDCS (AE) Referring Phys: [id number] CARRIEL T NIPP IMPRESSIONS  1. Stable findings 1 month post TAVR, normal function of the bioprosthetic valve, mean transaortic gradient 12 mmHg, no paravalvular leak.  2. Left ventricular ejection fraction, by estimation, is 60 to 65%. The left ventricle has normal function. The left ventricle has no regional wall motion abnormalities. There is moderate concentric left ventricular hypertrophy. Left ventricular diastolic parameters are consistent with Grade I diastolic dysfunction (impaired relaxation). Elevated left atrial pressure.  3. Right ventricular systolic function is normal. The right ventricular size is normal.  4. The mitral valve is normal in structure. Trivial mitral valve regurgitation. No evidence of mitral stenosis.  5. The aortic valve has been repaired/replaced. Aortic valve regurgitation is not visualized. No aortic stenosis is present. 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 Procedure Date: 08/15/2019. Echo findings  are consistent with normal structure and function of the aortic valve prosthesis.  6. The inferior vena cava is normal in size with greater than 50% respiratory variability, suggesting  right atrial pressure of 3 mmHg. FINDINGS  Left Ventricle: Left ventricular ejection fraction, by estimation, is 60 to 65%. The left ventricle has normal function. The left ventricle has no regional wall motion abnormalities. The left ventricular internal cavity size was normal in size. 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  Elevated left atrial pressure. Right Ventricle: The right ventricular size is normal. No increase in right ventricular wall thickness. Right ventricular systolic function is normal. Left Atrium: Left atrial size was normal in size. Right Atrium: Right atrial size was normal in size. Pericardium: There is no evidence of pericardial effusion. Mitral Valve: The mitral valve is normal in structure. Normal mobility of the mitral valve leaflets. Trivial mitral valve regurgitation. No evidence of mitral valve stenosis. Tricuspid Valve: The tricuspid valve is normal in structure. Tricuspid valve regurgitation is mild . No evidence of tricuspid stenosis. Aortic Valve: The aortic valve has been repaired/replaced. Aortic valve regurgitation is not visualized. No aortic stenosis is present. Aortic valve mean gradient measures 12.0 mmHg. Aortic valve peak gradient measures 23.4 mmHg. Aortic valve area, by VTI measures 1.96 cm. 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 Procedure Date: 08/15/2019. Echo findings are consistent with normal structure and function of the aortic valve prosthesis. Pulmonic Valve: The pulmonic valve was normal in structure. Pulmonic valve regurgitation is not visualized. No evidence of pulmonic stenosis. Aorta: The aortic root is normal in size and structure. Venous: The inferior vena cava is normal in size with greater than 50% respiratory variability, suggesting right atrial pressure of 3 mmHg. IAS/Shunts: No atrial level shunt detected by color flow Doppler.  LEFT VENTRICLE PLAX 2D LVIDd:         3.80 cm   Diastology LVIDs:         3.40 cm  LV e' lateral:   6.90 cm/s LV PW:         1.70 cm  LV E/e' lateral: 8.9 LV IVS:        1.80 cm  LV e' medial:    4.20 cm/s LVOT diam:     2.60 cm  LV E/e' medial:  14.7 LV SV:         87 LV SV Index:   43 LVOT Area:     5.31 cm  IVC IVC diam: 1.60 cm LEFT ATRIUM         Index LA diam:    3.20 cm 1.57 cm/m  AORTIC VALVE AV Area (Vmax):    2.03 cm AV Area (Vmean):   2.11 cm AV Area (VTI):     1.96 cm AV Vmax:           242.00 cm/s AV Vmean:          159.000 cm/s AV VTI:            0.442 m AV Peak Grad:      23.4 mmHg AV Mean Grad:      12.0 mmHg LVOT Vmax:         92.70 cm/s LVOT Vmean:        63.100 cm/s LVOT VTI:          0.163 m LVOT/AV VTI ratio: 0.37  AORTA Ao Root diam: 3.40 cm Ao Asc diam:  3.10 cm MITRAL VALVE MV Area (PHT): 2.37 cm    SHUNTS MV Decel Time: 320 msec    Systemic VTI:  0.16 m MV E velocity: 61.70 cm/s  Systemic Diam: 2.60 cm MV A velocity: 88.30 cm/s MV E/A ratio:  0.70 Ena Dawley MD Electronically signed by Ena Dawley MD Signature Date/Time: 09/12/2019/10:42:31 AM    Final    Disposition   Pt is being discharged home today in good condition.  Follow-up Plans & Appointments  Follow-up Information     Eileen Stanford, PA-C Follow up on 10/16/2019.   Specialties: Cardiology, Radiology Why: You have a post-hospital follow-up appointment with Nell Range, PA-C on Monday October 16, 2019 at 2:30 PM (Arrive by 2:15 PM). Contact information: [address] [place] [address] [phone]         Alexis Frock, MD Follow up.   Specialty: Urology Why: Keep follow-up as scheduled on 09/25/19 at 12:45pm Contact information: [address] [place] [address] [phone]                Discharge Instructions     Diet - low sodium heart healthy   Complete by: As directed    Diet Carb Modified   Complete by: As directed    Increase activity slowly   Complete by: As directed         Discharge Medications   Allergies as of 09/28/2019       Reactions   Macrodantin [nitrofurantoin] Other (See Comments)   "blocked kidneys"    Tape Other (See Comments)   "Tears my skin and leaves marks." PLEASE USE COBAN!!   Sulfa Antibiotics Rash   Lisinopril Cough   Nutritional Supplements Other (See Comments)   Protein drink given to me "did something"   Penicillins Other (See Comments)   08/30/19- Patient can't remember, was 35+ years ago. OK with trying cephalexin in hospital        Medication List     STOP taking these medications    Lantus SoloStar 100 UNIT/ML Solostar Pen Generic drug: insulin glargine       TAKE these medications    acetaminophen 325 MG tablet Commonly known as: TYLENOL Take 650 mg by mouth every 6 (six) hours as needed for mild pain or moderate pain.   amLODipine 5 MG tablet Commonly known as: NORVASC Take 1 tablet (5 mg total) by mouth daily.   aspirin 81 MG chewable tablet Chew 1 tablet (81 mg total) by mouth daily.   carboxymethylcellulose 0.5 % Soln Commonly known as: REFRESH PLUS Place 1 drop into both eyes every 12 (twelve) hours as needed (dry eyes).   clonazePAM 0.5 MG tablet Commonly known as: KLONOPIN Take 1 tablet (0.5 mg total) by mouth 4 (four) times daily.   Coenzyme Q10 200 MG capsule Take 200 mg by mouth daily.   Dermacloud Crea Apply 1 application topically See admin instructions. Apply to testicle area every shift   fosfomycin 3 g Pack Commonly known as: MONUROL Take 3 g by mouth every 3 (three) days. Mix 3 grams into 6 ounces of water and drink once daily every 3 days, for 10 days   insulin aspart 100 UNIT/ML injection Commonly known as: novoLOG Inject 0-9 Units into the skin 3 (three) times daily with meals. Correction coverage: Sensitive CBG < 70: Implement Hypoglycemia Standing Orders CBG 70 - 120: 0 units CBG 121 - 150: 1 unit CBG 151 - 200: 2 units CBG 201 - 250: 3 units CBG 251 - 300: 5  units CBG 301 - 350: 7 units CBG 351 - 400: 9 units CBG > 400: call MD What changed:  when to take this additional instructions   insulin aspart 100 UNIT/ML injection Commonly known as: novoLOG Inject 3 Units into the skin 3 (three) times daily with meals. If patient eats >50% of meals. What changed: Another medication with the same name was changed. Make sure you understand how and when to take  each.   isosorbide mononitrate 60 MG 24 hr tablet Commonly known as: IMDUR Take 1 tablet (60 mg total) by mouth daily.   losartan 50 MG tablet Commonly known as: COZAAR Take 1 tablet (50 mg total) by mouth daily. Start taking on: September 29, 2019   magnesium oxide 400 (241.3 Mg) MG tablet Commonly known as: MAG-OX Take 1 tablet (400 mg total) by mouth daily.   metoprolol tartrate 50 MG tablet Commonly known as: LOPRESSOR Take 1 tablet (50 mg total) by mouth 2 (two) times daily. What changed:  medication strength how much to take   nitroGLYCERIN 0.4 MG SL tablet Commonly known as: NITROSTAT Place 1 tablet (0.4 mg total) under the tongue every 5 (five) minutes as needed for chest pain (up to 3 doses). Do not give if blood pressure is low (less than 409 systolic). What changed:  when to take this reasons to take this additional instructions   glucose blood test strip 1 each by Other route as needed.   ONE TOUCH ULTRA TEST test strip Generic drug: glucose blood 1 each by Other route daily.   OneTouch Delica Plus [id number] Misc 1 each by Other route daily.   polyethylene glycol 17 g packet Commonly known as: MIRALAX / GLYCOLAX Take 17 g by mouth daily as needed for mild constipation.   rosuvastatin 20 MG tablet Commonly known as: CRESTOR Take 1 tablet (20 mg total) by mouth daily. What changed: when to take this   Trilipix 135 MG capsule Generic drug: Choline Fenofibrate Take 135 mg by mouth daily.           Outstanding Labs/Studies   If the patient is tolerating  statin at time of follow-up appointment, would consider rechecking liver function/lipid panel in 6-8 weeks.   Duration of Discharge Encounter   Greater than 30 minutes including physician time.  Signed, Rosaria Ferries, PA-C 09/23/2019   Patient seen and examined with Rosaria Ferries PA-C.  Agree as above, with the following exceptions and changes as noted below. . Gen: NAD, CV: RRR, no murmurs, Lungs: clear, Abd: soft, Extrem: Warm, well perfused, no edema, Neuro/Psych: alert and oriented x 3, normal mood and affect. All available labs, radiology testing, previous records reviewed. Stable for discharge today on above medications. Follow up has been arranged.  Cherlynn Kaiser, MD 09/23/2019  ADDENDUM 09/28/2019:  Patient was initially planned to be discharged back to [facility] on 09/23/2019 as described above. However, insurance authorization was denied. This was appealed and was finally approved today. The only change to above documentation is that Losartan '50mg'$  daily was added today for additional BP control. Would recommend checking BMET in 1 week. Otherwise, no changes to above.  Darreld Mclean, PA-C 09/28/2019 2:05 PM  Attending Note:   The patient was seen and examined.  Agree with assessment and plan as noted above.  Changes made to the above note as needed.  Patient seen and independently examined with Winfred Burn, PA .   We discussed all aspects of the encounter. I agree with the assessment and plan as stated above.   1.  Coronary artery disease: [name] is done well.  Is not had any episodes of chest discomfort.  We will continue with medical therapy. He is generally weak.  He will need to go to a skilled nursing facility for several weeks.  2.  Aortic stenosis: His TAVR valve seems to be functioning normally.  3.  Renal cell carcinoma: Further plans per internal medicine.  I have spent a total of 40 minutes with patient reviewing hospital  notes , telemetry, EKGs, labs and  examining patient as well as establishing an assessment and plan that was discussed with the patient.  > 50% of time was spent in direct patient care.    Thayer Headings, Brooke Bonito., MD, [facility] 09/28/2019, 6:52 PM [address]  [place] Pager [phone]

## 2019-09-22 NOTE — Progress Notes (Signed)
Progress Note  Patient Name: Alexis Griffith Date of Encounter: 09/22/2019  Primary Cardiologist: Mertie Moores, MD / Structural - Sherren Mocha  Subjective   PT again felt better after BM, told his nurse this relieved his chest pressure.   He and I discussed for >30 minutes how stress and anxiety are greatly impacting his physical and mental health. He does not want to leave the hospital, since he feels safer here. We discussed expectations and BP management outside of hospital. I have offered consultation with psychiatry, SW and any other resources we can provide.   Inpatient Medications    Scheduled Meds: . amLODipine  5 mg Oral Daily  . aspirin  81 mg Oral Daily  . Chlorhexidine Gluconate Cloth  6 each Topical Daily  . clonazePAM  0.5 mg Oral QID  . fenofibrate  160 mg Oral Daily  . heparin  5,000 Units Subcutaneous Q8H  . insulin aspart  0-9 Units Subcutaneous TID WC  . insulin aspart  3 Units Subcutaneous TID WC  . isosorbide mononitrate  60 mg Oral Daily  . metoprolol tartrate  50 mg Oral BID  . rosuvastatin  20 mg Oral Daily  . sodium chloride flush  3 mL Intravenous Q12H   Continuous Infusions: . sodium chloride     PRN Meds: sodium chloride, acetaminophen, hydrocortisone cream, nitroGLYCERIN, ondansetron (ZOFRAN) IV, polyvinyl alcohol, sodium chloride flush   Vital Signs    Vitals:   09/21/19 1640 09/21/19 2018 09/22/19 0013 09/22/19 0018  BP: (!) 121/54 (!) 140/58 (!) 128/62   Pulse: 72 77 65   Resp: 19 16 16    Temp: 98.1 F (36.7 C) 98.1 F (36.7 C) 98.1 F (36.7 C)   TempSrc: Oral Oral Oral   SpO2: 94% 93% 99%   Weight:    83.6 kg  Height:        Intake/Output Summary (Last 24 hours) at 09/22/2019 0028 Last data filed at 09/22/2019 0013 Gross per 24 hour  Intake 1320 ml  Output 3600 ml  Net -2280 ml   Filed Weights   09/20/19 1538 09/21/19 0332 09/22/19 0018  Weight: 83.1 kg 83.7 kg 83.6 kg    Physical Exam   GEN: No acute distress.    Neck: No JVD Cardiac: RRR, no murmurs, rubs, or gallops.  Respiratory: Clear to auscultation bilaterally. GI: Soft, nontender, non-distended  MS: No edema; No deformity. Neuro:  Nonfocal  Psych: Normal affect    Labs    Chemistry Recent Labs  Lab 09/19/19 1432 09/19/19 1942 09/20/19 0252  NA 135  --  138  K 4.2  --  3.8  CL 100  --  102  CO2 24  --  25  GLUCOSE 228*  --  187*  BUN 20  --  15  CREATININE 1.08 0.86 0.80  CALCIUM 9.7  --  9.9  GFRNONAA >60 >60 >60  GFRAA >60 >60 >60  ANIONGAP 11  --  11     Hematology Recent Labs  Lab 09/19/19 1432 09/19/19 1942 09/20/19 0252  WBC 7.7 7.8 7.8  RBC 5.10 5.06 5.20  HGB 13.8 13.9 14.4  HCT 43.4 42.4 44.1  MCV 85.1 83.8 84.8  MCH 27.1 27.5 27.7  MCHC 31.8 32.8 32.7  RDW 14.3 14.3 14.4  PLT 143* 143* 144*    Cardiac EnzymesNo results for input(s): TROPONINI in the last 168 hours. No results for input(s): TROPIPOC in the last 168 hours.   BNPNo results for input(s):  BNP, PROBNP in the last 168 hours.   DDimer No results for input(s): DDIMER in the last 168 hours.   Radiology    No results found. Telemetry    09/20/19 NSR- Personally Reviewed  ECG    No new tracing as of 09/20/19- Personally Reviewed  Cardiac Studies   2D echo 09/10/19  1. Stable findings 1 month post TAVR, normal function of the  bioprosthetic valve, mean transaortic gradient 12 mmHg, no paravalvular  leak.  2. Left ventricular ejection fraction, by estimation, is 60 to 65%. The  left ventricle has normal function. The left ventricle has no regional  wall motion abnormalities. There is moderate concentric left ventricular  hypertrophy. Left ventricular  diastolic parameters are consistent with Grade I diastolic dysfunction  (impaired relaxation). Elevated left atrial pressure.  3. Right ventricular systolic function is normal. The right ventricular  size is normal.  4. The mitral valve is normal in structure. Trivial mitral  valve  regurgitation. No evidence of mitral stenosis.  5. The aortic valve has been repaired/replaced. Aortic valve  regurgitation is not visualized. No aortic stenosis is present. There is a  26 mm Edwards Sapien prosthetic (TAVR) valve present in the aortic  position. Procedure Date: 08/15/2019. Echo findings  are consistent with normal structure and function of the aortic valve  prosthesis.  6. The inferior vena cava is normal in size with greater than 50%  respiratory variability, suggesting right atrial pressure of 3 mmHg.   LHC 07/2019  Ost LM lesion is 30% stenosed.  Prox LAD to Mid LAD lesion is 70% stenosed with 70% stenosed side branch in 2nd Diag. Both vessels positive by CT FFR  Mid LAD to Dist LAD lesion is 40% stenosed. Dist LAD lesion is 75% stenosed.  Prox Cx lesion is 45% stenosed. Prox Cx to Mid Cx lesion is 75% stenosed. RFR positive is 0.87  Mid Cx lesion is 5% stenosed with 50% stenosed side branch in 3rd Mrg. No significant drop in RFR reading beyond this lesion.  Ost RCA to Dist RCA lesion is 100% stenosed. Significant left to right collaterals fill the PDA and PL system.  RPDA lesion is 50% stenosed.  --------------------------------------  The left ventricular systolic function is normal. The left ventricular ejection fraction is 55-65% by visual estimate. Regional wall motion knowledged and inferior wall.  LV end diastolic pressure is moderately elevated.  There is ~SEVERE AORTIC VALVE STENOSIS.   SUMMARY  Severe three-vessel disease: 100% CTO of ostial RCA (left to right collaterals fill PDA and PL), long 70% calcified proximal to mid LAD (positive by CT FFR), segmental ~75% proximal-mid LCx (positive by RFR)  Borderline SEVERE AORTIC STENOSIS-mean gradient estimated 37 mmHg by pullback.  Normal LVEF with basal inferior hypokinesis seen on echocardiogram and LV gram.   Plan will be to consult CVTS/Cardiology Valve Clinic to consider  options between two-vessel PCI and TAVR versus CABG/AVR. He is not having resting chest pain, will discharge home today on Imdur pending Valve Clinic evaluation..  Patient Profile     79 y.o. 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  Assessment & Plan    1. Chest pain with known multivessel CAD per cath: -Recent hospitalization with NSTEMI with hsT peaked at 3124 -Limited echo 09/10/19 with  normal LVEF with plans to manage medically due to complex ongoing issue with renal mass and recent hematuria. -hsTroponin low-level at 23>>28>>35 with no recurrent symptoms  -BP improved since admission -Plan to continue with BP control given significantly elevated BP on presentation -Metoprolol increased 7/20 -BP stable now   2. HTN: -Improved on amlodipine 5, IMDUR 60, metoprolol 50 mg BID -may need prn hydralazine at [facility] for HTN  3. 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 Renal cell carcinoma with recent urinary retention requiring foley, hematuria and UTI/epididymoorchitis (completed course of fosfamycin last admission): -Continue foley in place, no recent hematuria noted per patient -Per last admission's discussion, Dr. Zettie Pho office to reach out to patient to r/s -Will need to ensure concrete plan for follow-up at discharge -Also with pulmonary nodules that will need to be followed over time  5. DM2: - SSI for glucose control whole inpatient status   6. Carotid artery disease: -MRI last admission showed4 mm acute infarct left medial frontalcortex, which was felt to be incidentaland procedural related. -CTA of the head and  neck show moderate 65 to 75% bilateral ICA stenoses -Provide continued surveillance and outpatient follow-up  7. Hyperlipidemia with goal LDL <70 given CAD: -Last LDL, 76 on 7/120/21  -Continue rosuvastatin>>recently titrated  8. Constipation: - frequent issue for him, symptoms reliably relieved with bowel movement.  -needs to be on a daily bowel regimen to mitigate this issue.  9. Anxiety - he is greatly troubled with anxiety and I strongly believe he would benefit from CBT and counseling/psychiatry support. This should be arranged by PCP, I have offered inpatient services which patient has deferred.  Elouise Munroe, MD

## 2019-09-22 NOTE — TOC Initial Note (Signed)
Transition of Care [facility]) - Initial/Assessment Note    Patient Details  Name: Alexis Griffith MRN: [id number] Date of Birth: May 31, 1940  [facility] CM/SW Contact:    Emeterio Reeve, [place] Phone Number: 09/22/2019, 11:32 AM  Clinical Narrative:                  CSW met with pt at bedside. CSW introduced self and explained her role at the hospital.  Pt stated he has been between the hospital and [facility] since July 1st. Pt states prior to that he was living at home with his wife where was independent.   CSW reviewed pt/ot reccs with pt. Pt states that plans on returning to [facility]. CSW called [facility] pg and they are able to take pt back to facility when ready.   CSW started insurance auth and will continue to follow.   Expected Discharge Plan: Skilled Nursing Facility Barriers to Discharge: Continued Medical Work up   Patient Goals and CMS Choice Patient states their goals for this hospitalization and ongoing recovery are:: To return to [facility] and eventually go back home CMS Medicare.gov Compare Post Acute Care list provided to:: Patient Choice offered to / list presented to : Patient  Expected Discharge Plan and Services Expected Discharge Plan: [place] arrangements for the past 2 months: [place]                                      Prior Living Arrangements/Services Living arrangements for the past 2 months: [place] Lives with:: Facility Resident Patient language and need for interpreter reviewed:: Yes Do you feel safe going back to the place where you live?: Yes      Need for Family Participation in Patient Care: Yes (Comment) Care giver support system in place?: Yes (comment)      Activities of Daily Living Home Assistive Devices/Equipment: Environmental consultant (specify type), Wheelchair ADL Screening (condition at time of admission) Patient's cognitive ability adequate to safely complete daily  activities?: Yes Is the patient deaf or have difficulty hearing?: No Does the patient have difficulty seeing, even when wearing glasses/contacts?: No Does the patient have difficulty concentrating, remembering, or making decisions?: No Patient able to express need for assistance with ADLs?: Yes Does the patient have difficulty dressing or bathing?: Yes Independently performs ADLs?: Yes (appropriate for developmental age) Does the patient have difficulty walking or climbing stairs?: No Weakness of Legs: Right Weakness of Arms/Hands: None  Permission Sought/Granted Permission sought to share information with : Facility Sport and exercise psychologist, Family Supports    Share Information with NAME: [name]  Permission granted to share info w AGENCY: SNF  Permission granted to share info w Relationship: Spouse  Permission granted to share info w Contact Information: [phone]  Emotional Assessment Appearance:: Appears stated age Attitude/Demeanor/Rapport: Engaged Affect (typically observed): Appropriate Orientation: : Oriented to Self, Oriented to Place, Oriented to  Time, Oriented to Situation Alcohol / Substance Use: Not Applicable Psych Involvement: No (comment)  Admission diagnosis:  Chest pain [R07.9] Chest pain, unspecified type [R07.9] Patient Active Problem List   Diagnosis Date Noted  . Premature atrial contractions 09/12/2019  . Orthostatic hypotension 09/11/2019  . Urinary retention 09/11/2019  . Mild anemia 09/11/2019  . Hypomagnesemia 09/11/2019  . NSTEMI (non-ST elevated myocardial infarction) ([place]) 09/09/2019  . Elevated troponin   . Chest pain   .  Hypertensive urgency   . Acute on chronic diastolic heart failure ([place]) 08/15/2019  . 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 Diabetes mellitus type 2 in nonobese (HCC)   . Severe aortic stenosis   . Sleep apnea   . Renal mass   . Coronary artery disease involving native coronary artery of native  heart with angina pectoris ([place]) 07/04/2019  . 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 Excessive urination at night 06/05/2013  . TIA (transient ischemic attack) 07/27/2012  . Giant cell arteritis ([place]) 05/17/2012  . Carotid artery disease ([place]) 08/24/2011  . PVC (premature ventricular contraction)   . Hypertension   . Hyperlipidemia LDL goal <70   . Anxiety   . Sinus bradycardia on ECG    PCP:  Tisovec, Fransico Him, MD Pharmacy:   [facility], [address], [address] [place], [place] [address] Phone: [phone] Fax: [place], [place] [address] Phone: [phone] Fax: [phone]     Social Determinants of Health (SDOH) Interventions    Readmission Risk Interventions No flowsheet data found.  Emeterio Reeve, Latanya Presser, [place] Social Worker [phone]

## 2019-09-22 NOTE — Progress Notes (Addendum)
Progress Note  Patient Name: Alexis Griffith Date of Encounter: 09/22/2019  Primary Cardiologist: Mertie Moores, MD  Subjective   Feeling well today without further CP.  Inpatient Medications    Scheduled Meds: . amLODipine  5 mg Oral Daily  . aspirin  81 mg Oral Daily  . Chlorhexidine Gluconate Cloth  6 each Topical Daily  . clonazePAM  0.5 mg Oral QID  . fenofibrate  160 mg Oral Daily  . heparin  5,000 Units Subcutaneous Q8H  . insulin aspart  0-9 Units Subcutaneous TID WC  . insulin aspart  3 Units Subcutaneous TID WC  . isosorbide mononitrate  60 mg Oral Daily  . magnesium oxide  400 mg Oral Daily  . metoprolol tartrate  50 mg Oral BID  . rosuvastatin  20 mg Oral Daily  . sodium chloride flush  3 mL Intravenous Q12H   Continuous Infusions: . sodium chloride     PRN Meds: sodium chloride, acetaminophen, hydrocortisone cream, nitroGLYCERIN, ondansetron (ZOFRAN) IV, polyethylene glycol, polyvinyl alcohol, sodium chloride flush   Vital Signs    Vitals:   09/22/19 0521 09/22/19 0752 09/22/19 0907 09/22/19 1142  BP: (!) 124/57 (!) 125/62 (!) 173/66 (!) 121/57  Pulse: 71 69 68 63  Resp: 16 18  18   Temp: 98.3 F (36.8 C) 98.6 F (37 C)  97.8 F (36.6 C)  TempSrc: Oral Oral  Oral  SpO2: 94% 95%  96%  Weight:      Height:        Intake/Output Summary (Last 24 hours) at 09/22/2019 1356 Last data filed at 09/22/2019 1151 Gross per 24 hour  Intake 1200 ml  Output 3150 ml  Net -1950 ml   Last 3 Weights 09/22/2019 09/21/2019 09/20/2019  Weight (lbs) 184 lb 4.8 oz 184 lb 9.6 oz 183 lb 3.2 oz  Weight (kg) 83.598 kg 83.734 kg 83.099 kg     Telemetry    NSR - Personally Reviewed  Physical Exam  Examined by MD who will addend below GEN: No acute distress.  HEENT: Normocephalic, atraumatic, sclera non-icteric. Neck: No JVD or bruits. Cardiac: RRR no murmurs, rubs, or gallops.  Radials/DP/PT 1+ and equal bilaterally.  Respiratory: Clear to auscultation  bilaterally. Breathing is unlabored. GI: Soft, nontender, non-distended, BS +x 4. MS: no deformity. Extremities: No clubbing or cyanosis. No edema. Distal pedal pulses are 2+ and equal bilaterally. Neuro:  AAOx3. Follows commands. Psych:  Responds to questions appropriately with a normal affect.  Labs    High Sensitivity Troponin:   Recent Labs  Lab 09/09/19 1446 09/09/19 1659 09/19/19 1432 09/19/19 1632 09/19/19 1942  TROPONINIHS 1,189* 911* 23* 28* 35*      Cardiac EnzymesNo results for input(s): TROPONINI in the last 168 hours. No results for input(s): TROPIPOC in the last 168 hours.   Chemistry Recent Labs  Lab 09/19/19 1432 09/19/19 1942 09/20/19 0252  NA 135  --  138  K 4.2  --  3.8  CL 100  --  102  CO2 24  --  25  GLUCOSE 228*  --  187*  BUN 20  --  15  CREATININE 1.08 0.86 0.80  CALCIUM 9.7  --  9.9  GFRNONAA >60 >60 >60  GFRAA >60 >60 >60  ANIONGAP 11  --  11     Hematology Recent Labs  Lab 09/19/19 1432 09/19/19 1942 09/20/19 0252  WBC 7.7 7.8 7.8  RBC 5.10 5.06 5.20  HGB 13.8 13.9 14.4  HCT 43.4 42.4  44.1  MCV 85.1 83.8 84.8  MCH 27.1 27.5 27.7  MCHC 31.8 32.8 32.7  RDW 14.3 14.3 14.4  PLT 143* 143* 144*    BNPNo results for input(s): BNP, PROBNP in the last 168 hours.   DDimer No results for input(s): DDIMER in the last 168 hours.   Radiology    No results found.  Cardiac Studies   2D echo 09/10/19  1. Stable findings 1 month post TAVR, normal function of the  bioprosthetic valve, mean transaortic gradient 12 mmHg, no paravalvular  leak.  2. Left ventricular ejection fraction, by estimation, is 60 to 65%. The  left ventricle has normal function. The left ventricle has no regional  wall motion abnormalities. There is moderate concentric left ventricular  hypertrophy. Left ventricular  diastolic parameters are consistent with Grade I diastolic dysfunction  (impaired relaxation). Elevated left atrial pressure.  3. Right  ventricular systolic function is normal. The right ventricular  size is normal.  4. The mitral valve is normal in structure. Trivial mitral valve  regurgitation. No evidence of mitral stenosis.  5. The aortic valve has been repaired/replaced. Aortic valve  regurgitation is not visualized. No aortic stenosis is present. There is a  26 mm Edwards Sapien prosthetic (TAVR) valve present in the aortic  position. Procedure Date: 08/15/2019. Echo findings  are consistent with normal structure and function of the aortic valve  prosthesis.  6. The inferior vena cava is normal in size with greater than 50%  respiratory variability, suggesting right atrial pressure of 3 mmHg.    Patient Profile     79 y.o. 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 This is his 3rd admission since June. See H/p for full details. Found to have mildly elevated troponin and labile HTN likely contributing to CP.  Assessment & Plan    1. Chest pain/elevated troponinin the context of known multivessel CAD with recent NSTEMI- patient was recently admitted last time with troponin elevation to 3124, limited echo 09/10/19 with normal LVEF. He has been managed medically due to complex ongoing issue with renal mass and recent hematuria, requiring cessation of Plavix. - hsTroponin low-level at 23->28->35 without recurrent chest pain while admitted, felt to reflect demand process rather than new ACS - labile hypertension felt to be contributing - metoprolol was increased to 50mg  BID - initially he was trialed on increased dose of Imdur but had hypotension so Imdur and amlodipine were continued at home doses - continue conservative management  2. HTN with recent  issues with orthostatic hypotension - followed closely while inpatient in the context of above - if blood pressure is elevated moving forward, may need PRN hydralazine at [facility]   3. Severe AS s/p recent TAVR6/15/21 - has been on ASA alone given hematuria -1 month post-op echo during last admission was stable - already has f/u planned 7/28 with Nell Range PA-C - continue SBE ppx precautions for any dental work  4. Presumed renal cell carcinoma with recent urinary retentionrequiring foley,hematuria and UTI/epididymoorchitis(completed course of fosfamycin last admission) - continue foley in place, no recent hematuria noted per patient - completed fosfomycin course last admission - our team discussed with Dr. Tresa Moore last admission who felt that given admission with NSTEMI and underlying CAD, patient may be less ideal operative candidate and he may pursue conservative  approach. Discussed with patient last admission, advised f/u with urology for more definitive discussions. Dr. Tresa Moore also advised to leave foley in place and indicated he would have his office contact patient to arrange follow-up. Confirmed with [facility] this is still scheduled for 7/26 at 12:45pm - also with pulmonary nodules that will need to be followed over time as outpatient  5. DM type 2 - as with last admission, anticipate continuing Novolog 3 units TIDwc and SSI - Lantus was on home med list this time coming in so may need to resume this as outpatient as well depending on CBGs  6. Carotid artery disease - MRI last admission showed4 mm acute infarct left medial frontalcortex, which was felt to be incidentaland procedural related.CTA of the head and neck show moderate 65 to 75% bilateral ICA stenoses -will require continued surveillance and outpatient follow-up  7. Hyperlipidemia with goal LDL <70 given CAD - rosuvastatin recently titrated, therefore will continue - continue fenofibrate -If  the patient is tolerating statin at time of follow-up appointment, would consider rechecking liver function/lipid panel inabout 4 weeks  8. Constipation - frequent issue for him, symptoms reliably relieved with bowel movement.  -needs to be on a daily bowel regimen to mitigate this issue - Dr. Margaretann Loveless started Miralax daily  9.PVCs  - question whether this contributed to symptoms, beta blocker titrated - Mg 1.7, was low last admission as well, so started on MagOx 400mg  daily  10. Anxiety - patient shared social stressors with MD. He is greatly troubled with anxiety and she strongly believes he would benefit from CBT and counseling/psychiatry support. This should be arranged by PCP. We offered inpatient services which patient has deferred. Will need clonazepam rx printed at d/c to SNF,  Awaiting discharge to SNF when insurance approval. Dr. Margaretann Loveless already signed FL2. INCOMPLETE DISCHARGE SUMMARY PENDING UNDER INCOMPLETE FOR WEEKEND TEAM TO USE.    For questions or updates, please contact [place] Please consult [email or web address] for contact info under Cardiology/STEMI.  Signed, Charlie Pitter, PA-C 09/22/2019, 1:56 PM    Patient seen and examined with Melina Copa PA-C.  Agree as above, with the following exceptions and changes as noted below. Gen: NAD, CV: RRR, no murmurs, Lungs: clear, Abd: soft, Extrem: Warm, well perfused, no edema, Neuro/Psych: alert and oriented x 3, normal mood and affect. All available labs, radiology testing, previous records reviewed. No additional chest pain, in good spirits today.  Medically ready for discharge, awaiting SNF reacceptance. Agree with medication recommendations as above.  Elouise Munroe, MD 09/22/19 4:03 PM

## 2019-09-22 NOTE — Evaluation (Signed)
Physical Therapy Evaluation Patient Details Name: Alexis Griffith MRN: [id number] DOB: 08/14/1940 Today's Date: 09/22/2019   History of Present Illness  Alexis Griffith is a 79 y.o. 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  Pt was d/c to SNF after recent hospitalizatin and was still there. He presented to ED with chest pain. Pt admitted with chest pain with known multivessel CAD.  Clinical Impression  Pt admitted with above diagnosis. Pt was able to ambulate 180' with RW and min guard for safety.  He required assistance for transfers and cues for safe transfer techniques.  Pt had slight drop in BP with standing but asymptomatic.  Pt was at SNF prior to admission with plan to return to continue strengthening.   Pt currently with functional limitations due to the deficits listed below (see PT Problem List). Pt will benefit from skilled PT to increase their independence and safety with mobility to allow discharge to the venue listed below.    Orthostatic BPs  Supine 143/62 and HR 79  Sitting 149/61 and HR 78  Standing  131/58 and HR 86  Standing after 3 min 130/59 and HR 85   Sitting post walk 152/57 and HR 77       Follow Up Recommendations SNF    Equipment Recommendations  None recommended by PT    Recommendations for Other Services       Precautions / Restrictions Precautions Precautions: Fall Precaution Comments: hx orthostatic hypotension Restrictions Weight Bearing Restrictions: No      Mobility  Bed Mobility Overal bed mobility: Needs Assistance Bed Mobility: Supine to Sit;Sit to Supine     Supine to sit: Min assist Sit to supine: Min assist      Transfers Overall transfer level: Needs assistance Equipment  used: Rolling walker (2 wheeled) Transfers: Sit to/from Stand Sit to Stand: Min guard         General transfer comment: cues for safe hand placement  Ambulation/Gait Ambulation/Gait assistance: Min guard Gait Distance (Feet): 180 Feet Assistive device: Rolling walker (2 wheeled) Gait Pattern/deviations: Step-through pattern;Decreased stride length;Trunk flexed     General Gait Details: min guard for slow, mildly unsteady gait, no overt LoB, vc for proximity to [employer]    Modified Rankin (Stroke Patients Only)       Balance Overall balance assessment: Needs assistance Sitting-balance support: Feet supported;No upper extremity supported Sitting balance-Leahy Scale: Good     Standing balance support: No upper extremity supported Standing balance-Leahy Scale: Fair Standing balance comment: could perform static balance without support                             Pertinent Vitals/Pain Pain Assessment: No/denies pain Pain Location: back sore from [place] expects to be discharged to:: Skilled nursing facility Living Arrangements: Spouse/significant other Available Help at Discharge: Family;Available 24 hours/day Type of Home: House Home Access: Stairs to enter   [employer] of Steps: 1 step (in the house) Home Layout: One level   Additional Comments: reports has access to RW , w/c, shower chair, bsc    Prior Function     Gait / Transfers Assistance Needed: Pt  was at [facility] SNF prior to admission due to recent TAVR. At SNF was ambulating without AD but with supervision. Prior to TAVR was completely independent  ADL's / Homemaking Assistance Needed: Pt was completely independent prior to TAVR        Hand Dominance   Dominant Hand: Right    Extremity/Trunk Assessment   Upper Extremity Assessment Upper Extremity Assessment: Overall WFL for tasks assessed    Lower  Extremity Assessment Lower Extremity Assessment: Overall WFL for tasks assessed    Cervical / Trunk Assessment Cervical / Trunk Assessment: Normal  Communication   Communication: No difficulties  Cognition Arousal/Alertness: Awake/alert Behavior During Therapy: WFL for tasks assessed/performed Overall Cognitive Status: Within Functional Limits for tasks assessed                                        General Comments      Exercises     Assessment/Plan    PT Assessment Patient needs continued PT services  PT Problem List Decreased strength;Decreased mobility;Decreased safety awareness;Decreased activity tolerance;Decreased balance;Decreased knowledge of use of DME;Cardiopulmonary status limiting activity       PT Treatment Interventions DME instruction;Therapeutic activities;Gait training;Therapeutic exercise;Patient/family education;Balance training;Stair training;Functional mobility training;Neuromuscular re-education    PT Goals (Current goals can be found in the Care Plan section)  Acute Rehab PT Goals Patient Stated Goal: return to SNF to get stronger (reports to have further surgery on kidneys soon) PT Goal Formulation: With patient Time For Goal Achievement: 10/06/19 Potential to Achieve Goals: Good    Frequency Min 2X/week   Barriers to discharge Decreased caregiver support wife unable to provide physical assist    Co-evaluation               AM-PAC PT "6 Clicks" Mobility  Outcome Measure Help needed turning from your back to your side while in a flat bed without using bedrails?: None Help needed moving from lying on your back to sitting on the side of a flat bed without using bedrails?: A Little Help needed moving to and from a bed to a chair (including a wheelchair)?: A Little Help needed standing up from a chair using your arms (e.g., wheelchair or bedside chair)?: None Help needed to walk in hospital room?: None Help needed climbing  3-5 steps with a railing? : A Little 6 Click Score: 21    End of Session Equipment Utilized During Treatment: Gait belt Activity Tolerance: Patient tolerated treatment well Patient left: with call bell/phone within reach;in bed Nurse Communication: Mobility status PT Visit Diagnosis: Other abnormalities of gait and mobility (R26.89);Muscle weakness (generalized) (M62.81);Other symptoms and signs involving the nervous system (R29.898)    Time: 1010-1043 PT Time Calculation (min) (ACUTE ONLY): 33 min   Charges:   PT Evaluation $PT Eval Moderate Complexity: 1 Mod PT Treatments $Gait Training: 8-22 mins        Abran Richard, PT Acute Rehab Services Pager [phone] [facility] [place] 09/22/2019, 10:55 AM

## 2019-09-22 NOTE — NC FL2 (Signed)
[place] LEVEL OF CARE SCREENING TOOL     IDENTIFICATION  Patient Name: Alexis Griffith Birthdate: 10/26/1940 Sex: male Admission Date (Current Location): 09/19/2019  [facility] and [place] Number:  Herbalist and Address:  [facility] [place], [place] [address] [place], [place] [address]      Provider Number: [id number]  Attending Physician Name and Address:  Elouise Munroe, MD  Relative Name and Phone Number:       Current Level of Care: Hospital Recommended Level of Care: [place] Prior Approval Number:    Date Approved/Denied:   PASRR Number: [id number]  Discharge Plan: Home    Current Diagnoses: Patient Active Problem List   Diagnosis Date Noted  . Premature atrial contractions 09/12/2019  . Orthostatic hypotension 09/11/2019  . Urinary retention 09/11/2019  . Mild anemia 09/11/2019  . Hypomagnesemia 09/11/2019  . NSTEMI (non-ST elevated myocardial infarction) ([place]) 09/09/2019  . Elevated troponin   . Chest pain   . Hypertensive urgency   . Acute on chronic diastolic heart failure ([place]) 08/15/2019  . 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 Diabetes mellitus type 2 in nonobese (HCC)   . Severe aortic stenosis   . Sleep apnea   . Renal mass   . Coronary artery disease involving native coronary artery of native heart with angina pectoris ([place]) 07/04/2019  . 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 Excessive urination at night 06/05/2013  . TIA (transient ischemic attack) 07/27/2012  . Giant cell arteritis ([place]) 05/17/2012  . Carotid artery disease ([place]) 08/24/2011  . PVC (premature ventricular contraction)   . Hypertension   . Hyperlipidemia LDL goal <70   . Anxiety   . Sinus bradycardia on ECG     Orientation RESPIRATION BLADDER Height & Weight     Self, Time, Situation  Normal Continent, Indwelling catheter Weight: 184 lb 4.8 oz (83.6 kg) Height:  5\' 10"  (177.8 cm)   BEHAVIORAL SYMPTOMS/MOOD NEUROLOGICAL BOWEL NUTRITION STATUS      Continent Diet (See discharge summary)  AMBULATORY STATUS COMMUNICATION OF NEEDS Skin   Extensive Assist Verbally Normal                       Personal Care Assistance Level of Assistance  Bathing, Dressing, Feeding Bathing Assistance: Limited assistance Feeding assistance: Independent Dressing Assistance: Maximum assistance     Functional Limitations Info  Sight, Hearing, Speech Sight Info: Adequate Hearing Info: Adequate Speech Info: Adequate    SPECIAL CARE FACTORS FREQUENCY  PT (By licensed PT), OT (By licensed OT)     PT Frequency: 5x a week OT Frequency: 5x a week            Contractures Contractures Info: Not present    Additional Factors Info  Code Status, Allergies Code Status Info: Full Allergies Info: Macrodantin, Sulfa Antibiotics, Lisinopril, penicillins           Current Medications (09/22/2019):  This is the current hospital active medication list Current Facility-Administered Medications  Medication Dose Route Frequency Provider Last Rate Last Admin  . 0.9 %  sodium chloride infusion  250 mL Intravenous PRN Dunn, Dayna N, PA-C      . acetaminophen (TYLENOL) tablet 500-1,000 mg  500-1,000 mg Oral Q6H PRN Dunn, Dayna N, PA-C      . amLODipine (NORVASC) tablet 5 mg  5 mg Oral Daily Cherlynn Kaiser A, MD   5 mg at 09/22/19 0909  .  aspirin chewable tablet 81 mg  81 mg Oral Daily Dunn, Dayna N, PA-C   81 mg at 09/22/19 6629  . Chlorhexidine Gluconate Cloth 2 % PADS 6 each  6 each Topical Daily Elouise Munroe, MD   6 each at 09/22/19 1115  . clonazePAM (KLONOPIN) tablet 0.5 mg  0.5 mg Oral QID Dunn, Dayna N, PA-C   0.5 mg at 09/22/19 0908  . fenofibrate tablet 160 mg  160 mg Oral Daily Charlie Pitter, PA-C   160 mg at 09/22/19 0908  . heparin injection 5,000 Units  5,000 Units Subcutaneous Q8H Dunn, Dayna N, PA-C   5,000 Units at 09/22/19 4765  . hydrocortisone cream 1 %   Topical  PRN Cherlynn Kaiser A, MD      . insulin aspart (novoLOG) injection 0-9 Units  0-9 Units Subcutaneous TID WC Dunn, Dayna N, PA-C   2 Units at 09/22/19 1157  . insulin aspart (novoLOG) injection 3 Units  3 Units Subcutaneous TID WC Dunn, Dayna N, PA-C   3 Units at 09/22/19 1158  . isosorbide mononitrate (IMDUR) 24 hr tablet 60 mg  60 mg Oral Daily Cherlynn Kaiser A, MD   60 mg at 09/22/19 0908  . metoprolol tartrate (LOPRESSOR) tablet 50 mg  50 mg Oral BID Dunn, Dayna N, PA-C   50 mg at 09/22/19 0909  . nitroGLYCERIN (NITROSTAT) SL tablet 0.4 mg  0.4 mg Sublingual Q5 min PRN Dunn, Dayna N, PA-C   0.4 mg at 09/19/19 1631  . ondansetron (ZOFRAN) injection 4 mg  4 mg Intravenous Q6H PRN Dunn, Dayna N, PA-C      . polyethylene glycol (MIRALAX / GLYCOLAX) packet 17 g  17 g Oral Daily PRN Cherlynn Kaiser A, MD      . polyvinyl alcohol (LIQUIFILM TEARS) 1.4 % ophthalmic solution 1 drop  1 drop Both Eyes BID PRN Dunn, Dayna N, PA-C      . rosuvastatin (CRESTOR) tablet 20 mg  20 mg Oral Daily Dunn, Dayna N, PA-C   20 mg at 09/22/19 0910  . sodium chloride flush (NS) 0.9 % injection 3 mL  3 mL Intravenous Q12H Dunn, Dayna N, PA-C   3 mL at 09/22/19 0910  . sodium chloride flush (NS) 0.9 % injection 3 mL  3 mL Intravenous PRN Charlie Pitter, PA-C         Discharge Medications: Please see discharge summary for a list of discharge medications.  Relevant Imaging Results:  Relevant Lab Results:   Additional Information [phone]  Emeterio Reeve, [place]

## 2019-09-23 DIAGNOSIS — R079 Chest pain, unspecified: Secondary | ICD-10-CM | POA: Diagnosis not present

## 2019-09-23 LAB — GLUCOSE, CAPILLARY
Glucose-Capillary: 152 mg/dL — ABNORMAL HIGH (ref 70–99)
Glucose-Capillary: 204 mg/dL — ABNORMAL HIGH (ref 70–99)
Glucose-Capillary: 219 mg/dL — ABNORMAL HIGH (ref 70–99)
Glucose-Capillary: 188 mg/dL — ABNORMAL HIGH (ref 70–99)

## 2019-09-23 LAB — BASIC METABOLIC PANEL
Anion gap: 10 (ref 5–15)
BUN: 15 mg/dL (ref 8–23)
CO2: 23 mmol/L (ref 22–32)
Calcium: 9.4 mg/dL (ref 8.9–10.3)
Chloride: 104 mmol/L (ref 98–111)
Creatinine, Ser: 0.81 mg/dL (ref 0.61–1.24)
GFR calc Af Amer: >60 mL/min (ref >60)
GFR calc non Af Amer: >60 mL/min (ref >60)
Glucose, Bld: 142 mg/dL — ABNORMAL HIGH (ref 70–99)
Potassium: 4.1 mmol/L (ref 3.5–5.1)
Sodium: 137 mmol/L (ref 135–145)

## 2019-09-23 LAB — MAGNESIUM: Magnesium: 1.7 mg/dL (ref 1.7–2.4)

## 2019-09-23 LAB — SARS CORONAVIRUS 2 BY RT PCR (HOSPITAL ORDER, PERFORMED IN ~~LOC~~ HOSPITAL LAB): SARS Coronavirus 2: NEGATIVE

## 2019-09-23 MED ORDER — MAGNESIUM OXIDE 400 (241.3 MG) MG PO TABS: 400.0000 mg | ORAL_TABLET | Freq: Every day | ORAL | 3 refills | Status: DC

## 2019-09-23 MED ORDER — METOPROLOL TARTRATE 50 MG PO TABS: 50.0000 mg | ORAL_TABLET | Freq: Two times a day (BID) | ORAL | 6 refills | Status: DC

## 2019-09-23 MED ORDER — CLONAZEPAM 0.5 MG PO TABS: 0.5000 mg | ORAL_TABLET | Freq: Four times a day (QID) | ORAL | 0 refills | Status: DC

## 2019-09-23 MED ORDER — POLYETHYLENE GLYCOL 3350 17 G PO PACK: 17.0000 g | PACK | Freq: Every day | ORAL | 0 refills | Status: DC | PRN

## 2019-09-23 NOTE — TOC Progression Note (Addendum)
Transition of Care [facility]) - Progression Note    Patient Details  Name: Alexis Griffith MRN: [id number] Date of Birth: 08/13/1940  Transition of Care [facility]) CM/SW [place], LCSW Phone Number: [phone] 09/23/2019, 10:16 AM  Clinical Narrative:    CSW reached out to [place] regarding insurance auth. It is still pending.   4:00pm- auth still pending.  TOC team will continue to assist with discharge planning needs.   Expected Discharge Plan: [place] Barriers to Discharge: Continued Medical Work up  Expected Discharge Plan and Services Expected Discharge Plan: [place] arrangements for the past 2 months: [place] Expected Discharge Date: 09/23/19                                     Social Determinants of Health (SDOH) Interventions    Readmission Risk Interventions No flowsheet data found.

## 2019-09-23 NOTE — Plan of Care (Signed)

## 2019-09-24 DIAGNOSIS — R079 Chest pain, unspecified: Secondary | ICD-10-CM | POA: Diagnosis not present

## 2019-09-24 LAB — GLUCOSE, CAPILLARY
Glucose-Capillary: 155 mg/dL — ABNORMAL HIGH (ref 70–99)
Glucose-Capillary: 159 mg/dL — ABNORMAL HIGH (ref 70–99)
Glucose-Capillary: 169 mg/dL — ABNORMAL HIGH (ref 70–99)
Glucose-Capillary: 181 mg/dL — ABNORMAL HIGH (ref 70–99)

## 2019-09-24 NOTE — Progress Notes (Signed)
MD, Pt was concerned with his sleep apnea, and wanted to have a sleep study set up to assess this, please address, thanks [place].

## 2019-09-24 NOTE — TOC Progression Note (Signed)
Transition of Care [facility]) - Progression Note    Patient Details  Name: Alexis Griffith MRN: [id number] Date of Birth: 11-05-1940  Transition of Care [facility]) CM/SW [place], [place] Phone Number: [phone] 09/24/2019, 8:40 AM  Clinical Narrative:     CSW checked authorization and it is stipp pending.  TOC team will continue to assist with discharge planning needs.  Expected Discharge Plan: [place] Barriers to Discharge: Continued Medical Work up  Expected Discharge Plan and Services Expected Discharge Plan: [place] arrangements for the past 2 months: [place] Expected Discharge Date: 09/23/19                                     Social Determinants of Health (SDOH) Interventions    Readmission Risk Interventions No flowsheet data found.

## 2019-09-24 NOTE — Plan of Care (Signed)
  Problem: Activity: Goal: Risk for activity intolerance will decrease Outcome: Completed/Met   Problem: Nutrition: Goal: Adequate nutrition will be maintained Outcome: Completed/Met   Problem: Coping: Goal: Level of anxiety will decrease Outcome: Completed/Met   Problem: Elimination: Goal: Will not experience complications related to bowel motility Outcome: Completed/Met Goal: Will not experience complications related to urinary retention Outcome: Completed/Met   Problem: Pain Managment: Goal: General experience of comfort will improve Outcome: Completed/Met   Problem: Safety: Goal: Ability to remain free from injury will improve Outcome: Completed/Met   Problem: Skin Integrity: Goal: Risk for impaired skin integrity will decrease Outcome: Completed/Met

## 2019-09-24 NOTE — Progress Notes (Signed)
Progress Note  Patient Name: Alexis Griffith Date of Encounter: 09/24/2019  Primary Cardiologist: Mertie Moores, MD    Patient Profile     79 y.o. 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 This is his 3rd admission since June. See H/p for full details. Found to have mildly elevated troponin and labile HTN likely contributing to CP. Felt 2/2 anxiety  Subjective   No chest pain or sob Waiting NHP and efforts underway for insurance  No more orthostasis   Inpatient Medications    Scheduled Meds: . amLODipine  5 mg Oral Daily  . aspirin  81 mg Oral Daily  . Chlorhexidine Gluconate Cloth  6 each Topical Daily  . clonazePAM  0.5 mg Oral QID  . fenofibrate  160 mg Oral Daily  . heparin  5,000 Units Subcutaneous Q8H  . insulin aspart  0-9 Units Subcutaneous TID WC  . insulin aspart  3 Units Subcutaneous TID WC  . isosorbide mononitrate  60 mg Oral Daily  . magnesium oxide  400 mg Oral Daily  . metoprolol tartrate  50 mg Oral BID  . rosuvastatin  20 mg Oral Daily  . sodium chloride flush  3 mL Intravenous Q12H   Continuous Infusions: . sodium chloride     PRN Meds: sodium chloride, acetaminophen, hydrocortisone cream, nitroGLYCERIN, ondansetron (ZOFRAN) IV, polyethylene glycol, polyvinyl alcohol, sodium chloride flush   Vital Signs    Vitals:   09/23/19 2001 09/24/19 0500 09/24/19 0632 09/24/19 1146  BP: (!) 148/61 (!) 123/60  (!) 137/53  Pulse: 69   57  Resp: 17 18  16   Temp: 97.8 F (36.6 C) (!) 97.4 F (36.3 C)  97.7 F (36.5 C)  TempSrc: Oral Oral  Oral  SpO2: 96% 95%  97%  Weight:   82.9 kg   Height:        Intake/Output Summary (Last 24 hours) at 09/24/2019 1228 Last data filed at 09/24/2019  1147 Gross per 24 hour  Intake 800 ml  Output 4875 ml  Net -4075 ml   Last 3 Weights 09/24/2019 09/23/2019 09/22/2019  Weight (lbs) 182 lb 12.8 oz 184 lb 14.4 oz 184 lb 4.8 oz  Weight (kg) 82.918 kg 83.87 kg 83.598 kg     Telemetry    Sinus  - Personally Reviewed  Physical Exam  Well developed and nourished in no acute distress HENT normal Neck supple   Clear Regular rate and rhythm, no murmurs or gallops Abd-soft with active BS No Clubbing cyanosis edema Skin-warm and dry A & Oriented  Grossly normal sensory and motor function     Labs    High Sensitivity Troponin:   Recent Labs  Lab 09/09/19 1446 09/09/19 1659 09/19/19 1432 09/19/19 1632 09/19/19 1942  TROPONINIHS 1,189* 911* 23* 28* 35*      Cardiac EnzymesNo results for input(s): TROPONINI in the last 168 hours. No results for input(s): TROPIPOC in the last 168 hours.   Chemistry Recent Labs  Lab 09/19/19 1432 09/19/19 1432 09/19/19 1942 09/20/19 0252 09/23/19 0716  NA 135  --   --  138 137  K 4.2  --   --  3.8 4.1  CL 100  --   --  102 104  CO2 24  --   --  25 23  GLUCOSE 228*  --   --  187* 142*  BUN 20  --   --  15 15  CREATININE 1.08   < > 0.86 0.80 0.81  CALCIUM 9.7  --   --  9.9 9.4  GFRNONAA >60   < > >60 >60 >60  GFRAA >60   < > >60 >60 >60  ANIONGAP 11  --   --  11 10   < > = values in this interval not displayed.     Hematology Recent Labs  Lab 09/19/19 1432 09/19/19 1942 09/20/19 0252  WBC 7.7 7.8 7.8  RBC 5.10 5.06 5.20  HGB 13.8 13.9 14.4  HCT 43.4 42.4 44.1  MCV 85.1 83.8 84.8  MCH 27.1 27.5 27.7  MCHC 31.8 32.8 32.7  RDW 14.3 14.3 14.4  PLT 143* 143* 144*    BNPNo results for input(s): BNP, PROBNP in the last 168 hours.   DDimer No results for input(s): DDIMER in the last 168 hours.   Radiology    No results found.  Cardiac Studies   2D echo 09/10/19  1. Stable findings 1 month post TAVR, normal function of the  bioprosthetic valve, mean transaortic  gradient 12 mmHg, no paravalvular  leak.  2. Left ventricular ejection fraction, by estimation, is 60 to 65%. The  left ventricle has normal function. The left ventricle has no regional  wall motion abnormalities. There is moderate concentric left ventricular  hypertrophy. Left ventricular  diastolic parameters are consistent with Grade I diastolic dysfunction  (impaired relaxation). Elevated left atrial pressure.  3. Right ventricular systolic function is normal. The right ventricular  size is normal.  4. The mitral valve is normal in structure. Trivial mitral valve  regurgitation. No evidence of mitral stenosis.  5. The aortic valve has been repaired/replaced. Aortic valve  regurgitation is not visualized. No aortic stenosis is present. There is a  26 mm Edwards Sapien prosthetic (TAVR) valve present in the aortic  position. Procedure Date: 08/15/2019. Echo findings  are consistent with normal structure and function of the aortic valve  prosthesis.  6. The inferior vena cava is normal in size with greater than 50%  respiratory variability, suggesting right atrial pressure of 3 mmHg.    Assessment & Plan    Chest pain/elevated troponinin the context of known multivessel CAD with recent NSTEMI   HTN labile with now resolved orthostasis   Severe AS s/p recent TAVR6/15/21   Presumed renal cell carcinoma with recent urinary retentionrequiring foley,hematuria and    DM type 2   Hyperlipidemia with goal LDL <70 given CAD   Anxiety       Await nursing home placement  Orthostasis resolved  Has urology appt tomorrow and has been harrassed by urology upon prior cancellations, so if still here tomorrow hosp [facility] to [facility]t  BP stable, encouraged to get out of bed

## 2019-09-24 NOTE — Plan of Care (Signed)
  Problem: Education: Goal: Knowledge of General Education information will improve Description: Including pain rating scale, medication(s)/side effects and non-pharmacologic comfort measures Outcome: Progressing   Problem: Health Behavior/Discharge Planning: Goal: Ability to manage health-related needs will improve Outcome: Progressing   Problem: Clinical Measurements: Goal: Ability to maintain clinical measurements within normal limits will improve Outcome: Progressing Goal: Will remain free from infection Outcome: Progressing Goal: Diagnostic test results will improve Outcome: Progressing Goal: Respiratory complications will improve Outcome: Progressing Goal: Cardiovascular complication will be avoided Outcome: Progressing   Problem: Education: Goal: Ability to demonstrate management of disease process will improve Outcome: Progressing Goal: Ability to verbalize understanding of medication therapies will improve Outcome: Progressing Goal: Individualized Educational Video(s) Outcome: Progressing   Problem: Activity: Goal: Capacity to carry out activities will improve Outcome: Progressing   Problem: Cardiac: Goal: Ability to achieve and maintain adequate cardiopulmonary perfusion will improve Outcome: Progressing   

## 2019-09-24 NOTE — TOC Progression Note (Signed)
Transition of Care [facility]) - Progression Note    Patient Details  Name: Alexis Griffith MRN: [id number] Date of Birth: 12/20/1940  Transition of Care [facility]) CM/SW [place], [place] Phone Number: [phone] 09/24/2019, 2:44 PM  Clinical Narrative:     CSW has checked patient's authorization throughout the day and authorization is still pending. CSW was informed that this authorization has been escalated to a MD review for approval.   TOC team will continue to assist with discharge planning needs.    Expected Discharge Plan: [place] Barriers to Discharge: Continued Medical Work up  Expected Discharge Plan and Services Expected Discharge Plan: [place] arrangements for the past 2 months: [place] Expected Discharge Date: 09/23/19                                     Social Determinants of Health (SDOH) Interventions    Readmission Risk Interventions No flowsheet data found.

## 2019-09-24 NOTE — Progress Notes (Addendum)
Progress Note  Patient Name: Alexis Griffith Date of Encounter: 09/24/2019  Primary Cardiologist: Mertie Moores, MD / [place], wants to get back to [facility]. Concerned about his urology appt tomorrow, it has been rescheduled twice already  Inpatient Medications    Scheduled Meds: . amLODipine  5 mg Oral Daily  . aspirin  81 mg Oral Daily  . Chlorhexidine Gluconate Cloth  6 each Topical Daily  . clonazePAM  0.5 mg Oral QID  . fenofibrate  160 mg Oral Daily  . heparin  5,000 Units Subcutaneous Q8H  . insulin aspart  0-9 Units Subcutaneous TID WC  . insulin aspart  3 Units Subcutaneous TID WC  . isosorbide mononitrate  60 mg Oral Daily  . magnesium oxide  400 mg Oral Daily  . metoprolol tartrate  50 mg Oral BID  . rosuvastatin  20 mg Oral Daily  . sodium chloride flush  3 mL Intravenous Q12H   Continuous Infusions: . sodium chloride     PRN Meds: sodium chloride, acetaminophen, hydrocortisone cream, nitroGLYCERIN, ondansetron (ZOFRAN) IV, polyethylene glycol, polyvinyl alcohol, sodium chloride flush   Vital Signs    Vitals:   09/23/19 2001 09/24/19 0500 09/24/19 0632 09/24/19 1146  BP: (!) 148/61 (!) 123/60  (!) 137/53  Pulse: 69   57  Resp: 17 18  16   Temp: 97.8 F (36.6 C) (!) 97.4 F (36.3 C)  97.7 F (36.5 C)  TempSrc: Oral Oral  Oral  SpO2: 96% 95%  97%  Weight:   82.9 kg   Height:        Intake/Output Summary (Last 24 hours) at 09/24/2019 1204 Last data filed at 09/24/2019 1147 Gross per 24 hour  Intake 800 ml  Output 4875 ml  Net -4075 ml   Filed Weights   09/22/19 0018 09/23/19 0516 09/24/19 5366  Weight: 83.6 kg 83.9 kg 82.9 kg    Physical Exam   General: Well developed, well nourished, male in no acute distress Head: Eyes PERRLA, Head normocephalic and atraumatic Lungs: clear bilaterally to auscultation. Heart: HRRR S1 S2, without rub or gallop. No murmur. 4/4 extremity pulses are 2+ & equal. No  JVD. Abdomen: Bowel sounds are present, abdomen soft and non-tender without masses or  hernias noted. Msk: Normal strength and tone for age. Extremities: No clubbing, cyanosis or edema.    Skin:  No rashes or lesions noted. Neuro: Alert and oriented X 3. Psych:  Good affect, responds appropriately  Labs    Chemistry Recent Labs  Lab 09/19/19 1432 09/19/19 1432 09/19/19 1942 09/20/19 0252 09/23/19 0716  NA 135  --   --  138 137  K 4.2  --   --  3.8 4.1  CL 100  --   --  102 104  CO2 24  --   --  25 23  GLUCOSE 228*  --   --  187* 142*  BUN 20  --   --  15 15  CREATININE 1.08   < > 0.86 0.80 0.81  CALCIUM 9.7  --   --  9.9 9.4  GFRNONAA >60   < > >60 >60 >60  GFRAA >60   < > >60 >60 >60  ANIONGAP 11  --   --  11 10   < > = values in this interval not displayed.     Hematology Recent Labs  Lab 09/19/19 1432 09/19/19 1942 09/20/19 0252  WBC 7.7  7.8 7.8  RBC 5.10 5.06 5.20  HGB 13.8 13.9 14.4  HCT 43.4 42.4 44.1  MCV 85.1 83.8 84.8  MCH 27.1 27.5 27.7  MCHC 31.8 32.8 32.7  RDW 14.3 14.3 14.4  PLT 143* 143* 144*    Cardiac EnzymesNo results for input(s): TROPONINI in the last 168 hours. No results for input(s): TROPIPOC in the last 168 hours.   BNPNo results for input(s): BNP, PROBNP in the last 168 hours.   DDimer No results for input(s): DDIMER in the last 168 hours.   Radiology    No results found. Telemetry    SR - Personally Reviewed  ECG    None today- Personally Reviewed  Cardiac Studies   2D echo 09/10/19  1. Stable findings 1 month post TAVR, normal function of the  bioprosthetic valve, mean transaortic gradient 12 mmHg, no paravalvular  leak.  2. Left ventricular ejection fraction, by estimation, is 60 to 65%. The  left ventricle has normal function. The left ventricle has no regional  wall motion abnormalities. There is moderate concentric left ventricular  hypertrophy. Left ventricular  diastolic parameters are consistent with Grade  I diastolic dysfunction  (impaired relaxation). Elevated left atrial pressure.  3. Right ventricular systolic function is normal. The right ventricular  size is normal.  4. The mitral valve is normal in structure. Trivial mitral valve  regurgitation. No evidence of mitral stenosis.  5. The aortic valve has been repaired/replaced. Aortic valve  regurgitation is not visualized. No aortic stenosis is present. There is a  26 mm Edwards Sapien prosthetic (TAVR) valve present in the aortic  position. Procedure Date: 08/15/2019. Echo findings  are consistent with normal structure and function of the aortic valve  prosthesis.  6. The inferior vena cava is normal in size with greater than 50%  respiratory variability, suggesting right atrial pressure of 3 mmHg.   LHC 07/2019  Ost LM lesion is 30% stenosed.  Prox LAD to Mid LAD lesion is 70% stenosed with 70% stenosed side branch in 2nd Diag. Both vessels positive by CT FFR  Mid LAD to Dist LAD lesion is 40% stenosed. Dist LAD lesion is 75% stenosed.  Prox Cx lesion is 45% stenosed. Prox Cx to Mid Cx lesion is 75% stenosed. RFR positive is 0.87  Mid Cx lesion is 5% stenosed with 50% stenosed side branch in 3rd Mrg. No significant drop in RFR reading beyond this lesion.  Ost RCA to Dist RCA lesion is 100% stenosed. Significant left to right collaterals fill the PDA and PL system.  RPDA lesion is 50% stenosed.  --------------------------------------  The left ventricular systolic function is normal. The left ventricular ejection fraction is 55-65% by visual estimate. Regional wall motion knowledged and inferior wall.  LV end diastolic pressure is moderately elevated.  There is ~SEVERE AORTIC VALVE STENOSIS.   SUMMARY  Severe three-vessel disease: 100% CTO of ostial RCA (left to right collaterals fill PDA and PL), long 70% calcified proximal to mid LAD (positive by CT FFR), segmental ~75% proximal-mid LCx (positive by  RFR)  Borderline SEVERE AORTIC STENOSIS-mean gradient estimated 37 mmHg by pullback.  Normal LVEF with basal inferior hypokinesis seen on echocardiogram and LV gram.   Plan will be to consult CVTS/Cardiology Valve Clinic to consider options between two-vessel PCI and TAVR versus CABG/AVR. He is not having resting chest pain, will discharge home today on Imdur pending Valve Clinic evaluation..  Patient Profile     79 y.o. male with recent TAVR 08/15/19 with  complex post-procedural course, DM, HTN, CVA, TIA, sleep apnea, multivessel CAD as above, chronic diastolic CHF, giant cell arteritis, recently diagnosed suspected renal cell carcinoma, carotid artery disease, pulmonary nodules whom we are asked to see for chest pain.  Assessment & Plan    1. Chest pain with known multivessel CAD per cath: -Recent hospitalization with NSTEMI with hsT peaked at 3124 -Limited echo 09/10/19 with normal LVEF with plans to manage medically due to complex ongoing issue with renal mass and recent hematuria. -hsTroponin low-level at 23>>28>>35 with no recurrent symptoms  -BP improved since admission -Plan to continue with BP control given significantly elevated BP on presentation -Metoprolol increased 7/20 -BP stable now   2. HTN: - SBP 111-148 last 24 hr - currently on amlodipine 5, Imdur 60, metoprolol 50 mg BID - follow BP as he gets stronger, can increase amlodipine or add hydralazine  3. Severe AS s/p recent TAVR 08/15/19: - on ASA alone 2nd hematuria - f/u as scheduled  4. Renal cell carcinoma with recent urinary retention requiring foley, hematuria and UTI/epididymoorchitis (completed course of fosfamycin last admission): - currently has foley in place, f/u with Dr Tresa Moore tomorrow as scheduled if possible - no obvious hematuria - pulm nodules need f/u also  5. DM2: - SSI for glucose control whole inpatient status   6. Carotid artery disease: -MRI last admission showed4 mm acute  infarct left medial frontalcortex, which was felt to be incidentaland procedural related. -CTA of the head and neck show moderate 65-75% bilateral ICA stenoses -Provide continued surveillance and outpatient follow-up  7. Hyperlipidemia with goal LDL <70 given CAD: -Last LDL, 76 on 7/120/21  -Continue rosuvastatin>>recently titrated  8. Constipation: - frequent issue for him, symptoms reliably relieved with bowel movement.  -needs to be on a daily bowel regimen to mitigate this issue.  9. Anxiety -  - Dr Margaretann Loveless spoke with him 07/24 about this. He did not want inpatient services - attitude is good today, does not seem anxious. - f/u with PCP   Rosaria Ferries, PA-C

## 2019-09-25 DIAGNOSIS — Z7982 Long term (current) use of aspirin: Secondary | ICD-10-CM | POA: Diagnosis not present

## 2019-09-25 DIAGNOSIS — I248 Other forms of acute ischemic heart disease: Secondary | ICD-10-CM | POA: Diagnosis present

## 2019-09-25 DIAGNOSIS — E785 Hyperlipidemia, unspecified: Secondary | ICD-10-CM | POA: Diagnosis present

## 2019-09-25 DIAGNOSIS — C649 Malignant neoplasm of unspecified kidney, except renal pelvis: Secondary | ICD-10-CM | POA: Diagnosis present

## 2019-09-25 DIAGNOSIS — R918 Other nonspecific abnormal finding of lung field: Secondary | ICD-10-CM | POA: Diagnosis present

## 2019-09-25 DIAGNOSIS — Z794 Long term (current) use of insulin: Secondary | ICD-10-CM | POA: Diagnosis not present

## 2019-09-25 DIAGNOSIS — Z952 Presence of prosthetic heart valve: Secondary | ICD-10-CM | POA: Diagnosis not present

## 2019-09-25 DIAGNOSIS — F419 Anxiety disorder, unspecified: Secondary | ICD-10-CM | POA: Diagnosis present

## 2019-09-25 DIAGNOSIS — I509 Heart failure, unspecified: Secondary | ICD-10-CM

## 2019-09-25 DIAGNOSIS — D696 Thrombocytopenia, unspecified: Secondary | ICD-10-CM | POA: Diagnosis present

## 2019-09-25 DIAGNOSIS — E1151 Type 2 diabetes mellitus with diabetic peripheral angiopathy without gangrene: Secondary | ICD-10-CM | POA: Diagnosis present

## 2019-09-25 DIAGNOSIS — I951 Orthostatic hypotension: Secondary | ICD-10-CM | POA: Diagnosis present

## 2019-09-25 DIAGNOSIS — E669 Obesity, unspecified: Secondary | ICD-10-CM | POA: Diagnosis present

## 2019-09-25 DIAGNOSIS — R079 Chest pain, unspecified: Secondary | ICD-10-CM | POA: Diagnosis present

## 2019-09-25 DIAGNOSIS — I5032 Chronic diastolic (congestive) heart failure: Secondary | ICD-10-CM | POA: Diagnosis present

## 2019-09-25 DIAGNOSIS — I25119 Atherosclerotic heart disease of native coronary artery with unspecified angina pectoris: Secondary | ICD-10-CM | POA: Diagnosis present

## 2019-09-25 DIAGNOSIS — Z888 Allergy status to other drugs, medicaments and biological substances status: Secondary | ICD-10-CM | POA: Diagnosis not present

## 2019-09-25 DIAGNOSIS — I493 Ventricular premature depolarization: Secondary | ICD-10-CM | POA: Diagnosis present

## 2019-09-25 DIAGNOSIS — I252 Old myocardial infarction: Secondary | ICD-10-CM | POA: Diagnosis not present

## 2019-09-25 DIAGNOSIS — Z880 Allergy status to penicillin: Secondary | ICD-10-CM | POA: Diagnosis not present

## 2019-09-25 DIAGNOSIS — I6523 Occlusion and stenosis of bilateral carotid arteries: Secondary | ICD-10-CM | POA: Diagnosis present

## 2019-09-25 DIAGNOSIS — I110 Hypertensive heart disease with heart failure: Secondary | ICD-10-CM | POA: Diagnosis present

## 2019-09-25 DIAGNOSIS — Z6825 Body mass index (BMI) 25.0-25.9, adult: Secondary | ICD-10-CM | POA: Diagnosis not present

## 2019-09-25 DIAGNOSIS — Z882 Allergy status to sulfonamides status: Secondary | ICD-10-CM | POA: Diagnosis not present

## 2019-09-25 DIAGNOSIS — K5900 Constipation, unspecified: Secondary | ICD-10-CM | POA: Diagnosis present

## 2019-09-25 DIAGNOSIS — Z20822 Contact with and (suspected) exposure to covid-19: Secondary | ICD-10-CM | POA: Diagnosis present

## 2019-09-25 LAB — GLUCOSE, CAPILLARY
Glucose-Capillary: 146 mg/dL — ABNORMAL HIGH (ref 70–99)
Glucose-Capillary: 195 mg/dL — ABNORMAL HIGH (ref 70–99)
Glucose-Capillary: 254 mg/dL — ABNORMAL HIGH (ref 70–99)
Glucose-Capillary: 176 mg/dL — ABNORMAL HIGH (ref 70–99)

## 2019-09-25 LAB — SARS CORONAVIRUS 2 (TAT 6-24 HRS): SARS Coronavirus 2: NEGATIVE

## 2019-09-25 NOTE — Progress Notes (Addendum)
Progress Note  Patient Name: Alexis Griffith Date of Encounter: 09/25/2019  Primary Cardiologist: Mertie Moores, MD/ Structural - Sherren Mocha  Subjective   Feeling ok today. Will call and cancel Dr. Tammi Klippel appointment today. Needs PT to get UOOB today    Inpatient Medications    Scheduled Meds: . amLODipine  5 mg Oral Daily  . aspirin  81 mg Oral Daily  . Chlorhexidine Gluconate Cloth  6 each Topical Daily  . clonazePAM  0.5 mg Oral QID  . fenofibrate  160 mg Oral Daily  . heparin  5,000 Units Subcutaneous Q8H  . insulin aspart  0-9 Units Subcutaneous TID WC  . insulin aspart  3 Units Subcutaneous TID WC  . isosorbide mononitrate  60 mg Oral Daily  . magnesium oxide  400 mg Oral Daily  . metoprolol tartrate  50 mg Oral BID  . rosuvastatin  20 mg Oral Daily  . sodium chloride flush  3 mL Intravenous Q12H   Continuous Infusions: . sodium chloride     PRN Meds: sodium chloride, acetaminophen, hydrocortisone cream, nitroGLYCERIN, ondansetron (ZOFRAN) IV, polyethylene glycol, polyvinyl alcohol, sodium chloride flush   Vital Signs    Vitals:   09/24/19 1146 09/24/19 2109 09/25/19 0015 09/25/19 0335  BP: (!) 137/53 (!) 144/56  (!) 123/57  Pulse: 57 67  59  Resp: 16 17  15   Temp: 97.7 F (36.5 C) 97.7 F (36.5 C)  98.2 F (36.8 C)  TempSrc: Oral Oral  Oral  SpO2: 97% 92%  95%  Weight:   83.1 kg   Height:        Intake/Output Summary (Last 24 hours) at 09/25/2019 0903 Last data filed at 09/24/2019 2300 Gross per 24 hour  Intake 1420 ml  Output 4775 ml  Net -3355 ml   Filed Weights   09/23/19 0516 09/24/19 0632 09/25/19 0015  Weight: 83.9 kg 82.9 kg 83.1 kg    Physical Exam   General: Well developed, well nourished, NAD Neck: Negative for carotid bruits. No JVD Lungs:Clear to ausculation bilaterally. No wheezes, rales, or rhonchi. Breathing is unlabored. Cardiovascular: RRR with S1 S2. No murmurs Abdomen: Soft, non-tender, non-distended. No obvious  abdominal masses. Extremities: No edema. Radial pulses 2+ bilaterally Neuro: Alert and oriented. No focal deficits. No facial asymmetry. MAE spontaneously. Psych: Responds to questions appropriately with normal affect.    Labs    Chemistry Recent Labs  Lab 09/19/19 1432 09/19/19 1432 09/19/19 1942 09/20/19 0252 09/23/19 0716  NA 135  --   --  138 137  K 4.2  --   --  3.8 4.1  CL 100  --   --  102 104  CO2 24  --   --  25 23  GLUCOSE 228*  --   --  187* 142*  BUN 20  --   --  15 15  CREATININE 1.08   < > 0.86 0.80 0.81  CALCIUM 9.7  --   --  9.9 9.4  GFRNONAA >60   < > >60 >60 >60  GFRAA >60   < > >60 >60 >60  ANIONGAP 11  --   --  11 10   < > = values in this interval not displayed.     Hematology Recent Labs  Lab 09/19/19 1432 09/19/19 1942 09/20/19 0252  WBC 7.7 7.8 7.8  RBC 5.10 5.06 5.20  HGB 13.8 13.9 14.4  HCT 43.4 42.4 44.1  MCV 85.1 83.8 84.8  MCH 27.1  27.5 27.7  MCHC 31.8 32.8 32.7  RDW 14.3 14.3 14.4  PLT 143* 143* 144*    Cardiac EnzymesNo results for input(s): TROPONINI in the last 168 hours. No results for input(s): TROPIPOC in the last 168 hours.   BNPNo results for input(s): BNP, PROBNP in the last 168 hours.   DDimer No results for input(s): DDIMER in the last 168 hours.   Radiology    No results found.  Telemetry    09/25/19 NSR/SB - Personally Reviewed  ECG    No new tracing as of 09/25/19 - Personally Reviewed  Cardiac Studies   2D echo 09/10/19  1. Stable findings 1 month post TAVR, normal function of the  bioprosthetic valve, mean transaortic gradient 12 mmHg, no paravalvular  leak.  2. Left ventricular ejection fraction, by estimation, is 60 to 65%. The  left ventricle has normal function. The left ventricle has no regional  wall motion abnormalities. There is moderate concentric left ventricular  hypertrophy. Left ventricular  diastolic parameters are consistent with Grade I diastolic dysfunction  (impaired relaxation).  Elevated left atrial pressure.  3. Right ventricular systolic function is normal. The right ventricular  size is normal.  4. The mitral valve is normal in structure. Trivial mitral valve  regurgitation. No evidence of mitral stenosis.  5. The aortic valve has been repaired/replaced. Aortic valve  regurgitation is not visualized. No aortic stenosis is present. There is a  26 mm Edwards Sapien prosthetic (TAVR) valve present in the aortic  position. Procedure Date: 08/15/2019. Echo findings  are consistent with normal structure and function of the aortic valve  prosthesis.  6. The inferior vena cava is normal in size with greater than 50%  respiratory variability, suggesting right atrial pressure of 3 mmHg.   LHC 07/2019  Ost LM lesion is 30% stenosed.  Prox LAD to Mid LAD lesion is 70% stenosed with 70% stenosed side branch in 2nd Diag. Both vessels positive by CT FFR  Mid LAD to Dist LAD lesion is 40% stenosed. Dist LAD lesion is 75% stenosed.  Prox Cx lesion is 45% stenosed. Prox Cx to Mid Cx lesion is 75% stenosed. RFR positive is 0.87  Mid Cx lesion is 5% stenosed with 50% stenosed side branch in 3rd Mrg. No significant drop in RFR reading beyond this lesion.  Ost RCA to Dist RCA lesion is 100% stenosed. Significant left to right collaterals fill the PDA and PL system.  RPDA lesion is 50% stenosed.  --------------------------------------  The left ventricular systolic function is normal. The left ventricular ejection fraction is 55-65% by visual estimate. Regional wall motion knowledged and inferior wall.  LV end diastolic pressure is moderately elevated.  There is ~SEVERE AORTIC VALVE STENOSIS.   SUMMARY  Severe three-vessel disease: 100% CTO of ostial RCA (left to right collaterals fill PDA and PL), long 70% calcified proximal to mid LAD (positive by CT FFR), segmental ~75% proximal-mid LCx (positive by RFR)  Borderline SEVERE AORTIC STENOSIS-mean gradient  estimated 37 mmHg by pullback.  Normal LVEF with basal inferior hypokinesis seen on echocardiogram and LV gram.   Plan will be to consult CVTS/Cardiology Valve Clinic to consider options between two-vessel PCI and TAVR versus CABG/AVR. He is not having resting chest pain, will discharge home today on Imdur pending Valve Clinic   Patient Profile     79 y.o. male with recent TAVR 08/15/19 with complex post-procedural course, DM, HTN, CVA, TIA, sleep apnea, multivessel CAD as above, chronic diastolic CHF,giant cell arteritis,recently  diagnosedsuspectedrenal cell carcinoma,carotid artery disease,pulmonary nodules whom we are asked to see for chest pain.  Assessment & Plan    1. Chest pain with known multivessel CAD per cath: -Recent hospitalization with NSTEMI with hsT peaked at 3124 -Limited echo 09/10/19 with normal LVEF with plans to manage medically due to complex ongoing issue with renal mass and recent hematuria. -hsTroponin low-level at 23>>28>>35 with no recurrent symptoms  -Plan to continue with BP control given significant elevation on presentation -No recurrence   -Continue   2. HTN: -Stable, 168/67>123/57>144/56 -Currently on amlodipine 5, Imdur 60, metoprolol 50 mg BID -Will increase amlodipine to 10 today  -Hold off on up-titration of BB given HR   3. Severe AS s/p recent TAVR6/15/21: -Stable per echo  -Continue ASA alone 2nd hematuria - f/u as scheduled  4. Renal cell carcinoma with recent urinary retentionrequiring foley,hematuria and UTI/epididymoorchitis(completed course of fosfamycin last admission): -Chronic foley in place>>>followed by>>>has f/u with Dr Tresa Moore today>>need to call and cancel appointment -No obvious hematuria -Pulm nodules need f/u also>>concerning for mets   5. DM2: -SSI for glucose control whole inpatient status   6. Carotid artery disease: -MRI last admission showed4 mm acute infarct left medial frontalcortex, which was felt  to be incidentaland procedural related. -CTA of the head and neck show moderate 65-75% bilateral ICA stenoses -Provide continued surveillance and outpatient follow-up  7. Hyperlipidemia with goal LDL <70 given CAD: -Last LDL, 76 on 7/120/21  -Continue rosuvastatin>>recently titrated  8. Constipation: -Needs to be on a daily bowel regimen to mitigate this issue  9. Anxiety: -Stable today, no current issues  -f/u with PCP  Signed, Kathyrn Drown NP-C [facility] Pager: [phone] 09/25/2019, 9:03 AM     For questions or updates, please contact   Please consult [email or web address] for contact info under Cardiology/STEMI.  Attending Note:   The patient was seen and examined.  Agree with assessment and plan as noted above.  Changes made to the above note as needed.  Patient seen and independently examined with Kathyrn Drown. NP .   We discussed all aspects of the encounter. I agree with the assessment and plan as stated above.  1.  CAD :  He has severe 3 V cad.  Has not had a PCI yet due to concerns about needing DAPT in the setting of his renal cell CA.   2.  HTN:    BP is back to normal now. .  continue to follow   He is improving . I suspect he will be able to go back to [facility] tomorrow or very soon.     I have spent a total of 40 minutes with patient reviewing hospital  notes , telemetry, EKGs, labs and examining patient as well as establishing an assessment and plan that was discussed with the patient. > 50% of time was spent in direct patient care.    Thayer Headings, Brooke Bonito., MD, [facility] 09/25/2019, 2:05 PM [address],  [place] Pager [phone]

## 2019-09-25 NOTE — TOC Progression Note (Addendum)
Transition of Care [facility]) - Progression Note    Patient Details  Name: Alexis Griffith MRN: [id number] Date of Birth: Jan 19, 1941  Transition of Care [facility]) CM/SW [place], [place] Phone Number: 09/25/2019, 9:17 AM  Clinical Narrative:    5:06p CSW updated by [facility] and informed the insurance authorization has been denied. CSW met with patient bedside to provide the update. CSW informed an appeal can be completed at [phone]. Patient agreed to have the appeal completed, CSW contacted [facility] while bedside with patient and had expedited appeal completed.  CSW contacted [facility] and provided Levada Dy in admissions with the update.  At 1:45p, CSW attempted calling attending to notify of peer to peer needed, unable to make contact. CSW reached out to charge nurse who tried to assist and was informed MD has to complete peer to peer. CSW attempted to page MD again. CSW will continue to follow.  12:15p CSW Miranda informed by [facility] that a peer to peer is needed for insurance authorization. CSW Miranda notified attending, this CSW paged Dr. Acie Fredrickson, awaiting a call back.  9:17a Insurance authorization currently still pending with [employer].  Expected Discharge Plan: [place] Barriers to Discharge: Continued Medical Work up  Expected Discharge Plan and Services Expected Discharge Plan: [place] arrangements for the past 2 months: [place] Expected Discharge Date: 09/23/19                                     Social Determinants of Health (SDOH) Interventions    Readmission Risk Interventions No flowsheet data found.

## 2019-09-25 NOTE — Progress Notes (Signed)
Contacted SW in order to see if patient will be leaving today. Per SW she will inform me if patient has bed today.

## 2019-09-25 NOTE — Plan of Care (Signed)
  Problem: Clinical Measurements: Goal: Ability to maintain clinical measurements within normal limits will improve Outcome: Completed/Met Goal: Will remain free from infection Outcome: Completed/Met Goal: Diagnostic test results will improve Outcome: Completed/Met Goal: Respiratory complications will improve Outcome: Completed/Met   

## 2019-09-26 LAB — GLUCOSE, CAPILLARY
Glucose-Capillary: 155 mg/dL — ABNORMAL HIGH (ref 70–99)
Glucose-Capillary: 189 mg/dL — ABNORMAL HIGH (ref 70–99)
Glucose-Capillary: 187 mg/dL — ABNORMAL HIGH (ref 70–99)
Glucose-Capillary: 179 mg/dL — ABNORMAL HIGH (ref 70–99)

## 2019-09-26 NOTE — Progress Notes (Addendum)
Progress Note  Patient Name: Alexis Griffith Date of Encounter: 09/26/2019  Primary Cardiologist: Mertie Moores, MD/ Structural - Sherren Mocha  Subjective   Pt feeling well today. Ready to be discharged back to [facility]  Inpatient Medications    Scheduled Meds: . amLODipine  5 mg Oral Daily  . aspirin  81 mg Oral Daily  . Chlorhexidine Gluconate Cloth  6 each Topical Daily  . clonazePAM  0.5 mg Oral QID  . fenofibrate  160 mg Oral Daily  . heparin  5,000 Units Subcutaneous Q8H  . insulin aspart  0-9 Units Subcutaneous TID WC  . insulin aspart  3 Units Subcutaneous TID WC  . isosorbide mononitrate  60 mg Oral Daily  . magnesium oxide  400 mg Oral Daily  . metoprolol tartrate  50 mg Oral BID  . rosuvastatin  20 mg Oral Daily  . sodium chloride flush  3 mL Intravenous Q12H   Continuous Infusions: . sodium chloride     PRN Meds: sodium chloride, acetaminophen, hydrocortisone cream, nitroGLYCERIN, ondansetron (ZOFRAN) IV, polyethylene glycol, polyvinyl alcohol, sodium chloride flush   Vital Signs    Vitals:   09/25/19 0911 09/25/19 1138 09/25/19 1939 09/26/19 0553  BP: (!) 168/67 (!) 114/56 (!) 149/58 (!) 139/62  Pulse: 71 82 74 66  Resp:  16 19 20   Temp:  98 F (36.7 C) 98 F (36.7 C) 97.7 F (36.5 C)  TempSrc:  Oral Oral Oral  SpO2: 94% 96% 96% 95%  Weight:    82.7 kg  Height:        Intake/Output Summary (Last 24 hours) at 09/26/2019 0757 Last data filed at 09/26/2019 0557 Gross per 24 hour  Intake 1680 ml  Output 5200 ml  Net -3520 ml   Filed Weights   09/24/19 1937 09/25/19 0015 09/26/19 0553  Weight: 82.9 kg 83.1 kg 82.7 kg    Physical Exam   General: Well developed, well nourished, NAD Neck: Negative for carotid bruits. No JVD Lungs:Clear to ausculation bilaterally. Breathing is unlabored. Cardiovascular: RRR with S1 S2. No murmurs Abdomen: Soft, non-tender, non-distended. No obvious abdominal masses. Extremities: No edema. Radial pulses 2+  bilaterally Neuro: Alert and oriented. No focal deficits. No facial asymmetry. MAE spontaneously. Psych: Responds to questions appropriately with normal affect.    Labs    Chemistry Recent Labs  Lab 09/19/19 1432 09/19/19 1432 09/19/19 1942 09/20/19 0252 09/23/19 0716  NA 135  --   --  138 137  K 4.2  --   --  3.8 4.1  CL 100  --   --  102 104  CO2 24  --   --  25 23  GLUCOSE 228*  --   --  187* 142*  BUN 20  --   --  15 15  CREATININE 1.08   < > 0.86 0.80 0.81  CALCIUM 9.7  --   --  9.9 9.4  GFRNONAA >60   < > >60 >60 >60  GFRAA >60   < > >60 >60 >60  ANIONGAP 11  --   --  11 10   < > = values in this interval not displayed.     Hematology Recent Labs  Lab 09/19/19 1432 09/19/19 1942 09/20/19 0252  WBC 7.7 7.8 7.8  RBC 5.10 5.06 5.20  HGB 13.8 13.9 14.4  HCT 43.4 42.4 44.1  MCV 85.1 83.8 84.8  MCH 27.1 27.5 27.7  MCHC 31.8 32.8 32.7  RDW 14.3 14.3 14.4  PLT 143* 143* 144*    Cardiac EnzymesNo results for input(s): TROPONINI in the last 168 hours. No results for input(s): TROPIPOC in the last 168 hours.   BNPNo results for input(s): BNP, PROBNP in the last 168 hours.   DDimer No results for input(s): DDIMER in the last 168 hours.   Radiology    No results found.  Telemetry    09/26/19 NSR- Personally Reviewed  ECG    No new tracing as of 09/26/19- Personally Reviewed  Cardiac Studies   2D echo 09/10/19  1. Stable findings 1 month post TAVR, normal function of the  bioprosthetic valve, mean transaortic gradient 12 mmHg, no paravalvular  leak.  2. Left ventricular ejection fraction, by estimation, is 60 to 65%. The  left ventricle has normal function. The left ventricle has no regional  wall motion abnormalities. There is moderate concentric left ventricular  hypertrophy. Left ventricular  diastolic parameters are consistent with Grade I diastolic dysfunction  (impaired relaxation). Elevated left atrial pressure.  3. Right ventricular systolic  function is normal. The right ventricular  size is normal.  4. The mitral valve is normal in structure. Trivial mitral valve  regurgitation. No evidence of mitral stenosis.  5. The aortic valve has been repaired/replaced. Aortic valve  regurgitation is not visualized. No aortic stenosis is present. There is a  26 mm Edwards Sapien prosthetic (TAVR) valve present in the aortic  position. Procedure Date: 08/15/2019. Echo findings  are consistent with normal structure and function of the aortic valve  prosthesis.  6. The inferior vena cava is normal in size with greater than 50%  respiratory variability, suggesting right atrial pressure of 3 mmHg.   LHC 07/2019  Ost LM lesion is 30% stenosed.  Prox LAD to Mid LAD lesion is 70% stenosed with 70% stenosed side branch in 2nd Diag. Both vessels positive by CT FFR  Mid LAD to Dist LAD lesion is 40% stenosed. Dist LAD lesion is 75% stenosed.  Prox Cx lesion is 45% stenosed. Prox Cx to Mid Cx lesion is 75% stenosed. RFR positive is 0.87  Mid Cx lesion is 5% stenosed with 50% stenosed side branch in 3rd Mrg. No significant drop in RFR reading beyond this lesion.  Ost RCA to Dist RCA lesion is 100% stenosed. Significant left to right collaterals fill the PDA and PL system.  RPDA lesion is 50% stenosed.  --------------------------------------  The left ventricular systolic function is normal. The left ventricular ejection fraction is 55-65% by visual estimate. Regional wall motion knowledged and inferior wall.  LV end diastolic pressure is moderately elevated.  There is ~SEVERE AORTIC VALVE STENOSIS.   SUMMARY  Severe three-vessel disease: 100% CTO of ostial RCA (left to right collaterals fill PDA and PL), long 70% calcified proximal to mid LAD (positive by CT FFR), segmental ~75% proximal-mid LCx (positive by RFR)  Borderline SEVERE AORTIC STENOSIS-mean gradient estimated 37 mmHg by pullback.  Normal LVEF with basal inferior  hypokinesis seen on echocardiogram and LV gram.   Plan will be to consult CVTS/Cardiology Valve Clinic to consider options between two-vessel PCI and TAVR versus CABG/AVR. He is not having resting chest pain, will discharge home today on Imdur pending Valve Clinic   Patient Profile     79 y.o. male with recent TAVR 08/15/19 with complex post-procedural course, DM, HTN, CVA, TIA, sleep apnea, multivessel CAD as above, chronic diastolic CHF,giant cell arteritis,recently diagnosedsuspectedrenal cell carcinoma,carotid artery disease,pulmonary nodules whom we are asked to see for chest pain.  Assessment & Plan    1. Chest pain with known multivessel CAD per cath: -Recent hospitalization with NSTEMI with hsT peaked at 3124 -Limited echo 09/10/19 with normal LVEF with plans to manage medically due to complex ongoing issue with renal mass and recent hematuria. -hsTroponin low-level at 23>>28>>35 with no recurrent symptoms  -Plan to continue with BP control given significant elevation on presentation -No recurrence  -Continue ASA,  metoprolol, statin  2. HTN: -Stable,  139/62, 149/58, 114/56 -Currently on amlodipine 5, Imdur60, metoprolol 50 mg BID -Will increase amlodipine to 10 today  -Hold off on up-titration of BB given HR   3. Severe AS s/p recent TAVR6/15/21: -Stable per echo  -Continue ASA alone 2nd hematuria - f/u as scheduled  4. Renal cell carcinoma with recent urinary retentionrequiring foley,hematuria and UTI/epididymoorchitis(completed course of fosfamycin last admission): -Chronic foley in place>>>followed by Dr Tresa Moore -No obvious hematuria -Pulm nodules need f/u also>>concerning for mets   5. DM2: -SSI for glucose control whole inpatient status   6. Carotid artery disease: -MRI last admission showed4 mm acute infarct left medial frontalcortex, which was felt to be incidentaland procedural related. -CTA of the head and neck show moderate 65-75%  bilateral ICA stenoses -Provide continued surveillance and outpatient follow-up  7. Hyperlipidemia with goal LDL <70 given CAD: -Last LDL, 76 on 7/120/21  -Continuerosuvastatin>>recently titrated  8. Constipation: -Needs to be on a daily bowel regimen to mitigate this issue  9. Anxiety: -Stable today, no current issues  -f/u with PCP   Signed, Kathyrn Drown NP-C [facility] Pager: [phone] 09/26/2019, 7:57 AM     For questions or updates, please contact   Please consult [email or web address] for contact info under Cardiology/STEMI.  Attending Note:   The patient was seen and examined.  Agree with assessment and plan as noted above.  Changes made to the above note as needed.  Patient seen and independently examined with  Kathyrn Drown, NP.   We discussed all aspects of the encounter. I agree with the assessment and plan as stated above.  1.  CAD:   Has severe CAD .  Fortunately , not having angina   2.   AS . S/p TVR.  Stable   3. HTN:   midly elevated.   Increasing amlodpine to 10 mg a day . Watch for hypotension     I have spent a total of 40 minutes with patient reviewing hospital  notes , telemetry, EKGs, labs and examining patient as well as establishing an assessment and plan that was discussed with the patient. > 50% of time was spent in direct patient care.    Thayer Headings, Brooke Bonito., MD, [facility] 09/26/2019, 9:44 AM [address]  [place] Pager [phone]

## 2019-09-26 NOTE — Progress Notes (Signed)
Physical Therapy Treatment Patient Details Name: Alexis Griffith MRN: [id number] DOB: 1940-07-03 Today's Date: 09/26/2019    History of Present Illness Alexis Griffith is a 79 y.o. 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  Pt was d/c to SNF after recent hospitalizatin and was still there. He presented to ED with chest pain. Pt admitted with chest pain with known multivessel CAD.    PT Comments    Pt received in recliner, eager to participate in therapy. He required min guard assist transfers and min guard assist ambulation 160' with RW. He presents with mildly unsteady gait. Pt fatigues quickly. Returned to recliner at end of session.    Follow Up Recommendations  SNF     Equipment Recommendations  None recommended by PT    Recommendations for Other Services       Precautions / Restrictions Precautions Precautions: Fall Precaution Comments: hx orthostatic hypotension    Mobility  Bed Mobility               General bed mobility comments: Pt received in recliner.  Transfers Overall transfer level: Needs assistance Equipment used: Rolling walker (2 wheeled) Transfers: Sit to/from Stand Sit to Stand: Min guard         General transfer comment: cues for sequencing  Ambulation/Gait Ambulation/Gait assistance: Min guard Gait Distance (Feet): 160 Feet Assistive device: Rolling walker (2 wheeled) Gait Pattern/deviations: Step-through pattern;Decreased stride length Gait velocity: decreased Gait velocity interpretation: <1.31 ft/sec, indicative of household ambulator General Gait Details: mildly unsteady gait requiring min guard assist. Fatigues quickly. HR 80 during ambulation.   Stairs              Wheelchair Mobility    Modified Rankin (Stroke Patients Only)       Balance Overall balance assessment: Needs assistance Sitting-balance support: Feet supported;No upper extremity supported Sitting balance-Leahy Scale: Good     Standing balance support: During functional activity;No upper extremity supported Standing balance-Leahy Scale: Fair Standing balance comment: static stand without support                            Cognition Arousal/Alertness: Awake/alert Behavior During Therapy: WFL for tasks assessed/performed Overall Cognitive Status: Within Functional Limits for tasks assessed                                        Exercises      General Comments        Pertinent Vitals/Pain Pain Assessment: No/denies pain    Home Living                      Prior Function            PT Goals (current goals can now be found in the care plan section) Acute Rehab PT Goals Patient Stated Goal: return to [facility] to get stronger Progress towards PT goals: Progressing toward goals    Frequency    Min 2X/week      PT Plan Current plan remains appropriate    Co-evaluation              AM-PAC PT "6 Clicks" Mobility   Outcome  Measure  Help needed turning from your back to your side while in a flat bed without using bedrails?: None Help needed moving from lying on your back to sitting on the side of a flat bed without using bedrails?: A Little Help needed moving to and from a bed to a chair (including a wheelchair)?: A Little Help needed standing up from a chair using your arms (e.g., wheelchair or bedside chair)?: None Help needed to walk in hospital room?: A Little Help needed climbing 3-5 steps with a railing? : A Little 6 Click Score: 20    End of Session Equipment Utilized During Treatment: Gait belt Activity Tolerance: Patient tolerated treatment well Patient left: in chair;with call bell/phone within  reach Nurse Communication: Mobility status PT Visit Diagnosis: Other abnormalities of gait and mobility (R26.89);Muscle weakness (generalized) (M62.81);Other symptoms and signs involving the nervous system (D89.784)     Time: 7841-2820 PT Time Calculation (min) (ACUTE ONLY): 13 min  Charges:  $Gait Training: 8-22 mins                     Lorrin Goodell, PT  Office # [phone] Pager [phone]    Lorriane Shire 09/26/2019, 12:43 PM

## 2019-09-27 ENCOUNTER — Ambulatory Visit: Payer: Medicare Other | Admitting: Physician Assistant

## 2019-09-27 ENCOUNTER — Other Ambulatory Visit (HOSPITAL_COMMUNITY): Payer: Medicare Other

## 2019-09-27 LAB — GLUCOSE, CAPILLARY
Glucose-Capillary: 149 mg/dL — ABNORMAL HIGH (ref 70–99)
Glucose-Capillary: 228 mg/dL — ABNORMAL HIGH (ref 70–99)
Glucose-Capillary: 170 mg/dL — ABNORMAL HIGH (ref 70–99)
Glucose-Capillary: 167 mg/dL — ABNORMAL HIGH (ref 70–99)

## 2019-09-27 MED ORDER — INSULIN ASPART 100 UNIT/ML ~~LOC~~ SOLN
0.0000 [IU] | Freq: Three times a day (TID) | SUBCUTANEOUS | Status: DC
  Administered 2019-09-28: 1 [IU] via SUBCUTANEOUS
  Administered 2019-09-28: 3 [IU] via SUBCUTANEOUS
  Administered 2019-09-28: 5 [IU] via SUBCUTANEOUS

## 2019-09-27 NOTE — Progress Notes (Signed)
  Mobility Specialist Criteria Algorithm Info.  Mobility Team: [facility] elevated:Self regulated Activity: Ambulated in hall (In recliner chair before and after) Range of motion: Active;All extremities Level of assistance: Standby assist, set-up cues, supervision of patient - no hands on Assistive device: Front wheel walker Minutes sitting in chair:  Minutes stood: 5 minutes Minutes ambulated: 5 minutes Distance ambulated (ft): 360 ft Mobility response: Tolerated well Bed Position: Chair  Received pt in recliner chair, eager and willing to ambulate in hallway. He walked with supervision for 360 ft using the RW and steady gait. Pt had no complaints, now sitting in recliner chair with all needs met.   09/27/2019 3:02 PM

## 2019-09-27 NOTE — Progress Notes (Addendum)
Progress Note  Patient Name: Alexis Griffith Date of Encounter: 09/27/2019  Primary Cardiologist: Mertie Moores, MD   Subjective   Frustrated with discharge delay. No new complaints. Denies SOB, CP, or palpitations  Inpatient Medications    Scheduled Meds: . amLODipine  5 mg Oral Daily  . aspirin  81 mg Oral Daily  . Chlorhexidine Gluconate Cloth  6 each Topical Daily  . clonazePAM  0.5 mg Oral QID  . fenofibrate  160 mg Oral Daily  . heparin  5,000 Units Subcutaneous Q8H  . insulin aspart  0-9 Units Subcutaneous TID WC  . insulin aspart  3 Units Subcutaneous TID WC  . isosorbide mononitrate  60 mg Oral Daily  . magnesium oxide  400 mg Oral Daily  . metoprolol tartrate  50 mg Oral BID  . rosuvastatin  20 mg Oral Daily  . sodium chloride flush  3 mL Intravenous Q12H   Continuous Infusions: . sodium chloride     PRN Meds: sodium chloride, acetaminophen, hydrocortisone cream, nitroGLYCERIN, ondansetron (ZOFRAN) IV, polyethylene glycol, polyvinyl alcohol, sodium chloride flush   Vital Signs    Vitals:   09/26/19 1953 09/27/19 0602 09/27/19 0943 09/27/19 1151  BP: (!) 140/58 (!) 154/64 (!) 128/61 (!) 145/54  Pulse: 70 62 76 65  Resp: 19 19  18   Temp: 98.2 F (36.8 C) (!) 97.3 F (36.3 C) (!) 97.5 F (36.4 C) (!) 97.5 F (36.4 C)  TempSrc: Oral Oral Oral Oral  SpO2: 95% 93% 98% 97%  Weight:  82.3 kg    Height:        Intake/Output Summary (Last 24 hours) at 09/27/2019 1212 Last data filed at 09/27/2019 1046 Gross per 24 hour  Intake 1080 ml  Output 3501 ml  Net -2421 ml   Filed Weights   09/25/19 0015 09/26/19 0553 09/27/19 0602  Weight: 83.1 kg 82.7 kg 82.3 kg    Telemetry    Sinus rhythm  - Personally Reviewed  ECG    No new tracings - Personally Reviewed  Physical Exam   GEN: Sitting upright in bedside chair eating lunch in no acute distress.   Neck: No JVD, no carotid bruits Cardiac: RRR, no murmurs, rubs, or gallops.  Respiratory: Clear to  auscultation bilaterally, no wheezes/ rales/ rhonchi GI: NABS, Soft, nontender, non-distended  MS: No edema; No deformity. Neuro:  Nonfocal, moving all extremities spontaneously Psych: Normal affect   Labs    Chemistry Recent Labs  Lab 09/23/19 0716  NA 137  K 4.1  CL 104  CO2 23  GLUCOSE 142*  BUN 15  CREATININE 0.81  CALCIUM 9.4  GFRNONAA >60  GFRAA >60  ANIONGAP 10     HematologyNo results for input(s): WBC, RBC, HGB, HCT, MCV, MCH, MCHC, RDW, PLT in the last 168 hours.  Cardiac EnzymesNo results for input(s): TROPONINI in the last 168 hours. No results for input(s): TROPIPOC in the last 168 hours.   BNPNo results for input(s): BNP, PROBNP in the last 168 hours.   DDimer No results for input(s): DDIMER in the last 168 hours.   Radiology    No results found.  Cardiac Studies   2D echo 09/10/19  1. Stable findings 1 month post TAVR, normal function of the  bioprosthetic valve, mean transaortic gradient 12 mmHg, no paravalvular  leak.  2. Left ventricular ejection fraction, by estimation, is 60 to 65%. The  left ventricle has normal function. The left ventricle has no regional  wall motion abnormalities.  There is moderate concentric left ventricular  hypertrophy. Left ventricular  diastolic parameters are consistent with Grade I diastolic dysfunction  (impaired relaxation). Elevated left atrial pressure.  3. Right ventricular systolic function is normal. The right ventricular  size is normal.  4. The mitral valve is normal in structure. Trivial mitral valve  regurgitation. No evidence of mitral stenosis.  5. The aortic valve has been repaired/replaced. Aortic valve  regurgitation is not visualized. No aortic stenosis is present. There is a  26 mm Edwards Sapien prosthetic (TAVR) valve present in the aortic  position. Procedure Date: 08/15/2019. Echo findings  are consistent with normal structure and function of the aortic valve  prosthesis.  6. The  inferior vena cava is normal in size with greater than 50%  respiratory variability, suggesting right atrial pressure of 3 mmHg.   LHC 07/2019  Ost LM lesion is 30% stenosed.  Prox LAD to Mid LAD lesion is 70% stenosed with 70% stenosed side branch in 2nd Diag. Both vessels positive by CT FFR  Mid LAD to Dist LAD lesion is 40% stenosed. Dist LAD lesion is 75% stenosed.  Prox Cx lesion is 45% stenosed. Prox Cx to Mid Cx lesion is 75% stenosed. RFR positive is 0.87  Mid Cx lesion is 5% stenosed with 50% stenosed side branch in 3rd Mrg. No significant drop in RFR reading beyond this lesion.  Ost RCA to Dist RCA lesion is 100% stenosed. Significant left to right collaterals fill the PDA and PL system.  RPDA lesion is 50% stenosed.  --------------------------------------  The left ventricular systolic function is normal. The left ventricular ejection fraction is 55-65% by visual estimate. Regional wall motion knowledged and inferior wall.  LV end diastolic pressure is moderately elevated.  There is ~SEVERE AORTIC VALVE STENOSIS.   SUMMARY  Severe three-vessel disease: 100% CTO of ostial RCA (left to right collaterals fill PDA and PL), long 70% calcified proximal to mid LAD (positive by CT FFR), segmental ~75% proximal-mid LCx (positive by RFR)  Borderline SEVERE AORTIC STENOSIS-mean gradient estimated 37 mmHg by pullback.  Normal LVEF with basal inferior hypokinesis seen on echocardiogram and LV gram.   Plan will be to consult CVTS/Cardiology Valve Clinic to consider options between two-vessel PCI and TAVR versus CABG/AVR. He is not having resting chest pain, will discharge home today on Imdur pending Valve Clinic  Patient Profile     79 y.o. male with recent TAVR 08/15/19 with complex post-procedural course, DM, HTN, CVA, TIA, sleep apnea, multivessel CAD as above, chronic diastolic CHF,giant cell arteritis,recently diagnosedsuspectedrenal cell carcinoma,carotid artery  disease,pulmonary nodules whom we are asked to see for chest pain.   Assessment & Plan    1. Chest pain with known multivessel CAD: recent NSTEMI with HsTrop peak of 3124. Limited echo with normal LVEF with plans to manage medically due to ongoing issues with renal mass and hematuria. HsTrop peaked at 35 this admission and no recurrent symptoms.  - Continue aspirin, statin, and metoprolol  2. HTN: BP intermittently elevated this admission. Amlodipine increased to 10mg  daily - Continue amlodipine, metoprolol, and imdur  3. Severe Aortic stenosis s/p TAVR 08/15/19: stable on limited echo. On aspirin alone given recent hematuria - Continue outpatient follow-up with [facility] 10/16/19.  4. Renal cell carcinoma: c/b recent urinary retention requring foley, hematuria, and UTI/epididymoorchitis s/p fosfomycin. No with chronic indwelling foley followed by Dr. Tresa Moore. Now with pulmonary nodules c/f possible mets - Continue outpatient uro-onc follow-up  5. Carotid artery disease: Moderate on  CTA head/neck - Continue routine outpatient monitoring  6. HLD: LDL 76 09/11/19; goal <70. Crestor recently uptitrated - Continue crestor  7. DM type 2: maintained on ISS this admission - Anticipate resuming home diabetes regimen at discharge  8. Constipation: no complaints today - Continue bowel regimen  9. Anxiety: stable  D/w SW - still awaiting response to appeal submitted to [place] [facility] 09/25/19. Hopeful to discharge soon pending appeal.   For questions or updates, please contact [place] Please consult [email or web address] for contact info under Cardiology/STEMI.      Signed, Abigail Butts, PA-C  09/27/2019, 12:12 PM   [phone]  Attending Note:   The patient was seen and examined.  Agree with assessment and plan as noted above.  Changes made to the above note as needed.  Patient seen and independently examined with Melbourne Abts, PA .   We discussed all aspects of the  encounter. I agree with the assessment and plan as stated above.  1.  CAD :  No further episodes of CP   2.  Hx of AS - s/p TAVR:   Stable.    3.  DM :  Stable    I have spent a total of 40 minutes with patient reviewing hospital  notes , telemetry, EKGs, labs and examining patient as well as establishing an assessment and plan that was discussed with the patient. > 50% of time was spent in direct patient care.    Thayer Headings, Brooke Bonito., MD, [place] 09/27/2019, 1:14 PM [address],  [place] Pager [phone]

## 2019-09-27 NOTE — TOC Progression Note (Signed)
Transition of Care [facility]) - Progression Note    Patient Details  Name: Alexis Griffith MRN: [id number] Date of Birth: 1940/08/27  Transition of Care [facility]) CM/SW [place], [place] Phone Number: 09/27/2019, 3:11 PM  Clinical Narrative:     CSW contacted [facility]- Expedited Appeal still pending-   Thurmond Butts, MSW, [place] Clinical Social Worker   Expected Discharge Plan: Skilled Nursing Facility Barriers to Discharge: Continued Medical Work up  Expected Discharge Plan and Services Expected Discharge Plan: [place]       Living arrangements for the past 2 months: [place] Expected Discharge Date: 09/23/19                                     Social Determinants of Health (SDOH) Interventions    Readmission Risk Interventions No flowsheet data found.

## 2019-09-28 DIAGNOSIS — I25119 Atherosclerotic heart disease of native coronary artery with unspecified angina pectoris: Secondary | ICD-10-CM

## 2019-09-28 DIAGNOSIS — R079 Chest pain, unspecified: Secondary | ICD-10-CM | POA: Diagnosis not present

## 2019-09-28 LAB — GLUCOSE, CAPILLARY
Glucose-Capillary: 147 mg/dL — ABNORMAL HIGH (ref 70–99)
Glucose-Capillary: 235 mg/dL — ABNORMAL HIGH (ref 70–99)
Glucose-Capillary: 152 mg/dL — ABNORMAL HIGH (ref 70–99)

## 2019-09-28 MED ORDER — LOSARTAN POTASSIUM 50 MG PO TABS: 50.0000 mg | ORAL_TABLET | Freq: Every day | ORAL | 2 refills | Status: DC

## 2019-09-28 MED ORDER — LOSARTAN POTASSIUM 50 MG PO TABS
50.0000 mg | ORAL_TABLET | Freq: Every day | ORAL | Status: DC
  Administered 2019-09-28: 50 mg via ORAL
  Filled 2019-09-28: qty 1

## 2019-09-28 NOTE — Progress Notes (Addendum)
Progress Note  Patient Name: Alexis Griffith Date of Encounter: 09/28/2019  [facility] Cardiologist: Mertie Moores, MD  Subjective   No acute overnight events. Patient stable for discharge and we have just been waiting on insurance to approve SNF ([facility]). No new complaints. No chest pain or shortness of breath. He is eager to be discharged.  Inpatient Medications    Scheduled Meds: . amLODipine  5 mg Oral Daily  . aspirin  81 mg Oral Daily  . Chlorhexidine Gluconate Cloth  6 each Topical Daily  . clonazePAM  0.5 mg Oral QID  . fenofibrate  160 mg Oral Daily  . heparin  5,000 Units Subcutaneous Q8H  . insulin aspart  0-9 Units Subcutaneous TID WC  . insulin aspart  3 Units Subcutaneous TID WC  . isosorbide mononitrate  60 mg Oral Daily  . magnesium oxide  400 mg Oral Daily  . metoprolol tartrate  50 mg Oral BID  . rosuvastatin  20 mg Oral Daily  . sodium chloride flush  3 mL Intravenous Q12H   Continuous Infusions: . sodium chloride     PRN Meds: sodium chloride, acetaminophen, hydrocortisone cream, nitroGLYCERIN, ondansetron (ZOFRAN) IV, polyethylene glycol, polyvinyl alcohol, sodium chloride flush   Vital Signs    Vitals:   09/27/19 0943 09/27/19 1151 09/27/19 2005 09/28/19 0442  BP: (!) 128/61 (!) 145/54 (!) 138/55 (!) 144/52  Pulse: 76 65 60 65  Resp:  18 19 19   Temp: (!) 97.5 F (36.4 C) (!) 97.5 F (36.4 C) 97.7 F (36.5 C) (!) 97.5 F (36.4 C)  TempSrc: Oral Oral Oral Oral  SpO2: 98% 97% 95% 96%  Weight:    82.7 kg  Height:        Intake/Output Summary (Last 24 hours) at 09/28/2019 0829 Last data filed at 09/28/2019 0442 Gross per 24 hour  Intake 840 ml  Output 3725 ml  Net -2885 ml   Last 3 Weights 09/28/2019 09/27/2019 09/26/2019  Weight (lbs) 182 lb 4.8 oz 181 lb 6.4 oz 182 lb 4.8 oz  Weight (kg) 82.691 kg 82.283 kg 82.691 kg      Telemetry    Sinus rhythm with rates ranging from the 50's to the 80's. - Personally Reviewed  ECG    No  new ECG tracing. - Personally Reviewed  Physical Exam   GEN: No acute distress.   Neck: No JVD. Cardiac: RRR. No murmurs, rubs, or gallops.  Respiratory: Clear to auscultation bilaterally. No wheezes, rhonchi, or rales. GI: Soft, non-tender, non-distended. Bowel sounds present. MS: No lowe extremity edema. No deformity. Neuro:  No focal deficits. Psych: Normal affect. Responds appropriately.  Labs    High Sensitivity Troponin:   Recent Labs  Lab 09/09/19 1446 09/09/19 1659 09/19/19 1432 09/19/19 1632 09/19/19 1942  TROPONINIHS 1,189* 911* 23* 28* 35*      Chemistry Recent Labs  Lab 09/23/19 0716  NA 137  K 4.1  CL 104  CO2 23  GLUCOSE 142*  BUN 15  CREATININE 0.81  CALCIUM 9.4  GFRNONAA >60  GFRAA >60  ANIONGAP 10     HematologyNo results for input(s): WBC, RBC, HGB, HCT, MCV, MCH, MCHC, RDW, PLT in the last 168 hours.  BNPNo results for input(s): BNP, PROBNP in the last 168 hours.   DDimer No results for input(s): DDIMER in the last 168 hours.   Radiology    No results found.  Cardiac Studies   Left Heart Catheterization 07/07/2019:  Colon Flattery LM lesion is  30% stenosed.  Prox LAD to Mid LAD lesion is 70% stenosed with 70% stenosed side branch in 2nd Diag. Both vessels positive by CT FFR  Mid LAD to Dist LAD lesion is 40% stenosed. Dist LAD lesion is 75% stenosed.  Prox Cx lesion is 45% stenosed. Prox Cx to Mid Cx lesion is 75% stenosed. RFR positive is 0.87  Mid Cx lesion is 5% stenosed with 50% stenosed side branch in 3rd Mrg. No significant drop in RFR reading beyond this lesion.  Ost RCA to Dist RCA lesion is 100% stenosed. Significant left to right collaterals fill the PDA and PL system.  RPDA lesion is 50% stenosed.  --------------------------------------  The left ventricular systolic function is normal. The left ventricular ejection fraction is 55-65% by visual estimate. Regional wall motion knowledged and inferior wall.  LV end diastolic  pressure is moderately elevated.  There is ~SEVERE AORTIC VALVE STENOSIS.    Summary:  Severe three-vessel disease: 100% CTO of ostial RCA (left to right collaterals fill PDA and PL), long 70% calcified proximal to mid LAD (positive by CT FFR), segmental ~75% proximal-mid LCx (positive by RFR)  Borderline SEVERE AORTIC STENOSIS-mean gradient estimated 37 mmHg by pullback.  Normal LVEF with basal inferior hypokinesis seen on echocardiogram and LV gram.   Plan will be to consult CVTS/Cardiology Valve Clinic to consider options between two-vessel PCI and TAVR versus CABG/AVR. He is not having resting chest pain, will discharge home today on Imdur pending Valve Clinic evaluation.   Echocardiogram 09/10/2019: Impressions: 1. Stable findings 1 month post TAVR, normal function of the  bioprosthetic valve, mean transaortic gradient 12 mmHg, no paravalvular  leak.  2. Left ventricular ejection fraction, by estimation, is 60 to 65%. The  left ventricle has normal function. The left ventricle has no regional  wall motion abnormalities. There is moderate concentric left ventricular  hypertrophy. Left ventricular  diastolic parameters are consistent with Grade I diastolic dysfunction  (impaired relaxation). Elevated left atrial pressure.  3. Right ventricular systolic function is normal. The right ventricular  size is normal.  4. The mitral valve is normal in structure. Trivial mitral valve  regurgitation. No evidence of mitral stenosis.  5. The aortic valve has been repaired/replaced. Aortic valve  regurgitation is not visualized. No aortic stenosis is present. There is a  26 mm Edwards Sapien prosthetic (TAVR) valve present in the aortic  position. Procedure Date: 08/15/2019. Echo findings  are consistent with normal structure and function of the aortic valve  prosthesis.  6. The inferior vena cava is normal in size with greater than 50%  respiratory variability, suggesting right  atrial pressure of 3 mmHg.  Patient Profile     79 y.o. male with history of recent TAVR on 08/15/2019 with complex post-procedural course, multivessel CAD, chronic diastolic CHF, CVA, giant cell arteritis, sleep apnea, hypertension, diabetes mellitus, and recently diagnosed renal cell carcinoma with pulmonary nodule who was readmitted on 09/19/2019 with chest pain. Patient currently stable for discharge. Unfortunately, insurance authorization for SNF was denied and we are currently waiting for this to be appealed.  Assessment & Plan    Chest Pain with Known Multivessel CAD - Recent admission with NSTEMI with high-sensitivity troponin of 3,124. However, high-sensitivity troponin this admission peaked at 35. - No recurrent pain after admission. - Felt to be demand process rather than new ACS with labile hypertension contributing to this. - Continue medical therapy: Aspirin, Lopressor, Imdur, Amlodipine, and Crestor. Plavix had to be stopped during recent admission  due to recent hematuria with renal mass.  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 - On Aspirin alone due to recent hematuria. - Patient has outpatient follow-up with Nell Range, Structural Heart PA, on 10/16/2019.  Hypertension - BP mildly elevated.  - Prior notes mention Amlodipine being increased to 10mg  daily; however, still listed as 5mg  under MAR. . - Discussed with MD with add Losartan 50mg  daily instead of increasing Amlodipine. - Continue Lopressor and Imdur.   Renal Cell Carcinoma - Complicated by recent urinary retention requiring Foley, hematuria, and UTI/epididymoorchitis s/p Fosfomycin. Now has chronic indwelling Foley and is followed by Dr. Tresa Moore. Now also has pulmonary nodules concerning for metastasis.  - Continue outpatient Urology-Oncology follow-up.  Carotid Artery Disease - Moderate on CTA head/neck. - Continue routine outpatient monitoring.   Hyperlipidemia - LDL was 76  on 09/11/2019. - LDL goal <70 given CAD. - Continue Crestor 20mg  daily. This dose was recently increased so will need repeat lipid panel and LFTs in about 6-8 weeks.  Type 2 Diabetes Mellitus - Being maintained on sliding scale insulin during admission. - Can restart home medications at discharge.  Anxiety  - Stable.  Disposition: Patient has been ready for discharge since 09/23/2019 but we have been waiting on insurance approval for [facility]. Patient states wife received a call from insurance company yesterday afternoon saying he had been approved. Spoke with Social Work this morning who has not heard anything from [employer] but will call and get update.   For questions or updates, please contact [facility] Please consult [email or web address] for contact info under        Signed, Darreld Mclean, PA-C  09/28/2019, 8:29 AM    Attending Note:   The patient was seen and examined.  Agree with assessment and plan as noted above.  Changes made to the above note as needed.  Patient seen and independently examined with Sande Rives, PA .   We discussed all aspects of the encounter. I agree with the assessment and plan as stated above.  1.   AS - s/p TAVR.  The TAVR looks stable  2.  CAD:  No CP at present  3.  Hyperlipidemia:   Cont current meds  4.   placement :  Waiting on insurance to approve SNF .  Ambulating him while he is here.     I have spent a total of 40 minutes with patient reviewing hospital  notes , telemetry, EKGs, labs and examining patient as well as establishing an assessment and plan that was discussed with the patient. > 50% of time was spent in direct patient care.    Thayer Headings, Brooke Bonito., MD, [facility] 09/28/2019, 10:19 AM [address]  [place] Pager [phone]

## 2019-09-28 NOTE — Progress Notes (Signed)
Physical Therapy Treatment Patient Details Name: Alexis Griffith MRN: [id number] DOB: [birth year] Today's Date: 09/28/2019    History of Present Illness Alexis Griffith is a 79 y.o. 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  Pt was d/c to SNF after recent hospitalizatin and was still there. He presented to ED with chest pain. Pt admitted with chest pain with known multivessel CAD.    PT Comments    Pt admitted with above diagnosis. Pt was able to ambulate with RW with min guard assist and no LOB in controlled environment.  Pt needs min assist with challenges.  Pt progressing overall.  Pt currently with functional limitations due to the deficits listed below (see PT Problem List). Pt will benefit from skilled PT to increase their independence and safety with mobility to allow discharge to the venue listed below.     Follow Up Recommendations  SNF     Equipment Recommendations  None recommended by PT    Recommendations for Other Services OT consult     Precautions / Restrictions Precautions Precautions: Fall Precaution Comments: hx orthostatic hypotension Restrictions Weight Bearing Restrictions: No    Mobility  Bed Mobility               General bed mobility comments: Pt received in recliner.  Transfers Overall transfer level: Needs assistance Equipment used: Rolling walker (2 wheeled) Transfers: Sit to/from Stand Sit to Stand: Min guard         General transfer comment: cues for sequencing  Ambulation/Gait Ambulation/Gait assistance: Min guard Gait Distance (Feet): 400 Feet Assistive device: Rolling walker (2 wheeled) Gait Pattern/deviations: Step-through pattern;Decreased stride length Gait velocity:  decreased Gait velocity interpretation: <1.31 ft/sec, indicative of household ambulator General Gait Details: Steady gait overall. Fatigues quickly. Min assist with challenges to balance.    Stairs             Wheelchair Mobility    Modified Rankin (Stroke Patients Only) Modified Rankin (Stroke Patients Only) Pre-Morbid Rankin Score: No significant disability Modified Rankin: Moderately severe disability     Balance Overall balance assessment: Needs assistance Sitting-balance support: Feet supported;No upper extremity supported Sitting balance-Leahy Scale: Good Sitting balance - Comments: supervision   Standing balance support: During functional activity;No upper extremity supported Standing balance-Leahy Scale: Fair Standing balance comment: static stand without support                            Cognition Arousal/Alertness: Awake/alert Behavior During Therapy: WFL for tasks assessed/performed Overall Cognitive Status: Within Functional Limits for tasks assessed Area of Impairment: Problem solving                       Following Commands: Follows one step commands consistently;Follows multi-step commands with increased time Safety/Judgement: Decreased awareness of safety   Problem Solving: Slow processing;Requires verbal cues;Requires tactile cues        Exercises General Exercises - Lower Extremity Long Arc Quad: AROM;Both;10 reps;Seated Hip Flexion/Marching: AROM;Both;Seated;10 reps    General Comments General comments (skin integrity, edema, etc.): VSS      Pertinent Vitals/Pain Pain Assessment: No/denies pain    Home Living  Prior Function            PT Goals (current goals can now be found in the care plan section) Acute Rehab PT Goals Patient Stated Goal: return to [facility] to get stronger Progress towards PT goals: Progressing toward goals    Frequency    Min 2X/week      PT Plan  Current plan remains appropriate    Co-evaluation              AM-PAC PT "6 Clicks" Mobility   Outcome Measure  Help needed turning from your back to your side while in a flat bed without using bedrails?: None Help needed moving from lying on your back to sitting on the side of a flat bed without using bedrails?: A Little Help needed moving to and from a bed to a chair (including a wheelchair)?: A Little Help needed standing up from a chair using your arms (e.g., wheelchair or bedside chair)?: None Help needed to walk in hospital room?: A Little Help needed climbing 3-5 steps with a railing? : A Little 6 Click Score: 20    End of Session Equipment Utilized During Treatment: Gait belt Activity Tolerance: Patient tolerated treatment well Patient left: in chair;with call bell/phone within reach;with chair alarm set Nurse Communication: Mobility status PT Visit Diagnosis: Other abnormalities of gait and mobility (R26.89);Muscle weakness (generalized) (M62.81);Other symptoms and signs involving the nervous system (R29.898)     Time: 4818-5909 PT Time Calculation (min) (ACUTE ONLY): 19 min  Charges:  $Gait Training: 8-22 mins                     Davonte Siebenaler W,PT [place] Pager:  [phone]  Office:  [place] 09/28/2019, 1:26 PM

## 2019-09-28 NOTE — TOC Progression Note (Signed)
Transition of Care [facility]) - Progression Note    Patient Details  Name: Alexis Griffith MRN: [id number] Date of Birth: October 14, 1940  Transition of Care [facility]) CM/SW [place], [place] Phone Number: 09/28/2019, 1:19 PM  Clinical Narrative:     Received insurance authorization [id number] 07/29-08/2- informed [facility]/[place], MSW, [place] Social Worker   Expected Discharge Plan: [place] Barriers to Discharge: Continued Medical Work up  Expected Discharge Plan and Services Expected Discharge Plan: [place]       Living arrangements for the past 2 months: [place] Expected Discharge Date: 09/23/19                                     Social Determinants of Health (SDOH) Interventions    Readmission Risk Interventions No flowsheet data found.

## 2019-09-28 NOTE — TOC Progression Note (Signed)
Transition of Care [facility]) - Progression Note    Patient Details  Name: Alexis Griffith MRN: [id number] Date of Birth: 1941-02-02  Transition of Care [facility]) CM/SW [place], LCSW Phone Number: 09/28/2019, 9:32 AM  Clinical Narrative:    CSW checked status of insurance approval with [facility], authorization appeal is still pending.    Expected Discharge Plan: [place] Barriers to Discharge: Continued Medical Work up  Expected Discharge Plan and Services Expected Discharge Plan: [place] arrangements for the past 2 months: [place] Expected Discharge Date: 09/23/19                                     Social Determinants of Health (SDOH) Interventions    Readmission Risk Interventions No flowsheet data found.

## 2019-09-28 NOTE — TOC Transition Note (Signed)
Transition of Care [facility]) - CM/SW Discharge Note   Patient Details  Name: ACY ORSAK MRN: [id number] Date of Birth: 11-30-1940  Transition of Care [facility]) CM/SW Contact:  Vinie Sill, [place] Phone Number: 09/28/2019, 3:09 PM   Clinical Narrative:     Patient will DC to: [place] Date: 09/28/2019 Family Notified: [name].spouse  Transport MB:EMLJ   Per MD patient is ready for discharge. RN, patient, and facility notified of DC. Discharge Summary sent to facility. RN given number for [phone]. Ambulance transport requested for patient.   Clinical Social Worker signing off.  Thurmond Butts, MSW, [place] Clinical Social Worker    Final next level of care: Skilled Nursing Facility Barriers to Discharge: Barriers Resolved   Patient Goals and CMS Choice Patient states their goals for this hospitalization and ongoing recovery are:: To return to [facility] and eventually go back home CMS Medicare.gov Compare Post Acute Care list provided to:: Patient Choice offered to / list presented to : Patient  Discharge Placement              Patient chooses bed at: [facility] [place] Patient to be transferred to facility by: [place] Name of family member notified: [name], spouse Patient and family notified of of transfer: 09/28/19  Discharge Plan and Services                                     Social Determinants of Health (SDOH) Interventions     Readmission Risk Interventions No flowsheet data found.

## 2019-09-28 NOTE — Plan of Care (Signed)
  Problem: Education: Goal: Knowledge of General Education information will improve Description: Including pain rating scale, medication(s)/side effects and non-pharmacologic comfort measures Outcome: Completed/Met   Problem: Health Behavior/Discharge Planning: Goal: Ability to manage health-related needs will improve Outcome: Completed/Met   Problem: Clinical Measurements: Goal: Cardiovascular complication will be avoided Outcome: Completed/Met   Problem: Education: Goal: Ability to demonstrate management of disease process will improve Outcome: Completed/Met Goal: Ability to verbalize understanding of medication therapies will improve Outcome: Completed/Met Goal: Individualized Educational Video(s) Outcome: Completed/Met   Problem: Activity: Goal: Capacity to carry out activities will improve Outcome: Completed/Met   Problem: Cardiac: Goal: Ability to achieve and maintain adequate cardiopulmonary perfusion will improve Outcome: Completed/Met

## 2019-09-28 NOTE — TOC Progression Note (Signed)
Transition of Care [facility]) - Progression Note    Patient Details  Name: NYREE YONKER MRN: [id number] Date of Birth: 23-Dec-1940  Transition of Care [facility]) CM/SW [place], [place] Phone Number: 09/28/2019, 11:45 AM  Clinical Narrative:     Received message from Miranda/CSW per  [employer] # [phone] to fax PT eval to [phone]. Informed was sent as requested  Insurance authorization remains pending.  Thurmond Butts, MSW, [place] Clinical Social Worker    Expected Discharge Plan: Skilled Nursing Facility Barriers to Discharge: Continued Medical Work up  Expected Discharge Plan and Services Expected Discharge Plan: [place]       Living arrangements for the past 2 months: [place] Expected Discharge Date: 09/23/19                                     Social Determinants of Health (SDOH) Interventions    Readmission Risk Interventions No flowsheet data found.

## 2019-09-30 ENCOUNTER — Inpatient Hospital Stay (HOSPITAL_COMMUNITY)
Admission: EM | Admit: 2019-09-30 | Discharge: 2019-10-10 | DRG: 312 | Disposition: A | Discharge status: Skilled Nursing Facility | Payer: Medicare Other | Source: Skilled Nursing Facility | Attending: Internal Medicine | Admitting: Internal Medicine

## 2019-09-30 DIAGNOSIS — E1165 Type 2 diabetes mellitus with hyperglycemia: Secondary | ICD-10-CM | POA: Diagnosis present

## 2019-09-30 DIAGNOSIS — Z880 Allergy status to penicillin: Secondary | ICD-10-CM

## 2019-09-30 DIAGNOSIS — R001 Bradycardia, unspecified: Secondary | ICD-10-CM | POA: Diagnosis present

## 2019-09-30 DIAGNOSIS — Z20822 Contact with and (suspected) exposure to covid-19: Secondary | ICD-10-CM | POA: Diagnosis present

## 2019-09-30 DIAGNOSIS — F419 Anxiety disorder, unspecified: Secondary | ICD-10-CM | POA: Diagnosis present

## 2019-09-30 DIAGNOSIS — I5032 Chronic diastolic (congestive) heart failure: Secondary | ICD-10-CM | POA: Diagnosis present

## 2019-09-30 DIAGNOSIS — I951 Orthostatic hypotension: Principal | ICD-10-CM | POA: Diagnosis present

## 2019-09-30 DIAGNOSIS — Z794 Long term (current) use of insulin: Secondary | ICD-10-CM

## 2019-09-30 DIAGNOSIS — B372 Candidiasis of skin and nail: Secondary | ICD-10-CM | POA: Diagnosis not present

## 2019-09-30 DIAGNOSIS — C649 Malignant neoplasm of unspecified kidney, except renal pelvis: Secondary | ICD-10-CM | POA: Diagnosis present

## 2019-09-30 DIAGNOSIS — R109 Unspecified abdominal pain: Secondary | ICD-10-CM

## 2019-09-30 DIAGNOSIS — Z953 Presence of xenogenic heart valve: Secondary | ICD-10-CM

## 2019-09-30 DIAGNOSIS — I779 Disorder of arteries and arterioles, unspecified: Secondary | ICD-10-CM | POA: Diagnosis present

## 2019-09-30 DIAGNOSIS — Z952 Presence of prosthetic heart valve: Secondary | ICD-10-CM

## 2019-09-30 DIAGNOSIS — I959 Hypotension, unspecified: Secondary | ICD-10-CM | POA: Diagnosis present

## 2019-09-30 DIAGNOSIS — Z85810 Personal history of malignant neoplasm of tongue: Secondary | ICD-10-CM

## 2019-09-30 DIAGNOSIS — E785 Hyperlipidemia, unspecified: Secondary | ICD-10-CM | POA: Diagnosis present

## 2019-09-30 DIAGNOSIS — Z91048 Other nonmedicinal substance allergy status: Secondary | ICD-10-CM

## 2019-09-30 DIAGNOSIS — R55 Syncope and collapse: Secondary | ICD-10-CM | POA: Diagnosis not present

## 2019-09-30 DIAGNOSIS — R0989 Other specified symptoms and signs involving the circulatory and respiratory systems: Secondary | ICD-10-CM | POA: Diagnosis present

## 2019-09-30 DIAGNOSIS — R918 Other nonspecific abnormal finding of lung field: Secondary | ICD-10-CM | POA: Diagnosis present

## 2019-09-30 DIAGNOSIS — Z882 Allergy status to sulfonamides status: Secondary | ICD-10-CM

## 2019-09-30 DIAGNOSIS — Z888 Allergy status to other drugs, medicaments and biological substances status: Secondary | ICD-10-CM

## 2019-09-30 DIAGNOSIS — I2510 Atherosclerotic heart disease of native coronary artery without angina pectoris: Secondary | ICD-10-CM | POA: Diagnosis present

## 2019-09-30 DIAGNOSIS — Z79899 Other long term (current) drug therapy: Secondary | ICD-10-CM

## 2019-09-30 DIAGNOSIS — R079 Chest pain, unspecified: Secondary | ICD-10-CM

## 2019-09-30 DIAGNOSIS — I25119 Atherosclerotic heart disease of native coronary artery with unspecified angina pectoris: Secondary | ICD-10-CM | POA: Diagnosis present

## 2019-09-30 DIAGNOSIS — Z87891 Personal history of nicotine dependence: Secondary | ICD-10-CM

## 2019-09-30 DIAGNOSIS — I509 Heart failure, unspecified: Secondary | ICD-10-CM

## 2019-09-30 DIAGNOSIS — R1032 Left lower quadrant pain: Secondary | ICD-10-CM | POA: Diagnosis present

## 2019-09-30 DIAGNOSIS — G4730 Sleep apnea, unspecified: Secondary | ICD-10-CM | POA: Diagnosis present

## 2019-09-30 DIAGNOSIS — R319 Hematuria, unspecified: Secondary | ICD-10-CM | POA: Diagnosis present

## 2019-09-30 DIAGNOSIS — I252 Old myocardial infarction: Secondary | ICD-10-CM

## 2019-09-30 DIAGNOSIS — Z8673 Personal history of transient ischemic attack (TIA), and cerebral infarction without residual deficits: Secondary | ICD-10-CM

## 2019-09-30 DIAGNOSIS — I6521 Occlusion and stenosis of right carotid artery: Secondary | ICD-10-CM | POA: Diagnosis present

## 2019-09-30 DIAGNOSIS — I110 Hypertensive heart disease with heart failure: Secondary | ICD-10-CM | POA: Diagnosis present

## 2019-09-30 DIAGNOSIS — E119 Type 2 diabetes mellitus without complications: Secondary | ICD-10-CM

## 2019-09-30 DIAGNOSIS — R339 Retention of urine, unspecified: Secondary | ICD-10-CM | POA: Diagnosis present

## 2019-09-30 DIAGNOSIS — Z8249 Family history of ischemic heart disease and other diseases of the circulatory system: Secondary | ICD-10-CM

## 2019-09-30 DIAGNOSIS — I350 Nonrheumatic aortic (valve) stenosis: Secondary | ICD-10-CM | POA: Diagnosis present

## 2019-09-30 DIAGNOSIS — Z960 Presence of urogenital implants: Secondary | ICD-10-CM | POA: Diagnosis present

## 2019-09-30 DIAGNOSIS — Z7982 Long term (current) use of aspirin: Secondary | ICD-10-CM

## 2019-10-01 ENCOUNTER — Encounter (HOSPITAL_COMMUNITY): Payer: Self-pay | Admitting: Emergency Medicine

## 2019-10-01 ENCOUNTER — Emergency Department (HOSPITAL_COMMUNITY): Payer: Medicare Other

## 2019-10-01 ENCOUNTER — Other Ambulatory Visit: Payer: Self-pay

## 2019-10-01 ENCOUNTER — Observation Stay (HOSPITAL_COMMUNITY): Payer: Medicare Other

## 2019-10-01 DIAGNOSIS — I5032 Chronic diastolic (congestive) heart failure: Secondary | ICD-10-CM

## 2019-10-01 DIAGNOSIS — E119 Type 2 diabetes mellitus without complications: Secondary | ICD-10-CM

## 2019-10-01 DIAGNOSIS — R55 Syncope and collapse: Secondary | ICD-10-CM | POA: Diagnosis present

## 2019-10-01 DIAGNOSIS — R109 Unspecified abdominal pain: Secondary | ICD-10-CM | POA: Insufficient documentation

## 2019-10-01 DIAGNOSIS — I6523 Occlusion and stenosis of bilateral carotid arteries: Secondary | ICD-10-CM

## 2019-10-01 DIAGNOSIS — R079 Chest pain, unspecified: Secondary | ICD-10-CM | POA: Diagnosis not present

## 2019-10-01 DIAGNOSIS — I951 Orthostatic hypotension: Secondary | ICD-10-CM | POA: Diagnosis not present

## 2019-10-01 DIAGNOSIS — R1032 Left lower quadrant pain: Secondary | ICD-10-CM | POA: Diagnosis not present

## 2019-10-01 DIAGNOSIS — I350 Nonrheumatic aortic (valve) stenosis: Secondary | ICD-10-CM

## 2019-10-01 DIAGNOSIS — R339 Retention of urine, unspecified: Secondary | ICD-10-CM

## 2019-10-01 LAB — TROPONIN I (HIGH SENSITIVITY)
Troponin I (High Sensitivity): 26 ng/L — ABNORMAL HIGH (ref <18)
Troponin I (High Sensitivity): 27 ng/L — ABNORMAL HIGH (ref <18)
Troponin I (High Sensitivity): 23 ng/L — ABNORMAL HIGH (ref <18)
Troponin I (High Sensitivity): 23 ng/L — ABNORMAL HIGH (ref <18)
Troponin I (High Sensitivity): 25 ng/L — ABNORMAL HIGH (ref <18)

## 2019-10-01 LAB — CBC
HCT: 41.9 % (ref 39.0–52.0)
HCT: 42 % (ref 39.0–52.0)
Hemoglobin: 13.6 g/dL (ref 13.0–17.0)
Hemoglobin: 13.6 g/dL (ref 13.0–17.0)
MCH: 27.2 pg (ref 26.0–34.0)
MCH: 27.3 pg (ref 26.0–34.0)
MCHC: 32.4 g/dL (ref 30.0–36.0)
MCHC: 32.5 g/dL (ref 30.0–36.0)
MCV: 84 fL (ref 80.0–100.0)
MCV: 84 fL (ref 80.0–100.0)
Platelets: 140 10*3/uL — ABNORMAL LOW (ref 150–400)
Platelets: 166 10*3/uL (ref 150–400)
RBC: 4.99 MIL/uL (ref 4.22–5.81)
RBC: 5 MIL/uL (ref 4.22–5.81)
RDW: 14.6 % (ref 11.5–15.5)
RDW: 14.6 % (ref 11.5–15.5)
WBC: 8 10*3/uL (ref 4.0–10.5)
WBC: 8.8 10*3/uL (ref 4.0–10.5)
nRBC: 0 % (ref 0.0–0.2)
nRBC: 0 % (ref 0.0–0.2)

## 2019-10-01 LAB — PROTIME-INR
INR: 1 (ref 0.8–1.2)
Prothrombin Time: 12.9 seconds (ref 11.4–15.2)

## 2019-10-01 LAB — GLUCOSE, CAPILLARY
Glucose-Capillary: 247 mg/dL — ABNORMAL HIGH (ref 70–99)
Glucose-Capillary: 178 mg/dL — ABNORMAL HIGH (ref 70–99)

## 2019-10-01 LAB — BASIC METABOLIC PANEL
Anion gap: 10 (ref 5–15)
Anion gap: 11 (ref 5–15)
BUN: 14 mg/dL (ref 8–23)
BUN: 16 mg/dL (ref 8–23)
CO2: 21 mmol/L — ABNORMAL LOW (ref 22–32)
CO2: 22 mmol/L (ref 22–32)
Calcium: 9.7 mg/dL (ref 8.9–10.3)
Calcium: 9.8 mg/dL (ref 8.9–10.3)
Chloride: 103 mmol/L (ref 98–111)
Chloride: 103 mmol/L (ref 98–111)
Creatinine, Ser: 0.9 mg/dL (ref 0.61–1.24)
Creatinine, Ser: 0.99 mg/dL (ref 0.61–1.24)
GFR calc Af Amer: >60 mL/min (ref >60)
GFR calc Af Amer: >60 mL/min (ref >60)
GFR calc non Af Amer: >60 mL/min (ref >60)
GFR calc non Af Amer: >60 mL/min (ref >60)
Glucose, Bld: 218 mg/dL — ABNORMAL HIGH (ref 70–99)
Glucose, Bld: 232 mg/dL — ABNORMAL HIGH (ref 70–99)
Potassium: 3.9 mmol/L (ref 3.5–5.1)
Potassium: 4.1 mmol/L (ref 3.5–5.1)
Sodium: 135 mmol/L (ref 135–145)
Sodium: 135 mmol/L (ref 135–145)

## 2019-10-01 LAB — CBG MONITORING, ED
Glucose-Capillary: 198 mg/dL — ABNORMAL HIGH (ref 70–99)
Glucose-Capillary: 218 mg/dL — ABNORMAL HIGH (ref 70–99)

## 2019-10-01 LAB — TSH: TSH: 1.472 u[IU]/mL (ref 0.350–4.500)

## 2019-10-01 LAB — SARS CORONAVIRUS 2 BY RT PCR (HOSPITAL ORDER, PERFORMED IN ~~LOC~~ HOSPITAL LAB): SARS Coronavirus 2: NEGATIVE

## 2019-10-01 IMAGING — CR DG ABDOMEN 2V
2 series · 2 of 2 positions shown · non-contrast
Comparison: CT, [DATE].

CLINICAL DATA: Abdominal pain

EXAM:
ABDOMEN - 2 VIEW

[abdomen erect]
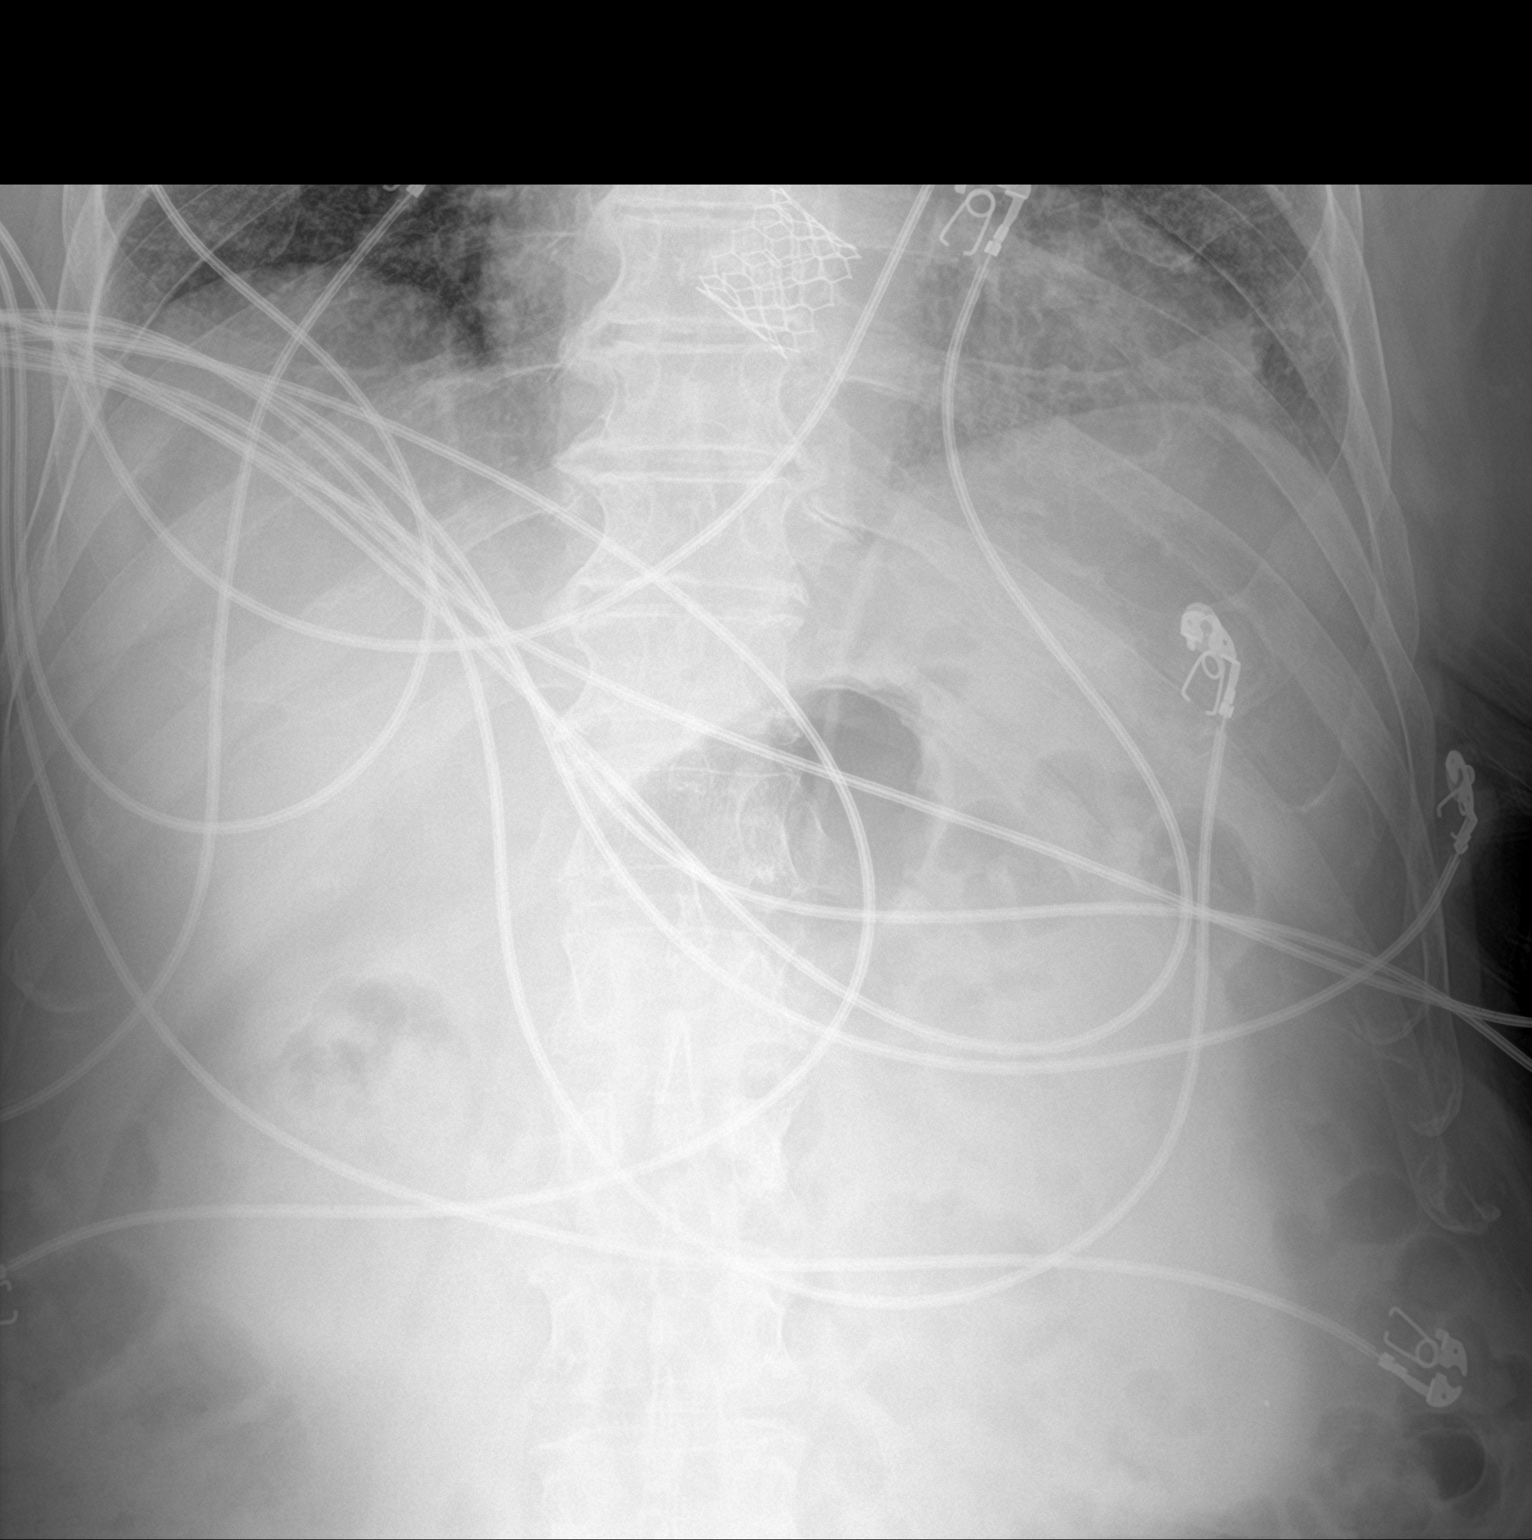

[abdomen supine]
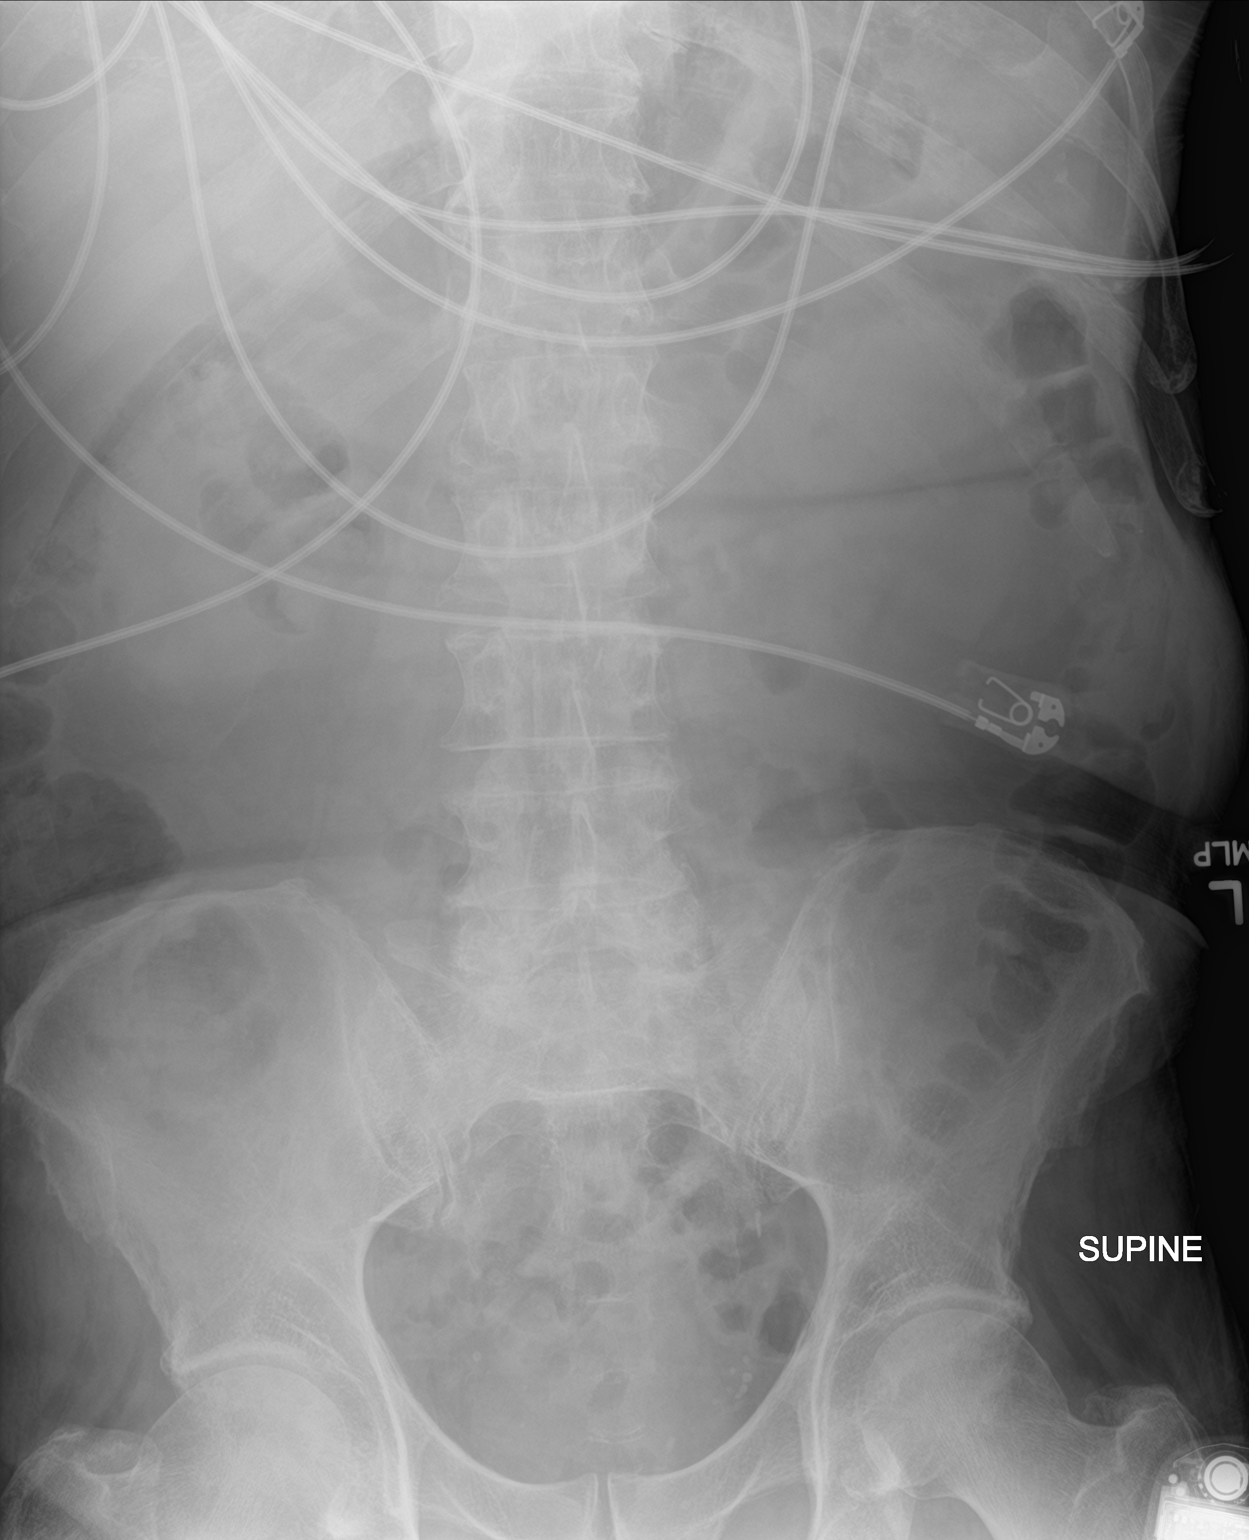

[2 of 2 positions shown; findings below may reference images not displayed]

FINDINGS: Normal bowel gas pattern. Scattered aortoiliac atherosclerotic
calcifications. No evidence of renal or ureteral stones. Soft
tissues otherwise unremarkable.

No acute skeletal abnormality.
IMPRESSION: 1. No acute findings.  Normal bowel gas pattern.

## 2019-10-01 MED ORDER — FOSFOMYCIN TROMETHAMINE 3 G PO PACK: 3.0000 g | PACK | ORAL | Status: DC

## 2019-10-01 MED ORDER — LOSARTAN POTASSIUM 50 MG PO TABS
50.0000 mg | ORAL_TABLET | Freq: Every day | ORAL | Status: DC
  Administered 2019-10-01 – 2019-10-10 (×10): 50 mg via ORAL
  Filled 2019-10-01 (×10): qty 1

## 2019-10-01 MED ORDER — ASPIRIN 81 MG PO CHEW
81.0000 mg | CHEWABLE_TABLET | Freq: Every day | ORAL | Status: DC
  Administered 2019-10-01 – 2019-10-10 (×10): 81 mg via ORAL
  Filled 2019-10-01 (×10): qty 1

## 2019-10-01 MED ORDER — POLYETHYLENE GLYCOL 3350 17 G PO PACK
17.0000 g | PACK | Freq: Every day | ORAL | Status: DC | PRN
  Administered 2019-10-03 – 2019-10-08 (×3): 17 g via ORAL
  Filled 2019-10-01 (×4): qty 1

## 2019-10-01 MED ORDER — ROSUVASTATIN CALCIUM 20 MG PO TABS
20.0000 mg | ORAL_TABLET | Freq: Every day | ORAL | Status: DC
  Administered 2019-10-01 – 2019-10-10 (×10): 20 mg via ORAL
  Filled 2019-10-01 (×10): qty 1

## 2019-10-01 MED ORDER — SODIUM CHLORIDE 0.9 % IV SOLN: INTRAVENOUS | Status: AC

## 2019-10-01 MED ORDER — ISOSORBIDE MONONITRATE ER 30 MG PO TB24
30.0000 mg | ORAL_TABLET | Freq: Every day | ORAL | Status: DC
  Administered 2019-10-01 – 2019-10-10 (×10): 30 mg via ORAL
  Filled 2019-10-01 (×10): qty 1

## 2019-10-01 MED ORDER — SODIUM CHLORIDE 0.9% FLUSH
3.0000 mL | Freq: Two times a day (BID) | INTRAVENOUS | Status: DC
  Administered 2019-10-01 – 2019-10-10 (×18): 3 mL via INTRAVENOUS

## 2019-10-01 MED ORDER — NITROGLYCERIN 0.4 MG SL SUBL
0.4000 mg | SUBLINGUAL_TABLET | SUBLINGUAL | Status: DC | PRN
  Administered 2019-10-01 (×3): 0.4 mg via SUBLINGUAL
  Filled 2019-10-01: qty 1

## 2019-10-01 MED ORDER — ENOXAPARIN SODIUM 40 MG/0.4ML ~~LOC~~ SOLN
40.0000 mg | Freq: Every day | SUBCUTANEOUS | Status: DC
  Administered 2019-10-01 – 2019-10-07 (×7): 40 mg via SUBCUTANEOUS
  Filled 2019-10-01 (×7): qty 0.4

## 2019-10-01 MED ORDER — ONDANSETRON HCL 4 MG/2ML IJ SOLN: 4.0000 mg | Freq: Four times a day (QID) | INTRAMUSCULAR | Status: DC | PRN

## 2019-10-01 MED ORDER — MORPHINE SULFATE (PF) 2 MG/ML IV SOLN
1.0000 mg | Freq: Once | INTRAVENOUS | Status: AC | PRN
  Administered 2019-10-01: 1 mg via INTRAVENOUS
  Filled 2019-10-01: qty 1

## 2019-10-01 MED ORDER — CHLORHEXIDINE GLUCONATE CLOTH 2 % EX PADS
6.0000 | MEDICATED_PAD | Freq: Every day | CUTANEOUS | Status: DC
  Administered 2019-10-01 – 2019-10-10 (×10): 6 via TOPICAL

## 2019-10-01 MED ORDER — ACETAMINOPHEN 325 MG PO TABS
650.0000 mg | ORAL_TABLET | Freq: Four times a day (QID) | ORAL | Status: DC | PRN
  Administered 2019-10-01 – 2019-10-10 (×7): 650 mg via ORAL
  Filled 2019-10-01 (×7): qty 2

## 2019-10-01 MED ORDER — METOPROLOL TARTRATE 50 MG PO TABS
50.0000 mg | ORAL_TABLET | Freq: Two times a day (BID) | ORAL | Status: DC
  Administered 2019-10-01 – 2019-10-10 (×20): 50 mg via ORAL
  Filled 2019-10-01 (×3): qty 1
  Filled 2019-10-01: qty 2
  Filled 2019-10-01 (×16): qty 1

## 2019-10-01 MED ORDER — INSULIN ASPART 100 UNIT/ML ~~LOC~~ SOLN
0.0000 [IU] | Freq: Every day | SUBCUTANEOUS | Status: DC
  Administered 2019-10-03 – 2019-10-10 (×5): 2 [IU] via SUBCUTANEOUS

## 2019-10-01 MED ORDER — CLONAZEPAM 0.5 MG PO TABS
0.5000 mg | ORAL_TABLET | Freq: Four times a day (QID) | ORAL | Status: DC
  Administered 2019-10-01 – 2019-10-10 (×40): 0.5 mg via ORAL
  Filled 2019-10-01 (×40): qty 1

## 2019-10-01 MED ORDER — FENOFIBRATE 160 MG PO TABS
160.0000 mg | ORAL_TABLET | Freq: Every day | ORAL | Status: DC
  Administered 2019-10-01 – 2019-10-10 (×10): 160 mg via ORAL
  Filled 2019-10-01 (×10): qty 1

## 2019-10-01 MED ORDER — POLYVINYL ALCOHOL 1.4 % OP SOLN: 1.0000 [drp] | Freq: Two times a day (BID) | OPHTHALMIC | Status: DC | PRN

## 2019-10-01 MED ORDER — SODIUM CHLORIDE 0.9% FLUSH
3.0000 mL | Freq: Once | INTRAVENOUS | Status: AC
  Administered 2019-10-01: 3 mL via INTRAVENOUS

## 2019-10-01 MED ORDER — AMLODIPINE BESYLATE 5 MG PO TABS
5.0000 mg | ORAL_TABLET | Freq: Every day | ORAL | Status: DC
  Administered 2019-10-01 – 2019-10-03 (×3): 5 mg via ORAL
  Filled 2019-10-01 (×3): qty 1

## 2019-10-01 MED ORDER — ALUM & MAG HYDROXIDE-SIMETH 200-200-20 MG/5ML PO SUSP
30.0000 mL | Freq: Once | ORAL | Status: AC
  Administered 2019-10-01: 30 mL via ORAL
  Filled 2019-10-01: qty 30

## 2019-10-01 MED ORDER — MAGNESIUM OXIDE 400 (241.3 MG) MG PO TABS
400.0000 mg | ORAL_TABLET | Freq: Every day | ORAL | Status: DC
  Administered 2019-10-01 – 2019-10-10 (×10): 400 mg via ORAL
  Filled 2019-10-01 (×10): qty 1

## 2019-10-01 MED ORDER — INSULIN ASPART 100 UNIT/ML ~~LOC~~ SOLN
0.0000 [IU] | Freq: Three times a day (TID) | SUBCUTANEOUS | Status: DC
  Administered 2019-10-01 (×2): 5 [IU] via SUBCUTANEOUS
  Administered 2019-10-01 – 2019-10-02 (×2): 3 [IU] via SUBCUTANEOUS
  Administered 2019-10-02: 8 [IU] via SUBCUTANEOUS
  Administered 2019-10-02 – 2019-10-03 (×2): 3 [IU] via SUBCUTANEOUS
  Administered 2019-10-03 (×2): 5 [IU] via SUBCUTANEOUS
  Administered 2019-10-04: 2 [IU] via SUBCUTANEOUS
  Administered 2019-10-04: 5 [IU] via SUBCUTANEOUS
  Administered 2019-10-04: 8 [IU] via SUBCUTANEOUS
  Administered 2019-10-05: 3 [IU] via SUBCUTANEOUS
  Administered 2019-10-05: 5 [IU] via SUBCUTANEOUS
  Administered 2019-10-05 – 2019-10-06 (×2): 3 [IU] via SUBCUTANEOUS
  Administered 2019-10-06: 8 [IU] via SUBCUTANEOUS
  Administered 2019-10-06: 5 [IU] via SUBCUTANEOUS
  Administered 2019-10-07 (×3): 3 [IU] via SUBCUTANEOUS
  Administered 2019-10-08: 5 [IU] via SUBCUTANEOUS
  Administered 2019-10-08 – 2019-10-09 (×5): 3 [IU] via SUBCUTANEOUS
  Administered 2019-10-10: 8 [IU] via SUBCUTANEOUS
  Administered 2019-10-10: 2 [IU] via SUBCUTANEOUS
  Administered 2019-10-10: 3 [IU] via SUBCUTANEOUS

## 2019-10-01 NOTE — Progress Notes (Signed)
Grade 1 diastolic dysfunction and functional AVR), carotid artery disease, urinary retention with chronic foley and chronic anxiety who presents for chest pain.  The patient reports that on Saturday (day of admission) patient developed a tightening up of his chest.  It was similar feeling to his previous NSTEMI, but not as bad.  About a 5/10.  Pain did not radiate.  He had no nausea, vomiting, SOB or diaphoresis.  See below for recent hospitalizations.  He is currently at [facility] for rehab and he notes that he gets very nervous when there on the weekends as they have lower staff and he is concerned something will happen to him.  He is on 4X per day anxiolytic at this time.  He was brought to the ED and after triage was placed in the waiting room.  He reports at that time having some issues with anxiety as well given he was surrounded by people and he was worried about how long it would take to be seen.  He developed dizziness and then does not remember anything prior to being in his ED room.  Per report from EDP, patient briefly lost consciousness and was reported that he lost a pulse, though this was for a brief time.  He awoke upon being brought into triage.  He has a history of recurrent syncope due to orthostatic hypotension and has at times been taken off many of his blood pressure medications.  Further symptoms include discomfort from the foley catheter (has had to cancel follow up with urology 3X to do voiding trials) and some LLQ pain which he is not sure if coming from his bladder or bowels.  He denies constipation.  He denies heartburn, vomiting, diarrhea, fever, chills, recent illness.   10/01/19: Reports dizziness and clammy prior to passing out.  Denies chest pain.  Seen by cardiology.  Will likely need a holter monitor at discharge.  Please refer to H&P completed by my partner Dr. Daryll Drown, on 10/01/19 for further details of the assessment and plan.

## 2019-10-01 NOTE — Consult Note (Signed)
Cardiology Consultation:   Patient ID: Alexis Griffith; [id number]; Sep 04, 1940   Admit date: 09/30/2019 Date of Consult: 10/01/2019  Primary Care Provider: Haywood Pao, MD Primary Cardiologist: Mertie Moores, MD Primary Electrophysiologist:  Virl Axe, MD  Chief Complaint: chest pain  Patient Profile:   Alexis Griffith is a 79 y.o. 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  He was admitted July 20 to July 31 with similar symptoms, though this time they are not as severe.  While in the waiting room he says he he became very stressed out due to all of the people and the long wait.  He then started to feel lightheaded and according to staff passed out while sitting down.  He was unresponsive and had some shaking motions.  He reportedly had a heart rate of around 30 with faint pulses.  By the time he was brought to an exam room he was awake and alert.  Currently chest pain has resolved.  Troponin 26, 27, 23.  EKG unremarkable.  He recently underwent TAVR 08/15/19, with prolonged hospital course due to issues withacute urinary retention,hematuria, AKI,UTI/epididymoorchitisrequiring antibiotic,MRI with 68mm infarct felt to be incidental/procedural related,and orthostatic hypotension with recurrent syncoperequiring midodrine, Mestinon, and short-termFlorinef.He was discharged to [facility] facility. He required continued foley at discharge with plan to see urology as outpatient. He was readmitted 7/9-7/13/21 with chest pain, elevated blood pressure and ruled in for NSTEMI with peak troponin 3124. Case was reviewed with Dr. Burt Knack and Dr. Angelena Form and conservative  approach was undertaken given that if he were to have PCI, he would have to be on DAPT - had had recent gross hematuria requiring cessation of Plavix. He did tolerate 48 hours of heparin infusion. Anti-anginals/anti-hypertensives were titrated and his orthostatic agents were able to be discontinued. Regarding his renal mass, we had a telephone conversation with Dr. Tresa Moore with urology who felt that given his recent cardiac issues he would not be a good candidate for renal mass resection due to risk of surgery. The plan was for him to see urology as an outpatient and Dr. Zettie Pho office was to contact the patient for rescheduling. Limited echo 09/10/19 showed stable findings 1 month post TAVR with normal function of the bioprosthetic valve, no perivalvular leak, normal LVEF, grade 1 DD.   He reports he initially did well at [facility] after discharge, albeit with blood pressures fluctuating - normal to high, no low readings per his report. Today he was sitting in his wheelchair awaiting rehab session when he developed chest tightness across his whole chest. No SOB or nausea. He requested an aide check his BP - it was done with a wrist cuff that is historically inaccurate and was 77/38. They called for a nurse who rechecked his BP and it was 517G systolic. He was escorted to the rehab station and they rechecked his BP and it was just over 200 so his session was cancelled. He alerted the facility for ongoing CP and EMS was called. He received 324mg  ASA and 1 SL NTG. He was given an additional 3 SL NTG. All in all the patient reports his discomfort lasted several hours. He reports they said he was somewhat clammy. Currently he  feels much better, still has a vague awareness in his chest. Telemetry shows NSR with occasional PACs, PVCs, and occasional periods of ventricular bigeminy and trigeminy. Labs show normal Hgb, plt 143, Cr 1.08, hsTroponin 23->28->35.  to be demand ischemia rather than new ACS.  Labile hypertension  possibly contributing.  Metoprolol was increased to 50 mg twice daily.  Continued on aspirin alone given hematuria.  Foley is still in place with plan for removal this week.    Past Medical History:  Diagnosis Date  . Anxiety   . 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 CAD (coronary artery disease)   . Carotid artery disease ([place])    CTA 08/2019 of the head and neck show moderate 65 to 75% bilateral ICA stenoses   . Chronic diastolic CHF (congestive heart failure) ([place])   . Diabetes mellitus type 2 in nonobese (HCC)   . Excessive urination at night 06/05/2013  . Giant cell arteritis ([place]) 05/17/2012  . Hyperlipidemia   . Hypertension   . Hypertensive urgency   . Hypomagnesemia 09/11/2019  . Mild anemia 09/11/2019  . NSTEMI (non-ST elevated myocardial infarction) ([place]) 09/09/2019  . Orthostatic hypotension 09/11/2019  . Premature atrial contractions 09/12/2019  . Pulmonary nodules   . PVC (premature ventricular contraction)   . Renal mass   . 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 Burt Knack & Dr. Cyndia Bent  . Severe aortic stenosis   . Sinus bradycardia on ECG   . Sleep apnea   . 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 Wilburn Cornelia.  . Stroke ([place])   . TIA (transient ischemic attack)   . Urinary retention 09/11/2019    Past Surgical History:  Procedure Laterality Date  . CATARACT EXTRACTION, BILATERAL  10/2015, and 11/2015   with adding  TORIC lenses bilaterally   . INTRAVASCULAR PRESSURE WIRE/FFR STUDY N/A 07/07/2019   Procedure: INTRAVASCULAR PRESSURE WIRE/FFR STUDY;  Surgeon: [name], MD;  Location: [place] [facility];  Service: Cardiovascular;  Laterality: N/A;  . LEFT HEART CATH AND CORONARY ANGIOGRAPHY N/A 07/07/2019   Procedure: LEFT HEART CATH AND CORONARY ANGIOGRAPHY;  Surgeon: [name], MD;  Location: [place] [facility];  Service: Cardiovascular;  Laterality: N/A;   . SQUAMOUS CELL CARCINOMA EXCISION     tongue  . TEE WITHOUT CARDIOVERSION N/A 08/15/2019   Procedure: TRANSESOPHAGEAL ECHOCARDIOGRAM (TEE);  Surgeon: Sherren Mocha, MD;  Location: [place];  Service: Open Heart Surgery;  Laterality: N/A;  . TRANSCATHETER AORTIC VALVE REPLACEMENT, TRANSFEMORAL N/A 08/15/2019   Procedure: TRANSCATHETER AORTIC VALVE REPLACEMENT, TRANSFEMORAL using Edwards [employer] SAPIEN 3 Ultra 26 mm THV.;  Surgeon: Sherren Mocha, MD;  Location: [place];  Service: Open Heart Surgery;  Laterality: N/A;     Inpatient Medications: Scheduled Meds: . amLODipine  5 mg Oral Daily  . aspirin  81 mg Oral Daily  . clonazePAM  0.5 mg Oral QID  . enoxaparin (LOVENOX) injection  40 mg Subcutaneous Daily  . fenofibrate  160 mg Oral Daily  . insulin aspart  0-15 Units Subcutaneous TID WC  . insulin aspart  0-5 Units Subcutaneous QHS  . isosorbide mononitrate  30 mg Oral Daily  . losartan  50 mg Oral Daily  . magnesium oxide  400 mg Oral Daily  . metoprolol tartrate  50 mg Oral BID  . rosuvastatin  20 mg Oral QHS  . sodium chloride flush  3 mL Intravenous Q12H   Continuous Infusions: . sodium chloride 75 mL/hr at 10/01/19 0500   PRN Meds: acetaminophen, nitroGLYCERIN, ondansetron (ZOFRAN) IV, polyethylene glycol, polyvinyl alcohol  Home Meds: Prior to Admission medications   Medication Sig Start Date End Date Taking? Authorizing Provider  acetaminophen (TYLENOL) 325 MG tablet Take 650 mg by mouth every 6 (six) hours as needed for mild pain or moderate pain.    [provider]  amLODipine (NORVASC) 5 MG tablet Take 1 tablet (5 mg total) by mouth daily. 09/13/19   Dunn, Nedra Hai, PA-C  aspirin 81 MG chewable tablet Chew 1 tablet (81 mg total) by mouth daily. 09/01/19   Eileen Stanford, PA-C  carboxymethylcellulose (REFRESH PLUS) 0.5 % SOLN Place 1 drop into both eyes every 12 (twelve) hours as needed (dry eyes).     [provider]  Choline Fenofibrate  (TRILIPIX) 135 MG capsule Take 135 mg by mouth daily.      [provider]  clonazePAM (KLONOPIN) 0.5 MG tablet Take 1 tablet (0.5 mg total) by mouth 4 (four) times daily. 09/23/19   Barrett, Evelene Croon, PA-C  Coenzyme Q10 200 MG capsule Take 200 mg by mouth daily.    [provider]  fosfomycin (MONUROL) 3 g PACK Take 3 g by mouth every 3 (three) days. Mix 3 grams into 6 ounces of water and drink once daily every 3 days, for 10 days 09/01/19   [provider]  glucose blood test strip 1 each by Other route as needed.  05/12/12   [provider]  Infant Care Products (DERMACLOUD) CREA Apply 1 application topically See admin instructions. Apply to testicle area every shift    [provider]  insulin aspart (NOVOLOG) 100 UNIT/ML injection Inject 0-9 Units into the skin 3 (three) times daily with meals. Correction coverage: Sensitive CBG < 70: Implement Hypoglycemia Standing Orders CBG 70 - 120: 0 units CBG 121 - 150: 1 unit CBG 151 - 200: 2 units CBG 201 - 250: 3 units CBG 251 - 300: 5 units CBG 301 - 350: 7 units CBG 351 - 400: 9 units CBG > 400: call MD Patient taking differently: Inject 0-9 Units into the skin See admin instructions. Inject 0-9 units into the skin three times a day before meals, per sliding scale: BGL 70-120 = give nothing; 121-150 = 1 unit; 151-200 = 2 units; 201-250 = 3 units; 251-300 = 5 units; 301-350 = 7 units; 351-400 = 9 units; 401-999 = CALL MD 09/12/19   Charlie Pitter, PA-C  insulin aspart (NOVOLOG) 100 UNIT/ML injection Inject 3 Units into the skin 3 (three) times daily with meals. If patient eats >50% of meals. Patient not taking: Reported on 09/19/2019 09/12/19   Charlie Pitter, PA-C  isosorbide mononitrate (IMDUR) 60 MG 24 hr tablet Take 1 tablet (60 mg total) by mouth daily. 09/13/19   Dunn, Nedra Hai, PA-C  Lancets (ONETOUCH DELICA PLUS [id number]) MISC 1 each by Other route daily.  03/23/18   [provider]  losartan  (COZAAR) 50 MG tablet Take 1 tablet (50 mg total) by mouth daily. 09/29/19   Sande Rives E, PA-C  magnesium oxide (MAG-OX) 400 (241.3 Mg) MG tablet Take 1 tablet (400 mg total) by mouth daily. 09/24/19   Barrett, Evelene Croon, PA-C  metoprolol tartrate (LOPRESSOR) 50 MG tablet Take 1 tablet (50 mg total) by mouth 2 (two) times daily. 09/23/19   Barrett, Evelene Croon, PA-C  nitroGLYCERIN (NITROSTAT) 0.4  MG SL tablet Place 1 tablet (0.4 mg total) under the tongue every 5 (five) minutes as needed for chest pain (up to 3 doses). Do not give if blood pressure is low (less than 443 systolic). Patient taking differently: Place 0.4 mg under the tongue every 5 (five) minutes x 3 doses as needed for chest pain.  09/12/19   Dunn, Dayna N, PA-C  ONE TOUCH ULTRA TEST test strip 1 each by Other route daily.  05/12/12   [provider]  polyethylene glycol (MIRALAX / GLYCOLAX) 17 g packet Take 17 g by mouth daily as needed for mild constipation. 09/23/19   Barrett, Evelene Croon, PA-C  rosuvastatin (CRESTOR) 20 MG tablet Take 1 tablet (20 mg total) by mouth daily. Patient taking differently: Take 20 mg by mouth at bedtime.  09/12/19   Charlie Pitter, PA-C    Allergies:    Allergies  Allergen Reactions  . Macrodantin [Nitrofurantoin] Other (See Comments)    "blocked kidneys"   . Tape Other (See Comments)    "Tears my skin and leaves marks." PLEASE USE COBAN!!  . Sulfa Antibiotics Rash  . Lisinopril Cough  . Nutritional Supplements Other (See Comments)    Protein drink given to me "did something"  . Penicillins Other (See Comments)    08/30/19- Patient can't remember, was 35+ years ago. OK with trying cephalexin in hospital    Social History:   Social History   Socioeconomic History  . Marital status: Married    Spouse name: Not on file  . Number of children: 2  . Years of education: Not on file  . Highest education level: Not on file  Occupational History  . Not on file  Tobacco Use  . Smoking status:  Former Smoker    Quit date: 06/25/1975    Years since quitting: 44.2  . Smokeless tobacco: Never Used  Vaping Use  . Vaping Use: Never used  Substance and Sexual Activity  . Alcohol use: No  . Drug use: No  . Sexual activity: Yes  Other Topics Concern  . Not on file  Social History Narrative   Patient lives at home with his wife.    Social Determinants of Health   Financial Resource Strain:   . Difficulty of Paying Living Expenses:   Food Insecurity:   . Worried About Charity fundraiser in the Last Year:   . Arboriculturist in the Last Year:   Transportation Needs:   . Film/video editor (Medical):   Marland Kitchen Lack of Transportation (Non-Medical):   Physical Activity:   . Days of Exercise per Week:   . Minutes of Exercise per Session:   Stress:   . Feeling of Stress :   Social Connections:   . Frequency of Communication with Friends and Family:   . Frequency of Social Gatherings with Friends and Family:   . Attends Religious Services:   . Active Member of Clubs or Organizations:   . Attends Archivist Meetings:   Marland Kitchen Marital Status:   Intimate Partner Violence:   . Fear of Current or Ex-Partner:   . Emotionally Abused:   Marland Kitchen Physically Abused:   . Sexually Abused:      Family History:    Family History  Problem Relation Age of Onset  . Heart disease Mother   . Heart attack Mother   . Heart disease Father   . Heart attack Father       ROS:  Please  see the history of present illness.   All other ROS reviewed and negative.     Physical Exam/Data:   Vitals:   10/01/19 0004 10/01/19 0215 10/01/19 0230 10/01/19 0402  BP: (!) 113/53 (!) 155/60 (!) 158/60 (!) 164/50  Pulse: 58 60 57 65  Resp: 16   18  Temp: 98.7 F (37.1 C)   97.8 F (36.6 C)  TempSrc: Oral   Oral  SpO2: 99% 93% 93% 97%  Weight: 91 kg     Height: 5\' 10"  (1.778 m)       Intake/Output Summary (Last 24 hours) at 10/01/2019 0621 Last data filed at 10/01/2019 0524 Gross per 24 hour    Intake --  Output 1200 ml  Net -1200 ml   Last 3 Weights 10/01/2019 09/28/2019 09/27/2019  Weight (lbs) 200 lb 9.9 oz 182 lb 4.8 oz 181 lb 6.4 oz  Weight (kg) 91 kg 82.691 kg 82.283 kg     Body mass index is 28.79 kg/m.  General: Well developed, well nourished, in no acute distress. Head: Normocephalic, atraumatic, sclera non-icteric, no xanthomas, nares are without discharge.  Neck: Negative for carotid bruits. JVD not elevated. Lungs: Clear bilaterally to auscultation without wheezes, rales, or rhonchi. Breathing is unlabored. Heart: RRR with S1 S2. No murmurs, rubs, or gallops appreciated. Abdomen: Soft, non-tender, non-distended with normoactive bowel sounds. No hepatomegaly. No rebound/guarding. No obvious abdominal masses. Msk:  Strength and tone appear normal for age. Extremities: No clubbing or cyanosis. No edema.  Distal pedal pulses are 2+ and equal bilaterally. Neuro: Alert and oriented X 3. No facial asymmetry. No focal deficit. Moves all extremities spontaneously. Psych:  Responds to questions appropriately with a normal affect.   EKG:  The EKG was personally reviewed and demonstrates:  Sinus rhythm Telemetry:  Telemetry was personally reviewed and demonstrates:  Sinus rhythm  Relevant CV Studies: Echo 09/10/2019 LV EF 60-65%  Laboratory Data:  High Sensitivity Troponin:   Recent Labs  Lab 09/19/19 1632 09/19/19 1942 10/01/19 0015 10/01/19 0215 10/01/19 0429  TROPONINIHS 28* 35* 26* 27* 23*     Cardiac EnzymesNo results for input(s): TROPONINI in the last 168 hours. No results for input(s): TROPIPOC in the last 168 hours.  Chemistry Recent Labs  Lab 10/01/19 0015 10/01/19 0429  NA 135 135  K 3.9 4.1  CL 103 103  CO2 22 21*  GLUCOSE 218* 232*  BUN 14 16  CREATININE 0.99 0.90  CALCIUM 9.8 9.7  GFRNONAA >60 >60  GFRAA >60 >60  ANIONGAP 10 11    No results for input(s): PROT, ALBUMIN, AST, ALT, ALKPHOS, BILITOT in the last 168 hours. Hematology Recent  Labs  Lab 10/01/19 0015 10/01/19 0429  WBC 8.0 8.8  RBC 4.99 5.00  HGB 13.6 13.6  HCT 41.9 42.0  MCV 84.0 84.0  MCH 27.3 27.2  MCHC 32.5 32.4  RDW 14.6 14.6  PLT 166 140*   BNPNo results for input(s): BNP, PROBNP in the last 168 hours.  DDimer No results for input(s): DDIMER in the last 168 hours.   Radiology/Studies:  DG Chest 2 View  Result Date: 10/01/2019 CLINICAL DATA:  Chest pain EXAM: CHEST - 2 VIEW COMPARISON:  September 19, 2019 FINDINGS: There is mild cardiomegaly. Aortic valve stent is noted. Aortic knob calcifications are seen. Both lungs are clear. No pleural effusion. No acute osseous abnormality is seen. IMPRESSION: No active cardiopulmonary disease. Electronically Signed   By: Prudencio Pair M.D.   On: 10/01/2019 00:49  DG Abd 2 Views  Result Date: 10/01/2019 CLINICAL DATA:  Abdominal pain EXAM: ABDOMEN - 2 VIEW COMPARISON:  CT, 08/24/2019. FINDINGS: Normal bowel gas pattern. Scattered aortoiliac atherosclerotic calcifications. No evidence of renal or ureteral stones. Soft tissues otherwise unremarkable. No acute skeletal abnormality. IMPRESSION: 1. No acute findings.  Normal bowel gas pattern. Electronically Signed   By: Lajean Manes M.D.   On: 10/01/2019 05:14    Assessment and Plan:   1. Chest pain Similar presentation to 2 weeks ago.  Troponin trend is flat and not consistent with acute MI.  His chest pain has resolved.  As discussed previously, he has known multivessel coronary disease and has been managed conservatively due to ongoing hematuria and inability to tolerate DAPT.  Also, he has not tolerated antianginal medications due to orthostatic hypotension.  We will review his case again today.  Currently his blood pressure is elevated so he may tire tolerate an increase in Imdur or the addition of ranolazine.  2. Syncope The history sounds most consistent with a vasovagal event.  He did not have a clear prodrome.  But admits to being under significant stress while  in the emergency room and getting worked up.  While we cannot rule out arrhythmia, this seems less likely.  His twelve-lead ECG is unremarkable without any evidence of conduction system disease.  He has not had syncope at home.  He will be monitored on telemetry here.  If he has further episodes at home, we can get him set up with a Holter.    For questions or updates, please contact [place] Please consult [email or web address] for contact info under     Signed, Alexei Doswell S, MD  10/01/2019 6:21 AM

## 2019-10-01 NOTE — Plan of Care (Signed)
  Problem: Education: Goal: Knowledge of condition and prescribed therapy will improve 10/01/2019 1424 by Arlina Robes, RN Outcome: Progressing 10/01/2019 1422 by Arlina Robes, RN Outcome: Progressing 10/01/2019 1422 by Arlina Robes, RN Outcome: Progressing   Problem: Cardiac: Goal: Will achieve and/or maintain adequate cardiac output 10/01/2019 1424 by Arlina Robes, RN Outcome: Progressing 10/01/2019 1422 by Arlina Robes, RN Outcome: Progressing 10/01/2019 1422 by Arlina Robes, RN Outcome: Progressing   Problem: Physical Regulation: Goal: Complications related to the disease process, condition or treatment will be avoided or minimized 10/01/2019 1424 by Arlina Robes, RN Outcome: Progressing 10/01/2019 1422 by Arlina Robes, RN Outcome: Progressing 10/01/2019 1422 by Arlina Robes, RN Outcome: Progressing   Problem: Education: Goal: Understanding of cardiac disease, CV risk reduction, and recovery process will improve 10/01/2019 1424 by Arlina Robes, RN Outcome: Progressing 10/01/2019 1422 by Arlina Robes, RN Outcome: Progressing Goal: Individualized Educational Video(s) 10/01/2019 1424 by Arlina Robes, RN Outcome: Progressing 10/01/2019 1422 by Arlina Robes, RN Outcome: Progressing   Problem: Activity: Goal: Ability to tolerate increased activity will improve 10/01/2019 1424 by Arlina Robes, RN Outcome: Progressing 10/01/2019 1422 by Arlina Robes, RN Outcome: Progressing   Problem: Cardiac: Goal: Ability to achieve and maintain adequate cardiovascular perfusion will improve 10/01/2019 1424 by Arlina Robes, RN Outcome: Progressing 10/01/2019 1422 by Arlina Robes, RN Outcome: Progressing   Problem: Health Behavior/Discharge Planning: Goal: Ability to safely manage health-related needs after discharge will improve 10/01/2019 1424 by Arlina Robes, RN Outcome: Progressing 10/01/2019 1422 by  Arlina Robes, RN Outcome: Progressing

## 2019-10-01 NOTE — Plan of Care (Signed)
  Problem: Education: Goal: Knowledge of condition and prescribed therapy will improve Outcome: Progressing   Problem: Cardiac: Goal: Will achieve and/or maintain adequate cardiac output Outcome: Progressing   Problem: Physical Regulation: Goal: Complications related to the disease process, condition or treatment will be avoided or minimized Outcome: Progressing   Problem: Education: Goal: Knowledge of condition and prescribed therapy will improve Outcome: Progressing   Problem: Cardiac: Goal: Will achieve and/or maintain adequate cardiac output Outcome: Progressing   Problem: Physical Regulation: Goal: Complications related to the disease process, condition or treatment will be avoided or minimized Outcome: Progressing

## 2019-10-01 NOTE — ED Notes (Signed)
Pt transported to xray 

## 2019-10-01 NOTE — Plan of Care (Signed)
  Problem: Education: Goal: Knowledge of condition and prescribed therapy will improve 10/01/2019 1422 by Arlina Robes, RN Outcome: Progressing 10/01/2019 1422 by Arlina Robes, RN Outcome: Progressing   Problem: Cardiac: Goal: Will achieve and/or maintain adequate cardiac output 10/01/2019 1422 by Arlina Robes, RN Outcome: Progressing 10/01/2019 1422 by Arlina Robes, RN Outcome: Progressing   Problem: Physical Regulation: Goal: Complications related to the disease process, condition or treatment will be avoided or minimized 10/01/2019 1422 by Arlina Robes, RN Outcome: Progressing 10/01/2019 1422 by Arlina Robes, RN Outcome: Progressing   Problem: Education: Goal: Understanding of cardiac disease, CV risk reduction, and recovery process will improve Outcome: Progressing Goal: Individualized Educational Video(s) Outcome: Progressing   Problem: Activity: Goal: Ability to tolerate increased activity will improve Outcome: Progressing   Problem: Cardiac: Goal: Ability to achieve and maintain adequate cardiovascular perfusion will improve Outcome: Progressing   Problem: Health Behavior/Discharge Planning: Goal: Ability to safely manage health-related needs after discharge will improve Outcome: Progressing

## 2019-10-01 NOTE — Progress Notes (Addendum)
At shift change pt complaining of 8/10 chest pain. EKG done. 3 nitro and Tylenol given, pain still 3/10. Rapid and MD notified.   IV Morphine 1mg  ordered and STAT Troponin  Pt now 0/10 pain, Troponin = 25

## 2019-10-01 NOTE — ED Provider Notes (Signed)
[facility] EMERGENCY DEPARTMENT Provider Note   CSN: [id number] Arrival date & time: 09/30/19  2347     History Chief Complaint  Patient presents with  . Chest Pain    Alexis Griffith is a 79 y.o. male.  Patient initially presented to the emergency department for evaluation of chest pain.  Patient has extensive coronary history with recent NSTEMI hospitalization.  He reports that he has not had chest pain since he left the hospital until tonight.  Pain is somewhat similar but not as severe.  Patient was in waiting room when he had a syncopal event.  He was noted to be unresponsive and had some shaking motions.  Patient reportedly had very faint or thready pulses and a heart rate of 40 at that time.  He was immediately brought back to an exam room at which time he is awake and alert.  Patient reports that he does remember feeling somewhat dizzy but does not remember any of the other events around the syncopal event.  Chest pain is mild at this time, no other complaints.        Past Medical History:  Diagnosis Date  . Anxiety   . 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 CAD (coronary artery disease)   . Carotid artery disease ([place])    CTA 08/2019 of the head and neck show moderate 65 to 75% bilateral ICA stenoses   . Chronic diastolic CHF (congestive heart failure) ([place])   . Diabetes mellitus type 2 in nonobese (HCC)   . Excessive urination at night 06/05/2013  . 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 Hyperlipidemia   . Hypertension   . Hypertensive urgency   . Hypomagnesemia 09/11/2019  . Mild anemia 09/11/2019  . NSTEMI (non-ST elevated myocardial infarction) ([place]) 09/09/2019  . Orthostatic hypotension 09/11/2019  . Premature atrial contractions 09/12/2019  . Pulmonary nodules   . PVC (premature ventricular contraction)   . Renal mass   . 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 Burt Knack &  Dr. Cyndia Bent  . Severe aortic stenosis   . Sinus bradycardia on ECG   . Sleep apnea   . 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 Wilburn Cornelia.  . Stroke ([place])   . TIA (transient ischemic attack)   . Urinary retention 09/11/2019    Patient Active Problem List   Diagnosis Date Noted  . CHF (congestive heart failure) ([place]) 09/25/2019  . Demand ischemia ([place]) 09/22/2019  . Premature atrial contractions 09/12/2019  . Orthostatic hypotension 09/11/2019  . Urinary retention 09/11/2019  . Mild anemia 09/11/2019  . Hypomagnesemia 09/11/2019  . NSTEMI (non-ST elevated myocardial infarction) ([place]) 09/09/2019  . Elevated troponin   . Chest pain   . Hypertensive urgency   . Acute on chronic diastolic heart failure ([place]) 08/15/2019  . 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 Diabetes mellitus type 2 in nonobese (HCC)   . Severe aortic stenosis   . Sleep apnea   . Renal mass   . Coronary artery disease involving native coronary artery of native heart with angina pectoris ([place]) 07/04/2019  . 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 Excessive urination at night 06/05/2013  . TIA (transient ischemic attack) 07/27/2012  . 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 Carotid artery disease ([place]) 08/24/2011  . PVC's (premature ventricular contractions)   . Labile hypertension   .  Hyperlipidemia LDL goal <70   . Anxiety   . Sinus bradycardia on ECG     Past Surgical History:  Procedure Laterality Date  . CATARACT EXTRACTION, BILATERAL  10/2015, and 11/2015   with adding  TORIC lenses bilaterally   . INTRAVASCULAR PRESSURE WIRE/FFR STUDY N/A 07/07/2019   Procedure: INTRAVASCULAR PRESSURE WIRE/FFR STUDY;  Surgeon: [name], MD;  Location: [facility];  Service: Cardiovascular;  Laterality: N/A;  . LEFT HEART CATH AND CORONARY ANGIOGRAPHY N/A 07/07/2019   Procedure: LEFT HEART CATH AND CORONARY ANGIOGRAPHY;  Surgeon: [name], MD;   Location: [facility];  Service: Cardiovascular;  Laterality: N/A;  . SQUAMOUS CELL CARCINOMA EXCISION     tongue  . TEE WITHOUT CARDIOVERSION N/A 08/15/2019   Procedure: TRANSESOPHAGEAL ECHOCARDIOGRAM (TEE);  Surgeon: Sherren Mocha, MD;  Location: [facility];  Service: Open Heart Surgery;  Laterality: N/A;  . TRANSCATHETER AORTIC VALVE REPLACEMENT, TRANSFEMORAL N/A 08/15/2019   Procedure: TRANSCATHETER AORTIC VALVE REPLACEMENT, TRANSFEMORAL using Edwards [employer] SAPIEN 3 Ultra 26 mm THV.;  Surgeon: Sherren Mocha, MD;  Location: [facility];  Service: Open Heart Surgery;  Laterality: N/A;       Family History  Problem Relation Age of Onset  . Heart disease Mother   . Heart attack Mother   . Heart disease Father   . Heart attack Father     Social History   Tobacco Use  . Smoking status: Former Smoker    Quit date: 06/25/1975    Years since quitting: 44.2  . Smokeless tobacco: Never Used  Vaping Use  . Vaping Use: Never used  Substance Use Topics  . Alcohol use: No  . Drug use: No    Home Medications Prior to Admission medications   Medication Sig Start Date End Date Taking? Authorizing Provider  acetaminophen (TYLENOL) 325 MG tablet Take 650 mg by mouth every 6 (six) hours as needed for mild pain or moderate pain.    [provider]  amLODipine (NORVASC) 5 MG tablet Take 1 tablet (5 mg total) by mouth daily. 09/13/19   Dunn, Nedra Hai, PA-C  aspirin 81 MG chewable tablet Chew 1 tablet (81 mg total) by mouth daily. 09/01/19   Eileen Stanford, PA-C  carboxymethylcellulose (REFRESH PLUS) 0.5 % SOLN Place 1 drop into both eyes every 12 (twelve) hours as needed (dry eyes).     [provider]  Choline Fenofibrate (TRILIPIX) 135 MG capsule Take 135 mg by mouth daily.      [provider]  clonazePAM (KLONOPIN) 0.5 MG tablet Take 1 tablet (0.5 mg total) by mouth 4 (four) times daily. 09/23/19   Barrett, Evelene Croon, PA-C  Coenzyme Q10 200 MG capsule Take 200  mg by mouth daily.    [provider]  fosfomycin (MONUROL) 3 g PACK Take 3 g by mouth every 3 (three) days. Mix 3 grams into 6 ounces of water and drink once daily every 3 days, for 10 days 09/01/19   [provider]  glucose blood test strip 1 each by Other route as needed.  05/12/12   [provider]  Infant Care Products (DERMACLOUD) CREA Apply 1 application topically See admin instructions. Apply to testicle area every shift    [provider]  insulin aspart (NOVOLOG) 100 UNIT/ML injection Inject 0-9 Units into the skin 3 (three) times daily with meals. Correction coverage: Sensitive CBG < 70: Implement Hypoglycemia Standing Orders CBG 70 - 120: 0 units CBG 121 -  150: 1 unit CBG 151 - 200: 2 units CBG 201 - 250: 3 units CBG 251 - 300: 5 units CBG 301 - 350: 7 units CBG 351 - 400: 9 units CBG > 400: call MD Patient taking differently: Inject 0-9 Units into the skin See admin instructions. Inject 0-9 units into the skin three times a day before meals, per sliding scale: BGL 70-120 = give nothing; 121-150 = 1 unit; 151-200 = 2 units; 201-250 = 3 units; 251-300 = 5 units; 301-350 = 7 units; 351-400 = 9 units; 401-999 = CALL MD 09/12/19   Charlie Pitter, PA-C  insulin aspart (NOVOLOG) 100 UNIT/ML injection Inject 3 Units into the skin 3 (three) times daily with meals. If patient eats >50% of meals. Patient not taking: Reported on 09/19/2019 09/12/19   Charlie Pitter, PA-C  isosorbide mononitrate (IMDUR) 60 MG 24 hr tablet Take 1 tablet (60 mg total) by mouth daily. 09/13/19   Dunn, Nedra Hai, PA-C  Lancets (ONETOUCH DELICA PLUS [id number]) MISC 1 each by Other route daily.  03/23/18   [provider]  losartan (COZAAR) 50 MG tablet Take 1 tablet (50 mg total) by mouth daily. 09/29/19   Sande Rives E, PA-C  magnesium oxide (MAG-OX) 400 (241.3 Mg) MG tablet Take 1 tablet (400 mg total) by mouth daily. 09/24/19   Barrett, Evelene Croon, PA-C  metoprolol tartrate  (LOPRESSOR) 50 MG tablet Take 1 tablet (50 mg total) by mouth 2 (two) times daily. 09/23/19   Barrett, Evelene Croon, PA-C  nitroGLYCERIN (NITROSTAT) 0.4 MG SL tablet Place 1 tablet (0.4 mg total) under the tongue every 5 (five) minutes as needed for chest pain (up to 3 doses). Do not give if blood pressure is low (less than 782 systolic). Patient taking differently: Place 0.4 mg under the tongue every 5 (five) minutes x 3 doses as needed for chest pain.  09/12/19   Dunn, Dayna N, PA-C  ONE TOUCH ULTRA TEST test strip 1 each by Other route daily.  05/12/12   [provider]  polyethylene glycol (MIRALAX / GLYCOLAX) 17 g packet Take 17 g by mouth daily as needed for mild constipation. 09/23/19   Barrett, Evelene Croon, PA-C  rosuvastatin (CRESTOR) 20 MG tablet Take 1 tablet (20 mg total) by mouth daily. Patient taking differently: Take 20 mg by mouth at bedtime.  09/12/19   Dunn, Nedra Hai, PA-C    Allergies    Macrodantin [nitrofurantoin], Tape, Sulfa antibiotics, Lisinopril, Nutritional supplements, and Penicillins  Review of Systems   Review of Systems  Cardiovascular: Positive for chest pain.  Neurological: Positive for syncope.  All other systems reviewed and are negative.   Physical Exam Updated Vital Signs BP (!) 158/60   Pulse 57   Temp 98.7 F (37.1 C) (Oral)   Resp 16   Ht 5\' 10"  (1.778 m)   Wt 91 kg   SpO2 93%   BMI 28.79 kg/m   Physical Exam Vitals and nursing note reviewed.  Constitutional:      General: He is not in acute distress.    Appearance: Normal appearance. He is well-developed.  HENT:     Head: Normocephalic and atraumatic.     Right Ear: Hearing normal.     Left Ear: Hearing normal.     Nose: Nose normal.  Eyes:     Conjunctiva/sclera: Conjunctivae normal.     Pupils: Pupils are equal, round, and reactive to light.  Cardiovascular:     Rate and Rhythm:  Regular rhythm.     Heart sounds: S1 normal and S2 normal. No murmur heard.  No friction rub. No  gallop.   Pulmonary:     Effort: Pulmonary effort is normal. No respiratory distress.     Breath sounds: Normal breath sounds.  Chest:     Chest wall: No tenderness.  Abdominal:     General: Bowel sounds are normal.     Palpations: Abdomen is soft.     Tenderness: There is no abdominal tenderness. There is no guarding or rebound. Negative signs include Murphy's sign and McBurney's sign.     Hernia: No hernia is present.  Musculoskeletal:        General: Normal range of motion.     Cervical back: Normal range of motion and neck supple.  Skin:    General: Skin is warm and dry.     Findings: No rash.  Neurological:     Mental Status: He is alert and oriented to person, place, and time.     GCS: GCS eye subscore is 4. GCS verbal subscore is 5. GCS motor subscore is 6.     Cranial Nerves: No cranial nerve deficit.     Sensory: No sensory deficit.     Coordination: Coordination normal.  Psychiatric:        Speech: Speech normal.        Behavior: Behavior normal.        Thought Content: Thought content normal.     ED Results / Procedures / Treatments   Labs (all labs ordered are listed, but only abnormal results are displayed) Labs Reviewed  BASIC METABOLIC PANEL - Abnormal; Notable for the following components:      Result Value   Glucose, Bld 218 (*)    All other components within normal limits  TROPONIN I (HIGH SENSITIVITY) - Abnormal; Notable for the following components:   Troponin I (High Sensitivity) 26 (*)    All other components within normal limits  SARS CORONAVIRUS 2 BY RT PCR (HOSPITAL ORDER, [facility])  CBC  PROTIME-INR  TROPONIN I (HIGH SENSITIVITY)    EKG None  Radiology DG Chest 2 View  Result Date: 10/01/2019 CLINICAL DATA:  Chest pain EXAM: CHEST - 2 VIEW COMPARISON:  September 19, 2019 FINDINGS: There is mild cardiomegaly. Aortic valve stent is noted. Aortic knob calcifications are seen. Both lungs are clear. No pleural effusion.  No acute osseous abnormality is seen. IMPRESSION: No active cardiopulmonary disease. Electronically Signed   By: Prudencio Pair M.D.   On: 10/01/2019 00:49    Procedures Procedures (including critical care time)  Medications Ordered in ED Medications  sodium chloride flush (NS) 0.9 % injection 3 mL (has no administration in time range)    ED Course  I have reviewed the triage vital signs and the nursing notes.  Pertinent labs & imaging results that were available during my care of the patient were reviewed by me and considered in my medical decision making (see chart for details).    MDM Rules/Calculators/A&P                          Patient presented to the emergency department for evaluation of chest pain.  Patient has history of known multivessel coronary artery disease and is not a candidate for intervention.  He had an NSTEMI several weeks ago and was treated medically.  Initial EKG unchanged from previous.  First troponin slightly elevated  but this appears to be his baseline.  While in the waiting room patient had a syncopal event.  Patient was sitting in a chair when this occurred.  He notes that he does remember having some dizziness but then does not remember anything after that.  Staff report that the patient had some jerking motions and then they were unable to feel pulses.  He was placed on a stretcher and brought to the triage area immediately.  Heart rate was in the 40s.  By the time he was placed on any cardiac monitor, however, he became awake and alert and is in sinus rhythm.  This certainly sounds like he had a transient bradycardia and hypotensive event, possibly vagal in nature, but with his recent cardiac history, more malignant arrhythmia cannot be ruled out.  This was discussed with cardiology.  He does not require anticoagulation at this time.  Based on his multiple other medical issues, recommend admission to medicine service.  Final Clinical Impression(s) / ED  Diagnoses Final diagnoses:  Chest pain, unspecified type  Syncope, unspecified syncope type    Rx / DC Orders ED Discharge Orders    None       Orpah Greek, MD 10/01/19 [phone]

## 2019-10-01 NOTE — ED Notes (Addendum)
This tech noted pt to have jerking type movement and eyes closed while in the lobby. Pt was in [place]. Tech went up to pt calling his name with no response. Tech did sternal rub with no response. Pt was pale and diaphoretic. Tech brought pt to triage and obtained VS. Pt HR was very faint and in the 40s. Tech put pt on stretcher and charge nurse was called. Pt was able to gain consciousness once moved to stretcher and started talking to this tech. Pt was able to tell me his name, DOB, where he is, and the year. Tech brought pt to [facility] and put pt on cardiac monitor. Nurse and DO notified of situation.

## 2019-10-01 NOTE — H&P (Signed)
History and Physical    Alexis Griffith PJA:[id number] DOB: 03/07/1940 DOA: 09/30/2019  PCP: Haywood Pao, MD  Patient coming from: [facility]  Chief Complaint: chest pain, syncope  HPI: Alexis Griffith is a 79 y.o. male with medical history significant of CAD, stroke, AS s/p AVR, renal mass concern for renal carcinoma, orthostatic hypotension and recurrent syncope, HTN, HLD, h/o GCA, DM2, HFpEF (TTE 09/10/19 showed normal EF with a Grade 1 diastolic dysfunction and functional AVR), carotid artery disease, urinary retention with chronic foley and chronic anxiety who presents for chest pain.  The patient reports that on Saturday (day of admission) patient developed a tightening up of his chest.  It was similar feeling to his previous NSTEMI, but not as bad.  About a 5/10.  Pain did not radiate.  He had no nausea, vomiting, SOB or diaphoresis.  See below for recent hospitalizations.  He is currently at [facility] for rehab and he notes that he gets very nervous when there on the weekends as they have lower staff and he is concerned something will happen to him.  He is on 4X per day anxiolytic at this time.  He was brought to the ED and after triage was placed in the waiting room.  He reports at that time having some issues with anxiety as well given he was surrounded by people and he was worried about how long it would take to be seen.  He developed dizziness and then does not remember anything prior to being in his ED room.  Per report from EDP, patient briefly lost consciousness and was reported that he lost a pulse, though this was for a brief time.  He awoke upon being brought into triage.  He has a history of recurrent syncope due to orthostatic hypotension and has at times been taken off many of his blood pressure medications.  Further symptoms include discomfort from the foley catheter (has had to cancel follow up with urology 3X to do voiding trials) and some LLQ pain which he is not sure if coming  from his bladder or bowels.  He denies constipation.  He denies heartburn, vomiting, diarrhea, fever, chills, recent illness.   Review of recent hospitalizations:  08/15/19 --> 08/31/19 TAVR on 08/15/19.  Hospital course complicated by a peri procedural small infarct of the left medial frontal cortex, recurrent syncope due to orthostasis (only on metoprolol on discharge), acute urinary retention requiring coude catheterization and follow up with Urology, UTI and epididymoorchitis for which he was treated with fosfomycin and pulmonary nodules found on PET scan.    09/08/19 --> 09/12/19 Presented for Chest pain, found to have NSTEMI.  He was deemed not to be interventional at that time due to urinary bleeding requiring cessation of plavix previously, so plan was for medical treatment.  Continue ASA, statin titration, anti-anginals including amlodipine and IMDUR.  Foley was in place for urinary retention.  He is noted to have renal cell carcinoma that is well known and following with Urology.  Patient was discharged to [facility].   09/19/19 --> 09/28/19 Admission for chest pain again.  Troponins did not rise > 100.  He was managed with home medications.  BP was labile with highs and lows.  Noted to have issues with orthostasis.  Discharged back to [facility].    ED Course: As noted, he had a syncopal episode in the waiting room.  Unclear if vasovagal, orthostatic or due to arrhythmia.  Lab work was relatively  unremarkable.  TnI was 26 --> 27.  Chest pain is resolved.  No acute findings on CXR.  EKG showed NSR with some Twave flattening in inferior/lateral leads.   Review of Systems: As per HPI otherwise all other systems reviewed and are negative.   Past Medical History:  Diagnosis Date  . Anxiety   . 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 CAD (coronary artery disease)   . Carotid artery disease ([place])    CTA 08/2019 of the head and neck show moderate 65 to 75% bilateral ICA stenoses   . Chronic  diastolic CHF (congestive heart failure) ([place])   . Diabetes mellitus type 2 in nonobese (HCC)   . Excessive urination at night 06/05/2013  . Giant cell arteritis ([place]) 05/17/2012  . Hyperlipidemia   . Hypertension   . Hypertensive urgency   . Hypomagnesemia 09/11/2019  . Mild anemia 09/11/2019  . NSTEMI (non-ST elevated myocardial infarction) ([place]) 09/09/2019  . Orthostatic hypotension 09/11/2019  . Premature atrial contractions 09/12/2019  . Pulmonary nodules   . PVC (premature ventricular contraction)   . Renal mass   . 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 Burt Knack & Dr. Cyndia Bent  . Severe aortic stenosis   . Sinus bradycardia on ECG   . Sleep apnea   . 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 Wilburn Cornelia.  . Stroke ([place])   . TIA (transient ischemic attack)   . Urinary retention 09/11/2019    Past Surgical History:  Procedure Laterality Date  . CATARACT EXTRACTION, BILATERAL  10/2015, and 11/2015   with adding  TORIC lenses bilaterally   . INTRAVASCULAR PRESSURE WIRE/FFR STUDY N/A 07/07/2019   Procedure: INTRAVASCULAR PRESSURE WIRE/FFR STUDY;  Surgeon: [name], MD;  Location: [facility];  Service: Cardiovascular;  Laterality: N/A;  . LEFT HEART CATH AND CORONARY ANGIOGRAPHY N/A 07/07/2019   Procedure: LEFT HEART CATH AND CORONARY ANGIOGRAPHY;  Surgeon: [name], MD;  Location: [facility];  Service: Cardiovascular;  Laterality: N/A;  . SQUAMOUS CELL CARCINOMA EXCISION     tongue  . TEE WITHOUT CARDIOVERSION N/A 08/15/2019   Procedure: TRANSESOPHAGEAL ECHOCARDIOGRAM (TEE);  Surgeon: Sherren Mocha, MD;  Location: [facility];  Service: Open Heart Surgery;  Laterality: N/A;  . TRANSCATHETER AORTIC VALVE REPLACEMENT, TRANSFEMORAL N/A 08/15/2019   Procedure: TRANSCATHETER AORTIC VALVE REPLACEMENT, TRANSFEMORAL using Edwards [employer] SAPIEN 3 Ultra 26 mm THV.;   Surgeon: Sherren Mocha, MD;  Location: [facility];  Service: Open Heart Surgery;  Laterality: N/A;    Social History  reports that he quit smoking about 44 years ago. He has never used smokeless tobacco. He reports that he does not drink alcohol and does not use drugs.  Allergies  Allergen Reactions  . Macrodantin [Nitrofurantoin] Other (See Comments)    "blocked kidneys"   . Tape Other (See Comments)    "Tears my skin and leaves marks." PLEASE USE COBAN!!  . Sulfa Antibiotics Rash  . Lisinopril Cough  . Nutritional Supplements Other (See Comments)    Protein drink given to me "did something"  . Penicillins Other (See Comments)    08/30/19- Patient can't remember, was 35+ years ago. OK with trying cephalexin in hospital    Family History  Problem Relation Age of Onset  . Heart disease Mother   . Heart attack Mother   . Heart disease Father   .  Heart attack Father      Prior to Admission medications   Medication Sig Start Date End Date Taking? Authorizing Provider  acetaminophen (TYLENOL) 325 MG tablet Take 650 mg by mouth every 6 (six) hours as needed for mild pain or moderate pain.    [provider]  amLODipine (NORVASC) 5 MG tablet Take 1 tablet (5 mg total) by mouth daily. 09/13/19   Dunn, Nedra Hai, PA-C  aspirin 81 MG chewable tablet Chew 1 tablet (81 mg total) by mouth daily. 09/01/19   Eileen Stanford, PA-C  carboxymethylcellulose (REFRESH PLUS) 0.5 % SOLN Place 1 drop into both eyes every 12 (twelve) hours as needed (dry eyes).     [provider]  Choline Fenofibrate (TRILIPIX) 135 MG capsule Take 135 mg by mouth daily.      [provider]  clonazePAM (KLONOPIN) 0.5 MG tablet Take 1 tablet (0.5 mg total) by mouth 4 (four) times daily. 09/23/19   Barrett, Evelene Croon, PA-C  Coenzyme Q10 200 MG capsule Take 200 mg by mouth daily.    [provider]  fosfomycin (MONUROL) 3 g PACK Take 3 g by mouth every 3 (three) days. Mix 3 grams into 6 ounces  of water and drink once daily every 3 days, for 10 days 09/01/19   [provider]  glucose blood test strip 1 each by Other route as needed.  05/12/12   [provider]  Infant Care Products (DERMACLOUD) CREA Apply 1 application topically See admin instructions. Apply to testicle area every shift    [provider]  insulin aspart (NOVOLOG) 100 UNIT/ML injection Inject 0-9 Units into the skin 3 (three) times daily with meals. Correction coverage: Sensitive CBG < 70: Implement Hypoglycemia Standing Orders CBG 70 - 120: 0 units CBG 121 - 150: 1 unit CBG 151 - 200: 2 units CBG 201 - 250: 3 units CBG 251 - 300: 5 units CBG 301 - 350: 7 units CBG 351 - 400: 9 units CBG > 400: call MD Patient taking differently: Inject 0-9 Units into the skin See admin instructions. Inject 0-9 units into the skin three times a day before meals, per sliding scale: BGL 70-120 = give nothing; 121-150 = 1 unit; 151-200 = 2 units; 201-250 = 3 units; 251-300 = 5 units; 301-350 = 7 units; 351-400 = 9 units; 401-999 = CALL MD 09/12/19   Charlie Pitter, PA-C  insulin aspart (NOVOLOG) 100 UNIT/ML injection Inject 3 Units into the skin 3 (three) times daily with meals. If patient eats >50% of meals. Patient not taking: Reported on 09/19/2019 09/12/19   Charlie Pitter, PA-C  isosorbide mononitrate (IMDUR) 60 MG 24 hr tablet Take 1 tablet (60 mg total) by mouth daily. 09/13/19   Dunn, Nedra Hai, PA-C  Lancets (ONETOUCH DELICA PLUS [id number]) MISC 1 each by Other route daily.  03/23/18   [provider]  losartan (COZAAR) 50 MG tablet Take 1 tablet (50 mg total) by mouth daily. 09/29/19   Sande Rives E, PA-C  magnesium oxide (MAG-OX) 400 (241.3 Mg) MG tablet Take 1 tablet (400 mg total) by mouth daily. 09/24/19   Barrett, Evelene Croon, PA-C  metoprolol tartrate (LOPRESSOR) 50 MG tablet Take 1 tablet (50 mg total) by mouth 2 (two) times daily. 09/23/19   Barrett, Evelene Croon, PA-C  nitroGLYCERIN (NITROSTAT) 0.4  MG SL tablet Place 1 tablet (0.4 mg total) under the tongue every 5 (five) minutes as needed for chest pain (up to 3  doses). Do not give if blood pressure is low (less than 979 systolic). Patient taking differently: Place 0.4 mg under the tongue every 5 (five) minutes x 3 doses as needed for chest pain.  09/12/19   Dunn, Dayna N, PA-C  ONE TOUCH ULTRA TEST test strip 1 each by Other route daily.  05/12/12   [provider]  polyethylene glycol (MIRALAX / GLYCOLAX) 17 g packet Take 17 g by mouth daily as needed for mild constipation. 09/23/19   Barrett, Evelene Croon, PA-C  rosuvastatin (CRESTOR) 20 MG tablet Take 1 tablet (20 mg total) by mouth daily. Patient taking differently: Take 20 mg by mouth at bedtime.  09/12/19   Charlie Pitter, PA-C    Physical Exam: Vitals:   10/01/19 0004 10/01/19 0215 10/01/19 0230 10/01/19 0402  BP: (!) 113/53 (!) 155/60 (!) 158/60 (!) 164/50  Pulse: 58 60 57 65  Resp: 16   18  Temp: 98.7 F (37.1 C)   97.8 F (36.6 C)  TempSrc: Oral   Oral  SpO2: 99% 93% 93% 97%  Weight: 91 kg     Height: 5\' 10"  (1.778 m)       Constitutional: NAD, calm, comfortable Eyes: PERRL, + conjunctival injection, no icterus ENMT: Mucous membranes are mildly dry Neck: normal, supple, no thyromegaly Respiratory: CTAB, no wheezing or rales Cardiovascular: RR, NR, + murmur 3/6 in RUSB, no peripheral edema Abdomen: + TTP in LLQ, otherwise soft, NT, ND, +BS Musculoskeletal: no clubbing / cyanosis. No contractures Skin: no rashes, lesions, ulcers on exposed skin GU: foley in place draining yellow slightly cloudy urine Neurologic: Grossly intact cranial nerves, strength is 5/5 throughout and sensation intact to light touch.  Psychiatric: Anxious appearing. Alert and oriented x 3.    Labs on Admission: I have personally reviewed following labs and imaging studies  CBC: Recent Labs  Lab 10/01/19 0015  WBC 8.0  HGB 13.6  HCT 41.9  MCV 84.0  PLT 892    Basic Metabolic  Panel: Recent Labs  Lab 10/01/19 0015  NA 135  K 3.9  CL 103  CO2 22  GLUCOSE 218*  BUN 14  CREATININE 0.99  CALCIUM 9.8    GFR: Estimated Creatinine Clearance: 69.8 mL/min (by C-G formula based on SCr of 0.99 mg/dL).  Liver Function Tests: No results for input(s): AST, ALT, ALKPHOS, BILITOT, PROT, ALBUMIN in the last 168 hours.  Urine analysis:    Component Value Date/Time   COLORURINE AMBER (A) 09/20/2019 2044   APPEARANCEUR CLOUDY (A) 09/20/2019 2044   LABSPEC 1.020 09/20/2019 2044   PHURINE 5.0 09/20/2019 2044   GLUCOSEU NEGATIVE 09/20/2019 2044   HGBUR SMALL (A) 09/20/2019 2044   BILIRUBINUR NEGATIVE 09/20/2019 2044   KETONESUR NEGATIVE 09/20/2019 2044   PROTEINUR 100 (A) 09/20/2019 2044   NITRITE NEGATIVE 09/20/2019 2044   LEUKOCYTESUR LARGE (A) 09/20/2019 2044    Radiological Exams on Admission: DG Chest 2 View  Result Date: 10/01/2019 CLINICAL DATA:  Chest pain EXAM: CHEST - 2 VIEW COMPARISON:  September 19, 2019 FINDINGS: There is mild cardiomegaly. Aortic valve stent is noted. Aortic knob calcifications are seen. Both lungs are clear. No pleural effusion. No acute osseous abnormality is seen. IMPRESSION: No active cardiopulmonary disease. Electronically Signed   By: Prudencio Pair M.D.   On: 10/01/2019 00:49    EKG: Independently reviewed. NSR, TW flattening in inferior and lateral leads.   Assessment/Plan Syncope and collapse - DDx includes NSTEMI, seizure, orthostasis, severe anxiety, vasovagal, arrhythmia -  Will plan to work through this differential including trending troponin, monitoring on telemetry and checking orthostatic vitals.  He has been started on new blood pressure medications since being at SNF, so might be that this is getting worse.  Will consider work up for seizure if any further concerning symptoms arise.  Severe anxiety, though a diagnosis of exclusion, is possible given patient's presentation and symptoms of anxiety prior to the event.  -  Monitor on telemetry - Check orthostatics - Continue clonazepam at previous dose - PT/OT - Will not recheck TTE, just done 3 weeks ago    Labile hypertension Orthostatic hypotension - He is currently on amlodipine, IMDUR, losartan, metoprolol - Decrease IMDUR to 30mg  daily - Continue others and monitor closely - Check orthostatics  Chest pain  Coronary artery disease involving native coronary artery of native heart with angina pectoris  - He has had an extensive course of hospitalizations recently.  He notes the pain was similar to previous, but might have been anxiety related - Trend troponin - Telemetry - AM EKG - Continue fibrate, IMDUR, rosuvastatin  LLQ abdominal pain - Will start with an abdominal Xray to assess for ileus, SBO - Unclear cause, if bladder pain, may need reassessment of foley catheter and replacement  Carotid artery disease ([employer]) - Continue home aspirin and statin    Diabetes mellitus type 2 in nonobese (HCC) - SSI with evening coverage, currently on no PO medications    Severe aortic stenosis S/P TAVR (transcatheter aortic valve replacement) - At last check AVR was working well - Continue good management of risk factors - Consider Cardiology consult and repeat TTE    Urinary retention, infection - He is currently still taking fosfamycin for UTI which will be continued - Consider urology consult for voiding trial while in house    Chronic HFpEF - Stable, no signs of volume overload - Continue good BP control, fibrate, statin, IMDUR     DVT prophylaxis: Lovenox Code Status:   Full Disposition Plan:   Patient is from:  SNF  Anticipated DC to:  SNF  Anticipated DC date:  10/02/19  Anticipated DC barriers: None  Consults called:  None  Admission status:  Obs, telemetry   Severity of Illness: The appropriate patient status for this patient is OBSERVATION. Observation status is judged to be reasonable and necessary in order to provide the required  intensity of service to ensure the patient's safety. The patient's presenting symptoms, physical exam findings, and initial radiographic and laboratory data in the context of their medical condition is felt to place them at decreased risk for further clinical deterioration. Furthermore, it is anticipated that the patient will be medically stable for discharge from the hospital within 2 midnights of admission. The following factors support the patient status of observation.   " The patient's presenting symptoms include chest pain, syncope. " The physical exam findings include anxious appearing. " The initial radiographic and laboratory data are Troponin are flat, EKG appears stable, telemetry monitoring needed.      Gilles Chiquito MD [facility]  How to contact the [facility] Attending or Consulting provider [place] or covering provider during after hours [place], for this patient?   1. Check the care team in [facility] and look for a) attending/consulting [facility] provider listed and b) the [facility] team listed 2. Log into [email or web address] and use [facility]'s universal password to access. If you do not have the password, please contact the hospital operator. 3. Locate the  [place] provider you are looking for under [facility] and page to a number that you can be directly reached. 4. If you still have difficulty reaching the provider, please page the [facility] (Director on Call) for the Hospitalists listed on amion for assistance.  10/01/2019, 4:14 AM

## 2019-10-01 NOTE — ED Triage Notes (Signed)
Patient arrived with EMS from [place] reports left chest pain this evening with mild SOB , no emesis or diaphoresis , he received 2 NTG sl and ASA 324 mg prior to arrival with relief , his cardiologist is Dr. Cathie Olden.

## 2019-10-02 ENCOUNTER — Observation Stay (HOSPITAL_COMMUNITY): Payer: Medicare Other

## 2019-10-02 DIAGNOSIS — R0989 Other specified symptoms and signs involving the circulatory and respiratory systems: Secondary | ICD-10-CM

## 2019-10-02 DIAGNOSIS — R1032 Left lower quadrant pain: Secondary | ICD-10-CM | POA: Diagnosis present

## 2019-10-02 DIAGNOSIS — R079 Chest pain, unspecified: Secondary | ICD-10-CM

## 2019-10-02 DIAGNOSIS — Z79899 Other long term (current) drug therapy: Secondary | ICD-10-CM | POA: Diagnosis not present

## 2019-10-02 DIAGNOSIS — I252 Old myocardial infarction: Secondary | ICD-10-CM | POA: Diagnosis not present

## 2019-10-02 DIAGNOSIS — C649 Malignant neoplasm of unspecified kidney, except renal pelvis: Secondary | ICD-10-CM | POA: Diagnosis present

## 2019-10-02 DIAGNOSIS — Z882 Allergy status to sulfonamides status: Secondary | ICD-10-CM | POA: Diagnosis not present

## 2019-10-02 DIAGNOSIS — I6523 Occlusion and stenosis of bilateral carotid arteries: Secondary | ICD-10-CM | POA: Diagnosis not present

## 2019-10-02 DIAGNOSIS — R55 Syncope and collapse: Secondary | ICD-10-CM

## 2019-10-02 DIAGNOSIS — Z20822 Contact with and (suspected) exposure to covid-19: Secondary | ICD-10-CM | POA: Diagnosis present

## 2019-10-02 DIAGNOSIS — Z888 Allergy status to other drugs, medicaments and biological substances status: Secondary | ICD-10-CM | POA: Diagnosis not present

## 2019-10-02 DIAGNOSIS — E785 Hyperlipidemia, unspecified: Secondary | ICD-10-CM | POA: Diagnosis present

## 2019-10-02 DIAGNOSIS — Z7982 Long term (current) use of aspirin: Secondary | ICD-10-CM | POA: Diagnosis not present

## 2019-10-02 DIAGNOSIS — Z91048 Other nonmedicinal substance allergy status: Secondary | ICD-10-CM | POA: Diagnosis not present

## 2019-10-02 DIAGNOSIS — Z960 Presence of urogenital implants: Secondary | ICD-10-CM | POA: Diagnosis present

## 2019-10-02 DIAGNOSIS — I951 Orthostatic hypotension: Principal | ICD-10-CM

## 2019-10-02 DIAGNOSIS — R339 Retention of urine, unspecified: Secondary | ICD-10-CM | POA: Diagnosis present

## 2019-10-02 DIAGNOSIS — I5032 Chronic diastolic (congestive) heart failure: Secondary | ICD-10-CM | POA: Diagnosis present

## 2019-10-02 DIAGNOSIS — Z953 Presence of xenogenic heart valve: Secondary | ICD-10-CM | POA: Diagnosis not present

## 2019-10-02 DIAGNOSIS — I25119 Atherosclerotic heart disease of native coronary artery with unspecified angina pectoris: Secondary | ICD-10-CM

## 2019-10-02 DIAGNOSIS — I110 Hypertensive heart disease with heart failure: Secondary | ICD-10-CM | POA: Diagnosis present

## 2019-10-02 DIAGNOSIS — R319 Hematuria, unspecified: Secondary | ICD-10-CM | POA: Diagnosis present

## 2019-10-02 DIAGNOSIS — E1165 Type 2 diabetes mellitus with hyperglycemia: Secondary | ICD-10-CM | POA: Diagnosis present

## 2019-10-02 DIAGNOSIS — B372 Candidiasis of skin and nail: Secondary | ICD-10-CM | POA: Diagnosis not present

## 2019-10-02 DIAGNOSIS — Z952 Presence of prosthetic heart valve: Secondary | ICD-10-CM

## 2019-10-02 DIAGNOSIS — Z8673 Personal history of transient ischemic attack (TIA), and cerebral infarction without residual deficits: Secondary | ICD-10-CM | POA: Diagnosis not present

## 2019-10-02 DIAGNOSIS — F419 Anxiety disorder, unspecified: Secondary | ICD-10-CM | POA: Diagnosis present

## 2019-10-02 DIAGNOSIS — Z880 Allergy status to penicillin: Secondary | ICD-10-CM | POA: Diagnosis not present

## 2019-10-02 DIAGNOSIS — I350 Nonrheumatic aortic (valve) stenosis: Secondary | ICD-10-CM | POA: Diagnosis present

## 2019-10-02 LAB — CBC
HCT: 42.6 % (ref 39.0–52.0)
Hemoglobin: 13.7 g/dL (ref 13.0–17.0)
MCH: 27.3 pg (ref 26.0–34.0)
MCHC: 32.2 g/dL (ref 30.0–36.0)
MCV: 84.9 fL (ref 80.0–100.0)
Platelets: 130 10*3/uL — ABNORMAL LOW (ref 150–400)
RBC: 5.02 MIL/uL (ref 4.22–5.81)
RDW: 14.7 % (ref 11.5–15.5)
WBC: 7.5 10*3/uL (ref 4.0–10.5)
nRBC: 0 % (ref 0.0–0.2)

## 2019-10-02 LAB — GLUCOSE, CAPILLARY
Glucose-Capillary: 194 mg/dL — ABNORMAL HIGH (ref 70–99)
Glucose-Capillary: 258 mg/dL — ABNORMAL HIGH (ref 70–99)
Glucose-Capillary: 175 mg/dL — ABNORMAL HIGH (ref 70–99)
Glucose-Capillary: 179 mg/dL — ABNORMAL HIGH (ref 70–99)

## 2019-10-02 LAB — MAGNESIUM: Magnesium: 1.9 mg/dL (ref 1.7–2.4)

## 2019-10-02 LAB — ECHOCARDIOGRAM COMPLETE
AR max vel: 1.75 cm2
AV Area VTI: 1.79 cm2
AV Area mean vel: 1.74 cm2
AV Mean grad: 13 mmHg
AV Peak grad: 23.3 mmHg
Ao pk vel: 2.42 m/s
Area-P 1/2: 2.29 cm2
Calc EF: 55 %
Height: 70 in
S' Lateral: 3.2 cm
Single Plane A2C EF: 59.6 %
Single Plane A4C EF: 48.1 %
Weight: 2910.4 oz

## 2019-10-02 LAB — BASIC METABOLIC PANEL
Anion gap: 8 (ref 5–15)
BUN: 12 mg/dL (ref 8–23)
CO2: 25 mmol/L (ref 22–32)
Calcium: 9.3 mg/dL (ref 8.9–10.3)
Chloride: 103 mmol/L (ref 98–111)
Creatinine, Ser: 0.86 mg/dL (ref 0.61–1.24)
GFR calc Af Amer: >60 mL/min (ref >60)
GFR calc non Af Amer: >60 mL/min (ref >60)
Glucose, Bld: 194 mg/dL — ABNORMAL HIGH (ref 70–99)
Potassium: 3.8 mmol/L (ref 3.5–5.1)
Sodium: 136 mmol/L (ref 135–145)

## 2019-10-02 LAB — PHOSPHORUS: Phosphorus: 2.9 mg/dL (ref 2.5–4.6)

## 2019-10-02 MED ORDER — RANOLAZINE ER 500 MG PO TB12
500.0000 mg | ORAL_TABLET | Freq: Two times a day (BID) | ORAL | Status: DC
  Administered 2019-10-02 – 2019-10-10 (×18): 500 mg via ORAL
  Filled 2019-10-02 (×17): qty 1

## 2019-10-02 NOTE — Progress Notes (Signed)
PROGRESS NOTE  Alexis Griffith:[id number] DOB: 1940/03/11 DOA: 09/30/2019 PCP: Haywood Pao, MD  HPI/Recap of past 24 hours: Alexis Griffith is a 79 y.o. male with medical history significant of CAD, stroke, AS s/p AVR, renal mass with concern for renal carcinoma, orthostatic hypotension and recurrent syncope, HTN, HLD, h/o GCA, DM2, HFpEF (TTE 09/10/19 showed normal EF with a Grade 1 diastolic dysfunction and functional AVR), carotid artery disease, urinary retention with chronic foley and chronic anxiety who presents for chest pain.  The patient reports that on Saturday (day of admission) patient developed a tightening up of his chest.  It was similar feeling to his previous NSTEMI, but not as bad.  About a 5/10.  Pain did not radiate.  He is currently at [facility] for rehab and he notes that he gets very nervous on the weekends as they have lower staff and he is concerned something will happen to him.  He is on 4X per day anxiolytic at this time.  He was brought to the ED and after triage was placed in the waiting room.  He reports at that time having some issues with anxiety as well given he was surrounded by people and he was worried about how long it would take to be seen.  He developed dizziness and then does not remember anything prior to being in his ED room.  Per report from EDP, patient briefly lost consciousness and was reported that he lost a pulse, though this was for a brief time.  He awoke upon being brought into triage.  He has a history of recurrent syncope due to orthostatic hypotension and has at times been taken off many of his blood pressure medications.  Further symptoms include discomfort from the foley catheter (has had to cancel follow up with urology 3X to do voiding trials) and some LLQ pain which he is not sure if coming from his bladder or bowels.  He denies constipation.  He denies heartburn, vomiting, diarrhea, fever, chills, recent illness.   Review of recent  hospitalizations:  08/15/19 --> 08/31/19 TAVR on 08/15/19.  Hospital course complicated by a peri procedural small infarct of the left medial frontal cortex, recurrent syncope due to orthostasis (only on metoprolol on discharge), acute urinary retention requiring coude catheterization and follow up with Urology, UTI and epididymoorchitis for which he was treated with fosfomycin and pulmonary nodules found on PET scan.    09/08/19 --> 09/12/19 Presented for Chest pain, found to have NSTEMI.  He was deemed not to be interventional at that time due to urinary bleeding requiring cessation of plavix previously, so plan was for medical treatment.  Continue ASA, statin titration, anti-anginals including amlodipine and IMDUR.  Foley was in place for urinary retention.  He is noted to have renal cell carcinoma that is well known and following with Urology.  Patient was discharged to [facility].   09/19/19 --> 09/28/19 Admission for chest pain again.  Troponins did not rise > 100.  He was managed with home medications.  BP was labile with highs and lows.  Noted to have issues with orthostasis.  Discharged back to [facility].    Elevated troponin S peaked at 25.  Episode of chest pain the night of 10/01/19 and 10/02/19.  Seen by cardiology, ongoing cardiac meds adjustment.  10/02/19:  Chest pain resolved after sublingual nitro and IV morphine. No dyspnea at rest or palpitations.  Assessment/Plan: Active Problems:   Labile hypertension   Carotid  artery disease ([place])   Coronary artery disease involving native coronary artery of native heart with angina pectoris ([place])   Diabetes mellitus type 2 in nonobese (HCC)   Severe aortic stenosis   S/P TAVR (transcatheter aortic valve replacement)   Chest pain   Orthostatic hypotension   Urinary retention   CHF (congestive heart failure) (HCC)   Syncope and collapse   Syncope  Syncope, unclear etiology, suspect multifactorial -Check orthostatics -Mildly elevated  troponin, peaked at 27 Continue aspirin, fenofibrate, crestor Continue antianxiolytic -2D echo 10/02/19 results are pending -Cardiology following   Labile hypertension -Medications adjusted by cardiology Is currently on Norvasc 5 mg daily, losartan 50 mg daily, p.o. Lopressor 50 mg twice daily, Ranexa 500 mg twice daily, Imdur 30 mg daily -Closely monitor blood pressure and heart rate Maintain MAP greater than 65 as possible  Chest pain rule out ACS Coronary artery disease involving native coronary artery of native heart with angina pectoris  - He has had an extensive course of hospitalizations recently.  He notes the pain was similar to previous, but might have been anxiety related -Elevated troponin as stated above -Continue to monitor on telemetry -Cardiac medications as noted above  LLQ abdominal pain, resolved Abdominal x-ray with no acute findings.  Carotid artery disease (HCC) - Continue home aspirin and statin    Diabetes mellitus type 2 in nonobese [facility]) with hyperglycemia Hemoglobin A1c 6.4 and 08/17/2019 Continue insulin sliding scale    Severe aortic stenosis S/P TAVR (transcatheter aortic valve replacement) - At last check AVR was working well - Continue good management of risk factors 2D echo pending results Cardiology following    Urinary retention with indwelling Foley catheter -Missed urology's appointment    Chronic HFpEF -Last 2D echo done on 09/10/2019 showed LVEF 60 to 65% with grade 1 diastolic dysfunction Euvolemic on exam 2D echo has been repeated due to recurrent chest pain, pending results - Continue good BP control, fibrate, statin, IMDUR  Ambulatory dysfunction PT OT to assess Continue PT OT with assistance and fall precautions.     DVT prophylaxis:      Lovenox subcu daily Code Status:              Full Disposition Plan:              Patient is from:                        SNF             Anticipated DC to:                   SNF              Anticipated DC date:               10/03/19             Anticipated DC barriers:          Cardio medications adjustment.  Consults called:   Cardiology.   Status is: Inpatient    Dispo:  Patient From: Home  Planned Disposition: [place]  Expected discharge date: 10/03/19  Medically stable for discharge: No,: Medications adjustment.         Objective: Vitals:   10/02/19 0356 10/02/19 0738 10/02/19 1035 10/02/19 1213  BP: (!) 128/62 (!) 150/66 (!) 151/56 (!) 131/57  Pulse: 69 65 60 65  Resp: 18 18 14 16   Temp: 98.1 F (  36.7 C) 98.3 F (36.8 C) 98.7 F (37.1 C) (!) 97.4 F (36.3 C)  TempSrc: Oral Oral Oral Oral  SpO2: 96% 100% 93% 95%  Weight: 82.5 kg     Height:        Intake/Output Summary (Last 24 hours) at 10/02/2019 1423 Last data filed at 10/02/2019 0900 Gross per 24 hour  Intake 840 ml  Output 2400 ml  Net -1560 ml   Filed Weights   10/01/19 0004 10/02/19 0356  Weight: 91 kg 82.5 kg    Exam:  . General: 79 y.o. year-old male pleasant well-developed well-nourished in no acute distress.  Alert and oriented x3.   . Cardiovascular: Regular rate and rhythm no rubs or gallops.   Marland Kitchen Respiratory: Clear to auscultation no wheezes or rales. . Abdomen: Soft nontender normal bowel sounds present. . Musculoskeletal: No lower extremity edema bilaterally.   Marland Kitchen Psychiatry: Mood is appropriate for condition and setting.  Data Reviewed: CBC: Recent Labs  Lab 10/01/19 0015 10/01/19 0429 10/02/19 0525  WBC 8.0 8.8 7.5  HGB 13.6 13.6 13.7  HCT 41.9 42.0 42.6  MCV 84.0 84.0 84.9  PLT 166 140* 784*   Basic Metabolic Panel: Recent Labs  Lab 10/01/19 0015 10/01/19 0429 10/02/19 0525  NA 135 135 136  K 3.9 4.1 3.8  CL 103 103 103  CO2 22 21* 25  GLUCOSE 218* 232* 194*  BUN 14 16 12   CREATININE 0.99 0.90 0.86  CALCIUM 9.8 9.7 9.3  MG  --   --  1.9  PHOS  --   --  2.9   GFR: Estimated Creatinine Clearance: 73.1 mL/min (by C-G formula  based on SCr of 0.86 mg/dL). Liver Function Tests: No results for input(s): AST, ALT, ALKPHOS, BILITOT, PROT, ALBUMIN in the last 168 hours. No results for input(s): LIPASE, AMYLASE in the last 168 hours. No results for input(s): AMMONIA in the last 168 hours. Coagulation Profile: Recent Labs  Lab 10/01/19 0015  INR 1.0   Cardiac Enzymes: No results for input(s): CKTOTAL, CKMB, CKMBINDEX, TROPONINI in the last 168 hours. BNP (last 3 results) No results for input(s): PROBNP in the last 8760 hours. HbA1C: No results for input(s): HGBA1C in the last 72 hours. CBG: Recent Labs  Lab 10/01/19 1244 10/01/19 1612 10/01/19 2131 10/02/19 0605 10/02/19 1137  GLUCAP 218* 247* 178* 194* 258*   Lipid Profile: No results for input(s): CHOL, HDL, LDLCALC, TRIG, CHOLHDL, LDLDIRECT in the last 72 hours. Thyroid Function Tests: Recent Labs    10/01/19 0429  TSH 1.472   Anemia Panel: No results for input(s): VITAMINB12, FOLATE, FERRITIN, TIBC, IRON, RETICCTPCT in the last 72 hours. Urine analysis:    Component Value Date/Time   COLORURINE AMBER (A) 09/20/2019 2044   APPEARANCEUR CLOUDY (A) 09/20/2019 2044   LABSPEC 1.020 09/20/2019 2044   PHURINE 5.0 09/20/2019 2044   GLUCOSEU NEGATIVE 09/20/2019 2044   HGBUR SMALL (A) 09/20/2019 2044   BILIRUBINUR NEGATIVE 09/20/2019 2044   KETONESUR NEGATIVE 09/20/2019 2044   PROTEINUR 100 (A) 09/20/2019 2044   NITRITE NEGATIVE 09/20/2019 2044   LEUKOCYTESUR LARGE (A) 09/20/2019 2044   Sepsis Labs: @LABRCNTIP (procalcitonin:4,lacticidven:4)  ) Recent Results (from the past 240 hour(s))  SARS Coronavirus 2 by RT PCR (hospital order, performed in [place] hospital lab) Nasopharyngeal Nasopharyngeal Swab     Status: None   Collection Time: 09/23/19  6:45 PM   Specimen: Nasopharyngeal Swab  Result Value Ref Range Status   SARS Coronavirus 2 NEGATIVE NEGATIVE Final  Comment: (NOTE) SARS-CoV-2 target nucleic acids are NOT DETECTED.  The  SARS-CoV-2 RNA is generally detectable in upper and lower respiratory specimens during the acute phase of infection. The lowest concentration of SARS-CoV-2 viral copies this assay can detect is 250 copies / mL. A negative result does not preclude SARS-CoV-2 infection and should not be used as the sole basis for treatment or other patient management decisions.  A negative result may occur with improper specimen collection / handling, submission of specimen other than nasopharyngeal swab, presence of viral mutation(s) within the areas targeted by this assay, and inadequate number of viral copies (<250 copies / mL). A negative result must be combined with clinical observations, patient history, and epidemiological information.  Fact Sheet for Patients:   StrictlyIdeas.no  Fact Sheet for Healthcare Providers: BankingDealers.co.za  This test is not yet approved or  cleared by the [place] FDA and has been authorized for detection and/or diagnosis of SARS-CoV-2 by FDA under an Emergency Use Authorization (EUA).  This EUA will remain in effect (meaning this test can be used) for the duration of the COVID-19 declaration under Section 564(b)(1) of the Act, 21 U.S.C. section 360bbb-3(b)(1), unless the authorization is terminated or revoked sooner.  Performed at [facility], [place] [address], [place], [place] [address]   SARS CORONAVIRUS 2 (TAT 6-24 HRS) Nasopharyngeal Nasopharyngeal Swab     Status: None   Collection Time: 09/25/19  5:36 PM   Specimen: Nasopharyngeal Swab  Result Value Ref Range Status   SARS Coronavirus 2 NEGATIVE NEGATIVE Final    Comment: (NOTE) SARS-CoV-2 target nucleic acids are NOT DETECTED.  The SARS-CoV-2 RNA is generally detectable in upper and lower respiratory specimens during the acute phase of infection. Negative results do not preclude SARS-CoV-2 infection, do not rule out co-infections with other  pathogens, and should not be used as the sole basis for treatment or other patient management decisions. Negative results must be combined with clinical observations, patient history, and epidemiological information. The expected result is Negative.  Fact Sheet for Patients: SugarRoll.be  Fact Sheet for Healthcare Providers: https://www.woods-mathews.com/  This test is not yet approved or cleared by the [place] FDA and  has been authorized for detection and/or diagnosis of SARS-CoV-2 by FDA under an Emergency Use Authorization (EUA). This EUA will remain  in effect (meaning this test can be used) for the duration of the COVID-19 declaration under Se ction 564(b)(1) of the Act, 21 U.S.C. section 360bbb-3(b)(1), unless the authorization is terminated or revoked sooner.  Performed at [facility], [place] [address], [place], [place] [address]   SARS Coronavirus 2 by RT PCR (hospital order, performed in [facility] hospital lab) Nasopharyngeal Nasopharyngeal Swab     Status: None   Collection Time: 10/01/19  2:32 AM   Specimen: Nasopharyngeal Swab  Result Value Ref Range Status   SARS Coronavirus 2 NEGATIVE NEGATIVE Final    Comment: (NOTE) SARS-CoV-2 target nucleic acids are NOT DETECTED.  The SARS-CoV-2 RNA is generally detectable in upper and lower respiratory specimens during the acute phase of infection. The lowest concentration of SARS-CoV-2 viral copies this assay can detect is 250 copies / mL. A negative result does not preclude SARS-CoV-2 infection and should not be used as the sole basis for treatment or other patient management decisions.  A negative result may occur with improper specimen collection / handling, submission of specimen other than nasopharyngeal swab, presence of viral mutation(s) within the areas targeted by this assay,  and inadequate number of viral copies (<250 copies / mL). A negative result must be  combined with clinical observations, patient history, and epidemiological information.  Fact Sheet for Patients:   StrictlyIdeas.no  Fact Sheet for Healthcare Providers: BankingDealers.co.za  This test is not yet approved or  cleared by the [place] FDA and has been authorized for detection and/or diagnosis of SARS-CoV-2 by FDA under an Emergency Use Authorization (EUA).  This EUA will remain in effect (meaning this test can be used) for the duration of the COVID-19 declaration under Section 564(b)(1) of the Act, 21 U.S.C. section 360bbb-3(b)(1), unless the authorization is terminated or revoked sooner.  Performed at [facility], [place] [address], [place], [place] [address]       Studies: No results found.  Scheduled Meds: . amLODipine  5 mg Oral Daily  . aspirin  81 mg Oral Daily  . Chlorhexidine Gluconate Cloth  6 each Topical Daily  . clonazePAM  0.5 mg Oral QID  . enoxaparin (LOVENOX) injection  40 mg Subcutaneous Daily  . fenofibrate  160 mg Oral Daily  . insulin aspart  0-15 Units Subcutaneous TID WC  . insulin aspart  0-5 Units Subcutaneous QHS  . isosorbide mononitrate  30 mg Oral Daily  . losartan  50 mg Oral Daily  . magnesium oxide  400 mg Oral Daily  . metoprolol tartrate  50 mg Oral BID  . ranolazine  500 mg Oral BID  . rosuvastatin  20 mg Oral QHS  . sodium chloride flush  3 mL Intravenous Q12H    Continuous Infusions:   LOS: 0 days     Kayleen Memos, MD [facility] Pager [phone]  If 7PM-7AM, please contact night-coverage [email or web address] Password [email or web address] 10/02/2019, 2:23 PM

## 2019-10-02 NOTE — TOC Initial Note (Addendum)
Transition of Care [facility]) - Initial/Assessment Note    Patient Details  Name: Alexis Griffith MRN: [id number] Date of Birth: 1940-05-05  [facility] CM/SW Contact:    Jacquelynn Cree Phone Number: 10/02/2019, 2:05 PM  Clinical Narrative:                 CSW met with patient to discuss discharge plan. Patient would like to return to [facility].   CSW spoke with Levada Dy at [facility] and confirmed insurance authorization will be needed to return. CSW requested PT/OT consults from MD.    Expected Discharge Plan: Skilled Nursing Facility Barriers to Discharge: Continued Medical Work up   Patient Goals and CMS Choice   CMS Medicare.gov Compare Post Acute Care list provided to:: Patient Choice offered to / list presented to : Patient  Expected Discharge Plan and Services Expected Discharge Plan: [facility] In-house Referral: Clinical Social Work     Living arrangements for the past 2 months: [place]                                      Prior Living Arrangements/Services Living arrangements for the past 2 months: Single Family Home Lives with:: Spouse Patient language and need for interpreter reviewed:: Yes Do you feel safe going back to the place where you live?: Yes      Need for Family Participation in Patient Care: No (Comment) Care giver support system in place?: Yes (comment)   Criminal Activity/Legal Involvement Pertinent to Current Situation/Hospitalization: No - Comment as needed  Activities of Daily Living      Permission Sought/Granted Permission sought to share information with : Chartered certified accountant granted to share information with : Yes, Verbal Permission Granted     Permission granted to share info w AGENCY: SNF        Emotional Assessment   Attitude/Demeanor/Rapport: Engaged Affect (typically observed): Pleasant Orientation: : Oriented to Self, Oriented to  Time, Oriented to Place,  Oriented to Situation Alcohol / Substance Use: Not Applicable Psych Involvement: No (comment)  Admission diagnosis:  Syncope and collapse [R55] Abdominal pain [R10.9] Syncope, unspecified syncope type [R55] Chest pain, unspecified type [R07.9] Patient Active Problem List   Diagnosis Date Noted  . Syncope   . Syncope and collapse 10/01/2019  . Abdominal pain   . CHF (congestive heart failure) ([place]) 09/25/2019  . Demand ischemia ([place]) 09/22/2019  . Premature atrial contractions 09/12/2019  . Orthostatic hypotension 09/11/2019  . Urinary retention 09/11/2019  . Mild anemia 09/11/2019  . Hypomagnesemia 09/11/2019  . NSTEMI (non-ST elevated myocardial infarction) ([place]) 09/09/2019  . Elevated troponin   . Chest pain   . Hypertensive urgency   . Acute on chronic diastolic heart failure ([place]) 08/15/2019  . 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 Diabetes mellitus type 2 in nonobese (HCC)   . Severe aortic stenosis   . Sleep apnea   . Renal mass   . Coronary artery disease involving native coronary artery of native heart with angina pectoris ([place]) 07/04/2019  . 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 Excessive urination at night 06/05/2013  . TIA (transient ischemic attack) 07/27/2012  . Giant cell arteritis ([place]) 05/17/2012  . Carotid artery disease ([place]) 08/24/2011  . PVC's (premature ventricular contractions)   . Labile hypertension   . Hyperlipidemia LDL goal <70   . Anxiety   .  Sinus bradycardia on ECG    PCP:  Tisovec, Fransico Him, MD Pharmacy:   [facility], [address], [address] [place], [place] [address] Phone: [phone] Fax: [facility], [place] [address] Phone: [phone] Fax: [phone]     Social Determinants of Health (SDOH) Interventions     Readmission Risk Interventions No flowsheet data found.

## 2019-10-02 NOTE — Progress Notes (Addendum)
Per CCMD, Patient having ST Elevations. Patient denies any chest pain or troubles breathing at this time. Current vitals are as follows:   10/02/19 1035  Vitals  Temp 98.7 F (37.1 C)  Temp Source Oral  BP (!) 151/56  MAP (mmHg) 84  BP Location Right Arm  BP Method Automatic  Patient Position (if appropriate) Lying  Pulse Rate 60  Pulse Rate Source Monitor  Resp 14  Level of Consciousness  Level of Consciousness Alert  MEWS COLOR  MEWS Score Color Green  Oxygen Therapy  SpO2 93 %  O2 Device Room Air  MEWS Score  MEWS Temp 0  MEWS Systolic 0  MEWS Pulse 0  MEWS RR 0  MEWS LOC 0  MEWS Score 0   EKG completed and in chart. [place],PA paged and is aware.

## 2019-10-02 NOTE — Progress Notes (Signed)
Echocardiogram 2D Echocardiogram  With 3D has been performed.  Darlina Sicilian M 10/02/2019, 2:11 PM

## 2019-10-02 NOTE — Progress Notes (Signed)
Progress Note  Patient Name: Alexis Griffith Date of Encounter: 10/02/2019  [facility] Cardiologist: Mertie Moores, MD   Subjective   Patient had another episode of chest pain last night. Resolved after dose of IV morphine. Patient states he was feeling anxious at the time and feels anxiety is largely contributing to symptoms which I agree with. No shortness of breath. No palpitations. No recurrent dizziness or syncope.  Inpatient Medications    Scheduled Meds: . amLODipine  5 mg Oral Daily  . aspirin  81 mg Oral Daily  . Chlorhexidine Gluconate Cloth  6 each Topical Daily  . clonazePAM  0.5 mg Oral QID  . enoxaparin (LOVENOX) injection  40 mg Subcutaneous Daily  . fenofibrate  160 mg Oral Daily  . insulin aspart  0-15 Units Subcutaneous TID WC  . insulin aspart  0-5 Units Subcutaneous QHS  . isosorbide mononitrate  30 mg Oral Daily  . losartan  50 mg Oral Daily  . magnesium oxide  400 mg Oral Daily  . metoprolol tartrate  50 mg Oral BID  . rosuvastatin  20 mg Oral QHS  . sodium chloride flush  3 mL Intravenous Q12H   Continuous Infusions:  PRN Meds: acetaminophen, nitroGLYCERIN, ondansetron (ZOFRAN) IV, polyethylene glycol, polyvinyl alcohol   Vital Signs    Vitals:   10/01/19 1939 10/02/19 0047 10/02/19 0356 10/02/19 0738  BP: (!) 178/76 (!) 133/56 (!) 128/62 (!) 150/66  Pulse: 92 70 69 65  Resp: 18 20 18 18   Temp: 97.7 F (36.5 C) 98 F (36.7 C) 98.1 F (36.7 C) 98.3 F (36.8 C)  TempSrc: Oral Oral Oral Oral  SpO2: 99% 97% 96% 100%  Weight:   82.5 kg   Height:        Intake/Output Summary (Last 24 hours) at 10/02/2019 0824 Last data filed at 10/02/2019 0359 Gross per 24 hour  Intake 480 ml  Output 2400 ml  Net -1920 ml   Last 3 Weights 10/02/2019 10/01/2019 09/28/2019  Weight (lbs) 181 lb 14.4 oz 200 lb 9.9 oz 182 lb 4.8 oz  Weight (kg) 82.509 kg 91 kg 82.691 kg      Telemetry    Sinus rhythm with rates in the 50's to 90's. - Personally Reviewed  ECG     No new ECG tracing today. - Personally Reviewed  Physical Exam   GEN: No acute distress.   Neck: Supple. No JVD Cardiac: RRR. II/VI systolic murmur heard best at lower sternal border. No rubs or gallops.  Respiratory: Clear to auscultation bilaterally. No wheezes, rhonchi, or rales. GI: Soft, non-tender, non-distended  MS: No significant lower extremity edema. No deformity. Skin: Warm and dry. Neuro:  No focal deficits. Psych: Normal affect. Responds appropriately.  Labs    High Sensitivity Troponin:   Recent Labs  Lab 10/01/19 0015 10/01/19 0215 10/01/19 0429 10/01/19 0707 10/01/19 2140  TROPONINIHS 26* 27* 23* 23* 25*      Chemistry Recent Labs  Lab 10/01/19 0015 10/01/19 0429 10/02/19 0525  NA 135 135 136  K 3.9 4.1 3.8  CL 103 103 103  CO2 22 21* 25  GLUCOSE 218* 232* 194*  BUN 14 16 12   CREATININE 0.99 0.90 0.86  CALCIUM 9.8 9.7 9.3  GFRNONAA >60 >60 >60  GFRAA >60 >60 >60  ANIONGAP 10 11 8      Hematology Recent Labs  Lab 10/01/19 0015 10/01/19 0429 10/02/19 0525  WBC 8.0 8.8 7.5  RBC 4.99 5.00 5.02  HGB 13.6  13.6 13.7  HCT 41.9 42.0 42.6  MCV 84.0 84.0 84.9  MCH 27.3 27.2 27.3  MCHC 32.5 32.4 32.2  RDW 14.6 14.6 14.7  PLT 166 140* 130*    BNPNo results for input(s): BNP, PROBNP in the last 168 hours.   DDimer No results for input(s): DDIMER in the last 168 hours.   Radiology    DG Chest 2 View  Result Date: 10/01/2019 CLINICAL DATA:  Chest pain EXAM: CHEST - 2 VIEW COMPARISON:  September 19, 2019 FINDINGS: There is mild cardiomegaly. Aortic valve stent is noted. Aortic knob calcifications are seen. Both lungs are clear. No pleural effusion. No acute osseous abnormality is seen. IMPRESSION: No active cardiopulmonary disease. Electronically Signed   By: Prudencio Pair M.D.   On: 10/01/2019 00:49   DG Abd 2 Views  Result Date: 10/01/2019 CLINICAL DATA:  Abdominal pain EXAM: ABDOMEN - 2 VIEW COMPARISON:  CT, 08/24/2019. FINDINGS: Normal bowel  gas pattern. Scattered aortoiliac atherosclerotic calcifications. No evidence of renal or ureteral stones. Soft tissues otherwise unremarkable. No acute skeletal abnormality. IMPRESSION: 1. No acute findings.  Normal bowel gas pattern. Electronically Signed   By: Lajean Manes M.D.   On: 10/01/2019 05:14    Cardiac Studies   Echocardiogram 09/10/2019: Impressions: 1. Stable findings 1 month post TAVR, normal function of the  bioprosthetic valve, mean transaortic gradient 12 mmHg, no paravalvular  leak.  2. Left ventricular ejection fraction, by estimation, is 60 to 65%. The  left ventricle has normal function. The left ventricle has no regional  wall motion abnormalities. There is moderate concentric left ventricular  hypertrophy. Left ventricular  diastolic parameters are consistent with Grade I diastolic dysfunction  (impaired relaxation). Elevated left atrial pressure.  3. Right ventricular systolic function is normal. The right ventricular  size is normal.  4. The mitral valve is normal in structure. Trivial mitral valve  regurgitation. No evidence of mitral stenosis.  5. The aortic valve has been repaired/replaced. Aortic valve  regurgitation is not visualized. No aortic stenosis is present. There is a  26 mm Edwards Sapien prosthetic (TAVR) valve present in the aortic  position. Procedure Date: 08/15/2019. Echo findings  are consistent with normal structure and function of the aortic valve  prosthesis.  6. The inferior vena cava is normal in size with greater than 50%  respiratory variability, suggesting right atrial pressure of 3 mmHg.   Patient Profile     79 y.o. 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 Recently discharged on 09/28/2019 after being admitted for chest pain. Readmitted on 09/30/2019 for recurrent chest pain and syncope.  Assessment & Plan    Chest Pain with Known Multivessel CAD - Patient readmitted with recurrent chest pain and syncope. Occurred in the setting of feeling very anxious/stressed. - Recent admission with NSTEMI with high-sensitivity troponin of 3,124. Decision made to treat medically given concern for DAPT with recent gross hematuria requiring cessation of Plavix after TAVR. High-sensitivity troponin this admission peaked at 27. Not consistent with ACS. - EKG shows no acute ischemic changes. - Patient did have another episode of chest pain last night. He admits to feeling very anxious again when this occurred. Currently  chest pain free.  - I suspect chest pain is really secondary to anxiety with underlying multivessel CAD. Do not anticipate any additional ischemia evaluation. - Continue medical therapy: Aspirin, Lopressor, Imdur, Amlodipine, and Crestor. Plavix had to be stopped during recent admission due to recent hematuria with renal mass. Of note, Imdur was decreased to 30mg  on admission due to concern for orthostatic hypotension.   Syncope - Felt to likely be vasovagal in nature as patient feeling very stressed and dizziness prior to episode.  - No concerning arrhythmias noted on telemetry.  - Will check orthostatic vital signs. - May need outpatient Zio monitor at discharge.  Severe Aortic Stenosis s/p TAVR on 08/15/2019 - Stable on limited Echo on 09/10/2019. - On Aspirin alone due to recent hematuria. - Patient has outpatient follow-up with Nell Range, Structural Heart PA, on 10/16/2019.  Hypertension - BP labile ranging room 113/53 to 170/53. However, BP mostly above goal. - Current medications include: Amlodipine 5mg  daily,  Imdur 30mg  daily, Losartan 50mg  daily, and Lopressor 50mg  twice daily.  - Given concern for possible orthostasis, would increase Losartan rather than Amlodipine if needed.  Renal Cell Carcinoma - Complicated by recent urinary retention requiring Foley, hematuria, and UTI/epididymoorchitis s/p Fosfomycin. Now has chronic indwelling Foley and is followed by Dr. Tresa Moore. Now also has pulmonary nodules concerning for metastasis.  - Continue outpatient Urology-Oncology follow-up.  Carotid Artery Disease - Moderate on CTA head/neck. - Continue routine outpatient monitoring.   Hyperlipidemia - LDL was 76 on 09/11/2019. - LDL goal <70 given CAD. - Continue Crestor 20mg  daily. This dose was recently increased so will need repeat lipid panel and LFTs in about 6-8 weeks.  Type 2 Diabetes Mellitus - Being maintained on sliding scale insulin during admission. - Can restart home medications at discharge.  Anxiety  - Suspect anxiety is playing a large role in above symptoms.  - Continue Clonazepam 0.5mg  four times daily.  - Further management per primary team.  For questions or updates, please contact [place] Please consult [email or web address] for contact info under        Signed, Darreld Mclean, PA-C  10/02/2019, 8:24 AM

## 2019-10-02 NOTE — Progress Notes (Signed)
Inpatient Diabetes Program Recommendations  AACE/ADA: New Consensus Statement on Inpatient Glycemic Control (2015)  Target Ranges:  Prepandial:   less than 140 mg/dL      Peak postprandial:   less than 180 mg/dL (1-2 hours)      Critically ill patients:  140 - 180 mg/dL   Lab Results  Component Value Date   GLUCAP 258 (H) 10/02/2019   HGBA1C 6.4 (H) 08/17/2019    Review of Glycemic Control Results for Alexis Griffith, Alexis Griffith (MRN [id number]) as of 10/02/2019 13:35  Ref. Range 10/01/2019 12:44 10/01/2019 16:12 10/01/2019 21:31 10/02/2019 06:05 10/02/2019 11:37  Glucose-Capillary Latest Ref Range: 70 - 99 mg/dL 218 (H) 247 (H) 178 (H) 194 (H) 258 (H)   Diabetes history:  DM2 Outpatient Diabetes medications:  Novolog 0-9 units tid Novolog 3 units tid with meals  Current orders for Inpatient glycemic control:  Novolog 0-15 units tid Novolog 0-5 qhs  Inpatient Diabetes Program Recommendations:    Post prandials eleveated; Novolog 3 units tid with meals if eats at least 50% of meal  Will continue to follow while inpatient.  Thank you, Reche Dixon, RN, BSN Diabetes Coordinator Inpatient Diabetes Program [phone] (team pager from 8a-5p)

## 2019-10-03 ENCOUNTER — Other Ambulatory Visit: Payer: Self-pay | Admitting: Student

## 2019-10-03 DIAGNOSIS — R55 Syncope and collapse: Secondary | ICD-10-CM

## 2019-10-03 LAB — GLUCOSE, CAPILLARY
Glucose-Capillary: 172 mg/dL — ABNORMAL HIGH (ref 70–99)
Glucose-Capillary: 172 mg/dL — ABNORMAL HIGH (ref 70–99)
Glucose-Capillary: 245 mg/dL — ABNORMAL HIGH (ref 70–99)
Glucose-Capillary: 204 mg/dL — ABNORMAL HIGH (ref 70–99)
Glucose-Capillary: 244 mg/dL — ABNORMAL HIGH (ref 70–99)

## 2019-10-03 MED ORDER — FENOFIBRATE 160 MG PO TABS: 160.0000 mg | ORAL_TABLET | Freq: Every day | ORAL | 0 refills | Status: DC

## 2019-10-03 MED ORDER — CLONAZEPAM 0.5 MG PO TABS: 0.5000 mg | ORAL_TABLET | Freq: Four times a day (QID) | ORAL | 0 refills | Status: DC

## 2019-10-03 MED ORDER — ASPIRIN 81 MG PO CHEW: 81.0000 mg | CHEWABLE_TABLET | Freq: Every day | ORAL | 0 refills | Status: AC

## 2019-10-03 MED ORDER — ISOSORBIDE MONONITRATE ER 30 MG PO TB24: 30.0000 mg | ORAL_TABLET | Freq: Every day | ORAL | 0 refills | Status: DC

## 2019-10-03 MED ORDER — LOSARTAN POTASSIUM 50 MG PO TABS: 50.0000 mg | ORAL_TABLET | Freq: Every day | ORAL | 0 refills | Status: DC

## 2019-10-03 MED ORDER — METOPROLOL TARTRATE 50 MG PO TABS: 50.0000 mg | ORAL_TABLET | Freq: Two times a day (BID) | ORAL | 0 refills | Status: DC

## 2019-10-03 MED ORDER — ROSUVASTATIN CALCIUM 20 MG PO TABS: 20.0000 mg | ORAL_TABLET | Freq: Every day | ORAL | 0 refills | Status: DC

## 2019-10-03 MED ORDER — RANOLAZINE ER 500 MG PO TB12: 500.0000 mg | ORAL_TABLET | Freq: Two times a day (BID) | ORAL | 0 refills | Status: DC

## 2019-10-03 NOTE — Evaluation (Signed)
Physical Therapy Evaluation Patient Details Name: Alexis Griffith MRN: [id number] DOB: 1940/12/04 Today's Date: 10/03/2019   History of Present Illness  Alexis Griffith is a 79 y.o. 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  Pt was d/c to SNF after recent hospitalizatin and was still there. He presented to ED with chest pain. Pt admitted with chest pain with known multivessel CAD.  Clinical Impression  Pt admitted with above diagnosis. Pt was able to ambulate with RW on unit with good stability. Orthostatic VS WFL after pt received his 10 am meds as well.  See below.   Pt currently with functional limitations due to the deficits listed below (see PT Problem List). Pt will benefit from skilled PT to increase their independence and safety with mobility to allow discharge to the venue listed below.    Orthostatic BPs  Supine 146/51, 69 bpm   Sitting 142/63, 76 bpm  Standing 133/58, 84 bpm  Standing after 3 min 157/61, 96 bpm    Follow Up Recommendations SNF    Equipment Recommendations  None recommended by PT    Recommendations for Other Services       Precautions / Restrictions Precautions Precautions: Fall Precaution Comments: watch BP Restrictions Weight Bearing Restrictions: No      Mobility  Bed Mobility Overal bed mobility: Needs Assistance Bed Mobility: Supine to Sit;Rolling;Sit to Supine Rolling: Supervision   Supine to sit: Modified independent (Device/Increase time) Sit to supine: Supervision   General bed mobility comments: requires use of bed rail and increased time to complete task exiting on L not right this session  Transfers Overall transfer level: Needs assistance Equipment used: Rolling walker (2  wheeled) Transfers: Sit to/from Stand Sit to Stand: Supervision         General transfer comment: cues for sequencing  Ambulation/Gait Ambulation/Gait assistance: Min guard;Supervision Gait Distance (Feet): 400 Feet Assistive device: Rolling walker (2 wheeled) Gait Pattern/deviations: Step-through pattern;Decreased stride length Gait velocity: decreased Gait velocity interpretation: <1.8 ft/sec, indicate of risk for recurrent falls General Gait Details: Steady gait overall. Fatigues.  Stairs            Wheelchair Mobility    Modified Rankin (Stroke Patients Only) Modified Rankin (Stroke Patients Only) Pre-Morbid Rankin Score: No significant disability Modified Rankin: Moderately severe disability     Balance Overall balance assessment: Needs assistance Sitting-balance support: Feet supported;No upper extremity supported Sitting balance-Leahy Scale: Good Sitting balance - Comments: supervision   Standing balance support: During functional activity;No upper extremity supported Standing balance-Leahy Scale: Fair Standing balance comment: static stand without support                             Pertinent Vitals/Pain Pain Assessment: No/denies pain    Home Living Family/patient expects to be discharged to:: Skilled nursing facility ([facility]) Living Arrangements: Spouse/significant other Available Help at Discharge: Family;Available 24 hours/day Type of Home: House Home Access: Stairs to enter   [employer] of Steps: 1 step (in the house) Home Layout: One level Home Equipment: Walker - 2 wheels;Wheelchair - manual Additional Comments: has 2 steps down into another room but doesn t have to do that unless he wants to go . pt has living room 1 step.      Prior  Function Level of Independence: Needs assistance   Gait / Transfers Assistance Needed: SNF [facility]  ADL's / Homemaking Assistance Needed: Pt was completely independent prior to TAVR,  requires min (A) since june for safety  Comments: Pt finishing rehab at SNF     Hand Dominance   Dominant Hand: Right    Extremity/Trunk Assessment   Upper Extremity Assessment Upper Extremity Assessment: Defer to OT evaluation    Lower Extremity Assessment Lower Extremity Assessment: Generalized weakness    Cervical / Trunk Assessment Cervical / Trunk Assessment: Normal  Communication   Communication: No difficulties  Cognition Arousal/Alertness: Awake/alert Behavior During Therapy: WFL for tasks assessed/performed Overall Cognitive Status: Within Functional Limits for tasks assessed                                 General Comments: lacks awareness to BP decrease due to lack of symptoms present       General Comments General comments (skin integrity, edema, etc.): VSS - see orthostatic VS above    Exercises General Exercises - Lower Extremity Long Arc Quad: AROM;Both;10 reps;Seated Hip Flexion/Marching: AROM;Both;Seated;10 reps   Assessment/Plan    PT Assessment Patient needs continued PT services  PT Problem List Decreased strength;Decreased mobility;Decreased safety awareness;Decreased activity tolerance;Decreased balance;Decreased knowledge of use of DME;Cardiopulmonary status limiting activity       PT Treatment Interventions DME instruction;Therapeutic activities;Gait training;Therapeutic exercise;Patient/family education;Balance training;Stair training;Functional mobility training;Neuromuscular re-education    PT Goals (Current goals can be found in the Care Plan section)  Acute Rehab PT Goals Patient Stated Goal: return to [facility] to get stronger PT Goal Formulation: With patient Time For Goal Achievement: 10/17/19 Potential to Achieve Goals: Good    Frequency Min 2X/week   Barriers to discharge Decreased caregiver support      Co-evaluation               AM-PAC PT "6 Clicks" Mobility  Outcome Measure Help needed turning from  your back to your side while in a flat bed without using bedrails?: None Help needed moving from lying on your back to sitting on the side of a flat bed without using bedrails?: A Little Help needed moving to and from a bed to a chair (including a wheelchair)?: A Little Help needed standing up from a chair using your arms (e.g., wheelchair or bedside chair)?: None Help needed to walk in hospital room?: A Little Help needed climbing 3-5 steps with a railing? : A Little 6 Click Score: 20    End of Session Equipment Utilized During Treatment: Gait belt Activity Tolerance: Patient tolerated treatment well Patient left: with call bell/phone within reach;in bed;with bed alarm set Nurse Communication: Mobility status PT Visit Diagnosis: Other abnormalities of gait and mobility (R26.89);Muscle weakness (generalized) (M62.81);Other symptoms and signs involving the nervous system (R29.898)    Time: 9833-8250 PT Time Calculation (min) (ACUTE ONLY): 24 min   Charges:   PT Evaluation $PT Eval Moderate Complexity: 1 Mod PT Treatments $Gait Training: 8-22 mins        Camdon Saetern W,PT Acute Rehabilitation Services Pager:  [phone]  Office:  [place] 10/03/2019, 12:57 PM

## 2019-10-03 NOTE — Discharge Summary (Signed)
Discharge Summary  Alexis Griffith TDD:[id number] DOB: 01/29/1941  PCP: Haywood Pao, MD  Admit date: 09/30/2019 Discharge date: 10/03/2019  Time spent: 35 minutes  Recommendations for Outpatient Follow-up:  1. Follow-up with cardiology 2. Follow-up with your urologist 3. Follow-up with your primary care provider 4. Take your medications as described 5. Continue PT OT with assistance and fall precautions.  Recommendations per cardiology: Medication Recommendations:  Ranexa 500mg  BID, ASA 81mg  daily, fenofibrate 160mg  daily, Imdur 30mg  daily, Losartan 50mg  daily, Lopressor 50mg  BID and Crestor 20mg  daily.   Other recommendations (labs, testing, etc):  event monitor Follow up as an outpatient:  F/U with [facility]>>appt on 10/16/19  Discharge Diagnoses:  Active Hospital Problems   Diagnosis Date Noted  . Syncope   . Syncope and collapse 10/01/2019  . CHF (congestive heart failure) ([place]) 09/25/2019  . Orthostatic hypotension 09/11/2019  . Urinary retention 09/11/2019  . Chest pain   . 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 Diabetes mellitus type 2 in nonobese (HCC)   . Severe aortic stenosis   . Coronary artery disease involving native coronary artery of native heart with angina pectoris ([place]) 07/04/2019  . Carotid artery disease ([place]) 08/24/2011  . Labile hypertension     Resolved Hospital Problems  No resolved problems to display.    Discharge Condition: Stable  Diet recommendation: Heart healthy carb modified diet  Vitals:   10/03/19 0450 10/03/19 1233  BP: (!) 140/58 (!) 135/59  Pulse: 70 62  Resp: 20 16  Temp: 98.7 F (37.1 C) 98 F (36.7 C)  SpO2: 96% 98%    History of present illness:  Alexis Griffith a 79 y.o.malewith medical history significant ofCAD, stroke, AS s/p AVR, renal mass with concern for renal carcinoma, orthostatic hypotension and recurrent syncope, HTN, HLD, h/o GCA, DM2, HFpEF (TTE 09/10/19 showed  normal EF with a Grade 1 diastolic dysfunction and functional AVR), carotid artery disease, urinary retention with chronic foley and chronic anxiety who presents for chest pain. The patient reports that on Saturday (day of admission) patient developed a tightening up of his chest. It was similar feeling to his previous NSTEMI, but not as bad. About a 5/10. Pain did not radiate. He is currently at [facility] for rehab and he notes that he gets very nervous on the weekends as they have lower staff and he is concerned something will happen to him. He is on 4X per day anxiolytic at this time. He was brought to the ED and after triage was placed in the waiting room. He reports at that time having some issues with anxiety as well given he was surrounded by people and he was worried about how long it would take to be seen. He developed dizziness and then does not remember anything prior to being in his ED room. Per report from EDP, patient briefly lost consciousness and was reported that he lost a pulse, though this was for a brief time. He awoke upon being brought into triage. He has a history of recurrent syncope due to orthostatic hypotension and has at times been taken off many of his blood pressure medications. Further symptoms include discomfort from the foley catheter (has had to cancel follow up with urology 3X to do voiding trials) and some LLQ pain which he is not sure if coming from his bladder or bowels. He denies constipation. He denies heartburn, vomiting, diarrhea, fever, chills, recent illness.   Review of recent hospitalizations:  08/15/19 -->  08/31/19 TAVR on 08/15/19. Hospital course complicated by a peri procedural small infarct of the left medial frontal cortex, recurrent syncope due to orthostasis (only on metoprolol on discharge), acute urinary retention requiring coude catheterization and follow up with Urology, UTI and epididymoorchitis for which he was treated with fosfomycin and  pulmonary nodules found on PET scan.   09/08/19 --> 09/12/19 Presented for Chest pain, found to have NSTEMI. He was deemed not to be interventional at that time due to urinary bleeding requiring cessation of plavix previously, so plan was for medical treatment. Continue ASA, statin titration, anti-anginals including amlodipine and IMDUR. Foley was in place for urinary retention. He is noted to have renal cell carcinoma that is well known and following with Urology. Patient was discharged to [facility] home.   09/19/19 --> 09/28/19 Admission for chest pain again. Troponins did not rise >100. He was managed with home medications. BP was labile with highs and lows. Noted to have issues with orthostasis. Discharged back to [facility].   Elevated troponin S peaked at 25.  Episode of chest pain the night of 10/01/19 and 10/02/19.  Seen by cardiology, ongoing cardiac meds adjustment.  10/03/19: No new complaints.  No acute events overnight. Seen by cardiology, ordered 30-day event monitor for further evaluation of syncope.  Signed off.  Hospital Course:  Active Problems:   Labile hypertension   Carotid artery disease (HCC)   Coronary artery disease involving native coronary artery of native heart with angina pectoris (HCC)   Diabetes mellitus type 2 in nonobese (HCC)   Severe aortic stenosis   S/P TAVR (transcatheter aortic valve replacement)   Chest pain   Orthostatic hypotension   Urinary retention   CHF (congestive heart failure) (HCC)   Syncope and collapse   Syncope   Syncope, likely orthostatic in origin -Initially positive orthostatic vital signs -Cardiology will add thigh-high compression hose and will order event monitor as outpatient to evaluate for arrhythmias. -TAVR stable on 2D echo. -Follow-up with cardiology outpatient.  Essential hypertension -Medications adjusted by cardiology Recommendations as stated above.  Chest pain rule out ACS Coronary artery disease  involving native coronary artery of native heart with angina pectoris - He has had an extensive course of hospitalizations recently, worsened by anxiety.  Ranexa added by cardiology -Cardiac medications as noted above  Chronic anxiety Resume prior to admission regimen Follow up with your PCP  LLQ abdominal pain, resolved Abdominal x-ray with no acute findings.  Carotid artery disease (HCC) - Continue home aspirin and statin -Plavix was held due to hematuria Follow-up with cardiology outpatient  Diabetes mellitus type 2 in nonobese [facility]) with hyperglycemia Hemoglobin A1c 6.4 and 08/17/2019 Resume prior to admission regimen Follow-up with your PCP  Severe aortic stenosis S/P TAVR (transcatheter aortic valve replacement) - At last check AVR was working well -TAVR stable on 2D echo done on 10/02/2019  Urinary retention with indwelling Foley catheter -Follow-up with urology  Chronic HFpEF -Last 2D echo done on 09/10/2019 showed LVEF 60 to 65% with grade 1 diastolic dysfunction Euvolemic on exam Net I&O -5.8 L Continue medications as recommended by cardiology.  Ambulatory dysfunction PT OT recommended SNF Continue PT OT with assistance and fall precautions.    Code Status:Full  Discharge Exam: BP (!) 135/59 (BP Location: Left Arm)   Pulse 62   Temp 98 F (36.7 C)   Resp 16   Ht 5\' 10"  (1.778 m)   Wt 82.2 kg Comment: scale b  SpO2 98%  BMI 26.01 kg/m  . General: 79 y.o. year-old male well developed well nourished in no acute distress.  Alert and oriented x3. . Cardiovascular: Regular rate and rhythm with no rubs or gallops.  No thyromegaly or JVD noted.   Marland Kitchen Respiratory: Clear to auscultation with no wheezes or rales. Good inspiratory effort. . Abdomen: Soft nontender nondistended with normal bowel sounds x4 quadrants. . Musculoskeletal: No lower extremity edema. 2/4 pulses in all 4 extremities. Marland Kitchen Psychiatry: Mood is appropriate for  condition and setting  Discharge Instructions You were cared for by a hospitalist during your hospital stay. If you have any questions about your discharge medications or the care you received while you were in the hospital after you are discharged, you can call the unit and asked to speak with the hospitalist on call if the hospitalist that took care of you is not available. Once you are discharged, your primary care physician will handle any further medical issues. Please note that NO REFILLS for any discharge medications will be authorized once you are discharged, as it is imperative that you return to your primary care physician (or establish a relationship with a primary care physician if you do not have one) for your aftercare needs so that they can reassess your need for medications and monitor your lab values.   Allergies as of 10/03/2019      Reactions   Macrodantin [nitrofurantoin] Other (See Comments)   "blocked kidneys"    Tape Other (See Comments)   "Tears my skin and leaves marks." PLEASE USE COBAN!!   Sulfa Antibiotics Rash   Lisinopril Cough   Nutritional Supplements Other (See Comments)   Protein drink given to me "did something"   Penicillins Other (See Comments)   08/30/19- Patient can't remember, was 35+ years ago. OK with trying cephalexin in hospital      Medication List    STOP taking these medications   amLODipine 5 MG tablet Commonly known as: NORVASC   Trilipix 135 MG capsule Generic drug: Choline Fenofibrate Replaced by: fenofibrate 160 MG tablet     TAKE these medications   acetaminophen 325 MG tablet Commonly known as: TYLENOL Take 650 mg by mouth every 6 (six) hours as needed for mild pain or moderate pain.   aspirin 81 MG chewable tablet Chew 1 tablet (81 mg total) by mouth daily.   carboxymethylcellulose 0.5 % Soln Commonly known as: REFRESH PLUS Place 1 drop into both eyes every 12 (twelve) hours as needed (dry eyes).   clonazePAM 0.5 MG  tablet Commonly known as: KLONOPIN Take 1 tablet (0.5 mg total) by mouth 4 (four) times daily.   Coenzyme Q10 200 MG capsule Take 200 mg by mouth daily.   fenofibrate 160 MG tablet Take 1 tablet (160 mg total) by mouth daily. Start taking on: October 04, 2019 Replaces: Trilipix 135 MG capsule   fosfomycin 3 g Pack Commonly known as: MONUROL Take 3 g by mouth every 3 (three) days. Mix 3 grams into 6 ounces of water and drink once daily every 3 days, for 10 days   isosorbide mononitrate 30 MG 24 hr tablet Commonly known as: IMDUR Take 1 tablet (30 mg total) by mouth daily. What changed:   medication strength  how much to take   losartan 50 MG tablet Commonly known as: COZAAR Take 1 tablet (50 mg total) by mouth daily.   magnesium oxide 400 (241.3 Mg) MG tablet Commonly known as: MAG-OX Take 1 tablet (400 mg total) by  mouth daily.   metoprolol tartrate 50 MG tablet Commonly known as: LOPRESSOR Take 1 tablet (50 mg total) by mouth 2 (two) times daily.   nitroGLYCERIN 0.4 MG SL tablet Commonly known as: NITROSTAT Place 1 tablet (0.4 mg total) under the tongue every 5 (five) minutes as needed for chest pain (up to 3 doses). Do not give if blood pressure is low (less than 161 systolic). What changed:   when to take this  reasons to take this  additional instructions   NovoLOG FlexPen 100 UNIT/ML FlexPen Generic drug: insulin aspart Inject 1-9 Units into the skin See admin instructions. Inject as per sliding scale: If 70-120= 0 units; 121-150= 1 unit; 151-200= 2 units; 201-250= 3 units; 251-300= 5 units; 301-350= 7 units; 351-400= 9 units GREATER THAN 400 CALL MD, subcutaneously before meals for DM What changed: Another medication with the same name was removed. Continue taking this medication, and follow the directions you see here.   glucose blood test strip 1 each by Other route as needed.   ONE TOUCH ULTRA TEST test strip Generic drug: glucose blood 1 each by Other  route daily.   OneTouch Delica Plus [id number] Misc 1 each by Other route daily.   polyethylene glycol 17 g packet Commonly known as: MIRALAX / GLYCOLAX Take 17 g by mouth daily as needed for mild constipation.   ranolazine 500 MG 12 hr tablet Commonly known as: RANEXA Take 1 tablet (500 mg total) by mouth 2 (two) times daily.   rosuvastatin 20 MG tablet Commonly known as: CRESTOR Take 1 tablet (20 mg total) by mouth daily. What changed: when to take this      Allergies  Allergen Reactions  . Macrodantin [Nitrofurantoin] Other (See Comments)    "blocked kidneys"   . Tape Other (See Comments)    "Tears my skin and leaves marks." PLEASE USE COBAN!!  . Sulfa Antibiotics Rash  . Lisinopril Cough  . Nutritional Supplements Other (See Comments)    Protein drink given to me "did something"  . Penicillins Other (See Comments)    08/30/19- Patient can't remember, was 35+ years ago. OK with trying cephalexin in hospital    Follow-up Information    Tisovec, Fransico Him, MD. Call in 1 day(s).   Specialty: Internal Medicine Why: PLease call for a post hospital follow up appointment Contact information: [place] [address] [phone]        Deboraha Sprang, MD .   Specialty: Cardiology Contact information: [address] [place] [address] [phone]        Eileen Stanford, PA-C Follow up.   Specialties: Cardiology, Radiology Why: Follow-up scheduled for 10/16/2019 at 2:30pm. Please arrive 15 minutes early for check-in. Contact information: [address] [place] [address] [phone]                The results of significant diagnostics from this hospitalization (including imaging, microbiology, ancillary and laboratory) are listed below for reference.    Significant Diagnostic Studies: DG Chest 2 View  Result Date: 10/01/2019 CLINICAL DATA:  Chest pain EXAM: CHEST - 2 VIEW COMPARISON:  September 19, 2019 FINDINGS: There is mild cardiomegaly. Aortic valve stent is noted. Aortic knob calcifications are seen. Both lungs are clear. No pleural effusion. No acute osseous abnormality is seen. IMPRESSION: No active cardiopulmonary disease. Electronically Signed   By: Prudencio Pair M.D.   On: 10/01/2019 00:49   DG Chest 2 View  Result Date: 09/08/2019 CLINICAL DATA:  Chest pain and shortness of breath. EXAM: CHEST - 2 VIEW COMPARISON:  August 11, 2019 FINDINGS: There is no evidence of acute infiltrate, pleural effusion or pneumothorax. A stable subcentimeter calcified nodular opacity is seen overlying the upper left lung. The heart size and mediastinal contours are within normal limits. An artificial aortic valve is seen. This represents a new finding when compared to the prior study. The visualized skeletal structures are unremarkable. IMPRESSION: No active cardiopulmonary disease. Electronically Signed   By: Virgina Norfolk M.D.   On: 09/08/2019 22:16   DG Chest Port 1 View  Result Date: 09/19/2019 CLINICAL DATA:  79 year old male with chest pain. EXAM: PORTABLE CHEST 1 VIEW COMPARISON:  Chest radiograph dated 09/08/2019. FINDINGS: There are bibasilar linear atelectasis/scarring. No focal consolidation, pleural effusion, or pneumothorax. Probable background of mild emphysema. There is mild cardiomegaly. Coronary vascular calcification and aortic valve repair. Atherosclerotic calcification of the aortic arch. No acute osseous pathology. IMPRESSION: 1. No acute cardiopulmonary process. 2. Mild cardiomegaly. Electronically Signed   By: Anner Crete M.D.   On: 09/19/2019 15:16   DG Abd 2 Views  Result Date: 10/01/2019 CLINICAL DATA:  Abdominal pain EXAM: ABDOMEN - 2 VIEW COMPARISON:  CT, 08/24/2019. FINDINGS: Normal bowel gas pattern. Scattered aortoiliac atherosclerotic calcifications. No evidence of renal or ureteral stones. Soft tissues otherwise unremarkable. No acute skeletal abnormality. IMPRESSION: 1.  No acute findings.  Normal bowel gas pattern. Electronically Signed   By: Lajean Manes M.D.   On: 10/01/2019 05:14   ECHOCARDIOGRAM COMPLETE  Result Date: 10/02/2019    ECHOCARDIOGRAM REPORT   Patient Name:   BRUNO LEACH Date of Exam: 10/02/2019 Medical Rec #:  [id number]      Height:       70.0 in Accession #:    [id number]     Weight:       181.9 lb Date of Birth:  May 29, 1940      BSA:          2.005 m Patient Age:    47 years       BP:           131/57 mmHg Patient Gender: M              HR:           65 bpm. Exam Location:  Inpatient Procedure: 2D Echo, 3D Echo and Strain Analysis                                MODIFIED REPORT:    This report was modified by Cherlynn Kaiser MD on 10/02/2019 due to report                                    revision.  Indications:     Chest Pain 786.50 / R07.9                  Syncope 780.2 / R55  History:         Patient has prior history of Echocardiogram examinations, most                  recent 09/12/2019. CHF, CAD and Previous Myocardial Infarction,                  Carotid Disease, Arrythmias:PVC, Signs/Symptoms:Chest Pain and  Hypotension; Risk Factors:Sleep Apnea.                  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 Procedure Date:                  08/15/2019.  Sonographer:     Darlina Sicilian RDCS Referring Phys:  [id number] Darreld Mclean Diagnosing Phys: Cherlynn Kaiser MD IMPRESSIONS  1. Left ventricular ejection fraction, by estimation, is 60 to 65%. The left ventricle has normal function. The left ventricle has no regional wall motion abnormalities. There is moderate asymmetric left ventricular hypertrophy of the septal segment. Left ventricular diastolic parameters are consistent with Grade I diastolic dysfunction (impaired relaxation).  2. Right ventricular systolic function is normal. The right ventricular size is normal.  3. The mitral valve is normal in structure.  Trivial mitral valve regurgitation. No evidence of mitral stenosis.  4. The aortic valve has been repaired/replaced. Aortic valve regurgitation No paravalvular or valvular regurgitation. 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 Procedure Date: 08/15/2019. Echo findings are consistent with normal structure and function of the aortic valve prosthesis. Aortic valve mean gradient measures 13.0 mmHg.  5. The inferior vena cava is normal in size with greater than 50% respiratory variability, suggesting right atrial pressure of 3 mmHg. Comparison(s): Stable prosthetic aortic valve systolic gradient compared to 09/12/19. FINDINGS  Left Ventricle: Left ventricular ejection fraction, by estimation, is 60 to 65%. The left ventricle has normal function. The left ventricle has no regional wall motion abnormalities. The left ventricular internal cavity size was normal in size. There is  moderate asymmetric left ventricular hypertrophy of the septal segment. Left ventricular diastolic parameters are consistent with Grade I diastolic dysfunction (impaired relaxation). Right Ventricle: The right ventricular size is normal. No increase in right ventricular wall thickness. Right ventricular systolic function is normal. Left Atrium: Left atrial size was normal in size. Right Atrium: Right atrial size was normal in size. Pericardium: There is no evidence of pericardial effusion. Mitral Valve: The mitral valve is normal in structure. Normal mobility of the mitral valve leaflets. Trivial mitral valve regurgitation. No evidence of mitral valve stenosis. Tricuspid Valve: The tricuspid valve is normal in structure. Tricuspid valve regurgitation is trivial. No evidence of tricuspid stenosis. Aortic Valve: The aortic valve has been repaired/replaced. Aortic valve regurgitation No paravalvular or valvular regurgitation. Aortic valve mean gradient measures 13.0 mmHg. Aortic valve peak gradient  measures 23.3 mmHg. Aortic valve area, by VTI measures 1.79 cm. 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 Procedure Date: 08/15/2019. Echo findings are consistent with normal structure and function of the aortic valve prosthesis. Pulmonic Valve: The pulmonic valve was normal in structure. Pulmonic valve regurgitation is trivial. No evidence of pulmonic stenosis. Aorta: The aortic root is normal in size and structure. Venous: The inferior vena cava is normal in size with greater than 50% respiratory variability, suggesting right atrial pressure of 3 mmHg. IAS/Shunts: No atrial level shunt detected by color flow Doppler.  LEFT VENTRICLE PLAX 2D LVIDd:         5.10 cm      Diastology LVIDs:         3.20 cm      LV e' lateral:  7.13 cm/s LV PW:         0.90 cm      LV E/e' lateral: 10.9 LV IVS:        1.40 cm      LV e' medial:    4.80 cm/s LVOT diam:     2.50 cm      LV E/e' medial:  16.2 LV SV:         87 LV SV Index:   43 LVOT Area:     4.91 cm  LV Volumes (MOD) LV vol d, MOD A2C: 134.0 ml LV vol d, MOD A4C: 114.0 ml LV vol s, MOD A2C: 54.1 ml LV vol s, MOD A4C: 59.2 ml LV SV MOD A2C:     79.9 ml LV SV MOD A4C:     114.0 ml LV SV MOD BP:      69.3 ml RIGHT VENTRICLE RV S prime:     16.80 cm/s TAPSE (M-mode): 2.5 cm LEFT ATRIUM             Index       RIGHT ATRIUM           Index LA diam:        3.80 cm 1.90 cm/m  RA Area:     12.20 cm LA Vol (A2C):   43.8 ml 21.85 ml/m RA Volume:   23.40 ml  11.67 ml/m LA Vol (A4C):   41.1 ml 20.50 ml/m LA Biplane Vol: 45.1 ml 22.50 ml/m  AORTIC VALVE AV Area (Vmax):    1.75 cm AV Area (Vmean):   1.74 cm AV Area (VTI):     1.79 cm AV Vmax:           241.50 cm/s AV Vmean:          167.750 cm/s AV VTI:            0.484 m AV Peak Grad:      23.3 mmHg AV Mean Grad:      13.0 mmHg LVOT Vmax:         86.20 cm/s LVOT Vmean:        59.300 cm/s LVOT VTI:          0.177 m LVOT/AV VTI ratio: 0.37  AORTA Ao Asc diam: 3.40 cm  MITRAL VALVE MV Area (PHT): 2.29 cm    SHUNTS MV Decel Time: 331 msec    Systemic VTI:  0.18 m MV E velocity: 77.60 cm/s  Systemic Diam: 2.50 cm MV A velocity: 90.80 cm/s MV E/A ratio:  0.85 Cherlynn Kaiser MD Electronically signed by Cherlynn Kaiser MD Signature Date/Time: 10/02/2019/7:46:50 PM    Final (Updated)    ECHOCARDIOGRAM LIMITED  Result Date: 09/12/2019    ECHOCARDIOGRAM LIMITED REPORT   Patient Name:   GAELEN BRAGER Date of Exam: 09/10/2019 Medical Rec #:  [id number]      Height:       70.0 in Accession #:    [id number]     Weight:       188.5 lb Date of Birth:  March 11, 1940      BSA:          2.035 m Patient Age:    34 years       BP:           161/72 mmHg Patient Gender: M              HR:           74 bpm.  Exam Location:  Inpatient Procedure: Limited Echo, Limited Color Doppler and Cardiac Doppler Indications:    ACS/S/P TAVR 1 month  History:        Patient has prior history of Echocardiogram examinations, most                 recent 08/16/2019.                 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 Procedure Date:                 08/15/2019.  Sonographer:    Clayton Lefort RDCS (AE) Referring Phys: [id number] CARRIEL T NIPP IMPRESSIONS  1. Stable findings 1 month post TAVR, normal function of the bioprosthetic valve, mean transaortic gradient 12 mmHg, no paravalvular leak.  2. Left ventricular ejection fraction, by estimation, is 60 to 65%. The left ventricle has normal function. The left ventricle has no regional wall motion abnormalities. There is moderate concentric left ventricular hypertrophy. Left ventricular diastolic parameters are consistent with Grade I diastolic dysfunction (impaired relaxation). Elevated left atrial pressure.  3. Right ventricular systolic function is normal. The right ventricular size is normal.  4. The mitral valve is normal in structure. Trivial mitral valve regurgitation. No evidence of mitral  stenosis.  5. The aortic valve has been repaired/replaced. Aortic valve regurgitation is not visualized. No aortic stenosis is present. 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 Procedure Date: 08/15/2019. Echo findings  are consistent with normal structure and function of the aortic valve prosthesis.  6. The inferior vena cava is normal in size with greater than 50% respiratory variability, suggesting right atrial pressure of 3 mmHg. FINDINGS  Left Ventricle: Left ventricular ejection fraction, by estimation, is 60 to 65%. The left ventricle has normal function. The left ventricle has no regional wall motion abnormalities. The left ventricular internal cavity size was normal in size. 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 Elevated left atrial pressure. Right Ventricle: The right ventricular size is normal. No increase in right ventricular wall thickness. Right ventricular systolic function is normal. Left Atrium: Left atrial size was normal in size. Right Atrium: Right atrial size was normal in size. Pericardium: There is no evidence of pericardial effusion. Mitral Valve: The mitral valve is normal in structure. Normal mobility of the mitral valve leaflets. Trivial mitral valve regurgitation. No evidence of mitral valve stenosis. Tricuspid Valve: The tricuspid valve is normal in structure. Tricuspid valve regurgitation is mild . No evidence of tricuspid stenosis. Aortic Valve: The aortic valve has been repaired/replaced. Aortic valve regurgitation is not visualized. No aortic stenosis is present. Aortic valve mean gradient measures 12.0 mmHg. Aortic valve peak gradient measures 23.4 mmHg. Aortic valve area, by VTI measures 1.96 cm. 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 Procedure Date: 08/15/2019. Echo findings are consistent with normal structure and function of the aortic valve prosthesis.  Pulmonic Valve: The pulmonic valve was normal in structure. Pulmonic valve regurgitation is not visualized. No evidence of pulmonic stenosis. Aorta: The aortic root is normal in size and structure. Venous: The inferior vena cava is normal in size with greater than 50% respiratory variability, suggesting right atrial pressure of 3 mmHg. IAS/Shunts: No atrial level shunt detected by color flow  Doppler.  LEFT VENTRICLE PLAX 2D LVIDd:         3.80 cm  Diastology LVIDs:         3.40 cm  LV e' lateral:   6.90 cm/s LV PW:         1.70 cm  LV E/e' lateral: 8.9 LV IVS:        1.80 cm  LV e' medial:    4.20 cm/s LVOT diam:     2.60 cm  LV E/e' medial:  14.7 LV SV:         87 LV SV Index:   43 LVOT Area:     5.31 cm  IVC IVC diam: 1.60 cm LEFT ATRIUM         Index LA diam:    3.20 cm 1.57 cm/m  AORTIC VALVE AV Area (Vmax):    2.03 cm AV Area (Vmean):   2.11 cm AV Area (VTI):     1.96 cm AV Vmax:           242.00 cm/s AV Vmean:          159.000 cm/s AV VTI:            0.442 m AV Peak Grad:      23.4 mmHg AV Mean Grad:      12.0 mmHg LVOT Vmax:         92.70 cm/s LVOT Vmean:        63.100 cm/s LVOT VTI:          0.163 m LVOT/AV VTI ratio: 0.37  AORTA Ao Root diam: 3.40 cm Ao Asc diam:  3.10 cm MITRAL VALVE MV Area (PHT): 2.37 cm    SHUNTS MV Decel Time: 320 msec    Systemic VTI:  0.16 m MV E velocity: 61.70 cm/s  Systemic Diam: 2.60 cm MV A velocity: 88.30 cm/s MV E/A ratio:  0.70 Ena Dawley MD Electronically signed by Ena Dawley MD Signature Date/Time: 09/12/2019/10:42:31 AM    Final     Microbiology: Recent Results (from the past 240 hour(s))  SARS Coronavirus 2 by RT PCR (hospital order, performed in [place] hospital lab) Nasopharyngeal Nasopharyngeal Swab     Status: None   Collection Time: 09/23/19  6:45 PM   Specimen: Nasopharyngeal Swab  Result Value Ref Range Status   SARS Coronavirus 2 NEGATIVE NEGATIVE Final    Comment: (NOTE) SARS-CoV-2 target nucleic acids are NOT DETECTED.  The  SARS-CoV-2 RNA is generally detectable in upper and lower respiratory specimens during the acute phase of infection. The lowest concentration of SARS-CoV-2 viral copies this assay can detect is 250 copies / mL. A negative result does not preclude SARS-CoV-2 infection and should not be used as the sole basis for treatment or other patient management decisions.  A negative result may occur with improper specimen collection / handling, submission of specimen other than nasopharyngeal swab, presence of viral mutation(s) within the areas targeted by this assay, and inadequate number of viral copies (<250 copies / mL). A negative result must be combined with clinical observations, patient history, and epidemiological information.  Fact Sheet for Patients:   StrictlyIdeas.no  Fact Sheet for Healthcare Providers: BankingDealers.co.za  This test is not yet approved or  cleared by the [place] FDA and has been authorized for detection and/or diagnosis of SARS-CoV-2 by FDA under an Emergency Use Authorization (EUA).  This EUA will remain in effect (meaning this test can be used) for the duration of the COVID-19 declaration  under Section 564(b)(1) of the Act, 21 U.S.C. section 360bbb-3(b)(1), unless the authorization is terminated or revoked sooner.  Performed at [facility], [place] [address], [place], [place] [address]   SARS CORONAVIRUS 2 (TAT 6-24 HRS) Nasopharyngeal Nasopharyngeal Swab     Status: None   Collection Time: 09/25/19  5:36 PM   Specimen: Nasopharyngeal Swab  Result Value Ref Range Status   SARS Coronavirus 2 NEGATIVE NEGATIVE Final    Comment: (NOTE) SARS-CoV-2 target nucleic acids are NOT DETECTED.  The SARS-CoV-2 RNA is generally detectable in upper and lower respiratory specimens during the acute phase of infection. Negative results do not preclude SARS-CoV-2 infection, do not rule out co-infections with other  pathogens, and should not be used as the sole basis for treatment or other patient management decisions. Negative results must be combined with clinical observations, patient history, and epidemiological information. The expected result is Negative.  Fact Sheet for Patients: SugarRoll.be  Fact Sheet for Healthcare Providers: https://www.woods-mathews.com/  This test is not yet approved or cleared by the [place] FDA and  has been authorized for detection and/or diagnosis of SARS-CoV-2 by FDA under an Emergency Use Authorization (EUA). This EUA will remain  in effect (meaning this test can be used) for the duration of the COVID-19 declaration under Se ction 564(b)(1) of the Act, 21 U.S.C. section 360bbb-3(b)(1), unless the authorization is terminated or revoked sooner.  Performed at [facility], [place] [address] [place], [place] [address]   SARS Coronavirus 2 by RT PCR (hospital order, performed in [facility] hospital lab) Nasopharyngeal Nasopharyngeal Swab     Status: None   Collection Time: 10/01/19  2:32 AM   Specimen: Nasopharyngeal Swab  Result Value Ref Range Status   SARS Coronavirus 2 NEGATIVE NEGATIVE Final    Comment: (NOTE) SARS-CoV-2 target nucleic acids are NOT DETECTED.  The SARS-CoV-2 RNA is generally detectable in upper and lower respiratory specimens during the acute phase of infection. The lowest concentration of SARS-CoV-2 viral copies this assay can detect is 250 copies / mL. A negative result does not preclude SARS-CoV-2 infection and should not be used as the sole basis for treatment or other patient management decisions.  A negative result may occur with improper specimen collection / handling, submission of specimen other than nasopharyngeal swab, presence of viral mutation(s) within the areas targeted by this assay, and inadequate number of viral copies (<250 copies / mL). A negative result must be  combined with clinical observations, patient history, and epidemiological information.  Fact Sheet for Patients:   StrictlyIdeas.no  Fact Sheet for Healthcare Providers: BankingDealers.co.za  This test is not yet approved or  cleared by the [place] FDA and has been authorized for detection and/or diagnosis of SARS-CoV-2 by FDA under an Emergency Use Authorization (EUA).  This EUA will remain in effect (meaning this test can be used) for the duration of the COVID-19 declaration under Section 564(b)(1) of the Act, 21 U.S.C. section 360bbb-3(b)(1), unless the authorization is terminated or revoked sooner.  Performed at [facility], [place] [address], [place], [place] [address]      Labs: Basic Metabolic Panel: Recent Labs  Lab 10/01/19 0015 10/01/19 0429 10/02/19 0525  NA 135 135 136  K 3.9 4.1 3.8  CL 103 103 103  CO2 22 21* 25  GLUCOSE 218* 232* 194*  BUN 14 16 12   CREATININE 0.99 0.90 0.86  CALCIUM 9.8 9.7 9.3  MG  --   --  1.9  PHOS  --   --  2.9   Liver Function Tests: No results for input(s): AST, ALT, ALKPHOS, BILITOT, PROT, ALBUMIN in the last 168 hours. No results for input(s): LIPASE, AMYLASE in the last 168 hours. No results for input(s): AMMONIA in the last 168 hours. CBC: Recent Labs  Lab 10/01/19 0015 10/01/19 0429 10/02/19 0525  WBC 8.0 8.8 7.5  HGB 13.6 13.6 13.7  HCT 41.9 42.0 42.6  MCV 84.0 84.0 84.9  PLT 166 140* 130*   Cardiac Enzymes: No results for input(s): CKTOTAL, CKMB, CKMBINDEX, TROPONINI in the last 168 hours. BNP: BNP (last 3 results) Recent Labs    08/11/19 0844 09/08/19 2139 09/09/19 0308  BNP 238.5* 222.2* 326.2*    ProBNP (last 3 results) No results for input(s): PROBNP in the last 8760 hours.  CBG: Recent Labs  Lab 10/02/19 1137 10/02/19 1626 10/02/19 2119 10/03/19 0606 10/03/19 1112  GLUCAP 258* 175* 179* 172*  172* 245*       Signed:  Kayleen Memos, MD [facility] 10/03/2019, 3:03 PM

## 2019-10-03 NOTE — Progress Notes (Signed)
Ordered 30-day Event Monitor for further evaluation of syncope. Please see progress note from today for more information.  Darreld Mclean, PA-C 10/03/2019 9:44 AM

## 2019-10-03 NOTE — Progress Notes (Signed)
Progress Note  Patient Name: Alexis Griffith Date of Encounter: 10/03/2019  [facility] Cardiologist: Mertie Moores, MD   Subjective   No acute overnight events. No recurrent chest pain. No shortness of breath or palpitations.  Inpatient Medications    Scheduled Meds: . amLODipine  5 mg Oral Daily  . aspirin  81 mg Oral Daily  . Chlorhexidine Gluconate Cloth  6 each Topical Daily  . clonazePAM  0.5 mg Oral QID  . enoxaparin (LOVENOX) injection  40 mg Subcutaneous Daily  . fenofibrate  160 mg Oral Daily  . insulin aspart  0-15 Units Subcutaneous TID WC  . insulin aspart  0-5 Units Subcutaneous QHS  . isosorbide mononitrate  30 mg Oral Daily  . losartan  50 mg Oral Daily  . magnesium oxide  400 mg Oral Daily  . metoprolol tartrate  50 mg Oral BID  . ranolazine  500 mg Oral BID  . rosuvastatin  20 mg Oral QHS  . sodium chloride flush  3 mL Intravenous Q12H   Continuous Infusions:  PRN Meds: acetaminophen, nitroGLYCERIN, ondansetron (ZOFRAN) IV, polyethylene glycol, polyvinyl alcohol   Vital Signs    Vitals:   10/02/19 1035 10/02/19 1213 10/02/19 2010 10/03/19 0450  BP: (!) 151/56 (!) 131/57 (!) 148/58 (!) 140/58  Pulse: 60 65 71 70  Resp: 14 16 18 20   Temp: 98.7 F (37.1 C) (!) 97.4 F (36.3 C) 98 F (36.7 C) 98.7 F (37.1 C)  TempSrc: Oral Oral Oral Oral  SpO2: 93% 95% 98% 96%  Weight:    82.2 kg  Height:       Orthostatic VS for the past 24 hrs:  BP- Lying Pulse- Lying BP- Sitting Pulse- Sitting BP- Standing at 0 minutes Pulse- Standing at 0 minutes  10/02/19 0928 166/55 68 146/66 75 141/56 81    Intake/Output Summary (Last 24 hours) at 10/03/2019 0726 Last data filed at 10/03/2019 0000 Gross per 24 hour  Intake 1080 ml  Output 3400 ml  Net -2320 ml   Last 3 Weights 10/03/2019 10/02/2019 10/01/2019  Weight (lbs) 181 lb 4.8 oz 181 lb 14.4 oz 200 lb 9.9 oz  Weight (kg) 82.237 kg 82.509 kg 91 kg      Telemetry    Sinus rhythm with rates mostly in the 50's  to 80's. Occasionally in the 90's to low 100's (suspect this is with ambulation). - Personally Reviewed  ECG    No new ECG tracing today. - Personally Reviewed  Physical Exam   GEN: No acute distress.   Neck: Supple. No JVD Cardiac: RRR. Soft II/VI systolic murmur. No rubs or gallops.  Respiratory: Clear to auscultation bilaterally. No wheezes, rhonchi, or rales. GI: Soft, non-tender, non-distended  MS: No lower extremity edema. No deformity. Skin: Warm and dry. Neuro:  Non-focal. Psych: Normal affect. Responds appropriately.   Labs    High Sensitivity Troponin:   Recent Labs  Lab 10/01/19 0015 10/01/19 0215 10/01/19 0429 10/01/19 0707 10/01/19 2140  TROPONINIHS 26* 27* 23* 23* 25*      Chemistry Recent Labs  Lab 10/01/19 0015 10/01/19 0429 10/02/19 0525  NA 135 135 136  K 3.9 4.1 3.8  CL 103 103 103  CO2 22 21* 25  GLUCOSE 218* 232* 194*  BUN 14 16 12   CREATININE 0.99 0.90 0.86  CALCIUM 9.8 9.7 9.3  GFRNONAA >60 >60 >60  GFRAA >60 >60 >60  ANIONGAP 10 11 8      Hematology Recent Labs  Lab  10/01/19 0015 10/01/19 0429 10/02/19 0525  WBC 8.0 8.8 7.5  RBC 4.99 5.00 5.02  HGB 13.6 13.6 13.7  HCT 41.9 42.0 42.6  MCV 84.0 84.0 84.9  MCH 27.3 27.2 27.3  MCHC 32.5 32.4 32.2  RDW 14.6 14.6 14.7  PLT 166 140* 130*    BNPNo results for input(s): BNP, PROBNP in the last 168 hours.   DDimer No results for input(s): DDIMER in the last 168 hours.   Radiology    ECHOCARDIOGRAM COMPLETE  Result Date: 10/02/2019    ECHOCARDIOGRAM REPORT   Patient Name:   Alexis Griffith Date of Exam: 10/02/2019 Medical Rec #:  [id number]      Height:       70.0 in Accession #:    [id number]     Weight:       181.9 lb Date of Birth:  Dec 24, 1940      BSA:          2.005 m Patient Age:    79 years       BP:           131/57 mmHg Patient Gender: M              HR:           65 bpm. Exam Location:  Inpatient Procedure: 2D Echo, 3D Echo and Strain Analysis                                 MODIFIED REPORT:    This report was modified by Cherlynn Kaiser MD on 10/02/2019 due to report                                    revision.  Indications:     Chest Pain 786.50 / R07.9                  Syncope 780.2 / R55  History:         Patient has prior history of Echocardiogram examinations, most                  recent 09/12/2019. CHF, CAD and Previous Myocardial Infarction,                  Carotid Disease, Arrythmias:PVC, Signs/Symptoms:Chest Pain and                  Hypotension; Risk Factors:Sleep Apnea.                  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 Procedure Date:                  08/15/2019.  Sonographer:     Darlina Sicilian RDCS Referring Phys:  [id number] Darreld Mclean Diagnosing Phys: Cherlynn Kaiser MD IMPRESSIONS  1. Left ventricular ejection fraction, by estimation, is 60 to 65%. The left ventricle has normal function. The left ventricle has no regional wall motion abnormalities. There is moderate asymmetric left ventricular hypertrophy of the septal segment. Left ventricular diastolic parameters are consistent with Grade I diastolic dysfunction (impaired relaxation).  2. Right ventricular systolic function is normal. The right ventricular size is normal.  3. The  mitral valve is normal in structure. Trivial mitral valve regurgitation. No evidence of mitral stenosis.  4. The aortic valve has been repaired/replaced. Aortic valve regurgitation No paravalvular or valvular regurgitation. 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 Procedure Date: 08/15/2019. Echo findings are consistent with normal structure and function of the aortic valve prosthesis. Aortic valve mean gradient measures 13.0 mmHg.  5. The inferior vena cava is normal in size with greater than 50% respiratory variability, suggesting right atrial pressure of 3 mmHg. Comparison(s): Stable prosthetic aortic valve  systolic gradient compared to 09/12/19. FINDINGS  Left Ventricle: Left ventricular ejection fraction, by estimation, is 60 to 65%. The left ventricle has normal function. The left ventricle has no regional wall motion abnormalities. The left ventricular internal cavity size was normal in size. There is  moderate asymmetric left ventricular hypertrophy of the septal segment. Left ventricular diastolic parameters are consistent with Grade I diastolic dysfunction (impaired relaxation). Right Ventricle: The right ventricular size is normal. No increase in right ventricular wall thickness. Right ventricular systolic function is normal. Left Atrium: Left atrial size was normal in size. Right Atrium: Right atrial size was normal in size. Pericardium: There is no evidence of pericardial effusion. Mitral Valve: The mitral valve is normal in structure. Normal mobility of the mitral valve leaflets. Trivial mitral valve regurgitation. No evidence of mitral valve stenosis. Tricuspid Valve: The tricuspid valve is normal in structure. Tricuspid valve regurgitation is trivial. No evidence of tricuspid stenosis. Aortic Valve: The aortic valve has been repaired/replaced. Aortic valve regurgitation No paravalvular or valvular regurgitation. Aortic valve mean gradient measures 13.0 mmHg. Aortic valve peak gradient measures 23.3 mmHg. Aortic valve area, by VTI measures 1.79 cm. 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 Procedure Date: 08/15/2019. Echo findings are consistent with normal structure and function of the aortic valve prosthesis. Pulmonic Valve: The pulmonic valve was normal in structure. Pulmonic valve regurgitation is trivial. No evidence of pulmonic stenosis. Aorta: The aortic root is normal in size and structure. Venous: The inferior vena cava is normal in size with greater than 50% respiratory variability, suggesting right atrial pressure of 3 mmHg. IAS/Shunts: No  atrial level shunt detected by color flow Doppler.  LEFT VENTRICLE PLAX 2D LVIDd:         5.10 cm      Diastology LVIDs:         3.20 cm      LV e' lateral:   7.13 cm/s LV PW:         0.90 cm      LV E/e' lateral: 10.9 LV IVS:        1.40 cm      LV e' medial:    4.80 cm/s LVOT diam:     2.50 cm      LV E/e' medial:  16.2 LV SV:         87 LV SV Index:   43 LVOT Area:     4.91 cm  LV Volumes (MOD) LV vol d, MOD A2C: 134.0 ml LV vol d, MOD A4C: 114.0 ml LV vol s, MOD A2C: 54.1 ml LV vol s, MOD A4C: 59.2 ml LV SV MOD A2C:     79.9 ml LV SV MOD A4C:     114.0 ml LV SV MOD BP:      69.3 ml RIGHT VENTRICLE RV S prime:     16.80 cm/s  TAPSE (M-mode): 2.5 cm LEFT ATRIUM             Index       RIGHT ATRIUM           Index LA diam:        3.80 cm 1.90 cm/m  RA Area:     12.20 cm LA Vol (A2C):   43.8 ml 21.85 ml/m RA Volume:   23.40 ml  11.67 ml/m LA Vol (A4C):   41.1 ml 20.50 ml/m LA Biplane Vol: 45.1 ml 22.50 ml/m  AORTIC VALVE AV Area (Vmax):    1.75 cm AV Area (Vmean):   1.74 cm AV Area (VTI):     1.79 cm AV Vmax:           241.50 cm/s AV Vmean:          167.750 cm/s AV VTI:            0.484 m AV Peak Grad:      23.3 mmHg AV Mean Grad:      13.0 mmHg LVOT Vmax:         86.20 cm/s LVOT Vmean:        59.300 cm/s LVOT VTI:          0.177 m LVOT/AV VTI ratio: 0.37  AORTA Ao Asc diam: 3.40 cm MITRAL VALVE MV Area (PHT): 2.29 cm    SHUNTS MV Decel Time: 331 msec    Systemic VTI:  0.18 m MV E velocity: 77.60 cm/s  Systemic Diam: 2.50 cm MV A velocity: 90.80 cm/s MV E/A ratio:  0.85 Cherlynn Kaiser MD Electronically signed by Cherlynn Kaiser MD Signature Date/Time: 10/02/2019/7:46:50 PM    Final (Updated)     Cardiac Studies   Echocardiogram 09/10/2019: Impressions: 1. Stable findings 1 month post TAVR, normal function of the  bioprosthetic valve, mean transaortic gradient 12 mmHg, no paravalvular  leak.  2. Left ventricular ejection fraction, by estimation, is 60 to 65%. The  left ventricle has normal  function. The left ventricle has no regional  wall motion abnormalities. There is moderate concentric left ventricular  hypertrophy. Left ventricular  diastolic parameters are consistent with Grade I diastolic dysfunction  (impaired relaxation). Elevated left atrial pressure.  3. Right ventricular systolic function is normal. The right ventricular  size is normal.  4. The mitral valve is normal in structure. Trivial mitral valve  regurgitation. No evidence of mitral stenosis.  5. The aortic valve has been repaired/replaced. Aortic valve  regurgitation is not visualized. No aortic stenosis is present. There is a  26 mm Edwards Sapien prosthetic (TAVR) valve present in the aortic  position. Procedure Date: 08/15/2019. Echo findings  are consistent with normal structure and function of the aortic valve  prosthesis.  6. The inferior vena cava is normal in size with greater than 50%  respiratory variability, suggesting right atrial pressure of 3 mmHg.   Patient Profile     79 y.o. 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 Recently discharged on 09/28/2019 after being admitted for chest pain. Readmitted on 09/30/2019 for recurrent chest pain and syncope.  Assessment & Plan    Chest Pain with Known Multivessel CAD -  Patient readmitted with recurrent chest pain and syncope. Occurred in the setting of feeling very anxious/stressed. - Recent admission (from 7/9 to 7/13) with NSTEMI and high-sensitivity troponin peaking at 3,124. Decision made to treat medically given concern for DAPT with recent gross hematuria  requiring cessation of Plavix after TAVR. High-sensitivity troponin this admission peaked at 27. Not consistent with ACS. - EKG shows no acute ischemic changes. He does have some mild ST elevations in anterior leads that have been seen on prior tracings - looks more like early repolarization. - No more angina. - Ranexa 500mg  twice daily was started yesterday. - Continue medical therapy: Aspirin, Lopressor, Imdur, Amlodipine, Ranexa, and Crestor. Plavix had to be stopped during recent admission due to recent hematuria with renal mass. Of note, Imdur was decreased to 30mg  on admission due to concern for orthostatic hypotension.   Syncope - Felt to likely be vasovagal in nature as patient feeling very stressed and dizziness prior to episode.  - Orthostatic vitals signs showed BP of 166/55 (HR 68) with lying and BP of 141/56 (HR 81) with standing. However, this is not felt to be cause of patient's syncope.  - Echo showed LVEF of 60-65% with moderate asymmetric LVH of the septal segment and grade 1 diastolic dysfunction. Stable TAVR prosthetic valve. - No concerning arrhythmias noted on telemetry.  - Will order outpatient 30-Day Event Monitor.  Severe Aortic Stenosis s/p TAVR on 08/15/2019 - Echo this admission showed stable prosthetic aortic valve systolic gradient compared to 09/12/2019. - On Aspirin alone due to recent hematuria. - Patient has outpatient follow-up with Nell Range, Structural Heart PA, on 10/16/2019.  Hypertension with History of Orthostatic Hypotension - BP looks more stable today but still elevated at time. - Current medications include: Amlodipine 5mg  daily, Imdur 30mg  daily, Losartan 50mg  daily, and Lopressor 50mg  twice daily.  - Given concern for possible orthostasis, would increase Losartan rather than Amlodipine if additional BP control is needed. - Discussed changing positions slowly with orthostatic hypotension.  Renal Cell Carcinoma - Complicated by recent urinary  retention requiring Foley, hematuria, and UTI/epididymoorchitis s/p Fosfomycin. Now has chronic indwelling Foley and is followed by Dr. Tresa Moore. Now also has pulmonary nodules concerning for metastasis.  - Continue outpatient Urology-Oncology follow-up.  Carotid Artery Disease - Moderate on CTA head/neck. - Continue routine outpatient monitoring.   Hyperlipidemia - LDL was 76 on 09/11/2019. - LDL goal <70 given CAD. - Continue Crestor 20mg  daily. This dose was recently increased so will need repeat lipid panel and LFTs in about 6-8 weeks.  Type 2 Diabetes Mellitus - Being maintained on sliding scale insulin during admission. - Can restart home medications at discharge.  Anxiety  - Suspect anxiety is playing a large role in above symptoms.  - Continue Clonazepam 0.5mg  four times daily.  - Further management per primary team.  For questions or updates, please contact [place] Please consult [email or web address] for contact info under        Signed, Darreld Mclean, PA-C  10/03/2019, 7:26 AM

## 2019-10-03 NOTE — Evaluation (Signed)
Occupational Therapy Evaluation Patient Details Name: Alexis Griffith MRN: [id number] DOB: [birth year] Today's Date: 10/03/2019    History of Present Illness Alexis Griffith is a 79 y.o. 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  Pt was d/c to SNF after recent hospitalizatin and was still there. He presented to ED with chest pain. Pt admitted with chest pain with known multivessel CAD.   Clinical Impression   PT admitted with chest pain. Pt currently with functional limitiations due to the deficits listed below (see OT problem list). Pt this session with orthostatic BP and no awareness to the changes. Pt positioned in chair at end of session and RN notified of BP changes. (see below) Pt will benefit from skilled OT to increase their independence and safety with adls and balance to allow discharge SNF ( requesting [facility]). Orthostatic BPs  Supine 169/64  Sitting 165/66  Standing 138/61  Standing at sink for oral care walks back to chair and standing taken again   Standing after (movement)  127/61       Follow Up Recommendations  SNF;Supervision/Assistance - 24 hour    Equipment Recommendations  Other (comment) (RW)    Recommendations for Other Services       Precautions / Restrictions Precautions Precautions: Fall Precaution Comments: watch BP Restrictions Weight Bearing Restrictions: No      Mobility Bed Mobility Overal bed mobility: Needs Assistance Bed Mobility: Supine to Sit;Rolling Rolling: Supervision   Supine to sit: Modified independent (Device/Increase time)     General bed mobility comments: requires use of bed rail and increased time to complete task exiting on L not right this session  Transfers Overall  transfer level: Needs assistance Equipment used: Rolling walker (2 wheeled) Transfers: Sit to/from Stand Sit to Stand: Supervision              Balance                                           ADL either performed or assessed with clinical judgement   ADL Overall ADL's : Needs assistance/impaired Eating/Feeding: Modified independent   Grooming: Set up;Standing Grooming Details (indicate cue type and reason): able to complete oral care                 Toilet Transfer: Supervision/safety;RW Toilet Transfer Details (indicate cue type and reason): simulated bed to chair         Functional mobility during ADLs: Supervision/safety;Rolling walker General ADL Comments: pt noted to have orthostatic BP with movement this session see vitals     Vision Baseline Vision/History: Wears glasses Wears Glasses: Reading only Additional Comments: reports having cataracts removed and does not require glasses for distance     Perception     Praxis      Pertinent Vitals/Pain Pain Assessment: No/denies pain     Hand Dominance Right   Extremity/Trunk Assessment Upper Extremity Assessment Upper Extremity Assessment: Overall WFL for tasks assessed   Lower Extremity Assessment Lower Extremity Assessment: Defer to PT evaluation   Cervical / Trunk Assessment Cervical / Trunk Assessment: Normal   Communication Communication Communication: No difficulties   Cognition Arousal/Alertness: Awake/alert Behavior During Therapy: WFL for tasks assessed/performed Overall  Cognitive Status: Within Functional Limits for tasks assessed                                 General Comments: lacks awareness to BP decrease due to lack of symptoms present    General Comments       Exercises     Shoulder Instructions      Home Living Family/patient expects to be discharged to:: Skilled nursing facility ([facility]) Living Arrangements: Spouse/significant  other Available Help at Discharge: Family;Available 24 hours/day Type of Home: House Home Access: Stairs to enter [employer] of Steps: 1 step (in the house)   Home Layout: One level     Bathroom Shower/Tub: Walk-in shower;Tub/shower unit   Bathroom Toilet: Standard         Additional Comments: has 2 steps down into another room but doesn t have to do that unless he wants to go . pt has living room 1 step.        Prior Functioning/Environment Level of Independence: Needs assistance  Gait / Transfers Assistance Needed: SNF [facility] ADL's / Homemaking Assistance Needed: Pt was completely independent prior to TAVR, requires min (A) since june for safety            OT Problem List: Decreased strength;Decreased knowledge of use of DME or AE;Decreased knowledge of precautions;Decreased activity tolerance;Impaired balance (sitting and/or standing);Decreased safety awareness;Cardiopulmonary status limiting activity      OT Treatment/Interventions: Self-care/ADL training;Therapeutic exercise;Patient/family education;Balance training;Energy conservation;Therapeutic activities;DME and/or AE instruction    OT Goals(Current goals can be found in the care plan section) Acute Rehab OT Goals Patient Stated Goal: return to [facility] to get stronger OT Goal Formulation: With patient Time For Goal Achievement: 10/17/19 Potential to Achieve Goals: Good  OT Frequency: Min 2X/week   Barriers to D/C: Decreased caregiver support          Co-evaluation              AM-PAC OT "6 Clicks" Daily Activity     Outcome Measure Help from another person eating meals?: None Help from another person taking care of personal grooming?: A Little Help from another person toileting, which includes using toliet, bedpan, or urinal?: A Little Help from another person bathing (including washing, rinsing, drying)?: A Little Help from another person to put on and taking off regular upper body  clothing?: A Little Help from another person to put on and taking off regular lower body clothing?: A Little 6 Click Score: 19   End of Session Equipment Utilized During Treatment: Gait belt;Rolling walker Nurse Communication: Mobility status;Precautions  Activity Tolerance: Patient tolerated treatment well Patient left: in chair;with call bell/phone within reach;with chair alarm set  OT Visit Diagnosis: Unsteadiness on feet (R26.81);Muscle weakness (generalized) (M62.81)                Time: 6203-5597 OT Time Calculation (min): 38 min Charges:  OT General Charges $OT Visit: 1 Visit OT Evaluation $OT Eval Moderate Complexity: 1 Mod OT Treatments $Self Care/Home Management : 8-22 mins   Brynn, OTR/L  Acute Rehabilitation Services Pager: [phone] Office: [phone] .   Jeri Modena 10/03/2019, 10:09 AM

## 2019-10-03 NOTE — Discharge Instructions (Addendum)
Acute Urinary Retention, Male  Acute urinary retention means that you cannot pee (urinate) at all, or that you pee too little and your bladder is not emptied completely. If it is not treated, it can lead to kidney damage or other serious problems. Follow these instructions at home:  Take over-the-counter and prescription medicines only as told by your doctor. Ask your doctor what medicines you should stay away from. Do not take any medicine unless your doctor says it is okay to do so.  If you were sent home with a tube that drains the bladder (catheter), take care of it as told by your doctor.  Drink enough fluid to keep your pee clear or pale yellow.  If you were given an antibiotic, take it as told by your doctor. Do not stop taking the antibiotic even if you start to feel better.  Do not use any products that contain nicotine or tobacco, such as cigarettes and e-cigarettes. If you need help quitting, ask your doctor.  Watch for changes in your symptoms. Tell your doctor about them.  If told, track changes in your blood pressure at home. Tell your doctor about them.  Keep all follow-up visits as told by your doctor. This is important. Contact a doctor if:  You have spasms or you leak pee when you have spasms. Get help right away if:  You have chills or a fever.  You have a tube that drains the bladder and: ? The tube stops draining pee. ? The tube falls out.  You have blood in your pee. Summary  Acute urinary retention means that you have problems peeing. It may mean that you cannot pee at all, or that you pee too little.  If this condition is not treated, it can lead to kidney damage or other serious problems.  If you were sent home with a tube that drains the bladder, take care of it as told by your doctor.  Monitor any changes in your symptoms. Tell your doctor about any changes. This information is not intended to replace advice given to you by your health care  provider. Make sure you discuss any questions you have with your health care provider. Document Revised: 05/05/2018 Document Reviewed: 03/20/2016 Elsevier Patient Education  [place]. Syncope Syncope is when you pass out (faint) for a short time. It is caused by a sudden decrease in blood flow to the brain. Signs that you may be about to pass out include:  Feeling dizzy or light-headed.  Feeling sick to your stomach (nauseous).  Seeing all white or all black.  Having cold, clammy skin. If you pass out, get help right away. Call your local emergency services (911 in the [place] Do not drive yourself to the hospital. Follow these instructions at home: Watch for any changes in your symptoms. Take these actions to stay safe and help with your symptoms: Lifestyle  Do not drive, use machinery, or play sports until your doctor says it is okay.  Do not drink alcohol.  Do not use any products that contain nicotine or tobacco, such as cigarettes and e-cigarettes. If you need help quitting, ask your doctor.  Drink enough fluid to keep your pee (urine) pale yellow. General instructions  Take over-the-counter and prescription medicines only as told by your doctor.  If you are taking blood pressure or heart medicine, sit up and stand up slowly. Spend a few minutes getting ready to sit and then stand. This can help you feel less  dizzy.  Have someone stay with you until you feel stable.  If you start to feel like you might pass out, lie down right away and raise (elevate) your feet above the level of your heart. Breathe deeply and steadily. Wait until all of the symptoms are gone.  Keep all follow-up visits as told by your doctor. This is important. Get help right away if:  You have a very bad headache.  You pass out once or more than once.  You have pain in your chest, belly, or back.  You have a very fast or uneven heartbeat (palpitations).  It hurts to breathe.  You are  bleeding from your mouth or your bottom (rectum).  You have black or tarry poop (stool).  You have jerky movements that you cannot control (seizure).  You are confused.  You have trouble walking.  You are very weak.  You have vision problems. These symptoms may be an emergency. Do not wait to see if the symptoms will go away. Get medical help right away. Call your local emergency services (911 in the [place] Do not drive yourself to the hospital. Summary  Syncope is when you pass out (faint) for a short time. It is caused by a sudden decrease in blood flow to the brain.  Signs that you may be about to faint include feeling dizzy, light-headed, or sick to your stomach, seeing all white or all black, or having cold, clammy skin.  If you start to feel like you might pass out, lie down right away and raise (elevate) your feet above the level of your heart. Breathe deeply and steadily. Wait until all of the symptoms are gone. This information is not intended to replace advice given to you by your health care provider. Make sure you discuss any questions you have with your health care provider. Document Revised: 03/31/2017 Document Reviewed: 03/31/2017 Elsevier Patient Education  [place]. Nonspecific Chest Pain Chest pain can be caused by many different conditions. Some causes of chest pain can be life-threatening. These will require treatment right away. Serious causes of chest pain include:  Heart attack.  A tear in the body's main blood vessel.  Redness and swelling (inflammation) around your heart.  Blood clot in your lungs. Other causes of chest pain may not be so serious. These include:  Heartburn.  Anxiety or stress.  Damage to bones or muscles in your chest.  Lung infections. Chest pain can feel like:  Pain or discomfort in your chest.  Crushing, pressure, aching, or squeezing pain.  Burning or tingling.  Dull or sharp pain that is worse when you move,  cough, or take a deep breath.  Pain or discomfort that is also felt in your back, neck, jaw, shoulder, or arm, or pain that spreads to any of these areas. It is hard to know whether your pain is caused by something that is serious or something that is not so serious. So it is important to see your doctor right away if you have chest pain. Follow these instructions at home: Medicines  Take over-the-counter and prescription medicines only as told by your doctor.  If you were prescribed an antibiotic medicine, take it as told by your doctor. Do not stop taking the antibiotic even if you start to feel better. Lifestyle   Rest as told by your doctor.  Do not use any products that contain nicotine or tobacco, such as cigarettes, e-cigarettes, and chewing tobacco. If you need help quitting, ask  your doctor.  Do not drink alcohol.  Make lifestyle changes as told by your doctor. These may include: ? Getting regular exercise. Ask your doctor what activities are safe for you. ? Eating a heart-healthy diet. A diet and nutrition specialist (dietitian) can help you to learn healthy eating options. ? Staying at a healthy weight. ? Treating diabetes or high blood pressure, if needed. ? Lowering your stress. Activities such as yoga and relaxation techniques can help. General instructions  Pay attention to any changes in your symptoms. Tell your doctor about them or any new symptoms.  Avoid any activities that cause chest pain.  Keep all follow-up visits as told by your doctor. This is important. You may need more testing if your chest pain does not go away. Contact a doctor if:  Your chest pain does not go away.  You feel depressed.  You have a fever. Get help right away if:  Your chest pain is worse.  You have a cough that gets worse, or you cough up blood.  You have very bad (severe) pain in your belly (abdomen).  You pass out (faint).  You have either of these for no clear  reason: ? Sudden chest discomfort. ? Sudden discomfort in your arms, back, neck, or jaw.  You have shortness of breath at any time.  You suddenly start to sweat, or your skin gets clammy.  You feel sick to your stomach (nauseous).  You throw up (vomit).  You suddenly feel lightheaded or dizzy.  You feel very weak or tired.  Your heart starts to beat fast, or it feels like it is skipping beats. These symptoms may be an emergency. Do not wait to see if the symptoms will go away. Get medical help right away. Call your local emergency services (911 in the [place] Do not drive yourself to the hospital. Summary  Chest pain can be caused by many different conditions. The cause may be serious and need treatment right away. If you have chest pain, see your doctor right away.  Follow your doctor's instructions for taking medicines and making lifestyle changes.  Keep all follow-up visits as told by your doctor. This includes visits for any further testing if your chest pain does not go away.  Be sure to know the signs that show that your condition has become worse. Get help right away if you have these symptoms. This information is not intended to replace advice given to you by your health care provider. Make sure you discuss any questions you have with your health care provider. Document Revised: 08/19/2017 Document Reviewed: 08/19/2017 Elsevier Patient Education  [place].  Orthostatic Hypotension Blood pressure is a measurement of how strongly, or weakly, your blood is pressing against the walls of your arteries. Orthostatic hypotension is a sudden drop in blood pressure that happens when you quickly change positions, such as when you get up from sitting or lying down. Arteries are blood vessels that carry blood from your heart throughout your body. When blood pressure is too low, you may not get enough blood to your brain or to the rest of your organs. This can cause weakness,  light-headedness, rapid heartbeat, and fainting. This can last for just a few seconds or for up to a few minutes. Orthostatic hypotension is usually not a serious problem. However, if it happens frequently or gets worse, it may be a sign of something more serious. What are the causes? This condition may be caused by:  Sudden  changes in posture, such as standing up quickly after you have been sitting or lying down.  Blood loss.  Loss of body fluids (dehydration).  Heart problems.  Hormone (endocrine) problems.  Pregnancy.  Severe infection.  Lack of certain nutrients.  Severe allergic reactions (anaphylaxis).  Certain medicines, such as blood pressure medicine or medicines that make the body lose excess fluids (diuretics). Sometimes, this condition can be caused by not taking medicine as directed, such as taking too much of a certain medicine. What increases the risk? The following factors may make you more likely to develop this condition:  Age. Risk increases as you get older.  Conditions that affect the heart or the central nervous system.  Taking certain medicines, such as blood pressure medicine or diuretics.  Being pregnant. What are the signs or symptoms? Symptoms of this condition may include:  Weakness.  Light-headedness.  Dizziness.  Blurred vision.  Fatigue.  Rapid heartbeat.  Fainting, in severe cases. How is this diagnosed? This condition is diagnosed based on:  Your medical history.  Your symptoms.  Your blood pressure measurement. Your health care provider will check your blood pressure when you are: ? Lying down. ? Sitting. ? Standing. A blood pressure reading is recorded as two numbers, such as "120 over 80" (or 120/80). The first ("top") number is called the systolic pressure. It is a measure of the pressure in your arteries as your heart beats. The second ("bottom") number is called the diastolic pressure. It is a measure of the pressure  in your arteries when your heart relaxes between beats. Blood pressure is measured in a unit called mm Hg. Healthy blood pressure for most adults is 120/80. If your blood pressure is below 90/60, you may be diagnosed with hypotension. Other information or tests that may be used to diagnose orthostatic hypotension include:  Your other vital signs, such as your heart rate and temperature.  Blood tests.  Tilt table test. For this test, you will be safely secured to a table that moves you from a lying position to an upright position. Your heart rhythm and blood pressure will be monitored during the test. How is this treated? This condition may be treated by:  Changing your diet. This may involve eating more salt (sodium) or drinking more water.  Taking medicines to raise your blood pressure.  Changing the dosage of certain medicines you are taking that might be lowering your blood pressure.  Wearing compression stockings. These stockings help to prevent blood clots and reduce swelling in your legs. In some cases, you may need to go to the hospital for:  Fluid replacement. This means you will receive fluids through an IV.  Blood replacement. This means you will receive donated blood through an IV (transfusion).  Treating an infection or heart problems, if this applies.  Monitoring. You may need to be monitored while medicines that you are taking wear off. Follow these instructions at home: Eating and drinking   Drink enough fluid to keep your urine pale yellow.  Eat a healthy diet, and follow instructions from your health care provider about eating or drinking restrictions. A healthy diet includes: ? Fresh fruits and vegetables. ? Whole grains. ? Lean meats. ? Low-fat dairy products.  Eat extra salt only as directed. Do not add extra salt to your diet unless your health care provider told you to do that.  Eat frequent, small meals.  Avoid standing up suddenly after  eating. Medicines  Take over-the-counter and prescription  medicines only as told by your health care provider. ? Follow instructions from your health care provider about changing the dosage of your current medicines, if this applies. ? Do not stop or adjust any of your medicines on your own. General instructions   Wear compression stockings as told by your health care provider.  Get up slowly from lying down or sitting positions. This gives your blood pressure a chance to adjust.  Avoid hot showers and excessive heat as directed by your health care provider.  Return to your normal activities as told by your health care provider. Ask your health care provider what activities are safe for you.  Do not use any products that contain nicotine or tobacco, such as cigarettes, e-cigarettes, and chewing tobacco. If you need help quitting, ask your health care provider.  Keep all follow-up visits as told by your health care provider. This is important. Contact a health care provider if you:  Vomit.  Have diarrhea.  Have a fever for more than 2-3 days.  Feel more thirsty than usual.  Feel weak and tired. Get help right away if you:  Have chest pain.  Have a fast or irregular heartbeat.  Develop numbness in any part of your body.  Cannot move your arms or your legs.  Have trouble speaking.  Become sweaty or feel light-headed.  Faint.  Feel short of breath.  Have trouble staying awake.  Feel confused. Summary  Orthostatic hypotension is a sudden drop in blood pressure that happens when you quickly change positions.  Orthostatic hypotension is usually not a serious problem.  It is diagnosed by having your blood pressure taken lying down, sitting, and then standing.  It may be treated by changing your diet or adjusting your medicines. This information is not intended to replace advice given to you by your health care provider. Make sure you discuss any questions you have  with your health care provider. Document Revised: 08/12/2017 Document Reviewed: 08/12/2017 Elsevier Patient Education  [place].

## 2019-10-03 NOTE — NC FL2 (Signed)
[place] LEVEL OF CARE SCREENING TOOL     IDENTIFICATION  Patient Name: Alexis Griffith Birthdate: August 07, 1940 Sex: male Admission Date (Current Location): 09/30/2019  [facility] and [place] Number:  Herbalist and Address:  [facility], [place] [address] [place], [place] [address]      Provider Number:    Attending Physician Name and Address:  Kayleen Memos, DO  Relative Name and Phone Number:  Nikash Mortensen [phone]    Current Level of Care: Hospital Recommended Level of Care: [place] Prior Approval Number:    Date Approved/Denied:   PASRR Number: [id number]  Discharge Plan: SNF    Current Diagnoses: Patient Active Problem List   Diagnosis Date Noted  . Syncope   . Syncope and collapse 10/01/2019  . Abdominal pain   . CHF (congestive heart failure) ([place]) 09/25/2019  . Demand ischemia ([place]) 09/22/2019  . Premature atrial contractions 09/12/2019  . Orthostatic hypotension 09/11/2019  . Urinary retention 09/11/2019  . Mild anemia 09/11/2019  . Hypomagnesemia 09/11/2019  . NSTEMI (non-ST elevated myocardial infarction) ([place]) 09/09/2019  . Elevated troponin   . Chest pain   . Hypertensive urgency   . Acute on chronic diastolic heart failure ([place]) 08/15/2019  . 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 Diabetes mellitus type 2 in nonobese (HCC)   . Severe aortic stenosis   . Sleep apnea   . Renal mass   . Coronary artery disease involving native coronary artery of native heart with angina pectoris ([place]) 07/04/2019  . 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 Excessive urination at night 06/05/2013  . TIA (transient ischemic attack) 07/27/2012  . Giant cell arteritis ([place]) 05/17/2012  . Carotid artery disease ([place]) 08/24/2011  . PVC's (premature ventricular contractions)   . Labile hypertension   . Hyperlipidemia LDL goal <70   . Anxiety   . Sinus bradycardia on ECG      Orientation RESPIRATION BLADDER Height & Weight     Self, Time, Situation, Place  Normal External catheter (chronic catheter) Weight: 181 lb 4.8 oz (82.2 kg) (scale b) Height:  5\' 10"  (177.8 cm)  BEHAVIORAL SYMPTOMS/MOOD NEUROLOGICAL BOWEL NUTRITION STATUS      Continent Diet (See discharge summary)  AMBULATORY STATUS COMMUNICATION OF NEEDS Skin   Supervision Verbally Other (Comment) (MASD, ecchymosis, arm, bilateral, groin, scrotum)                       Personal Care Assistance Level of Assistance  Bathing, Feeding, Dressing Bathing Assistance: Limited assistance Feeding assistance: Independent Dressing Assistance: Limited assistance     Functional Limitations Info  Sight, Hearing Sight Info: Impaired Hearing Info: Impaired Speech Info: Adequate    SPECIAL CARE FACTORS FREQUENCY  PT (By licensed PT), OT (By licensed OT)     PT Frequency: 5x a week OT Frequency: 5x week            Contractures Contractures Info: Not present    Additional Factors Info  Code Status Code Status Info: Full Allergies Info: macrodantin, sulfa antibiotics, lisinopril, penicillins           Current Medications (10/03/2019):  This is the current hospital active medication list Current Facility-Administered Medications  Medication Dose Route Frequency Provider Last Rate Last Admin  . acetaminophen (TYLENOL) tablet 650 mg  650 mg Oral Q6H PRN Sid Falcon, MD   650 mg at 10/01/19 1934  . aspirin chewable  tablet 81 mg  81 mg Oral Daily Gilles Chiquito B, MD   81 mg at 10/03/19 1045  . Chlorhexidine Gluconate Cloth 2 % PADS 6 each  6 each Topical Daily Kayleen Memos, DO   6 each at 10/02/19 1043  . clonazePAM (KLONOPIN) tablet 0.5 mg  0.5 mg Oral QID Gilles Chiquito B, MD   0.5 mg at 10/03/19 1045  . enoxaparin (LOVENOX) injection 40 mg  40 mg Subcutaneous Daily Gilles Chiquito B, MD   40 mg at 10/03/19 1045  . fenofibrate tablet 160 mg  160 mg Oral Daily Gilles Chiquito B, MD   160 mg  at 10/03/19 1045  . insulin aspart (novoLOG) injection 0-15 Units  0-15 Units Subcutaneous TID WC Sid Falcon, MD   5 Units at 10/03/19 1250  . insulin aspart (novoLOG) injection 0-5 Units  0-5 Units Subcutaneous QHS Gilles Chiquito B, MD      . isosorbide mononitrate (IMDUR) 24 hr tablet 30 mg  30 mg Oral Daily Gilles Chiquito B, MD   30 mg at 10/03/19 1045  . losartan (COZAAR) tablet 50 mg  50 mg Oral Daily Gilles Chiquito B, MD   50 mg at 10/03/19 1045  . magnesium oxide (MAG-OX) tablet 400 mg  400 mg Oral Daily Gilles Chiquito B, MD   400 mg at 10/03/19 1045  . metoprolol tartrate (LOPRESSOR) tablet 50 mg  50 mg Oral BID Gilles Chiquito B, MD   50 mg at 10/03/19 1045  . nitroGLYCERIN (NITROSTAT) SL tablet 0.4 mg  0.4 mg Sublingual Q5 Min x 3 PRN Gilles Chiquito B, MD   0.4 mg at 10/01/19 1928  . ondansetron (ZOFRAN) injection 4 mg  4 mg Intravenous Q6H PRN Gilles Chiquito B, MD      . polyethylene glycol (MIRALAX / GLYCOLAX) packet 17 g  17 g Oral Daily PRN Sid Falcon, MD   17 g at 10/03/19 0025  . polyvinyl alcohol (LIQUIFILM TEARS) 1.4 % ophthalmic solution 1 drop  1 drop Both Eyes Q12H PRN Gilles Chiquito B, MD      . ranolazine (RANEXA) 12 hr tablet 500 mg  500 mg Oral BID Sande Rives E, PA-C   500 mg at 10/03/19 1045  . rosuvastatin (CRESTOR) tablet 20 mg  20 mg Oral QHS Gilles Chiquito B, MD   20 mg at 10/02/19 2149  . sodium chloride flush (NS) 0.9 % injection 3 mL  3 mL Intravenous Q12H Sid Falcon, MD   3 mL at 10/03/19 1046     Discharge Medications: Please see discharge summary for a list of discharge medications.  Relevant Imaging Results:  Relevant Lab Results:   Additional Information [phone]  Carolin Sicks, [place]

## 2019-10-03 NOTE — TOC Progression Note (Signed)
Transition of Care [facility]) - Progression Note    Patient Details  Name: BUEFORD ARP MRN: [id number] Date of Birth: Jun 24, 1940  Transition of Care [facility]) CM/SW [place], [place] Phone Number: 10/03/2019, 2:37 PM  Clinical Narrative:    Insurance Josem Kaufmann was started for SNF placement.  Auth reference [id number].  TOC team will continue to assist with disposition planning.  Expected Discharge Plan: [place] Barriers to Discharge: Continued Medical Work up  Expected Discharge Plan and Services Expected Discharge Plan: [place] In-house Referral: Clinical Social Work     Living arrangements for the past 2 months: Single Family Home Expected Discharge Date: 10/03/19                                     Social Determinants of Health (SDOH) Interventions    Readmission Risk Interventions No flowsheet data found.

## 2019-10-04 DIAGNOSIS — R079 Chest pain, unspecified: Secondary | ICD-10-CM

## 2019-10-04 LAB — GLUCOSE, CAPILLARY
Glucose-Capillary: 201 mg/dL — ABNORMAL HIGH (ref 70–99)
Glucose-Capillary: 262 mg/dL — ABNORMAL HIGH (ref 70–99)
Glucose-Capillary: 135 mg/dL — ABNORMAL HIGH (ref 70–99)
Glucose-Capillary: 217 mg/dL — ABNORMAL HIGH (ref 70–99)

## 2019-10-04 MED ORDER — ALUM & MAG HYDROXIDE-SIMETH 200-200-20 MG/5ML PO SUSP
15.0000 mL | ORAL | Status: AC | PRN
  Administered 2019-10-04 (×2): 15 mL via ORAL
  Filled 2019-10-04 (×2): qty 30

## 2019-10-04 MED ORDER — NYSTATIN 100000 UNIT/GM EX POWD
Freq: Three times a day (TID) | CUTANEOUS | Status: DC
  Filled 2019-10-04 (×2): qty 15

## 2019-10-04 NOTE — Progress Notes (Signed)
Patient ID: Alexis Griffith, male   DOB: [birth year], 79 y.o.   MRN: [id number]  I came by to see [name] as a courtesy at his request.  I answered questions about his catheter.  He does have some redness on scrotal and inner thigh skin and I recommended using a barrier cream to prevent irritation from any catheter leakage but explained that leakage of urine during bowel movements are expected as this may precipitate a bladder spasm.  I told him I will send Dr. Tresa Moore a message to let him know [name] is in the hospital so that he can rescheduled his missed appointment to further address his urinary retention and his renal mass.  He is ok to be discharged from a urologic perspective.

## 2019-10-04 NOTE — Progress Notes (Signed)
Patient reports leak around his foley catheter and requests urology consult. States he missed his appointment with Dr. Tresa Moore on 10/02/19. Unclear about the leak on physical exam.  Called urology for consult.  Does have skin candidiasis around his groins. Started nystatin powder TID.  Discussed with Dr. Rose Phi who will review his chart.

## 2019-10-04 NOTE — Discharge Summary (Signed)
Discharge Summary  Alexis Griffith QPR:[id number] DOB: [birth year]  PCP: Haywood Pao, MD  Admit date: 09/30/2019 Discharge date: 10/04/2019  Time spent: 35 minutes  Recommendations for Outpatient Follow-up:  1. Follow-up with cardiology 2. Follow-up with your urologist 3. Follow-up with your primary care provider 4. Take your medications as described 5. Continue PT OT with assistance and fall precautions.  Recommendations per cardiology: Medication Recommendations:  Ranexa 500mg  BID, ASA 81mg  daily, fenofibrate 160mg  daily, Imdur 30mg  daily, Losartan 50mg  daily, Lopressor 50mg  BID and Crestor 20mg  daily.   Other recommendations (labs, testing, etc):  event monitor Follow up as an outpatient:  F/U with [facility]>>appt on 10/16/19  Discharge Diagnoses:  Active Hospital Problems   Diagnosis Date Noted  . Syncope   . Syncope and collapse 10/01/2019  . CHF (congestive heart failure) ([place]) 09/25/2019  . Orthostatic hypotension 09/11/2019  . Urinary retention 09/11/2019  . Chest pain   . 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 Diabetes mellitus type 2 in nonobese (HCC)   . Severe aortic stenosis   . Coronary artery disease involving native coronary artery of native heart with angina pectoris ([place]) 07/04/2019  . Carotid artery disease ([place]) 08/24/2011  . Labile hypertension     Resolved Hospital Problems  No resolved problems to display.    Discharge Condition: Stable  Diet recommendation: Heart healthy carb modified diet  Vitals:   10/04/19 0922 10/04/19 0923  BP:    Pulse:    Resp:  18  Temp: 97.7 F (36.5 C)   SpO2:      History of present illness:  Alexis Griffith a 79 y.o.malewith medical history significant ofCAD, stroke, AS s/p AVR, renal mass with concern for renal carcinoma, orthostatic hypotension and recurrent syncope, HTN, HLD, h/o GCA, DM2, HFpEF (TTE 09/10/19 showed normal EF with a Grade 1 diastolic dysfunction and  functional AVR), carotid artery disease, urinary retention with chronic foley and chronic anxiety who presents for chest pain. The patient reports that on Saturday (day of admission) patient developed a tightening up of his chest. It was similar feeling to his previous NSTEMI, but not as bad. About a 5/10. Pain did not radiate. He is currently at [facility] for rehab and he notes that he gets very nervous on the weekends as they have lower staff and he is concerned something will happen to him. He is on 4X per day anxiolytic at this time. He was brought to the ED and after triage was placed in the waiting room. He reports at that time having some issues with anxiety as well given he was surrounded by people and he was worried about how long it would take to be seen. He developed dizziness and then does not remember anything prior to being in his ED room. Per report from EDP, patient briefly lost consciousness and was reported that he lost a pulse, though this was for a brief time. He awoke upon being brought into triage. He has a history of recurrent syncope due to orthostatic hypotension and has at times been taken off many of his blood pressure medications. Further symptoms include discomfort from the foley catheter (has had to cancel follow up with urology 3X to do voiding trials) and some LLQ pain which he is not sure if coming from his bladder or bowels. He denies constipation. He denies heartburn, vomiting, diarrhea, fever, chills, recent illness.   Review of recent hospitalizations:  08/15/19 --> 08/31/19 TAVR on 08/15/19. Hospital  course complicated by a peri procedural small infarct of the left medial frontal cortex, recurrent syncope due to orthostasis (only on metoprolol on discharge), acute urinary retention requiring coude catheterization and follow up with Urology, UTI and epididymoorchitis for which he was treated with fosfomycin and pulmonary nodules found on PET scan.   09/08/19  --> 09/12/19 Presented for Chest pain, found to have NSTEMI. He was deemed not to be interventional at that time due to urinary bleeding requiring cessation of plavix previously, so plan was for medical treatment. Continue ASA, statin titration, anti-anginals including amlodipine and IMDUR. Foley was in place for urinary retention. He is noted to have renal cell carcinoma that is well known and following with Urology. Patient was discharged to [facility] home.   09/19/19 --> 09/28/19 Admission for chest pain again. Troponins did not rise >100. He was managed with home medications. BP was labile with highs and lows. Noted to have issues with orthostasis. Discharged back to [facility].   Elevated troponin S peaked at 25.  Episode of chest pain the night of 10/01/19 and 10/02/19.  Seen by cardiology, ongoing cardiac meds adjustment.  10/03/19: No new complaints.  No acute events overnight. Seen by cardiology, ordered 30-day event monitor for further evaluation of syncope.  Signed off.  10/04/19: No acute events.  Awaiting bed placement.  Hospital Course:  Active Problems:   Labile hypertension   Carotid artery disease (HCC)   Coronary artery disease involving native coronary artery of native heart with angina pectoris (HCC)   Diabetes mellitus type 2 in nonobese (HCC)   Severe aortic stenosis   S/P TAVR (transcatheter aortic valve replacement)   Chest pain   Orthostatic hypotension   Urinary retention   CHF (congestive heart failure) (HCC)   Syncope and collapse   Syncope   Syncope, likely orthostatic in origin -Initially positive orthostatic vital signs -Cardiology will add thigh-high compression hose and will order event monitor as outpatient to evaluate for arrhythmias. -TAVR stable on 2D echo. -Follow-up with cardiology outpatient.  Essential hypertension -Medications adjusted by cardiology Recommendations as stated above.  Chest pain rule out ACS Coronary artery disease  involving native coronary artery of native heart with angina pectoris - He has had an extensive course of hospitalizations recently, worsened by anxiety.  Ranexa added by cardiology -Cardiac medications as noted above  Chronic anxiety Resume prior to admission regimen Follow up with your PCP  LLQ abdominal pain, resolved Abdominal x-ray with no acute findings.  Carotid artery disease (HCC) - Continue home aspirin and statin -Plavix was held due to hematuria Follow-up with cardiology outpatient  Diabetes mellitus type 2 in nonobese [facility]) with hyperglycemia Hemoglobin A1c 6.4 and 08/17/2019 Resume prior to admission regimen Follow-up with your PCP  Severe aortic stenosis S/P TAVR (transcatheter aortic valve replacement) - At last check AVR was working well -TAVR stable on 2D echo done on 10/02/2019  Urinary retention with indwelling Foley catheter -Follow-up with urology  Chronic HFpEF -Last 2D echo done on 09/10/2019 showed LVEF 60 to 65% with grade 1 diastolic dysfunction Euvolemic on exam Net I&O -5.8 L Continue medications as recommended by cardiology.  Ambulatory dysfunction PT OT recommended SNF Continue PT OT with assistance and fall precautions.    Code Status:Full  Discharge Exam: BP (!) 140/58 (BP Location: Right Arm)   Pulse 67   Temp 97.7 F (36.5 C) (Oral)   Resp 18   Ht 5\' 10"  (1.778 m)   Wt 82.5 kg Comment: scale b  SpO2 96%   BMI 26.09 kg/m  . General: 79 y.o. year-old male well developed well nourished in no acute distress.  Alert and oriented x3. . Cardiovascular: Regular rate and rhythm with no rubs or gallops.  No thyromegaly or JVD noted.   Marland Kitchen Respiratory: Clear to auscultation with no wheezes or rales. Good inspiratory effort. . Abdomen: Soft nontender nondistended with normal bowel sounds x4 quadrants. . Musculoskeletal: No lower extremity edema. 2/4 pulses in all 4 extremities. Marland Kitchen Psychiatry: Mood is appropriate  for condition and setting  Discharge Instructions You were cared for by a hospitalist during your hospital stay. If you have any questions about your discharge medications or the care you received while you were in the hospital after you are discharged, you can call the unit and asked to speak with the hospitalist on call if the hospitalist that took care of you is not available. Once you are discharged, your primary care physician will handle any further medical issues. Please note that NO REFILLS for any discharge medications will be authorized once you are discharged, as it is imperative that you return to your primary care physician (or establish a relationship with a primary care physician if you do not have one) for your aftercare needs so that they can reassess your need for medications and monitor your lab values.   Allergies as of 10/04/2019      Reactions   Macrodantin [nitrofurantoin] Other (See Comments)   "blocked kidneys"    Tape Other (See Comments)   "Tears my skin and leaves marks." PLEASE USE COBAN!!   Sulfa Antibiotics Rash   Lisinopril Cough   Nutritional Supplements Other (See Comments)   Protein drink given to me "did something"   Penicillins Other (See Comments)   08/30/19- Patient can't remember, was 35+ years ago. OK with trying cephalexin in hospital      Medication List    STOP taking these medications   amLODipine 5 MG tablet Commonly known as: NORVASC   Trilipix 135 MG capsule Generic drug: Choline Fenofibrate Replaced by: fenofibrate 160 MG tablet     TAKE these medications   acetaminophen 325 MG tablet Commonly known as: TYLENOL Take 650 mg by mouth every 6 (six) hours as needed for mild pain or moderate pain.   aspirin 81 MG chewable tablet Chew 1 tablet (81 mg total) by mouth daily.   carboxymethylcellulose 0.5 % Soln Commonly known as: REFRESH PLUS Place 1 drop into both eyes every 12 (twelve) hours as needed (dry eyes).   clonazePAM 0.5 MG  tablet Commonly known as: KLONOPIN Take 1 tablet (0.5 mg total) by mouth 4 (four) times daily.   Coenzyme Q10 200 MG capsule Take 200 mg by mouth daily.   fenofibrate 160 MG tablet Take 1 tablet (160 mg total) by mouth daily. Replaces: Trilipix 135 MG capsule   fosfomycin 3 g Pack Commonly known as: MONUROL Take 3 g by mouth every 3 (three) days. Mix 3 grams into 6 ounces of water and drink once daily every 3 days, for 10 days   isosorbide mononitrate 30 MG 24 hr tablet Commonly known as: IMDUR Take 1 tablet (30 mg total) by mouth daily. What changed:   medication strength  how much to take   losartan 50 MG tablet Commonly known as: COZAAR Take 1 tablet (50 mg total) by mouth daily.   magnesium oxide 400 (241.3 Mg) MG tablet Commonly known as: MAG-OX Take 1 tablet (400 mg total) by mouth daily.  metoprolol tartrate 50 MG tablet Commonly known as: LOPRESSOR Take 1 tablet (50 mg total) by mouth 2 (two) times daily.   nitroGLYCERIN 0.4 MG SL tablet Commonly known as: NITROSTAT Place 1 tablet (0.4 mg total) under the tongue every 5 (five) minutes as needed for chest pain (up to 3 doses). Do not give if blood pressure is low (less than 810 systolic). What changed:   when to take this  reasons to take this  additional instructions   NovoLOG FlexPen 100 UNIT/ML FlexPen Generic drug: insulin aspart Inject 1-9 Units into the skin See admin instructions. Inject as per sliding scale: If 70-120= 0 units; 121-150= 1 unit; 151-200= 2 units; 201-250= 3 units; 251-300= 5 units; 301-350= 7 units; 351-400= 9 units GREATER THAN 400 CALL MD, subcutaneously before meals for DM What changed: Another medication with the same name was removed. Continue taking this medication, and follow the directions you see here.   glucose blood test strip 1 each by Other route as needed.   ONE TOUCH ULTRA TEST test strip Generic drug: glucose blood 1 each by Other route daily.   OneTouch Delica  Plus [id number] Misc 1 each by Other route daily.   polyethylene glycol 17 g packet Commonly known as: MIRALAX / GLYCOLAX Take 17 g by mouth daily as needed for mild constipation.   ranolazine 500 MG 12 hr tablet Commonly known as: RANEXA Take 1 tablet (500 mg total) by mouth 2 (two) times daily.   rosuvastatin 20 MG tablet Commonly known as: CRESTOR Take 1 tablet (20 mg total) by mouth daily. What changed: when to take this      Allergies  Allergen Reactions  . Macrodantin [Nitrofurantoin] Other (See Comments)    "blocked kidneys"   . Tape Other (See Comments)    "Tears my skin and leaves marks." PLEASE USE COBAN!!  . Sulfa Antibiotics Rash  . Lisinopril Cough  . Nutritional Supplements Other (See Comments)    Protein drink given to me "did something"  . Penicillins Other (See Comments)    08/30/19- Patient can't remember, was 35+ years ago. OK with trying cephalexin in hospital    Follow-up Information    Tisovec, Fransico Him, MD. Call in 1 day(s).   Specialty: Internal Medicine Why: PLease call for a post hospital follow up appointment Contact information: [place] [address] [phone]        Deboraha Sprang, MD .   Specialty: Cardiology Contact information: [address] [address] [phone]        Eileen Stanford, PA-C Follow up.   Specialties: Cardiology, Radiology Why: Follow-up scheduled for 10/16/2019 at 2:30pm. Please arrive 15 minutes early for check-in. Contact information: [address] [place] [address] [phone]                The results of significant diagnostics from this hospitalization (including imaging, microbiology, ancillary and laboratory) are listed below for reference.    Significant Diagnostic Studies: DG Chest 2 View  Result Date: 10/01/2019 CLINICAL DATA:  Chest pain EXAM: CHEST - 2 VIEW COMPARISON:  September 19, 2019 FINDINGS: There is mild  cardiomegaly. Aortic valve stent is noted. Aortic knob calcifications are seen. Both lungs are clear. No pleural effusion. No acute osseous abnormality is seen. IMPRESSION: No active cardiopulmonary disease. Electronically Signed   By: Prudencio Pair M.D.   On: 10/01/2019 00:49   DG Chest 2 View  Result Date: 09/08/2019  CLINICAL DATA:  Chest pain and shortness of breath. EXAM: CHEST - 2 VIEW COMPARISON:  August 11, 2019 FINDINGS: There is no evidence of acute infiltrate, pleural effusion or pneumothorax. A stable subcentimeter calcified nodular opacity is seen overlying the upper left lung. The heart size and mediastinal contours are within normal limits. An artificial aortic valve is seen. This represents a new finding when compared to the prior study. The visualized skeletal structures are unremarkable. IMPRESSION: No active cardiopulmonary disease. Electronically Signed   By: Virgina Norfolk M.D.   On: 09/08/2019 22:16   DG Chest Port 1 View  Result Date: 09/19/2019 CLINICAL DATA:  79 year old male with chest pain. EXAM: PORTABLE CHEST 1 VIEW COMPARISON:  Chest radiograph dated 09/08/2019. FINDINGS: There are bibasilar linear atelectasis/scarring. No focal consolidation, pleural effusion, or pneumothorax. Probable background of mild emphysema. There is mild cardiomegaly. Coronary vascular calcification and aortic valve repair. Atherosclerotic calcification of the aortic arch. No acute osseous pathology. IMPRESSION: 1. No acute cardiopulmonary process. 2. Mild cardiomegaly. Electronically Signed   By: Anner Crete M.D.   On: 09/19/2019 15:16   DG Abd 2 Views  Result Date: 10/01/2019 CLINICAL DATA:  Abdominal pain EXAM: ABDOMEN - 2 VIEW COMPARISON:  CT, 08/24/2019. FINDINGS: Normal bowel gas pattern. Scattered aortoiliac atherosclerotic calcifications. No evidence of renal or ureteral stones. Soft tissues otherwise unremarkable. No acute skeletal abnormality. IMPRESSION: 1. No acute findings.  Normal  bowel gas pattern. Electronically Signed   By: Lajean Manes M.D.   On: 10/01/2019 05:14   ECHOCARDIOGRAM COMPLETE  Result Date: 10/02/2019    ECHOCARDIOGRAM REPORT   Patient Name:   Alexis Griffith Date of Exam: 10/02/2019 Medical Rec #:  [id number]      Height:       70.0 in Accession #:    [id number]     Weight:       181.9 lb Date of Birth:  02-11-1941      BSA:          2.005 m Patient Age:    84 years       BP:           131/57 mmHg Patient Gender: M              HR:           65 bpm. Exam Location:  Inpatient Procedure: 2D Echo, 3D Echo and Strain Analysis                                MODIFIED REPORT:    This report was modified by Cherlynn Kaiser MD on 10/02/2019 due to report                                    revision.  Indications:     Chest Pain 786.50 / R07.9                  Syncope 780.2 / R55  History:         Patient has prior history of Echocardiogram examinations, most                  recent 09/12/2019. CHF, CAD and Previous Myocardial Infarction,                  Carotid Disease, Arrythmias:PVC, Signs/Symptoms:Chest Pain and  Hypotension; Risk Factors:Sleep Apnea.                  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 Procedure Date:                  08/15/2019.  Sonographer:     Darlina Sicilian RDCS Referring Phys:  [id number] Darreld Mclean Diagnosing Phys: Cherlynn Kaiser MD IMPRESSIONS  1. Left ventricular ejection fraction, by estimation, is 60 to 65%. The left ventricle has normal function. The left ventricle has no regional wall motion abnormalities. There is moderate asymmetric left ventricular hypertrophy of the septal segment. Left ventricular diastolic parameters are consistent with Grade I diastolic dysfunction (impaired relaxation).  2. Right ventricular systolic function is normal. The right ventricular size is normal.  3. The mitral valve is normal in structure. Trivial mitral valve  regurgitation. No evidence of mitral stenosis.  4. The aortic valve has been repaired/replaced. Aortic valve regurgitation No paravalvular or valvular regurgitation. 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 Procedure Date: 08/15/2019. Echo findings are consistent with normal structure and function of the aortic valve prosthesis. Aortic valve mean gradient measures 13.0 mmHg.  5. The inferior vena cava is normal in size with greater than 50% respiratory variability, suggesting right atrial pressure of 3 mmHg. Comparison(s): Stable prosthetic aortic valve systolic gradient compared to 09/12/19. FINDINGS  Left Ventricle: Left ventricular ejection fraction, by estimation, is 60 to 65%. The left ventricle has normal function. The left ventricle has no regional wall motion abnormalities. The left ventricular internal cavity size was normal in size. There is  moderate asymmetric left ventricular hypertrophy of the septal segment. Left ventricular diastolic parameters are consistent with Grade I diastolic dysfunction (impaired relaxation). Right Ventricle: The right ventricular size is normal. No increase in right ventricular wall thickness. Right ventricular systolic function is normal. Left Atrium: Left atrial size was normal in size. Right Atrium: Right atrial size was normal in size. Pericardium: There is no evidence of pericardial effusion. Mitral Valve: The mitral valve is normal in structure. Normal mobility of the mitral valve leaflets. Trivial mitral valve regurgitation. No evidence of mitral valve stenosis. Tricuspid Valve: The tricuspid valve is normal in structure. Tricuspid valve regurgitation is trivial. No evidence of tricuspid stenosis. Aortic Valve: The aortic valve has been repaired/replaced. Aortic valve regurgitation No paravalvular or valvular regurgitation. Aortic valve mean gradient measures 13.0 mmHg. Aortic valve peak gradient measures 23.3 mmHg. Aortic  valve area, by VTI measures 1.79 cm. 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 Procedure Date: 08/15/2019. Echo findings are consistent with normal structure and function of the aortic valve prosthesis. Pulmonic Valve: The pulmonic valve was normal in structure. Pulmonic valve regurgitation is trivial. No evidence of pulmonic stenosis. Aorta: The aortic root is normal in size and structure. Venous: The inferior vena cava is normal in size with greater than 50% respiratory variability, suggesting right atrial pressure of 3 mmHg. IAS/Shunts: No atrial level shunt detected by color flow Doppler.  LEFT VENTRICLE PLAX 2D LVIDd:         5.10 cm      Diastology LVIDs:         3.20 cm      LV e' lateral:  7.13 cm/s LV PW:         0.90 cm      LV E/e' lateral: 10.9 LV IVS:        1.40 cm      LV e' medial:    4.80 cm/s LVOT diam:     2.50 cm      LV E/e' medial:  16.2 LV SV:         87 LV SV Index:   43 LVOT Area:     4.91 cm  LV Volumes (MOD) LV vol d, MOD A2C: 134.0 ml LV vol d, MOD A4C: 114.0 ml LV vol s, MOD A2C: 54.1 ml LV vol s, MOD A4C: 59.2 ml LV SV MOD A2C:     79.9 ml LV SV MOD A4C:     114.0 ml LV SV MOD BP:      69.3 ml RIGHT VENTRICLE RV S prime:     16.80 cm/s TAPSE (M-mode): 2.5 cm LEFT ATRIUM             Index       RIGHT ATRIUM           Index LA diam:        3.80 cm 1.90 cm/m  RA Area:     12.20 cm LA Vol (A2C):   43.8 ml 21.85 ml/m RA Volume:   23.40 ml  11.67 ml/m LA Vol (A4C):   41.1 ml 20.50 ml/m LA Biplane Vol: 45.1 ml 22.50 ml/m  AORTIC VALVE AV Area (Vmax):    1.75 cm AV Area (Vmean):   1.74 cm AV Area (VTI):     1.79 cm AV Vmax:           241.50 cm/s AV Vmean:          167.750 cm/s AV VTI:            0.484 m AV Peak Grad:      23.3 mmHg AV Mean Grad:      13.0 mmHg LVOT Vmax:         86.20 cm/s LVOT Vmean:        59.300 cm/s LVOT VTI:          0.177 m LVOT/AV VTI ratio: 0.37  AORTA Ao Asc diam: 3.40 cm MITRAL VALVE MV Area (PHT): 2.29  cm    SHUNTS MV Decel Time: 331 msec    Systemic VTI:  0.18 m MV E velocity: 77.60 cm/s  Systemic Diam: 2.50 cm MV A velocity: 90.80 cm/s MV E/A ratio:  0.85 Cherlynn Kaiser MD Electronically signed by Cherlynn Kaiser MD Signature Date/Time: 10/02/2019/7:46:50 PM    Final (Updated)    ECHOCARDIOGRAM LIMITED  Result Date: 09/12/2019    ECHOCARDIOGRAM LIMITED REPORT   Patient Name:   Alexis Griffith Date of Exam: 09/10/2019 Medical Rec #:  [id number]      Height:       70.0 in Accession #:    [id number]     Weight:       188.5 lb Date of Birth:  Jan 14, 1941      BSA:          2.035 m Patient Age:    14 years       BP:           161/72 mmHg Patient Gender: M              HR:           74 bpm.  Exam Location:  Inpatient Procedure: Limited Echo, Limited Color Doppler and Cardiac Doppler Indications:    ACS/S/P TAVR 1 month  History:        Patient has prior history of Echocardiogram examinations, most                 recent 08/16/2019.                 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 Procedure Date:                 08/15/2019.  Sonographer:    Clayton Lefort RDCS (AE) Referring Phys: [id number] CARRIEL T NIPP IMPRESSIONS  1. Stable findings 1 month post TAVR, normal function of the bioprosthetic valve, mean transaortic gradient 12 mmHg, no paravalvular leak.  2. Left ventricular ejection fraction, by estimation, is 60 to 65%. The left ventricle has normal function. The left ventricle has no regional wall motion abnormalities. There is moderate concentric left ventricular hypertrophy. Left ventricular diastolic parameters are consistent with Grade I diastolic dysfunction (impaired relaxation). Elevated left atrial pressure.  3. Right ventricular systolic function is normal. The right ventricular size is normal.  4. The mitral valve is normal in structure. Trivial mitral valve regurgitation. No evidence of mitral stenosis.  5. The aortic valve has  been repaired/replaced. Aortic valve regurgitation is not visualized. No aortic stenosis is present. 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 Procedure Date: 08/15/2019. Echo findings  are consistent with normal structure and function of the aortic valve prosthesis.  6. The inferior vena cava is normal in size with greater than 50% respiratory variability, suggesting right atrial pressure of 3 mmHg. FINDINGS  Left Ventricle: Left ventricular ejection fraction, by estimation, is 60 to 65%. The left ventricle has normal function. The left ventricle has no regional wall motion abnormalities. The left ventricular internal cavity size was normal in size. 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 Elevated left atrial pressure. Right Ventricle: The right ventricular size is normal. No increase in right ventricular wall thickness. Right ventricular systolic function is normal. Left Atrium: Left atrial size was normal in size. Right Atrium: Right atrial size was normal in size. Pericardium: There is no evidence of pericardial effusion. Mitral Valve: The mitral valve is normal in structure. Normal mobility of the mitral valve leaflets. Trivial mitral valve regurgitation. No evidence of mitral valve stenosis. Tricuspid Valve: The tricuspid valve is normal in structure. Tricuspid valve regurgitation is mild . No evidence of tricuspid stenosis. Aortic Valve: The aortic valve has been repaired/replaced. Aortic valve regurgitation is not visualized. No aortic stenosis is present. Aortic valve mean gradient measures 12.0 mmHg. Aortic valve peak gradient measures 23.4 mmHg. Aortic valve area, by VTI measures 1.96 cm. 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 Procedure Date: 08/15/2019. Echo findings are consistent with normal structure and function of the aortic valve prosthesis. Pulmonic Valve: The pulmonic valve  was normal in structure. Pulmonic valve regurgitation is not visualized. No evidence of pulmonic stenosis. Aorta: The aortic root is normal in size and structure. Venous: The inferior vena cava is normal in size with greater than 50% respiratory variability, suggesting right atrial pressure of 3 mmHg. IAS/Shunts: No atrial level shunt detected by color flow  Doppler.  LEFT VENTRICLE PLAX 2D LVIDd:         3.80 cm  Diastology LVIDs:         3.40 cm  LV e' lateral:   6.90 cm/s LV PW:         1.70 cm  LV E/e' lateral: 8.9 LV IVS:        1.80 cm  LV e' medial:    4.20 cm/s LVOT diam:     2.60 cm  LV E/e' medial:  14.7 LV SV:         87 LV SV Index:   43 LVOT Area:     5.31 cm  IVC IVC diam: 1.60 cm LEFT ATRIUM         Index LA diam:    3.20 cm 1.57 cm/m  AORTIC VALVE AV Area (Vmax):    2.03 cm AV Area (Vmean):   2.11 cm AV Area (VTI):     1.96 cm AV Vmax:           242.00 cm/s AV Vmean:          159.000 cm/s AV VTI:            0.442 m AV Peak Grad:      23.4 mmHg AV Mean Grad:      12.0 mmHg LVOT Vmax:         92.70 cm/s LVOT Vmean:        63.100 cm/s LVOT VTI:          0.163 m LVOT/AV VTI ratio: 0.37  AORTA Ao Root diam: 3.40 cm Ao Asc diam:  3.10 cm MITRAL VALVE MV Area (PHT): 2.37 cm    SHUNTS MV Decel Time: 320 msec    Systemic VTI:  0.16 m MV E velocity: 61.70 cm/s  Systemic Diam: 2.60 cm MV A velocity: 88.30 cm/s MV E/A ratio:  0.70 Ena Dawley MD Electronically signed by Ena Dawley MD Signature Date/Time: 09/12/2019/10:42:31 AM    Final     Microbiology: Recent Results (from the past 240 hour(s))  SARS CORONAVIRUS 2 (TAT 6-24 HRS) Nasopharyngeal Nasopharyngeal Swab     Status: None   Collection Time: 09/25/19  5:36 PM   Specimen: Nasopharyngeal Swab  Result Value Ref Range Status   SARS Coronavirus 2 NEGATIVE NEGATIVE Final    Comment: (NOTE) SARS-CoV-2 target nucleic acids are NOT DETECTED.  The SARS-CoV-2 RNA is generally detectable in upper and lower respiratory specimens during the  acute phase of infection. Negative results do not preclude SARS-CoV-2 infection, do not rule out co-infections with other pathogens, and should not be used as the sole basis for treatment or other patient management decisions. Negative results must be combined with clinical observations, patient history, and epidemiological information. The expected result is Negative.  Fact Sheet for Patients: SugarRoll.be  Fact Sheet for Healthcare Providers: https://www.woods-mathews.com/  This test is not yet approved or cleared by the [place] FDA and  has been authorized for detection and/or diagnosis of SARS-CoV-2 by FDA under an Emergency Use Authorization (EUA). This EUA will remain  in effect (meaning this test can be used) for the duration of the COVID-19 declaration under Se ction 564(b)(1) of the Act, 21 U.S.C. section 360bbb-3(b)(1), unless the authorization is terminated or revoked sooner.  Performed at [facility], [place] [address], [place], [place] [address]   SARS Coronavirus 2 by RT PCR (hospital order, performed in [facility] hospital lab) Nasopharyngeal Nasopharyngeal Swab  Status: None   Collection Time: 10/01/19  2:32 AM   Specimen: Nasopharyngeal Swab  Result Value Ref Range Status   SARS Coronavirus 2 NEGATIVE NEGATIVE Final    Comment: (NOTE) SARS-CoV-2 target nucleic acids are NOT DETECTED.  The SARS-CoV-2 RNA is generally detectable in upper and lower respiratory specimens during the acute phase of infection. The lowest concentration of SARS-CoV-2 viral copies this assay can detect is 250 copies / mL. A negative result does not preclude SARS-CoV-2 infection and should not be used as the sole basis for treatment or other patient management decisions.  A negative result may occur with improper specimen collection / handling, submission of specimen other than nasopharyngeal swab, presence of viral mutation(s) within  the areas targeted by this assay, and inadequate number of viral copies (<250 copies / mL). A negative result must be combined with clinical observations, patient history, and epidemiological information.  Fact Sheet for Patients:   StrictlyIdeas.no  Fact Sheet for Healthcare Providers: BankingDealers.co.za  This test is not yet approved or  cleared by the [place] FDA and has been authorized for detection and/or diagnosis of SARS-CoV-2 by FDA under an Emergency Use Authorization (EUA).  This EUA will remain in effect (meaning this test can be used) for the duration of the COVID-19 declaration under Section 564(b)(1) of the Act, 21 U.S.C. section 360bbb-3(b)(1), unless the authorization is terminated or revoked sooner.  Performed at [facility], [place] [address] [place], [place] [address]      Labs: Basic Metabolic Panel: Recent Labs  Lab 10/01/19 0015 10/01/19 0429 10/02/19 0525  NA 135 135 136  K 3.9 4.1 3.8  CL 103 103 103  CO2 22 21* 25  GLUCOSE 218* 232* 194*  BUN 14 16 12   CREATININE 0.99 0.90 0.86  CALCIUM 9.8 9.7 9.3  MG  --   --  1.9  PHOS  --   --  2.9   Liver Function Tests: No results for input(s): AST, ALT, ALKPHOS, BILITOT, PROT, ALBUMIN in the last 168 hours. No results for input(s): LIPASE, AMYLASE in the last 168 hours. No results for input(s): AMMONIA in the last 168 hours. CBC: Recent Labs  Lab 10/01/19 0015 10/01/19 0429 10/02/19 0525  WBC 8.0 8.8 7.5  HGB 13.6 13.6 13.7  HCT 41.9 42.0 42.6  MCV 84.0 84.0 84.9  PLT 166 140* 130*   Cardiac Enzymes: No results for input(s): CKTOTAL, CKMB, CKMBINDEX, TROPONINI in the last 168 hours. BNP: BNP (last 3 results) Recent Labs    08/11/19 0844 09/08/19 2139 09/09/19 0308  BNP 238.5* 222.2* 326.2*    ProBNP (last 3 results) No results for input(s): PROBNP in the last 8760 hours.  CBG: Recent Labs  Lab 10/03/19 0606  10/03/19 1112 10/03/19 1724 10/03/19 2119 10/04/19 0617  GLUCAP 172*  172* 245* 204* 244* 201*       Signed:  Kayleen Memos, MD [facility] 10/04/2019, 10:40 AM

## 2019-10-04 NOTE — Plan of Care (Signed)
  Problem: Activity: Goal: Ability to tolerate increased activity will improve Outcome: Progressing   Problem: Cardiac: Goal: Ability to achieve and maintain adequate cardiovascular perfusion will improve Outcome: Progressing   Problem: Activity: Goal: Risk for activity intolerance will decrease Outcome: Progressing

## 2019-10-05 LAB — GLUCOSE, CAPILLARY
Glucose-Capillary: 164 mg/dL — ABNORMAL HIGH (ref 70–99)
Glucose-Capillary: 171 mg/dL — ABNORMAL HIGH (ref 70–99)
Glucose-Capillary: 208 mg/dL — ABNORMAL HIGH (ref 70–99)
Glucose-Capillary: 192 mg/dL — ABNORMAL HIGH (ref 70–99)
Glucose-Capillary: 210 mg/dL — ABNORMAL HIGH (ref 70–99)

## 2019-10-05 MED ORDER — NYSTATIN 100000 UNIT/GM EX POWD: Freq: Three times a day (TID) | CUTANEOUS | 0 refills | Status: AC

## 2019-10-05 NOTE — TOC Progression Note (Signed)
Transition of Care [facility]) - Progression Note    Patient Details  Name: Alexis Griffith MRN: [id number] Date of Birth: 06-Jun-1940  Transition of Care [facility]) CM/SW [place], [place] Phone Number: 10/05/2019, 10:13 AM  Clinical Narrative:     Insurance still pending- clinicals has been sent to Market researcher for review.  Thurmond Butts, MSW, [place] Clinical Social Worker   Expected Discharge Plan: Skilled Nursing Facility Barriers to Discharge: Continued Medical Work up  Expected Discharge Plan and Services Expected Discharge Plan: [place] In-house Referral: Clinical Social Work     Living arrangements for the past 2 months: Single Family Home Expected Discharge Date: 10/05/19                                     Social Determinants of Health (SDOH) Interventions    Readmission Risk Interventions No flowsheet data found.

## 2019-10-05 NOTE — Progress Notes (Addendum)
Physical Therapy Treatment Patient Details Name: Alexis Griffith MRN: [id number] DOB: Sep 09, 1940 Today's Date: 10/05/2019    History of Present Illness Alexis Griffith is a 79 y.o. 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  Pt was d/c to SNF after recent hospitalizatin and was still there. He presented to ED with chest pain. Pt admitted with chest pain with known multivessel CAD.    PT Comments    Pt was seen for gait and ck of sats, BP and pulses.  BP was checked for orthostatic values:  supine 167/63, sitting 157/59, standing 144/63.  Pulses were 75-88, progressing with greater effort to move.  Review of ex's on bed, then pt was encouraged to ask nursing to assist another walk if needed before transfer to Rehab.  See acutely for same with continued monitoring of vitals.  Follow Up Recommendations  SNF     Equipment Recommendations  None recommended by PT    Recommendations for Other Services OT consult     Precautions / Restrictions Precautions Precautions: Fall Precaution Comments: watch BP Restrictions Weight Bearing Restrictions: No RLE Weight Bearing: Weight bearing as tolerated    Mobility  Bed Mobility Overal bed mobility: Needs Assistance Bed Mobility: Supine to Sit;Sit to Supine     Supine to sit: Min guard Sit to supine: Min guard      Transfers Overall transfer level: Needs assistance Equipment used: Rolling walker (2 wheeled) Transfers: Sit to/from Stand Sit to Stand: Min guard         General transfer comment: cues for sequencing  Ambulation/Gait Ambulation/Gait assistance: Min guard Gait Distance (Feet): 200 Feet Assistive device: Rolling walker (2 wheeled) Gait Pattern/deviations: Step-through  pattern;Decreased stride length;Trunk flexed Gait velocity: decreased Gait velocity interpretation: <1.31 ft/sec, indicative of household ambulator General Gait Details: Pt appears to be pushing a bit to complete work   Marine scientist [place] (Stroke Patients Only)       Balance Overall balance assessment: Needs assistance Sitting-balance support: Feet supported;No upper extremity supported Sitting balance-Leahy Scale: Good Sitting balance - Comments: supervision   Standing balance support: During functional activity;No upper extremity supported Standing balance-Leahy Scale: Fair Standing balance comment: static stand without support                            Cognition Arousal/Alertness: Awake/alert Behavior During Therapy: WFL for tasks assessed/performed Overall Cognitive Status: Within Functional Limits for tasks assessed                                        Exercises General Exercises - Lower Extremity Ankle Circles/Pumps: AROM;5 reps Quad Sets: AROM;10 reps Heel Slides: AROM;10 reps Hip ABduction/ADduction: AROM;10 reps    General Comments General comments (skin integrity, edema, etc.): BP was checked for orthostatic values:  supine 167/63, sottomg 157/59, standing 144/63      Pertinent Vitals/Pain Pain Assessment: No/denies pain    Home Living                      Prior Function  PT Goals (current goals can now be found in the care plan section) Acute Rehab PT Goals Patient Stated Goal: to finish rehab and get home Progress towards PT goals: Progressing toward goals    Frequency    Min 2X/week      PT Plan Current plan remains appropriate    Co-evaluation              AM-PAC PT "6 Clicks" Mobility   Outcome Measure  Help needed turning from your back to your side while in a flat bed without using bedrails?: None Help needed moving from lying  on your back to sitting on the side of a flat bed without using bedrails?: None Help needed moving to and from a bed to a chair (including a wheelchair)?: A Little Help needed standing up from a chair using your arms (e.g., wheelchair or bedside chair)?: A Little Help needed to walk in hospital room?: A Little Help needed climbing 3-5 steps with a railing? : A Lot 6 Click Score: 19    End of Session Equipment Utilized During Treatment: Gait belt Activity Tolerance: Patient tolerated treatment well Patient left: with call bell/phone within reach;in bed;with bed alarm set Nurse Communication: Mobility status PT Visit Diagnosis: Other abnormalities of gait and mobility (R26.89);Muscle weakness (generalized) (M62.81);Other symptoms and signs involving the nervous system (R29.898)     Time: 8756-4332 PT Time Calculation (min) (ACUTE ONLY): 41 min  Charges:  $Gait Training: 8-22 mins $Therapeutic Exercise: 8-22 mins $Therapeutic Activity: 8-22 mins                   Ramond Dial 10/05/2019, 12:48 PM  Mee Hives, PT MS Acute Rehab Dept. Number: [place] and [place]

## 2019-10-05 NOTE — TOC Progression Note (Addendum)
Transition of Care [facility]) - Progression Note    Patient Details  Name: Alexis Griffith MRN: [id number] Date of Birth: [birth year]  Transition of Care [facility]) CM/SW [place], [place] Phone Number: 10/05/2019, 3:18 PM  Clinical Narrative:     CSW informed patient peer to peer was requested by his insurance   Thurmond Butts, MSW, [place] Worker   Expected Discharge Plan: [place] Barriers to Discharge: Continued Medical Work up  Expected Discharge Plan and Services Expected Discharge Plan: [place] In-house Referral: Clinical Social Work     Living arrangements for the past 2 months: Single Family Home Expected Discharge Date: 10/05/19                                     Social Determinants of Health (SDOH) Interventions    Readmission Risk Interventions No flowsheet data found.

## 2019-10-05 NOTE — Discharge Summary (Addendum)
Discharge Summary  Alexis Griffith ZYS:[id number] DOB: 1940/05/29  PCP: Haywood Pao, MD  Admit date: 09/30/2019 Discharge date: 10/05/2019  Time spent: 35 minutes  Recommendations for Outpatient Follow-up:  1. Follow-up with cardiology 2. Follow-up with your urologist 3. Follow-up with your primary care provider 4. Take your medications as described 5. Continue PT OT with assistance and fall precautions.  Recommendations per cardiology: Medication Recommendations:  Ranexa 500mg  BID, ASA 81mg  daily, fenofibrate 160mg  daily, Imdur 30mg  daily, Losartan 50mg  daily, Lopressor 50mg  BID and Crestor 20mg  daily.   Other recommendations (labs, testing, etc):  event monitor Follow up as an outpatient:  F/U with [facility]>>appt on 10/16/19  Discharge Diagnoses:  Active Hospital Problems   Diagnosis Date Noted  . Syncope   . Syncope and collapse 10/01/2019  . CHF (congestive heart failure) ([place]) 09/25/2019  . Orthostatic hypotension 09/11/2019  . Urinary retention 09/11/2019  . Chest pain   . 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 Diabetes mellitus type 2 in nonobese (HCC)   . Severe aortic stenosis   . Coronary artery disease involving native coronary artery of native heart with angina pectoris ([place]) 07/04/2019  . Carotid artery disease ([place]) 08/24/2011  . Labile hypertension     Resolved Hospital Problems  No resolved problems to display.    Discharge Condition: Stable  Diet recommendation: Heart healthy carb modified diet  Vitals:   10/05/19 1041 10/05/19 1113  BP:  (!) 167/63  Pulse: 73 78  Resp: 16 15  Temp: 98 F (36.7 C) 98.4 F (36.9 C)  SpO2:  98%    History of present illness:  Alexis Griffith a 79 y.o.malewith medical history significant ofCAD, stroke, AS s/p AVR, renal mass with concern for renal carcinoma, orthostatic hypotension and recurrent syncope, HTN, HLD, h/o GCA, DM2, HFpEF (TTE 09/10/19 showed normal EF with a  Grade 1 diastolic dysfunction and functional AVR), carotid artery disease, urinary retention with chronic foley and chronic anxiety who presents for chest pain. The patient reports that on Saturday (day of admission) patient developed a tightening up of his chest. It was similar feeling to his previous NSTEMI, but not as bad. About a 5/10. Pain did not radiate. He is currently at [facility] for rehab and he notes that he gets very nervous on the weekends as they have lower staff and he is concerned something will happen to him. He is on 4X per day anxiolytic at this time. He was brought to the ED and after triage was placed in the waiting room. He reports at that time having some issues with anxiety as well given he was surrounded by people and he was worried about how long it would take to be seen. He developed dizziness and then does not remember anything prior to being in his ED room. Per report from EDP, patient briefly lost consciousness and was reported that he lost a pulse, though this was for a brief time. He awoke upon being brought into triage. He has a history of recurrent syncope due to orthostatic hypotension and has at times been taken off many of his blood pressure medications. Further symptoms include discomfort from the foley catheter (has had to cancel follow up with urology 3X to do voiding trials) and some LLQ pain which he is not sure if coming from his bladder or bowels. He denies constipation. He denies heartburn, vomiting, diarrhea, fever, chills, recent illness.   Review of recent hospitalizations:  08/15/19 --> 08/31/19  TAVR on 08/15/19. Hospital course complicated by a peri procedural small infarct of the left medial frontal cortex, recurrent syncope due to orthostasis (only on metoprolol on discharge), acute urinary retention requiring coude catheterization and follow up with Urology, UTI and epididymoorchitis for which he was treated with fosfomycin and pulmonary nodules  found on PET scan.   09/08/19 --> 09/12/19 Presented for Chest pain, found to have NSTEMI. He was deemed not to be interventional at that time due to urinary bleeding requiring cessation of plavix previously, so plan was for medical treatment. Continue ASA, statin titration, anti-anginals including amlodipine and IMDUR. Foley was in place for urinary retention. He is noted to have renal cell carcinoma that is well known and following with Urology. Patient was discharged to [place] home.   09/19/19 --> 09/28/19 Admission for chest pain again. Troponins did not rise >100. He was managed with home medications. BP was labile with highs and lows. Noted to have issues with orthostasis. Discharged back to [facility].   Elevated troponin S peaked at 25.  Episode of chest pain the night of 10/01/19 and 10/02/19.  Seen by cardiology, ongoing cardiac meds adjustment.  10/03/19: No new complaints.  No acute events overnight. Seen by cardiology, ordered 30-day event monitor for further evaluation of syncope.  Signed off.  10/04/19: No acute events.  Awaiting bed placement.  Hospital Course:  Active Problems:   Labile hypertension   Carotid artery disease (HCC)   Coronary artery disease involving native coronary artery of native heart with angina pectoris (HCC)   Diabetes mellitus type 2 in nonobese (HCC)   Severe aortic stenosis   S/P TAVR (transcatheter aortic valve replacement)   Chest pain   Orthostatic hypotension   Urinary retention   CHF (congestive heart failure) (HCC)   Syncope and collapse   Syncope   Syncope, likely orthostatic in origin -Initially positive orthostatic vital signs -Cardiology will add thigh-high compression hose and will order event monitor as outpatient to evaluate for arrhythmias. -TAVR stable on 2D echo. -Follow-up with cardiology outpatient.  Essential hypertension -Medications adjusted by cardiology Recommendations as stated above.  Resolved Chest  pain, ruled out ACS Coronary artery disease involving native coronary artery of native heart with angina pectoris - He has had an extensive course of hospitalizations recently, worsened by anxiety.  Ranexa added by cardiology -Cardiac medications as noted above  Left renal mass with concern for malignancy Will need to keep his appointment follow up with urology Dr. Tresa Moore to discuss plan  Pulmonary nodules Will need follow up outpatient   Chronic anxiety Resume prior to admission regimen Follow up with your PCP  Resolved LLQ abdominal pain Abdominal x-ray with no acute findings.  Carotid artery disease (HCC) Right carotid artery stenosis, 40 to 59% on carotid ultrasound done on 08/17/2019. 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  Ambulatory dysfunction PT OT recommended SNF Continue PT  OT with assistance and fall precautions.  Skin candidiasis affecting groins Continue nystatin powder 3 times daily x5 days.    Code Status:Full  Discharge Exam: BP (!) 167/63 (BP Location: Left Arm)   Pulse 78   Temp 98.4 F (36.9 C) (Oral)   Resp 15   Ht 5\' 10"  (1.778 m)   Wt 81.1 kg   SpO2 98%   BMI 25.67 kg/m  . General: 79 y.o. year-old male well-developed well-nourished  . alert and interactive.   No acute distress.   . Cardiovascular: Regular rate and rhythm no rubs or gallops. Marland Kitchen Respiratory: Clear to auscultation no wheezes or rales.   . Abdomen: Soft nontender normal bowel sounds present.   . Musculoskeletal: No lower extremity edema bilaterally. Marland Kitchen Psychiatry: Mood is appropriate for condition and setting.  Discharge Instructions You were cared for by a hospitalist during your hospital stay. If you have any questions about your discharge medications or the care you received while you were in the hospital after you are discharged, you can call the unit and asked to speak with the hospitalist on call if the hospitalist that took care of you is not available. Once you are discharged, your primary care physician will handle any further medical issues. Please note that NO REFILLS for any discharge medications will be authorized once you are discharged, as it is imperative that you return to your primary care physician (or establish a relationship with a primary care physician if you do not have one) for your aftercare needs so that they can reassess your need for medications and monitor your lab values.   Allergies as of 10/05/2019      Reactions   Macrodantin [nitrofurantoin] Other (See Comments)   "blocked kidneys"    Tape Other (See Comments)   "Tears my skin and leaves marks." PLEASE USE COBAN!!   Sulfa Antibiotics Rash   Lisinopril Cough   Nutritional Supplements Other (See Comments)   Protein drink given to me "did something"   Penicillins Other (See Comments)   08/30/19- Patient can't remember, was 35+ years ago. OK with trying cephalexin in hospital      Medication List    STOP taking these medications   amLODipine 5 MG tablet Commonly known as: NORVASC   fosfomycin 3 g Pack Commonly known as: MONUROL   Trilipix 135 MG capsule Generic drug: Choline Fenofibrate Replaced by: fenofibrate 160 MG tablet     TAKE these medications   acetaminophen 325 MG tablet Commonly  known as: TYLENOL Take 650 mg by mouth every 6 (six) hours as needed for mild pain or moderate pain.   aspirin 81 MG chewable tablet Chew 1 tablet (81 mg total) by mouth daily.   carboxymethylcellulose 0.5 % Soln Commonly known as: REFRESH PLUS Place 1 drop into both eyes every 12 (twelve) hours as needed (dry eyes).   clonazePAM 0.5 MG tablet Commonly known as: KLONOPIN Take 1 tablet (0.5 mg total) by mouth 4 (four) times daily.   Coenzyme Q10 200 MG capsule Take 200 mg by mouth daily.   fenofibrate 160 MG tablet Take 1 tablet (160 mg total) by mouth daily. Replaces: Trilipix 135 MG capsule   isosorbide mononitrate 30 MG 24 hr tablet Commonly known as: IMDUR Take 1 tablet (30 mg total) by mouth daily. What changed:   medication strength  how much to take   losartan 50 MG tablet Commonly known as: COZAAR Take 1 tablet (50 mg total) by mouth daily.  magnesium oxide 400 (241.3 Mg) MG tablet Commonly known as: MAG-OX Take 1 tablet (400 mg total) by mouth daily.   metoprolol tartrate 50 MG tablet Commonly known as: LOPRESSOR Take 1 tablet (50 mg total) by mouth 2 (two) times daily.   nitroGLYCERIN 0.4 MG SL tablet Commonly known as: NITROSTAT Place 1 tablet (0.4 mg total) under the tongue every 5 (five) minutes as needed for chest pain (up to 3 doses). Do not give if blood pressure is low (less than 601 systolic). What changed:   when to take this  reasons to take this  additional instructions   NovoLOG FlexPen 100 UNIT/ML FlexPen Generic drug: insulin aspart Inject 1-9 Units into the skin See admin instructions. Inject as per sliding scale: If 70-120= 0 units; 121-150= 1 unit; 151-200= 2 units; 201-250= 3 units; 251-300= 5 units; 301-350= 7 units; 351-400= 9 units GREATER THAN 400 CALL MD, subcutaneously before meals for DM What changed: Another medication with the same name was removed. Continue taking this medication, and follow the directions you see here.    nystatin powder Commonly known as: MYCOSTATIN/NYSTOP Apply topically 3 (three) times daily for 5 days. To groins   glucose blood test strip 1 each by Other route as needed.   ONE TOUCH ULTRA TEST test strip Generic drug: glucose blood 1 each by Other route daily.   OneTouch Delica Plus [id number] Misc 1 each by Other route daily.   polyethylene glycol 17 g packet Commonly known as: MIRALAX / GLYCOLAX Take 17 g by mouth daily as needed for mild constipation.   ranolazine 500 MG 12 hr tablet Commonly known as: RANEXA Take 1 tablet (500 mg total) by mouth 2 (two) times daily.   rosuvastatin 20 MG tablet Commonly known as: CRESTOR Take 1 tablet (20 mg total) by mouth daily. What changed: when to take this      Allergies  Allergen Reactions  . Macrodantin [Nitrofurantoin] Other (See Comments)    "blocked kidneys"   . Tape Other (See Comments)    "Tears my skin and leaves marks." PLEASE USE COBAN!!  . Sulfa Antibiotics Rash  . Lisinopril Cough  . Nutritional Supplements Other (See Comments)    Protein drink given to me "did something"  . Penicillins Other (See Comments)    08/30/19- Patient can't remember, was 35+ years ago. OK with trying cephalexin in hospital    Follow-up Information    Tisovec, Fransico Him, MD. Call in 1 day(s).   Specialty: Internal Medicine Why: PLease call for a post hospital follow up appointment Contact information: [place] [address] [phone]        Deboraha Sprang, MD .   Specialty: Cardiology Contact information: [address] [place] [address] [phone]        Eileen Stanford, PA-C Follow up.   Specialties: Cardiology, Radiology Why: Follow-up scheduled for 10/16/2019 at 2:30pm. Please arrive 15 minutes early for check-in. Contact information: [address] [address] [phone]        Alexis Frock, MD. Call in 1 day(s).   Specialty: Urology  Why: Please call for a post hospital follow up appointment Contact information: [place] [address] [phone]                The results of significant diagnostics from this hospitalization (including imaging, microbiology, ancillary and laboratory) are listed below for reference.    Significant Diagnostic Studies: DG  Chest 2 View  Result Date: 10/01/2019 CLINICAL DATA:  Chest pain EXAM: CHEST - 2 VIEW COMPARISON:  September 19, 2019 FINDINGS: There is mild cardiomegaly. Aortic valve stent is noted. Aortic knob calcifications are seen. Both lungs are clear. No pleural effusion. No acute osseous abnormality is seen. IMPRESSION: No active cardiopulmonary disease. Electronically Signed   By: Prudencio Pair M.D.   On: 10/01/2019 00:49   DG Chest 2 View  Result Date: 09/08/2019 CLINICAL DATA:  Chest pain and shortness of breath. EXAM: CHEST - 2 VIEW COMPARISON:  August 11, 2019 FINDINGS: There is no evidence of acute infiltrate, pleural effusion or pneumothorax. A stable subcentimeter calcified nodular opacity is seen overlying the upper left lung. The heart size and mediastinal contours are within normal limits. An artificial aortic valve is seen. This represents a new finding when compared to the prior study. The visualized skeletal structures are unremarkable. IMPRESSION: No active cardiopulmonary disease. Electronically Signed   By: Virgina Norfolk M.D.   On: 09/08/2019 22:16   DG Chest Port 1 View  Result Date: 09/19/2019 CLINICAL DATA:  79 year old male with chest pain. EXAM: PORTABLE CHEST 1 VIEW COMPARISON:  Chest radiograph dated 09/08/2019. FINDINGS: There are bibasilar linear atelectasis/scarring. No focal consolidation, pleural effusion, or pneumothorax. Probable background of mild emphysema. There is mild cardiomegaly. Coronary vascular calcification and aortic valve repair. Atherosclerotic calcification of the aortic arch. No acute osseous pathology. IMPRESSION: 1. No  acute cardiopulmonary process. 2. Mild cardiomegaly. Electronically Signed   By: Anner Crete M.D.   On: 09/19/2019 15:16   DG Abd 2 Views  Result Date: 10/01/2019 CLINICAL DATA:  Abdominal pain EXAM: ABDOMEN - 2 VIEW COMPARISON:  CT, 08/24/2019. FINDINGS: Normal bowel gas pattern. Scattered aortoiliac atherosclerotic calcifications. No evidence of renal or ureteral stones. Soft tissues otherwise unremarkable. No acute skeletal abnormality. IMPRESSION: 1. No acute findings.  Normal bowel gas pattern. Electronically Signed   By: Lajean Manes M.D.   On: 10/01/2019 05:14   ECHOCARDIOGRAM COMPLETE  Result Date: 10/02/2019    ECHOCARDIOGRAM REPORT   Patient Name:   Alexis Griffith Date of Exam: 10/02/2019 Medical Rec #:  [id number]      Height:       70.0 in Accession #:    [id number]     Weight:       181.9 lb Date of Birth:  [birth year]      BSA:          2.005 m Patient Age:    64 years       BP:           131/57 mmHg Patient Gender: M              HR:           65 bpm. Exam Location:  Inpatient Procedure: 2D Echo, 3D Echo and Strain Analysis                                MODIFIED REPORT:    This report was modified by Cherlynn Kaiser MD on 10/02/2019 due to report                                    revision.  Indications:     Chest Pain 786.50 / R07.9  Syncope 780.2 / R55  History:         Patient has prior history of Echocardiogram examinations, most                  recent 09/12/2019. CHF, CAD and Previous Myocardial Infarction,                  Carotid Disease, Arrythmias:PVC, Signs/Symptoms:Chest Pain and                  Hypotension; Risk Factors:Sleep Apnea.                  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 Procedure Date:                  08/15/2019.  Sonographer:     Darlina Sicilian RDCS Referring Phys:  [id number] Darreld Mclean Diagnosing Phys: Cherlynn Kaiser MD IMPRESSIONS  1. Left ventricular ejection  fraction, by estimation, is 60 to 65%. The left ventricle has normal function. The left ventricle has no regional wall motion abnormalities. There is moderate asymmetric left ventricular hypertrophy of the septal segment. Left ventricular diastolic parameters are consistent with Grade I diastolic dysfunction (impaired relaxation).  2. Right ventricular systolic function is normal. The right ventricular size is normal.  3. The mitral valve is normal in structure. Trivial mitral valve regurgitation. No evidence of mitral stenosis.  4. The aortic valve has been repaired/replaced. Aortic valve regurgitation No paravalvular or valvular regurgitation. 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 Procedure Date: 08/15/2019. Echo findings are consistent with normal structure and function of the aortic valve prosthesis. Aortic valve mean gradient measures 13.0 mmHg.  5. The inferior vena cava is normal in size with greater than 50% respiratory variability, suggesting right atrial pressure of 3 mmHg. Comparison(s): Stable prosthetic aortic valve systolic gradient compared to 09/12/19. FINDINGS  Left Ventricle: Left ventricular ejection fraction, by estimation, is 60 to 65%. The left ventricle has normal function. The left ventricle has no regional wall motion abnormalities. The left ventricular internal cavity size was normal in size. There is  moderate asymmetric left ventricular hypertrophy of the septal segment. Left ventricular diastolic parameters are consistent with Grade I diastolic dysfunction (impaired relaxation). Right Ventricle: The right ventricular size is normal. No increase in right ventricular wall thickness. Right ventricular systolic function is normal. Left Atrium: Left atrial size was normal in size. Right Atrium: Right atrial size was normal in size. Pericardium: There is no evidence of pericardial effusion. Mitral Valve: The mitral valve is normal in structure.  Normal mobility of the mitral valve leaflets. Trivial mitral valve regurgitation. No evidence of mitral valve stenosis. Tricuspid Valve: The tricuspid valve is normal in structure. Tricuspid valve regurgitation is trivial. No evidence of tricuspid stenosis. Aortic Valve: The aortic valve has been repaired/replaced. Aortic valve regurgitation No paravalvular or valvular regurgitation. Aortic valve mean gradient measures 13.0 mmHg. Aortic valve peak gradient measures 23.3 mmHg. Aortic valve area, by VTI measures 1.79 cm. 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 Procedure Date: 08/15/2019. Echo findings are consistent with normal structure and function of the aortic valve prosthesis. Pulmonic Valve: The pulmonic valve was normal in structure. Pulmonic valve regurgitation is trivial. No evidence of  pulmonic stenosis. Aorta: The aortic root is normal in size and structure. Venous: The inferior vena cava is normal in size with greater than 50% respiratory variability, suggesting right atrial pressure of 3 mmHg. IAS/Shunts: No atrial level shunt detected by color flow Doppler.  LEFT VENTRICLE PLAX 2D LVIDd:         5.10 cm      Diastology LVIDs:         3.20 cm      LV e' lateral:   7.13 cm/s LV PW:         0.90 cm      LV E/e' lateral: 10.9 LV IVS:        1.40 cm      LV e' medial:    4.80 cm/s LVOT diam:     2.50 cm      LV E/e' medial:  16.2 LV SV:         87 LV SV Index:   43 LVOT Area:     4.91 cm  LV Volumes (MOD) LV vol d, MOD A2C: 134.0 ml LV vol d, MOD A4C: 114.0 ml LV vol s, MOD A2C: 54.1 ml LV vol s, MOD A4C: 59.2 ml LV SV MOD A2C:     79.9 ml LV SV MOD A4C:     114.0 ml LV SV MOD BP:      69.3 ml RIGHT VENTRICLE RV S prime:     16.80 cm/s TAPSE (M-mode): 2.5 cm LEFT ATRIUM             Index       RIGHT ATRIUM           Index LA diam:        3.80 cm 1.90 cm/m  RA Area:     12.20 cm LA Vol (A2C):   43.8 ml 21.85 ml/m RA Volume:   23.40 ml  11.67 ml/m LA  Vol (A4C):   41.1 ml 20.50 ml/m LA Biplane Vol: 45.1 ml 22.50 ml/m  AORTIC VALVE AV Area (Vmax):    1.75 cm AV Area (Vmean):   1.74 cm AV Area (VTI):     1.79 cm AV Vmax:           241.50 cm/s AV Vmean:          167.750 cm/s AV VTI:            0.484 m AV Peak Grad:      23.3 mmHg AV Mean Grad:      13.0 mmHg LVOT Vmax:         86.20 cm/s LVOT Vmean:        59.300 cm/s LVOT VTI:          0.177 m LVOT/AV VTI ratio: 0.37  AORTA Ao Asc diam: 3.40 cm MITRAL VALVE MV Area (PHT): 2.29 cm    SHUNTS MV Decel Time: 331 msec    Systemic VTI:  0.18 m MV E velocity: 77.60 cm/s  Systemic Diam: 2.50 cm MV A velocity: 90.80 cm/s MV E/A ratio:  0.85 Cherlynn Kaiser MD Electronically signed by Cherlynn Kaiser MD Signature Date/Time: 10/02/2019/7:46:50 PM    Final (Updated)    ECHOCARDIOGRAM LIMITED  Result Date: 09/12/2019    ECHOCARDIOGRAM LIMITED REPORT   Patient Name:   Alexis Griffith Date of Exam: 09/10/2019 Medical Rec #:  [id number]      Height:       70.0 in Accession #:    [id number]  Weight:       188.5 lb Date of Birth:  07/17/1940      BSA:          2.035 m Patient Age:    6 years       BP:           161/72 mmHg Patient Gender: M              HR:           74 bpm. Exam Location:  Inpatient Procedure: Limited Echo, Limited Color Doppler and Cardiac Doppler Indications:    ACS/S/P TAVR 1 month  History:        Patient has prior history of Echocardiogram examinations, most                 recent 08/16/2019.                 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 Procedure Date:                 08/15/2019.  Sonographer:    Clayton Lefort RDCS (AE) Referring Phys: [id number] CARRIEL T NIPP IMPRESSIONS  1. Stable findings 1 month post TAVR, normal function of the bioprosthetic valve, mean transaortic gradient 12 mmHg, no paravalvular leak.  2. Left ventricular ejection fraction, by estimation, is 60 to 65%. The left ventricle has normal function. The  left ventricle has no regional wall motion abnormalities. There is moderate concentric left ventricular hypertrophy. Left ventricular diastolic parameters are consistent with Grade I diastolic dysfunction (impaired relaxation). Elevated left atrial pressure.  3. Right ventricular systolic function is normal. The right ventricular size is normal.  4. The mitral valve is normal in structure. Trivial mitral valve regurgitation. No evidence of mitral stenosis.  5. The aortic valve has been repaired/replaced. Aortic valve regurgitation is not visualized. No aortic stenosis is present. 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 Procedure Date: 08/15/2019. Echo findings  are consistent with normal structure and function of the aortic valve prosthesis.  6. The inferior vena cava is normal in size with greater than 50% respiratory variability, suggesting right atrial pressure of 3 mmHg. FINDINGS  Left Ventricle: Left ventricular ejection fraction, by estimation, is 60 to 65%. The left ventricle has normal function. The left ventricle has no regional wall motion abnormalities. The left ventricular internal cavity size was normal in size. 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 Elevated left atrial pressure. Right Ventricle: The right ventricular size is normal. No increase in right ventricular wall thickness. Right ventricular systolic function is normal. Left Atrium: Left atrial size was normal in size. Right Atrium: Right atrial size was normal in size. Pericardium: There is no evidence of pericardial effusion. Mitral Valve: The mitral valve is normal in structure. Normal mobility of the mitral valve leaflets. Trivial mitral valve regurgitation. No evidence of mitral valve stenosis. Tricuspid Valve: The tricuspid valve is normal in structure. Tricuspid valve regurgitation is mild . No evidence of tricuspid stenosis. Aortic Valve: The aortic valve has been  repaired/replaced. Aortic valve regurgitation is not visualized. No aortic stenosis is present. Aortic valve mean gradient measures 12.0 mmHg. Aortic valve peak gradient measures 23.4 mmHg. Aortic valve area, by VTI measures 1.96 cm. 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 Procedure Date: 08/15/2019. Echo findings are consistent with normal structure and function of the aortic valve prosthesis. Pulmonic Valve: The pulmonic valve was normal in structure. Pulmonic valve regurgitation is not visualized. No evidence of pulmonic stenosis. Aorta: The aortic root is normal in size and structure. Venous: The inferior vena cava is normal in size with greater than 50% respiratory variability, suggesting right atrial pressure of 3 mmHg. IAS/Shunts: No atrial level shunt detected by color flow Doppler.  LEFT VENTRICLE PLAX 2D LVIDd:         3.80 cm  Diastology LVIDs:         3.40 cm  LV e' lateral:   6.90 cm/s LV PW:         1.70 cm  LV E/e' lateral: 8.9 LV IVS:        1.80 cm  LV e' medial:    4.20 cm/s LVOT diam:     2.60 cm  LV E/e' medial:  14.7 LV SV:         87 LV SV Index:   43 LVOT Area:     5.31 cm  IVC IVC diam: 1.60 cm LEFT ATRIUM         Index LA diam:    3.20 cm 1.57 cm/m  AORTIC VALVE AV Area (Vmax):    2.03 cm AV Area (Vmean):   2.11 cm AV Area (VTI):     1.96 cm AV Vmax:           242.00 cm/s AV Vmean:          159.000 cm/s AV VTI:            0.442 m AV Peak Grad:      23.4 mmHg AV Mean Grad:      12.0 mmHg LVOT Vmax:         92.70 cm/s LVOT Vmean:        63.100 cm/s LVOT VTI:          0.163 m LVOT/AV VTI ratio: 0.37  AORTA Ao Root diam: 3.40 cm Ao Asc diam:  3.10 cm MITRAL VALVE MV Area (PHT): 2.37 cm    SHUNTS MV Decel Time: 320 msec    Systemic VTI:  0.16 m MV E velocity: 61.70 cm/s  Systemic Diam: 2.60 cm MV A velocity: 88.30 cm/s MV E/A ratio:  0.70 Ena Dawley MD Electronically signed by Ena Dawley MD Signature Date/Time:  09/12/2019/10:42:31 AM    Final     Microbiology: Recent Results (from the past 240 hour(s))  SARS CORONAVIRUS 2 (TAT 6-24 HRS) Nasopharyngeal Nasopharyngeal Swab     Status: None   Collection Time: 09/25/19  5:36 PM   Specimen: Nasopharyngeal Swab  Result Value Ref Range Status   SARS Coronavirus 2 NEGATIVE NEGATIVE Final    Comment: (NOTE) SARS-CoV-2 target nucleic acids are NOT DETECTED.  The SARS-CoV-2 RNA is generally detectable in upper and lower respiratory specimens during the acute phase of infection. Negative results do not preclude SARS-CoV-2 infection, do not rule out co-infections with other pathogens, and should not be used as the sole basis for treatment or other patient management decisions. Negative results must be combined with clinical observations, patient history, and epidemiological information. The expected result is Negative.  Fact Sheet for Patients: SugarRoll.be  Fact Sheet for Healthcare Providers: https://www.woods-mathews.com/  This test is not yet approved or cleared by the [place] FDA and  has been authorized for detection and/or  diagnosis of SARS-CoV-2 by FDA under an Emergency Use Authorization (EUA). This EUA will remain  in effect (meaning this test can be used) for the duration of the COVID-19 declaration under Se ction 564(b)(1) of the Act, 21 U.S.C. section 360bbb-3(b)(1), unless the authorization is terminated or revoked sooner.  Performed at [facility], [place] [address], [place], [place] [address]   SARS Coronavirus 2 by RT PCR (hospital order, performed in [facility] hospital lab) Nasopharyngeal Nasopharyngeal Swab     Status: None   Collection Time: 10/01/19  2:32 AM   Specimen: Nasopharyngeal Swab  Result Value Ref Range Status   SARS Coronavirus 2 NEGATIVE NEGATIVE Final    Comment: (NOTE) SARS-CoV-2 target nucleic acids are NOT DETECTED.  The SARS-CoV-2 RNA is generally  detectable in upper and lower respiratory specimens during the acute phase of infection. The lowest concentration of SARS-CoV-2 viral copies this assay can detect is 250 copies / mL. A negative result does not preclude SARS-CoV-2 infection and should not be used as the sole basis for treatment or other patient management decisions.  A negative result may occur with improper specimen collection / handling, submission of specimen other than nasopharyngeal swab, presence of viral mutation(s) within the areas targeted by this assay, and inadequate number of viral copies (<250 copies / mL). A negative result must be combined with clinical observations, patient history, and epidemiological information.  Fact Sheet for Patients:   StrictlyIdeas.no  Fact Sheet for Healthcare Providers: BankingDealers.co.za  This test is not yet approved or  cleared by the [place] FDA and has been authorized for detection and/or diagnosis of SARS-CoV-2 by FDA under an Emergency Use Authorization (EUA).  This EUA will remain in effect (meaning this test can be used) for the duration of the COVID-19 declaration under Section 564(b)(1) of the Act, 21 U.S.C. section 360bbb-3(b)(1), unless the authorization is terminated or revoked sooner.  Performed at [facility], [place] [address] [place], [place] [address]      Labs: Basic Metabolic Panel: Recent Labs  Lab 10/01/19 0015 10/01/19 0429 10/02/19 0525  NA 135 135 136  K 3.9 4.1 3.8  CL 103 103 103  CO2 22 21* 25  GLUCOSE 218* 232* 194*  BUN 14 16 12   CREATININE 0.99 0.90 0.86  CALCIUM 9.8 9.7 9.3  MG  --   --  1.9  PHOS  --   --  2.9   Liver Function Tests: No results for input(s): AST, ALT, ALKPHOS, BILITOT, PROT, ALBUMIN in the last 168 hours. No results for input(s): LIPASE, AMYLASE in the last 168 hours. No results for input(s): AMMONIA in the last 168 hours. CBC: Recent Labs  Lab  10/01/19 0015 10/01/19 0429 10/02/19 0525  WBC 8.0 8.8 7.5  HGB 13.6 13.6 13.7  HCT 41.9 42.0 42.6  MCV 84.0 84.0 84.9  PLT 166 140* 130*   Cardiac Enzymes: No results for input(s): CKTOTAL, CKMB, CKMBINDEX, TROPONINI in the last 168 hours. BNP: BNP (last 3 results) Recent Labs    08/11/19 0844 09/08/19 2139 09/09/19 0308  BNP 238.5* 222.2* 326.2*    ProBNP (last 3 results) No results for input(s): PROBNP in the last 8760 hours.  CBG: Recent Labs  Lab 10/04/19 1613 10/04/19 2115 10/05/19 0230 10/05/19 0611 10/05/19 1110  GLUCAP 135* 217* 164* 171* 208*       Signed:  Kayleen Memos, MD [facility] 10/05/2019, 12:08 PM

## 2019-10-05 NOTE — Progress Notes (Signed)
Occupational Therapy Treatment Patient Details Name: Alexis Griffith MRN: [id number] DOB: 07-02-1940 Today's Date: 10/05/2019    History of present illness Alexis Griffith is a 79 y.o. 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  Pt was d/c to SNF after recent hospitalizatin and was still there. He presented to ED with chest pain. Pt admitted with chest pain with known multivessel CAD.   OT comments  Patient continues to make steady progress towards goals in skilled OT session. Patient's session encompassed functional mobility in the hallway and ADL lower body dressing task as pt was motivated to don scrub pants (problem solving to feed catheter through and needed min A) and then ambulate. Pt reports no dizziness upon standing or ambulating, and able to walk functional household distances with no rest break needed. BP upon returning was noted to be 141/53 however pt remains asymptomatic. Discharge remains appropriate at this time, will continue to follow acutely.    Follow Up Recommendations  SNF;Supervision/Assistance - 24 hour    Equipment Recommendations  Other (comment) (RW)    Recommendations for Other Services      Precautions / Restrictions Precautions Precautions: Fall Precaution Comments: watch BP       Mobility Bed Mobility Overal bed mobility: Needs Assistance Bed Mobility: Supine to Sit     Supine to sit: Modified independent (Device/Increase time)        Transfers Overall transfer level: Needs assistance Equipment used: Rolling walker (2 wheeled) Transfers: Sit to/from Stand Sit to Stand: Supervision         General transfer comment: cues for sequencing    Balance Overall balance assessment: Needs  assistance Sitting-balance support: Feet supported;No upper extremity supported Sitting balance-Leahy Scale: Good Sitting balance - Comments: supervision   Standing balance support: During functional activity;No upper extremity supported Standing balance-Leahy Scale: Fair Standing balance comment: static stand without support                           ADL either performed or assessed with clinical judgement   ADL Overall ADL's : Needs assistance/impaired                     Lower Body Dressing: Minimal assistance;Sit to/from stand;Sitting/lateral leans Lower Body Dressing Details (indicate cue type and reason): Donning scrub pants with catheter             Functional mobility during ADLs: Supervision/safety;Rolling walker General ADL Comments: Session focus on functional mobility in order to increase activity tolerance     Vision       Perception     Praxis      Cognition Arousal/Alertness: Awake/alert Behavior During Therapy: WFL for tasks assessed/performed Overall Cognitive Status: Within Functional Limits for tasks assessed                                          Exercises     Shoulder Instructions       General Comments      Pertinent Vitals/ Pain       Pain Assessment: No/denies pain  Home Living  Prior Functioning/Environment              Frequency  Min 2X/week        Progress Toward Goals  OT Goals(current goals can now be found in the care plan section)  Progress towards OT goals: Progressing toward goals  Acute Rehab OT Goals Patient Stated Goal: return to [facility] to get stronger OT Goal Formulation: With patient Time For Goal Achievement: 10/17/19 Potential to Achieve Goals: Good  Plan Discharge plan remains appropriate    Co-evaluation                 AM-PAC OT "6 Clicks" Daily Activity     Outcome Measure   Help from  another person eating meals?: None Help from another person taking care of personal grooming?: A Little Help from another person toileting, which includes using toliet, bedpan, or urinal?: A Little Help from another person bathing (including washing, rinsing, drying)?: A Little Help from another person to put on and taking off regular upper body clothing?: A Little Help from another person to put on and taking off regular lower body clothing?: A Little 6 Click Score: 19    End of Session Equipment Utilized During Treatment: Gait belt;Rolling walker  OT Visit Diagnosis: Unsteadiness on feet (R26.81);Muscle weakness (generalized) (M62.81)   Activity Tolerance Patient tolerated treatment well   Patient Left in chair;with call bell/phone within reach;with chair alarm set   Nurse Communication Mobility status;Precautions        Time: 6825-7493 OT Time Calculation (min): 21 min  Charges: OT General Charges $OT Visit: 1 Visit OT Treatments $Self Care/Home Management : 8-22 mins  Corinne Ports E. Felicie Kocher, COTA/L Acute Rehabilitation Services [place] 10/05/2019, 10:40 AM

## 2019-10-06 LAB — GLUCOSE, CAPILLARY
Glucose-Capillary: 166 mg/dL — ABNORMAL HIGH (ref 70–99)
Glucose-Capillary: 246 mg/dL — ABNORMAL HIGH (ref 70–99)
Glucose-Capillary: 268 mg/dL — ABNORMAL HIGH (ref 70–99)
Glucose-Capillary: 140 mg/dL — ABNORMAL HIGH (ref 70–99)

## 2019-10-06 NOTE — Progress Notes (Signed)
Physical Therapy Treatment Patient Details Name: Alexis Griffith MRN: [id number] DOB: Dec 27, 1940 Today's Date: 10/06/2019    History of Present Illness Alexis Griffith is a 79 y.o. 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  Pt was d/c to SNF after recent hospitalizatin and was still there. He presented to ED with chest pain. Pt admitted with chest pain with known multivessel CAD.    PT Comments    Patient progressing well towards PT goals. Reports some abdominal discomfort but otherwise feeling well. Motivated to get back to rehab so he can walk without a RW and be able to care for self independently. Requires Min guard for safety with gait and use of the RW. VSS on RA. Pt with a slow gait speed putting pt at increased risk for falls. Encouraged walking a few times per day over the weekend. Will follow.   Follow Up Recommendations  SNF     Equipment Recommendations  None recommended by PT    Recommendations for Other Services       Precautions / Restrictions Precautions Precautions: Fall Precaution Comments: watch BP Restrictions Weight Bearing Restrictions: No    Mobility  Bed Mobility Overal bed mobility: Needs Assistance Bed Mobility: Sit to Supine       Sit to supine: Supervision;HOB elevated   General bed mobility comments: No assist needed.  Transfers Overall transfer level: Needs assistance Equipment used: Rolling walker (2 wheeled) Transfers: Sit to/from Stand Sit to Stand: Supervision         General transfer comment: Supervision for safety. Stood from toilet x1.  Ambulation/Gait Ambulation/Gait assistance: Min guard Gait Distance (Feet): 200 Feet Assistive device: Rolling walker (2 wheeled) Gait  Pattern/deviations: Step-through pattern;Decreased stride length;Trunk flexed Gait velocity: .98 ft/sec Gait velocity interpretation: <1.31 ft/sec, indicative of household ambulator General Gait Details: Slow, mostly steady gait with flexed trunk. Fatigues. VSS on RA.   Stairs             Wheelchair Mobility    Modified Rankin (Stroke Patients Only) Modified Rankin (Stroke Patients Only) Pre-Morbid Rankin Score: No significant disability Modified Rankin: Moderately severe disability     Balance Overall balance assessment: Needs assistance Sitting-balance support: Feet supported;No upper extremity supported Sitting balance-Leahy Scale: Good Sitting balance - Comments: supervision   Standing balance support: During functional activity Standing balance-Leahy Scale: Fair Standing balance comment: Needs UE support for dynamic tasks.                            Cognition Arousal/Alertness: Awake/alert Behavior During Therapy: WFL for tasks assessed/performed Overall Cognitive Status: Within Functional Limits for tasks assessed                                 General Comments: Pt up moving around in room, "they do not know i am."      Exercises      General Comments General comments (skin integrity, edema, etc.): No dizziness with activity.      Pertinent Vitals/Pain Pain Assessment: Faces Faces Pain Scale: Hurts a little bit Pain Location: abdomen Pain Descriptors / Indicators: Aching Pain Intervention(s): Monitored during session;Repositioned    Home Living  Prior Function            PT Goals (current goals can now be found in the care plan section) Acute Rehab PT Goals Patient Stated Goal: get back to independence with walking, finish rehab and go home Progress towards PT goals: Progressing toward goals    Frequency    Min 2X/week      PT Plan Current plan remains appropriate     Co-evaluation              AM-PAC PT "6 Clicks" Mobility   Outcome Measure  Help needed turning from your back to your side while in a flat bed without using bedrails?: None Help needed moving from lying on your back to sitting on the side of a flat bed without using bedrails?: None Help needed moving to and from a bed to a chair (including a wheelchair)?: None Help needed standing up from a chair using your arms (e.g., wheelchair or bedside chair)?: None Help needed to walk in hospital room?: A Little Help needed climbing 3-5 steps with a railing? : A Lot 6 Click Score: 3    End of Session Equipment Utilized During Treatment: Gait belt Activity Tolerance: Patient tolerated treatment well Patient left: in bed;with call bell/phone within reach Nurse Communication: Mobility status PT Visit Diagnosis: Other abnormalities of gait and mobility (R26.89);Muscle weakness (generalized) (M62.81);Other symptoms and signs involving the nervous system (R29.898)     Time: 1511-1530 PT Time Calculation (min) (ACUTE ONLY): 19 min  Charges:  $Gait Training: 8-22 mins                     [name], PT, DPT Acute Rehabilitation Services Pager [phone] Office [place] 10/06/2019, 3:46 PM

## 2019-10-06 NOTE — TOC Progression Note (Addendum)
Transition of Care [facility]) - Progression Note    Patient Details  Name: Alexis Griffith MRN: [id number] Date of Birth: [birth year]  Transition of Care [facility]) CM/SW [place], [place] Phone Number: 10/06/2019, 1:02 PM  Clinical Narrative:    3:20p Spouse [name] was informed insurance declined SNF authorization and an expedited appeal is needed. CSW met with patient and his wife on speaker phone, patient requested spouse complete appeal to try to have SNF authorized.  [name] agreed to complete expedited appeal today.  1:00p CSW reached out to [employer] to inquire on insurance authorization, awaiting a call back.   CSW spoke with patient and provided an update. Patient expressed he could not return home due to his wife being elderly and currently caring for his daughter. Patient stated they have looked into possibly going to SNF under private pay. Patient provided permission for his wife [name] to be contacted.  CSW reached out to patient's spouse [name] to discuss the discharge plan. Patient's spouse expressed she looked into private pay and is unsure if that is the plan. She requested follow-up from [place] once insurance provides an update. CSW expressed understanding and agreed to follow-up.   Expected Discharge Plan: [place] Barriers to Discharge: Continued Medical Work up  Expected Discharge Plan and Services Expected Discharge Plan: [place] In-house Referral: Clinical Social Work     Living arrangements for the past 2 months: Single Family Home Expected Discharge Date: 10/06/19                                     Social Determinants of Health (SDOH) Interventions    Readmission Risk Interventions No flowsheet data found.

## 2019-10-06 NOTE — Care Management Important Message (Signed)
Important Message  Patient Details  Name: Alexis Griffith MRN: [id number] Date of Birth: 02/08/1941   Medicare Important Message Given:  Yes     [name] 10/06/2019, 8:45 AM

## 2019-10-06 NOTE — Progress Notes (Signed)
Patient has concern about SNF disposition. Would like to speak to LSW. Relayed to LSW who was rounding on another patient's room. LSW made aware.

## 2019-10-06 NOTE — Discharge Summary (Signed)
Discharge Summary  Alexis Griffith FEX:[id number] DOB: [birth year]  PCP: Haywood Pao, MD  Admit date: 09/30/2019 Discharge date: 10/06/2019  Time spent: 35 minutes  Recommendations for Outpatient Follow-up:  1. Follow-up with cardiology 2. Follow-up with your urologist 3. Follow-up with your primary care provider 4. Take your medications as described 5. Continue PT OT with assistance and fall precautions.  Recommendations per cardiology: Medication Recommendations:  Ranexa 500mg  BID, ASA 81mg  daily, fenofibrate 160mg  daily, Imdur 30mg  daily, Losartan 50mg  daily, Lopressor 50mg  BID and Crestor 20mg  daily.   Other recommendations (labs, testing, etc):  event monitor Follow up as an outpatient:  F/U with [facility]>>appt on 10/16/19  Discharge Diagnoses:  Active Hospital Problems   Diagnosis Date Noted  . Syncope   . Syncope and collapse 10/01/2019  . CHF (congestive heart failure) ([place]) 09/25/2019  . Orthostatic hypotension 09/11/2019  . Urinary retention 09/11/2019  . Chest pain   . 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 Diabetes mellitus type 2 in nonobese (HCC)   . Severe aortic stenosis   . Coronary artery disease involving native coronary artery of native heart with angina pectoris ([place]) 07/04/2019  . Carotid artery disease ([place]) 08/24/2011  . Labile hypertension     Resolved Hospital Problems  No resolved problems to display.    Discharge Condition: Stable  Diet recommendation: Heart healthy carb modified diet  Vitals:   10/06/19 0843 10/06/19 1122  BP: (!) 149/48 137/65  Pulse: 78 66  Resp: 18 18  Temp: 97.7 F (36.5 C) 97.7 F (36.5 C)  SpO2: 98% 94%    History of present illness:  Alexis Griffith a 78 y.o.malewith medical history significant ofCAD, stroke, AS s/p AVR, renal mass with concern for renal carcinoma, orthostatic hypotension and recurrent syncope, HTN, HLD, h/o GCA, DM2, HFpEF (TTE 09/10/19 showed normal  EF with a Grade 1 diastolic dysfunction and functional AVR), carotid artery disease, urinary retention with chronic foley and chronic anxiety who presents for chest pain. The patient reports that on Saturday (day of admission) patient developed a tightening up of his chest. It was similar feeling to his previous NSTEMI, but not as bad. About a 5/10. Pain did not radiate. He is currently at [facility] for rehab and he notes that he gets very nervous on the weekends as they have lower staff and he is concerned something will happen to him. He is on 4X per day anxiolytic at this time. He was brought to the ED and after triage was placed in the waiting room. He reports at that time having some issues with anxiety as well given he was surrounded by people and he was worried about how long it would take to be seen. He developed dizziness and then does not remember anything prior to being in his ED room. Per report from EDP, patient briefly lost consciousness and was reported that he lost a pulse, though this was for a brief time. He awoke upon being brought into triage. He has a history of recurrent syncope due to orthostatic hypotension and has at times been taken off many of his blood pressure medications. Further symptoms include discomfort from the foley catheter (has had to cancel follow up with urology 3X to do voiding trials) and some LLQ pain which he is not sure if coming from his bladder or bowels. He denies constipation. He denies heartburn, vomiting, diarrhea, fever, chills, recent illness.   Review of recent hospitalizations:  08/15/19 --> 08/31/19  TAVR on 08/15/19. Hospital course complicated by a peri procedural small infarct of the left medial frontal cortex, recurrent syncope due to orthostasis (only on metoprolol on discharge), acute urinary retention requiring coude catheterization and follow up with Urology, UTI and epididymoorchitis for which he was treated with fosfomycin and  pulmonary nodules found on PET scan.   09/08/19 --> 09/12/19 Presented for Chest pain, found to have NSTEMI. He was deemed not to be interventional at that time due to urinary bleeding requiring cessation of plavix previously, so plan was for medical treatment. Continue ASA, statin titration, anti-anginals including amlodipine and IMDUR. Foley was in place for urinary retention. He is noted to have renal cell carcinoma that is well known and following with Urology. Patient was discharged to [place] home.   09/19/19 --> 09/28/19 Admission for chest pain again. Troponins did not rise >100. He was managed with home medications. BP was labile with highs and lows. Noted to have issues with orthostasis. Discharged back to [facility].   Elevated troponin S peaked at 25.  Episode of chest pain the night of 10/01/19 and 10/02/19.  Seen by cardiology, ongoing cardiac meds adjustment.  10/03/19: Seen by cardiology, ordered 30-day event monitor for further evaluation of syncope.  Signed off.  10/06/19: No acute events.  Awaiting insurance authorization.  Hospital Course:  Active Problems:   Labile hypertension   Carotid artery disease (HCC)   Coronary artery disease involving native coronary artery of native heart with angina pectoris (HCC)   Diabetes mellitus type 2 in nonobese (HCC)   Severe aortic stenosis   S/P TAVR (transcatheter aortic valve replacement)   Chest pain   Orthostatic hypotension   Urinary retention   CHF (congestive heart failure) (HCC)   Syncope and collapse   Syncope   Syncope, likely orthostatic in origin -Initially positive orthostatic vital signs -Cardiology will add thigh-high compression hose and will order event monitor as outpatient to evaluate for arrhythmias. -TAVR stable on 2D echo. -Follow-up with cardiology outpatient.  Essential hypertension -Medications adjusted by cardiology Recommendations as stated above.  Resolved Chest pain, ruled out  ACS Coronary artery disease involving native coronary artery of native heart with angina pectoris - He has had an extensive course of hospitalizations recently, worsened by anxiety.  Ranexa added by cardiology -Cardiac medications as noted above  Left renal mass with concern for malignancy Will need to keep his appointment follow up with urology Dr. Tresa Moore to discuss plan  Pulmonary nodules Will need follow up outpatient   Chronic anxiety Resume prior to admission regimen Follow up with your PCP  Resolved LLQ abdominal pain Abdominal x-ray with no acute findings.  Carotid artery disease (HCC) Right carotid artery stenosis, 40 to 59% on carotid ultrasound done on 08/17/2019. 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  Ambulatory dysfunction PT OT recommended SNF Continue PT OT with assistance and fall precautions.  Skin  candidiasis affecting groins Continue nystatin powder 3 times daily x5 days.    Code Status:Full  Discharge Exam: BP 137/65 (BP Location: Right Arm)   Pulse 66   Temp 97.7 F (36.5 C) (Oral)   Resp 18   Ht 5\' 10"  (1.778 m)   Wt 81.1 kg   SpO2 94%   BMI 25.67 kg/m  . General: 79 y.o. year-old male well-developed well-nourished  . alert and interactive.  No acute distress.    . Cardiovascular: Regular rate and rhythm no rubs or gallops. Marland Kitchen Respiratory: Clear to auscultation no wheezes or rales.   . Abdomen: Soft nontender normal bowel sounds present.   . Musculoskeletal: No lower extremity edema bilaterally. Marland Kitchen Psychiatry: Mood is appropriate for condition and setting.  Discharge Instructions You were cared for by a hospitalist during your hospital stay. If you have any questions about your discharge medications or the care you received while you were in the hospital after you are discharged, you can call the unit and asked to speak with the hospitalist on call if the hospitalist that took care of you is not available. Once you are discharged, your primary care physician will handle any further medical issues. Please note that NO REFILLS for any discharge medications will be authorized once you are discharged, as it is imperative that you return to your primary care physician (or establish a relationship with a primary care physician if you do not have one) for your aftercare needs so that they can reassess your need for medications and monitor your lab values.   Allergies as of 10/06/2019      Reactions   Macrodantin [nitrofurantoin] Other (See Comments)   "blocked kidneys"    Tape Other (See Comments)   "Tears my skin and leaves marks." PLEASE USE COBAN!!   Sulfa Antibiotics Rash   Lisinopril Cough   Nutritional Supplements Other (See Comments)   Protein drink given to me "did something"   Penicillins Other (See Comments)   08/30/19- Patient can't remember, was 35+ years ago. OK with trying cephalexin in hospital      Medication List    STOP taking these medications   amLODipine 5 MG tablet Commonly known as: NORVASC   fosfomycin 3 g Pack Commonly known as: MONUROL   Trilipix 135 MG capsule Generic drug: Choline Fenofibrate Replaced by: fenofibrate 160 MG tablet     TAKE these medications   acetaminophen 325 MG tablet Commonly known as:  TYLENOL Take 650 mg by mouth every 6 (six) hours as needed for mild pain or moderate pain.   aspirin 81 MG chewable tablet Chew 1 tablet (81 mg total) by mouth daily.   carboxymethylcellulose 0.5 % Soln Commonly known as: REFRESH PLUS Place 1 drop into both eyes every 12 (twelve) hours as needed (dry eyes).   clonazePAM 0.5 MG tablet Commonly known as: KLONOPIN Take 1 tablet (0.5 mg total) by mouth 4 (four) times daily.   Coenzyme Q10 200 MG capsule Take 200 mg by mouth daily.   fenofibrate 160 MG tablet Take 1 tablet (160 mg total) by mouth daily. Replaces: Trilipix 135 MG capsule   isosorbide mononitrate 30 MG 24 hr tablet Commonly known as: IMDUR Take 1 tablet (30 mg total) by mouth daily. What changed:   medication strength  how much to take   losartan 50 MG tablet Commonly known as: COZAAR Take 1 tablet (50 mg total) by mouth daily.   magnesium oxide 400 (241.3 Mg) MG tablet Commonly known  as: MAG-OX Take 1 tablet (400 mg total) by mouth daily.   metoprolol tartrate 50 MG tablet Commonly known as: LOPRESSOR Take 1 tablet (50 mg total) by mouth 2 (two) times daily.   nitroGLYCERIN 0.4 MG SL tablet Commonly known as: NITROSTAT Place 1 tablet (0.4 mg total) under the tongue every 5 (five) minutes as needed for chest pain (up to 3 doses). Do not give if blood pressure is low (less than 735 systolic). What changed:   when to take this  reasons to take this  additional instructions   NovoLOG FlexPen 100 UNIT/ML FlexPen Generic drug: insulin aspart Inject 1-9 Units into the skin See admin instructions. Inject as per sliding scale: If 70-120= 0 units; 121-150= 1 unit; 151-200= 2 units; 201-250= 3 units; 251-300= 5 units; 301-350= 7 units; 351-400= 9 units GREATER THAN 400 CALL MD, subcutaneously before meals for DM What changed: Another medication with the same name was removed. Continue taking this medication, and follow the directions you see here.   nystatin  powder Commonly known as: MYCOSTATIN/NYSTOP Apply topically 3 (three) times daily for 5 days. To groins   glucose blood test strip 1 each by Other route as needed.   ONE TOUCH ULTRA TEST test strip Generic drug: glucose blood 1 each by Other route daily.   OneTouch Delica Plus [id number] Misc 1 each by Other route daily.   polyethylene glycol 17 g packet Commonly known as: MIRALAX / GLYCOLAX Take 17 g by mouth daily as needed for mild constipation.   ranolazine 500 MG 12 hr tablet Commonly known as: RANEXA Take 1 tablet (500 mg total) by mouth 2 (two) times daily.   rosuvastatin 20 MG tablet Commonly known as: CRESTOR Take 1 tablet (20 mg total) by mouth daily. What changed: when to take this      Allergies  Allergen Reactions  . Macrodantin [Nitrofurantoin] Other (See Comments)    "blocked kidneys"   . Tape Other (See Comments)    "Tears my skin and leaves marks." PLEASE USE COBAN!!  . Sulfa Antibiotics Rash  . Lisinopril Cough  . Nutritional Supplements Other (See Comments)    Protein drink given to me "did something"  . Penicillins Other (See Comments)    08/30/19- Patient can't remember, was 35+ years ago. OK with trying cephalexin in hospital    Follow-up Information    Tisovec, Fransico Him, MD. Call in 1 day(s).   Specialty: Internal Medicine Why: PLease call for a post hospital follow up appointment Contact information: [place] [address] [phone]        Deboraha Sprang, MD .   Specialty: Cardiology Contact information: [address] [address] [phone]        Eileen Stanford, PA-C Follow up.   Specialties: Cardiology, Radiology Why: Follow-up scheduled for 10/16/2019 at 2:30pm. Please arrive 15 minutes early for check-in. Contact information: [address] [place] [address] [phone]        Alexis Frock, MD. Call in 1 day(s).   Specialty: Urology Why:  Please call for a post hospital follow up appointment Contact information: [place] [address] [phone]                The results of significant diagnostics from this hospitalization (including imaging, microbiology, ancillary and laboratory) are listed below for reference.    Significant Diagnostic Studies: DG Chest 2 View  Result Date: 10/01/2019 CLINICAL DATA:  Chest pain EXAM: CHEST - 2 VIEW COMPARISON:  September 19, 2019 FINDINGS: There is mild cardiomegaly. Aortic valve stent is noted. Aortic knob calcifications are seen. Both lungs are clear. No pleural effusion. No acute osseous abnormality is seen. IMPRESSION: No active cardiopulmonary disease. Electronically Signed   By: Prudencio Pair M.D.   On: 10/01/2019 00:49   DG Chest 2 View  Result Date: 09/08/2019 CLINICAL DATA:  Chest pain and shortness of breath. EXAM: CHEST - 2 VIEW COMPARISON:  August 11, 2019 FINDINGS: There is no evidence of acute infiltrate, pleural effusion or pneumothorax. A stable subcentimeter calcified nodular opacity is seen overlying the upper left lung. The heart size and mediastinal contours are within normal limits. An artificial aortic valve is seen. This represents a new finding when compared to the prior study. The visualized skeletal structures are unremarkable. IMPRESSION: No active cardiopulmonary disease. Electronically Signed   By: Virgina Norfolk M.D.   On: 09/08/2019 22:16   DG Chest Port 1 View  Result Date: 09/19/2019 CLINICAL DATA:  79 year old male with chest pain. EXAM: PORTABLE CHEST 1 VIEW COMPARISON:  Chest radiograph dated 09/08/2019. FINDINGS: There are bibasilar linear atelectasis/scarring. No focal consolidation, pleural effusion, or pneumothorax. Probable background of mild emphysema. There is mild cardiomegaly. Coronary vascular calcification and aortic valve repair. Atherosclerotic calcification of the aortic arch. No acute osseous pathology. IMPRESSION: 1. No acute  cardiopulmonary process. 2. Mild cardiomegaly. Electronically Signed   By: Anner Crete M.D.   On: 09/19/2019 15:16   DG Abd 2 Views  Result Date: 10/01/2019 CLINICAL DATA:  Abdominal pain EXAM: ABDOMEN - 2 VIEW COMPARISON:  CT, 08/24/2019. FINDINGS: Normal bowel gas pattern. Scattered aortoiliac atherosclerotic calcifications. No evidence of renal or ureteral stones. Soft tissues otherwise unremarkable. No acute skeletal abnormality. IMPRESSION: 1. No acute findings.  Normal bowel gas pattern. Electronically Signed   By: Lajean Manes M.D.   On: 10/01/2019 05:14   ECHOCARDIOGRAM COMPLETE  Result Date: 10/02/2019    ECHOCARDIOGRAM REPORT   Patient Name:   Alexis Griffith Date of Exam: 10/02/2019 Medical Rec #:  [id number]      Height:       70.0 in Accession #:    [id number]     Weight:       181.9 lb Date of Birth:  24-Mar-1940      BSA:          2.005 m Patient Age:    5 years       BP:           131/57 mmHg Patient Gender: M              HR:           65 bpm. Exam Location:  Inpatient Procedure: 2D Echo, 3D Echo and Strain Analysis                                MODIFIED REPORT:    This report was modified by Cherlynn Kaiser MD on 10/02/2019 due to report                                    revision.  Indications:     Chest Pain 786.50 / R07.9                  Syncope 780.2 / R55  History:         Patient has prior history of Echocardiogram examinations, most                  recent 09/12/2019. CHF, CAD and Previous Myocardial Infarction,                  Carotid Disease, Arrythmias:PVC, Signs/Symptoms:Chest Pain and                  Hypotension; Risk Factors:Sleep Apnea.                  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 Procedure Date:                  08/15/2019.  Sonographer:     Darlina Sicilian RDCS Referring Phys:  [id number] Darreld Mclean Diagnosing Phys: Cherlynn Kaiser MD IMPRESSIONS  1. Left ventricular ejection  fraction, by estimation, is 60 to 65%. The left ventricle has normal function. The left ventricle has no regional wall motion abnormalities. There is moderate asymmetric left ventricular hypertrophy of the septal segment. Left ventricular diastolic parameters are consistent with Grade I diastolic dysfunction (impaired relaxation).  2. Right ventricular systolic function is normal. The right ventricular size is normal.  3. The mitral valve is normal in structure. Trivial mitral valve regurgitation. No evidence of mitral stenosis.  4. The aortic valve has been repaired/replaced. Aortic valve regurgitation No paravalvular or valvular regurgitation. 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 Procedure Date: 08/15/2019. Echo findings are consistent with normal structure and function of the aortic valve prosthesis. Aortic valve mean gradient measures 13.0 mmHg.  5. The inferior vena cava is normal in size with greater than 50% respiratory variability, suggesting right atrial pressure of 3 mmHg. Comparison(s): Stable prosthetic aortic valve systolic gradient compared to 09/12/19. FINDINGS  Left Ventricle: Left ventricular ejection fraction, by estimation, is 60 to 65%. The left ventricle has normal function. The left ventricle has no regional wall motion abnormalities. The left ventricular internal cavity size was normal in size. There is  moderate asymmetric left ventricular hypertrophy of the septal segment. Left ventricular diastolic parameters are consistent with Grade I diastolic dysfunction (impaired relaxation). Right Ventricle: The right ventricular size is normal. No increase in right ventricular wall thickness. Right ventricular systolic function is normal. Left Atrium: Left atrial size was normal in size. Right Atrium: Right atrial size was normal in size. Pericardium: There is no evidence of pericardial effusion. Mitral Valve: The mitral valve is normal in structure.  Normal mobility of the mitral valve leaflets. Trivial mitral valve regurgitation. No evidence of mitral valve stenosis. Tricuspid Valve: The tricuspid valve is normal in structure. Tricuspid valve regurgitation is trivial. No evidence of tricuspid stenosis. Aortic Valve: The aortic valve has been repaired/replaced. Aortic valve regurgitation No paravalvular or valvular regurgitation. Aortic valve mean gradient measures 13.0 mmHg. Aortic valve peak gradient measures 23.3 mmHg. Aortic valve area, by VTI measures 1.79 cm. 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 Procedure Date: 08/15/2019. Echo findings are consistent with normal structure and function of the aortic valve prosthesis. Pulmonic Valve: The pulmonic valve was normal in structure. Pulmonic valve regurgitation is trivial. No evidence of pulmonic stenosis. Aorta: The aortic  root is normal in size and structure. Venous: The inferior vena cava is normal in size with greater than 50% respiratory variability, suggesting right atrial pressure of 3 mmHg. IAS/Shunts: No atrial level shunt detected by color flow Doppler.  LEFT VENTRICLE PLAX 2D LVIDd:         5.10 cm      Diastology LVIDs:         3.20 cm      LV e' lateral:   7.13 cm/s LV PW:         0.90 cm      LV E/e' lateral: 10.9 LV IVS:        1.40 cm      LV e' medial:    4.80 cm/s LVOT diam:     2.50 cm      LV E/e' medial:  16.2 LV SV:         87 LV SV Index:   43 LVOT Area:     4.91 cm  LV Volumes (MOD) LV vol d, MOD A2C: 134.0 ml LV vol d, MOD A4C: 114.0 ml LV vol s, MOD A2C: 54.1 ml LV vol s, MOD A4C: 59.2 ml LV SV MOD A2C:     79.9 ml LV SV MOD A4C:     114.0 ml LV SV MOD BP:      69.3 ml RIGHT VENTRICLE RV S prime:     16.80 cm/s TAPSE (M-mode): 2.5 cm LEFT ATRIUM             Index       RIGHT ATRIUM           Index LA diam:        3.80 cm 1.90 cm/m  RA Area:     12.20 cm LA Vol (A2C):   43.8 ml 21.85 ml/m RA Volume:   23.40 ml  11.67 ml/m LA  Vol (A4C):   41.1 ml 20.50 ml/m LA Biplane Vol: 45.1 ml 22.50 ml/m  AORTIC VALVE AV Area (Vmax):    1.75 cm AV Area (Vmean):   1.74 cm AV Area (VTI):     1.79 cm AV Vmax:           241.50 cm/s AV Vmean:          167.750 cm/s AV VTI:            0.484 m AV Peak Grad:      23.3 mmHg AV Mean Grad:      13.0 mmHg LVOT Vmax:         86.20 cm/s LVOT Vmean:        59.300 cm/s LVOT VTI:          0.177 m LVOT/AV VTI ratio: 0.37  AORTA Ao Asc diam: 3.40 cm MITRAL VALVE MV Area (PHT): 2.29 cm    SHUNTS MV Decel Time: 331 msec    Systemic VTI:  0.18 m MV E velocity: 77.60 cm/s  Systemic Diam: 2.50 cm MV A velocity: 90.80 cm/s MV E/A ratio:  0.85 Cherlynn Kaiser MD Electronically signed by Cherlynn Kaiser MD Signature Date/Time: 10/02/2019/7:46:50 PM    Final (Updated)    ECHOCARDIOGRAM LIMITED  Result Date: 09/12/2019    ECHOCARDIOGRAM LIMITED REPORT   Patient Name:   Alexis Griffith Date of Exam: 09/10/2019 Medical Rec #:  [id number]      Height:       70.0 in Accession #:    [id number]     Weight:  188.5 lb Date of Birth:  05/24/1940      BSA:          2.035 m Patient Age:    68 years       BP:           161/72 mmHg Patient Gender: M              HR:           74 bpm. Exam Location:  Inpatient Procedure: Limited Echo, Limited Color Doppler and Cardiac Doppler Indications:    ACS/S/P TAVR 1 month  History:        Patient has prior history of Echocardiogram examinations, most                 recent 08/16/2019.                 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 Procedure Date:                 08/15/2019.  Sonographer:    Clayton Lefort RDCS (AE) Referring Phys: [id number] CARRIEL T NIPP IMPRESSIONS  1. Stable findings 1 month post TAVR, normal function of the bioprosthetic valve, mean transaortic gradient 12 mmHg, no paravalvular leak.  2. Left ventricular ejection fraction, by estimation, is 60 to 65%. The left ventricle has normal function. The  left ventricle has no regional wall motion abnormalities. There is moderate concentric left ventricular hypertrophy. Left ventricular diastolic parameters are consistent with Grade I diastolic dysfunction (impaired relaxation). Elevated left atrial pressure.  3. Right ventricular systolic function is normal. The right ventricular size is normal.  4. The mitral valve is normal in structure. Trivial mitral valve regurgitation. No evidence of mitral stenosis.  5. The aortic valve has been repaired/replaced. Aortic valve regurgitation is not visualized. No aortic stenosis is present. 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 Procedure Date: 08/15/2019. Echo findings  are consistent with normal structure and function of the aortic valve prosthesis.  6. The inferior vena cava is normal in size with greater than 50% respiratory variability, suggesting right atrial pressure of 3 mmHg. FINDINGS  Left Ventricle: Left ventricular ejection fraction, by estimation, is 60 to 65%. The left ventricle has normal function. The left ventricle has no regional wall motion abnormalities. The left ventricular internal cavity size was normal in size. 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 Elevated left atrial pressure. Right Ventricle: The right ventricular size is normal. No increase in right ventricular wall thickness. Right ventricular systolic function is normal. Left Atrium: Left atrial size was normal in size. Right Atrium: Right atrial size was normal in size. Pericardium: There is no evidence of pericardial effusion. Mitral Valve: The mitral valve is normal in structure. Normal mobility of the mitral valve leaflets. Trivial mitral valve regurgitation. No evidence of mitral valve stenosis. Tricuspid Valve: The tricuspid valve is normal in structure. Tricuspid valve regurgitation is mild . No evidence of tricuspid stenosis. Aortic Valve: The aortic valve has been  repaired/replaced. Aortic valve regurgitation is not visualized. No aortic stenosis is present. Aortic valve mean gradient measures 12.0 mmHg. Aortic valve peak gradient measures 23.4 mmHg. Aortic valve area, by VTI measures 1.96 cm. 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 Procedure Date: 08/15/2019. Echo findings are consistent with normal structure and function of the aortic valve prosthesis. Pulmonic Valve: The pulmonic valve was normal in structure. Pulmonic valve regurgitation is not visualized. No evidence of pulmonic stenosis. Aorta: The aortic root is normal in size and structure. Venous: The inferior vena cava is normal in size with greater than 50% respiratory variability, suggesting right atrial pressure of 3 mmHg. IAS/Shunts: No atrial level shunt detected by color flow Doppler.  LEFT VENTRICLE PLAX 2D LVIDd:         3.80 cm  Diastology LVIDs:         3.40 cm  LV e' lateral:   6.90 cm/s LV PW:         1.70 cm  LV E/e' lateral: 8.9 LV IVS:        1.80 cm  LV e' medial:    4.20 cm/s LVOT diam:     2.60 cm  LV E/e' medial:  14.7 LV SV:         87 LV SV Index:   43 LVOT Area:     5.31 cm  IVC IVC diam: 1.60 cm LEFT ATRIUM         Index LA diam:    3.20 cm 1.57 cm/m  AORTIC VALVE AV Area (Vmax):    2.03 cm AV Area (Vmean):   2.11 cm AV Area (VTI):     1.96 cm AV Vmax:           242.00 cm/s AV Vmean:          159.000 cm/s AV VTI:            0.442 m AV Peak Grad:      23.4 mmHg AV Mean Grad:      12.0 mmHg LVOT Vmax:         92.70 cm/s LVOT Vmean:        63.100 cm/s LVOT VTI:          0.163 m LVOT/AV VTI ratio: 0.37  AORTA Ao Root diam: 3.40 cm Ao Asc diam:  3.10 cm MITRAL VALVE MV Area (PHT): 2.37 cm    SHUNTS MV Decel Time: 320 msec    Systemic VTI:  0.16 m MV E velocity: 61.70 cm/s  Systemic Diam: 2.60 cm MV A velocity: 88.30 cm/s MV E/A ratio:  0.70 Ena Dawley MD Electronically signed by Ena Dawley MD Signature Date/Time:  09/12/2019/10:42:31 AM    Final     Microbiology: Recent Results (from the past 240 hour(s))  SARS Coronavirus 2 by RT PCR (hospital order, performed in [facility] hospital lab) Nasopharyngeal Nasopharyngeal Swab     Status: None   Collection Time: 10/01/19  2:32 AM   Specimen: Nasopharyngeal Swab  Result Value Ref Range Status   SARS Coronavirus 2 NEGATIVE NEGATIVE Final    Comment: (NOTE) SARS-CoV-2 target nucleic acids are NOT DETECTED.  The SARS-CoV-2 RNA is generally detectable in upper and lower respiratory specimens during the acute phase of infection. The lowest concentration of SARS-CoV-2 viral copies this assay can detect is 250 copies / mL. A negative result does not preclude SARS-CoV-2 infection and should not be used as the sole basis for treatment or other patient management decisions.  A negative result may occur with improper specimen collection / handling, submission of specimen other than nasopharyngeal swab, presence of viral mutation(s) within the areas targeted by this assay, and inadequate number of viral copies (<250 copies / mL). A negative result  must be combined with clinical observations, patient history, and epidemiological information.  Fact Sheet for Patients:   StrictlyIdeas.no  Fact Sheet for Healthcare Providers: BankingDealers.co.za  This test is not yet approved or  cleared by the [place] FDA and has been authorized for detection and/or diagnosis of SARS-CoV-2 by FDA under an Emergency Use Authorization (EUA).  This EUA will remain in effect (meaning this test can be used) for the duration of the COVID-19 declaration under Section 564(b)(1) of the Act, 21 U.S.C. section 360bbb-3(b)(1), unless the authorization is terminated or revoked sooner.  Performed at [facility], [place] [address], [place], [place] [address]      Labs: Basic Metabolic Panel: Recent Labs  Lab 10/01/19 0015  10/01/19 0429 10/02/19 0525  NA 135 135 136  K 3.9 4.1 3.8  CL 103 103 103  CO2 22 21* 25  GLUCOSE 218* 232* 194*  BUN 14 16 12   CREATININE 0.99 0.90 0.86  CALCIUM 9.8 9.7 9.3  MG  --   --  1.9  PHOS  --   --  2.9   Liver Function Tests: No results for input(s): AST, ALT, ALKPHOS, BILITOT, PROT, ALBUMIN in the last 168 hours. No results for input(s): LIPASE, AMYLASE in the last 168 hours. No results for input(s): AMMONIA in the last 168 hours. CBC: Recent Labs  Lab 10/01/19 0015 10/01/19 0429 10/02/19 0525  WBC 8.0 8.8 7.5  HGB 13.6 13.6 13.7  HCT 41.9 42.0 42.6  MCV 84.0 84.0 84.9  PLT 166 140* 130*   Cardiac Enzymes: No results for input(s): CKTOTAL, CKMB, CKMBINDEX, TROPONINI in the last 168 hours. BNP: BNP (last 3 results) Recent Labs    08/11/19 0844 09/08/19 2139 09/09/19 0308  BNP 238.5* 222.2* 326.2*    ProBNP (last 3 results) No results for input(s): PROBNP in the last 8760 hours.  CBG: Recent Labs  Lab 10/05/19 1110 10/05/19 1624 10/05/19 2140 10/06/19 0634 10/06/19 1120  GLUCAP 208* 192* 210* 166* 246*       Signed:  Kayleen Memos, MD [facility] 10/06/2019, 1:02 PM

## 2019-10-07 LAB — GLUCOSE, CAPILLARY
Glucose-Capillary: 160 mg/dL — ABNORMAL HIGH (ref 70–99)
Glucose-Capillary: 174 mg/dL — ABNORMAL HIGH (ref 70–99)
Glucose-Capillary: 191 mg/dL — ABNORMAL HIGH (ref 70–99)
Glucose-Capillary: 218 mg/dL — ABNORMAL HIGH (ref 70–99)

## 2019-10-07 NOTE — Progress Notes (Signed)
Discharge Summary  Alexis Griffith VQQ:[id number] DOB: 02-Feb-1941  PCP: Haywood Pao, MD  Admit date: 09/30/2019 Discharge date: 10/07/2019  Time spent: 35 minutes  Recommendations for Outpatient Follow-up:  1. Follow-up with cardiology 2. Follow-up with your urologist 3. Follow-up with your primary care provider 4. Take your medications as described 5. Continue PT OT with assistance and fall precautions.  Recommendations per cardiology: Medication Recommendations:  Ranexa 500mg  BID, ASA 81mg  daily, fenofibrate 160mg  daily, Imdur 30mg  daily, Losartan 50mg  daily, Lopressor 50mg  BID and Crestor 20mg  daily.   Other recommendations (labs, testing, etc):  event monitor Follow up as an outpatient:  F/U with [facility]>>appt on 10/16/19  Discharge Diagnoses:  Active Hospital Problems   Diagnosis Date Noted  . Syncope   . Syncope and collapse 10/01/2019  . CHF (congestive heart failure) ([place]) 09/25/2019  . Orthostatic hypotension 09/11/2019  . Urinary retention 09/11/2019  . Chest pain   . 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 Diabetes mellitus type 2 in nonobese (HCC)   . Severe aortic stenosis   . Coronary artery disease involving native coronary artery of native heart with angina pectoris ([place]) 07/04/2019  . Carotid artery disease ([place]) 08/24/2011  . Labile hypertension     Resolved Hospital Problems  No resolved problems to display.    Discharge Condition: Stable  Diet recommendation: Heart healthy carb modified diet  Vitals:   10/07/19 0441 10/07/19 0811  BP: (!) 133/45 (!) 132/50  Pulse: 66 71  Resp: 16 16  Temp: 98 F (36.7 C) 98.4 F (36.9 C)  SpO2: 95% 94%    History of present illness:  Alexis Griffith a 79 y.o.malewith medical history significant ofCAD, stroke, AS s/p AVR, renal mass with concern for renal carcinoma, orthostatic hypotension and recurrent syncope, HTN, HLD, h/o GCA, DM2, HFpEF (TTE 09/10/19 showed  normal EF with a Grade 1 diastolic dysfunction and functional AVR), carotid artery disease, urinary retention with chronic foley and chronic anxiety who presents for chest pain. The patient reports that on Saturday (day of admission) patient developed a tightening up of his chest. It was similar feeling to his previous NSTEMI, but not as bad. About a 5/10. Pain did not radiate. He is currently at [facility] for rehab and he notes that he gets very nervous on the weekends as they have lower staff and he is concerned something will happen to him. He is on 4X per day anxiolytic at this time. He was brought to the ED and after triage was placed in the waiting room. He reports at that time having some issues with anxiety as well given he was surrounded by people and he was worried about how long it would take to be seen. He developed dizziness and then does not remember anything prior to being in his ED room. Per report from EDP, patient briefly lost consciousness and was reported that he lost a pulse, though this was for a brief time. He awoke upon being brought into triage. He has a history of recurrent syncope due to orthostatic hypotension and has at times been taken off many of his blood pressure medications. Further symptoms include discomfort from the foley catheter (has had to cancel follow up with urology 3X to do voiding trials) and some LLQ pain which he is not sure if coming from his bladder or bowels. He denies constipation. He denies heartburn, vomiting, diarrhea, fever, chills, recent illness.   Review of recent hospitalizations:  08/15/19 -->  08/31/19 TAVR on 08/15/19. Hospital course complicated by a peri procedural small infarct of the left medial frontal cortex, recurrent syncope due to orthostasis (only on metoprolol on discharge), acute urinary retention requiring coude catheterization and follow up with Urology, UTI and epididymoorchitis for which he was treated with fosfomycin and  pulmonary nodules found on PET scan.   09/08/19 --> 09/12/19 Presented for Chest pain, found to have NSTEMI. He was deemed not to be interventional at that time due to urinary bleeding requiring cessation of plavix previously, so plan was for medical treatment. Continue ASA, statin titration, anti-anginals including amlodipine and IMDUR. Foley was in place for urinary retention. He is noted to have renal cell carcinoma that is well known and following with Urology. Patient was discharged to [facility].   09/19/19 --> 09/28/19 Admission for chest pain again. Troponins did not rise >100. He was managed with home medications. BP was labile with highs and lows. Noted to have issues with orthostasis. Discharged back to [facility].   Elevated troponin S peaked at 25.  Episode of chest pain the night of 10/01/19 and 10/02/19.  Seen by cardiology, ongoing cardiac meds adjustment.  10/03/19: Seen by cardiology, ordered 30-day event monitor for further evaluation of syncope.  Signed off.  10/07/19: No acute events.  Awaiting insurance authorization.  Hospital Course:  Active Problems:   Labile hypertension   Carotid artery disease (HCC)   Coronary artery disease involving native coronary artery of native heart with angina pectoris (HCC)   Diabetes mellitus type 2 in nonobese (HCC)   Severe aortic stenosis   S/P TAVR (transcatheter aortic valve replacement)   Chest pain   Orthostatic hypotension   Urinary retention   CHF (congestive heart failure) (HCC)   Syncope and collapse   Syncope   Syncope, likely orthostatic in origin -Initially positive orthostatic vital signs -Cardiology will add thigh-high compression hose and will order event monitor as outpatient to evaluate for arrhythmias. -TAVR stable on 2D echo. -Follow-up with cardiology outpatient.  Essential hypertension -Medications adjusted by cardiology Recommendations as stated above.  Resolved Chest pain, ruled out  ACS Coronary artery disease involving native coronary artery of native heart with angina pectoris - He has had an extensive course of hospitalizations recently, worsened by anxiety.  Ranexa added by cardiology -Cardiac medications as noted above  Left renal mass with concern for malignancy Will need to keep his appointment follow up with urology Dr. Tresa Moore to discuss plan  Pulmonary nodules Will need follow up outpatient   Chronic anxiety Resume prior to admission regimen Follow up with your PCP  Resolved LLQ abdominal pain Abdominal x-ray with no acute findings.  Carotid artery disease (HCC) Right carotid artery stenosis, 40 to 59% on carotid ultrasound done on 08/17/2019. 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  Ambulatory dysfunction PT OT recommended SNF Continue PT OT with assistance and fall precautions.  Skin candidiasis affecting groins Continue nystatin powder 3 times daily x5 days.    Code Status:Full  Discharge Exam: BP (!) 132/50 (BP Location: Right Arm)   Pulse 71   Temp 98.4 F (36.9 C) (Oral)   Resp 16   Ht 5\' 10"  (1.778 m)   Wt 80.8 kg   SpO2 94%   BMI 25.57 kg/m  . General: 79 y.o. year-old male well-developed well-nourished  . alert and interactive.  No acute  distress.   . Cardiovascular: Regular rate and rhythm no rubs or gallops. Marland Kitchen Respiratory: Clear to auscultation no wheezes or rales.   . Abdomen: Soft nontender normal bowel sounds present.   . Musculoskeletal: No lower extremity edema bilaterally. Marland Kitchen Psychiatry: Mood is appropriate for condition and setting.  Discharge Instructions You were cared for by a hospitalist during your hospital stay. If you have any questions about your discharge medications or the care you received while you were in the hospital after you are discharged, you can call the unit and asked to speak with the hospitalist on call if the hospitalist that took care of you is not available. Once you are discharged, your primary care physician will handle any further medical issues. Please note that NO REFILLS for any discharge medications will be authorized once you are discharged, as it is imperative that you return to your primary care physician (or establish a relationship with a primary care physician if you do not have one) for your aftercare needs so that they can reassess your need for medications and monitor your lab values.   Allergies as of 10/07/2019      Reactions   Macrodantin [nitrofurantoin] Other (See Comments)   "blocked kidneys"    Tape Other (See Comments)   "Tears my skin and leaves marks." PLEASE USE COBAN!!   Sulfa Antibiotics Rash   Lisinopril Cough   Nutritional Supplements Other (See Comments)   Protein drink given to me "did something"   Penicillins Other (See Comments)   08/30/19- Patient can't remember, was 35+ years ago. OK with trying cephalexin in hospital      Medication List    STOP taking these medications   amLODipine 5 MG tablet Commonly known as: NORVASC   fosfomycin 3 g Pack Commonly known as: MONUROL   Trilipix 135 MG capsule Generic drug: Choline Fenofibrate Replaced by: fenofibrate 160 MG tablet     TAKE these medications   acetaminophen 325 MG tablet Commonly known as:  TYLENOL Take 650 mg by mouth every 6 (six) hours as needed for mild pain or moderate pain.   aspirin 81 MG chewable tablet Chew 1 tablet (81 mg total) by mouth daily.   carboxymethylcellulose 0.5 % Soln Commonly known as: REFRESH PLUS Place 1 drop into both eyes every 12 (twelve) hours as needed (dry eyes).   clonazePAM 0.5 MG tablet Commonly known as: KLONOPIN Take 1 tablet (0.5 mg total) by mouth 4 (four) times daily.   Coenzyme Q10 200 MG capsule Take 200 mg by mouth daily.   fenofibrate 160 MG tablet Take 1 tablet (160 mg total) by mouth daily. Replaces: Trilipix 135 MG capsule   isosorbide mononitrate 30 MG 24 hr tablet Commonly known as: IMDUR Take 1 tablet (30 mg total) by mouth daily. What changed:   medication strength  how much to take   losartan 50 MG tablet Commonly known as: COZAAR Take 1 tablet (50 mg total) by mouth daily.   magnesium oxide 400 (241.3 Mg) MG tablet  Commonly known as: MAG-OX Take 1 tablet (400 mg total) by mouth daily.   metoprolol tartrate 50 MG tablet Commonly known as: LOPRESSOR Take 1 tablet (50 mg total) by mouth 2 (two) times daily.   nitroGLYCERIN 0.4 MG SL tablet Commonly known as: NITROSTAT Place 1 tablet (0.4 mg total) under the tongue every 5 (five) minutes as needed for chest pain (up to 3 doses). Do not give if blood pressure is low (less than 604 systolic). What changed:   when to take this  reasons to take this  additional instructions   NovoLOG FlexPen 100 UNIT/ML FlexPen Generic drug: insulin aspart Inject 1-9 Units into the skin See admin instructions. Inject as per sliding scale: If 70-120= 0 units; 121-150= 1 unit; 151-200= 2 units; 201-250= 3 units; 251-300= 5 units; 301-350= 7 units; 351-400= 9 units GREATER THAN 400 CALL MD, subcutaneously before meals for DM What changed: Another medication with the same name was removed. Continue taking this medication, and follow the directions you see here.   nystatin  powder Commonly known as: MYCOSTATIN/NYSTOP Apply topically 3 (three) times daily for 5 days. To groins   glucose blood test strip 1 each by Other route as needed.   ONE TOUCH ULTRA TEST test strip Generic drug: glucose blood 1 each by Other route daily.   OneTouch Delica Plus [id number] Misc 1 each by Other route daily.   polyethylene glycol 17 g packet Commonly known as: MIRALAX / GLYCOLAX Take 17 g by mouth daily as needed for mild constipation.   ranolazine 500 MG 12 hr tablet Commonly known as: RANEXA Take 1 tablet (500 mg total) by mouth 2 (two) times daily.   rosuvastatin 20 MG tablet Commonly known as: CRESTOR Take 1 tablet (20 mg total) by mouth daily. What changed: when to take this      Allergies  Allergen Reactions  . Macrodantin [Nitrofurantoin] Other (See Comments)    "blocked kidneys"   . Tape Other (See Comments)    "Tears my skin and leaves marks." PLEASE USE COBAN!!  . Sulfa Antibiotics Rash  . Lisinopril Cough  . Nutritional Supplements Other (See Comments)    Protein drink given to me "did something"  . Penicillins Other (See Comments)    08/30/19- Patient can't remember, was 35+ years ago. OK with trying cephalexin in hospital    Follow-up Information    Tisovec, Fransico Him, MD. Call in 1 day(s).   Specialty: Internal Medicine Why: PLease call for a post hospital follow up appointment Contact information: [place] [address] [phone]        Deboraha Sprang, MD .   Specialty: Cardiology Contact information: [address] [place] [address] [phone]        Eileen Stanford, PA-C Follow up.   Specialties: Cardiology, Radiology Why: Follow-up scheduled for 10/16/2019 at 2:30pm. Please arrive 15 minutes early for check-in. Contact information: [address] [address] [phone]        Alexis Frock, MD. Call in 1 day(s).   Specialty: Urology Why:  Please call for a post hospital follow up appointment Contact information: [place] [address] [phone]                The results of significant diagnostics from this hospitalization (including imaging, microbiology, ancillary and laboratory) are listed below for reference.    Significant Diagnostic Studies: DG Chest 2 View  Result Date: 10/01/2019  CLINICAL DATA:  Chest pain EXAM: CHEST - 2 VIEW COMPARISON:  September 19, 2019 FINDINGS: There is mild cardiomegaly. Aortic valve stent is noted. Aortic knob calcifications are seen. Both lungs are clear. No pleural effusion. No acute osseous abnormality is seen. IMPRESSION: No active cardiopulmonary disease. Electronically Signed   By: Prudencio Pair M.D.   On: 10/01/2019 00:49   DG Chest 2 View  Result Date: 09/08/2019 CLINICAL DATA:  Chest pain and shortness of breath. EXAM: CHEST - 2 VIEW COMPARISON:  August 11, 2019 FINDINGS: There is no evidence of acute infiltrate, pleural effusion or pneumothorax. A stable subcentimeter calcified nodular opacity is seen overlying the upper left lung. The heart size and mediastinal contours are within normal limits. An artificial aortic valve is seen. This represents a new finding when compared to the prior study. The visualized skeletal structures are unremarkable. IMPRESSION: No active cardiopulmonary disease. Electronically Signed   By: Virgina Norfolk M.D.   On: 09/08/2019 22:16   DG Chest Port 1 View  Result Date: 09/19/2019 CLINICAL DATA:  79 year old male with chest pain. EXAM: PORTABLE CHEST 1 VIEW COMPARISON:  Chest radiograph dated 09/08/2019. FINDINGS: There are bibasilar linear atelectasis/scarring. No focal consolidation, pleural effusion, or pneumothorax. Probable background of mild emphysema. There is mild cardiomegaly. Coronary vascular calcification and aortic valve repair. Atherosclerotic calcification of the aortic arch. No acute osseous pathology. IMPRESSION: 1. No acute  cardiopulmonary process. 2. Mild cardiomegaly. Electronically Signed   By: Anner Crete M.D.   On: 09/19/2019 15:16   DG Abd 2 Views  Result Date: 10/01/2019 CLINICAL DATA:  Abdominal pain EXAM: ABDOMEN - 2 VIEW COMPARISON:  CT, 08/24/2019. FINDINGS: Normal bowel gas pattern. Scattered aortoiliac atherosclerotic calcifications. No evidence of renal or ureteral stones. Soft tissues otherwise unremarkable. No acute skeletal abnormality. IMPRESSION: 1. No acute findings.  Normal bowel gas pattern. Electronically Signed   By: Lajean Manes M.D.   On: 10/01/2019 05:14   ECHOCARDIOGRAM COMPLETE  Result Date: 10/02/2019    ECHOCARDIOGRAM REPORT   Patient Name:   CLOYS VERA Date of Exam: 10/02/2019 Medical Rec #:  [id number]      Height:       70.0 in Accession #:    [id number]     Weight:       181.9 lb Date of Birth:  Mar 17, 1940      BSA:          2.005 m Patient Age:    30 years       BP:           131/57 mmHg Patient Gender: M              HR:           65 bpm. Exam Location:  Inpatient Procedure: 2D Echo, 3D Echo and Strain Analysis                                MODIFIED REPORT:    This report was modified by Cherlynn Kaiser MD on 10/02/2019 due to report                                    revision.  Indications:     Chest Pain 786.50 / R07.9                  Syncope  780.2 / R55  History:         Patient has prior history of Echocardiogram examinations, most                  recent 09/12/2019. CHF, CAD and Previous Myocardial Infarction,                  Carotid Disease, Arrythmias:PVC, Signs/Symptoms:Chest Pain and                  Hypotension; Risk Factors:Sleep Apnea.                  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 Procedure Date:                  08/15/2019.  Sonographer:     Darlina Sicilian RDCS Referring Phys:  [id number] Darreld Mclean Diagnosing Phys: Cherlynn Kaiser MD IMPRESSIONS  1. Left ventricular ejection  fraction, by estimation, is 60 to 65%. The left ventricle has normal function. The left ventricle has no regional wall motion abnormalities. There is moderate asymmetric left ventricular hypertrophy of the septal segment. Left ventricular diastolic parameters are consistent with Grade I diastolic dysfunction (impaired relaxation).  2. Right ventricular systolic function is normal. The right ventricular size is normal.  3. The mitral valve is normal in structure. Trivial mitral valve regurgitation. No evidence of mitral stenosis.  4. The aortic valve has been repaired/replaced. Aortic valve regurgitation No paravalvular or valvular regurgitation. 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 Procedure Date: 08/15/2019. Echo findings are consistent with normal structure and function of the aortic valve prosthesis. Aortic valve mean gradient measures 13.0 mmHg.  5. The inferior vena cava is normal in size with greater than 50% respiratory variability, suggesting right atrial pressure of 3 mmHg. Comparison(s): Stable prosthetic aortic valve systolic gradient compared to 09/12/19. FINDINGS  Left Ventricle: Left ventricular ejection fraction, by estimation, is 60 to 65%. The left ventricle has normal function. The left ventricle has no regional wall motion abnormalities. The left ventricular internal cavity size was normal in size. There is  moderate asymmetric left ventricular hypertrophy of the septal segment. Left ventricular diastolic parameters are consistent with Grade I diastolic dysfunction (impaired relaxation). Right Ventricle: The right ventricular size is normal. No increase in right ventricular wall thickness. Right ventricular systolic function is normal. Left Atrium: Left atrial size was normal in size. Right Atrium: Right atrial size was normal in size. Pericardium: There is no evidence of pericardial effusion. Mitral Valve: The mitral valve is normal in structure.  Normal mobility of the mitral valve leaflets. Trivial mitral valve regurgitation. No evidence of mitral valve stenosis. Tricuspid Valve: The tricuspid valve is normal in structure. Tricuspid valve regurgitation is trivial. No evidence of tricuspid stenosis. Aortic Valve: The aortic valve has been repaired/replaced. Aortic valve regurgitation No paravalvular or valvular regurgitation. Aortic valve mean gradient measures 13.0 mmHg. Aortic valve peak gradient measures 23.3 mmHg. Aortic valve area, by VTI measures 1.79 cm. 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 Procedure Date: 08/15/2019. Echo findings are consistent with normal structure and function of the aortic valve prosthesis. Pulmonic Valve: The pulmonic valve was normal in structure. Pulmonic valve regurgitation is trivial. No evidence of pulmonic  stenosis. Aorta: The aortic root is normal in size and structure. Venous: The inferior vena cava is normal in size with greater than 50% respiratory variability, suggesting right atrial pressure of 3 mmHg. IAS/Shunts: No atrial level shunt detected by color flow Doppler.  LEFT VENTRICLE PLAX 2D LVIDd:         5.10 cm      Diastology LVIDs:         3.20 cm      LV e' lateral:   7.13 cm/s LV PW:         0.90 cm      LV E/e' lateral: 10.9 LV IVS:        1.40 cm      LV e' medial:    4.80 cm/s LVOT diam:     2.50 cm      LV E/e' medial:  16.2 LV SV:         87 LV SV Index:   43 LVOT Area:     4.91 cm  LV Volumes (MOD) LV vol d, MOD A2C: 134.0 ml LV vol d, MOD A4C: 114.0 ml LV vol s, MOD A2C: 54.1 ml LV vol s, MOD A4C: 59.2 ml LV SV MOD A2C:     79.9 ml LV SV MOD A4C:     114.0 ml LV SV MOD BP:      69.3 ml RIGHT VENTRICLE RV S prime:     16.80 cm/s TAPSE (M-mode): 2.5 cm LEFT ATRIUM             Index       RIGHT ATRIUM           Index LA diam:        3.80 cm 1.90 cm/m  RA Area:     12.20 cm LA Vol (A2C):   43.8 ml 21.85 ml/m RA Volume:   23.40 ml  11.67 ml/m LA  Vol (A4C):   41.1 ml 20.50 ml/m LA Biplane Vol: 45.1 ml 22.50 ml/m  AORTIC VALVE AV Area (Vmax):    1.75 cm AV Area (Vmean):   1.74 cm AV Area (VTI):     1.79 cm AV Vmax:           241.50 cm/s AV Vmean:          167.750 cm/s AV VTI:            0.484 m AV Peak Grad:      23.3 mmHg AV Mean Grad:      13.0 mmHg LVOT Vmax:         86.20 cm/s LVOT Vmean:        59.300 cm/s LVOT VTI:          0.177 m LVOT/AV VTI ratio: 0.37  AORTA Ao Asc diam: 3.40 cm MITRAL VALVE MV Area (PHT): 2.29 cm    SHUNTS MV Decel Time: 331 msec    Systemic VTI:  0.18 m MV E velocity: 77.60 cm/s  Systemic Diam: 2.50 cm MV A velocity: 90.80 cm/s MV E/A ratio:  0.85 Cherlynn Kaiser MD Electronically signed by Cherlynn Kaiser MD Signature Date/Time: 10/02/2019/7:46:50 PM    Final (Updated)    ECHOCARDIOGRAM LIMITED  Result Date: 09/12/2019    ECHOCARDIOGRAM LIMITED REPORT   Patient Name:   VERN GUERETTE Date of Exam: 09/10/2019 Medical Rec #:  [id number]      Height:       70.0 in Accession #:    [id number]  Weight:       188.5 lb Date of Birth:  May 23, 1940      BSA:          2.035 m Patient Age:    75 years       BP:           161/72 mmHg Patient Gender: M              HR:           74 bpm. Exam Location:  Inpatient Procedure: Limited Echo, Limited Color Doppler and Cardiac Doppler Indications:    ACS/S/P TAVR 1 month  History:        Patient has prior history of Echocardiogram examinations, most                 recent 08/16/2019.                 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 Procedure Date:                 08/15/2019.  Sonographer:    Clayton Lefort RDCS (AE) Referring Phys: [id number] CARRIEL T NIPP IMPRESSIONS  1. Stable findings 1 month post TAVR, normal function of the bioprosthetic valve, mean transaortic gradient 12 mmHg, no paravalvular leak.  2. Left ventricular ejection fraction, by estimation, is 60 to 65%. The left ventricle has normal function. The  left ventricle has no regional wall motion abnormalities. There is moderate concentric left ventricular hypertrophy. Left ventricular diastolic parameters are consistent with Grade I diastolic dysfunction (impaired relaxation). Elevated left atrial pressure.  3. Right ventricular systolic function is normal. The right ventricular size is normal.  4. The mitral valve is normal in structure. Trivial mitral valve regurgitation. No evidence of mitral stenosis.  5. The aortic valve has been repaired/replaced. Aortic valve regurgitation is not visualized. No aortic stenosis is present. 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 Procedure Date: 08/15/2019. Echo findings  are consistent with normal structure and function of the aortic valve prosthesis.  6. The inferior vena cava is normal in size with greater than 50% respiratory variability, suggesting right atrial pressure of 3 mmHg. FINDINGS  Left Ventricle: Left ventricular ejection fraction, by estimation, is 60 to 65%. The left ventricle has normal function. The left ventricle has no regional wall motion abnormalities. The left ventricular internal cavity size was normal in size. 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 Elevated left atrial pressure. Right Ventricle: The right ventricular size is normal. No increase in right ventricular wall thickness. Right ventricular systolic function is normal. Left Atrium: Left atrial size was normal in size. Right Atrium: Right atrial size was normal in size. Pericardium: There is no evidence of pericardial effusion. Mitral Valve: The mitral valve is normal in structure. Normal mobility of the mitral valve leaflets. Trivial mitral valve regurgitation. No evidence of mitral valve stenosis. Tricuspid Valve: The tricuspid valve is normal in structure. Tricuspid valve regurgitation is mild . No evidence of tricuspid stenosis. Aortic Valve: The aortic valve has been  repaired/replaced. Aortic valve regurgitation is not visualized. No aortic stenosis is present. Aortic valve mean gradient measures 12.0 mmHg. Aortic valve peak gradient measures 23.4 mmHg. Aortic valve area, by VTI measures 1.96 cm. 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 Procedure Date: 08/15/2019. Echo findings are consistent with normal structure and function of the aortic valve prosthesis. Pulmonic Valve: The pulmonic valve was normal in structure. Pulmonic valve regurgitation is not visualized. No evidence of pulmonic stenosis. Aorta: The aortic root is normal in size and structure. Venous: The inferior vena cava is normal in size with greater than 50% respiratory variability, suggesting right atrial pressure of 3 mmHg. IAS/Shunts: No atrial level shunt detected by color flow Doppler.  LEFT VENTRICLE PLAX 2D LVIDd:         3.80 cm  Diastology LVIDs:         3.40 cm  LV e' lateral:   6.90 cm/s LV PW:         1.70 cm  LV E/e' lateral: 8.9 LV IVS:        1.80 cm  LV e' medial:    4.20 cm/s LVOT diam:     2.60 cm  LV E/e' medial:  14.7 LV SV:         87 LV SV Index:   43 LVOT Area:     5.31 cm  IVC IVC diam: 1.60 cm LEFT ATRIUM         Index LA diam:    3.20 cm 1.57 cm/m  AORTIC VALVE AV Area (Vmax):    2.03 cm AV Area (Vmean):   2.11 cm AV Area (VTI):     1.96 cm AV Vmax:           242.00 cm/s AV Vmean:          159.000 cm/s AV VTI:            0.442 m AV Peak Grad:      23.4 mmHg AV Mean Grad:      12.0 mmHg LVOT Vmax:         92.70 cm/s LVOT Vmean:        63.100 cm/s LVOT VTI:          0.163 m LVOT/AV VTI ratio: 0.37  AORTA Ao Root diam: 3.40 cm Ao Asc diam:  3.10 cm MITRAL VALVE MV Area (PHT): 2.37 cm    SHUNTS MV Decel Time: 320 msec    Systemic VTI:  0.16 m MV E velocity: 61.70 cm/s  Systemic Diam: 2.60 cm MV A velocity: 88.30 cm/s MV E/A ratio:  0.70 Ena Dawley MD Electronically signed by Ena Dawley MD Signature Date/Time:  09/12/2019/10:42:31 AM    Final     Microbiology: Recent Results (from the past 240 hour(s))  SARS Coronavirus 2 by RT PCR (hospital order, performed in [place] hospital lab) Nasopharyngeal Nasopharyngeal Swab     Status: None   Collection Time: 10/01/19  2:32 AM   Specimen: Nasopharyngeal Swab  Result Value Ref Range Status   SARS Coronavirus 2 NEGATIVE NEGATIVE Final    Comment: (NOTE) SARS-CoV-2 target nucleic acids are NOT DETECTED.  The SARS-CoV-2 RNA is generally detectable in upper and lower respiratory specimens during the acute phase of infection. The lowest concentration of SARS-CoV-2 viral copies this assay can detect is 250 copies / mL. A negative result does not preclude SARS-CoV-2 infection and should not be used as the sole basis for treatment or other patient management decisions.  A negative result may occur with improper specimen collection / handling, submission of specimen other than nasopharyngeal swab, presence of viral mutation(s) within the areas targeted by this assay, and inadequate number of viral copies (<  250 copies / mL). A negative result must be combined with clinical observations, patient history, and epidemiological information.  Fact Sheet for Patients:   StrictlyIdeas.no  Fact Sheet for Healthcare Providers: BankingDealers.co.za  This test is not yet approved or  cleared by the [place] FDA and has been authorized for detection and/or diagnosis of SARS-CoV-2 by FDA under an Emergency Use Authorization (EUA).  This EUA will remain in effect (meaning this test can be used) for the duration of the COVID-19 declaration under Section 564(b)(1) of the Act, 21 U.S.C. section 360bbb-3(b)(1), unless the authorization is terminated or revoked sooner.  Performed at [facility], [place] [address], [place], [place] [address]      Labs: Basic Metabolic Panel: Recent Labs  Lab 10/01/19 0015  10/01/19 0429 10/02/19 0525  NA 135 135 136  K 3.9 4.1 3.8  CL 103 103 103  CO2 22 21* 25  GLUCOSE 218* 232* 194*  BUN 14 16 12   CREATININE 0.99 0.90 0.86  CALCIUM 9.8 9.7 9.3  MG  --   --  1.9  PHOS  --   --  2.9   Liver Function Tests: No results for input(s): AST, ALT, ALKPHOS, BILITOT, PROT, ALBUMIN in the last 168 hours. No results for input(s): LIPASE, AMYLASE in the last 168 hours. No results for input(s): AMMONIA in the last 168 hours. CBC: Recent Labs  Lab 10/01/19 0015 10/01/19 0429 10/02/19 0525  WBC 8.0 8.8 7.5  HGB 13.6 13.6 13.7  HCT 41.9 42.0 42.6  MCV 84.0 84.0 84.9  PLT 166 140* 130*   Cardiac Enzymes: No results for input(s): CKTOTAL, CKMB, CKMBINDEX, TROPONINI in the last 168 hours. BNP: BNP (last 3 results) Recent Labs    08/11/19 0844 09/08/19 2139 09/09/19 0308  BNP 238.5* 222.2* 326.2*    ProBNP (last 3 results) No results for input(s): PROBNP in the last 8760 hours.  CBG: Recent Labs  Lab 10/06/19 0634 10/06/19 1120 10/06/19 1638 10/06/19 2135 10/07/19 0610  GLUCAP 166* 246* 268* 140* 160*       Signed:  Kayleen Memos, MD [facility] 10/07/2019, 11:01 AM

## 2019-10-07 NOTE — Plan of Care (Signed)
  Problem: Education: Goal: Knowledge of condition and prescribed therapy will improve Outcome: Progressing   Problem: Education: Goal: Understanding of cardiac disease, CV risk reduction, and recovery process will improve Outcome: Progressing   Problem: Activity: Goal: Ability to tolerate increased activity will improve Outcome: Progressing   Problem: Cardiac: Goal: Ability to achieve and maintain adequate cardiovascular perfusion will improve Outcome: Progressing

## 2019-10-07 NOTE — TOC Progression Note (Addendum)
Transition of Care [facility]) - Progression Note    Patient Details  Name: FALLON HOWERTER MRN: [id number] Date of Birth: 03-26-1940  Transition of Care [facility]) CM/SW [place], [place] Phone Number: [phone]  10/07/2019, 9:47 AM  Clinical Narrative:     CSW spoke with spouse and daughter Amy about the insurance appeal. They notified CSW that appeal was initiated yesterday and they were informed that they should hear something back within 48-72 hours. Amy provided insurance # [id number].  CSW followed up with [facility] and was informed that a decision had not been made yet.  Avoidable days entered.  TOC team will continue to assist with discharge planning needs.  Expected Discharge Plan: [place] Barriers to Discharge: Continued Medical Work up  Expected Discharge Plan and Services Expected Discharge Plan: [place] In-house Referral: Clinical Social Work     Living arrangements for the past 2 months: Single Family Home Expected Discharge Date: 10/06/19                                     Social Determinants of Health (SDOH) Interventions    Readmission Risk Interventions No flowsheet data found.

## 2019-10-08 LAB — GLUCOSE, CAPILLARY
Glucose-Capillary: 166 mg/dL — ABNORMAL HIGH (ref 70–99)
Glucose-Capillary: 196 mg/dL — ABNORMAL HIGH (ref 70–99)
Glucose-Capillary: 214 mg/dL — ABNORMAL HIGH (ref 70–99)
Glucose-Capillary: 187 mg/dL — ABNORMAL HIGH (ref 70–99)

## 2019-10-08 LAB — BASIC METABOLIC PANEL
Anion gap: 11 (ref 5–15)
BUN: 14 mg/dL (ref 8–23)
CO2: 23 mmol/L (ref 22–32)
Calcium: 9.6 mg/dL (ref 8.9–10.3)
Chloride: 98 mmol/L (ref 98–111)
Creatinine, Ser: 1.18 mg/dL (ref 0.61–1.24)
GFR calc Af Amer: >60 mL/min (ref >60)
GFR calc non Af Amer: 59 mL/min — ABNORMAL LOW (ref >60)
Glucose, Bld: 171 mg/dL — ABNORMAL HIGH (ref 70–99)
Potassium: 3.8 mmol/L (ref 3.5–5.1)
Sodium: 132 mmol/L — ABNORMAL LOW (ref 135–145)

## 2019-10-08 LAB — MAGNESIUM: Magnesium: 1.7 mg/dL (ref 1.7–2.4)

## 2019-10-08 MED ORDER — ALUM & MAG HYDROXIDE-SIMETH 200-200-20 MG/5ML PO SUSP
30.0000 mL | Freq: Three times a day (TID) | ORAL | Status: DC | PRN
  Administered 2019-10-08 – 2019-10-10 (×3): 30 mL via ORAL
  Filled 2019-10-08 (×3): qty 30

## 2019-10-08 MED ORDER — MAGNESIUM SULFATE 2 GM/50ML IV SOLN
2.0000 g | Freq: Once | INTRAVENOUS | Status: AC
  Administered 2019-10-08: 2 g via INTRAVENOUS
  Filled 2019-10-08: qty 50

## 2019-10-08 NOTE — TOC Progression Note (Signed)
Transition of Care [facility]) - Progression Note    Patient Details  Name: Alexis Griffith MRN: [id number] Date of Birth: 12/02/1940  Transition of Care [facility]) CM/SW [place],  Phone Number: [phone] 10/08/2019, 8:57 AM  Clinical Narrative:    CSW was asked by [facility] to send updated clinical in regards to the appeal.  CSW faxed the clinicals.  TOC team will continue to assist with discharge planning needs.  Expected Discharge Plan: [place] Barriers to Discharge: Continued Medical Work up  Expected Discharge Plan and Services Expected Discharge Plan: [place] In-house Referral: Clinical Social Work     Living arrangements for the past 2 months: Single Family Home Expected Discharge Date: 10/06/19                                     Social Determinants of Health (SDOH) Interventions    Readmission Risk Interventions No flowsheet data found.

## 2019-10-08 NOTE — Progress Notes (Signed)
Discharge Summary  Alexis Griffith BWL:[id number] DOB: [birth year]  PCP: Haywood Pao, MD  Admit date: 09/30/2019 Discharge date: 10/08/2019  Time spent: 35 minutes  Recommendations for Outpatient Follow-up:  1. Follow-up with cardiology 2. Follow-up with your urologist 3. Follow-up with your primary care provider 4. Take your medications as described 5. Continue PT OT with assistance and fall precautions.  Recommendations per cardiology: Medication Recommendations:  Ranexa 500mg  BID, ASA 81mg  daily, fenofibrate 160mg  daily, Imdur 30mg  daily, Losartan 50mg  daily, Lopressor 50mg  BID and Crestor 20mg  daily.   Other recommendations (labs, testing, etc):  event monitor Follow up as an outpatient:  F/U with [facility]>>appt on 10/16/19  Discharge Diagnoses:  Active Hospital Problems   Diagnosis Date Noted  . Syncope   . Syncope and collapse 10/01/2019  . CHF (congestive heart failure) ([place]) 09/25/2019  . Orthostatic hypotension 09/11/2019  . Urinary retention 09/11/2019  . Chest pain   . 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 Diabetes mellitus type 2 in nonobese (HCC)   . Severe aortic stenosis   . Coronary artery disease involving native coronary artery of native heart with angina pectoris ([place]) 07/04/2019  . Carotid artery disease ([place]) 08/24/2011  . Labile hypertension     Resolved Hospital Problems  No resolved problems to display.    Discharge Condition: Stable  Diet recommendation: Heart healthy carb modified diet  Vitals:   10/08/19 0100 10/08/19 0453  BP: (!) 167/67 (!) 130/59  Pulse: 68 69  Resp:  16  Temp:  98.2 F (36.8 C)  SpO2:  97%    History of present illness:  Alexis Griffith a 79 y.o.malewith medical history significant ofCAD, stroke, AS s/p AVR, renal mass with concern for renal carcinoma, orthostatic hypotension and recurrent syncope, HTN, HLD, h/o GCA, DM2, HFpEF (TTE 09/10/19 showed normal EF with a Grade 1  diastolic dysfunction and functional AVR), carotid artery disease, urinary retention with chronic foley and chronic anxiety who presents for chest pain. The patient reports that on Saturday (day of admission) patient developed a tightening up of his chest. It was similar feeling to his previous NSTEMI, but not as bad. About a 5/10. Pain did not radiate. He is currently at [facility] for rehab and he notes that he gets very nervous on the weekends as they have lower staff and he is concerned something will happen to him. He is on 4X per day anxiolytic at this time. He was brought to the ED and after triage was placed in the waiting room. He reports at that time having some issues with anxiety as well given he was surrounded by people and he was worried about how long it would take to be seen. He developed dizziness and then does not remember anything prior to being in his ED room. Per report from EDP, patient briefly lost consciousness and was reported that he lost a pulse, though this was for a brief time. He awoke upon being brought into triage. He has a history of recurrent syncope due to orthostatic hypotension and has at times been taken off many of his blood pressure medications. Further symptoms include discomfort from the foley catheter (has had to cancel follow up with urology 3X to do voiding trials) and some LLQ pain which he is not sure if coming from his bladder or bowels. He denies constipation. He denies heartburn, vomiting, diarrhea, fever, chills, recent illness.   Review of recent hospitalizations:  08/15/19 --> 08/31/19 TAVR on  08/15/19. Hospital course complicated by a peri procedural small infarct of the left medial frontal cortex, recurrent syncope due to orthostasis (only on metoprolol on discharge), acute urinary retention requiring coude catheterization and follow up with Urology, UTI and epididymoorchitis for which he was treated with fosfomycin and pulmonary nodules found  on PET scan.   09/08/19 --> 09/12/19 Presented for Chest pain, found to have NSTEMI. He was deemed not to be interventional at that time due to urinary bleeding requiring cessation of plavix previously, so plan was for medical treatment. Continue ASA, statin titration, anti-anginals including amlodipine and IMDUR. Foley was in place for urinary retention. He is noted to have renal cell carcinoma that is well known and following with Urology. Patient was discharged to [place] home.   09/19/19 --> 09/28/19 Admission for chest pain again. Troponins did not rise >100. He was managed with home medications. BP was labile with highs and lows. Noted to have issues with orthostasis. Discharged back to [facility].   Elevated troponin S peaked at 25.  Episode of chest pain the night of 10/01/19 and 10/02/19.  Seen by cardiology, ongoing cardiac meds adjustment.  10/03/19: Seen by cardiology, ordered 30-day event monitor for further evaluation of syncope.  Signed off.  10/08/19: No acute events.  Awaiting insurance authorization. Hopeful he will go to SNF tomorrow.  Hospital Course:  Active Problems:   Labile hypertension   Carotid artery disease (HCC)   Coronary artery disease involving native coronary artery of native heart with angina pectoris (HCC)   Diabetes mellitus type 2 in nonobese (HCC)   Severe aortic stenosis   S/P TAVR (transcatheter aortic valve replacement)   Chest pain   Orthostatic hypotension   Urinary retention   CHF (congestive heart failure) (HCC)   Syncope and collapse   Syncope   Syncope, likely orthostatic in origin -Initially positive orthostatic vital signs -Cardiology will add thigh-high compression hose and will order event monitor as outpatient to evaluate for arrhythmias. -TAVR stable on 2D echo. -Follow-up with cardiology outpatient.  Essential hypertension -Medications adjusted by cardiology Recommendations as stated above.  Resolved Chest pain,  ruled out ACS Coronary artery disease involving native coronary artery of native heart with angina pectoris - He has had an extensive course of hospitalizations recently, worsened by anxiety.  Ranexa added by cardiology -Cardiac medications as noted above  Left renal mass with concern for malignancy Will need to keep his appointment follow up with urology Dr. Tresa Moore to discuss plan  Pulmonary nodules Will need follow up outpatient   Chronic anxiety Resume prior to admission regimen Follow up with your PCP  Resolved LLQ abdominal pain Abdominal x-ray with no acute findings.  Carotid artery disease (HCC) Right carotid artery stenosis, 40 to 59% on carotid ultrasound done on 08/17/2019. 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  Hypomagnesemia Serum magnesium 1.7 Repleted intravenously Continue oral magnesium supplementation.  Ambulatory dysfunction PT OT recommended SNF Continue PT OT with assistance and fall precautions.  Skin candidiasis affecting groins Continue nystatin powder 3 times daily x5 days.    Code Status:Full  Discharge Exam: BP (!) 130/59 (BP Location: Right Arm)   Pulse 69   Temp 98.2 F (36.8 C) (Oral)   Resp 16   Ht 5\' 10"  (1.778 m)   Wt 80.5 kg   SpO2 97%   BMI 25.45  kg/m  . General: 79 y.o. year-old male pleasant well-developed well-nourished in no acute stress.  Alert and oriented x3.   . Cardiovascular: Regular rate and rhythm no rubs or gallops.  No JVD or thyromegaly noted.   Marland Kitchen Respiratory: Clear to auscultation no wheezes or rales.  Good inspiratory effort.   . Abdomen: Soft nontender normal bowel sounds present . Musculoskeletal: No lower extremity edema bilaterally.   Marland Kitchen Psychiatry: Mood is appropriate for condition and setting.  Discharge Instructions You were cared for by a hospitalist during your hospital stay. If you have any questions about your discharge medications or the care you received while you were in the hospital after you are discharged, you can call the unit and asked to speak with the hospitalist on call if the hospitalist that took care of you is not available. Once you are discharged, your primary care physician will handle any further medical issues. Please note that NO REFILLS for any discharge medications will be authorized once you are discharged, as it is imperative that you return to your primary care physician (or establish a relationship with a primary care physician if you do not have one) for your aftercare needs so that they can reassess your need for medications and monitor your lab values.   Allergies as of 10/08/2019      Reactions   Macrodantin [nitrofurantoin] Other (See Comments)   "blocked kidneys"    Tape Other (See Comments)   "Tears my skin and leaves marks." PLEASE USE COBAN!!   Sulfa Antibiotics Rash   Lisinopril Cough   Nutritional Supplements Other (See Comments)   Protein drink given to me "did something"   Penicillins Other (See Comments)   08/30/19- Patient can't remember, was 35+ years ago. OK with trying cephalexin in hospital      Medication List    STOP taking these medications   amLODipine 5 MG tablet Commonly known as: NORVASC   fosfomycin 3 g Pack Commonly known as: MONUROL   Trilipix  135 MG capsule Generic drug: Choline Fenofibrate Replaced by: fenofibrate 160 MG tablet     TAKE these medications   acetaminophen 325 MG tablet Commonly known as: TYLENOL Take 650 mg by mouth every 6 (six) hours as needed for mild pain or moderate pain.   aspirin 81 MG chewable tablet Chew 1 tablet (81 mg total) by mouth daily.   carboxymethylcellulose 0.5 % Soln Commonly known as: REFRESH PLUS Place 1 drop into both eyes every 12 (twelve) hours as needed (dry eyes).   clonazePAM 0.5 MG tablet Commonly known as: KLONOPIN Take 1 tablet (0.5 mg total) by mouth 4 (four) times daily.   Coenzyme Q10 200 MG capsule Take 200 mg by mouth daily.   fenofibrate 160 MG tablet Take 1 tablet (160 mg total) by mouth daily. Replaces: Trilipix 135 MG capsule   isosorbide mononitrate 30 MG 24 hr tablet Commonly known as: IMDUR Take 1 tablet (30 mg total) by mouth daily. What changed:   medication strength  how much to take  losartan 50 MG tablet Commonly known as: COZAAR Take 1 tablet (50 mg total) by mouth daily.   magnesium oxide 400 (241.3 Mg) MG tablet Commonly known as: MAG-OX Take 1 tablet (400 mg total) by mouth daily.   metoprolol tartrate 50 MG tablet Commonly known as: LOPRESSOR Take 1 tablet (50 mg total) by mouth 2 (two) times daily.   nitroGLYCERIN 0.4 MG SL tablet Commonly known as: NITROSTAT Place 1 tablet (0.4 mg total) under the tongue every 5 (five) minutes as needed for chest pain (up to 3 doses). Do not give if blood pressure is low (less than 782 systolic). What changed:   when to take this  reasons to take this  additional instructions   NovoLOG FlexPen 100 UNIT/ML FlexPen Generic drug: insulin aspart Inject 1-9 Units into the skin See admin instructions. Inject as per sliding scale: If 70-120= 0 units; 121-150= 1 unit; 151-200= 2 units; 201-250= 3 units; 251-300= 5 units; 301-350= 7 units; 351-400= 9 units GREATER THAN 400 CALL MD, subcutaneously  before meals for DM What changed: Another medication with the same name was removed. Continue taking this medication, and follow the directions you see here.   nystatin powder Commonly known as: MYCOSTATIN/NYSTOP Apply topically 3 (three) times daily for 5 days. To groins   glucose blood test strip 1 each by Other route as needed.   ONE TOUCH ULTRA TEST test strip Generic drug: glucose blood 1 each by Other route daily.   OneTouch Delica Plus [id number] Misc 1 each by Other route daily.   polyethylene glycol 17 g packet Commonly known as: MIRALAX / GLYCOLAX Take 17 g by mouth daily as needed for mild constipation.   ranolazine 500 MG 12 hr tablet Commonly known as: RANEXA Take 1 tablet (500 mg total) by mouth 2 (two) times daily.   rosuvastatin 20 MG tablet Commonly known as: CRESTOR Take 1 tablet (20 mg total) by mouth daily. What changed: when to take this      Allergies  Allergen Reactions  . Macrodantin [Nitrofurantoin] Other (See Comments)    "blocked kidneys"   . Tape Other (See Comments)    "Tears my skin and leaves marks." PLEASE USE COBAN!!  . Sulfa Antibiotics Rash  . Lisinopril Cough  . Nutritional Supplements Other (See Comments)    Protein drink given to me "did something"  . Penicillins Other (See Comments)    08/30/19- Patient can't remember, was 35+ years ago. OK with trying cephalexin in hospital    Follow-up Information    Tisovec, Fransico Him, MD. Call in 1 day(s).   Specialty: Internal Medicine Why: PLease call for a post hospital follow up appointment Contact information: [place] [address] [phone]        Deboraha Sprang, MD .   Specialty: Cardiology Contact information: [address] [address] [phone]        Eileen Stanford, PA-C Follow up.   Specialties: Cardiology, Radiology Why: Follow-up scheduled for 10/16/2019 at 2:30pm. Please arrive 15 minutes early for  check-in. Contact information: [address] [place] [address] [phone]        Alexis Frock, MD. Call in 1 day(s).   Specialty: Urology Why: Please call for a post hospital follow up appointment Contact information: [address] [address] [phone]                The results of significant diagnostics from this  hospitalization (including imaging, microbiology, ancillary and laboratory) are listed below for reference.    Significant Diagnostic Studies: DG Chest 2 View  Result Date: 10/01/2019 CLINICAL DATA:  Chest pain EXAM: CHEST - 2 VIEW COMPARISON:  September 19, 2019 FINDINGS: There is mild cardiomegaly. Aortic valve stent is noted. Aortic knob calcifications are seen. Both lungs are clear. No pleural effusion. No acute osseous abnormality is seen. IMPRESSION: No active cardiopulmonary disease. Electronically Signed   By: Prudencio Pair M.D.   On: 10/01/2019 00:49   DG Chest 2 View  Result Date: 09/08/2019 CLINICAL DATA:  Chest pain and shortness of breath. EXAM: CHEST - 2 VIEW COMPARISON:  August 11, 2019 FINDINGS: There is no evidence of acute infiltrate, pleural effusion or pneumothorax. A stable subcentimeter calcified nodular opacity is seen overlying the upper left lung. The heart size and mediastinal contours are within normal limits. An artificial aortic valve is seen. This represents a new finding when compared to the prior study. The visualized skeletal structures are unremarkable. IMPRESSION: No active cardiopulmonary disease. Electronically Signed   By: Virgina Norfolk M.D.   On: 09/08/2019 22:16   DG Chest Port 1 View  Result Date: 09/19/2019 CLINICAL DATA:  79 year old male with chest pain. EXAM: PORTABLE CHEST 1 VIEW COMPARISON:  Chest radiograph dated 09/08/2019. FINDINGS: There are bibasilar linear atelectasis/scarring. No focal consolidation, pleural effusion, or pneumothorax. Probable background of mild emphysema. There is  mild cardiomegaly. Coronary vascular calcification and aortic valve repair. Atherosclerotic calcification of the aortic arch. No acute osseous pathology. IMPRESSION: 1. No acute cardiopulmonary process. 2. Mild cardiomegaly. Electronically Signed   By: Anner Crete M.D.   On: 09/19/2019 15:16   DG Abd 2 Views  Result Date: 10/01/2019 CLINICAL DATA:  Abdominal pain EXAM: ABDOMEN - 2 VIEW COMPARISON:  CT, 08/24/2019. FINDINGS: Normal bowel gas pattern. Scattered aortoiliac atherosclerotic calcifications. No evidence of renal or ureteral stones. Soft tissues otherwise unremarkable. No acute skeletal abnormality. IMPRESSION: 1. No acute findings.  Normal bowel gas pattern. Electronically Signed   By: Lajean Manes M.D.   On: 10/01/2019 05:14   ECHOCARDIOGRAM COMPLETE  Result Date: 10/02/2019    ECHOCARDIOGRAM REPORT   Patient Name:   Alexis Griffith Date of Exam: 10/02/2019 Medical Rec #:  [id number]      Height:       70.0 in Accession #:    [id number]     Weight:       181.9 lb Date of Birth:  06/29/1940      BSA:          2.005 m Patient Age:    74 years       BP:           131/57 mmHg Patient Gender: M              HR:           65 bpm. Exam Location:  Inpatient Procedure: 2D Echo, 3D Echo and Strain Analysis                                MODIFIED REPORT:    This report was modified by Cherlynn Kaiser MD on 10/02/2019 due to report                                    revision.  Indications:  Chest Pain 786.50 / R07.9                  Syncope 780.2 / R55  History:         Patient has prior history of Echocardiogram examinations, most                  recent 09/12/2019. CHF, CAD and Previous Myocardial Infarction,                  Carotid Disease, Arrythmias:PVC, Signs/Symptoms:Chest Pain and                  Hypotension; Risk Factors:Sleep Apnea.                  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 Procedure Date:                   08/15/2019.  Sonographer:     Darlina Sicilian RDCS Referring Phys:  [id number] Darreld Mclean Diagnosing Phys: Cherlynn Kaiser MD IMPRESSIONS  1. Left ventricular ejection fraction, by estimation, is 60 to 65%. The left ventricle has normal function. The left ventricle has no regional wall motion abnormalities. There is moderate asymmetric left ventricular hypertrophy of the septal segment. Left ventricular diastolic parameters are consistent with Grade I diastolic dysfunction (impaired relaxation).  2. Right ventricular systolic function is normal. The right ventricular size is normal.  3. The mitral valve is normal in structure. Trivial mitral valve regurgitation. No evidence of mitral stenosis.  4. The aortic valve has been repaired/replaced. Aortic valve regurgitation No paravalvular or valvular regurgitation. 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 Procedure Date: 08/15/2019. Echo findings are consistent with normal structure and function of the aortic valve prosthesis. Aortic valve mean gradient measures 13.0 mmHg.  5. The inferior vena cava is normal in size with greater than 50% respiratory variability, suggesting right atrial pressure of 3 mmHg. Comparison(s): Stable prosthetic aortic valve systolic gradient compared to 09/12/19. FINDINGS  Left Ventricle: Left ventricular ejection fraction, by estimation, is 60 to 65%. The left ventricle has normal function. The left ventricle has no regional wall motion abnormalities. The left ventricular internal cavity size was normal in size. There is  moderate asymmetric left ventricular hypertrophy of the septal segment. Left ventricular diastolic parameters are consistent with Grade I diastolic dysfunction (impaired relaxation). Right Ventricle: The right ventricular size is normal. No increase in right ventricular wall thickness. Right ventricular systolic function is normal. Left Atrium: Left atrial size was normal in  size. Right Atrium: Right atrial size was normal in size. Pericardium: There is no evidence of pericardial effusion. Mitral Valve: The mitral valve is normal in structure. Normal mobility of the mitral valve leaflets. Trivial mitral valve regurgitation. No evidence of mitral valve stenosis. Tricuspid Valve: The tricuspid valve is normal in structure. Tricuspid valve regurgitation is trivial. No evidence of tricuspid stenosis. Aortic Valve: The aortic valve has been repaired/replaced. Aortic valve regurgitation No paravalvular or valvular regurgitation. Aortic valve mean gradient measures 13.0 mmHg. Aortic valve peak gradient measures 23.3 mmHg. Aortic valve area, by VTI measures 1.79 cm. 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 Procedure Date: 08/15/2019. Echo findings are consistent with normal structure and function  of the aortic valve prosthesis. Pulmonic Valve: The pulmonic valve was normal in structure. Pulmonic valve regurgitation is trivial. No evidence of pulmonic stenosis. Aorta: The aortic root is normal in size and structure. Venous: The inferior vena cava is normal in size with greater than 50% respiratory variability, suggesting right atrial pressure of 3 mmHg. IAS/Shunts: No atrial level shunt detected by color flow Doppler.  LEFT VENTRICLE PLAX 2D LVIDd:         5.10 cm      Diastology LVIDs:         3.20 cm      LV e' lateral:   7.13 cm/s LV PW:         0.90 cm      LV E/e' lateral: 10.9 LV IVS:        1.40 cm      LV e' medial:    4.80 cm/s LVOT diam:     2.50 cm      LV E/e' medial:  16.2 LV SV:         87 LV SV Index:   43 LVOT Area:     4.91 cm  LV Volumes (MOD) LV vol d, MOD A2C: 134.0 ml LV vol d, MOD A4C: 114.0 ml LV vol s, MOD A2C: 54.1 ml LV vol s, MOD A4C: 59.2 ml LV SV MOD A2C:     79.9 ml LV SV MOD A4C:     114.0 ml LV SV MOD BP:      69.3 ml RIGHT VENTRICLE RV S prime:     16.80 cm/s TAPSE (M-mode): 2.5 cm LEFT ATRIUM              Index       RIGHT ATRIUM           Index LA diam:        3.80 cm 1.90 cm/m  RA Area:     12.20 cm LA Vol (A2C):   43.8 ml 21.85 ml/m RA Volume:   23.40 ml  11.67 ml/m LA Vol (A4C):   41.1 ml 20.50 ml/m LA Biplane Vol: 45.1 ml 22.50 ml/m  AORTIC VALVE AV Area (Vmax):    1.75 cm AV Area (Vmean):   1.74 cm AV Area (VTI):     1.79 cm AV Vmax:           241.50 cm/s AV Vmean:          167.750 cm/s AV VTI:            0.484 m AV Peak Grad:      23.3 mmHg AV Mean Grad:      13.0 mmHg LVOT Vmax:         86.20 cm/s LVOT Vmean:        59.300 cm/s LVOT VTI:          0.177 m LVOT/AV VTI ratio: 0.37  AORTA Ao Asc diam: 3.40 cm MITRAL VALVE MV Area (PHT): 2.29 cm    SHUNTS MV Decel Time: 331 msec    Systemic VTI:  0.18 m MV E velocity: 77.60 cm/s  Systemic Diam: 2.50 cm MV A velocity: 90.80 cm/s MV E/A ratio:  0.85 Cherlynn Kaiser MD Electronically signed by Cherlynn Kaiser MD Signature Date/Time: 10/02/2019/7:46:50 PM    Final (Updated)    ECHOCARDIOGRAM LIMITED  Result Date: 09/12/2019    ECHOCARDIOGRAM LIMITED REPORT   Patient Name:   Alexis Griffith Date of Exam: 09/10/2019 Medical Rec #:  [id number]  Height:       70.0 in Accession #:    [id number]     Weight:       188.5 lb Date of Birth:  21-Apr-1940      BSA:          2.035 m Patient Age:    63 years       BP:           161/72 mmHg Patient Gender: M              HR:           74 bpm. Exam Location:  Inpatient Procedure: Limited Echo, Limited Color Doppler and Cardiac Doppler Indications:    ACS/S/P TAVR 1 month  History:        Patient has prior history of Echocardiogram examinations, most                 recent 08/16/2019.                 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 Procedure Date:                 08/15/2019.  Sonographer:    Clayton Lefort RDCS (AE) Referring Phys: [id number] CARRIEL T NIPP IMPRESSIONS  1. Stable findings 1 month post TAVR, normal function of the bioprosthetic  valve, mean transaortic gradient 12 mmHg, no paravalvular leak.  2. Left ventricular ejection fraction, by estimation, is 60 to 65%. The left ventricle has normal function. The left ventricle has no regional wall motion abnormalities. There is moderate concentric left ventricular hypertrophy. Left ventricular diastolic parameters are consistent with Grade I diastolic dysfunction (impaired relaxation). Elevated left atrial pressure.  3. Right ventricular systolic function is normal. The right ventricular size is normal.  4. The mitral valve is normal in structure. Trivial mitral valve regurgitation. No evidence of mitral stenosis.  5. The aortic valve has been repaired/replaced. Aortic valve regurgitation is not visualized. No aortic stenosis is present. 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 Procedure Date: 08/15/2019. Echo findings  are consistent with normal structure and function of the aortic valve prosthesis.  6. The inferior vena cava is normal in size with greater than 50% respiratory variability, suggesting right atrial pressure of 3 mmHg. FINDINGS  Left Ventricle: Left ventricular ejection fraction, by estimation, is 60 to 65%. The left ventricle has normal function. The left ventricle has no regional wall motion abnormalities. The left ventricular internal cavity size was normal in size. 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 Elevated left atrial pressure. Right Ventricle: The right ventricular size is normal. No increase in right ventricular wall thickness. Right ventricular systolic function is normal. Left Atrium: Left atrial size was normal in size. Right Atrium: Right atrial size was normal in size. Pericardium: There is no evidence of pericardial effusion. Mitral Valve: The mitral valve is normal in structure. Normal mobility of the mitral valve leaflets. Trivial mitral valve regurgitation. No evidence of mitral valve stenosis.  Tricuspid Valve: The tricuspid valve is normal in structure. Tricuspid valve regurgitation is mild . No evidence of tricuspid stenosis. Aortic Valve: The aortic valve has been repaired/replaced. Aortic valve regurgitation is not visualized. No aortic stenosis is present. Aortic valve mean gradient measures 12.0 mmHg. Aortic valve peak gradient  measures 23.4 mmHg. Aortic valve area, by VTI measures 1.96 cm. 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 Procedure Date: 08/15/2019. Echo findings are consistent with normal structure and function of the aortic valve prosthesis. Pulmonic Valve: The pulmonic valve was normal in structure. Pulmonic valve regurgitation is not visualized. No evidence of pulmonic stenosis. Aorta: The aortic root is normal in size and structure. Venous: The inferior vena cava is normal in size with greater than 50% respiratory variability, suggesting right atrial pressure of 3 mmHg. IAS/Shunts: No atrial level shunt detected by color flow Doppler.  LEFT VENTRICLE PLAX 2D LVIDd:         3.80 cm  Diastology LVIDs:         3.40 cm  LV e' lateral:   6.90 cm/s LV PW:         1.70 cm  LV E/e' lateral: 8.9 LV IVS:        1.80 cm  LV e' medial:    4.20 cm/s LVOT diam:     2.60 cm  LV E/e' medial:  14.7 LV SV:         87 LV SV Index:   43 LVOT Area:     5.31 cm  IVC IVC diam: 1.60 cm LEFT ATRIUM         Index LA diam:    3.20 cm 1.57 cm/m  AORTIC VALVE AV Area (Vmax):    2.03 cm AV Area (Vmean):   2.11 cm AV Area (VTI):     1.96 cm AV Vmax:           242.00 cm/s AV Vmean:          159.000 cm/s AV VTI:            0.442 m AV Peak Grad:      23.4 mmHg AV Mean Grad:      12.0 mmHg LVOT Vmax:         92.70 cm/s LVOT Vmean:        63.100 cm/s LVOT VTI:          0.163 m LVOT/AV VTI ratio: 0.37  AORTA Ao Root diam: 3.40 cm Ao Asc diam:  3.10 cm MITRAL VALVE MV Area (PHT): 2.37 cm    SHUNTS MV Decel Time: 320 msec    Systemic VTI:  0.16 m MV E velocity:  61.70 cm/s  Systemic Diam: 2.60 cm MV A velocity: 88.30 cm/s MV E/A ratio:  0.70 Ena Dawley MD Electronically signed by Ena Dawley MD Signature Date/Time: 09/12/2019/10:42:31 AM    Final     Microbiology: Recent Results (from the past 240 hour(s))  SARS Coronavirus 2 by RT PCR (hospital order, performed in [place] hospital lab) Nasopharyngeal Nasopharyngeal Swab     Status: None   Collection Time: 10/01/19  2:32 AM   Specimen: Nasopharyngeal Swab  Result Value Ref Range Status   SARS Coronavirus 2 NEGATIVE NEGATIVE Final    Comment: (NOTE) SARS-CoV-2 target nucleic acids are NOT DETECTED.  The SARS-CoV-2 RNA is generally detectable in upper and lower respiratory specimens during the acute phase of infection. The lowest concentration of SARS-CoV-2 viral copies this assay can detect is 250 copies / mL. A negative result does not preclude SARS-CoV-2 infection and should not be used as the sole basis for treatment or other patient management decisions.  A negative result may occur with improper specimen collection / handling, submission of specimen other than  nasopharyngeal swab, presence of viral mutation(s) within the areas targeted by this assay, and inadequate number of viral copies (<250 copies / mL). A negative result must be combined with clinical observations, patient history, and epidemiological information.  Fact Sheet for Patients:   StrictlyIdeas.no  Fact Sheet for Healthcare Providers: BankingDealers.co.za  This test is not yet approved or  cleared by the [place] FDA and has been authorized for detection and/or diagnosis of SARS-CoV-2 by FDA under an Emergency Use Authorization (EUA).  This EUA will remain in effect (meaning this test can be used) for the duration of the COVID-19 declaration under Section 564(b)(1) of the Act, 21 U.S.C. section 360bbb-3(b)(1), unless the authorization is terminated  or revoked sooner.  Performed at [facility], [place] [address] [place], [place] [address]      Labs: Basic Metabolic Panel: Recent Labs  Lab 10/02/19 0525 10/08/19 0651  NA 136 132*  K 3.8 3.8  CL 103 98  CO2 25 23  GLUCOSE 194* 171*  BUN 12 14  CREATININE 0.86 1.18  CALCIUM 9.3 9.6  MG 1.9 1.7  PHOS 2.9  --    Liver Function Tests: No results for input(s): AST, ALT, ALKPHOS, BILITOT, PROT, ALBUMIN in the last 168 hours. No results for input(s): LIPASE, AMYLASE in the last 168 hours. No results for input(s): AMMONIA in the last 168 hours. CBC: Recent Labs  Lab 10/02/19 0525  WBC 7.5  HGB 13.7  HCT 42.6  MCV 84.9  PLT 130*   Cardiac Enzymes: No results for input(s): CKTOTAL, CKMB, CKMBINDEX, TROPONINI in the last 168 hours. BNP: BNP (last 3 results) Recent Labs    08/11/19 0844 09/08/19 2139 09/09/19 0308  BNP 238.5* 222.2* 326.2*    ProBNP (last 3 results) No results for input(s): PROBNP in the last 8760 hours.  CBG: Recent Labs  Lab 10/07/19 1202 10/07/19 1647 10/07/19 2116 10/08/19 0624 10/08/19 1139  GLUCAP 174* 191* 218* 166* 196*       Signed:  Kayleen Memos, MD [facility] 10/08/2019, 1:15 PM

## 2019-10-09 LAB — GLUCOSE, CAPILLARY
Glucose-Capillary: 162 mg/dL — ABNORMAL HIGH (ref 70–99)
Glucose-Capillary: 185 mg/dL — ABNORMAL HIGH (ref 70–99)
Glucose-Capillary: 172 mg/dL — ABNORMAL HIGH (ref 70–99)
Glucose-Capillary: 226 mg/dL — ABNORMAL HIGH (ref 70–99)

## 2019-10-09 LAB — SARS CORONAVIRUS 2 (TAT 6-24 HRS): SARS Coronavirus 2: NEGATIVE

## 2019-10-09 NOTE — Progress Notes (Signed)
Discharge Summary  Alexis Griffith NIO:[id number] DOB: 1940-04-19  PCP: Haywood Pao, MD  Admit date: 09/30/2019 Discharge date: 10/09/2019  Time spent: 35 minutes  Recommendations for Outpatient Follow-up:  1. Follow-up with cardiology 2. Follow-up with your urologist 3. Follow-up with your primary care provider 4. Take your medications as described 5. Continue PT OT with assistance and fall precautions.  Recommendations per cardiology: Medication Recommendations:  Ranexa 500mg  BID, ASA 81mg  daily, fenofibrate 160mg  daily, Imdur 30mg  daily, Losartan 50mg  daily, Lopressor 50mg  BID and Crestor 20mg  daily.   Other recommendations (labs, testing, etc):  event monitor Follow up as an outpatient:  F/U with [facility]>>appt on 10/16/19  Discharge Diagnoses:  Active Hospital Problems   Diagnosis Date Noted  . Syncope   . Syncope and collapse 10/01/2019  . CHF (congestive heart failure) ([place]) 09/25/2019  . Orthostatic hypotension 09/11/2019  . Urinary retention 09/11/2019  . Chest pain   . 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 Diabetes mellitus type 2 in nonobese (HCC)   . Severe aortic stenosis   . Coronary artery disease involving native coronary artery of native heart with angina pectoris ([place]) 07/04/2019  . Carotid artery disease ([place]) 08/24/2011  . Labile hypertension     Resolved Hospital Problems  No resolved problems to display.    Discharge Condition: Stable  Diet recommendation: Heart healthy carb modified diet  Vitals:   10/09/19 0959 10/09/19 1212  BP: (!) 136/45 (!) 116/58  Pulse: 66 (!) 59  Resp:  20  Temp:  97.8 F (36.6 C)  SpO2: 96% 95%    History of present illness:  Alexis Griffith a 78 y.o.malewith medical history significant ofCAD, stroke, AS s/p AVR, renal mass with concern for renal carcinoma, orthostatic hypotension and recurrent syncope, HTN, HLD, h/o GCA, DM2, HFpEF (TTE 09/10/19 showed normal EF with a  Grade 1 diastolic dysfunction and functional AVR), carotid artery disease, urinary retention with chronic foley and chronic anxiety who presents for chest pain. The patient reports that on Saturday (day of admission) patient developed a tightening up of his chest. It was similar feeling to his previous NSTEMI, but not as bad. About a 5/10. Pain did not radiate. He is currently at [facility] for rehab and he notes that he gets very nervous on the weekends as they have lower staff and he is concerned something will happen to him. He is on 4X per day anxiolytic at this time. He was brought to the ED and after triage was placed in the waiting room. He reports at that time having some issues with anxiety as well given he was surrounded by people and he was worried about how long it would take to be seen. He developed dizziness and then does not remember anything prior to being in his ED room. Per report from EDP, patient briefly lost consciousness and was reported that he lost a pulse, though this was for a brief time. He awoke upon being brought into triage. He has a history of recurrent syncope due to orthostatic hypotension and has at times been taken off many of his blood pressure medications. Further symptoms include discomfort from the foley catheter (has had to cancel follow up with urology 3X to do voiding trials) and some LLQ pain which he is not sure if coming from his bladder or bowels. He denies constipation. He denies heartburn, vomiting, diarrhea, fever, chills, recent illness.   Review of recent hospitalizations:  08/15/19 --> 08/31/19 TAVR  on 08/15/19. Hospital course complicated by a peri procedural small infarct of the left medial frontal cortex, recurrent syncope due to orthostasis (only on metoprolol on discharge), acute urinary retention requiring coude catheterization and follow up with Urology, UTI and epididymoorchitis for which he was treated with fosfomycin and pulmonary nodules  found on PET scan.   09/08/19 --> 09/12/19 Presented for Chest pain, found to have NSTEMI. He was deemed not to be interventional at that time due to urinary bleeding requiring cessation of plavix previously, so plan was for medical treatment. Continue ASA, statin titration, anti-anginals including amlodipine and IMDUR. Foley was in place for urinary retention. He is noted to have renal cell carcinoma that is well known and following with Urology. Patient was discharged to [facility].   09/19/19 --> 09/28/19 Admission for chest pain again. Troponins did not rise >100. He was managed with home medications. BP was labile with highs and lows. Noted to have issues with orthostasis. Discharged back to [facility].   Elevated troponin S peaked at 25.  Episode of chest pain the night of 10/01/19 and 10/02/19.  Seen by cardiology, ongoing cardiac meds adjustment.  10/03/19: Seen by cardiology, ordered 30-day event monitor for further evaluation of syncope.  Signed off.  10/09/19: No acute events.  Awaiting insurance authorization.    Hospital Course:  Active Problems:   Labile hypertension   Carotid artery disease (HCC)   Coronary artery disease involving native coronary artery of native heart with angina pectoris (HCC)   Diabetes mellitus type 2 in nonobese (HCC)   Severe aortic stenosis   S/P TAVR (transcatheter aortic valve replacement)   Chest pain   Orthostatic hypotension   Urinary retention   CHF (congestive heart failure) (HCC)   Syncope and collapse   Syncope   Syncope, likely orthostatic in origin -Initially positive orthostatic vital signs -Cardiology will add thigh-high compression hose and will order event monitor as outpatient to evaluate for arrhythmias. -TAVR stable on 2D echo. -Follow-up with cardiology outpatient.  Essential hypertension -Medications adjusted by cardiology Recommendations as stated above.  Resolved Chest pain, ruled out ACS Coronary artery  disease involving native coronary artery of native heart with angina pectoris - He has had an extensive course of hospitalizations recently, worsened by anxiety.  Ranexa added by cardiology -Cardiac medications as noted above  Left renal mass with concern for malignancy Will need to keep his appointment follow up with urology Dr. Tresa Moore to discuss plan  Pulmonary nodules Will need follow up outpatient   Chronic anxiety Resume prior to admission regimen Follow up with your PCP  Resolved LLQ abdominal pain Abdominal x-ray with no acute findings.  Carotid artery disease (HCC) Right carotid artery stenosis, 40 to 59% on carotid ultrasound done on 08/17/2019. 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  Hypomagnesemia Serum magnesium 1.7 Repleted intravenously Continue oral magnesium supplementation.  Ambulatory dysfunction PT OT  recommended SNF Continue PT OT with assistance and fall precautions.  Skin candidiasis affecting groins Continue nystatin powder 3 times daily x5 days.    Code Status:Full  Discharge Exam: BP (!) 116/58 (BP Location: Right Arm)   Pulse (!) 59   Temp 97.8 F (36.6 C) (Oral)   Resp 20   Ht 5\' 10"  (1.778 m)   Wt 81 kg   SpO2 95%   BMI 25.61 kg/m  . General: 79 y.o.  year-old male pleasant well-developed well-nourished in no acute stress.  Alert and oriented x3.   . Cardiovascular: Regular rate and rhythm no rubs or gallops.  No JVD or thyromegaly noted.   Marland Kitchen Respiratory: Clear to auscultation no wheezes or rales.  Good inspiratory effort.   . Abdomen: Soft nontender normal bowel sounds present . Musculoskeletal: No lower extremity edema bilaterally.   Marland Kitchen Psychiatry: Mood is appropriate for condition and setting.  Discharge Instructions You were cared for by a hospitalist during your hospital stay. If you have any questions about your discharge medications or the care you received while you were in the hospital after you are discharged, you can call the unit and asked to speak with the hospitalist on call if the hospitalist that took care of you is not available. Once you are discharged, your primary care physician will handle any further medical issues. Please note that NO REFILLS for any discharge medications will be authorized once you are discharged, as it is imperative that you return to your primary care physician (or establish a relationship with a primary care physician if you do not have one) for your aftercare needs so that they can reassess your need for medications and monitor your lab values.   Allergies as of 10/09/2019      Reactions   Macrodantin [nitrofurantoin] Other (See Comments)   "blocked kidneys"    Tape Other (See Comments)   "Tears my skin and leaves marks." PLEASE USE COBAN!!   Sulfa Antibiotics Rash   Lisinopril Cough   Nutritional Supplements Other (See Comments)   Protein drink given to me "did something"   Penicillins Other (See Comments)   08/30/19- Patient can't remember, was 35+ years ago. OK with trying cephalexin in hospital      Medication List    STOP taking these medications   amLODipine 5 MG tablet Commonly known as: NORVASC   fosfomycin 3 g Pack Commonly known as: MONUROL   Trilipix 135 MG capsule Generic  drug: Choline Fenofibrate Replaced by: fenofibrate 160 MG tablet     TAKE these medications   acetaminophen 325 MG tablet Commonly known as: TYLENOL Take 650 mg by mouth every 6 (six) hours as needed for mild pain or moderate pain.   aspirin 81 MG chewable tablet Chew 1 tablet (81 mg total) by mouth daily.   carboxymethylcellulose 0.5 % Soln Commonly known as: REFRESH PLUS Place 1 drop into both eyes every 12 (twelve) hours as needed (dry eyes).   clonazePAM 0.5 MG tablet Commonly known as: KLONOPIN Take 1 tablet (0.5 mg total) by mouth 4 (four) times daily.   Coenzyme Q10 200 MG capsule Take 200 mg by mouth daily.   fenofibrate 160 MG tablet Take 1 tablet (160 mg total) by mouth daily. Replaces: Trilipix 135 MG capsule   isosorbide mononitrate 30 MG 24 hr tablet Commonly known as: IMDUR Take 1 tablet (30 mg total) by mouth daily. What changed:   medication strength  how much to take   losartan  50 MG tablet Commonly known as: COZAAR Take 1 tablet (50 mg total) by mouth daily.   magnesium oxide 400 (241.3 Mg) MG tablet Commonly known as: MAG-OX Take 1 tablet (400 mg total) by mouth daily.   metoprolol tartrate 50 MG tablet Commonly known as: LOPRESSOR Take 1 tablet (50 mg total) by mouth 2 (two) times daily.   nitroGLYCERIN 0.4 MG SL tablet Commonly known as: NITROSTAT Place 1 tablet (0.4 mg total) under the tongue every 5 (five) minutes as needed for chest pain (up to 3 doses). Do not give if blood pressure is low (less than 196 systolic). What changed:   when to take this  reasons to take this  additional instructions   NovoLOG FlexPen 100 UNIT/ML FlexPen Generic drug: insulin aspart Inject 1-9 Units into the skin See admin instructions. Inject as per sliding scale: If 70-120= 0 units; 121-150= 1 unit; 151-200= 2 units; 201-250= 3 units; 251-300= 5 units; 301-350= 7 units; 351-400= 9 units GREATER THAN 400 CALL MD, subcutaneously before meals for DM What  changed: Another medication with the same name was removed. Continue taking this medication, and follow the directions you see here.   nystatin powder Commonly known as: MYCOSTATIN/NYSTOP Apply topically 3 (three) times daily for 5 days. To groins   glucose blood test strip 1 each by Other route as needed.   ONE TOUCH ULTRA TEST test strip Generic drug: glucose blood 1 each by Other route daily.   OneTouch Delica Plus [id number] Misc 1 each by Other route daily.   polyethylene glycol 17 g packet Commonly known as: MIRALAX / GLYCOLAX Take 17 g by mouth daily as needed for mild constipation.   ranolazine 500 MG 12 hr tablet Commonly known as: RANEXA Take 1 tablet (500 mg total) by mouth 2 (two) times daily.   rosuvastatin 20 MG tablet Commonly known as: CRESTOR Take 1 tablet (20 mg total) by mouth daily. What changed: when to take this      Allergies  Allergen Reactions  . Macrodantin [Nitrofurantoin] Other (See Comments)    "blocked kidneys"   . Tape Other (See Comments)    "Tears my skin and leaves marks." PLEASE USE COBAN!!  . Sulfa Antibiotics Rash  . Lisinopril Cough  . Nutritional Supplements Other (See Comments)    Protein drink given to me "did something"  . Penicillins Other (See Comments)    08/30/19- Patient can't remember, was 35+ years ago. OK with trying cephalexin in hospital    Follow-up Information    Tisovec, Fransico Him, MD. Call in 1 day(s).   Specialty: Internal Medicine Why: PLease call for a post hospital follow up appointment Contact information: [place] [address] [phone]        Deboraha Sprang, MD .   Specialty: Cardiology Contact information: [address] [place] [address] [phone]        Eileen Stanford, PA-C Follow up.   Specialties: Cardiology, Radiology Why: Follow-up scheduled for 10/16/2019 at 2:30pm. Please arrive 15 minutes early for check-in. Contact  information: [address] [place] [address] [phone]        Alexis Frock, MD. Call in 1 day(s).   Specialty: Urology Why: Please call for a post hospital follow up appointment Contact information: [place] [address] [phone]                The results of significant diagnostics from this hospitalization (  including imaging, microbiology, ancillary and laboratory) are listed below for reference.    Significant Diagnostic Studies: DG Chest 2 View  Result Date: 10/01/2019 CLINICAL DATA:  Chest pain EXAM: CHEST - 2 VIEW COMPARISON:  September 19, 2019 FINDINGS: There is mild cardiomegaly. Aortic valve stent is noted. Aortic knob calcifications are seen. Both lungs are clear. No pleural effusion. No acute osseous abnormality is seen. IMPRESSION: No active cardiopulmonary disease. Electronically Signed   By: Prudencio Pair M.D.   On: 10/01/2019 00:49   DG Chest Port 1 View  Result Date: 09/19/2019 CLINICAL DATA:  79 year old male with chest pain. EXAM: PORTABLE CHEST 1 VIEW COMPARISON:  Chest radiograph dated 09/08/2019. FINDINGS: There are bibasilar linear atelectasis/scarring. No focal consolidation, pleural effusion, or pneumothorax. Probable background of mild emphysema. There is mild cardiomegaly. Coronary vascular calcification and aortic valve repair. Atherosclerotic calcification of the aortic arch. No acute osseous pathology. IMPRESSION: 1. No acute cardiopulmonary process. 2. Mild cardiomegaly. Electronically Signed   By: Anner Crete M.D.   On: 09/19/2019 15:16   DG Abd 2 Views  Result Date: 10/01/2019 CLINICAL DATA:  Abdominal pain EXAM: ABDOMEN - 2 VIEW COMPARISON:  CT, 08/24/2019. FINDINGS: Normal bowel gas pattern. Scattered aortoiliac atherosclerotic calcifications. No evidence of renal or ureteral stones. Soft tissues otherwise unremarkable. No acute skeletal abnormality. IMPRESSION: 1. No acute findings.  Normal bowel gas  pattern. Electronically Signed   By: Lajean Manes M.D.   On: 10/01/2019 05:14   ECHOCARDIOGRAM COMPLETE  Result Date: 10/02/2019    ECHOCARDIOGRAM REPORT   Patient Name:   MARCKUS HANOVER Date of Exam: 10/02/2019 Medical Rec #:  [id number]      Height:       70.0 in Accession #:    [id number]     Weight:       181.9 lb Date of Birth:  03-01-1941      BSA:          2.005 m Patient Age:    63 years       BP:           131/57 mmHg Patient Gender: M              HR:           65 bpm. Exam Location:  Inpatient Procedure: 2D Echo, 3D Echo and Strain Analysis                                MODIFIED REPORT:    This report was modified by Cherlynn Kaiser MD on 10/02/2019 due to report                                    revision.  Indications:     Chest Pain 786.50 / R07.9                  Syncope 780.2 / R55  History:         Patient has prior history of Echocardiogram examinations, most                  recent 09/12/2019. CHF, CAD and Previous Myocardial Infarction,                  Carotid Disease, Arrythmias:PVC, Signs/Symptoms:Chest Pain and  Hypotension; Risk Factors:Sleep Apnea.                  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 Procedure Date:                  08/15/2019.  Sonographer:     Darlina Sicilian RDCS Referring Phys:  [id number] Darreld Mclean Diagnosing Phys: Cherlynn Kaiser MD IMPRESSIONS  1. Left ventricular ejection fraction, by estimation, is 60 to 65%. The left ventricle has normal function. The left ventricle has no regional wall motion abnormalities. There is moderate asymmetric left ventricular hypertrophy of the septal segment. Left ventricular diastolic parameters are consistent with Grade I diastolic dysfunction (impaired relaxation).  2. Right ventricular systolic function is normal. The right ventricular size is normal.  3. The mitral valve is normal in structure. Trivial mitral valve regurgitation.  No evidence of mitral stenosis.  4. The aortic valve has been repaired/replaced. Aortic valve regurgitation No paravalvular or valvular regurgitation. 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 Procedure Date: 08/15/2019. Echo findings are consistent with normal structure and function of the aortic valve prosthesis. Aortic valve mean gradient measures 13.0 mmHg.  5. The inferior vena cava is normal in size with greater than 50% respiratory variability, suggesting right atrial pressure of 3 mmHg. Comparison(s): Stable prosthetic aortic valve systolic gradient compared to 09/12/19. FINDINGS  Left Ventricle: Left ventricular ejection fraction, by estimation, is 60 to 65%. The left ventricle has normal function. The left ventricle has no regional wall motion abnormalities. The left ventricular internal cavity size was normal in size. There is  moderate asymmetric left ventricular hypertrophy of the septal segment. Left ventricular diastolic parameters are consistent with Grade I diastolic dysfunction (impaired relaxation). Right Ventricle: The right ventricular size is normal. No increase in right ventricular wall thickness. Right ventricular systolic function is normal. Left Atrium: Left atrial size was normal in size. Right Atrium: Right atrial size was normal in size. Pericardium: There is no evidence of pericardial effusion. Mitral Valve: The mitral valve is normal in structure. Normal mobility of the mitral valve leaflets. Trivial mitral valve regurgitation. No evidence of mitral valve stenosis. Tricuspid Valve: The tricuspid valve is normal in structure. Tricuspid valve regurgitation is trivial. No evidence of tricuspid stenosis. Aortic Valve: The aortic valve has been repaired/replaced. Aortic valve regurgitation No paravalvular or valvular regurgitation. Aortic valve mean gradient measures 13.0 mmHg. Aortic valve peak gradient measures 23.3 mmHg. Aortic valve area, by  VTI measures 1.79 cm. 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 Procedure Date: 08/15/2019. Echo findings are consistent with normal structure and function of the aortic valve prosthesis. Pulmonic Valve: The pulmonic valve was normal in structure. Pulmonic valve regurgitation is trivial. No evidence of pulmonic stenosis. Aorta: The aortic root is normal in size and structure. Venous: The inferior vena cava is normal in size with greater than 50% respiratory variability, suggesting right atrial pressure of 3 mmHg. IAS/Shunts: No atrial level shunt detected by color flow Doppler.  LEFT VENTRICLE PLAX 2D LVIDd:         5.10 cm      Diastology LVIDs:         3.20 cm      LV e' lateral:  7.13 cm/s LV PW:         0.90 cm      LV E/e' lateral: 10.9 LV IVS:        1.40 cm      LV e' medial:    4.80 cm/s LVOT diam:     2.50 cm      LV E/e' medial:  16.2 LV SV:         87 LV SV Index:   43 LVOT Area:     4.91 cm  LV Volumes (MOD) LV vol d, MOD A2C: 134.0 ml LV vol d, MOD A4C: 114.0 ml LV vol s, MOD A2C: 54.1 ml LV vol s, MOD A4C: 59.2 ml LV SV MOD A2C:     79.9 ml LV SV MOD A4C:     114.0 ml LV SV MOD BP:      69.3 ml RIGHT VENTRICLE RV S prime:     16.80 cm/s TAPSE (M-mode): 2.5 cm LEFT ATRIUM             Index       RIGHT ATRIUM           Index LA diam:        3.80 cm 1.90 cm/m  RA Area:     12.20 cm LA Vol (A2C):   43.8 ml 21.85 ml/m RA Volume:   23.40 ml  11.67 ml/m LA Vol (A4C):   41.1 ml 20.50 ml/m LA Biplane Vol: 45.1 ml 22.50 ml/m  AORTIC VALVE AV Area (Vmax):    1.75 cm AV Area (Vmean):   1.74 cm AV Area (VTI):     1.79 cm AV Vmax:           241.50 cm/s AV Vmean:          167.750 cm/s AV VTI:            0.484 m AV Peak Grad:      23.3 mmHg AV Mean Grad:      13.0 mmHg LVOT Vmax:         86.20 cm/s LVOT Vmean:        59.300 cm/s LVOT VTI:          0.177 m LVOT/AV VTI ratio: 0.37  AORTA Ao Asc diam: 3.40 cm MITRAL VALVE MV Area (PHT): 2.29 cm    SHUNTS  MV Decel Time: 331 msec    Systemic VTI:  0.18 m MV E velocity: 77.60 cm/s  Systemic Diam: 2.50 cm MV A velocity: 90.80 cm/s MV E/A ratio:  0.85 Cherlynn Kaiser MD Electronically signed by Cherlynn Kaiser MD Signature Date/Time: 10/02/2019/7:46:50 PM    Final (Updated)    ECHOCARDIOGRAM LIMITED  Result Date: 09/12/2019    ECHOCARDIOGRAM LIMITED REPORT   Patient Name:   AMEDEO DETWEILER Date of Exam: 09/10/2019 Medical Rec #:  [id number]      Height:       70.0 in Accession #:    [id number]     Weight:       188.5 lb Date of Birth:  1941-02-24      BSA:          2.035 m Patient Age:    56 years       BP:           161/72 mmHg Patient Gender: M              HR:           74 bpm.  Exam Location:  Inpatient Procedure: Limited Echo, Limited Color Doppler and Cardiac Doppler Indications:    ACS/S/P TAVR 1 month  History:        Patient has prior history of Echocardiogram examinations, most                 recent 08/16/2019.                 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 Procedure Date:                 08/15/2019.  Sonographer:    Clayton Lefort RDCS (AE) Referring Phys: [id number] CARRIEL T NIPP IMPRESSIONS  1. Stable findings 1 month post TAVR, normal function of the bioprosthetic valve, mean transaortic gradient 12 mmHg, no paravalvular leak.  2. Left ventricular ejection fraction, by estimation, is 60 to 65%. The left ventricle has normal function. The left ventricle has no regional wall motion abnormalities. There is moderate concentric left ventricular hypertrophy. Left ventricular diastolic parameters are consistent with Grade I diastolic dysfunction (impaired relaxation). Elevated left atrial pressure.  3. Right ventricular systolic function is normal. The right ventricular size is normal.  4. The mitral valve is normal in structure. Trivial mitral valve regurgitation. No evidence of mitral stenosis.  5. The aortic valve has been  repaired/replaced. Aortic valve regurgitation is not visualized. No aortic stenosis is present. 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 Procedure Date: 08/15/2019. Echo findings  are consistent with normal structure and function of the aortic valve prosthesis.  6. The inferior vena cava is normal in size with greater than 50% respiratory variability, suggesting right atrial pressure of 3 mmHg. FINDINGS  Left Ventricle: Left ventricular ejection fraction, by estimation, is 60 to 65%. The left ventricle has normal function. The left ventricle has no regional wall motion abnormalities. The left ventricular internal cavity size was normal in size. 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 Elevated left atrial pressure. Right Ventricle: The right ventricular size is normal. No increase in right ventricular wall thickness. Right ventricular systolic function is normal. Left Atrium: Left atrial size was normal in size. Right Atrium: Right atrial size was normal in size. Pericardium: There is no evidence of pericardial effusion. Mitral Valve: The mitral valve is normal in structure. Normal mobility of the mitral valve leaflets. Trivial mitral valve regurgitation. No evidence of mitral valve stenosis. Tricuspid Valve: The tricuspid valve is normal in structure. Tricuspid valve regurgitation is mild . No evidence of tricuspid stenosis. Aortic Valve: The aortic valve has been repaired/replaced. Aortic valve regurgitation is not visualized. No aortic stenosis is present. Aortic valve mean gradient measures 12.0 mmHg. Aortic valve peak gradient measures 23.4 mmHg. Aortic valve area, by VTI measures 1.96 cm. 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 Procedure Date: 08/15/2019. Echo findings are consistent with normal structure and function of the aortic valve prosthesis. Pulmonic Valve: The pulmonic valve was  normal in structure. Pulmonic valve regurgitation is not visualized. No evidence of pulmonic stenosis. Aorta: The aortic root is normal in size and structure. Venous: The inferior vena cava is normal in size with greater than 50% respiratory variability, suggesting right atrial pressure of 3 mmHg. IAS/Shunts: No atrial level shunt detected by color flow  Doppler.  LEFT VENTRICLE PLAX 2D LVIDd:         3.80 cm  Diastology LVIDs:         3.40 cm  LV e' lateral:   6.90 cm/s LV PW:         1.70 cm  LV E/e' lateral: 8.9 LV IVS:        1.80 cm  LV e' medial:    4.20 cm/s LVOT diam:     2.60 cm  LV E/e' medial:  14.7 LV SV:         87 LV SV Index:   43 LVOT Area:     5.31 cm  IVC IVC diam: 1.60 cm LEFT ATRIUM         Index LA diam:    3.20 cm 1.57 cm/m  AORTIC VALVE AV Area (Vmax):    2.03 cm AV Area (Vmean):   2.11 cm AV Area (VTI):     1.96 cm AV Vmax:           242.00 cm/s AV Vmean:          159.000 cm/s AV VTI:            0.442 m AV Peak Grad:      23.4 mmHg AV Mean Grad:      12.0 mmHg LVOT Vmax:         92.70 cm/s LVOT Vmean:        63.100 cm/s LVOT VTI:          0.163 m LVOT/AV VTI ratio: 0.37  AORTA Ao Root diam: 3.40 cm Ao Asc diam:  3.10 cm MITRAL VALVE MV Area (PHT): 2.37 cm    SHUNTS MV Decel Time: 320 msec    Systemic VTI:  0.16 m MV E velocity: 61.70 cm/s  Systemic Diam: 2.60 cm MV A velocity: 88.30 cm/s MV E/A ratio:  0.70 Ena Dawley MD Electronically signed by Ena Dawley MD Signature Date/Time: 09/12/2019/10:42:31 AM    Final     Microbiology: Recent Results (from the past 240 hour(s))  SARS Coronavirus 2 by RT PCR (hospital order, performed in [facility] lab) Nasopharyngeal Nasopharyngeal Swab     Status: None   Collection Time: 10/01/19  2:32 AM   Specimen: Nasopharyngeal Swab  Result Value Ref Range Status   SARS Coronavirus 2 NEGATIVE NEGATIVE Final    Comment: (NOTE) SARS-CoV-2 target nucleic acids are NOT DETECTED.  The SARS-CoV-2 RNA is generally detectable in  upper and lower respiratory specimens during the acute phase of infection. The lowest concentration of SARS-CoV-2 viral copies this assay can detect is 250 copies / mL. A negative result does not preclude SARS-CoV-2 infection and should not be used as the sole basis for treatment or other patient management decisions.  A negative result may occur with improper specimen collection / handling, submission of specimen other than nasopharyngeal swab, presence of viral mutation(s) within the areas targeted by this assay, and inadequate number of viral copies (<250 copies / mL). A negative result must be combined with clinical observations, patient history, and epidemiological information.  Fact Sheet for Patients:   StrictlyIdeas.no  Fact Sheet for Healthcare Providers: BankingDealers.co.za  This test is not yet approved or  cleared by the [place] FDA and has been authorized for detection and/or diagnosis of SARS-CoV-2 by FDA under an Emergency Use Authorization (EUA).  This EUA will remain in effect (meaning this test can be used) for the duration of the COVID-19 declaration  under Section 564(b)(1) of the Act, 21 U.S.C. section 360bbb-3(b)(1), unless the authorization is terminated or revoked sooner.  Performed at [facility], [place] [address] [place], [place] [address]   SARS CORONAVIRUS 2 (TAT 6-24 HRS) Nasopharyngeal Nasopharyngeal Swab     Status: None   Collection Time: 10/08/19  8:08 PM   Specimen: Nasopharyngeal Swab  Result Value Ref Range Status   SARS Coronavirus 2 NEGATIVE NEGATIVE Final    Comment: (NOTE) SARS-CoV-2 target nucleic acids are NOT DETECTED.  The SARS-CoV-2 RNA is generally detectable in upper and lower respiratory specimens during the acute phase of infection. Negative results do not preclude SARS-CoV-2 infection, do not rule out co-infections with other pathogens, and should not be used as  the sole basis for treatment or other patient management decisions. Negative results must be combined with clinical observations, patient history, and epidemiological information. The expected result is Negative.  Fact Sheet for Patients: SugarRoll.be  Fact Sheet for Healthcare Providers: https://www.woods-mathews.com/  This test is not yet approved or cleared by the [place] FDA and  has been authorized for detection and/or diagnosis of SARS-CoV-2 by FDA under an Emergency Use Authorization (EUA). This EUA will remain  in effect (meaning this test can be used) for the duration of the COVID-19 declaration under Se ction 564(b)(1) of the Act, 21 U.S.C. section 360bbb-3(b)(1), unless the authorization is terminated or revoked sooner.  Performed at [facility], [place] [address] [place], [place] [address]      Labs: Basic Metabolic Panel: Recent Labs  Lab 10/08/19 0651  NA 132*  K 3.8  CL 98  CO2 23  GLUCOSE 171*  BUN 14  CREATININE 1.18  CALCIUM 9.6  MG 1.7   Liver Function Tests: No results for input(s): AST, ALT, ALKPHOS, BILITOT, PROT, ALBUMIN in the last 168 hours. No results for input(s): LIPASE, AMYLASE in the last 168 hours. No results for input(s): AMMONIA in the last 168 hours. CBC: No results for input(s): WBC, NEUTROABS, HGB, HCT, MCV, PLT in the last 168 hours. Cardiac Enzymes: No results for input(s): CKTOTAL, CKMB, CKMBINDEX, TROPONINI in the last 168 hours. BNP: BNP (last 3 results) Recent Labs    08/11/19 0844 09/08/19 2139 09/09/19 0308  BNP 238.5* 222.2* 326.2*    ProBNP (last 3 results) No results for input(s): PROBNP in the last 8760 hours.  CBG: Recent Labs  Lab 10/08/19 1139 10/08/19 1636 10/08/19 2149 10/09/19 0622 10/09/19 1213  GLUCAP 196* 214* 187* 162* 185*       Signed:  Kayleen Memos, MD [facility] 10/09/2019, 3:12 PM

## 2019-10-09 NOTE — Plan of Care (Signed)
  Problem: Education: Goal: Knowledge of condition and prescribed therapy will improve Outcome: Progressing   

## 2019-10-09 NOTE — Care Management Important Message (Signed)
Important Message  Patient Details  Name: Alexis Griffith MRN: [id number] Date of Birth: [birth year]   Medicare Important Message Given:  Yes     [name] 10/09/2019, 10:02 AM

## 2019-10-09 NOTE — Plan of Care (Signed)
  Problem: Health Behavior/Discharge Planning: Goal: Ability to safely manage health-related needs after discharge will improve Outcome: Completed/Met   Problem: Health Behavior/Discharge Planning: Goal: Ability to manage health-related needs will improve Outcome: Completed/Met   Problem: Clinical Measurements: Goal: Ability to maintain clinical measurements within normal limits will improve Outcome: Completed/Met Goal: Will remain free from infection Outcome: Completed/Met Goal: Diagnostic test results will improve Outcome: Completed/Met Goal: Respiratory complications will improve Outcome: Completed/Met Goal: Cardiovascular complication will be avoided Outcome: Completed/Met   Problem: Activity: Goal: Risk for activity intolerance will decrease Outcome: Completed/Met   Problem: Nutrition: Goal: Adequate nutrition will be maintained Outcome: Completed/Met   Problem: Coping: Goal: Level of anxiety will decrease Outcome: Completed/Met   Problem: Elimination: Goal: Will not experience complications related to bowel motility Outcome: Completed/Met Goal: Will not experience complications related to urinary retention Outcome: Completed/Met   Problem: Pain Managment: Goal: General experience of comfort will improve Outcome: Completed/Met   Problem: Safety: Goal: Ability to remain free from injury will improve Outcome: Completed/Met

## 2019-10-10 LAB — GLUCOSE, CAPILLARY
Glucose-Capillary: 150 mg/dL — ABNORMAL HIGH (ref 70–99)
Glucose-Capillary: 252 mg/dL — ABNORMAL HIGH (ref 70–99)
Glucose-Capillary: 160 mg/dL — ABNORMAL HIGH (ref 70–99)
Glucose-Capillary: 217 mg/dL — ABNORMAL HIGH (ref 70–99)

## 2019-10-10 NOTE — Plan of Care (Signed)
  Problem: Education: Goal: Knowledge of condition and prescribed therapy will improve Outcome: Adequate for Discharge   Problem: Cardiac: Goal: Will achieve and/or maintain adequate cardiac output Outcome: Adequate for Discharge   Problem: Physical Regulation: Goal: Complications related to the disease process, condition or treatment will be avoided or minimized Outcome: Adequate for Discharge   Problem: Education: Goal: Understanding of cardiac disease, CV risk reduction, and recovery process will improve Outcome: Adequate for Discharge Goal: Individualized Educational Video(s) Outcome: Adequate for Discharge   Problem: Activity: Goal: Ability to tolerate increased activity will improve Outcome: Adequate for Discharge   Problem: Cardiac: Goal: Ability to achieve and maintain adequate cardiovascular perfusion will improve Outcome: Adequate for Discharge   Problem: Education: Goal: Knowledge of General Education information will improve Description: Including pain rating scale, medication(s)/side effects and non-pharmacologic comfort measures Outcome: Adequate for Discharge   Problem: Skin Integrity: Goal: Risk for impaired skin integrity will decrease Outcome: Adequate for Discharge

## 2019-10-10 NOTE — Progress Notes (Signed)
D/C instructions printed and left in packet at nurse's station for PTAR. Tele and IV removed. Tolerated well.

## 2019-10-10 NOTE — Progress Notes (Signed)
Report called and given to [facility], LPN at [facility] facility. All questions answered and medications given and due discussed.

## 2019-10-10 NOTE — Progress Notes (Addendum)
Occupational Therapy Treatment Patient Details Name: Alexis Griffith MRN: [id number] DOB: [birth year] Today's Date: 10/10/2019    History of present illness Alexis Griffith is a 79 y.o. 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  Pt was d/c to SNF after recent hospitalizatin and was still there. He presented to ED with chest pain. Pt admitted with chest pain with known multivessel CAD.   OT comments  Pt presents supine in bed pleasant and willing to participate in therapy session. Orthostatics taken during session (see below), with pt asymptomatic and denies dizziness throughout activity. Pt tolerating room and hallway level mobility using RW, completing standing grooming ADL (x2 tasks) with minguard assist throughout. Pt continues to have limitations due to generalized weakness but overall tolerating session well. Will continue per POC at this time.  BP supine: 147/52 (79) Sitting: 157/53 (80) Standing: 128/59 (78) (asymptomatic) Standing x3-4 min: 141/69 (90)    Follow Up Recommendations  SNF;Supervision/Assistance - 24 hour    Equipment Recommendations  Tub/shower seat;Other (comment) (TBD)          Precautions / Restrictions Precautions Precautions: Fall Precaution Comments: watch BP       Mobility Bed Mobility Overal bed mobility: Needs Assistance Bed Mobility: Supine to Sit     Supine to sit: Min guard;HOB elevated     General bed mobility comments: for safety, no physical assist required   Transfers Overall transfer level: Needs assistance Equipment used: Rolling walker (2 wheeled) Transfers: Sit to/from Stand Sit to Stand: Min guard         General transfer comment: for safety    Balance Overall balance  assessment: Needs assistance Sitting-balance support: Feet supported;No upper extremity supported Sitting balance-Leahy Scale: Good     Standing balance support: During functional activity;No upper extremity supported;Bilateral upper extremity supported Standing balance-Leahy Scale: Fair                             ADL either performed or assessed with clinical judgement   ADL Overall ADL's : Needs assistance/impaired     Grooming: Min guard;Standing;Oral care;Wash/dry face           Upper Body Dressing : Set up;Sitting Upper Body Dressing Details (indicate cue type and reason): donning new gown Lower Body Dressing: Minimal assistance;Sit to/from stand Lower Body Dressing Details (indicate cue type and reason): assist to don R sock             Functional mobility during ADLs: Min guard;Rolling walker                         Cognition Arousal/Alertness: Awake/alert Behavior During Therapy: WFL for tasks assessed/performed                             Safety/Judgement: Decreased awareness of safety     General Comments: overall with good awareness however difficulty understanding why he can't be up moving around room on his own        Exercises     Shoulder Instructions       General Comments      Pertinent Vitals/ Pain       Pain Assessment: No/denies pain  Home Living                                          Prior Functioning/Environment              Frequency  Min 2X/week        Progress Toward Goals  OT Goals(current goals can now be found in the care plan section)  Progress towards OT goals: Progressing toward goals  Acute Rehab OT Goals Patient Stated Goal: get back to independence with walking, finish rehab and go home OT Goal Formulation: With patient Time For Goal Achievement: 10/17/19 Potential to Achieve Goals: Good ADL Goals Pt Will Perform Grooming: with modified  independence Pt Will Perform Upper Body Bathing: with modified independence Pt Will Perform Lower Body Bathing: with modified independence Pt Will Transfer to Toilet: with modified independence;regular height toilet  Plan Discharge plan remains appropriate    Co-evaluation                 AM-PAC OT "6 Clicks" Daily Activity     Outcome Measure   Help from another person eating meals?: None Help from another person taking care of personal grooming?: A Little Help from another person toileting, which includes using toliet, bedpan, or urinal?: A Little Help from another person bathing (including washing, rinsing, drying)?: A Little Help from another person to put on and taking off regular upper body clothing?: A Little Help from another person to put on and taking off regular lower body clothing?: A Little 6 Click Score: 19    End of Session Equipment Utilized During Treatment: Gait belt;Rolling walker  OT Visit Diagnosis: Unsteadiness on feet (R26.81);Muscle weakness (generalized) (M62.81)   Activity Tolerance Patient tolerated treatment well   Patient Left in chair;with call bell/phone within reach;with chair alarm set   Nurse Communication Mobility status        Time: 2500-3704 OT Time Calculation (min): 34 min  Charges: OT General Charges $OT Visit: 1 Visit OT Treatments $Self Care/Home Management : 23-37 mins  Lou Cal, OT Acute Rehabilitation Services Pager [phone] Office [phone]   [name] 10/10/2019, 9:42 AM

## 2019-10-10 NOTE — Progress Notes (Signed)
Still awaiting PTAR transport. Patient comfortable. Will continue to monitor until transport arrives

## 2019-10-10 NOTE — Progress Notes (Signed)
NURSING PROGRESS NOTE  Alexis Griffith [id number] Discharge Data: 10/10/2019 11:41 PM Attending Provider: No att. providers found JEH:[id number], Fransico Him, MD    Received report from off going RN at 23:05pm and [facility] arrived at 23:20 to transport the patient to [facility]. All the discharge necessities were performed by off-going RN.  Dierdre Forth to be D/C'd Skilled nursing facility, [facility] [place] per MD order.  Previous RN Dianna called report to [facility]. She discussed with the patient the After Visit Summary and all questions fully answered. All IV's discontinued with no bleeding noted. All belongings returned to patient for patient to take home.   Last Vital Signs:  Blood pressure (!) 149/69, pulse 82, temperature 98.7 F (37.1 C), temperature source Oral, resp. rate 20, height 5\' 10"  (1.778 m), weight 80.8 kg, SpO2 96 %.  Discharge Medication List Allergies as of 10/10/2019      Reactions   Macrodantin [nitrofurantoin] Other (See Comments)   "blocked kidneys"    Tape Other (See Comments)   "Tears my skin and leaves marks." PLEASE USE COBAN!!   Sulfa Antibiotics Rash   Lisinopril Cough   Nutritional Supplements Other (See Comments)   Protein drink given to me "did something"   Penicillins Other (See Comments)   08/30/19- Patient can't remember, was 35+ years ago. OK with trying cephalexin in hospital      Medication List    STOP taking these medications   amLODipine 5 MG tablet Commonly known as: NORVASC   fosfomycin 3 g Pack Commonly known as: MONUROL   Trilipix 135 MG capsule Generic drug: Choline Fenofibrate Replaced by: fenofibrate 160 MG tablet     TAKE these medications   acetaminophen 325 MG tablet Commonly known as: TYLENOL Take 650 mg by mouth every 6 (six) hours as needed for mild pain or moderate pain.   aspirin 81 MG chewable tablet Chew 1 tablet (81 mg total) by mouth daily.   carboxymethylcellulose 0.5 % Soln Commonly known as: REFRESH  PLUS Place 1 drop into both eyes every 12 (twelve) hours as needed (dry eyes).   clonazePAM 0.5 MG tablet Commonly known as: KLONOPIN Take 1 tablet (0.5 mg total) by mouth 4 (four) times daily.   Coenzyme Q10 200 MG capsule Take 200 mg by mouth daily.   fenofibrate 160 MG tablet Take 1 tablet (160 mg total) by mouth daily. Replaces: Trilipix 135 MG capsule   isosorbide mononitrate 30 MG 24 hr tablet Commonly known as: IMDUR Take 1 tablet (30 mg total) by mouth daily. What changed:   medication strength  how much to take   losartan 50 MG tablet Commonly known as: COZAAR Take 1 tablet (50 mg total) by mouth daily.   magnesium oxide 400 (241.3 Mg) MG tablet Commonly known as: MAG-OX Take 1 tablet (400 mg total) by mouth daily.   metoprolol tartrate 50 MG tablet Commonly known as: LOPRESSOR Take 1 tablet (50 mg total) by mouth 2 (two) times daily.   nitroGLYCERIN 0.4 MG SL tablet Commonly known as: NITROSTAT Place 1 tablet (0.4 mg total) under the tongue every 5 (five) minutes as needed for chest pain (up to 3 doses). Do not give if blood pressure is low (less than 263 systolic). What changed:   when to take this  reasons to take this  additional instructions   NovoLOG FlexPen 100 UNIT/ML FlexPen Generic drug: insulin aspart Inject 1-9 Units into the skin See admin instructions. Inject as per sliding scale: If 70-120= 0  units; 121-150= 1 unit; 151-200= 2 units; 201-250= 3 units; 251-300= 5 units; 301-350= 7 units; 351-400= 9 units GREATER THAN 400 CALL MD, subcutaneously before meals for DM What changed: Another medication with the same name was removed. Continue taking this medication, and follow the directions you see here.   nystatin powder Commonly known as: MYCOSTATIN/NYSTOP Apply topically 3 (three) times daily for 5 days. To groins   glucose blood test strip 1 each by Other route as needed.   ONE TOUCH ULTRA TEST test strip Generic drug: glucose blood 1  each by Other route daily.   OneTouch Delica Plus [id number] Misc 1 each by Other route daily.   polyethylene glycol 17 g packet Commonly known as: MIRALAX / GLYCOLAX Take 17 g by mouth daily as needed for mild constipation.   ranolazine 500 MG 12 hr tablet Commonly known as: RANEXA Take 1 tablet (500 mg total) by mouth 2 (two) times daily.   rosuvastatin 20 MG tablet Commonly known as: CRESTOR Take 1 tablet (20 mg total) by mouth daily. What changed: when to take this

## 2019-10-10 NOTE — TOC Progression Note (Addendum)
Transition of Care [facility]) - Progression Note    Patient Details  Name: Alexis Griffith MRN: [id number] Date of Birth: Jul 07, 1940  Transition of Care [facility]) CM/SW Contact  Sharlet Salina Mila Homer, LCSW Phone Number: 10/10/2019, 1:15 PM  Clinical Narrative:  Call made to [employer] in an attempt to follow-up on  an appeal made by patient's wife. CSW was transferred to several different people during the call and then was disconnected. CSW attempted to reach patient's wife [phone]) and cell number [phone]) and messages left.   Received call back from daughter Amy and they have not heard back from [facility] insurance yet regarding outcome of their appeal. She gave number as [phone]. Daughter reported that the skilled facility they want is [facility].  Call made to [facility], admissions director at [facility] (1:37 pm) and she has not been contacted by patient's insurance. CSW will continue to follow.     Expected Discharge Plan: [place] Barriers to Discharge: Continued Medical Work up  Expected Discharge Plan and Services Expected Discharge Plan: [place] In-house Referral: Clinical Social Work     Living arrangements for the past 2 months: Single Family Home Expected Discharge Date: 10/09/19                                     Social Determinants of Health (SDOH) Interventions    Readmission Risk Interventions No flowsheet data found.

## 2019-10-10 NOTE — Discharge Summary (Signed)
Discharge Summary  Alexis Griffith KKX:[id number] DOB: October 29, 1940  PCP: Haywood Pao, MD  Admit date: 09/30/2019 Discharge date: 10/10/2019  Time spent: 35 minutes  Recommendations for Outpatient Follow-up:  1. Follow-up with cardiology 2. Follow-up with your urologist 3. Follow-up with your primary care provider 4. Take your medications as described 5. Continue PT OT with assistance and fall precautions.  Recommendations per cardiology: Medication Recommendations:  Ranexa 500mg  BID, ASA 81mg  daily, fenofibrate 160mg  daily, Imdur 30mg  daily, Losartan 50mg  daily, Lopressor 50mg  BID and Crestor 20mg  daily.   Other recommendations (labs, testing, etc):  event monitor Follow up as an outpatient:  F/U with [facility] Heart>>appt on 10/16/19  Discharge Diagnoses:  Active Hospital Problems   Diagnosis Date Noted  . Syncope   . Syncope and collapse 10/01/2019  . CHF (congestive heart failure) ([place]) 09/25/2019  . Orthostatic hypotension 09/11/2019  . Urinary retention 09/11/2019  . Chest pain   . 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 Diabetes mellitus type 2 in nonobese (HCC)   . Severe aortic stenosis   . Coronary artery disease involving native coronary artery of native heart with angina pectoris ([place]) 07/04/2019  . Carotid artery disease ([place]) 08/24/2011  . Labile hypertension     Resolved Hospital Problems  No resolved problems to display.    Discharge Condition: Stable  Diet recommendation: Heart healthy carb modified diet  Vitals:   10/10/19 0504 10/10/19 1103  BP: (!) 142/58 (!) 153/58  Pulse: (!) 58 70  Resp: 18 18  Temp: 97.8 F (36.6 C) (!) 97.5 F (36.4 C)  SpO2: 97% 96%    History of present illness:  Alexis Griffith a 79 y.o.malewith medical history significant ofCAD, stroke, AS s/p AVR, renal mass with concern for renal carcinoma, orthostatic hypotension and recurrent syncope, HTN, HLD, h/o GCA, DM2, HFpEF (TTE 09/10/19  showed normal EF with a Grade 1 diastolic dysfunction and functional AVR), carotid artery disease, urinary retention with chronic foley and chronic anxiety who presents for chest pain. The patient reports that on Saturday (day of admission) patient developed a tightening up of his chest. It was similar feeling to his previous NSTEMI, but not as bad. About a 5/10. Pain did not radiate. He is currently at [facility] for rehab and he notes that he gets very nervous on the weekends as they have lower staff and he is concerned something will happen to him. He is on 4X per day anxiolytic at this time. He was brought to the ED and after triage was placed in the waiting room. He reports at that time having some issues with anxiety as well given he was surrounded by people and he was worried about how long it would take to be seen. He developed dizziness and then does not remember anything prior to being in his ED room. Per report from EDP, patient briefly lost consciousness and was reported that he lost a pulse, though this was for a brief time. He awoke upon being brought into triage. He has a history of recurrent syncope due to orthostatic hypotension and has at times been taken off many of his blood pressure medications. Further symptoms include discomfort from the foley catheter (has had to cancel follow up with urology 3X to do voiding trials) and some LLQ pain which he is not sure if coming from his bladder or bowels. He denies constipation. He denies heartburn, vomiting, diarrhea, fever, chills, recent illness.   Review of recent hospitalizations:  08/15/19 --> 08/31/19 TAVR on 08/15/19. Hospital course complicated by a peri procedural small infarct of the left medial frontal cortex, recurrent syncope due to orthostasis (only on metoprolol on discharge), acute urinary retention requiring coude catheterization and follow up with Urology, UTI and epididymoorchitis for which he was treated with  fosfomycin and pulmonary nodules found on PET scan.   09/08/19 --> 09/12/19 Presented for Chest pain, found to have NSTEMI. He was deemed not to be interventional at that time due to urinary bleeding requiring cessation of plavix previously, so plan was for medical treatment. Continue ASA, statin titration, anti-anginals including amlodipine and IMDUR. Foley was in place for urinary retention. He is noted to have renal cell carcinoma that is well known and following with Urology. Patient was discharged to [place] home.   09/19/19 --> 09/28/19 Admission for chest pain again. Troponins did not rise >100. He was managed with home medications. BP was labile with highs and lows. Noted to have issues with orthostasis. Discharged back to [facility].   7/31--> 10/10/19  Elevated troponin S peaked at 27.  Episode of chest pain the night of 10/01/19 and 10/02/19.  Cardiac medications adjusted by cardiology..  10/03/19: Cardiology ordered 30-day event monitor for further evaluation of syncope.  Signed off.  10/10/19: No acute events.  He is eager to go to physical rehab at [facility].   Hospital Course:  Active Problems:   Labile hypertension   Carotid artery disease (HCC)   Coronary artery disease involving native coronary artery of native heart with angina pectoris (HCC)   Diabetes mellitus type 2 in nonobese (HCC)   Severe aortic stenosis   S/P TAVR (transcatheter aortic valve replacement)   Chest pain   Orthostatic hypotension   Urinary retention   CHF (congestive heart failure) (HCC)   Syncope and collapse   Syncope   Syncope, likely orthostatic in origin -Initially positive orthostatic vital signs -Cardiology will add thigh-high compression hose and will order event monitor as outpatient to evaluate for arrhythmias. -TAVR stable on 2D echo. -Follow-up with cardiology outpatient.  Essential hypertension -Medications adjusted by cardiology Recommendations as stated  above.  Resolved Chest pain, ruled out ACS Coronary artery disease involving native coronary artery of native heart with angina pectoris - He has had an extensive course of hospitalizations recently, worsened by anxiety.  Ranexa added by cardiology -Cardiac medications as noted above  Left renal mass with concern for malignancy Will need to keep his appointment follow up with urology Dr. Tresa Moore to discuss plan  Pulmonary nodules Will need follow up outpatient   Chronic anxiety Resume prior to admission regimen Follow up with your PCP  Resolved LLQ abdominal pain Abdominal x-ray with no acute findings.  Carotid artery disease (HCC) Right carotid artery stenosis, 40 to 59% on carotid ultrasound done on 08/17/2019. 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  Hypomagnesemia Serum magnesium 1.7 Repleted  intravenously Continue oral magnesium supplementation.  Ambulatory dysfunction PT OT recommended SNF Continue PT OT with assistance and fall precautions.  Skin candidiasis affecting groins Continue nystatin powder 3 times daily x5 days.    Code Status:Full  Discharge Exam: BP (!) 153/58 (BP Location: Right Arm)   Pulse 70   Temp (!) 97.5 F (36.4 C) (Oral)   Resp 18   Ht 5\' 10"  (1.778 m)    Wt 80.8 kg   SpO2 96%   BMI 25.57 kg/m  . General: 79 y.o. year-old male pleasant well-developed well-nourished in no acute stress.  Alert and oriented x3.   . Cardiovascular: Regular rate and rhythm no rubs or gallops.  No JVD or thyromegaly noted.   Marland Kitchen Respiratory: Clear to auscultation no wheezes or rales.  Good inspiratory effort.   . Abdomen: Soft nontender normal bowel sounds present . Musculoskeletal: No lower extremity edema bilaterally.   Marland Kitchen Psychiatry: Mood is appropriate for condition and setting.  Discharge Instructions You were cared for by a hospitalist during your hospital stay. If you have any questions about your discharge medications or the care you received while you were in the hospital after you are discharged, you can call the unit and asked to speak with the hospitalist on call if the hospitalist that took care of you is not available. Once you are discharged, your primary care physician will handle any further medical issues. Please note that NO REFILLS for any discharge medications will be authorized once you are discharged, as it is imperative that you return to your primary care physician (or establish a relationship with a primary care physician if you do not have one) for your aftercare needs so that they can reassess your need for medications and monitor your lab values.   Allergies as of 10/10/2019      Reactions   Macrodantin [nitrofurantoin] Other (See Comments)   "blocked kidneys"    Tape Other (See Comments)   "Tears my skin and leaves marks." PLEASE USE COBAN!!   Sulfa Antibiotics Rash   Lisinopril Cough   Nutritional Supplements Other (See Comments)   Protein drink given to me "did something"   Penicillins Other (See Comments)   08/30/19- Patient can't remember, was 35+ years ago. OK with trying cephalexin in hospital      Medication List    STOP taking these medications   amLODipine 5 MG tablet Commonly known as: NORVASC   fosfomycin 3 g  Pack Commonly known as: MONUROL   Trilipix 135 MG capsule Generic drug: Choline Fenofibrate Replaced by: fenofibrate 160 MG tablet     TAKE these medications   acetaminophen 325 MG tablet Commonly known as: TYLENOL Take 650 mg by mouth every 6 (six) hours as needed for mild pain or moderate pain.   aspirin 81 MG chewable tablet Chew 1 tablet (81 mg total) by mouth daily.   carboxymethylcellulose 0.5 % Soln Commonly known as: REFRESH PLUS Place 1 drop into both eyes every 12 (twelve) hours as needed (dry eyes).   clonazePAM 0.5 MG tablet Commonly known as: KLONOPIN Take 1 tablet (0.5 mg total) by mouth 4 (four) times daily.   Coenzyme Q10 200 MG capsule Take 200 mg by mouth daily.   fenofibrate 160 MG tablet Take 1 tablet (160 mg total) by mouth daily. Replaces: Trilipix 135 MG capsule   isosorbide mononitrate 30 MG 24 hr tablet Commonly known as: IMDUR Take 1 tablet (30 mg total) by mouth daily. What changed:  medication strength  how much to take   losartan 50 MG tablet Commonly known as: COZAAR Take 1 tablet (50 mg total) by mouth daily.   magnesium oxide 400 (241.3 Mg) MG tablet Commonly known as: MAG-OX Take 1 tablet (400 mg total) by mouth daily.   metoprolol tartrate 50 MG tablet Commonly known as: LOPRESSOR Take 1 tablet (50 mg total) by mouth 2 (two) times daily.   nitroGLYCERIN 0.4 MG SL tablet Commonly known as: NITROSTAT Place 1 tablet (0.4 mg total) under the tongue every 5 (five) minutes as needed for chest pain (up to 3 doses). Do not give if blood pressure is low (less than 854 systolic). What changed:   when to take this  reasons to take this  additional instructions   NovoLOG FlexPen 100 UNIT/ML FlexPen Generic drug: insulin aspart Inject 1-9 Units into the skin See admin instructions. Inject as per sliding scale: If 70-120= 0 units; 121-150= 1 unit; 151-200= 2 units; 201-250= 3 units; 251-300= 5 units; 301-350= 7 units; 351-400= 9  units GREATER THAN 400 CALL MD, subcutaneously before meals for DM What changed: Another medication with the same name was removed. Continue taking this medication, and follow the directions you see here.   nystatin powder Commonly known as: MYCOSTATIN/NYSTOP Apply topically 3 (three) times daily for 5 days. To groins   glucose blood test strip 1 each by Other route as needed.   ONE TOUCH ULTRA TEST test strip Generic drug: glucose blood 1 each by Other route daily.   OneTouch Delica Plus [id number] Misc 1 each by Other route daily.   polyethylene glycol 17 g packet Commonly known as: MIRALAX / GLYCOLAX Take 17 g by mouth daily as needed for mild constipation.   ranolazine 500 MG 12 hr tablet Commonly known as: RANEXA Take 1 tablet (500 mg total) by mouth 2 (two) times daily.   rosuvastatin 20 MG tablet Commonly known as: CRESTOR Take 1 tablet (20 mg total) by mouth daily. What changed: when to take this      Allergies  Allergen Reactions  . Macrodantin [Nitrofurantoin] Other (See Comments)    "blocked kidneys"   . Tape Other (See Comments)    "Tears my skin and leaves marks." PLEASE USE COBAN!!  . Sulfa Antibiotics Rash  . Lisinopril Cough  . Nutritional Supplements Other (See Comments)    Protein drink given to me "did something"  . Penicillins Other (See Comments)    08/30/19- Patient can't remember, was 35+ years ago. OK with trying cephalexin in hospital    Follow-up Information    Tisovec, Fransico Him, MD. Call in 1 day(s).   Specialty: Internal Medicine Why: PLease call for a post hospital follow up appointment Contact information: [place] [address] [phone]        Deboraha Sprang, MD .   Specialty: Cardiology Contact information: [address] [address] [phone]        Eileen Stanford, PA-C Follow up.   Specialties: Cardiology, Radiology Why: Follow-up scheduled for 10/16/2019 at  2:30pm. Please arrive 15 minutes early for check-in. Contact information: [address] [place] [address] [phone]        Alexis Frock, MD. Call in 1 day(s).   Specialty: Urology Why: Please call for a post hospital follow up appointment Contact information: [place] [address] [phone]  The results of significant diagnostics from this hospitalization (including imaging, microbiology, ancillary and laboratory) are listed below for reference.    Significant Diagnostic Studies: DG Chest 2 View  Result Date: 10/01/2019 CLINICAL DATA:  Chest pain EXAM: CHEST - 2 VIEW COMPARISON:  September 19, 2019 FINDINGS: There is mild cardiomegaly. Aortic valve stent is noted. Aortic knob calcifications are seen. Both lungs are clear. No pleural effusion. No acute osseous abnormality is seen. IMPRESSION: No active cardiopulmonary disease. Electronically Signed   By: Prudencio Pair M.D.   On: 10/01/2019 00:49   DG Chest Port 1 View  Result Date: 09/19/2019 CLINICAL DATA:  79 year old male with chest pain. EXAM: PORTABLE CHEST 1 VIEW COMPARISON:  Chest radiograph dated 09/08/2019. FINDINGS: There are bibasilar linear atelectasis/scarring. No focal consolidation, pleural effusion, or pneumothorax. Probable background of mild emphysema. There is mild cardiomegaly. Coronary vascular calcification and aortic valve repair. Atherosclerotic calcification of the aortic arch. No acute osseous pathology. IMPRESSION: 1. No acute cardiopulmonary process. 2. Mild cardiomegaly. Electronically Signed   By: Anner Crete M.D.   On: 09/19/2019 15:16   DG Abd 2 Views  Result Date: 10/01/2019 CLINICAL DATA:  Abdominal pain EXAM: ABDOMEN - 2 VIEW COMPARISON:  CT, 08/24/2019. FINDINGS: Normal bowel gas pattern. Scattered aortoiliac atherosclerotic calcifications. No evidence of renal or ureteral stones. Soft tissues otherwise unremarkable. No acute skeletal  abnormality. IMPRESSION: 1. No acute findings.  Normal bowel gas pattern. Electronically Signed   By: Lajean Manes M.D.   On: 10/01/2019 05:14   ECHOCARDIOGRAM COMPLETE  Result Date: 10/02/2019    ECHOCARDIOGRAM REPORT   Patient Name:   ORREN PIETSCH Date of Exam: 10/02/2019 Medical Rec #:  [id number]      Height:       70.0 in Accession #:    [id number]     Weight:       181.9 lb Date of Birth:  [birth year]      BSA:          2.005 m Patient Age:    66 years       BP:           131/57 mmHg Patient Gender: M              HR:           65 bpm. Exam Location:  Inpatient Procedure: 2D Echo, 3D Echo and Strain Analysis                                MODIFIED REPORT:    This report was modified by Cherlynn Kaiser MD on 10/02/2019 due to report                                    revision.  Indications:     Chest Pain 786.50 / R07.9                  Syncope 780.2 / R55  History:         Patient has prior history of Echocardiogram examinations, most                  recent 09/12/2019. CHF, CAD and Previous Myocardial Infarction,                  Carotid Disease, Arrythmias:PVC, Signs/Symptoms:Chest Pain and  Hypotension; Risk Factors:Sleep Apnea.                  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 Procedure Date:                  08/15/2019.  Sonographer:     Darlina Sicilian RDCS Referring Phys:  [id number] Darreld Mclean Diagnosing Phys: Cherlynn Kaiser MD IMPRESSIONS  1. Left ventricular ejection fraction, by estimation, is 60 to 65%. The left ventricle has normal function. The left ventricle has no regional wall motion abnormalities. There is moderate asymmetric left ventricular hypertrophy of the septal segment. Left ventricular diastolic parameters are consistent with Grade I diastolic dysfunction (impaired relaxation).  2. Right ventricular systolic function is normal. The right ventricular size is normal.  3. The mitral  valve is normal in structure. Trivial mitral valve regurgitation. No evidence of mitral stenosis.  4. The aortic valve has been repaired/replaced. Aortic valve regurgitation No paravalvular or valvular regurgitation. 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 Procedure Date: 08/15/2019. Echo findings are consistent with normal structure and function of the aortic valve prosthesis. Aortic valve mean gradient measures 13.0 mmHg.  5. The inferior vena cava is normal in size with greater than 50% respiratory variability, suggesting right atrial pressure of 3 mmHg. Comparison(s): Stable prosthetic aortic valve systolic gradient compared to 09/12/19. FINDINGS  Left Ventricle: Left ventricular ejection fraction, by estimation, is 60 to 65%. The left ventricle has normal function. The left ventricle has no regional wall motion abnormalities. The left ventricular internal cavity size was normal in size. There is  moderate asymmetric left ventricular hypertrophy of the septal segment. Left ventricular diastolic parameters are consistent with Grade I diastolic dysfunction (impaired relaxation). Right Ventricle: The right ventricular size is normal. No increase in right ventricular wall thickness. Right ventricular systolic function is normal. Left Atrium: Left atrial size was normal in size. Right Atrium: Right atrial size was normal in size. Pericardium: There is no evidence of pericardial effusion. Mitral Valve: The mitral valve is normal in structure. Normal mobility of the mitral valve leaflets. Trivial mitral valve regurgitation. No evidence of mitral valve stenosis. Tricuspid Valve: The tricuspid valve is normal in structure. Tricuspid valve regurgitation is trivial. No evidence of tricuspid stenosis. Aortic Valve: The aortic valve has been repaired/replaced. Aortic valve regurgitation No paravalvular or valvular regurgitation. Aortic valve mean gradient measures 13.0 mmHg.  Aortic valve peak gradient measures 23.3 mmHg. Aortic valve area, by VTI measures 1.79 cm. 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 Procedure Date: 08/15/2019. Echo findings are consistent with normal structure and function of the aortic valve prosthesis. Pulmonic Valve: The pulmonic valve was normal in structure. Pulmonic valve regurgitation is trivial. No evidence of pulmonic stenosis. Aorta: The aortic root is normal in size and structure. Venous: The inferior vena cava is normal in size with greater than 50% respiratory variability, suggesting right atrial pressure of 3 mmHg. IAS/Shunts: No atrial level shunt detected by color flow Doppler.  LEFT VENTRICLE PLAX 2D LVIDd:         5.10 cm      Diastology LVIDs:         3.20 cm      LV e' lateral:  7.13 cm/s LV PW:         0.90 cm      LV E/e' lateral: 10.9 LV IVS:        1.40 cm      LV e' medial:    4.80 cm/s LVOT diam:     2.50 cm      LV E/e' medial:  16.2 LV SV:         87 LV SV Index:   43 LVOT Area:     4.91 cm  LV Volumes (MOD) LV vol d, MOD A2C: 134.0 ml LV vol d, MOD A4C: 114.0 ml LV vol s, MOD A2C: 54.1 ml LV vol s, MOD A4C: 59.2 ml LV SV MOD A2C:     79.9 ml LV SV MOD A4C:     114.0 ml LV SV MOD BP:      69.3 ml RIGHT VENTRICLE RV S prime:     16.80 cm/s TAPSE (M-mode): 2.5 cm LEFT ATRIUM             Index       RIGHT ATRIUM           Index LA diam:        3.80 cm 1.90 cm/m  RA Area:     12.20 cm LA Vol (A2C):   43.8 ml 21.85 ml/m RA Volume:   23.40 ml  11.67 ml/m LA Vol (A4C):   41.1 ml 20.50 ml/m LA Biplane Vol: 45.1 ml 22.50 ml/m  AORTIC VALVE AV Area (Vmax):    1.75 cm AV Area (Vmean):   1.74 cm AV Area (VTI):     1.79 cm AV Vmax:           241.50 cm/s AV Vmean:          167.750 cm/s AV VTI:            0.484 m AV Peak Grad:      23.3 mmHg AV Mean Grad:      13.0 mmHg LVOT Vmax:         86.20 cm/s LVOT Vmean:        59.300 cm/s LVOT VTI:          0.177 m LVOT/AV VTI ratio: 0.37  AORTA  Ao Asc diam: 3.40 cm MITRAL VALVE MV Area (PHT): 2.29 cm    SHUNTS MV Decel Time: 331 msec    Systemic VTI:  0.18 m MV E velocity: 77.60 cm/s  Systemic Diam: 2.50 cm MV A velocity: 90.80 cm/s MV E/A ratio:  0.85 Cherlynn Kaiser MD Electronically signed by Cherlynn Kaiser MD Signature Date/Time: 10/02/2019/7:46:50 PM    Final (Updated)    ECHOCARDIOGRAM LIMITED  Result Date: 09/12/2019    ECHOCARDIOGRAM LIMITED REPORT   Patient Name:   THAYER EMBLETON Date of Exam: 09/10/2019 Medical Rec #:  [id number]      Height:       70.0 in Accession #:    [id number]     Weight:       188.5 lb Date of Birth:  30-Dec-1940      BSA:          2.035 m Patient Age:    51 years       BP:           161/72 mmHg Patient Gender: M              HR:           74 bpm.  Exam Location:  Inpatient Procedure: Limited Echo, Limited Color Doppler and Cardiac Doppler Indications:    ACS/S/P TAVR 1 month  History:        Patient has prior history of Echocardiogram examinations, most                 recent 08/16/2019.                 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 Procedure Date:                 08/15/2019.  Sonographer:    Clayton Lefort RDCS (AE) Referring Phys: [id number] CARRIEL T NIPP IMPRESSIONS  1. Stable findings 1 month post TAVR, normal function of the bioprosthetic valve, mean transaortic gradient 12 mmHg, no paravalvular leak.  2. Left ventricular ejection fraction, by estimation, is 60 to 65%. The left ventricle has normal function. The left ventricle has no regional wall motion abnormalities. There is moderate concentric left ventricular hypertrophy. Left ventricular diastolic parameters are consistent with Grade I diastolic dysfunction (impaired relaxation). Elevated left atrial pressure.  3. Right ventricular systolic function is normal. The right ventricular size is normal.  4. The mitral valve is normal in structure. Trivial mitral valve regurgitation. No  evidence of mitral stenosis.  5. The aortic valve has been repaired/replaced. Aortic valve regurgitation is not visualized. No aortic stenosis is present. 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 Procedure Date: 08/15/2019. Echo findings  are consistent with normal structure and function of the aortic valve prosthesis.  6. The inferior vena cava is normal in size with greater than 50% respiratory variability, suggesting right atrial pressure of 3 mmHg. FINDINGS  Left Ventricle: Left ventricular ejection fraction, by estimation, is 60 to 65%. The left ventricle has normal function. The left ventricle has no regional wall motion abnormalities. The left ventricular internal cavity size was normal in size. 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 Elevated left atrial pressure. Right Ventricle: The right ventricular size is normal. No increase in right ventricular wall thickness. Right ventricular systolic function is normal. Left Atrium: Left atrial size was normal in size. Right Atrium: Right atrial size was normal in size. Pericardium: There is no evidence of pericardial effusion. Mitral Valve: The mitral valve is normal in structure. Normal mobility of the mitral valve leaflets. Trivial mitral valve regurgitation. No evidence of mitral valve stenosis. Tricuspid Valve: The tricuspid valve is normal in structure. Tricuspid valve regurgitation is mild . No evidence of tricuspid stenosis. Aortic Valve: The aortic valve has been repaired/replaced. Aortic valve regurgitation is not visualized. No aortic stenosis is present. Aortic valve mean gradient measures 12.0 mmHg. Aortic valve peak gradient measures 23.4 mmHg. Aortic valve area, by VTI measures 1.96 cm. 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 Procedure Date: 08/15/2019. Echo findings are consistent with normal structure and function of the aortic  valve prosthesis. Pulmonic Valve: The pulmonic valve was normal in structure. Pulmonic valve regurgitation is not visualized. No evidence of pulmonic stenosis. Aorta: The aortic root is normal in size and structure. Venous: The inferior vena cava is normal in size with greater than 50% respiratory variability, suggesting right atrial pressure of 3 mmHg. IAS/Shunts: No atrial level shunt detected by color flow  Doppler.  LEFT VENTRICLE PLAX 2D LVIDd:         3.80 cm  Diastology LVIDs:         3.40 cm  LV e' lateral:   6.90 cm/s LV PW:         1.70 cm  LV E/e' lateral: 8.9 LV IVS:        1.80 cm  LV e' medial:    4.20 cm/s LVOT diam:     2.60 cm  LV E/e' medial:  14.7 LV SV:         87 LV SV Index:   43 LVOT Area:     5.31 cm  IVC IVC diam: 1.60 cm LEFT ATRIUM         Index LA diam:    3.20 cm 1.57 cm/m  AORTIC VALVE AV Area (Vmax):    2.03 cm AV Area (Vmean):   2.11 cm AV Area (VTI):     1.96 cm AV Vmax:           242.00 cm/s AV Vmean:          159.000 cm/s AV VTI:            0.442 m AV Peak Grad:      23.4 mmHg AV Mean Grad:      12.0 mmHg LVOT Vmax:         92.70 cm/s LVOT Vmean:        63.100 cm/s LVOT VTI:          0.163 m LVOT/AV VTI ratio: 0.37  AORTA Ao Root diam: 3.40 cm Ao Asc diam:  3.10 cm MITRAL VALVE MV Area (PHT): 2.37 cm    SHUNTS MV Decel Time: 320 msec    Systemic VTI:  0.16 m MV E velocity: 61.70 cm/s  Systemic Diam: 2.60 cm MV A velocity: 88.30 cm/s MV E/A ratio:  0.70 Ena Dawley MD Electronically signed by Ena Dawley MD Signature Date/Time: 09/12/2019/10:42:31 AM    Final     Microbiology: Recent Results (from the past 240 hour(s))  SARS Coronavirus 2 by RT PCR (hospital order, performed in [facility] hospital lab) Nasopharyngeal Nasopharyngeal Swab     Status: None   Collection Time: 10/01/19  2:32 AM   Specimen: Nasopharyngeal Swab  Result Value Ref Range Status   SARS Coronavirus 2 NEGATIVE NEGATIVE Final    Comment: (NOTE) SARS-CoV-2 target nucleic acids are NOT  DETECTED.  The SARS-CoV-2 RNA is generally detectable in upper and lower respiratory specimens during the acute phase of infection. The lowest concentration of SARS-CoV-2 viral copies this assay can detect is 250 copies / mL. A negative result does not preclude SARS-CoV-2 infection and should not be used as the sole basis for treatment or other patient management decisions.  A negative result may occur with improper specimen collection / handling, submission of specimen other than nasopharyngeal swab, presence of viral mutation(s) within the areas targeted by this assay, and inadequate number of viral copies (<250 copies / mL). A negative result must be combined with clinical observations, patient history, and epidemiological information.  Fact Sheet for Patients:   StrictlyIdeas.no  Fact Sheet for Healthcare Providers: BankingDealers.co.za  This test is not yet approved or  cleared by the [place] FDA and has been authorized for detection and/or diagnosis of SARS-CoV-2 by FDA under an Emergency Use Authorization (EUA).  This EUA will remain in effect (meaning this test can be used) for the duration of the COVID-19 declaration  under Section 564(b)(1) of the Act, 21 U.S.C. section 360bbb-3(b)(1), unless the authorization is terminated or revoked sooner.  Performed at [facility], [place] [address] [place], [place] [address]   SARS CORONAVIRUS 2 (TAT 6-24 HRS) Nasopharyngeal Nasopharyngeal Swab     Status: None   Collection Time: 10/08/19  8:08 PM   Specimen: Nasopharyngeal Swab  Result Value Ref Range Status   SARS Coronavirus 2 NEGATIVE NEGATIVE Final    Comment: (NOTE) SARS-CoV-2 target nucleic acids are NOT DETECTED.  The SARS-CoV-2 RNA is generally detectable in upper and lower respiratory specimens during the acute phase of infection. Negative results do not preclude SARS-CoV-2 infection, do not rule  out co-infections with other pathogens, and should not be used as the sole basis for treatment or other patient management decisions. Negative results must be combined with clinical observations, patient history, and epidemiological information. The expected result is Negative.  Fact Sheet for Patients: SugarRoll.be  Fact Sheet for Healthcare Providers: https://www.woods-mathews.com/  This test is not yet approved or cleared by the [place] FDA and  has been authorized for detection and/or diagnosis of SARS-CoV-2 by FDA under an Emergency Use Authorization (EUA). This EUA will remain  in effect (meaning this test can be used) for the duration of the COVID-19 declaration under Se ction 564(b)(1) of the Act, 21 U.S.C. section 360bbb-3(b)(1), unless the authorization is terminated or revoked sooner.  Performed at [facility], [place] [address], [place], [place] [address]      Labs: Basic Metabolic Panel: Recent Labs  Lab 10/08/19 0651  NA 132*  K 3.8  CL 98  CO2 23  GLUCOSE 171*  BUN 14  CREATININE 1.18  CALCIUM 9.6  MG 1.7   Liver Function Tests: No results for input(s): AST, ALT, ALKPHOS, BILITOT, PROT, ALBUMIN in the last 168 hours. No results for input(s): LIPASE, AMYLASE in the last 168 hours. No results for input(s): AMMONIA in the last 168 hours. CBC: No results for input(s): WBC, NEUTROABS, HGB, HCT, MCV, PLT in the last 168 hours. Cardiac Enzymes: No results for input(s): CKTOTAL, CKMB, CKMBINDEX, TROPONINI in the last 168 hours. BNP: BNP (last 3 results) Recent Labs    08/11/19 0844 09/08/19 2139 09/09/19 0308  BNP 238.5* 222.2* 326.2*    ProBNP (last 3 results) No results for input(s): PROBNP in the last 8760 hours.  CBG: Recent Labs  Lab 10/09/19 1213 10/09/19 1621 10/09/19 2102 10/10/19 0602 10/10/19 1104  GLUCAP 185* 172* 226* 150* 252*       Signed:  Kayleen Memos, MD [facility] 10/10/2019, 3:15 PM

## 2019-10-10 NOTE — Progress Notes (Signed)
Discharge Summary  Alexis Griffith MVE:[id number] DOB: [birth year]  PCP: Haywood Pao, MD  Admit date: 09/30/2019 Discharge date: 10/10/2019  Time spent: 35 minutes  Recommendations for Outpatient Follow-up:  1. Follow-up with cardiology 2. Follow-up with your urologist 3. Follow-up with your primary care provider 4. Take your medications as described 5. Continue PT OT with assistance and fall precautions.  Recommendations per cardiology: Medication Recommendations:  Ranexa 500mg  BID, ASA 81mg  daily, fenofibrate 160mg  daily, Imdur 30mg  daily, Losartan 50mg  daily, Lopressor 50mg  BID and Crestor 20mg  daily.   Other recommendations (labs, testing, etc):  event monitor Follow up as an outpatient:  F/U with [facility]>>appt on 10/16/19  Discharge Diagnoses:  Active Hospital Problems   Diagnosis Date Noted  . Syncope   . Syncope and collapse 10/01/2019  . CHF (congestive heart failure) ([place]) 09/25/2019  . Orthostatic hypotension 09/11/2019  . Urinary retention 09/11/2019  . Chest pain   . 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 Diabetes mellitus type 2 in nonobese (HCC)   . Severe aortic stenosis   . Coronary artery disease involving native coronary artery of native heart with angina pectoris ([place]) 07/04/2019  . Carotid artery disease ([place]) 08/24/2011  . Labile hypertension     Resolved Hospital Problems  No resolved problems to display.    Discharge Condition: Stable  Diet recommendation: Heart healthy carb modified diet  Vitals:   10/10/19 0504 10/10/19 1103  BP: (!) 142/58 (!) 153/58  Pulse: (!) 58 70  Resp: 18 18  Temp: 97.8 F (36.6 C) (!) 97.5 F (36.4 C)  SpO2: 97% 96%    History of present illness:  Alexis Griffith a 79 y.o.malewith medical history significant ofCAD, stroke, AS s/p AVR, renal mass with concern for renal carcinoma, orthostatic hypotension and recurrent syncope, HTN, HLD, h/o GCA, DM2, HFpEF (TTE 09/10/19  showed normal EF with a Grade 1 diastolic dysfunction and functional AVR), carotid artery disease, urinary retention with chronic foley and chronic anxiety who presents for chest pain. The patient reports that on Saturday (day of admission) patient developed a tightening up of his chest. It was similar feeling to his previous NSTEMI, but not as bad. About a 5/10. Pain did not radiate. He is currently at [facility] for rehab and he notes that he gets very nervous on the weekends as they have lower staff and he is concerned something will happen to him. He is on 4X per day anxiolytic at this time. He was brought to the ED and after triage was placed in the waiting room. He reports at that time having some issues with anxiety as well given he was surrounded by people and he was worried about how long it would take to be seen. He developed dizziness and then does not remember anything prior to being in his ED room. Per report from EDP, patient briefly lost consciousness and was reported that he lost a pulse, though this was for a brief time. He awoke upon being brought into triage. He has a history of recurrent syncope due to orthostatic hypotension and has at times been taken off many of his blood pressure medications. Further symptoms include discomfort from the foley catheter (has had to cancel follow up with urology 3X to do voiding trials) and some LLQ pain which he is not sure if coming from his bladder or bowels. He denies constipation. He denies heartburn, vomiting, diarrhea, fever, chills, recent illness.   Review of recent hospitalizations:  08/15/19 --> 08/31/19 TAVR on 08/15/19. Hospital course complicated by a peri procedural small infarct of the left medial frontal cortex, recurrent syncope due to orthostasis (only on metoprolol on discharge), acute urinary retention requiring coude catheterization and follow up with Urology, UTI and epididymoorchitis for which he was treated with  fosfomycin and pulmonary nodules found on PET scan.   09/08/19 --> 09/12/19 Presented for Chest pain, found to have NSTEMI. He was deemed not to be interventional at that time due to urinary bleeding requiring cessation of plavix previously, so plan was for medical treatment. Continue ASA, statin titration, anti-anginals including amlodipine and IMDUR. Foley was in place for urinary retention. He is noted to have renal cell carcinoma that is well known and following with Urology. Patient was discharged to [place] home.   09/19/19 --> 09/28/19 Admission for chest pain again. Troponins did not rise >100. He was managed with home medications. BP was labile with highs and lows. Noted to have issues with orthostasis. Discharged back to [facility].   7/31--> 10/10/19  Elevated troponin S peaked at 27.  Episode of chest pain the night of 10/01/19 and 10/02/19.  Cardiac medications adjusted by cardiology..  10/03/19: Cardiology ordered 30-day event monitor for further evaluation of syncope.  Signed off.  10/10/19: No acute events.  Awaiting insurance authorization.    Hospital Course:  Active Problems:   Labile hypertension   Carotid artery disease (HCC)   Coronary artery disease involving native coronary artery of native heart with angina pectoris (HCC)   Diabetes mellitus type 2 in nonobese (HCC)   Severe aortic stenosis   S/P TAVR (transcatheter aortic valve replacement)   Chest pain   Orthostatic hypotension   Urinary retention   CHF (congestive heart failure) (HCC)   Syncope and collapse   Syncope   Syncope, likely orthostatic in origin -Initially positive orthostatic vital signs -Cardiology will add thigh-high compression hose and will order event monitor as outpatient to evaluate for arrhythmias. -TAVR stable on 2D echo. -Follow-up with cardiology outpatient.  Essential hypertension -Medications adjusted by cardiology Recommendations as stated above.  Resolved Chest  pain, ruled out ACS Coronary artery disease involving native coronary artery of native heart with angina pectoris - He has had an extensive course of hospitalizations recently, worsened by anxiety.  Ranexa added by cardiology -Cardiac medications as noted above  Left renal mass with concern for malignancy Will need to keep his appointment follow up with urology Dr. Tresa Moore to discuss plan  Pulmonary nodules Will need follow up outpatient   Chronic anxiety Resume prior to admission regimen Follow up with your PCP  Resolved LLQ abdominal pain Abdominal x-ray with no acute findings.  Carotid artery disease (HCC) Right carotid artery stenosis, 40 to 59% on carotid ultrasound done on 08/17/2019. 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  Hypomagnesemia Serum magnesium 1.7 Repleted intravenously Continue oral magnesium supplementation.  Ambulatory  dysfunction PT OT recommended SNF Continue PT OT with assistance and fall precautions.  Skin candidiasis affecting groins Continue nystatin powder 3 times daily x5 days.    Code Status:Full  Discharge Exam: BP (!) 153/58 (BP Location: Right Arm)   Pulse 70   Temp (!) 97.5 F (36.4 C) (Oral)   Resp 18   Ht 5\' 10"  (1.778 m)   Wt 80.8 kg   SpO2 96%    BMI 25.57 kg/m  . General: 79 y.o. year-old male pleasant well-developed well-nourished in no acute stress.  Alert and oriented x3.   . Cardiovascular: Regular rate and rhythm no rubs or gallops.  No JVD or thyromegaly noted.   Marland Kitchen Respiratory: Clear to auscultation no wheezes or rales.  Good inspiratory effort.   . Abdomen: Soft nontender normal bowel sounds present . Musculoskeletal: No lower extremity edema bilaterally.   Marland Kitchen Psychiatry: Mood is appropriate for condition and setting.  Discharge Instructions You were cared for by a hospitalist during your hospital stay. If you have any questions about your discharge medications or the care you received while you were in the hospital after you are discharged, you can call the unit and asked to speak with the hospitalist on call if the hospitalist that took care of you is not available. Once you are discharged, your primary care physician will handle any further medical issues. Please note that NO REFILLS for any discharge medications will be authorized once you are discharged, as it is imperative that you return to your primary care physician (or establish a relationship with a primary care physician if you do not have one) for your aftercare needs so that they can reassess your need for medications and monitor your lab values.   Allergies as of 10/10/2019      Reactions   Macrodantin [nitrofurantoin] Other (See Comments)   "blocked kidneys"    Tape Other (See Comments)   "Tears my skin and leaves marks." PLEASE USE COBAN!!   Sulfa Antibiotics Rash   Lisinopril Cough   Nutritional Supplements Other (See Comments)   Protein drink given to me "did something"   Penicillins Other (See Comments)   08/30/19- Patient can't remember, was 35+ years ago. OK with trying cephalexin in hospital      Medication List    STOP taking these medications   amLODipine 5 MG tablet Commonly known as: NORVASC   fosfomycin 3 g Pack Commonly known as: MONUROL    Trilipix 135 MG capsule Generic drug: Choline Fenofibrate Replaced by: fenofibrate 160 MG tablet     TAKE these medications   acetaminophen 325 MG tablet Commonly known as: TYLENOL Take 650 mg by mouth every 6 (six) hours as needed for mild pain or moderate pain.   aspirin 81 MG chewable tablet Chew 1 tablet (81 mg total) by mouth daily.   carboxymethylcellulose 0.5 % Soln Commonly known as: REFRESH PLUS Place 1 drop into both eyes every 12 (twelve) hours as needed (dry eyes).   clonazePAM 0.5 MG tablet Commonly known as: KLONOPIN Take 1 tablet (0.5 mg total) by mouth 4 (four) times daily.   Coenzyme Q10 200 MG capsule Take 200 mg by mouth daily.   fenofibrate 160 MG tablet Take 1 tablet (160 mg total) by mouth daily. Replaces: Trilipix 135 MG capsule   isosorbide mononitrate 30 MG 24 hr tablet Commonly known as: IMDUR Take 1 tablet (30 mg total) by mouth daily. What changed:   medication strength  how much to take  losartan 50 MG tablet Commonly known as: COZAAR Take 1 tablet (50 mg total) by mouth daily.   magnesium oxide 400 (241.3 Mg) MG tablet Commonly known as: MAG-OX Take 1 tablet (400 mg total) by mouth daily.   metoprolol tartrate 50 MG tablet Commonly known as: LOPRESSOR Take 1 tablet (50 mg total) by mouth 2 (two) times daily.   nitroGLYCERIN 0.4 MG SL tablet Commonly known as: NITROSTAT Place 1 tablet (0.4 mg total) under the tongue every 5 (five) minutes as needed for chest pain (up to 3 doses). Do not give if blood pressure is low (less than 268 systolic). What changed:   when to take this  reasons to take this  additional instructions   NovoLOG FlexPen 100 UNIT/ML FlexPen Generic drug: insulin aspart Inject 1-9 Units into the skin See admin instructions. Inject as per sliding scale: If 70-120= 0 units; 121-150= 1 unit; 151-200= 2 units; 201-250= 3 units; 251-300= 5 units; 301-350= 7 units; 351-400= 9 units GREATER THAN 400 CALL MD,  subcutaneously before meals for DM What changed: Another medication with the same name was removed. Continue taking this medication, and follow the directions you see here.   nystatin powder Commonly known as: MYCOSTATIN/NYSTOP Apply topically 3 (three) times daily for 5 days. To groins   glucose blood test strip 1 each by Other route as needed.   ONE TOUCH ULTRA TEST test strip Generic drug: glucose blood 1 each by Other route daily.   OneTouch Delica Plus TMHDQQ22L Misc 1 each by Other route daily.   polyethylene glycol 17 g packet Commonly known as: MIRALAX / GLYCOLAX Take 17 g by mouth daily as needed for mild constipation.   ranolazine 500 MG 12 hr tablet Commonly known as: RANEXA Take 1 tablet (500 mg total) by mouth 2 (two) times daily.   rosuvastatin 20 MG tablet Commonly known as: CRESTOR Take 1 tablet (20 mg total) by mouth daily. What changed: when to take this      Allergies  Allergen Reactions  . Macrodantin [Nitrofurantoin] Other (See Comments)    "blocked kidneys"   . Tape Other (See Comments)    "Tears my skin and leaves marks." PLEASE USE COBAN!!  . Sulfa Antibiotics Rash  . Lisinopril Cough  . Nutritional Supplements Other (See Comments)    Protein drink given to me "did something"  . Penicillins Other (See Comments)    08/30/19- Patient can't remember, was 35+ years ago. OK with trying cephalexin in hospital    Follow-up Information    Tisovec, Fransico Him, MD. Call in 1 day(s).   Specialty: Internal Medicine Why: PLease call for a post hospital follow up appointment Contact information: [place] [address] [phone]        Deboraha Sprang, MD .   Specialty: Cardiology Contact information: [address] [address] [phone]        Eileen Stanford, PA-C Follow up.   Specialties: Cardiology, Radiology Why: Follow-up scheduled for 10/16/2019 at 2:30pm. Please arrive 15 minutes  early for check-in. Contact information: [address] [place] [address] [phone]        Alexis Frock, MD. Call in 1 day(s).   Specialty: Urology Why: Please call for a post hospital follow up appointment Contact information: [place] [address] [phone]                The results of significant diagnostics from this  hospitalization (including imaging, microbiology, ancillary and laboratory) are listed below for reference.    Significant Diagnostic Studies: DG Chest 2 View  Result Date: 10/01/2019 CLINICAL DATA:  Chest pain EXAM: CHEST - 2 VIEW COMPARISON:  September 19, 2019 FINDINGS: There is mild cardiomegaly. Aortic valve stent is noted. Aortic knob calcifications are seen. Both lungs are clear. No pleural effusion. No acute osseous abnormality is seen. IMPRESSION: No active cardiopulmonary disease. Electronically Signed   By: Prudencio Pair M.D.   On: 10/01/2019 00:49   DG Chest Port 1 View  Result Date: 09/19/2019 CLINICAL DATA:  79 year old male with chest pain. EXAM: PORTABLE CHEST 1 VIEW COMPARISON:  Chest radiograph dated 09/08/2019. FINDINGS: There are bibasilar linear atelectasis/scarring. No focal consolidation, pleural effusion, or pneumothorax. Probable background of mild emphysema. There is mild cardiomegaly. Coronary vascular calcification and aortic valve repair. Atherosclerotic calcification of the aortic arch. No acute osseous pathology. IMPRESSION: 1. No acute cardiopulmonary process. 2. Mild cardiomegaly. Electronically Signed   By: Anner Crete M.D.   On: 09/19/2019 15:16   DG Abd 2 Views  Result Date: 10/01/2019 CLINICAL DATA:  Abdominal pain EXAM: ABDOMEN - 2 VIEW COMPARISON:  CT, 08/24/2019. FINDINGS: Normal bowel gas pattern. Scattered aortoiliac atherosclerotic calcifications. No evidence of renal or ureteral stones. Soft tissues otherwise unremarkable. No acute skeletal abnormality. IMPRESSION: 1. No acute  findings.  Normal bowel gas pattern. Electronically Signed   By: Lajean Manes M.D.   On: 10/01/2019 05:14   ECHOCARDIOGRAM COMPLETE  Result Date: 10/02/2019    ECHOCARDIOGRAM REPORT   Patient Name:   YAREL RUSHLOW Date of Exam: 10/02/2019 Medical Rec #:  [id number]      Height:       70.0 in Accession #:    [id number]     Weight:       181.9 lb Date of Birth:  06-Jul-1940      BSA:          2.005 m Patient Age:    65 years       BP:           131/57 mmHg Patient Gender: M              HR:           65 bpm. Exam Location:  Inpatient Procedure: 2D Echo, 3D Echo and Strain Analysis                                MODIFIED REPORT:    This report was modified by Cherlynn Kaiser MD on 10/02/2019 due to report                                    revision.  Indications:     Chest Pain 786.50 / R07.9                  Syncope 780.2 / R55  History:         Patient has prior history of Echocardiogram examinations, most                  recent 09/12/2019. CHF, CAD and Previous Myocardial Infarction,                  Carotid Disease, Arrythmias:PVC, Signs/Symptoms:Chest Pain and  Hypotension; Risk Factors:Sleep Apnea.                  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 Procedure Date:                  08/15/2019.  Sonographer:     Darlina Sicilian RDCS Referring Phys:  [id number] Darreld Mclean Diagnosing Phys: Cherlynn Kaiser MD IMPRESSIONS  1. Left ventricular ejection fraction, by estimation, is 60 to 65%. The left ventricle has normal function. The left ventricle has no regional wall motion abnormalities. There is moderate asymmetric left ventricular hypertrophy of the septal segment. Left ventricular diastolic parameters are consistent with Grade I diastolic dysfunction (impaired relaxation).  2. Right ventricular systolic function is normal. The right ventricular size is normal.  3. The mitral valve is normal in structure. Trivial  mitral valve regurgitation. No evidence of mitral stenosis.  4. The aortic valve has been repaired/replaced. Aortic valve regurgitation No paravalvular or valvular regurgitation. 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 Procedure Date: 08/15/2019. Echo findings are consistent with normal structure and function of the aortic valve prosthesis. Aortic valve mean gradient measures 13.0 mmHg.  5. The inferior vena cava is normal in size with greater than 50% respiratory variability, suggesting right atrial pressure of 3 mmHg. Comparison(s): Stable prosthetic aortic valve systolic gradient compared to 09/12/19. FINDINGS  Left Ventricle: Left ventricular ejection fraction, by estimation, is 60 to 65%. The left ventricle has normal function. The left ventricle has no regional wall motion abnormalities. The left ventricular internal cavity size was normal in size. There is  moderate asymmetric left ventricular hypertrophy of the septal segment. Left ventricular diastolic parameters are consistent with Grade I diastolic dysfunction (impaired relaxation). Right Ventricle: The right ventricular size is normal. No increase in right ventricular wall thickness. Right ventricular systolic function is normal. Left Atrium: Left atrial size was normal in size. Right Atrium: Right atrial size was normal in size. Pericardium: There is no evidence of pericardial effusion. Mitral Valve: The mitral valve is normal in structure. Normal mobility of the mitral valve leaflets. Trivial mitral valve regurgitation. No evidence of mitral valve stenosis. Tricuspid Valve: The tricuspid valve is normal in structure. Tricuspid valve regurgitation is trivial. No evidence of tricuspid stenosis. Aortic Valve: The aortic valve has been repaired/replaced. Aortic valve regurgitation No paravalvular or valvular regurgitation. Aortic valve mean gradient measures 13.0 mmHg. Aortic valve peak gradient measures 23.3  mmHg. Aortic valve area, by VTI measures 1.79 cm. 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 Procedure Date: 08/15/2019. Echo findings are consistent with normal structure and function of the aortic valve prosthesis. Pulmonic Valve: The pulmonic valve was normal in structure. Pulmonic valve regurgitation is trivial. No evidence of pulmonic stenosis. Aorta: The aortic root is normal in size and structure. Venous: The inferior vena cava is normal in size with greater than 50% respiratory variability, suggesting right atrial pressure of 3 mmHg. IAS/Shunts: No atrial level shunt detected by color flow Doppler.  LEFT VENTRICLE PLAX 2D LVIDd:         5.10 cm      Diastology LVIDs:         3.20 cm      LV e' lateral:  7.13 cm/s LV PW:         0.90 cm      LV E/e' lateral: 10.9 LV IVS:        1.40 cm      LV e' medial:    4.80 cm/s LVOT diam:     2.50 cm      LV E/e' medial:  16.2 LV SV:         87 LV SV Index:   43 LVOT Area:     4.91 cm  LV Volumes (MOD) LV vol d, MOD A2C: 134.0 ml LV vol d, MOD A4C: 114.0 ml LV vol s, MOD A2C: 54.1 ml LV vol s, MOD A4C: 59.2 ml LV SV MOD A2C:     79.9 ml LV SV MOD A4C:     114.0 ml LV SV MOD BP:      69.3 ml RIGHT VENTRICLE RV S prime:     16.80 cm/s TAPSE (M-mode): 2.5 cm LEFT ATRIUM             Index       RIGHT ATRIUM           Index LA diam:        3.80 cm 1.90 cm/m  RA Area:     12.20 cm LA Vol (A2C):   43.8 ml 21.85 ml/m RA Volume:   23.40 ml  11.67 ml/m LA Vol (A4C):   41.1 ml 20.50 ml/m LA Biplane Vol: 45.1 ml 22.50 ml/m  AORTIC VALVE AV Area (Vmax):    1.75 cm AV Area (Vmean):   1.74 cm AV Area (VTI):     1.79 cm AV Vmax:           241.50 cm/s AV Vmean:          167.750 cm/s AV VTI:            0.484 m AV Peak Grad:      23.3 mmHg AV Mean Grad:      13.0 mmHg LVOT Vmax:         86.20 cm/s LVOT Vmean:        59.300 cm/s LVOT VTI:          0.177 m LVOT/AV VTI ratio: 0.37  AORTA Ao Asc diam: 3.40 cm MITRAL VALVE MV  Area (PHT): 2.29 cm    SHUNTS MV Decel Time: 331 msec    Systemic VTI:  0.18 m MV E velocity: 77.60 cm/s  Systemic Diam: 2.50 cm MV A velocity: 90.80 cm/s MV E/A ratio:  0.85 Cherlynn Kaiser MD Electronically signed by Cherlynn Kaiser MD Signature Date/Time: 10/02/2019/7:46:50 PM    Final (Updated)    ECHOCARDIOGRAM LIMITED  Result Date: 09/12/2019    ECHOCARDIOGRAM LIMITED REPORT   Patient Name:   XAIDEN FLEIG Date of Exam: 09/10/2019 Medical Rec #:  [id number]      Height:       70.0 in Accession #:    [id number]     Weight:       188.5 lb Date of Birth:  1940-10-08      BSA:          2.035 m Patient Age:    73 years       BP:           161/72 mmHg Patient Gender: M              HR:           74 bpm.  Exam Location:  Inpatient Procedure: Limited Echo, Limited Color Doppler and Cardiac Doppler Indications:    ACS/S/P TAVR 1 month  History:        Patient has prior history of Echocardiogram examinations, most                 recent 08/16/2019.                 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 Procedure Date:                 08/15/2019.  Sonographer:    Clayton Lefort RDCS (AE) Referring Phys: [id number] CARRIEL T NIPP IMPRESSIONS  1. Stable findings 1 month post TAVR, normal function of the bioprosthetic valve, mean transaortic gradient 12 mmHg, no paravalvular leak.  2. Left ventricular ejection fraction, by estimation, is 60 to 65%. The left ventricle has normal function. The left ventricle has no regional wall motion abnormalities. There is moderate concentric left ventricular hypertrophy. Left ventricular diastolic parameters are consistent with Grade I diastolic dysfunction (impaired relaxation). Elevated left atrial pressure.  3. Right ventricular systolic function is normal. The right ventricular size is normal.  4. The mitral valve is normal in structure. Trivial mitral valve regurgitation. No evidence of mitral stenosis.  5. The  aortic valve has been repaired/replaced. Aortic valve regurgitation is not visualized. No aortic stenosis is present. 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 Procedure Date: 08/15/2019. Echo findings  are consistent with normal structure and function of the aortic valve prosthesis.  6. The inferior vena cava is normal in size with greater than 50% respiratory variability, suggesting right atrial pressure of 3 mmHg. FINDINGS  Left Ventricle: Left ventricular ejection fraction, by estimation, is 60 to 65%. The left ventricle has normal function. The left ventricle has no regional wall motion abnormalities. The left ventricular internal cavity size was normal in size. 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 Elevated left atrial pressure. Right Ventricle: The right ventricular size is normal. No increase in right ventricular wall thickness. Right ventricular systolic function is normal. Left Atrium: Left atrial size was normal in size. Right Atrium: Right atrial size was normal in size. Pericardium: There is no evidence of pericardial effusion. Mitral Valve: The mitral valve is normal in structure. Normal mobility of the mitral valve leaflets. Trivial mitral valve regurgitation. No evidence of mitral valve stenosis. Tricuspid Valve: The tricuspid valve is normal in structure. Tricuspid valve regurgitation is mild . No evidence of tricuspid stenosis. Aortic Valve: The aortic valve has been repaired/replaced. Aortic valve regurgitation is not visualized. No aortic stenosis is present. Aortic valve mean gradient measures 12.0 mmHg. Aortic valve peak gradient measures 23.4 mmHg. Aortic valve area, by VTI measures 1.96 cm. 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 Procedure Date: 08/15/2019. Echo findings are consistent with normal structure and function of the aortic valve prosthesis. Pulmonic Valve: The  pulmonic valve was normal in structure. Pulmonic valve regurgitation is not visualized. No evidence of pulmonic stenosis. Aorta: The aortic root is normal in size and structure. Venous: The inferior vena cava is normal in size with greater than 50% respiratory variability, suggesting right atrial pressure of 3 mmHg. IAS/Shunts: No atrial level shunt detected by color flow  Doppler.  LEFT VENTRICLE PLAX 2D LVIDd:         3.80 cm  Diastology LVIDs:         3.40 cm  LV e' lateral:   6.90 cm/s LV PW:         1.70 cm  LV E/e' lateral: 8.9 LV IVS:        1.80 cm  LV e' medial:    4.20 cm/s LVOT diam:     2.60 cm  LV E/e' medial:  14.7 LV SV:         87 LV SV Index:   43 LVOT Area:     5.31 cm  IVC IVC diam: 1.60 cm LEFT ATRIUM         Index LA diam:    3.20 cm 1.57 cm/m  AORTIC VALVE AV Area (Vmax):    2.03 cm AV Area (Vmean):   2.11 cm AV Area (VTI):     1.96 cm AV Vmax:           242.00 cm/s AV Vmean:          159.000 cm/s AV VTI:            0.442 m AV Peak Grad:      23.4 mmHg AV Mean Grad:      12.0 mmHg LVOT Vmax:         92.70 cm/s LVOT Vmean:        63.100 cm/s LVOT VTI:          0.163 m LVOT/AV VTI ratio: 0.37  AORTA Ao Root diam: 3.40 cm Ao Asc diam:  3.10 cm MITRAL VALVE MV Area (PHT): 2.37 cm    SHUNTS MV Decel Time: 320 msec    Systemic VTI:  0.16 m MV E velocity: 61.70 cm/s  Systemic Diam: 2.60 cm MV A velocity: 88.30 cm/s MV E/A ratio:  0.70 Ena Dawley MD Electronically signed by Ena Dawley MD Signature Date/Time: 09/12/2019/10:42:31 AM    Final     Microbiology: Recent Results (from the past 240 hour(s))  SARS Coronavirus 2 by RT PCR (hospital order, performed in [facility] hospital lab) Nasopharyngeal Nasopharyngeal Swab     Status: None   Collection Time: 10/01/19  2:32 AM   Specimen: Nasopharyngeal Swab  Result Value Ref Range Status   SARS Coronavirus 2 NEGATIVE NEGATIVE Final    Comment: (NOTE) SARS-CoV-2 target nucleic acids are NOT DETECTED.  The SARS-CoV-2 RNA is  generally detectable in upper and lower respiratory specimens during the acute phase of infection. The lowest concentration of SARS-CoV-2 viral copies this assay can detect is 250 copies / mL. A negative result does not preclude SARS-CoV-2 infection and should not be used as the sole basis for treatment or other patient management decisions.  A negative result may occur with improper specimen collection / handling, submission of specimen other than nasopharyngeal swab, presence of viral mutation(s) within the areas targeted by this assay, and inadequate number of viral copies (<250 copies / mL). A negative result must be combined with clinical observations, patient history, and epidemiological information.  Fact Sheet for Patients:   StrictlyIdeas.no  Fact Sheet for Healthcare Providers: BankingDealers.co.za  This test is not yet approved or  cleared by the [place] FDA and has been authorized for detection and/or diagnosis of SARS-CoV-2 by FDA under an Emergency Use Authorization (EUA).  This EUA will remain in effect (meaning this test can be used) for the duration of the COVID-19 declaration  under Section 564(b)(1) of the Act, 21 U.S.C. section 360bbb-3(b)(1), unless the authorization is terminated or revoked sooner.  Performed at [facility], [place] [address] [place], [place] [address]   SARS CORONAVIRUS 2 (TAT 6-24 HRS) Nasopharyngeal Nasopharyngeal Swab     Status: None   Collection Time: 10/08/19  8:08 PM   Specimen: Nasopharyngeal Swab  Result Value Ref Range Status   SARS Coronavirus 2 NEGATIVE NEGATIVE Final    Comment: (NOTE) SARS-CoV-2 target nucleic acids are NOT DETECTED.  The SARS-CoV-2 RNA is generally detectable in upper and lower respiratory specimens during the acute phase of infection. Negative results do not preclude SARS-CoV-2 infection, do not rule out co-infections with other pathogens, and should  not be used as the sole basis for treatment or other patient management decisions. Negative results must be combined with clinical observations, patient history, and epidemiological information. The expected result is Negative.  Fact Sheet for Patients: SugarRoll.be  Fact Sheet for Healthcare Providers: https://www.woods-mathews.com/  This test is not yet approved or cleared by the [place] FDA and  has been authorized for detection and/or diagnosis of SARS-CoV-2 by FDA under an Emergency Use Authorization (EUA). This EUA will remain  in effect (meaning this test can be used) for the duration of the COVID-19 declaration under Se ction 564(b)(1) of the Act, 21 U.S.C. section 360bbb-3(b)(1), unless the authorization is terminated or revoked sooner.  Performed at [facility], [place] [address] [place], [place] [address]      Labs: Basic Metabolic Panel: Recent Labs  Lab 10/08/19 0651  NA 132*  K 3.8  CL 98  CO2 23  GLUCOSE 171*  BUN 14  CREATININE 1.18  CALCIUM 9.6  MG 1.7   Liver Function Tests: No results for input(s): AST, ALT, ALKPHOS, BILITOT, PROT, ALBUMIN in the last 168 hours. No results for input(s): LIPASE, AMYLASE in the last 168 hours. No results for input(s): AMMONIA in the last 168 hours. CBC: No results for input(s): WBC, NEUTROABS, HGB, HCT, MCV, PLT in the last 168 hours. Cardiac Enzymes: No results for input(s): CKTOTAL, CKMB, CKMBINDEX, TROPONINI in the last 168 hours. BNP: BNP (last 3 results) Recent Labs    08/11/19 0844 09/08/19 2139 09/09/19 0308  BNP 238.5* 222.2* 326.2*    ProBNP (last 3 results) No results for input(s): PROBNP in the last 8760 hours.  CBG: Recent Labs  Lab 10/09/19 1213 10/09/19 1621 10/09/19 2102 10/10/19 0602 10/10/19 1104  GLUCAP 185* 172* 226* 150* 252*       Signed:  Kayleen Memos, MD [facility] 10/10/2019, 1:36 PM

## 2019-10-10 NOTE — Progress Notes (Signed)
Physical Therapy Treatment Patient Details Name: Alexis Griffith MRN: [id number] DOB: 1940/11/29 Today's Date: 10/10/2019    History of Present Illness SADIK PIASCIK is a 79 y.o. 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  Pt was d/c to SNF after recent hospitalizatin and was still there. He presented to ED with chest pain. Pt admitted with chest pain with known multivessel CAD.    PT Comments    Pt admitted with above diagnosis. Pt able to ambulate with and without device with pt doing best with device. Pt really wants rehab so that he will not have to use device when he goes home. He is definitely more steady with the device.  Will follow acutely. Pt currently with functional limitations due to balance and endurance deficits. Pt will benefit from skilled PT to increase their independence and safety with mobility to allow discharge to the venue listed below.     Follow Up Recommendations  SNF     Equipment Recommendations  None recommended by PT    Recommendations for Other Services       Precautions / Restrictions Precautions Precautions: Fall Precaution Comments: watch BP Restrictions Weight Bearing Restrictions: No    Mobility  Bed Mobility Overal bed mobility: Needs Assistance Bed Mobility: Sit to Supine;Supine to Sit Rolling: Supervision   Supine to sit: Min guard;HOB elevated Sit to supine: Min guard   General bed mobility comments: increased time/effort but no physical assist required   Transfers Overall transfer level: Needs assistance Equipment used: Rolling walker (2 wheeled) Transfers: Sit to/from Stand Sit to Stand: Supervision         General transfer comment: for safety, stood from recliner and toilet    Ambulation/Gait Ambulation/Gait assistance: Min guard Gait Distance (Feet): 400 Feet Assistive device: Rolling walker (2 wheeled);None Gait Pattern/deviations: Step-through pattern;Decreased stride length;Trunk flexed Gait velocity: .98 ft/sec Gait velocity interpretation: <1.31 ft/sec, indicative of household ambulator General Gait Details: Slow, mostly steady gait with flexed trunk. Fatigues. VSS on RA.  Walked with RW with supervision. without the RW, pt was min to min guard assist depending on challenges given. Scored 19/24 on DGI suggesting risk for falls without device.     Stairs Stairs: Yes Stairs assistance: Min guard Stair Management: One rail Right;Alternating pattern;Forwards Number of Stairs: 2     Wheelchair Mobility    Modified Rankin (Stroke Patients Only) Modified Rankin (Stroke Patients Only) Pre-Morbid Rankin Score: No significant disability Modified Rankin: Moderately severe disability     Balance Overall balance assessment: Needs assistance Sitting-balance support: Feet supported;No upper extremity supported Sitting balance-Leahy Scale: Good Sitting balance - Comments: supervision   Standing balance support: During functional activity;No upper extremity supported;Bilateral upper extremity supported Standing balance-Leahy Scale: Fair Standing balance comment: Needs UE support for dynamic tasks.                 Standardized Balance Assessment Standardized Balance Assessment : Dynamic Gait Index   Dynamic Gait Index Level Surface: Normal Change in Gait Speed: Mild Impairment Gait with Horizontal Head Turns: Normal Gait with Vertical Head Turns: Normal Gait and Pivot Turn: Mild Impairment Step Over Obstacle: Moderate Impairment Step Around Obstacles: Normal Steps: Mild Impairment Total Score: 19      Cognition Arousal/Alertness: Awake/alert Behavior During Therapy: WFL for tasks assessed/performed  Safety/Judgement: Decreased awareness of safety     General Comments: pt with decreased insight into current deficits. adamant to go to SNF however also adamant that he can be up in the room on his own without assist. attempted education as to reason for needing to have staff assist       Exercises General Exercises - Lower Extremity Ankle Circles/Pumps: AROM;5 reps Quad Sets: AROM;10 reps Long Arc Quad: AROM;Both;10 reps;Seated    General Comments        Pertinent Vitals/Pain Pain Assessment: No/denies pain    Home Living                      Prior Function            PT Goals (current goals can now be found in the care plan section) Acute Rehab PT Goals Patient Stated Goal: get back to independence with walking, finish rehab and go home Progress towards PT goals: Progressing toward goals    Frequency    Min 2X/week      PT Plan Current plan remains appropriate    Co-evaluation              AM-PAC PT "6 Clicks" Mobility   Outcome Measure  Help needed turning from your back to your side while in a flat bed without using bedrails?: None Help needed moving from lying on your back to sitting on the side of a flat bed without using bedrails?: None Help needed moving to and from a bed to a chair (including a wheelchair)?: None Help needed standing up from a chair using your arms (e.g., wheelchair or bedside chair)?: None Help needed to walk in hospital room?: A Little Help needed climbing 3-5 steps with a railing? : A Lot 6 Click Score: 21    End of Session Equipment Utilized During Treatment: Gait belt Activity Tolerance: Patient tolerated treatment well Patient left: in bed;with call bell/phone within reach;with bed alarm set Nurse Communication: Mobility status PT Visit Diagnosis: Other abnormalities of gait and mobility (R26.89);Muscle weakness (generalized) (M62.81);Other symptoms and signs involving the nervous system (R29.898)      Time: 2778-2423 PT Time Calculation (min) (ACUTE ONLY): 12 min  Charges:  $Gait Training: 8-22 mins                     Zaiya Annunziato W,PT [place] Pager:  [phone]  Office:  [place] 10/10/2019, 1:25 PM

## 2019-10-10 NOTE — TOC Transition Note (Signed)
Transition of Care [facility]) - CM/SW Discharge Note *Discharged to [place] *Number for Report: [phone]   Patient Details  Name: Alexis Griffith MRN: [id number] Date of Birth: February 28, 1941  [facility]) CM/SW Contact:  Sable Feil, LCSW Phone Number: 10/10/2019, 3:57 PM   Clinical Narrative:  Patient medically stable for discharge and insruance authorization received today.  *Auth# - [id number],  Irene Shipper. 8/10   *8/13 next review *Care coordinator - Percell Miller, admissions director at [facility] and insurance auth information provided.  Wife contacted and informed of receipt of insurance authorization and patient's discharge to [facility] today via ambulance. Nurse provided with information to call report.    Final next level of care: [place] ([facility]) Barriers to Discharge:  (Received insurance auth)   Patient Goals and CMS Choice Patient states their goals for this hospitalization and ongoing recovery are:: To go to [facility] for ST rehab, then return home CMS Medicare.gov Compare Post Acute Care list provided to:: Patient Represenative (must comment) (Wife Alexis Griffith) Choice offered to / list presented to : Patient, Spouse  Discharge Placement   Existing PASRR number confirmed : 10/03/19          Patient chooses bed at: [facility] Patient to be transferred to facility by: [facility] Name of family member notified: Alexis Griffith, spouse [phone]) Patient and family notified of of transfer: 10/10/19  Discharge Plan and Services In-house Referral: Clinical Social Work                                   Social Determinants of Health (SDOH) Interventions  No SDOH interventions requested or needed at discharge   Readmission Risk Interventions No flowsheet data found.

## 2019-10-10 NOTE — Progress Notes (Addendum)
Occupational Therapy Treatment Patient Details Name: Alexis Griffith MRN: [id number] DOB: October 30, 1940 Today's Date: 10/10/2019    History of present illness Alexis Griffith is a 79 y.o. 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  Pt was d/c to SNF after recent hospitalizatin and was still there. He presented to ED with chest pain. Pt admitted with chest pain with known multivessel CAD.   OT comments  Initially visited pt to ask about current discharge recommendations and thoughts on home vs SNF. Pt reports has been waiting to use bathroom and adamant to do so without staff assist in room therefore assisted with toileting ADL and return to bed during this session to ensure safety with mobility/ADL tasks. Pt completing room level mobility using RW at supervision level, requiring minA for toileting ADL and supervision for standing grooming ADL. Pt with concerns of returning home given spouse is not able to provide much assist, with preference for SNF. Feel pt will benefit from SNF to maximize his overall strength and independence with ADL and mobility prior to return home. Will continue to follow acutely.   Follow Up Recommendations  SNF;Supervision/Assistance - 24 hour    Equipment Recommendations  Tub/shower seat;Other (comment) (TBD)          Precautions / Restrictions Precautions Precautions: Fall Precaution Comments: watch BP       Mobility Bed Mobility Overal bed mobility: Needs Assistance Bed Mobility: Sit to Supine     Supine to sit: Min guard;HOB elevated Sit to supine: Min guard   General bed mobility comments: increased time/effort but no physical assist required   Transfers Overall transfer level: Needs  assistance Equipment used: Rolling walker (2 wheeled) Transfers: Sit to/from Stand Sit to Stand: Supervision         General transfer comment: for safety, stood from recliner and toilet     Balance Overall balance assessment: Needs assistance Sitting-balance support: Feet supported;No upper extremity supported Sitting balance-Leahy Scale: Good     Standing balance support: During functional activity;No upper extremity supported;Bilateral upper extremity supported Standing balance-Leahy Scale: Fair                             ADL either performed or assessed with clinical judgement   ADL Overall ADL's : Needs assistance/impaired     Grooming: Standing;Wash/dry hands;Supervision/safety               Toilet Transfer: Min guard;Ambulation;RW;Grab bars   Toileting- Clothing Manipulation and Hygiene: Minimal assistance;Sit to/from stand;Sitting/lateral lean Toileting - Clothing Manipulation Details (indicate cue type and reason): assist for gown management prior to sitting      Functional mobility during ADLs: Rolling walker;Supervision/safety General ADL Comments: pt seen to ask about thoughts on home vs SNF, pt with preference for SNF as his wife is not physically able to provide assist at home, pt adamant to walk to bathroom on his own as he has been waiting for assist for some time so assisted with toileting task and return to bed wihle in room                        Cognition Arousal/Alertness: Awake/alert Behavior During Therapy: [facility] for tasks assessed/performed  Safety/Judgement: Decreased awareness of safety     General Comments: pt with decreased insight into current deficits. adamant to go to SNF however also adamant that he can be up in the room on his own without assist. attempted education as to reason for needing to have staff assist         Exercises     Shoulder Instructions        General Comments      Pertinent Vitals/ Pain       Pain Assessment: No/denies pain  Home Living                                          Prior Functioning/Environment              Frequency  Min 2X/week        Progress Toward Goals  OT Goals(current goals can now be found in the care plan section)  Progress towards OT goals: Progressing toward goals  Acute Rehab OT Goals Patient Stated Goal: get back to independence with walking, finish rehab and go home OT Goal Formulation: With patient Time For Goal Achievement: 10/17/19 Potential to Achieve Goals: Good ADL Goals Pt Will Perform Grooming: with modified independence Pt Will Perform Upper Body Bathing: with modified independence Pt Will Perform Lower Body Bathing: with modified independence Pt Will Transfer to Toilet: with modified independence;regular height toilet  Plan Discharge plan remains appropriate    Co-evaluation                 AM-PAC OT "6 Clicks" Daily Activity     Outcome Measure   Help from another person eating meals?: None Help from another person taking care of personal grooming?: A Little Help from another person toileting, which includes using toliet, bedpan, or urinal?: A Little Help from another person bathing (including washing, rinsing, drying)?: A Little Help from another person to put on and taking off regular upper body clothing?: A Little Help from another person to put on and taking off regular lower body clothing?: A Little 6 Click Score: 19    End of Session Equipment Utilized During Treatment: Gait belt;Rolling walker  OT Visit Diagnosis: Unsteadiness on feet (R26.81);Muscle weakness (generalized) (M62.81)   Activity Tolerance Patient tolerated treatment well   Patient Left with chair alarm set;in bed;with bed alarm set   Nurse Communication Mobility status        Time: 7628-3151 OT Time Calculation (min): 17 min  Charges: OT General  Charges $OT Visit: 1 Visit OT Treatments $Self Care/Home Management : 8-22 mins  Lou Cal, OT Acute Rehabilitation Services Pager [phone] Office [phone]    [name] 10/10/2019, 10:05 AM

## 2019-10-12 ENCOUNTER — Encounter: Payer: Self-pay | Admitting: *Deleted

## 2019-10-16 ENCOUNTER — Telehealth: Payer: Self-pay

## 2019-10-16 ENCOUNTER — Telehealth (INDEPENDENT_AMBULATORY_CARE_PROVIDER_SITE_OTHER): Payer: Medicare Other | Admitting: Physician Assistant

## 2019-10-16 ENCOUNTER — Other Ambulatory Visit: Payer: Self-pay

## 2019-10-16 ENCOUNTER — Ambulatory Visit: Payer: Medicare Other | Admitting: Physician Assistant

## 2019-10-16 NOTE — Telephone Encounter (Signed)
  Patient Consent for Virtual Visit         Alexis Griffith has provided verbal consent on 10/16/2019 for a virtual visit (video or telephone).   CONSENT FOR VIRTUAL VISIT FOR:  Alexis Griffith  By participating in this virtual visit I agree to the following:  I hereby voluntarily request, consent and authorize [facility] and its employed or contracted physicians, physician assistants, nurse practitioners or other licensed health care professionals (the Practitioner), to provide me with telemedicine health care services (the "Services") as deemed necessary by the treating Practitioner. I acknowledge and consent to receive the Services by the Practitioner via telemedicine. I understand that the telemedicine visit will involve communicating with the Practitioner through live audiovisual communication technology and the disclosure of certain medical information by electronic transmission. I acknowledge that I have been given the opportunity to request an in-person assessment or other available alternative prior to the telemedicine visit and am voluntarily participating in the telemedicine visit.  I understand that I have the right to withhold or withdraw my consent to the use of telemedicine in the course of my care at any time, without affecting my right to future care or treatment, and that the Practitioner or I may terminate the telemedicine visit at any time. I understand that I have the right to inspect all information obtained and/or recorded in the course of the telemedicine visit and may receive copies of available information for a reasonable fee.  I understand that some of the potential risks of receiving the Services via telemedicine include:  Marland Kitchen Delay or interruption in medical evaluation due to technological equipment failure or disruption; . Information transmitted may not be sufficient (e.g. poor resolution of images) to allow for appropriate medical decision making by the Practitioner;  and/or  . In rare instances, security protocols could fail, causing a breach of personal health information.  Furthermore, I acknowledge that it is my responsibility to provide information about my medical history, conditions and care that is complete and accurate to the best of my ability. I acknowledge that Practitioner's advice, recommendations, and/or decision may be based on factors not within their control, such as incomplete or inaccurate data provided by me or distortions of diagnostic images or specimens that may result from electronic transmissions. I understand that the practice of medicine is not an exact science and that Practitioner makes no warranties or guarantees regarding treatment outcomes. I acknowledge that a copy of this consent can be made available to me via my patient portal ([facility]), or I can request a printed copy by calling the office of [facility].    I understand that my insurance will be billed for this visit.   I have read or had this consent read to me. . I understand the contents of this consent, which adequately explains the benefits and risks of the Services being provided via telemedicine.  . I have been provided ample opportunity to ask questions regarding this consent and the Services and have had my questions answered to my satisfaction. . I give my informed consent for the services to be provided through the use of telemedicine in my medical care

## 2019-10-16 NOTE — Telephone Encounter (Signed)
[facility] called back returning a call from our office. The Nursing Home has a cell phone designated for virtual visits. Our office should contact [facility] for the Virtual Visit at [phone]

## 2019-10-16 NOTE — Progress Notes (Signed)
Patient refused VV. R/s'd to next week.   Angelena Form PA-C  [facility]

## 2019-10-16 NOTE — Telephone Encounter (Signed)
Attempted to call patient to switch to virtual visit today as his provider is sick and cannot see patient in the office.  Left message to call back.  Attempted to call [facility] nursing facility but there was no answer after several rings.

## 2019-10-19 ENCOUNTER — Telehealth: Payer: Self-pay | Admitting: Physician Assistant

## 2019-10-19 NOTE — Telephone Encounter (Signed)
Claiborne Billings, Nursing Supervisor from [facility] wanted to make Angelena Form and the rest of the staff aware that the patient has refused to wear his 30 day event monitor. Please contact Claiborne Billings if needed

## 2019-10-19 NOTE — Telephone Encounter (Signed)
Noted. To K. Grandville Silos as Juluis Rainier.

## 2019-10-20 ENCOUNTER — Inpatient Hospital Stay (HOSPITAL_COMMUNITY)
Admission: EM | Admit: 2019-10-20 | Discharge: 2019-10-23 | DRG: 698 | Disposition: A | Discharge status: Home Health | Payer: Medicare Other | Source: Skilled Nursing Facility | Attending: Internal Medicine | Admitting: Internal Medicine

## 2019-10-20 ENCOUNTER — Emergency Department (HOSPITAL_COMMUNITY): Payer: Medicare Other

## 2019-10-20 DIAGNOSIS — T446X5A Adverse effect of alpha-adrenoreceptor antagonists, initial encounter: Secondary | ICD-10-CM | POA: Diagnosis present

## 2019-10-20 DIAGNOSIS — Z952 Presence of prosthetic heart valve: Secondary | ICD-10-CM

## 2019-10-20 DIAGNOSIS — N2889 Other specified disorders of kidney and ureter: Secondary | ICD-10-CM | POA: Diagnosis present

## 2019-10-20 DIAGNOSIS — Y929 Unspecified place or not applicable: Secondary | ICD-10-CM

## 2019-10-20 DIAGNOSIS — B372 Candidiasis of skin and nail: Secondary | ICD-10-CM | POA: Diagnosis present

## 2019-10-20 DIAGNOSIS — Z1611 Resistance to penicillins: Secondary | ICD-10-CM | POA: Diagnosis present

## 2019-10-20 DIAGNOSIS — N390 Urinary tract infection, site not specified: Secondary | ICD-10-CM | POA: Diagnosis not present

## 2019-10-20 DIAGNOSIS — I110 Hypertensive heart disease with heart failure: Secondary | ICD-10-CM | POA: Diagnosis present

## 2019-10-20 DIAGNOSIS — I6521 Occlusion and stenosis of right carotid artery: Secondary | ICD-10-CM | POA: Diagnosis present

## 2019-10-20 DIAGNOSIS — R778 Other specified abnormalities of plasma proteins: Secondary | ICD-10-CM | POA: Diagnosis present

## 2019-10-20 DIAGNOSIS — Z85810 Personal history of malignant neoplasm of tongue: Secondary | ICD-10-CM

## 2019-10-20 DIAGNOSIS — Z961 Presence of intraocular lens: Secondary | ICD-10-CM | POA: Diagnosis present

## 2019-10-20 DIAGNOSIS — T50995A Adverse effect of other drugs, medicaments and biological substances, initial encounter: Secondary | ICD-10-CM | POA: Diagnosis present

## 2019-10-20 DIAGNOSIS — I5032 Chronic diastolic (congestive) heart failure: Secondary | ICD-10-CM | POA: Diagnosis present

## 2019-10-20 DIAGNOSIS — Z8249 Family history of ischemic heart disease and other diseases of the circulatory system: Secondary | ICD-10-CM

## 2019-10-20 DIAGNOSIS — Z9841 Cataract extraction status, right eye: Secondary | ICD-10-CM

## 2019-10-20 DIAGNOSIS — E119 Type 2 diabetes mellitus without complications: Secondary | ICD-10-CM | POA: Diagnosis not present

## 2019-10-20 DIAGNOSIS — Z79899 Other long term (current) drug therapy: Secondary | ICD-10-CM | POA: Diagnosis not present

## 2019-10-20 DIAGNOSIS — W19XXXA Unspecified fall, initial encounter: Secondary | ICD-10-CM | POA: Diagnosis present

## 2019-10-20 DIAGNOSIS — Z20822 Contact with and (suspected) exposure to covid-19: Secondary | ICD-10-CM | POA: Diagnosis present

## 2019-10-20 DIAGNOSIS — Z882 Allergy status to sulfonamides status: Secondary | ICD-10-CM

## 2019-10-20 DIAGNOSIS — B961 Klebsiella pneumoniae [K. pneumoniae] as the cause of diseases classified elsewhere: Secondary | ICD-10-CM | POA: Diagnosis present

## 2019-10-20 DIAGNOSIS — Z7982 Long term (current) use of aspirin: Secondary | ICD-10-CM

## 2019-10-20 DIAGNOSIS — R918 Other nonspecific abnormal finding of lung field: Secondary | ICD-10-CM | POA: Diagnosis present

## 2019-10-20 DIAGNOSIS — R001 Bradycardia, unspecified: Secondary | ICD-10-CM | POA: Diagnosis present

## 2019-10-20 DIAGNOSIS — N179 Acute kidney failure, unspecified: Secondary | ICD-10-CM | POA: Diagnosis present

## 2019-10-20 DIAGNOSIS — Z880 Allergy status to penicillin: Secondary | ICD-10-CM

## 2019-10-20 DIAGNOSIS — I952 Hypotension due to drugs: Secondary | ICD-10-CM | POA: Diagnosis present

## 2019-10-20 DIAGNOSIS — N170 Acute kidney failure with tubular necrosis: Secondary | ICD-10-CM | POA: Diagnosis present

## 2019-10-20 DIAGNOSIS — E785 Hyperlipidemia, unspecified: Secondary | ICD-10-CM | POA: Diagnosis present

## 2019-10-20 DIAGNOSIS — Z888 Allergy status to other drugs, medicaments and biological substances status: Secondary | ICD-10-CM

## 2019-10-20 DIAGNOSIS — T83511A Infection and inflammatory reaction due to indwelling urethral catheter, initial encounter: Secondary | ICD-10-CM | POA: Diagnosis not present

## 2019-10-20 DIAGNOSIS — E860 Dehydration: Secondary | ICD-10-CM | POA: Diagnosis present

## 2019-10-20 DIAGNOSIS — N320 Bladder-neck obstruction: Secondary | ICD-10-CM | POA: Diagnosis not present

## 2019-10-20 DIAGNOSIS — A419 Sepsis, unspecified organism: Secondary | ICD-10-CM

## 2019-10-20 DIAGNOSIS — I25118 Atherosclerotic heart disease of native coronary artery with other forms of angina pectoris: Secondary | ICD-10-CM | POA: Diagnosis not present

## 2019-10-20 DIAGNOSIS — Z8673 Personal history of transient ischemic attack (TIA), and cerebral infarction without residual deficits: Secondary | ICD-10-CM

## 2019-10-20 DIAGNOSIS — I959 Hypotension, unspecified: Secondary | ICD-10-CM | POA: Diagnosis present

## 2019-10-20 DIAGNOSIS — Y846 Urinary catheterization as the cause of abnormal reaction of the patient, or of later complication, without mention of misadventure at the time of the procedure: Secondary | ICD-10-CM | POA: Diagnosis present

## 2019-10-20 DIAGNOSIS — I2510 Atherosclerotic heart disease of native coronary artery without angina pectoris: Secondary | ICD-10-CM | POA: Diagnosis present

## 2019-10-20 DIAGNOSIS — I252 Old myocardial infarction: Secondary | ICD-10-CM

## 2019-10-20 DIAGNOSIS — Z9842 Cataract extraction status, left eye: Secondary | ICD-10-CM

## 2019-10-20 DIAGNOSIS — I951 Orthostatic hypotension: Secondary | ICD-10-CM | POA: Diagnosis not present

## 2019-10-20 DIAGNOSIS — Z87891 Personal history of nicotine dependence: Secondary | ICD-10-CM

## 2019-10-20 DIAGNOSIS — Z794 Long term (current) use of insulin: Secondary | ICD-10-CM

## 2019-10-20 LAB — BASIC METABOLIC PANEL
Anion gap: 11 (ref 5–15)
BUN: 14 mg/dL (ref 8–23)
CO2: 23 mmol/L (ref 22–32)
Calcium: 9.6 mg/dL (ref 8.9–10.3)
Chloride: 100 mmol/L (ref 98–111)
Creatinine, Ser: 1.39 mg/dL — ABNORMAL HIGH (ref 0.61–1.24)
GFR calc Af Amer: 56 mL/min — ABNORMAL LOW (ref >60)
GFR calc non Af Amer: 48 mL/min — ABNORMAL LOW (ref >60)
Glucose, Bld: 217 mg/dL — ABNORMAL HIGH (ref 70–99)
Potassium: 3.8 mmol/L (ref 3.5–5.1)
Sodium: 134 mmol/L — ABNORMAL LOW (ref 135–145)

## 2019-10-20 LAB — HEMOGLOBIN A1C
Hgb A1c MFr Bld: 7.1 % — ABNORMAL HIGH (ref 4.8–5.6)
Mean Plasma Glucose: 157.07 mg/dL

## 2019-10-20 LAB — CBC
HCT: 38.3 % — ABNORMAL LOW (ref 39.0–52.0)
HCT: 38.5 % — ABNORMAL LOW (ref 39.0–52.0)
Hemoglobin: 13 g/dL (ref 13.0–17.0)
Hemoglobin: 12.7 g/dL — ABNORMAL LOW (ref 13.0–17.0)
MCH: 28 pg (ref 26.0–34.0)
MCH: 27.2 pg (ref 26.0–34.0)
MCHC: 33.9 g/dL (ref 30.0–36.0)
MCHC: 33 g/dL (ref 30.0–36.0)
MCV: 82.5 fL (ref 80.0–100.0)
MCV: 82.4 fL (ref 80.0–100.0)
Platelets: 152 10*3/uL (ref 150–400)
Platelets: 135 K/uL — ABNORMAL LOW (ref 150–400)
RBC: 4.64 MIL/uL (ref 4.22–5.81)
RBC: 4.67 MIL/uL (ref 4.22–5.81)
RDW: 14.1 % (ref 11.5–15.5)
RDW: 14.1 % (ref 11.5–15.5)
WBC: 9.4 10*3/uL (ref 4.0–10.5)
WBC: 7.4 K/uL (ref 4.0–10.5)
nRBC: 0 % (ref 0.0–0.2)
nRBC: 0 % (ref 0.0–0.2)

## 2019-10-20 LAB — LACTIC ACID, PLASMA
Lactic Acid, Venous: 0.9 mmol/L (ref 0.5–1.9)
Lactic Acid, Venous: 1.2 mmol/L (ref 0.5–1.9)

## 2019-10-20 LAB — URINALYSIS, ROUTINE W REFLEX MICROSCOPIC
Bilirubin Urine: NEGATIVE
Glucose, UA: NEGATIVE mg/dL
Ketones, ur: NEGATIVE mg/dL
Nitrite: POSITIVE — AB
Protein, ur: 30 mg/dL — AB
Specific Gravity, Urine: 1.01 (ref 1.005–1.030)
WBC, UA: >50 WBC/hpf — ABNORMAL HIGH (ref 0–5)
pH: 6 (ref 5.0–8.0)

## 2019-10-20 LAB — CREATININE, SERUM
Creatinine, Ser: 1.22 mg/dL (ref 0.61–1.24)
GFR calc Af Amer: >60 mL/min
GFR calc non Af Amer: 56 mL/min — ABNORMAL LOW

## 2019-10-20 LAB — TROPONIN I (HIGH SENSITIVITY)
Troponin I (High Sensitivity): 27 ng/L — ABNORMAL HIGH (ref <18)
Troponin I (High Sensitivity): 21 ng/L — ABNORMAL HIGH (ref <18)
Troponin I (High Sensitivity): 21 ng/L — ABNORMAL HIGH (ref <18)

## 2019-10-20 LAB — GLUCOSE, CAPILLARY
Glucose-Capillary: 191 mg/dL — ABNORMAL HIGH (ref 70–99)
Glucose-Capillary: 226 mg/dL — ABNORMAL HIGH (ref 70–99)

## 2019-10-20 LAB — MAGNESIUM: Magnesium: 1.6 mg/dL — ABNORMAL LOW (ref 1.7–2.4)

## 2019-10-20 LAB — PHOSPHORUS: Phosphorus: 3.8 mg/dL (ref 2.5–4.6)

## 2019-10-20 MED ORDER — SODIUM CHLORIDE 0.9 % IV SOLN: 1.0000 g | INTRAVENOUS | Status: DC

## 2019-10-20 MED ORDER — INSULIN ASPART 100 UNIT/ML ~~LOC~~ SOLN
0.0000 [IU] | Freq: Every day | SUBCUTANEOUS | Status: DC
  Administered 2019-10-20: 2 [IU] via SUBCUTANEOUS

## 2019-10-20 MED ORDER — ROSUVASTATIN CALCIUM 20 MG PO TABS
20.0000 mg | ORAL_TABLET | Freq: Every day | ORAL | Status: DC
  Administered 2019-10-21 – 2019-10-23 (×3): 20 mg via ORAL
  Filled 2019-10-20 (×3): qty 1

## 2019-10-20 MED ORDER — SODIUM CHLORIDE 0.9 % IV SOLN: INTRAVENOUS | Status: DC

## 2019-10-20 MED ORDER — ACETAMINOPHEN 325 MG PO TABS
650.0000 mg | ORAL_TABLET | Freq: Four times a day (QID) | ORAL | Status: DC | PRN
  Administered 2019-10-21: 650 mg via ORAL
  Filled 2019-10-20: qty 2

## 2019-10-20 MED ORDER — INSULIN ASPART 100 UNIT/ML ~~LOC~~ SOLN
0.0000 [IU] | Freq: Three times a day (TID) | SUBCUTANEOUS | Status: DC
  Administered 2019-10-20: 3 [IU] via SUBCUTANEOUS
  Administered 2019-10-21: 2 [IU] via SUBCUTANEOUS
  Administered 2019-10-21 – 2019-10-22 (×3): 3 [IU] via SUBCUTANEOUS
  Administered 2019-10-22: 5 [IU] via SUBCUTANEOUS
  Administered 2019-10-22: 2 [IU] via SUBCUTANEOUS
  Administered 2019-10-23: 5 [IU] via SUBCUTANEOUS
  Administered 2019-10-23: 2 [IU] via SUBCUTANEOUS

## 2019-10-20 MED ORDER — KETOCONAZOLE 2 % EX CREA
TOPICAL_CREAM | Freq: Two times a day (BID) | CUTANEOUS | Status: DC
  Filled 2019-10-20 (×2): qty 15

## 2019-10-20 MED ORDER — SODIUM CHLORIDE 0.9 % IV BOLUS
1000.0000 mL | Freq: Once | INTRAVENOUS | Status: AC
  Administered 2019-10-20: 1000 mL via INTRAVENOUS

## 2019-10-20 MED ORDER — SENNOSIDES-DOCUSATE SODIUM 8.6-50 MG PO TABS
1.0000 | ORAL_TABLET | Freq: Every day | ORAL | Status: DC
  Administered 2019-10-21 – 2019-10-23 (×3): 1 via ORAL
  Filled 2019-10-20 (×3): qty 1

## 2019-10-20 MED ORDER — ONDANSETRON HCL 4 MG PO TABS: 4.0000 mg | ORAL_TABLET | Freq: Four times a day (QID) | ORAL | Status: DC | PRN

## 2019-10-20 MED ORDER — ONDANSETRON HCL 4 MG/2ML IJ SOLN: 4.0000 mg | Freq: Four times a day (QID) | INTRAMUSCULAR | Status: DC | PRN

## 2019-10-20 MED ORDER — ACETAMINOPHEN 650 MG RE SUPP: 650.0000 mg | Freq: Four times a day (QID) | RECTAL | Status: DC | PRN

## 2019-10-20 MED ORDER — ASPIRIN 81 MG PO CHEW
81.0000 mg | CHEWABLE_TABLET | Freq: Every day | ORAL | Status: DC
  Administered 2019-10-21 – 2019-10-23 (×3): 81 mg via ORAL
  Filled 2019-10-20 (×3): qty 1

## 2019-10-20 MED ORDER — SODIUM CHLORIDE 0.9 % IV SOLN
2.0000 g | INTRAVENOUS | Status: DC
  Administered 2019-10-21 – 2019-10-22 (×2): 2 g via INTRAVENOUS
  Filled 2019-10-20 (×3): qty 20

## 2019-10-20 MED ORDER — LACTATED RINGERS IV BOLUS (SEPSIS): 1000.0000 mL | Freq: Once | INTRAVENOUS | Status: DC

## 2019-10-20 MED ORDER — LACTATED RINGERS IV BOLUS (SEPSIS): 500.0000 mL | Freq: Once | INTRAVENOUS | Status: DC

## 2019-10-20 MED ORDER — NITROGLYCERIN 0.4 MG SL SUBL
0.4000 mg | SUBLINGUAL_TABLET | SUBLINGUAL | Status: DC | PRN
  Administered 2019-10-20 (×2): 0.4 mg via SUBLINGUAL
  Filled 2019-10-20 (×2): qty 1

## 2019-10-20 MED ORDER — ENOXAPARIN SODIUM 40 MG/0.4ML ~~LOC~~ SOLN
40.0000 mg | Freq: Every day | SUBCUTANEOUS | Status: DC
  Administered 2019-10-20 – 2019-10-23 (×4): 40 mg via SUBCUTANEOUS
  Filled 2019-10-20 (×4): qty 0.4

## 2019-10-20 MED ORDER — CLONAZEPAM 0.25 MG PO TBDP
0.5000 mg | ORAL_TABLET | Freq: Four times a day (QID) | ORAL | Status: DC
  Administered 2019-10-20 – 2019-10-23 (×13): 0.5 mg via ORAL
  Filled 2019-10-20 (×13): qty 2

## 2019-10-20 MED ORDER — LACTATED RINGERS IV SOLN: INTRAVENOUS | Status: DC

## 2019-10-20 MED ORDER — FENOFIBRATE 160 MG PO TABS
160.0000 mg | ORAL_TABLET | Freq: Every day | ORAL | Status: DC
  Administered 2019-10-21 – 2019-10-23 (×3): 160 mg via ORAL
  Filled 2019-10-20 (×3): qty 1

## 2019-10-20 MED ORDER — SODIUM CHLORIDE 0.9 % IV SOLN
1.0000 g | INTRAVENOUS | Status: AC
  Administered 2019-10-20: 1 g via INTRAVENOUS
  Filled 2019-10-20: qty 10

## 2019-10-20 NOTE — H&P (Signed)
History and Physical    Alexis Griffith ZOX:[id number] DOB: Jun 13, 1940 DOA: 10/20/2019  PCP: Haywood Pao, MD  Patient coming from: [facility] home I have personally briefly reviewed patient's old medical records in [place]  Chief Complaint: Dizziness  HPI: Alexis Griffith is a 79 y.o. 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  Recurrent syncope/hypotension presents to emergency department for evaluation of dizziness.  Patient tells me that his catheter was changed yesterday at urology office and he had severe pain at that time.  Reports that he noticed some foul-smelling yellowish-green discharge at catheter site.  He was started on 2 medications-Terazosin and finasteride by his urologist which he took this morning for the first time and after 30 minutes he became dizzy and fell backward and hit on wall however he denies loss of consciousness, head trauma, headache, blurry vision, chest pain, shortness of breath, fever, chills, cough, congestion, nausea, vomiting.  Due to persistent dizziness he brought to the emergency department for further evaluation and management.  He tells me that he was admitted recently on 10/01/2019 with syncope and collapse/orthostatic hypotension and discharged to [facility] on 10/10/2019.  He is supposed to be discharged from nursing home today.  On 10/03/2019: Cardiology ordered 30-day event monitor for further evaluation of syncope.  As per nursing home staff: Patient has been refusing to put on his heart monitor and compression hose-he said look like there were too many wires.  No history of smoking, alcohol, listed drug use.  ED  Course: Upon arrival to ED: Patient blood pressure noted to be low, afebrile, no leukocytosis, lactic acid: WNL, CMP shows sodium of 134, AKI, troponin: 27, UA positive for nitrates leukocytes and WBCs.  UC, BC, COVID-19: Pending.  Chest x-ray came back negative.  Code sepsis was called as patient blood pressure dropped in 50s and was given IV fluid bolus x1.  Rocephin given.  [facility] hospitalist consulted for admission.  Review of Systems: As per HPI otherwise negative.    Past Medical History:  Diagnosis Date  . Anxiety   . 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 CAD (coronary artery disease)   . Carotid artery disease ([place])    CTA 08/2019 of the head and neck show moderate 65 to 75% bilateral ICA stenoses   . Chronic diastolic CHF (congestive heart failure) ([place])   . Diabetes mellitus type 2 in nonobese (HCC)   . Excessive urination at night 06/05/2013  . 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 Hyperlipidemia   . Hypertension   . Hypertensive urgency   . Hypomagnesemia 09/11/2019  . Mild anemia 09/11/2019  . NSTEMI (non-ST elevated myocardial infarction) ([place]) 09/09/2019  . Orthostatic hypotension 09/11/2019  . Premature atrial contractions 09/12/2019  . Pulmonary nodules   . PVC (premature ventricular contraction)   . Renal mass   . 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 Burt Knack & Dr. Cyndia Bent  . Severe aortic stenosis   . Sinus bradycardia on ECG   . Sleep apnea   . 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 Wilburn Cornelia.  . Stroke ([place])   . TIA (transient ischemic attack)   .  Urinary retention 09/11/2019    Past Surgical History:  Procedure Laterality Date  . CATARACT EXTRACTION, BILATERAL  10/2015, and 11/2015   with adding  TORIC lenses bilaterally   . INTRAVASCULAR PRESSURE WIRE/FFR STUDY N/A 07/07/2019   Procedure: INTRAVASCULAR PRESSURE WIRE/FFR STUDY;  Surgeon: [name], MD;  Location: [facility];  Service: Cardiovascular;  Laterality: N/A;  . LEFT HEART CATH AND CORONARY ANGIOGRAPHY N/A 07/07/2019   Procedure: LEFT HEART CATH AND CORONARY ANGIOGRAPHY;  Surgeon: [name], MD;  Location: [place] [facility];  Service: Cardiovascular;  Laterality: N/A;  . SQUAMOUS CELL CARCINOMA EXCISION     tongue  . TEE WITHOUT CARDIOVERSION N/A 08/15/2019   Procedure: TRANSESOPHAGEAL ECHOCARDIOGRAM (TEE);  Surgeon: Sherren Mocha, MD;  Location: [place];  Service: Open Heart Surgery;  Laterality: N/A;  . TRANSCATHETER AORTIC VALVE REPLACEMENT, TRANSFEMORAL N/A 08/15/2019   Procedure: TRANSCATHETER AORTIC VALVE REPLACEMENT, TRANSFEMORAL using Edwards [employer] SAPIEN 3 Ultra 26 mm THV.;  Surgeon: Sherren Mocha, MD;  Location: [place];  Service: Open Heart Surgery;  Laterality: N/A;     reports that he quit smoking about 44 years ago. He has never used smokeless tobacco. He reports that he does not drink alcohol and does not use drugs.  Allergies  Allergen Reactions  . Macrodantin [Nitrofurantoin] Other (See Comments)    "blocked kidneys"   . Tape Other (See Comments)    "Tears my skin and leaves marks." PLEASE USE COBAN!!  . Sulfa Antibiotics Rash  . Lisinopril Cough  . Nutritional Supplements Other (See Comments)    Protein drink given to me "did something"  . Penicillins Other (See Comments)    08/30/19- Patient can't remember, was 35+ years ago. OK with trying cephalexin in hospital    Family History  Problem Relation Age of Onset  . Heart disease Mother   . Heart attack Mother   . Heart disease Father   . Heart attack Father     Prior to Admission medications   Medication Sig Start Date End Date Taking? Authorizing Provider  acetaminophen (TYLENOL) 325 MG tablet Take 650 mg by mouth every 6 (six) hours as needed for mild pain or moderate pain.    [provider]  aspirin 81 MG chewable tablet Chew 1 tablet (81 mg total) by mouth daily.  10/03/19   Kayleen Memos, DO  carboxymethylcellulose (REFRESH PLUS) 0.5 % SOLN Place 1 drop into both eyes every 12 (twelve) hours as needed (dry eyes).     [provider]  clonazePAM (KLONOPIN) 0.5 MG tablet Take 1 tablet (0.5 mg total) by mouth 4 (four) times daily. 10/03/19   Kayleen Memos, DO  Coenzyme Q10 200 MG capsule Take 200 mg by mouth daily.    [provider]  fenofibrate 160 MG tablet Take 1 tablet (160 mg total) by mouth daily. 10/04/19 01/02/20  Irene Pap N, DO  glucose blood test strip 1 each by Other route as needed.  05/12/12   [provider]  insulin aspart (NOVOLOG FLEXPEN) 100 UNIT/ML FlexPen Inject 1-9 Units into the skin See admin instructions. Inject as per sliding scale: If 70-120= 0 units; 121-150= 1 unit; 151-200= 2 units; 201-250= 3 units; 251-300= 5 units; 301-350= 7 units; 351-400= 9 units GREATER THAN 400 CALL MD, subcutaneously before meals for DM    [provider]  isosorbide mononitrate (IMDUR) 30 MG 24 hr tablet Take 1 tablet (30 mg total) by mouth daily. 10/03/19 11/02/19  [address] Archie Patten N, DO  Lancets (ONETOUCH DELICA PLUS [id number]) MISC 1 each by Other route daily.  03/23/18   [provider]  losartan (COZAAR) 50 MG tablet Take 1 tablet (50 mg total) by mouth daily. 10/03/19   Kayleen Memos, DO  magnesium oxide (MAG-OX) 400 (241.3 Mg) MG tablet Take 1 tablet (400 mg total) by mouth daily. 09/24/19   Barrett, Evelene Croon, PA-C  metoprolol tartrate (LOPRESSOR) 50 MG tablet Take 1 tablet (50 mg total) by mouth 2 (two) times daily. 10/03/19 11/02/19  Kayleen Memos, DO  nitroGLYCERIN (NITROSTAT) 0.4 MG SL tablet Place 1 tablet (0.4 mg total) under the tongue every 5 (five) minutes as needed for chest pain (up to 3 doses). Do not give if blood pressure is low (less than 382 systolic). Patient taking differently: Place 0.4 mg under the tongue every 5 (five) minutes x 3 doses as needed for chest pain.  09/12/19   Dunn, Dayna N, PA-C  ONE  TOUCH ULTRA TEST test strip 1 each by Other route daily.  05/12/12   [provider]  polyethylene glycol (MIRALAX / GLYCOLAX) 17 g packet Take 17 g by mouth daily as needed for mild constipation. 09/23/19   Barrett, Evelene Croon, PA-C  ranolazine (RANEXA) 500 MG 12 hr tablet Take 1 tablet (500 mg total) by mouth 2 (two) times daily. 10/03/19 11/02/19  Kayleen Memos, DO  rosuvastatin (CRESTOR) 20 MG tablet Take 1 tablet (20 mg total) by mouth daily. 10/03/19   Kayleen Memos, DO    Physical Exam: Vitals:   10/20/19 1100 10/20/19 1115 10/20/19 1130 10/20/19 1145  BP: (!) 95/41 (!) 80/40 (!) 62/46 (!) 118/47  Pulse:  64 (!) 52 63  Resp:  17 16 14   Temp:      TempSrc:      SpO2:  94% 93% 92%  Weight:      Height:        Constitutional: NAD, calm, comfortable, on room air, communicating well Eyes: PERRL, lids and conjunctivae normal ENMT: Mucous membranes are moist. Posterior pharynx clear of any exudate or lesions.Normal dentition.  Neck: normal, supple, no masses, no thyromegaly Respiratory: clear to auscultation bilaterally, no wheezing, no crackles. Normal respiratory effort. No accessory muscle use.  Cardiovascular: Regular rate and rhythm, no murmurs / rubs / gallops. No extremity edema. 2+ pedal pulses. No carotid bruits.  Abdomen: no tenderness, no masses palpated. No hepatosplenomegaly. Bowel sounds positive.  Genital exam:penis and scrotum: Skin appears mild erythematous, foul-smelling thick yellowish-green discharge noted around catheter, penis and scrotal area. Musculoskeletal: no clubbing / cyanosis. No joint deformity upper and lower extremities. Good ROM, no contractures. Normal muscle tone.  Skin: no rashes, lesions, ulcers. No induration Neurologic: CN 2-12 grossly intact. Sensation intact, DTR normal. Strength 5/5 in all 4.  Psychiatric: Normal judgment and insight. Alert and oriented x 3. Normal mood.    Labs on Admission: I have personally reviewed following labs and  imaging studies  CBC: Recent Labs  Lab 10/20/19 1113  WBC 9.4  HGB 13.0  HCT 38.3*  MCV 82.5  PLT 505   Basic Metabolic Panel: Recent Labs  Lab 10/20/19 1113  NA 134*  K 3.8  CL 100  CO2 23  GLUCOSE 217*  BUN 14  CREATININE 1.39*  CALCIUM 9.6   GFR: Estimated Creatinine Clearance: 45.2 mL/min (A) (by C-G formula based on SCr of 1.39 mg/dL (H)). Liver Function Tests: No results for input(s): AST, ALT, ALKPHOS, BILITOT,  PROT, ALBUMIN in the last 168 hours. No results for input(s): LIPASE, AMYLASE in the last 168 hours. No results for input(s): AMMONIA in the last 168 hours. Coagulation Profile: No results for input(s): INR, PROTIME in the last 168 hours. Cardiac Enzymes: No results for input(s): CKTOTAL, CKMB, CKMBINDEX, TROPONINI in the last 168 hours. BNP (last 3 results) No results for input(s): PROBNP in the last 8760 hours. HbA1C: No results for input(s): HGBA1C in the last 72 hours. CBG: No results for input(s): GLUCAP in the last 168 hours. Lipid Profile: No results for input(s): CHOL, HDL, LDLCALC, TRIG, CHOLHDL, LDLDIRECT in the last 72 hours. Thyroid Function Tests: No results for input(s): TSH, T4TOTAL, FREET4, T3FREE, THYROIDAB in the last 72 hours. Anemia Panel: No results for input(s): VITAMINB12, FOLATE, FERRITIN, TIBC, IRON, RETICCTPCT in the last 72 hours. Urine analysis:    Component Value Date/Time   COLORURINE AMBER (A) 10/20/2019 1113   APPEARANCEUR CLOUDY (A) 10/20/2019 1113   LABSPEC 1.010 10/20/2019 1113   PHURINE 6.0 10/20/2019 1113   GLUCOSEU NEGATIVE 10/20/2019 1113   HGBUR MODERATE (A) 10/20/2019 1113   BILIRUBINUR NEGATIVE 10/20/2019 1113   [place] 10/20/2019 1113   PROTEINUR 30 (A) 10/20/2019 1113   NITRITE POSITIVE (A) 10/20/2019 1113   LEUKOCYTESUR LARGE (A) 10/20/2019 1113    Radiological Exams on Admission: DG Chest 2 View  Result Date: 10/20/2019 CLINICAL DATA:  Dizziness EXAM: CHEST - 2 VIEW COMPARISON:   October 01, 2019 FINDINGS: Trachea midline. Cardiomediastinal contours and hilar structures are normal. Lungs are clear.  Signs of trans arterial aortic valve replacement. On limited assessment skeletal structures without acute process. IMPRESSION: No active cardiopulmonary disease. Electronically Signed   By: Zetta Bills M.D.   On: 10/20/2019 11:00    EKG: Independently reviewed.  Sinus rhythm, LVH with secondary repolarization changes.  No ST elevation or depression noted.  Assessment/Plan Principal Problem:   UTI (urinary tract infection) Active Problems:   Hyperlipidemia LDL goal <70   Sinus bradycardia on ECG   Bladder neck obstruction   CAD (coronary artery disease)   Diabetes mellitus type 2 in nonobese (HCC)   Elevated troponin   Hypotension   AKI (acute kidney injury) ([place])   UTI (urinary tract infection) due to urinary indwelling catheter ([place])    UTI due to urinary indwelling catheter: -Patient's urinary catheter was changed yesterday at urology office.  Reports severe pain at the time of change of catheter.  He is afebrile with no leukocytosis, not tachycardic, not tachypneic, lactic acid: WNL, hypotensive upon arrival to ED.  Received IV fluid bolus.  His blood pressure has improved to 150/61, heart rate: 84.  Received Rocephin in ED. -Contamination could be possibility? Follow UC result.  -Blood culture, COVID-19 pending -Admit patient on the floor -Continue Rocephin -Continue maintenance IV fluids, monitor vitals closely.  AKI: Creatinine: 1.39, GFR: 48 -Continue IV fluids.  Avoid nephrotoxic medication.  Repeat BMP tomorrow a.m.  Hypotension: Likely secondary to orthostatic hypotension secondary to polypharmacy-Terazosin and finasteride (new meds) -Hold Imdur, finasteride, Terazosin.  Continue IV fluids.  Monitor blood pressure closely -Consult PT/OT. -Check orthostatic vitals.  On fall precautions. -Reviewed recent echo.  Yeast infection: In groin region -Start  patient on ketoconazole cream twice daily  Type 2 diabetes mellitus: Check A1c -Start patient on sliding scale insulin and monitor blood pressure closely  Elevated troponin: 27.  Chronic -Patient denies ACS symptoms.  Reviewed EKG.  Severe aortic stenosis s/p TAVR: -Stable.  Patient denies any  symptoms currently.  Grade 1 diastolic dysfunction: Patient appears euvolemic -Continue aspirin, statin, hold Imdur for now.  Strict INO's and daily weight.  Monitor electrolytes closely. -Last echo on 09/10/2019 which showed ejection fraction of 60 to 65% with grade 1 diastolic dysfunction.  Carotid artery stenosis: Right carotid artery stenosis, 40 to 59% on carotid ultrasound done on 6/21. -Continue aspirin, statin.  Left renal mass with concern of malignancy: -Followed by urology outpatient.  Pulmonary nodules: -Follow-up outpatient with PCCM  DVT prophylaxis: Lovenox/SCD Code Status: Full code Family Communication: None present at bedside.  Plan of care discussed with patient in length and he verbalized understanding and agreed with it. Disposition Plan: SNF in 1-2 days Consults called: none Admission status: Inpatient   Mckinley Jewel MD [facility]  If 7PM-7AM, please contact night-coverage [email or web address] Password [facility]  10/20/2019, 12:41 PM

## 2019-10-20 NOTE — Social Work (Signed)
Spoke with [facility]. Pt was to be d/c home today (8/20) and requested to come to hospital. [facility], MD, and PT all contacted with this information as well as a brief message about pt functional status/difficulty getting auth for SNF during previous admission. TOC team will follow, pt referral had been made at SNF for [facility] PT/OT/RN at home w/ [facility]  Westley Hummer, MSW, [place] Work

## 2019-10-20 NOTE — ED Notes (Signed)
Alexis Griffith, Doctor, hospital from [facility] called to report that pt has not been requesting to be taken to the hospital for the past two days.  Requests for hospitalization started today.  Pt also became increasingly anxious on today 8/20 and asked to take his heart monitor off

## 2019-10-20 NOTE — ED Triage Notes (Signed)
Pt supposed to be discharged from [facility] today and he has requested to be sent to hospital. He has soft pressure that nurse says is baseline. He has had multiple request to hospital in past 2 days.

## 2019-10-20 NOTE — Progress Notes (Signed)
Pt complaining of 8/10 dull left sided Chest Pain, Prentiss Bells, RN , Ave Filter, RN and Jonelle Sidle, [place] at bedside. EKG perfiremed on patient. VS taken BP 149/69, P 86, SPO2 95 on RA. MD paged [facility] page back. Pt given 1 Nitro at 1733.

## 2019-10-20 NOTE — ED Provider Notes (Signed)
[facility] EMERGENCY DEPARTMENT Provider Note   CSN: [id number] Arrival date & time: 10/20/19  1025     History Chief Complaint  Patient presents with  . Dizziness    Alexis Griffith is a 79 y.o. male.  Pt presents to the ED today with dizziness.  He has a hx of orthostatic hypotension and syncope.  He's supposed to be wearing thigh high compression hose, but has not been wearing them.  He said he was told he did not have to wear them.  He was also supposed to wear a 30 day monitor to check for arrhythmias, but refused this.  He said it looked like there were too many wires.  He had his foley catheter changed yesterday at the urology office.  It hurt a lot to change the foley.  He denies any cp or sob.  No f/c.  He's supposed to be d/c from [facility] today.  He said he is ready to go.        Past Medical History:  Diagnosis Date  . Anxiety   . 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 CAD (coronary artery disease)   . Carotid artery disease ([place])    CTA 08/2019 of the head and neck show moderate 65 to 75% bilateral ICA stenoses   . Chronic diastolic CHF (congestive heart failure) ([place])   . Diabetes mellitus type 2 in nonobese (HCC)   . Excessive urination at night 06/05/2013  . 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 Hyperlipidemia   . Hypertension   . Hypertensive urgency   . Hypomagnesemia 09/11/2019  . Mild anemia 09/11/2019  . NSTEMI (non-ST elevated myocardial infarction) ([place]) 09/09/2019  . Orthostatic hypotension 09/11/2019  . Premature atrial contractions 09/12/2019  . Pulmonary nodules   . PVC (premature ventricular contraction)   . Renal mass   . 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 Burt Knack & Dr. Cyndia Bent  . Severe aortic stenosis   . Sinus bradycardia on ECG   . Sleep apnea   . 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 Wilburn Cornelia.  . Stroke ([place])    . TIA (transient ischemic attack)   . Urinary retention 09/11/2019    Patient Active Problem List   Diagnosis Date Noted  . Syncope   . Syncope and collapse 10/01/2019  . Abdominal pain   . CHF (congestive heart failure) ([place]) 09/25/2019  . Demand ischemia ([place]) 09/22/2019  . Premature atrial contractions 09/12/2019  . Orthostatic hypotension 09/11/2019  . Urinary retention 09/11/2019  . Mild anemia 09/11/2019  . Hypomagnesemia 09/11/2019  . NSTEMI (non-ST elevated myocardial infarction) ([place]) 09/09/2019  . Elevated troponin   . Chest pain   . Hypertensive urgency   . Acute on chronic diastolic heart failure ([place]) 08/15/2019  . 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 Diabetes mellitus type 2 in nonobese (HCC)   . Severe aortic stenosis   . Sleep apnea   . Renal mass   . Coronary artery disease involving native coronary artery of native heart with angina pectoris ([place]) 07/04/2019  . 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 Excessive urination at night 06/05/2013  . TIA (transient ischemic attack) 07/27/2012  . 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 Carotid artery disease ([place]) 08/24/2011  . PVC's (premature ventricular contractions)   . Labile hypertension   . Hyperlipidemia LDL goal <70   .  Anxiety   . Sinus bradycardia on ECG     Past Surgical History:  Procedure Laterality Date  . CATARACT EXTRACTION, BILATERAL  10/2015, and 11/2015   with adding  TORIC lenses bilaterally   . INTRAVASCULAR PRESSURE WIRE/FFR STUDY N/A 07/07/2019   Procedure: INTRAVASCULAR PRESSURE WIRE/FFR STUDY;  Surgeon: [name], MD;  Location: [facility];  Service: Cardiovascular;  Laterality: N/A;  . LEFT HEART CATH AND CORONARY ANGIOGRAPHY N/A 07/07/2019   Procedure: LEFT HEART CATH AND CORONARY ANGIOGRAPHY;  Surgeon: [name], MD;  Location: [facility];  Service: Cardiovascular;  Laterality: N/A;  . SQUAMOUS CELL CARCINOMA EXCISION     tongue    . TEE WITHOUT CARDIOVERSION N/A 08/15/2019   Procedure: TRANSESOPHAGEAL ECHOCARDIOGRAM (TEE);  Surgeon: Sherren Mocha, MD;  Location: [facility];  Service: Open Heart Surgery;  Laterality: N/A;  . TRANSCATHETER AORTIC VALVE REPLACEMENT, TRANSFEMORAL N/A 08/15/2019   Procedure: TRANSCATHETER AORTIC VALVE REPLACEMENT, TRANSFEMORAL using Edwards [employer] SAPIEN 3 Ultra 26 mm THV.;  Surgeon: Sherren Mocha, MD;  Location: [facility];  Service: Open Heart Surgery;  Laterality: N/A;       Family History  Problem Relation Age of Onset  . Heart disease Mother   . Heart attack Mother   . Heart disease Father   . Heart attack Father     Social History   Tobacco Use  . Smoking status: Former Smoker    Quit date: 06/25/1975    Years since quitting: 44.3  . Smokeless tobacco: Never Used  Vaping Use  . Vaping Use: Never used  Substance Use Topics  . Alcohol use: No  . Drug use: No    Home Medications Prior to Admission medications   Medication Sig Start Date End Date Taking? Authorizing Provider  acetaminophen (TYLENOL) 325 MG tablet Take 650 mg by mouth every 6 (six) hours as needed for mild pain or moderate pain.    [provider]  aspirin 81 MG chewable tablet Chew 1 tablet (81 mg total) by mouth daily. 10/03/19   Kayleen Memos, DO  carboxymethylcellulose (REFRESH PLUS) 0.5 % SOLN Place 1 drop into both eyes every 12 (twelve) hours as needed (dry eyes).     [provider]  clonazePAM (KLONOPIN) 0.5 MG tablet Take 1 tablet (0.5 mg total) by mouth 4 (four) times daily. 10/03/19   Kayleen Memos, DO  Coenzyme Q10 200 MG capsule Take 200 mg by mouth daily.    [provider]  fenofibrate 160 MG tablet Take 1 tablet (160 mg total) by mouth daily. 10/04/19 01/02/20  Irene Pap N, DO  glucose blood test strip 1 each by Other route as needed.  05/12/12   [provider]  insulin aspart (NOVOLOG FLEXPEN) 100 UNIT/ML FlexPen Inject 1-9 Units into the skin See admin  instructions. Inject as per sliding scale: If 70-120= 0 units; 121-150= 1 unit; 151-200= 2 units; 201-250= 3 units; 251-300= 5 units; 301-350= 7 units; 351-400= 9 units GREATER THAN 400 CALL MD, subcutaneously before meals for DM    [provider]  isosorbide mononitrate (IMDUR) 30 MG 24 hr tablet Take 1 tablet (30 mg total) by mouth daily. 10/03/19 11/02/19  Kayleen Memos, DO  Lancets (ONETOUCH DELICA PLUS [id number]) MISC 1 each by Other route daily.  03/23/18   [provider]  losartan (COZAAR) 50 MG tablet Take 1 tablet (50 mg total) by mouth daily. 10/03/19   Kayleen Memos, DO  magnesium  oxide (MAG-OX) 400 (241.3 Mg) MG tablet Take 1 tablet (400 mg total) by mouth daily. 09/24/19   Barrett, Evelene Croon, PA-C  metoprolol tartrate (LOPRESSOR) 50 MG tablet Take 1 tablet (50 mg total) by mouth 2 (two) times daily. 10/03/19 11/02/19  Kayleen Memos, DO  nitroGLYCERIN (NITROSTAT) 0.4 MG SL tablet Place 1 tablet (0.4 mg total) under the tongue every 5 (five) minutes as needed for chest pain (up to 3 doses). Do not give if blood pressure is low (less than 017 systolic). Patient taking differently: Place 0.4 mg under the tongue every 5 (five) minutes x 3 doses as needed for chest pain.  09/12/19   Dunn, Dayna N, PA-C  ONE TOUCH ULTRA TEST test strip 1 each by Other route daily.  05/12/12   [provider]  polyethylene glycol (MIRALAX / GLYCOLAX) 17 g packet Take 17 g by mouth daily as needed for mild constipation. 09/23/19   Barrett, Evelene Croon, PA-C  ranolazine (RANEXA) 500 MG 12 hr tablet Take 1 tablet (500 mg total) by mouth 2 (two) times daily. 10/03/19 11/02/19  Kayleen Memos, DO  rosuvastatin (CRESTOR) 20 MG tablet Take 1 tablet (20 mg total) by mouth daily. 10/03/19   Kayleen Memos, DO    Allergies    Macrodantin [nitrofurantoin], Tape, Sulfa antibiotics, Lisinopril, Nutritional supplements, and Penicillins  Review of Systems   Review of Systems  Neurological: Positive for dizziness.    All other systems reviewed and are negative.   Physical Exam Updated Vital Signs BP (!) 118/47   Pulse 63   Temp (!) 97.5 F (36.4 C) (Oral)   Resp 14   Ht 5\' 10"  (1.778 m)   Wt 79.4 kg   SpO2 92%   BMI 25.11 kg/m   Physical Exam Vitals and nursing note reviewed.  Constitutional:      Appearance: Normal appearance.  HENT:     Head: Normocephalic and atraumatic.     Right Ear: External ear normal.     Left Ear: External ear normal.     Nose: Nose normal.     Mouth/Throat:     Mouth: Mucous membranes are moist.     Pharynx: Oropharynx is clear.  Eyes:     Extraocular Movements: Extraocular movements intact.     Conjunctiva/sclera: Conjunctivae normal.     Pupils: Pupils are equal, round, and reactive to light.  Cardiovascular:     Rate and Rhythm: Normal rate and regular rhythm.     Pulses: Normal pulses.     Heart sounds: Normal heart sounds.  Pulmonary:     Effort: Pulmonary effort is normal.     Breath sounds: Normal breath sounds.  Abdominal:     General: Abdomen is flat. Bowel sounds are normal.     Palpations: Abdomen is soft.  Genitourinary:    Comments: Indwelling foley.  Urine looks cloudy. Musculoskeletal:        General: Normal range of motion.     Cervical back: Normal range of motion and neck supple.  Skin:    General: Skin is warm.     Capillary Refill: Capillary refill takes less than 2 seconds.  Neurological:     General: No focal deficit present.     Mental Status: He is alert and oriented to person, place, and time.  Psychiatric:        Mood and Affect: Mood normal.        Behavior: Behavior normal.  Thought Content: Thought content normal.     ED Results / Procedures / Treatments   Labs (all labs ordered are listed, but only abnormal results are displayed) Labs Reviewed  BASIC METABOLIC PANEL - Abnormal; Notable for the following components:      Result Value   Sodium 134 (*)    Glucose, Bld 217 (*)    Creatinine, Ser 1.39  (*)    GFR calc non Af Amer 48 (*)    GFR calc Af Amer 56 (*)    All other components within normal limits  CBC - Abnormal; Notable for the following components:   HCT 38.3 (*)    All other components within normal limits  URINALYSIS, ROUTINE W REFLEX MICROSCOPIC - Abnormal; Notable for the following components:   Color, Urine AMBER (*)    APPearance CLOUDY (*)    Hgb urine dipstick MODERATE (*)    Protein, ur 30 (*)    Nitrite POSITIVE (*)    Leukocytes,Ua LARGE (*)    WBC, UA >50 (*)    Bacteria, UA MANY (*)    All other components within normal limits  TROPONIN I (HIGH SENSITIVITY) - Abnormal; Notable for the following components:   Troponin I (High Sensitivity) 27 (*)    All other components within normal limits  URINE CULTURE  CULTURE, BLOOD (ROUTINE X 2)  CULTURE, BLOOD (ROUTINE X 2)  SARS CORONAVIRUS 2 BY RT PCR (HOSPITAL ORDER, [facility])  LACTIC ACID, PLASMA  LACTIC ACID, PLASMA  TROPONIN I (HIGH SENSITIVITY)    EKG EKG Interpretation  Date/Time:  Friday October 20 2019 10:30:33 EDT Ventricular Rate:  67 PR Interval:    QRS Duration: 105 QT Interval:  404 QTC Calculation: 427 R Axis:     Text Interpretation: Sinus rhythm LVH with secondary repolarization abnormality Inferior infarct, old No significant change since last tracing Confirmed by Isla Pence [phone]) on 10/20/2019 12:34:56 PM   Radiology DG Chest 2 View  Result Date: 10/20/2019 CLINICAL DATA:  Dizziness EXAM: CHEST - 2 VIEW COMPARISON:  October 01, 2019 FINDINGS: Trachea midline. Cardiomediastinal contours and hilar structures are normal. Lungs are clear.  Signs of trans arterial aortic valve replacement. On limited assessment skeletal structures without acute process. IMPRESSION: No active cardiopulmonary disease. Electronically Signed   By: Zetta Bills M.D.   On: 10/20/2019 11:00    Procedures Procedures (including critical care time)  Medications Ordered in  ED Medications  lactated ringers infusion (has no administration in time range)  lactated ringers bolus 1,000 mL (has no administration in time range)    And  lactated ringers bolus 1,000 mL (has no administration in time range)    And  lactated ringers bolus 500 mL (has no administration in time range)  cefTRIAXone (ROCEPHIN) 1 g in sodium chloride 0.9 % 100 mL IVPB (has no administration in time range)  cefTRIAXone (ROCEPHIN) 1 g in sodium chloride 0.9 % 100 mL IVPB (has no administration in time range)  sodium chloride 0.9 % bolus 1,000 mL (1,000 mLs Intravenous New Bag/Given 10/20/19 1231)    ED Course  I have reviewed the triage vital signs and the nursing notes.  Pertinent labs & imaging results that were available during my care of the patient were reviewed by me and considered in my medical decision making (see chart for details).    MDM Rules/Calculators/A&P  BP dropped to 50.  Pt placed in trendelenburg and pt given IVFs.  Code sepsis called. Pt given additional sepsis fluids and IV abx (rocephin).  BP has improved after fluids.  Pt d/w Dr. Doristine Bosworth (triad) for admission.  CRITICAL CARE Performed by: Isla Pence   Total critical care time: 30 minutes  Critical care time was exclusive of separately billable procedures and treating other patients.  Critical care was necessary to treat or prevent imminent or life-threatening deterioration.  Critical care was time spent personally by me on the following activities: development of treatment plan with patient and/or surrogate as well as nursing, discussions with consultants, evaluation of patient's response to treatment, examination of patient, obtaining history from patient or surrogate, ordering and performing treatments and interventions, ordering and review of laboratory studies, ordering and review of radiographic studies, pulse oximetry and re-evaluation of patient's condition.  Final Clinical  Impression(s) / ED Diagnoses Final diagnoses:  Sepsis without acute organ dysfunction, due to unspecified organism [facility])  Urinary tract infection associated with indwelling urethral catheter, initial encounter [facility])    Rx / [place] Orders ED Discharge Orders    None       Isla Pence, MD 10/20/19 1236

## 2019-10-20 NOTE — Evaluation (Signed)
Physical Therapy Evaluation Patient Details Name: Alexis Griffith MRN: [id number] DOB: 01/26/1941 Today's Date: 10/20/2019   History of Present Illness  The pt is a 79 yo male presenting from [facility] due to persistant dizziness. Upon work-up, the pt was found to have BP in the 50s and a UTI. PMH includes: CAD, CHF, DM II, HLD, HTN, NSTEMI, recurrent orthostatic hypotension and syncope, stroke, and TIA.  Clinical Impression   Pt in bed upon arrival of PT, agreeable to evaluation at this time. Prior to admission the pt was mobilizing with use of a RW and completing stair training while at [facility] for rehab following previous hospital admission. He reports he was independent with ADLs such as dressing and bathing at [facility]. The pt now presents with limitations in functional mobility, activity tolerance, and dynamic stability due to above dx, and will continue to benefit from skilled PT to address these deficits. The pt was able to demo good independence with bed mobility and initial OOB transfers, completing with minimal cues for hand positioning but no physical assist needed. Mobility was limited at this time due to orthostatic hypotension (see below), but the pt denied progression of sx. The pt will continue to benefit from skilled PT to further progress functional ambulation and tolerance for upright mobility to reduce risk of falls at home following d/c.   VITALS:  - supine - BP: 136/62 (81); HR: 72bpm - sitting EOB - BP: 118/66 (81); HR: 88bpm - standing - BP: 89/47 (59); HR: 95bpm - standing after 3 min - BP: 75/42 (53); HR: 98bpm - supine - 122/56 (78); HR: 80bpm *pt denies sx at all points     Follow Up Recommendations Home health PT;Supervision for mobility/OOB    Equipment Recommendations  Rolling walker with 5" wheels;3in1 (PT)    Recommendations for Other Services       Precautions / Restrictions Precautions Precautions: Fall Precaution Comments: watch BP Restrictions Weight  Bearing Restrictions: No RLE Weight Bearing: Weight bearing as tolerated LLE Weight Bearing: Weight bearing as tolerated      Mobility  Bed Mobility Overal bed mobility: Needs Assistance Bed Mobility: Sit to Supine;Supine to Sit     Supine to sit: Supervision Sit to supine: Supervision   General bed mobility comments: supervision for catheter, but no physical assist to come to sitting or adjust at EOB  Transfers Overall transfer level: Needs assistance Equipment used: Rolling walker (2 wheeled) Transfers: Sit to/from Stand Sit to Stand: Supervision         General transfer comment: supevision for safety, cues for hand placement on EOB, but no assist needed  Ambulation/Gait Ambulation/Gait assistance: Supervision Gait Distance (Feet): 10 Feet Assistive device: Rolling walker (2 wheeled) Gait Pattern/deviations: Step-to pattern   Gait velocity interpretation: <1.31 ft/sec, indicative of household ambulator General Gait Details: limited to lateral stepping along EOB due to soft BP with stand. Pt denies sx, but was able to tolerate x10 ft lateral stepping in each direction with RW and no physical assist needed to steady or manage RW     Balance Overall balance assessment: Needs assistance Sitting-balance support: Feet supported;No upper extremity supported Sitting balance-Leahy Scale: Good Sitting balance - Comments: supervision   Standing balance support: During functional activity;No upper extremity supported;Bilateral upper extremity supported Standing balance-Leahy Scale: Fair Standing balance comment: Needs UE support for dynamic tasks.  Pertinent Vitals/Pain Pain Assessment: No/denies pain    Home Living Family/patient expects to be discharged to:: Private residence Living Arrangements: Spouse/significant other Available Help at Discharge: Family;Available 24 hours/day Type of Home: House Home Access: Stairs to enter    [employer] of Steps: 1 step (in the house) Home Layout: Multi-level Home Equipment: Walker - 2 wheels;Grab bars - tub/shower Additional Comments: pt reports he may have a RW from his daughter, but otherwise no equipment. Reports he is about to have grab bars put in his shower    Prior Function Level of Independence: Needs assistance   Gait / Transfers Assistance Needed: pt from [facility], was using RW with therapy, doing stair training  ADL's / Homemaking Assistance Needed: reports independence with bathing and dressing but that he has only had sponge baths at [facility]        Hand Dominance   Dominant Hand: Right    Extremity/Trunk Assessment   Upper Extremity Assessment Upper Extremity Assessment: Overall WFL for tasks assessed    Lower Extremity Assessment Lower Extremity Assessment: Overall WFL for tasks assessed    Cervical / Trunk Assessment Cervical / Trunk Assessment: Normal  Communication   Communication: No difficulties  Cognition Arousal/Alertness: Awake/alert Behavior During Therapy: WFL for tasks assessed/performed Overall Cognitive Status: Within Functional Limits for tasks assessed                                 General Comments: Pt with minor inconsistencies in insight into current deficits. At he states he was using RW with supervision at [address], but is adamant he will return home without need for RW. Able to follow all instructions well and appropriate answers to all questions      General Comments General comments (skin integrity, edema, etc.): BP drops with change in position (136/62 - 75/42) but pt denies all sx    Exercises     Assessment/Plan    PT Assessment Patient needs continued PT services  PT Problem List Decreased strength;Decreased mobility;Decreased activity tolerance;Decreased balance;Decreased knowledge of use of DME       PT Treatment Interventions DME instruction;Therapeutic activities;Gait  training;Therapeutic exercise;Patient/family education;Balance training;Stair training;Functional mobility training;Neuromuscular re-education    PT Goals (Current goals can be found in the Care Plan section)  Acute Rehab PT Goals Patient Stated Goal: get back to independence with walking, go home PT Goal Formulation: With patient Time For Goal Achievement: 11/03/19 Potential to Achieve Goals: Good    Frequency Min 3X/week   Barriers to discharge           AM-PAC PT "6 Clicks" Mobility  Outcome Measure Help needed turning from your back to your side while in a flat bed without using bedrails?: None Help needed moving from lying on your back to sitting on the side of a flat bed without using bedrails?: None Help needed moving to and from a bed to a chair (including a wheelchair)?: None Help needed standing up from a chair using your arms (e.g., wheelchair or bedside chair)?: None Help needed to walk in hospital room?: A Little Help needed climbing 3-5 steps with a railing? : A Little 6 Click Score: 22    End of Session Equipment Utilized During Treatment: Gait belt Activity Tolerance: Patient tolerated treatment well;Other (comment) (orthostatic hypotension) Patient left: in bed;with bed alarm set;with call bell/phone within reach (no chair in room) Nurse Communication: Mobility status (orthostatics) PT Visit Diagnosis: Other  abnormalities of gait and mobility (R26.89);Muscle weakness (generalized) (M62.81);Other symptoms and signs involving the nervous system (R29.898)    Time: 5701-7793 PT Time Calculation (min) (ACUTE ONLY): 33 min   Charges:   PT Evaluation $PT Eval Moderate Complexity: 1 Mod PT Treatments $Gait Training: 8-22 mins        Karma Ganja, PT, DPT   Acute Rehabilitation Department Pager #: [phone]  Otho Bellows 10/20/2019, 4:02 PM

## 2019-10-21 DIAGNOSIS — N179 Acute kidney failure, unspecified: Secondary | ICD-10-CM

## 2019-10-21 DIAGNOSIS — N320 Bladder-neck obstruction: Secondary | ICD-10-CM

## 2019-10-21 DIAGNOSIS — I25118 Atherosclerotic heart disease of native coronary artery with other forms of angina pectoris: Secondary | ICD-10-CM

## 2019-10-21 DIAGNOSIS — I951 Orthostatic hypotension: Secondary | ICD-10-CM

## 2019-10-21 DIAGNOSIS — R001 Bradycardia, unspecified: Secondary | ICD-10-CM

## 2019-10-21 DIAGNOSIS — E785 Hyperlipidemia, unspecified: Secondary | ICD-10-CM

## 2019-10-21 DIAGNOSIS — R778 Other specified abnormalities of plasma proteins: Secondary | ICD-10-CM

## 2019-10-21 DIAGNOSIS — E119 Type 2 diabetes mellitus without complications: Secondary | ICD-10-CM

## 2019-10-21 LAB — GLUCOSE, CAPILLARY
Glucose-Capillary: 167 mg/dL — ABNORMAL HIGH (ref 70–99)
Glucose-Capillary: 198 mg/dL — ABNORMAL HIGH (ref 70–99)
Glucose-Capillary: 141 mg/dL — ABNORMAL HIGH (ref 70–99)
Glucose-Capillary: 190 mg/dL — ABNORMAL HIGH (ref 70–99)

## 2019-10-21 LAB — COMPREHENSIVE METABOLIC PANEL
ALT: 15 U/L (ref 0–44)
AST: 16 U/L (ref 15–41)
Albumin: 3 g/dL — ABNORMAL LOW (ref 3.5–5.0)
Alkaline Phosphatase: 32 U/L — ABNORMAL LOW (ref 38–126)
Anion gap: 9 (ref 5–15)
BUN: 10 mg/dL (ref 8–23)
CO2: 23 mmol/L (ref 22–32)
Calcium: 9 mg/dL (ref 8.9–10.3)
Chloride: 105 mmol/L (ref 98–111)
Creatinine, Ser: 1.02 mg/dL (ref 0.61–1.24)
GFR calc Af Amer: >60 mL/min (ref >60)
GFR calc non Af Amer: >60 mL/min (ref >60)
Glucose, Bld: 192 mg/dL — ABNORMAL HIGH (ref 70–99)
Potassium: 3.4 mmol/L — ABNORMAL LOW (ref 3.5–5.1)
Sodium: 137 mmol/L (ref 135–145)
Total Bilirubin: 0.5 mg/dL (ref 0.3–1.2)
Total Protein: 5.4 g/dL — ABNORMAL LOW (ref 6.5–8.1)

## 2019-10-21 LAB — CBC
HCT: 35.7 % — ABNORMAL LOW (ref 39.0–52.0)
Hemoglobin: 12.1 g/dL — ABNORMAL LOW (ref 13.0–17.0)
MCH: 28.1 pg (ref 26.0–34.0)
MCHC: 33.9 g/dL (ref 30.0–36.0)
MCV: 82.8 fL (ref 80.0–100.0)
Platelets: 136 10*3/uL — ABNORMAL LOW (ref 150–400)
RBC: 4.31 MIL/uL (ref 4.22–5.81)
RDW: 14.1 % (ref 11.5–15.5)
WBC: 6 10*3/uL (ref 4.0–10.5)
nRBC: 0 % (ref 0.0–0.2)

## 2019-10-21 LAB — MAGNESIUM: Magnesium: 1.5 mg/dL — ABNORMAL LOW (ref 1.7–2.4)

## 2019-10-21 MED ORDER — POTASSIUM CHLORIDE CRYS ER 20 MEQ PO TBCR
40.0000 meq | EXTENDED_RELEASE_TABLET | Freq: Once | ORAL | Status: AC
  Administered 2019-10-21: 40 meq via ORAL
  Filled 2019-10-21: qty 2

## 2019-10-21 MED ORDER — METOPROLOL TARTRATE 50 MG PO TABS
50.0000 mg | ORAL_TABLET | Freq: Two times a day (BID) | ORAL | Status: DC
  Administered 2019-10-21 – 2019-10-23 (×5): 50 mg via ORAL
  Filled 2019-10-21 (×5): qty 1

## 2019-10-21 MED ORDER — POLYETHYLENE GLYCOL 3350 17 G PO PACK
17.0000 g | PACK | Freq: Every day | ORAL | Status: DC | PRN
  Administered 2019-10-22: 17 g via ORAL
  Filled 2019-10-21: qty 1

## 2019-10-21 MED ORDER — LOSARTAN POTASSIUM 50 MG PO TABS
50.0000 mg | ORAL_TABLET | Freq: Every day | ORAL | Status: DC
  Administered 2019-10-21 – 2019-10-22 (×2): 50 mg via ORAL
  Filled 2019-10-21 (×2): qty 1

## 2019-10-21 MED ORDER — CHLORHEXIDINE GLUCONATE CLOTH 2 % EX PADS
6.0000 | MEDICATED_PAD | Freq: Every day | CUTANEOUS | Status: DC
  Administered 2019-10-21 – 2019-10-23 (×3): 6 via TOPICAL

## 2019-10-21 NOTE — Progress Notes (Addendum)
PROGRESS NOTE    Alexis Griffith  TFT:[id number] DOB: 1940-11-28 DOA: 10/20/2019 PCP: Haywood Pao, MD    Brief Narrative:  Alexis Griffith is a 79 y.o. 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  Recurrent syncope/hypotension presents to emergency department for evaluation of dizziness.  Patient tells me that his catheter was changed yesterday at urology office and he had severe pain at that time.  Reports that he noticed some foul-smelling yellowish-green discharge at catheter site.  He was started on 2 medications-Terazosin and finasteride by his urologist which he took this morning for the first time and after 30 minutes he became dizzy and fell backward and hit on wall however he denies loss of consciousness, head trauma, headache, blurry vision, chest pain, shortness of breath, fever, chills, cough, congestion, nausea, vomiting.  Due to persistent dizziness he brought to the emergency department for further evaluation and management.  Recently admitted 10/01/2019 with syncope and collapse/orthostatic hypotension and discharged to [facility] on 10/10/2019.  He is supposed to be discharged from nursing home today.  On 10/03/2019: Cardiology ordered 30-day event monitor for further evaluation of syncope.  As per nursing home staff: Patient has been refusing to put on his heart monitor and compression hose-he said look like there were too many wires.  No history of smoking, alcohol, listed drug use.  Upon arrival to ED: Patient blood pressure noted to be low, afebrile, no leukocytosis, lactic acid: WNL, CMP shows sodium of 134, AKI, troponin: 27, UA positive for nitrates  leukocytes and WBCs.  UC, BC, COVID-19: Pending.  Chest x-ray came back negative.  Code sepsis was called as patient blood pressure dropped in 50s and was given IV fluid bolus x1. Rocephin given.  [facility] hospitalist consulted for admission.   Assessment & Plan:   Principal Problem:   UTI (urinary tract infection) Active Problems:   Hyperlipidemia LDL goal <70   Sinus bradycardia on ECG   Bladder neck obstruction   CAD (coronary artery disease)   Diabetes mellitus type 2 in nonobese (HCC)   Elevated troponin   Hypotension   AKI (acute kidney injury) ([place])   UTI (urinary tract infection) due to urinary indwelling catheter (HCC)   Klebsiella pneumonia urinary tract infection complicated Recent Foley catheter exchange 10/19/2019 by urology, Dr. Tresa Moore.  Afebrile without leukocytosis.  Lactic acid within normal limits.  Urinalysis with large leukoesterase, positive nitrite, many bacteria and greater than 50 WBCs. --Blood cultures x2: Pending --Urine culture: >100K Klebsiella pneumonia, susceptibilities pending --Continue ceftriaxone  Acute renal failure: Resolved Creatinine 1.39 with a GFR 48 on admission.  Etiology likely secondary to ATN from hypotension versus prerenal azotemia from dehydration.  Received IV fluid hydration/bolus on arrival. --Creatinine 1.39>1.02 --Continue monitor BMP daily  Hypotension Orthostatic Etiology likely secondary to orthostatic hypotension from underlying polypharmacy with newly started Terazosin and finasteride.  Received IV fluid bolus on arrival.  Initially orthostatic on admission. --Continue to hold home Imdur --Continue finasteride and terazosin --Repeat orthostatic vital signs today now normal --Continue monitor blood pressure closely  Hx Essential HTN On metoprolol, losartan, ranolazine and Imdur at home. --BP now improved, will restart metoprolol 50 mg twice daily and losartan 50 mg daily --Hold home ranolazine and Imdur for now  Groin  yeast infection --Ketoconazole cream twice daily  Type 2 diabetes mellitus Hemoglobin A1c 7.1.  On insulin sliding scale outpatient.  Severe aortic stenosis s/p TAVR --Outpatient follow-up with cardiology  Chronic diastolic congestive heart failure, compensated TTE 09/10/2019 LVEF 60-65% with grade 1 diastolic dysfunction. --Holding Imdur secondary to orthostasis as above --Continue aspirin and statin  Carotid artery stenosis:  Right carotid artery stenosis, 40 to 59% on carotid ultrasound done on 6/21. --Continue aspirin, statin.  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  Pulmonary nodules: --Follow-up outpatient with PCCM  Dyslipidemia:  Continue fenofibrate 160 mg p.o. daily and Crestor 20 mg p.o. daily.  DVT prophylaxis: Lovenox/SCDs Code Status: Full code Family Communication: None present at bedside this morning  Disposition Plan:  Status is: Inpatient  Remains inpatient appropriate because:Ongoing diagnostic testing needed not appropriate for outpatient work up and IV treatments appropriate due to intensity of illness or inability to take PO   Dispo: The patient is from: SNF              Anticipated d/c is to: Home              Anticipated d/c date is: 2 days              Patient currently is not medically stable to d/c.   Consultants:   None  Procedures:   None  Antimicrobials:   Ceftriaxone 8/20>>   Subjective: Patient seen and examined bedside, resting comfortably.  Feels better today after IV fluid hydration and IV antibiotics.  States was being discharged from [facility] when he felt "off" yesterday and proceeded to seek further attention in the ED.  Discussed with patient that his blood pressure was low likely secondary to orthostasis/dehydration also highly considered new start of medications.  Repeat orthostatic vital signs this morning within normal limits.  No other questions or concerns at this time.   States will be returning home with home health when ready for discharge.  Denies headache, no fever/chills/night sweats, no nausea/vomiting/diarrhea, no chest pain, palpitations, no shortness of breath, no abdominal pain.  No acute events overnight per nursing staff.  Objective: Vitals:   10/21/19 0828 10/21/19 0831 10/21/19 0841 10/21/19 1214  BP:    (!) 163/51  Pulse: (!) 109 (!) 117 86 89  Resp:    16  Temp:    97.8 F (36.6 C)  TempSrc:    Oral  SpO2:    95%  Weight:      Height:        Intake/Output Summary (Last 24 hours) at 10/21/2019 1247 Last data filed at 10/21/2019 1137 Gross per 24 hour  Intake 1104.4 ml  Output 3225 ml  Net -2120.6 ml   Filed Weights   10/20/19 1028 10/21/19 0545  Weight: 79.4 kg 79.1 kg    Examination:  General exam: Appears calm and comfortable  Respiratory system: Clear to auscultation. Respiratory effort normal. Cardiovascular system: S1 & S2 heard, RRR. No JVD, murmurs, rubs, gallops or clicks. No pedal edema. Gastrointestinal system: Abdomen is nondistended, soft and nontender. No organomegaly or masses felt. Normal bowel sounds heard. GU: Foley catheter now in place draining clear yellow urine Central nervous system: Alert and oriented. No focal neurological deficits. Extremities: Symmetric 5 x 5 power. Skin: No rashes, lesions or ulcers Psychiatry: Judgement and insight appear normal. Mood & affect appropriate.     Data Reviewed: I have personally reviewed following labs and imaging studies  CBC: Recent  Labs  Lab 10/20/19 1113 10/20/19 1549 10/21/19 0327  WBC 9.4 7.4 6.0  HGB 13.0 12.7* 12.1*  HCT 38.3* 38.5* 35.7*  MCV 82.5 82.4 82.8  PLT 152 135* 850*   Basic Metabolic Panel: Recent Labs  Lab 10/20/19 1113 10/20/19 1549 10/21/19 0327  NA 134*  --  137  K 3.8  --  3.4*  CL 100  --  105  CO2 23  --  23  GLUCOSE 217*  --  192*  BUN 14  --  10  CREATININE 1.39* 1.22 1.02  CALCIUM 9.6  --  9.0  MG  --  1.6*  --    PHOS  --  3.8  --    GFR: Estimated Creatinine Clearance: 61.6 mL/min (by C-G formula based on SCr of 1.02 mg/dL). Liver Function Tests: Recent Labs  Lab 10/21/19 0327  AST 16  ALT 15  ALKPHOS 32*  BILITOT 0.5  PROT 5.4*  ALBUMIN 3.0*   No results for input(s): LIPASE, AMYLASE in the last 168 hours. No results for input(s): AMMONIA in the last 168 hours. Coagulation Profile: No results for input(s): INR, PROTIME in the last 168 hours. Cardiac Enzymes: No results for input(s): CKTOTAL, CKMB, CKMBINDEX, TROPONINI in the last 168 hours. BNP (last 3 results) No results for input(s): PROBNP in the last 8760 hours. HbA1C: Recent Labs    10/20/19 1549  HGBA1C 7.1*   CBG: Recent Labs  Lab 10/20/19 1553 10/20/19 2114 10/21/19 0544 10/21/19 1111  GLUCAP 191* 226* 167* 198*   Lipid Profile: No results for input(s): CHOL, HDL, LDLCALC, TRIG, CHOLHDL, LDLDIRECT in the last 72 hours. Thyroid Function Tests: No results for input(s): TSH, T4TOTAL, FREET4, T3FREE, THYROIDAB in the last 72 hours. Anemia Panel: No results for input(s): VITAMINB12, FOLATE, FERRITIN, TIBC, IRON, RETICCTPCT in the last 72 hours. Sepsis Labs: Recent Labs  Lab 10/20/19 1145 10/20/19 1549  LATICACIDVEN 0.9 1.2    Recent Results (from the past 240 hour(s))  Urine culture     Status: Abnormal (Preliminary result)   Collection Time: 10/20/19 11:13 AM   Specimen: Urine, Random  Result Value Ref Range Status   Specimen Description URINE, RANDOM  Final   Special Requests NONE  Final   Culture (A)  Final    >=100,000 COLONIES/mL KLEBSIELLA PNEUMONIAE SUSCEPTIBILITIES TO FOLLOW Performed at [facility], [place] [address], [place], [place] [address]    Report Status PENDING  Incomplete         Radiology Studies: DG Chest 2 View  Result Date: 10/20/2019 CLINICAL DATA:  Dizziness EXAM: CHEST - 2 VIEW COMPARISON:  October 01, 2019 FINDINGS: Trachea midline. Cardiomediastinal contours and  hilar structures are normal. Lungs are clear.  Signs of trans arterial aortic valve replacement. On limited assessment skeletal structures without acute process. IMPRESSION: No active cardiopulmonary disease. Electronically Signed   By: Zetta Bills M.D.   On: 10/20/2019 11:00        Scheduled Meds: . aspirin  81 mg Oral Daily  . Chlorhexidine Gluconate Cloth  6 each Topical Daily  . clonazepam  0.5 mg Oral QID  . enoxaparin (LOVENOX) injection  40 mg Subcutaneous Daily  . fenofibrate  160 mg Oral Daily  . insulin aspart  0-15 Units Subcutaneous TID WC  . insulin aspart  0-5 Units Subcutaneous QHS  . ketoconazole   Topical BID  . rosuvastatin  20 mg Oral Daily  . senna-docusate  1 tablet Oral Daily   Continuous Infusions: .  sodium chloride 100 mL/hr at 10/21/19 1135  . cefTRIAXone (ROCEPHIN)  IV       LOS: 1 day    Time spent: 38 minutes spent on chart review, discussion with nursing staff, consultants, updating family and interview/physical exam; more than 50% of that time was spent in counseling and/or coordination of care.    Gaylan Fauver J [place] DO [facility] Available via Epic secure chat 7am-7pm After these hours, please refer to coverage provider listed on [email or web address] 10/21/2019, 12:47 PM

## 2019-10-21 NOTE — Evaluation (Addendum)
Occupational Therapy Evaluation Patient Details Name: Alexis Griffith MRN: [id number] DOB: 08-16-1940 Today's Date: 10/21/2019    History of Present Illness 79 yo male presenting with persistent dizziness from [facility] for rehab. Upon work-up, the pt was found to have BP in the 50s and a UTI. PMH includes: CAD, CHF, DM II, HLD, HTN, NSTEMI, recurrent orthostatic hypotension and syncope, stroke, and TIA.   Clinical Impression   PTA, pt was at [facility] for rehab and was performing BADLs and using RW for mobility; prior to rehab, pt was at home living with his wife and was independent. Pt currently performing ADLs and functional mobility at [facility] A level with RW. Pt presenting with decreased balance, strength, and activity tolerance.  Recommend dc to home with HHOT for further OT to optimize safety, independence with ADLs, and return to PLOF. All further OT needs may be met with [facility] services and will sign off. Thank you.     Follow Up Recommendations  Home health OT;Supervision/Assistance - 24 hour    Equipment Recommendations  3 in 1 bedside commode (As shower seat)    Recommendations for Other Services PT consult     Precautions / Restrictions Precautions Precautions: Fall Precaution Comments: Orthostatics; NT having finished prior to OT session. Restrictions Weight Bearing Restrictions: No      Mobility Bed Mobility Overal bed mobility: Needs Assistance Bed Mobility: Supine to Sit;Sit to Supine     Supine to sit: Supervision Sit to supine: Supervision   General bed mobility comments: Supervision for safety  Transfers Overall transfer level: Needs assistance Equipment used: Rolling walker (2 wheeled) Transfers: Sit to/from Stand Sit to Stand: Min guard         General transfer comment: Min Guard A for safety    Balance Overall balance assessment: Needs assistance Sitting-balance support: Feet supported;No upper extremity supported Sitting balance-Leahy  Scale: Good     Standing balance support: No upper extremity supported;During functional activity Standing balance-Leahy Scale: Fair Standing balance comment: Performing ADLs at sink without LOB                           ADL either performed or assessed with clinical judgement   ADL Overall ADL's : Needs assistance/impaired Eating/Feeding: Modified independent   Grooming: Wash/dry face;Oral care;Supervision/safety;Set up;Standing   Upper Body Bathing: Supervision/ safety;Set up;Sitting   Lower Body Bathing: Min guard;Sit to/from stand   Upper Body Dressing : Set up;Supervision/safety;Sitting   Lower Body Dressing: Min guard;Sit to/from stand   Toilet Transfer: Min guard;Ambulation;RW (simulated in room)           Functional mobility during ADLs: Min guard;Rolling walker General ADL Comments: Pt presenting with decreased balance, strength, and activty tolerance.     Vision Baseline Vision/History: Wears glasses Wears Glasses: Reading only Patient Visual Report: No change from baseline       Perception     Praxis      Pertinent Vitals/Pain Pain Assessment: No/denies pain     Hand Dominance Right   Extremity/Trunk Assessment Upper Extremity Assessment Upper Extremity Assessment: Overall WFL for tasks assessed   Lower Extremity Assessment Lower Extremity Assessment: Defer to PT evaluation   Cervical / Trunk Assessment Cervical / Trunk Assessment: Normal   Communication Communication Communication: No difficulties   Cognition Arousal/Alertness: Awake/alert Behavior During Therapy: WFL for tasks assessed/performed Overall Cognitive Status: Impaired/Different from baseline Area of Impairment: Problem solving  Following Commands: Follows one step commands with increased time;Follows multi-step commands with increased time     Problem Solving: Slow processing General Comments: Pt requiring increased time and cues  during session.    General Comments  VSS on RA    Exercises     Shoulder Instructions      Home Living Family/patient expects to be discharged to:: Private residence Living Arrangements: Spouse/significant other Available Help at Discharge: Family;Available 24 hours/day Type of Home: House Home Access: Stairs to enter [employer] of Steps: 1 step (in the house)   Home Layout: One level Alternate Level Stairs-Number of Steps: sunken living room, otherwise level   Bathroom Shower/Tub: Occupational psychologist: Standard     Home Equipment: Environmental consultant - 2 wheels (Adding grab bars into shower)   Additional Comments: pt reports he may have a RW from his daughter, but otherwise no equipment. Reports he is about to have grab bars put in his shower      Prior Functioning/Environment Level of Independence: Needs assistance  Gait / Transfers Assistance Needed: Pt was at [facility] for rehab. Using RW with therapy and satff for mobility ADL's / Homemaking Assistance Needed: reports independence with bathing and dressing but that he has only had sponge baths at [facility]   Comments: Reports he has [facility] and [facility] since he has been at rehab. Plans for dc to home        OT Problem List: Decreased strength;Decreased knowledge of use of DME or AE;Decreased knowledge of precautions;Decreased activity tolerance;Impaired balance (sitting and/or standing);Decreased safety awareness;Cardiopulmonary status limiting activity      OT Treatment/Interventions:      OT Goals(Current goals can be found in the care plan section) Acute Rehab OT Goals Patient Stated Goal: get back to independence with walking, go home OT Goal Formulation: All assessment and education complete, DC therapy  OT Frequency:     Barriers to D/C: Decreased caregiver support          Co-evaluation              AM-PAC OT "6 Clicks" Daily Activity     Outcome Measure Help from  another person eating meals?: None Help from another person taking care of personal grooming?: A Little Help from another person toileting, which includes using toliet, bedpan, or urinal?: A Little Help from another person bathing (including washing, rinsing, drying)?: A Little Help from another person to put on and taking off regular upper body clothing?: A Little Help from another person to put on and taking off regular lower body clothing?: A Little 6 Click Score: 19   End of Session Equipment Utilized During Treatment: Gait belt;Rolling walker Nurse Communication: Mobility status  Activity Tolerance: Patient tolerated treatment well Patient left: in bed;with call bell/phone within reach;with bed alarm set  OT Visit Diagnosis: Unsteadiness on feet (R26.81);Muscle weakness (generalized) (M62.81)                Time: 5449-2010 OT Time Calculation (min): 33 min Charges:  OT General Charges $OT Visit: 1 Visit OT Evaluation $OT Eval Low Complexity: 1 Low OT Treatments $Self Care/Home Management : 8-22 mins  Surah Pelley MSOT, OTR/L Acute Rehab Pager: [phone] Office: [place] 10/21/2019, 12:29 PM

## 2019-10-22 LAB — BASIC METABOLIC PANEL
Anion gap: 9 (ref 5–15)
BUN: 6 mg/dL — ABNORMAL LOW (ref 8–23)
CO2: 27 mmol/L (ref 22–32)
Calcium: 9.3 mg/dL (ref 8.9–10.3)
Chloride: 101 mmol/L (ref 98–111)
Creatinine, Ser: 0.96 mg/dL (ref 0.61–1.24)
GFR calc Af Amer: >60 mL/min (ref >60)
GFR calc non Af Amer: >60 mL/min (ref >60)
Glucose, Bld: 227 mg/dL — ABNORMAL HIGH (ref 70–99)
Potassium: 3.7 mmol/L (ref 3.5–5.1)
Sodium: 137 mmol/L (ref 135–145)

## 2019-10-22 LAB — GLUCOSE, CAPILLARY
Glucose-Capillary: 134 mg/dL — ABNORMAL HIGH (ref 70–99)
Glucose-Capillary: 187 mg/dL — ABNORMAL HIGH (ref 70–99)
Glucose-Capillary: 241 mg/dL — ABNORMAL HIGH (ref 70–99)
Glucose-Capillary: 176 mg/dL — ABNORMAL HIGH (ref 70–99)

## 2019-10-22 LAB — URINE CULTURE: Culture: >=100000 — AB

## 2019-10-22 MED ORDER — RANOLAZINE ER 500 MG PO TB12
500.0000 mg | ORAL_TABLET | Freq: Two times a day (BID) | ORAL | Status: DC
  Administered 2019-10-22 – 2019-10-23 (×3): 500 mg via ORAL
  Filled 2019-10-22 (×5): qty 1

## 2019-10-22 MED ORDER — FINASTERIDE 5 MG PO TABS
5.0000 mg | ORAL_TABLET | Freq: Every day | ORAL | Status: DC
  Administered 2019-10-23: 5 mg via ORAL
  Filled 2019-10-22 (×2): qty 1

## 2019-10-22 MED ORDER — ISOSORBIDE MONONITRATE ER 30 MG PO TB24
30.0000 mg | ORAL_TABLET | Freq: Every day | ORAL | Status: DC
  Administered 2019-10-22 – 2019-10-23 (×2): 30 mg via ORAL
  Filled 2019-10-22 (×2): qty 1

## 2019-10-22 MED ORDER — LOSARTAN POTASSIUM 50 MG PO TABS
100.0000 mg | ORAL_TABLET | Freq: Every day | ORAL | Status: DC
  Administered 2019-10-23: 100 mg via ORAL
  Filled 2019-10-22: qty 2

## 2019-10-22 MED ORDER — LOSARTAN POTASSIUM 50 MG PO TABS
50.0000 mg | ORAL_TABLET | Freq: Once | ORAL | Status: AC
  Administered 2019-10-22: 50 mg via ORAL
  Filled 2019-10-22: qty 1

## 2019-10-22 NOTE — Progress Notes (Addendum)
PROGRESS NOTE    Alexis Griffith  JOI:[id number] DOB: 01/05/1941 DOA: 10/20/2019 PCP: Haywood Pao, MD    Brief Narrative:  Alexis Griffith is a 79 y.o. 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  Recurrent syncope/hypotension presents to emergency department for evaluation of dizziness.  Patient tells me that his catheter was changed yesterday at urology office and he had severe pain at that time.  Reports that he noticed some foul-smelling yellowish-green discharge at catheter site.  He was started on 2 medications-Terazosin and finasteride by his urologist which he took this morning for the first time and after 30 minutes he became dizzy and fell backward and hit on wall however he denies loss of consciousness, head trauma, headache, blurry vision, chest pain, shortness of breath, fever, chills, cough, congestion, nausea, vomiting.  Due to persistent dizziness he brought to the emergency department for further evaluation and management.  Recently admitted 10/01/2019 with syncope and collapse/orthostatic hypotension and discharged to [facility] on 10/10/2019.  He is supposed to be discharged from nursing home today.  On 10/03/2019: Cardiology ordered 30-day event monitor for further evaluation of syncope.  As per nursing home staff: Patient has been refusing to put on his heart monitor and compression hose-he said look like there were too many wires.  No history of smoking, alcohol, listed drug use.  Upon arrival to ED: Patient blood pressure noted to be low, afebrile, no leukocytosis, lactic acid: WNL, CMP shows sodium of 134, AKI, troponin: 27, UA positive for nitrates  leukocytes and WBCs.  UC, BC, COVID-19: Pending.  Chest x-ray came back negative.  Code sepsis was called as patient blood pressure dropped in 50s and was given IV fluid bolus x1. Rocephin given.  [facility] hospitalist consulted for admission.   Assessment & Plan:   Principal Problem:   UTI (urinary tract infection) Active Problems:   Hyperlipidemia LDL goal <70   Sinus bradycardia on ECG   Bladder neck obstruction   CAD (coronary artery disease)   Diabetes mellitus type 2 in nonobese (HCC)   Elevated troponin   Hypotension   AKI (acute kidney injury) ([place])   UTI (urinary tract infection) due to urinary indwelling catheter ([place])   Klebsiella pneumonia urinary tract infection complicated Sepsis: Ruled out Recent Foley catheter exchange 10/19/2019 by urology, Dr. Tresa Moore.  Afebrile without leukocytosis.  Lactic acid within normal limits.  Urinalysis with large leukoesterase, positive nitrite, many bacteria and greater than 50 WBCs. --Blood cultures x2: No growth x2 days --Urine culture: >100K Klebsiella pneumonia, susceptibilities pending --Continue ceftriaxone  Acute renal failure: Resolved Creatinine 1.39 with a GFR 48 on admission.  Etiology likely secondary to ATN from hypotension versus prerenal azotemia from dehydration.  Received IV fluid hydration/bolus on arrival. --Creatinine 1.39>1.02>0.96 --Continue monitor BMP daily  Orthostatic hypotension Etiology likely secondary to orthostatic hypotension from underlying polypharmacy with newly started Terazosin and finasteride.  Received IV fluid bolus on arrival.  Initially orthostatic on admission. --Discontinue terazosin --Repeat orthostatic vital signs today now normal --Continue monitor blood pressure closely  Hx Essential HTN Resumed home metoprolol, ranolazine and Imdur. --BP continues to still be elevated, 175/67 --Increase losartan to 100 mg p.o. daily --Continue to monitor blood pressure closely  Groin yeast  infection --Ketoconazole cream twice  daily  Type 2 diabetes mellitus Hemoglobin A1c 7.1.  On insulin sliding scale outpatient.  Severe aortic stenosis s/p TAVR --Outpatient follow-up with cardiology  Chronic diastolic congestive heart failure, compensated TTE 09/10/2019 LVEF 60-65% with grade 1 diastolic dysfunction. --Continue Imdur, metoprolol, losartan, ranolazine --Continue aspirin and statin  Carotid artery stenosis:  Right carotid artery stenosis, 40 to 59% on carotid ultrasound done on 6/21. --Continue aspirin, statin.  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  Pulmonary nodules: --Follow-up outpatient with pulmonology  Dyslipidemia:  Continue fenofibrate 160 mg p.o. daily and Crestor 20 mg p.o. daily.  DVT prophylaxis: Lovenox/SCDs Code Status: Full code Family Communication: None present at bedside this morning  Disposition Plan:  Status is: Inpatient  Remains inpatient appropriate because:Ongoing diagnostic testing needed not appropriate for outpatient work up and IV treatments appropriate due to intensity of illness or inability to take PO   Dispo: The patient is from: SNF              Anticipated d/c is to: Home              Anticipated d/c date is: 1 day              Patient currently is not medically stable to d/c.   Consultants:   None  Procedures:   None  Antimicrobials:   Ceftriaxone 8/20>>   Subjective: Patient seen and examined bedside, resting comfortably.  Poor sleep overnight, no other specific complaints at this time.  Concerned about his continued elevated blood pressure.  Resuming his home antihypertensive regimen and increasing dose of losartan. No other questions or concerns at this time.  Continues to reiterate that he will be returning home with home health when ready for discharge.  Denies headache, no fever/chills/night sweats, no nausea/vomiting/diarrhea, no chest pain, palpitations, no  shortness of breath, no abdominal pain.  No acute events overnight per nursing staff.  Objective: Vitals:   10/22/19 0500 10/22/19 0539 10/22/19 0825 10/22/19 1122  BP:  (!) 175/67 (!) 176/60 (!) 179/68  Pulse:  70 77 76  Resp:  16  18  Temp:  98.1 F (36.7 C)  97.8 F (36.6 C)  TempSrc:  Oral  Oral  SpO2:  96%  95%  Weight: 79.1 kg     Height:        Intake/Output Summary (Last 24 hours) at 10/22/2019 1307 Last data filed at 10/22/2019 0839 Gross per 24 hour  Intake 220 ml  Output 6225 ml  Net -6005 ml   Filed Weights   10/20/19 1028 10/21/19 0545 10/22/19 0500  Weight: 79.4 kg 79.1 kg 79.1 kg    Examination:  General exam: Appears calm and comfortable  Respiratory system: Clear to auscultation. Respiratory effort normal. Cardiovascular system: S1 & S2 heard, RRR. No JVD, murmurs, rubs, gallops or clicks. No pedal edema. Gastrointestinal system: Abdomen is nondistended, soft and nontender. No organomegaly or masses felt. Normal bowel sounds heard. GU: Foley catheter now in place draining clear yellow urine Central nervous system: Alert and oriented. No focal neurological deficits. Extremities: Symmetric 5 x 5 power. Skin: No rashes, lesions or ulcers Psychiatry: Judgement and insight appear normal. Mood & affect appropriate.     Data Reviewed: I have personally reviewed following labs and imaging studies  CBC: Recent Labs  Lab 10/20/19 1113 10/20/19 1549 10/21/19 0327  WBC 9.4 7.4 6.0  HGB 13.0 12.7* 12.1*  HCT 38.3* 38.5* 35.7*  MCV 82.5 82.4 82.8  PLT 152 135* 175*   Basic Metabolic Panel: Recent Labs  Lab 10/20/19 1113 10/20/19 1549 10/21/19 0327 10/21/19 1433 10/22/19 0751  NA 134*  --  137  --  137  K 3.8  --  3.4*  --  3.7  CL 100  --  105  --  101  CO2 23  --  23  --  27  GLUCOSE 217*  --  192*  --  227*  BUN 14  --  10  --  6*  CREATININE 1.39* 1.22 1.02  --  0.96  CALCIUM 9.6  --  9.0  --  9.3  MG  --  1.6*  --  1.5*  --   PHOS  --   3.8  --   --   --    GFR: Estimated Creatinine Clearance: 65.5 mL/min (by C-G formula based on SCr of 0.96 mg/dL). Liver Function Tests: Recent Labs  Lab 10/21/19 0327  AST 16  ALT 15  ALKPHOS 32*  BILITOT 0.5  PROT 5.4*  ALBUMIN 3.0*   No results for input(s): LIPASE, AMYLASE in the last 168 hours. No results for input(s): AMMONIA in the last 168 hours. Coagulation Profile: No results for input(s): INR, PROTIME in the last 168 hours. Cardiac Enzymes: No results for input(s): CKTOTAL, CKMB, CKMBINDEX, TROPONINI in the last 168 hours. BNP (last 3 results) No results for input(s): PROBNP in the last 8760 hours. HbA1C: Recent Labs    10/20/19 1549  HGBA1C 7.1*   CBG: Recent Labs  Lab 10/21/19 1111 10/21/19 1610 10/21/19 2109 10/22/19 0538 10/22/19 1130  GLUCAP 198* 141* 190* 134* 187*   Lipid Profile: No results for input(s): CHOL, HDL, LDLCALC, TRIG, CHOLHDL, LDLDIRECT in the last 72 hours. Thyroid Function Tests: No results for input(s): TSH, T4TOTAL, FREET4, T3FREE, THYROIDAB in the last 72 hours. Anemia Panel: No results for input(s): VITAMINB12, FOLATE, FERRITIN, TIBC, IRON, RETICCTPCT in the last 72 hours. Sepsis Labs: Recent Labs  Lab 10/20/19 1145 10/20/19 1549  LATICACIDVEN 0.9 1.2    Recent Results (from the past 240 hour(s))  Urine culture     Status: Abnormal   Collection Time: 10/20/19 11:13 AM   Specimen: Urine, Random  Result Value Ref Range Status   Specimen Description URINE, RANDOM  Final   Special Requests   Final    NONE Performed at [facility], [address] [place], [place] [address]    Culture >=100,000 COLONIES/mL KLEBSIELLA PNEUMONIAE (A)  Final   Report Status 10/22/2019 FINAL  Final   Organism ID, Bacteria KLEBSIELLA PNEUMONIAE (A)  Final      Susceptibility   Klebsiella pneumoniae - MIC*    AMPICILLIN RESISTANT Resistant     CEFAZOLIN <=4 SENSITIVE Sensitive     CEFTRIAXONE <=0.25 SENSITIVE Sensitive      CIPROFLOXACIN <=0.25 SENSITIVE Sensitive     GENTAMICIN <=1 SENSITIVE Sensitive     IMIPENEM <=0.25 SENSITIVE Sensitive     NITROFURANTOIN 32 SENSITIVE Sensitive     TRIMETH/SULFA <=20 SENSITIVE Sensitive     AMPICILLIN/SULBACTAM 4 SENSITIVE Sensitive     PIP/TAZO <=4 SENSITIVE Sensitive     * >=100,000 COLONIES/mL KLEBSIELLA PNEUMONIAE  Culture, blood (routine x 2)     Status: None (Preliminary result)   Collection Time: 10/20/19 11:45 AM   Specimen: BLOOD  Result Value Ref Range Status   Specimen Description BLOOD LEFT ANTECUBITAL  Final   Special Requests   Final    BOTTLES DRAWN AEROBIC  AND ANAEROBIC Blood Culture adequate volume   Culture   Final    NO GROWTH 2 DAYS Performed at [facility], [place] [address] [place], [place] [address]    Report Status PENDING  Incomplete  Culture, blood (routine x 2)     Status: None (Preliminary result)   Collection Time: 10/20/19  3:49 PM   Specimen: BLOOD RIGHT HAND  Result Value Ref Range Status   Specimen Description BLOOD RIGHT HAND  Final   Special Requests   Final    BOTTLES DRAWN AEROBIC ONLY Blood Culture results may not be optimal due to an inadequate volume of blood received in culture bottles   Culture   Final    NO GROWTH 2 DAYS Performed at [facility], [place] [address], [place], [place] [address]    Report Status PENDING  Incomplete         Radiology Studies: No results found.      Scheduled Meds: . aspirin  81 mg Oral Daily  . Chlorhexidine Gluconate Cloth  6 each Topical Daily  . clonazepam  0.5 mg Oral QID  . enoxaparin (LOVENOX) injection  40 mg Subcutaneous Daily  . fenofibrate  160 mg Oral Daily  . finasteride  5 mg Oral Daily  . insulin aspart  0-15 Units Subcutaneous TID WC  . insulin aspart  0-5 Units Subcutaneous QHS  . isosorbide mononitrate  30 mg Oral Daily  . ketoconazole   Topical BID  . [START ON 10/23/2019] losartan  100 mg Oral Daily  . losartan  50 mg Oral Once  . metoprolol  tartrate  50 mg Oral BID  . ranolazine  500 mg Oral BID  . rosuvastatin  20 mg Oral Daily  . senna-docusate  1 tablet Oral Daily   Continuous Infusions: . sodium chloride 100 mL/hr at 10/22/19 0836  . cefTRIAXone (ROCEPHIN)  IV 2 g (10/21/19 1304)     LOS: 2 days    Time spent: 35 minutes spent on chart review, discussion with nursing staff, consultants, updating family and interview/physical exam; more than 50% of that time was spent in counseling and/or coordination of care.    Dontaye Hur J [place] DO [facility] Available via Epic secure chat 7am-7pm After these hours, please refer to coverage provider listed on [email or web address] 10/22/2019, 1:07 PM

## 2019-10-23 LAB — GLUCOSE, CAPILLARY
Glucose-Capillary: 148 mg/dL — ABNORMAL HIGH (ref 70–99)
Glucose-Capillary: 210 mg/dL — ABNORMAL HIGH (ref 70–99)

## 2019-10-23 MED ORDER — FINASTERIDE 5 MG PO TABS: 5.0000 mg | ORAL_TABLET | Freq: Every day | ORAL | 0 refills | Status: AC

## 2019-10-23 MED ORDER — CEFDINIR 300 MG PO CAPS: 300.0000 mg | ORAL_CAPSULE | Freq: Two times a day (BID) | ORAL | 0 refills | Status: AC

## 2019-10-23 MED ORDER — LOSARTAN POTASSIUM-HCTZ 100-25 MG PO TABS: 1.0000 | ORAL_TABLET | Freq: Every day | ORAL | 0 refills | Status: DC

## 2019-10-23 MED ORDER — METOPROLOL TARTRATE 50 MG PO TABS: 50.0000 mg | ORAL_TABLET | Freq: Two times a day (BID) | ORAL | 0 refills | Status: DC

## 2019-10-23 MED ORDER — NITROGLYCERIN 0.4 MG SL SUBL: 0.4000 mg | SUBLINGUAL_TABLET | SUBLINGUAL | 0 refills | Status: DC | PRN

## 2019-10-23 NOTE — Progress Notes (Signed)
Physical Therapy Treatment Patient Details Name: Alexis Griffith MRN: [id number] DOB: [birth year] Today's Date: 10/23/2019    History of Present Illness 79 yo male presenting with persistent dizziness from [facility] for rehab. Upon work-up, the pt was found to have BP in the 50s and a UTI. PMH includes: CAD, CHF, DM II, HLD, HTN, NSTEMI, recurrent orthostatic hypotension and syncope, stroke, and TIA.    PT Comments    The pt is making good progress with mobility and activity tolerance at this time as he was able to progress to hallway ambulation with stable BP and no reports of symptoms related to orthostatic hypotension. The pt was able to demo good stability and independence with all transfers and required only supervision with ambulation for safety. He reports he feels well enough to return home with continued HHPT and assist of his wife and I am in agreement. He currently benefits from use of a RW, but was able to manage the RW and did not have any LOB or need for assist while using the device. The pt will continue to benefit from skilled PT to progress dynamic stability and endurance, but is safe to return home with his wife when medically ready.     Follow Up Recommendations  Home health PT;Supervision for mobility/OOB     Equipment Recommendations  Rolling walker with 5" wheels;3in1 (PT)    Recommendations for Other Services       Precautions / Restrictions Precautions Precautions: Fall Precaution Comments: orthostatics but asymptomatic Restrictions Weight Bearing Restrictions: No    Mobility  Bed Mobility Overal bed mobility: Needs Assistance Bed Mobility: Supine to Sit;Sit to Supine Rolling: Supervision   Supine to sit: Supervision     General bed mobility comments: Supervision for safety, foley management  Transfers Overall transfer level: Needs assistance Equipment used: Rolling walker (2 wheeled) Transfers: Sit to/from Stand Sit to Stand: Supervision          General transfer comment: supervision for safety, no instability or assist needed  Ambulation/Gait Ambulation/Gait assistance: Supervision Gait Distance (Feet): 150 Feet Assistive device: Rolling walker (2 wheeled) Gait Pattern/deviations: Step-through pattern;Decreased stride length Gait velocity: 0.5 m/s   General Gait Details: No evidence of instability or sx.   Stairs             Wheelchair Mobility    Modified Rankin (Stroke Patients Only)       Balance Overall balance assessment: Needs assistance Sitting-balance support: Feet supported;No upper extremity supported Sitting balance-Leahy Scale: Good Sitting balance - Comments: supervision   Standing balance support: No upper extremity supported;During functional activity Standing balance-Leahy Scale: Fair Standing balance comment: able to don mask without UE support                            Cognition Arousal/Alertness: Awake/alert Behavior During Therapy: WFL for tasks assessed/performed Overall Cognitive Status: Impaired/Different from baseline Area of Impairment: Problem solving                             Problem Solving: Slow processing General Comments: Pt requiring increased time and cues during session but is agreeable to all mobility and all answers/questions are appropriate      Exercises      General Comments General comments (skin integrity, edema, etc.): Slight drop in BP with changes in position, but hled steady following ambulation. Pt denies sx.  Pertinent Vitals/Pain Pain Assessment: No/denies pain Pain Intervention(s): Monitored during session           PT Goals (current goals can now be found in the care plan section) Acute Rehab PT Goals Patient Stated Goal: get back to independence with walking, go home PT Goal Formulation: With patient Time For Goal Achievement: 11/03/19 Potential to Achieve Goals: Good Additional Goals Additional Goal #1:  DGI > 19/21 to indicate reduced risk of falls Progress towards PT goals: Progressing toward goals    Frequency    Min 3X/week      PT Plan Current plan remains appropriate       AM-PAC PT "6 Clicks" Mobility   Outcome Measure  Help needed turning from your back to your side while in a flat bed without using bedrails?: None Help needed moving from lying on your back to sitting on the side of a flat bed without using bedrails?: None Help needed moving to and from a bed to a chair (including a wheelchair)?: None Help needed standing up from a chair using your arms (e.g., wheelchair or bedside chair)?: None Help needed to walk in hospital room?: A Little Help needed climbing 3-5 steps with a railing? : A Little 6 Click Score: 22    End of Session Equipment Utilized During Treatment: Gait belt Activity Tolerance: Patient tolerated treatment well Patient left: in bed;with bed alarm set;with call bell/phone within reach (no chair in room) Nurse Communication: Mobility status PT Visit Diagnosis: Other abnormalities of gait and mobility (R26.89);Muscle weakness (generalized) (M62.81);Other symptoms and signs involving the nervous system (E56.314)     Time: 9702-6378 PT Time Calculation (min) (ACUTE ONLY): 33 min  Charges:  $Gait Training: 23-37 mins                     Karma Ganja, PT, DPT   Acute Rehabilitation Department Pager #: [phone]   Otho Bellows 10/23/2019, 1:43 PM

## 2019-10-23 NOTE — TOC Initial Note (Signed)
Transition of Care [facility]) - Initial/Assessment Note    Patient Details  Name: Alexis Griffith MRN: [id number] Date of Birth: 11/18/1940  Transition of Care [facility]) CM/SW Contact:    Marilu Favre, RN Phone Number: 10/23/2019, 10:26 AM  Clinical Narrative:                 Discussed PT recommendations with patient. Patient from home with wife. Has walker at home does not want a 3 in 1.   Patient was arranged with home health through [facility] from [place] and wants to stay with [facility]. Orders entered and [place] with [facility] aware.   Expected Discharge Plan: [place] Barriers to Discharge: No Barriers Identified   Patient Goals and CMS Choice Patient states their goals for this hospitalization and ongoing recovery are:: To return to home CMS Medicare.gov Compare Post Acute Care list provided to:: Patient Choice offered to / list presented to : Patient  Expected Discharge Plan and Services Expected Discharge Plan: [place]   Discharge Planning Services: CM Consult Post Acute Care Choice: [place] arrangements for the past 2 months: Single Family Home Expected Discharge Date: 10/23/19               DME Arranged: N/A DME Agency: NA       HH Arranged: RN, PT, OT HH Agency: [place] Date [facility] Agency Contacted: 10/23/19 Time HH Agency Contacted: 52 Representative spoke with at [place]: Tommi Rumps  Prior Living Arrangements/Services Living arrangements for the past 2 months: [place] Lives with:: Spouse Patient language and need for interpreter reviewed:: Yes Do you feel safe going back to the place where you live?: Yes      Need for Family Participation in Patient Care: Yes (Comment) Care giver support system in place?: Yes (comment) Current home services: DME Criminal Activity/Legal Involvement Pertinent to Current Situation/Hospitalization: No - Comment as needed  Activities of Daily Living Home  Assistive Devices/Equipment: None ADL Screening (condition at time of admission) Patient's cognitive ability adequate to safely complete daily activities?: Yes Is the patient deaf or have difficulty hearing?: No Does the patient have difficulty seeing, even when wearing glasses/contacts?: No Does the patient have difficulty concentrating, remembering, or making decisions?: No Patient able to express need for assistance with ADLs?: Yes Does the patient have difficulty dressing or bathing?: Yes Independently performs ADLs?: Yes (appropriate for developmental age) Does the patient have difficulty walking or climbing stairs?: No Weakness of Legs: None Weakness of Arms/Hands: None  Permission Sought/Granted   Permission granted to share information with : No              Emotional Assessment Appearance:: Appears stated age Attitude/Demeanor/Rapport: Engaged Affect (typically observed): Accepting Orientation: : Oriented to Self, Oriented to Place, Oriented to  Time, Oriented to Situation Alcohol / Substance Use: Not Applicable Psych Involvement: No (comment)  Admission diagnosis:  UTI (urinary tract infection) due to urinary indwelling catheter ([place]) [X91.478G, N39.0] Urinary tract infection associated with indwelling urethral catheter, initial encounter ([place]) [N56.213Y, N39.0] Sepsis without acute organ dysfunction, due to unspecified organism [facility]) [A41.9] Patient Active Problem List   Diagnosis Date Noted  . UTI (urinary tract infection) due to urinary indwelling catheter ([place]) 10/20/2019  . UTI (urinary tract infection)   . Syncope   . Syncope and collapse 10/01/2019  . Abdominal pain   . CHF (congestive heart failure) ([place]) 09/25/2019  . Demand ischemia ([place])  09/22/2019  . Premature atrial contractions 09/12/2019  . Urinary retention 09/11/2019  . Mild anemia 09/11/2019  . Hypomagnesemia 09/11/2019  . NSTEMI (non-ST elevated myocardial infarction) ([place]) 09/09/2019  .  Chest pain   . Hypertensive urgency   . Acute on chronic diastolic heart failure ([place]) 08/15/2019  . 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 Diabetes mellitus type 2 in nonobese (HCC)   . Severe aortic stenosis   . Sleep apnea   . Renal mass   . CAD (coronary artery disease) 07/04/2019  . 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 Excessive urination at night 06/05/2013  . TIA (transient ischemic attack) 07/27/2012  . Giant cell arteritis ([place]) 05/17/2012  . Carotid artery disease ([place]) 08/24/2011  . PVC's (premature ventricular contractions)   . Labile hypertension   . Hyperlipidemia LDL goal <70   . Anxiety   . Sinus bradycardia on ECG    PCP:  Tisovec, Fransico Him, MD Pharmacy:   [facility], [address], [address] [place], [place] [address] Phone: [phone] Fax: [place], [address] [place] [address] Phone: [phone] Fax: [place], [place] [address] Phone: [phone] Fax: [phone]     Social Determinants of Health (SDOH) Interventions    Readmission Risk Interventions No flowsheet data found.

## 2019-10-23 NOTE — Progress Notes (Signed)
Discharge teaching complete. Meds, diet, activity and follow up appointments reviewed and all questions answered. Copy of instructions given to patient and prescriptions sent to pharmacy. I spoke with [facility] and the patients wife brought in his home list and I tried to clarify discharge meds with the patient to the best of my ability. Patient and wife voiced understanding of discharge meds. Patient discharged home via wheelchair with wife.

## 2019-10-23 NOTE — Care Management Important Message (Signed)
Important Message  Patient Details  Name: Alexis Griffith MRN: [id number] Date of Birth: 08/15/1940   Medicare Important Message Given:  Yes - Important Message mailed due to current [place] Emergency  Verbal consent obtained due to current National Emergency  Relationship to patient: Self Contact Name: [name] Call Date: 10/23/19  Time: 4045 Phone: [phone] Outcome: Spoke with contact Important Message mailed to: Patient address on file    Delorse Lek 10/23/2019, 1:33 PM

## 2019-10-23 NOTE — Discharge Summary (Addendum)
Physician Discharge Summary  Alexis Griffith ZHG:[id number] DOB: 05/26/1940 DOA: 10/20/2019  PCP: Haywood Pao, MD  Admit date: 10/20/2019 Discharge date: 10/23/2019  Admitted From: [facility] Disposition: Home with home health  Recommendations for Outpatient Follow-up:  1. Follow up with PCP in 1-2 weeks 2. Follow-up with urology, Dr. Bess Harvest as scheduled 3. Continue antibiotics with cefdinir 300 mg p.o. twice daily to complete 19-QQI course for complicated UTI secondary to urinary retention requiring Foley catheter 4. Discontinued Terazosin secondary to orthostasis 5. Follow-up with cardiology as scheduled 6. Follow-up finalized blood cultures which are pending at time of discharge, no growth x3 days 7. Recommend referral to pulmonology for evaluation of pulmonary nodules, consider outpatient PET-CT since findings concerning for malignancy given the likely renal malignancy. 8. 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 Recurrent syncope/hypotension presents to emergency department for evaluation of dizziness.  Patient tells me that his catheter was changed yesterday at urology office and he had severe pain atthat time. Reports that he noticed some foul-smellingyellowish-greendischarge at catheter site. He was  started on 2 medications-Terazosin and finasterideby his urologist which he took this morning for the first time and after 30 minutes he became dizzy and fell backward and hit on wall however he denies loss of consciousness, head trauma, headache, blurry vision, chest pain, shortness of breath, fever, chills, cough, congestion, nausea, vomiting. Due to persistent dizziness he brought to the emergency department for further evaluation and management.  Recently admitted 10/01/2019 with syncope and collapse/orthostatic hypotension and [facility]. He is supposed to be discharged from nursing home today.  On 10/03/2019: Cardiology ordered 30-day event monitor for further evaluation of syncope. As per nursing home staff: Patient has been refusing to put on his heart monitor and compression hose-he saidlook like there were too many wires.  Upon arrival to ED: Patient blood pressure noted to be low, afebrile, no leukocytosis, lactic acid: WNL, CMP shows sodium of 134, AKI, troponin: 27, UA positive for nitrates leukocytes and WBCs. UC, BC, COVID-19: Pending. Chest x-ray came back negative. Code sepsis was called as patient blood pressure dropped in 50s and was given IV fluid bolus x1. Rocephin given. [facility] hospitalist consulted for admission.  Hospital course:  Klebsiella pneumonia urinary tract infection complicated Sepsis: Ruled out Recent Foley catheter exchange 10/19/2019 by urology, Dr. Tresa Moore.  Afebrile without leukocytosis.  Lactic acid within normal limits.  Urinalysis with large leukoesterase, positive nitrite, many bacteria and greater than 50 WBCs.  Urine culture with Klebsiella pneumonia with only resistance to ampicillin.  Was started on ceftriaxone and will transition to cefdinir 300 mg p.o. twice daily to complete a 29-NLG course for complicated UTI secondary to Foley catheter.  Blood cultures showed no growth x3 days at time of discharge.  Follow-up with  urology as scheduled.  Acute renal failure: Resolved Creatinine 1.39 with a GFR 48 on admission.  Etiology likely secondary to ATN from hypotension versus prerenal azotemia from  dehydration. Received IV fluid hydration/bolus on arrival with improvement of creatinine to 0.96 at time of discharge.  Orthostatic hypotension Etiology likely secondary to orthostatic hypotension from underlying polypharmacy with newly started Terazosin and finasteride.  Received IV fluid bolus on arrival.  Initially orthostatic on admission. Discontinue terazosin.  Can continue finasteride.  Repeat orthostatic vital signs within normal limits following IV fluid hydration and Terrazas and discontinuation.  Hx Essential HTN Resumed home metoprolol 50 mg p.o. BID, losartan/HCTZ 100-25 mg p.o. daily and Imdur.  Follow-up with cardiology as scheduled.  Type 2 diabetes mellitus Hemoglobin A1c 7.1.  On insulin sliding scale outpatient.  Severe aortic stenosis s/p TAVR Outpatient follow-up with cardiology  Chronic diastolic congestive heart failure, compensated TTE 09/10/2019 LVEF 60-65% with grade 1 diastolic dysfunction. Continue Imdur, metoprolol, losartan, aspirin and statin.  Carotid artery stenosis:  Right carotid artery stenosis, 40 to 59% on carotid ultrasound done on 6/21. Continue aspirin, statin.  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 Tresa Moore outpatient.  Pulmonary nodules: Findings of pulmonary nodules on CT angiogram chest 07/14/2019 with largest measuring 1.3 x 0.9 cm.  Follow-up outpatient with pulmonology of PET-CT for concerns of malignancy.  Dyslipidemia:  Continue fenofibrate 160 mg p.o. daily and Crestor 20 mg p.o. daily.  Discharge Diagnoses:  Principal Problem:   UTI (urinary tract infection) Active Problems:   Hyperlipidemia LDL goal <70   Sinus bradycardia on ECG   Bladder neck obstruction   CAD (coronary artery disease)   Diabetes mellitus type 2  in nonobese [facility])   UTI (urinary tract infection) due to urinary indwelling catheter [facility])    Discharge Instructions  Discharge Instructions    Call MD for:  extreme fatigue   Complete by: As directed    Call MD for:  persistant dizziness or light-headedness   Complete by: As directed    Call MD for:  persistant nausea and vomiting   Complete by: As directed    Call MD for:  redness, tenderness, or signs of infection (pain, swelling, redness, odor or green/yellow discharge around incision site)   Complete by: As directed    Call MD for:  severe uncontrolled pain   Complete by: As directed    Call MD for:  temperature >100.4   Complete by: As directed    Continue foley catheter   Complete by: As directed    Diet - low sodium heart healthy   Complete by: As directed    Increase activity slowly   Complete by: As directed    No wound care   Complete by: As directed      Allergies as of 10/23/2019      Reactions   Macrodantin [nitrofurantoin] Other (See Comments)   "blocked kidneys"    Tape Other (See Comments)   "Tears my skin and leaves marks." PLEASE USE COBAN!!   Sulfa Antibiotics Rash   Lisinopril Cough   Nutritional Supplements Other (See Comments)   Protein drink given to me "did something"   Penicillins Other (See Comments)   08/30/19- Patient can't remember, was 35+ years ago. OK with trying cephalexin in hospital      Medication List    STOP taking these medications   ranolazine 500 MG 12 hr tablet Commonly known as: RANEXA   terazosin 10 MG capsule Commonly known as: HYTRIN     TAKE these medications   acetaminophen 325 MG tablet Commonly known as: TYLENOL Take 650 mg by mouth every 6 (six) hours  as needed for moderate pain or headache.   alum & mag hydroxide-simeth 496-759-16 MG/5ML suspension Commonly known as: MAALOX PLUS Take 10 mLs by mouth every 4 (four) hours as needed for indigestion.   aspirin 81 MG chewable tablet Chew 1 tablet (81 mg  total) by mouth daily.   carboxymethylcellulose 0.5 % Soln Commonly known as: REFRESH PLUS Place 1 drop into both eyes every 12 (twelve) hours as needed (irritation).   cefdinir 300 MG capsule Commonly known as: OMNICEF Take 1 capsule (300 mg total) by mouth 2 (two) times daily for 7 days.   clonazePAM 0.5 MG tablet Commonly known as: KLONOPIN Take 1 tablet (0.5 mg total) by mouth 4 (four) times daily.   Coenzyme Q10 200 MG capsule Take 200 mg by mouth daily.   fenofibrate 160 MG tablet Take 1 tablet (160 mg total) by mouth daily.   finasteride 5 MG tablet Commonly known as: PROSCAR Take 1 tablet (5 mg total) by mouth daily.   insulin aspart 100 UNIT/ML injection Commonly known as: novoLOG Inject 1-9 Units into the skin 3 (three) times daily before meals. sliding scale of 0-120=0units 121-150=1unit 151-200=2units 201-250=3units 251-300=5units 301-350=7units 351-400=9units 401-999 call MD   isosorbide mononitrate 30 MG 24 hr tablet Commonly known as: IMDUR Take 1 tablet (30 mg total) by mouth daily.   losartan-hydrochlorothiazide 100-25 MG tablet Commonly known as: HYZAAR Take 1 tablet by mouth daily.   magnesium hydroxide 400 MG/5ML suspension Commonly known as: MILK OF MAGNESIA Take 30 mLs by mouth every 8 (eight) hours as needed for mild constipation.   magnesium oxide 400 (241.3 Mg) MG tablet Commonly known as: MAG-OX Take 1 tablet (400 mg total) by mouth daily.   metoprolol tartrate 50 MG tablet Commonly known as: LOPRESSOR Take 1 tablet (50 mg total) by mouth 2 (two) times daily. What changed: when to take this   nitroGLYCERIN 0.4 MG SL tablet Commonly known as: NITROSTAT Place 1 tablet (0.4 mg total) under the tongue every 5 (five) minutes as needed for chest pain (up to 3 doses). Do not give if blood pressure is low (less than 384 systolic). What changed:   when to take this  reasons to take this  additional instructions   nystatin  powder Commonly known as: MYCOSTATIN/NYSTOP Apply 1 application topically daily. groin   glucose blood test strip 1 each by Other route as needed.   ONE TOUCH ULTRA TEST test strip Generic drug: glucose blood 1 each by Other route daily.   OneTouch Delica Plus [id number] Misc 1 each by Other route daily.   polyethylene glycol 17 g packet Commonly known as: MIRALAX / GLYCOLAX Take 17 g by mouth daily as needed for mild constipation.   rosuvastatin 20 MG tablet Commonly known as: CRESTOR Take 1 tablet (20 mg total) by mouth daily.   sennosides-docusate sodium 8.6-50 MG tablet Commonly known as: SENOKOT-S Take 1 tablet by mouth daily.       Follow-up Information    Tisovec, Fransico Him, MD. Schedule an appointment as soon as possible for a visit in 1 week(s).   Specialty: Internal Medicine Contact information: [place] [address] [phone]        Nahser, Wonda Cheng, MD .   Specialty: Cardiology Contact information: [place] [address] [phone]        Deboraha Sprang, MD .   Specialty: Cardiology Contact information: [address] [place] [address] [phone]  Sherren Mocha, MD .   Specialty: Cardiology Contact information: [phone] [place] [address] [phone]        Alexis Frock, MD. Schedule an appointment as soon as possible for a visit in 2 week(s).   Specialty: Urology Contact information: [place] [address] [phone]        [facility], [facility] Follow up.   Specialty: Home Health Services Why: Will call to arrange a visit. Contact information: [place] [address] [phone]              Allergies  Allergen Reactions  . Macrodantin [Nitrofurantoin] Other (See Comments)    "blocked kidneys"   . Tape Other (See Comments)    "Tears my skin and leaves marks."  PLEASE USE COBAN!!  . Sulfa Antibiotics Rash  . Lisinopril Cough  . Nutritional Supplements Other (See Comments)    Protein drink given to me "did something"  . Penicillins Other (See Comments)    08/30/19- Patient can't remember, was 35+ years ago. OK with trying cephalexin in hospital    Consultations:  None   Procedures/Studies: DG Chest 2 View  Result Date: 10/20/2019 CLINICAL DATA:  Dizziness EXAM: CHEST - 2 VIEW COMPARISON:  October 01, 2019 FINDINGS: Trachea midline. Cardiomediastinal contours and hilar structures are normal. Lungs are clear.  Signs of trans arterial aortic valve replacement. On limited assessment skeletal structures without acute process. IMPRESSION: No active cardiopulmonary disease. Electronically Signed   By: Zetta Bills M.D.   On: 10/20/2019 11:00   DG Chest 2 View  Result Date: 10/01/2019 CLINICAL DATA:  Chest pain EXAM: CHEST - 2 VIEW COMPARISON:  September 19, 2019 FINDINGS: There is mild cardiomegaly. Aortic valve stent is noted. Aortic knob calcifications are seen. Both lungs are clear. No pleural effusion. No acute osseous abnormality is seen. IMPRESSION: No active cardiopulmonary disease. Electronically Signed   By: Prudencio Pair M.D.   On: 10/01/2019 00:49   DG Abd 2 Views  Result Date: 10/01/2019 CLINICAL DATA:  Abdominal pain EXAM: ABDOMEN - 2 VIEW COMPARISON:  CT, 08/24/2019. FINDINGS: Normal bowel gas pattern. Scattered aortoiliac atherosclerotic calcifications. No evidence of renal or ureteral stones. Soft tissues otherwise unremarkable. No acute skeletal abnormality. IMPRESSION: 1. No acute findings.  Normal bowel gas pattern. Electronically Signed   By: Lajean Manes M.D.   On: 10/01/2019 05:14   ECHOCARDIOGRAM COMPLETE  Result Date: 10/02/2019    ECHOCARDIOGRAM REPORT   Patient Name:   EZEKEIL BETHEL Date of Exam: 10/02/2019 Medical Rec #:  [id number]      Height:       70.0 in Accession #:    [id number]     Weight:       181.9 lb Date of Birth:   Apr 04, 1940      BSA:          2.005 m Patient Age:    10 years       BP:           131/57 mmHg Patient Gender: M              HR:           65 bpm. Exam Location:  Inpatient Procedure: 2D Echo, 3D Echo and Strain Analysis  MODIFIED REPORT:    This report was modified by Cherlynn Kaiser MD on 10/02/2019 due to report                                    revision.  Indications:     Chest Pain 786.50 / R07.9                  Syncope 780.2 / R55  History:         Patient has prior history of Echocardiogram examinations, most                  recent 09/12/2019. CHF, CAD and Previous Myocardial Infarction,                  Carotid Disease, Arrythmias:PVC, Signs/Symptoms:Chest Pain and                  Hypotension; Risk Factors:Sleep Apnea.                  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 Procedure Date:                  08/15/2019.  Sonographer:     Darlina Sicilian RDCS Referring Phys:  [id number] Darreld Mclean Diagnosing Phys: Cherlynn Kaiser MD IMPRESSIONS  1. Left ventricular ejection fraction, by estimation, is 60 to 65%. The left ventricle has normal function. The left ventricle has no regional wall motion abnormalities. There is moderate asymmetric left ventricular hypertrophy of the septal segment. Left ventricular diastolic parameters are consistent with Grade I diastolic dysfunction (impaired relaxation).  2. Right ventricular systolic function is normal. The right ventricular size is normal.  3. The mitral valve is normal in structure. Trivial mitral valve regurgitation. No evidence of mitral stenosis.  4. The aortic valve has been repaired/replaced. Aortic valve regurgitation No paravalvular or valvular regurgitation. 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 Procedure Date: 08/15/2019. Echo findings are consistent with normal structure and function of the  aortic valve prosthesis. Aortic valve mean gradient measures 13.0 mmHg.  5. The inferior vena cava is normal in size with greater than 50% respiratory variability, suggesting right atrial pressure of 3 mmHg. Comparison(s): Stable prosthetic aortic valve systolic gradient compared to 09/12/19. FINDINGS  Left Ventricle: Left ventricular ejection fraction, by estimation, is 60 to 65%. The left ventricle has normal function. The left ventricle has no regional wall motion abnormalities. The left ventricular internal cavity size was normal in size. There is  moderate asymmetric left ventricular hypertrophy of the septal segment. Left ventricular diastolic parameters are consistent with Grade I diastolic dysfunction (impaired relaxation). Right Ventricle: The right ventricular size is normal. No increase in right ventricular wall thickness. Right ventricular systolic function is normal. Left Atrium: Left atrial size was normal in size. Right Atrium: Right atrial size was normal in size. Pericardium: There is no evidence of pericardial effusion. Mitral Valve: The mitral valve is normal in structure. Normal mobility of the mitral valve leaflets. Trivial mitral valve regurgitation. No evidence of mitral valve stenosis. Tricuspid Valve: The tricuspid valve is normal in structure. Tricuspid valve regurgitation is trivial. No evidence of tricuspid stenosis. Aortic Valve: The aortic valve has been  repaired/replaced. Aortic valve regurgitation No paravalvular or valvular regurgitation. Aortic valve mean gradient measures 13.0 mmHg. Aortic valve peak gradient measures 23.3 mmHg. Aortic valve area, by VTI measures 1.79 cm. 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 Procedure Date: 08/15/2019. Echo findings are consistent with normal structure and function of the aortic valve prosthesis. Pulmonic Valve: The pulmonic valve was normal in structure. Pulmonic valve regurgitation is  trivial. No evidence of pulmonic stenosis. Aorta: The aortic root is normal in size and structure. Venous: The inferior vena cava is normal in size with greater than 50% respiratory variability, suggesting right atrial pressure of 3 mmHg. IAS/Shunts: No atrial level shunt detected by color flow Doppler.  LEFT VENTRICLE PLAX 2D LVIDd:         5.10 cm      Diastology LVIDs:         3.20 cm      LV e' lateral:   7.13 cm/s LV PW:         0.90 cm      LV E/e' lateral: 10.9 LV IVS:        1.40 cm      LV e' medial:    4.80 cm/s LVOT diam:     2.50 cm      LV E/e' medial:  16.2 LV SV:         87 LV SV Index:   43 LVOT Area:     4.91 cm  LV Volumes (MOD) LV vol d, MOD A2C: 134.0 ml LV vol d, MOD A4C: 114.0 ml LV vol s, MOD A2C: 54.1 ml LV vol s, MOD A4C: 59.2 ml LV SV MOD A2C:     79.9 ml LV SV MOD A4C:     114.0 ml LV SV MOD BP:      69.3 ml RIGHT VENTRICLE RV S prime:     16.80 cm/s TAPSE (M-mode): 2.5 cm LEFT ATRIUM             Index       RIGHT ATRIUM           Index LA diam:        3.80 cm 1.90 cm/m  RA Area:     12.20 cm LA Vol (A2C):   43.8 ml 21.85 ml/m RA Volume:   23.40 ml  11.67 ml/m LA Vol (A4C):   41.1 ml 20.50 ml/m LA Biplane Vol: 45.1 ml 22.50 ml/m  AORTIC VALVE AV Area (Vmax):    1.75 cm AV Area (Vmean):   1.74 cm AV Area (VTI):     1.79 cm AV Vmax:           241.50 cm/s AV Vmean:          167.750 cm/s AV VTI:            0.484 m AV Peak Grad:      23.3 mmHg AV Mean Grad:      13.0 mmHg LVOT Vmax:         86.20 cm/s LVOT Vmean:        59.300 cm/s LVOT VTI:          0.177 m LVOT/AV VTI ratio: 0.37  AORTA Ao Asc diam: 3.40 cm MITRAL VALVE MV Area (PHT): 2.29 cm    SHUNTS MV Decel Time: 331 msec    Systemic VTI:  0.18 m MV E velocity: 77.60 cm/s  Systemic Diam: 2.50 cm MV A velocity:  90.80 cm/s MV E/A ratio:  0.85 Cherlynn Kaiser MD Electronically signed by Cherlynn Kaiser MD Signature Date/Time: 10/02/2019/7:46:50 PM    Final (Updated)      Subjective: Patient seen and examined bedside, resting  comfortably.  No complaints this morning.  Denies headache, no fever/chills/night sweats, no nausea cefonicid/diarrhea, no chest pain, palpitations, no shortness of breath, no abdominal pain.  No acute events overnight per nursing staff.  Discharge Exam: Vitals:   10/23/19 0542 10/23/19 1212  BP: (!) 161/68 137/67  Pulse: 65 70  Resp: 18 16  Temp: 98.2 F (36.8 C) 98 F (36.7 C)  SpO2: 97% 92%   Vitals:   10/22/19 1811 10/22/19 2155 10/23/19 0542 10/23/19 1212  BP: (!) 155/70 (!) 156/56 (!) 161/68 137/67  Pulse: 70 66 65 70  Resp: 18  18 16   Temp: 98.7 F (37.1 C) 98.3 F (36.8 C) 98.2 F (36.8 C) 98 F (36.7 C)  TempSrc: Oral Oral Oral Oral  SpO2:  93% 97% 92%  Weight:      Height:        General: Pt is alert, awake, not in acute distress Cardiovascular: RRR, S1/S2 +, no rubs, no gallops Respiratory: CTA bilaterally, no wheezing, no rhonchi, oxygenating well on room air Abdominal: Soft, NT, ND, bowel sounds +  GU: Foley catheter noted, bag with clear yellow urine Extremities: no edema, no cyanosis    The results of significant diagnostics from this hospitalization (including imaging, microbiology, ancillary and laboratory) are listed below for reference.     Microbiology: Recent Results (from the past 240 hour(s))  Urine culture     Status: Abnormal   Collection Time: 10/20/19 11:13 AM   Specimen: Urine, Random  Result Value Ref Range Status   Specimen Description URINE, RANDOM  Final   Special Requests   Final    NONE Performed at [facility], [address] [place], [place] [address]    Culture >=100,000 COLONIES/mL KLEBSIELLA PNEUMONIAE (A)  Final   Report Status 10/22/2019 FINAL  Final   Organism ID, Bacteria KLEBSIELLA PNEUMONIAE (A)  Final      Susceptibility   Klebsiella pneumoniae - MIC*    AMPICILLIN RESISTANT Resistant     CEFAZOLIN <=4 SENSITIVE Sensitive     CEFTRIAXONE <=0.25 SENSITIVE Sensitive     CIPROFLOXACIN <=0.25 SENSITIVE  Sensitive     GENTAMICIN <=1 SENSITIVE Sensitive     IMIPENEM <=0.25 SENSITIVE Sensitive     NITROFURANTOIN 32 SENSITIVE Sensitive     TRIMETH/SULFA <=20 SENSITIVE Sensitive     AMPICILLIN/SULBACTAM 4 SENSITIVE Sensitive     PIP/TAZO <=4 SENSITIVE Sensitive     * >=100,000 COLONIES/mL KLEBSIELLA PNEUMONIAE  Culture, blood (routine x 2)     Status: None (Preliminary result)   Collection Time: 10/20/19 11:45 AM   Specimen: BLOOD  Result Value Ref Range Status   Specimen Description BLOOD LEFT ANTECUBITAL  Final   Special Requests   Final    BOTTLES DRAWN AEROBIC AND ANAEROBIC Blood Culture adequate volume   Culture   Final    NO GROWTH 3 DAYS Performed at [facility], [address] [place], [place] [address]    Report Status PENDING  Incomplete  Culture, blood (routine x 2)     Status: None (Preliminary result)   Collection Time: 10/20/19  3:49 PM   Specimen: BLOOD RIGHT HAND  Result Value Ref Range Status   Specimen Description BLOOD RIGHT HAND  Final   Special  Requests   Final    BOTTLES DRAWN AEROBIC ONLY Blood Culture results may not be optimal due to an inadequate volume of blood received in culture bottles   Culture   Final    NO GROWTH 3 DAYS Performed at [facility], [place] [address], [place], [place] [address]    Report Status PENDING  Incomplete     Labs: BNP (last 3 results) Recent Labs    08/11/19 0844 09/08/19 2139 09/09/19 0308  BNP 238.5* 222.2* 010.2*   Basic Metabolic Panel: Recent Labs  Lab 10/20/19 1113 10/20/19 1549 10/21/19 0327 10/21/19 1433 10/22/19 0751  NA 134*  --  137  --  137  K 3.8  --  3.4*  --  3.7  CL 100  --  105  --  101  CO2 23  --  23  --  27  GLUCOSE 217*  --  192*  --  227*  BUN 14  --  10  --  6*  CREATININE 1.39* 1.22 1.02  --  0.96  CALCIUM 9.6  --  9.0  --  9.3  MG  --  1.6*  --  1.5*  --   PHOS  --  3.8  --   --   --    Liver Function Tests: Recent Labs  Lab 10/21/19 0327  AST 16  ALT 15   ALKPHOS 32*  BILITOT 0.5  PROT 5.4*  ALBUMIN 3.0*   No results for input(s): LIPASE, AMYLASE in the last 168 hours. No results for input(s): AMMONIA in the last 168 hours. CBC: Recent Labs  Lab 10/20/19 1113 10/20/19 1549 10/21/19 0327  WBC 9.4 7.4 6.0  HGB 13.0 12.7* 12.1*  HCT 38.3* 38.5* 35.7*  MCV 82.5 82.4 82.8  PLT 152 135* 136*   Cardiac Enzymes: No results for input(s): CKTOTAL, CKMB, CKMBINDEX, TROPONINI in the last 168 hours. BNP: Invalid input(s): POCBNP CBG: Recent Labs  Lab 10/22/19 1130 10/22/19 1612 10/22/19 2111 10/23/19 0519 10/23/19 1107  GLUCAP 187* 241* 176* 148* 210*   D-Dimer No results for input(s): DDIMER in the last 72 hours. Hgb A1c No results for input(s): HGBA1C in the last 72 hours. Lipid Profile No results for input(s): CHOL, HDL, LDLCALC, TRIG, CHOLHDL, LDLDIRECT in the last 72 hours. Thyroid function studies No results for input(s): TSH, T4TOTAL, T3FREE, THYROIDAB in the last 72 hours.  Invalid input(s): FREET3 Anemia work up No results for input(s): VITAMINB12, FOLATE, FERRITIN, TIBC, IRON, RETICCTPCT in the last 72 hours. Urinalysis    Component Value Date/Time   COLORURINE AMBER (A) 10/20/2019 1113   APPEARANCEUR CLOUDY (A) 10/20/2019 1113   LABSPEC 1.010 10/20/2019 1113   PHURINE 6.0 10/20/2019 1113   GLUCOSEU NEGATIVE 10/20/2019 1113   HGBUR MODERATE (A) 10/20/2019 1113   BILIRUBINUR NEGATIVE 10/20/2019 1113   KETONESUR NEGATIVE 10/20/2019 1113   PROTEINUR 30 (A) 10/20/2019 1113   NITRITE POSITIVE (A) 10/20/2019 1113   LEUKOCYTESUR LARGE (A) 10/20/2019 1113   Sepsis Labs Invalid input(s): PROCALCITONIN,  WBC,  LACTICIDVEN Microbiology Recent Results (from the past 240 hour(s))  Urine culture     Status: Abnormal   Collection Time: 10/20/19 11:13 AM   Specimen: Urine, Random  Result Value Ref Range Status   Specimen Description URINE, RANDOM  Final   Special Requests   Final    NONE Performed at [facility], [place] [address], [place], [place] [address]    Culture >=100,000 COLONIES/mL KLEBSIELLA PNEUMONIAE (A)  Final   Report Status 10/22/2019 FINAL  Final   Organism ID, Bacteria KLEBSIELLA PNEUMONIAE (A)  Final      Susceptibility   Klebsiella pneumoniae - MIC*    AMPICILLIN RESISTANT Resistant     CEFAZOLIN <=4 SENSITIVE Sensitive     CEFTRIAXONE <=0.25 SENSITIVE Sensitive     CIPROFLOXACIN <=0.25 SENSITIVE Sensitive     GENTAMICIN <=1 SENSITIVE Sensitive     IMIPENEM <=0.25 SENSITIVE Sensitive     NITROFURANTOIN 32 SENSITIVE Sensitive     TRIMETH/SULFA <=20 SENSITIVE Sensitive     AMPICILLIN/SULBACTAM 4 SENSITIVE Sensitive     PIP/TAZO <=4 SENSITIVE Sensitive     * >=100,000 COLONIES/mL KLEBSIELLA PNEUMONIAE  Culture, blood (routine x 2)     Status: None (Preliminary result)   Collection Time: 10/20/19 11:45 AM   Specimen: BLOOD  Result Value Ref Range Status   Specimen Description BLOOD LEFT ANTECUBITAL  Final   Special Requests   Final    BOTTLES DRAWN AEROBIC AND ANAEROBIC Blood Culture adequate volume   Culture   Final    NO GROWTH 3 DAYS Performed at [facility], [address] [place], [place] [address]    Report Status PENDING  Incomplete  Culture, blood (routine x 2)     Status: None (Preliminary result)   Collection Time: 10/20/19  3:49 PM   Specimen: BLOOD RIGHT HAND  Result Value Ref Range Status   Specimen Description BLOOD RIGHT HAND  Final   Special Requests   Final    BOTTLES DRAWN AEROBIC ONLY Blood Culture results may not be optimal due to an inadequate volume of blood received in culture bottles   Culture   Final    NO GROWTH 3 DAYS Performed at [facility], [place] [address], [place], [place] [address]    Report Status PENDING  Incomplete     Time coordinating discharge: Over 30 minutes  SIGNED:   Nahia Nissan J [place] DO  [facility] 10/23/2019, 3:51 PM

## 2019-10-23 NOTE — Discharge Instructions (Signed)
Urinary Tract Infection, Adult A urinary tract infection (UTI) is an infection of any part of the urinary tract. The urinary tract includes:  The kidneys.  The ureters.  The bladder.  The urethra. These organs make, store, and get rid of pee (urine) in the body. What are the causes? This is caused by germs (bacteria) in your genital area. These germs grow and cause swelling (inflammation) of your urinary tract. What increases the risk? You are more likely to develop this condition if:  You have a small, thin tube (catheter) to drain pee.  You cannot control when you pee or poop (incontinence).  You are male, and: ? You use these methods to prevent pregnancy:  A medicine that kills sperm (spermicide).  A device that blocks sperm (diaphragm). ? You have low levels of a male hormone (estrogen). ? You are pregnant.  You have genes that add to your risk.  You are sexually active.  You take antibiotic medicines.  You have trouble peeing because of: ? A prostate that is bigger than normal, if you are male. ? A blockage in the part of your body that drains pee from the bladder (urethra). ? A kidney stone. ? A nerve condition that affects your bladder (neurogenic bladder). ? Not getting enough to drink. ? Not peeing often enough.  You have other conditions, such as: ? Diabetes. ? A weak disease-fighting system (immune system). ? Sickle cell disease. ? Gout. ? Injury of the spine. What are the signs or symptoms? Symptoms of this condition include:  Needing to pee right away (urgently).  Peeing often.  Peeing small amounts often.  Pain or burning when peeing.  Blood in the pee.  Pee that smells bad or not like normal.  Trouble peeing.  Pee that is cloudy.  Fluid coming from the vagina, if you are male.  Pain in the belly or lower back. Other symptoms include:  Throwing up (vomiting).  No urge to eat.  Feeling mixed up (confused).  Being tired  and grouchy (irritable).  A fever.  Watery poop (diarrhea). How is this treated? This condition may be treated with:  Antibiotic medicine.  Other medicines.  Drinking enough water. Follow these instructions at home:  Medicines  Take over-the-counter and prescription medicines only as told by your doctor.  If you were prescribed an antibiotic medicine, take it as told by your doctor. Do not stop taking it even if you start to feel better. General instructions  Make sure you: ? Pee until your bladder is empty. ? Do not hold pee for a long time. ? Empty your bladder after sex. ? Wipe from front to back after pooping if you are a male. Use each tissue one time when you wipe.  Drink enough fluid to keep your pee pale yellow.  Keep all follow-up visits as told by your doctor. This is important. Contact a doctor if:  You do not get better after 1-2 days.  Your symptoms go away and then come back. Get help right away if:  You have very bad back pain.  You have very bad pain in your lower belly.  You have a fever.  You are sick to your stomach (nauseous).  You are throwing up. Summary  A urinary tract infection (UTI) is an infection of any part of the urinary tract.  This condition is caused by germs in your genital area.  There are many risk factors for a UTI. These include having a small, thin   tube to drain pee and not being able to control when you pee or poop.  Treatment includes antibiotic medicines for germs.  Drink enough fluid to keep your pee pale yellow. This information is not intended to replace advice given to you by your health care provider. Make sure you discuss any questions you have with your health care provider. Document Revised: 02/03/2018 Document Reviewed: 08/26/2017 [employer] Patient Education  2020 [employer]  

## 2019-10-25 LAB — CULTURE, BLOOD (ROUTINE X 2)
Culture: NO GROWTH
Culture: NO GROWTH
Special Requests: ADEQUATE

## 2019-10-26 ENCOUNTER — Encounter: Payer: Self-pay | Admitting: Physician Assistant

## 2019-10-26 ENCOUNTER — Other Ambulatory Visit: Payer: Self-pay

## 2019-10-26 ENCOUNTER — Ambulatory Visit (INDEPENDENT_AMBULATORY_CARE_PROVIDER_SITE_OTHER): Payer: Medicare Other | Admitting: Physician Assistant

## 2019-10-26 VITALS — BP 118/64 | HR 72 | Ht 70.0 in | Wt 184.4 lb

## 2019-10-26 DIAGNOSIS — R911 Solitary pulmonary nodule: Secondary | ICD-10-CM | POA: Diagnosis not present

## 2019-10-26 DIAGNOSIS — Z952 Presence of prosthetic heart valve: Secondary | ICD-10-CM | POA: Diagnosis not present

## 2019-10-26 DIAGNOSIS — R55 Syncope and collapse: Secondary | ICD-10-CM | POA: Diagnosis not present

## 2019-10-26 DIAGNOSIS — R339 Retention of urine, unspecified: Secondary | ICD-10-CM

## 2019-10-26 DIAGNOSIS — I10 Essential (primary) hypertension: Secondary | ICD-10-CM

## 2019-10-26 DIAGNOSIS — C649 Malignant neoplasm of unspecified kidney, except renal pelvis: Secondary | ICD-10-CM

## 2019-10-26 MED ORDER — AZITHROMYCIN 500 MG PO TABS: ORAL_TABLET | ORAL | 11 refills | Status: DC

## 2019-10-26 NOTE — Progress Notes (Signed)
[name]                                       Cardiology Office Note    Date:  10/26/2019   ID:  Alexis Griffith, DOB 1940/05/19, MRN [id number]  PCP:  Haywood Pao, MD  Cardiologist: Dr. Acie Fredrickson / Dr. Burt Knack & Dr. Cyndia Bent (TAVR)  CC: 1 month s/p TAVR  History of Present Illness:  Alexis Griffith is a 79 y.o. male with a history of HTN, HLD, DMT2, OSA, history of CVA, multivessel CAD, recently diagnosed renal cell carcinoma and severe AS s/p TAVR (2/35/57) with a complicated hospital course and multiple subsequent readmissions who presents to clinic for follow up.   He had a 2D echocardiogram on 04/25/2018 which showed a mean gradient across aortic valve of 29 mmHg with a peak gradient of 54 mmHg. Valve area was 0.79cm. Over the past few months he has been complaining of occasional vague chest discomfort that is not necessarily exertional related. He underwent a gated coronary CT which showed a calcium score of 2633. There was severe multivessel calcified plaque. The FFR was 0.90 in the proximal LAD, 0.78 in the mid LAD, and 0.68 in the distal LAD. The FFR was 0.87 in the proximal left circumflex, 0.71 in the mid left circumflex, and 0.57 in the distal left circumflex. The right coronary was occluded proximally. He subsequently underwent cardiac catheterization on 07/07/2019. This showed 70% stenosis in the proximal to mid LAD. The distal LAD had 75% stenosis. The left circumflex had 75% proximal and mid stenosis. The right coronary was occluded at its origin with filling of the dominant distal vessel by collaterals from the left. Left ventricular ejection fraction was 55 to 65%. The mean gradient measured across the aortic valve was 37 mmHg. He underwent a repeat echocardiogram5/17/21which showed a mean gradient across aortic valve of 43 mmHg with a peak gradient of 62 mmHg. Aortic valve area was 0.68 cm. There was mild  aortic insufficiency. Left ventricular ejection fraction was 55 to 60%. He subsequently underwent CT scan work-up for consideration of TAVR which incidentally showed a 7.2 x 6.1 x 8.3 cm heterogeneous enhancing left renal mass suspicious for primary renal cell carcinoma. There are also several small pulmonary nodules throughout the lungs bilaterally with the largest in the right lower lobe measuring 1.3 x 0.9 cm.Follow up PET scan showeda hypermetabolic exophytic 7 cm renal cortical mass with a maximum SUV of 8.1 that is most likely a renal cell carcinomaas well asanal hypermetabolism which is most likely due to muscle activity. The 1.2 cm dominant right lower lobe pulmonary nodule was not hypermetabolic. There were a few additional small solid pulmonary nodules scattered in both lungs measuring up to 0.5 cm in the left upper lobe are all below PET resolution. These will require follow up in 3 months.  He was seen by the multidisciplinary valve team and underwent successful TAVR with a 26 mm Edwards Sapien 3 THV via the TF approach on 08/15/19. Post operative echo showed EF 55%, normally functioning TAVR with a mean gradient of 11.7 mm hg and no PVL. He had a prolonged hospital course due to issues withacute urinary retention, hematuria, AKI, UTI/epididymoorchitis requiring antibiotic,MRI with 64mm infarct felt to be incidental/procedural related,orthostatic hypotension with recurrent syncoperequiring midodrine, Mestinon, and short-term Florinef. [facility].  He was then readmitted 5 more times for NSTEMI, chest pain and syncope, UTI. He has been seen back by urology who felt like it was best to wait on surgery to let his heart recover. Echo was completed during one of these admissions on 10/02/19 and showed EF 60%, normally functioning TAVR with a mean gradient of 13 mmHg and no PVL.  Today he presents to clinic for follow up. He is here with his wife. He is doing  reasonably well. No more chest pain. No more dizziness or syncope. No blood in his stool or urine. Foley still in place. Foley still in place which is his biggest complaint. Breathing has been good. He does have some fatigue.  Past Medical History:  Diagnosis Date  . Anxiety   . 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 CAD (coronary artery disease)   . Carotid artery disease ([place])    CTA 08/2019 of the head and neck show moderate 65 to 75% bilateral ICA stenoses   . Chronic diastolic CHF (congestive heart failure) ([place])   . Diabetes mellitus type 2 in nonobese (HCC)   . Excessive urination at night 06/05/2013  . Giant cell arteritis ([place]) 05/17/2012  . Hyperlipidemia   . Hypertension   . Hypertensive urgency   . Hypomagnesemia 09/11/2019  . Mild anemia 09/11/2019  . NSTEMI (non-ST elevated myocardial infarction) ([place]) 09/09/2019  . Orthostatic hypotension 09/11/2019  . Premature atrial contractions 09/12/2019  . Pulmonary nodules   . PVC (premature ventricular contraction)   . Renal mass   . 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 Burt Knack & Dr. Cyndia Bent  . Severe aortic stenosis   . Sinus bradycardia on ECG   . Sleep apnea   . 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 Wilburn Cornelia.  . Stroke ([place])   . TIA (transient ischemic attack)   . Urinary retention 09/11/2019    Past Surgical History:  Procedure Laterality Date  . CATARACT EXTRACTION, BILATERAL  10/2015, and 11/2015   with adding  TORIC lenses bilaterally   . INTRAVASCULAR PRESSURE WIRE/FFR STUDY N/A 07/07/2019   Procedure: INTRAVASCULAR PRESSURE WIRE/FFR STUDY;  Surgeon: [name], MD;  Location: [facility];  Service: Cardiovascular;  Laterality: N/A;  . LEFT HEART CATH AND CORONARY ANGIOGRAPHY N/A 07/07/2019   Procedure: LEFT HEART CATH AND CORONARY ANGIOGRAPHY;  Surgeon: [name], MD;  Location: [facility];  Service: Cardiovascular;  Laterality: N/A;  . SQUAMOUS CELL CARCINOMA EXCISION     tongue  . TEE WITHOUT CARDIOVERSION N/A 08/15/2019   Procedure: TRANSESOPHAGEAL ECHOCARDIOGRAM (TEE);  Surgeon: Sherren Mocha, MD;  [facility];  Service: Open Heart Surgery;  Laterality: N/A;  . TRANSCATHETER AORTIC VALVE REPLACEMENT, TRANSFEMORAL N/A 08/15/2019   Procedure: TRANSCATHETER AORTIC VALVE REPLACEMENT, TRANSFEMORAL using Edwards [employer] SAPIEN 3 Ultra 26 mm THV.;  Surgeon: Sherren Mocha, MD;  Location: [facility];  Service: Open Heart Surgery;  Laterality: N/A;    Current Medications: Outpatient Medications Prior to Visit  Medication Sig Dispense Refill  . acetaminophen (TYLENOL) 325 MG tablet Take 650 mg by mouth every 6 (six) hours as needed for moderate pain or headache.    Marland Kitchen alum & mag hydroxide-simeth (MAALOX PLUS) 400-400-40 MG/5ML suspension Take 10 mLs by mouth every 4 (four) hours as needed for indigestion.    Marland Kitchen aspirin 81 MG  chewable tablet Chew 1 tablet (81 mg total) by mouth daily. 360 tablet 0  . carboxymethylcellulose (REFRESH PLUS) 0.5 % SOLN Place 1 drop into both eyes every 12 (twelve) hours as needed (irritation).    . cefdinir (OMNICEF) 300 MG capsule Take 1 capsule (300 mg total) by mouth 2 (two) times daily for 7 days. 14 capsule 0  . clonazePAM (KLONOPIN) 0.5 MG tablet Take 1 tablet (0.5 mg total) by mouth 4 (four) times daily. 12 tablet 0  . Coenzyme Q10 200 MG capsule Take 200 mg by mouth daily.    . fenofibrate 160 MG tablet Take 1 tablet (160 mg total) by mouth daily. 90 tablet 0  . finasteride (PROSCAR) 5 MG tablet Take 1 tablet (5 mg total) by mouth daily. 90 tablet 0  . glucose blood test strip 1 each by Other route as needed.     . isosorbide mononitrate (IMDUR) 30 MG 24 hr tablet Take 1 tablet (30 mg total) by mouth daily. 30 tablet 0  . Lancets (ONETOUCH DELICA PLUS [id number]) MISC 1 each by Other route daily.     Marland Kitchen losartan-hydrochlorothiazide (HYZAAR)  100-25 MG tablet Take 1 tablet by mouth daily. 90 tablet 0  . magnesium hydroxide (MILK OF MAGNESIA) 400 MG/5ML suspension Take 30 mLs by mouth every 8 (eight) hours as needed for mild constipation.    . magnesium oxide (MAG-OX) 400 (241.3 Mg) MG tablet Take 1 tablet (400 mg total) by mouth daily. 30 tablet 3  . metoprolol tartrate (LOPRESSOR) 50 MG tablet Take 1 tablet (50 mg total) by mouth 2 (two) times daily. 180 tablet 0  . nitroGLYCERIN (NITROSTAT) 0.4 MG SL tablet Place 1 tablet (0.4 mg total) under the tongue every 5 (five) minutes as needed for chest pain (up to 3 doses). Do not give if blood pressure is low (less than 213 systolic). 12 tablet 0  . nystatin (MYCOSTATIN/NYSTOP) powder Apply 1 application topically daily. groin    . ONE TOUCH ULTRA TEST test strip 1 each by Other route daily.     . polyethylene glycol (MIRALAX / GLYCOLAX) 17 g packet Take 17 g by mouth daily as needed for mild constipation. 14 each 0  . rosuvastatin (CRESTOR) 20 MG tablet Take 1 tablet (20 mg total) by mouth daily. 90 tablet 0  . sitaGLIPtin (JANUVIA) 100 MG tablet Take 100 mg by mouth daily.    . insulin aspart (NOVOLOG) 100 UNIT/ML injection Inject 1-9 Units into the skin 3 (three) times daily before meals. sliding scale of 0-120=0units 121-150=1unit 151-200=2units 201-250=3units 251-300=5units 301-350=7units 351-400=9units 401-999 call MD (Patient not taking: Reported on 10/26/2019)    . sennosides-docusate sodium (SENOKOT-S) 8.6-50 MG tablet Take 1 tablet by mouth daily. (Patient not taking: Reported on 10/26/2019)     No facility-administered medications prior to visit.     Allergies:   Macrodantin [nitrofurantoin], Tape, Sulfa antibiotics, Lisinopril, Nutritional supplements, and Penicillins   Social History   Socioeconomic History  . Marital status: Married    Spouse name: Not on file  . Number of children: 2  . Years of education: Not on file  . Highest education level: Not on file    Occupational History  . Not on file  Tobacco Use  . Smoking status: Former Smoker    Quit date: 06/25/1975    Years since quitting: 44.3  . Smokeless tobacco: Never Used  Vaping Use  . Vaping Use: Never used  Substance and Sexual Activity  . Alcohol use: No  .  Drug use: No  . Sexual activity: Yes  Other Topics Concern  . Not on file  Social History Narrative   Patient lives at home with his wife.    Social Determinants of Health   Financial Resource Strain:   . Difficulty of Paying Living Expenses: Not on file  Food Insecurity:   . Worried About Charity fundraiser in the Last Year: Not on file  . Ran Out of Food in the Last Year: Not on file  Transportation Needs:   . Lack of Transportation (Medical): Not on file  . Lack of Transportation (Non-Medical): Not on file  Physical Activity:   . Days of Exercise per Week: Not on file  . Minutes of Exercise per Session: Not on file  Stress:   . Feeling of Stress : Not on file  Social Connections:   . Frequency of Communication with Friends and Family: Not on file  . Frequency of Social Gatherings with Friends and Family: Not on file  . Attends Religious Services: Not on file  . Active Member of Clubs or Organizations: Not on file  . Attends Archivist Meetings: Not on file  . Marital Status: Not on file     Family History:  The patient's family history includes Heart attack in his father and mother; Heart disease in his father and mother.     ROS:   Please see the history of present illness.    ROS All other systems reviewed and are negative.   PHYSICAL EXAM:   VS:  BP 118/64   Pulse 72   Ht 5\' 10"  (1.778 m)   Wt 184 lb 6.4 oz (83.6 kg)   SpO2 98%   BMI 26.46 kg/m    GEN: Well nourished, well developed, in no acute distress. Foley in place HEENT: normal Neck: no JVD or masses Cardiac: RRR; no murmurs, rubs, or gallops,no edema  Respiratory:  clear to auscultation bilaterally, normal work of  breathing GI: soft, nontender, nondistended, + BS MS: no deformity or atrophy Skin: warm and dry, no rash Neuro:  Alert and Oriented x 3, Strength and sensation are intact Psych: euthymic mood, full affect   Wt Readings from Last 3 Encounters:  10/26/19 184 lb 6.4 oz (83.6 kg)  10/22/19 174 lb 6.1 oz (79.1 kg)  10/10/19 178 lb 3.2 oz (80.8 kg)      Studies/Labs Reviewed:   EKG:  EKG is NOT ordered today.   Recent Labs: 09/09/2019: B Natriuretic Peptide 326.2 10/01/2019: TSH 1.472 10/21/2019: ALT 15; Hemoglobin 12.1; Magnesium 1.5; Platelets 136 10/22/2019: BUN 6; Creatinine, Ser 0.96; Potassium 3.7; Sodium 137   Lipid Panel    Component Value Date/Time   CHOL 130 09/09/2019 0308   CHOL 139 06/14/2019 0959   TRIG 68 09/09/2019 0308   HDL 40 (L) 09/09/2019 0308   HDL 35 (L) 06/14/2019 0959   CHOLHDL 3.3 09/09/2019 0308   VLDL 14 09/09/2019 0308   LDLCALC 76 09/09/2019 0308   LDLCALC 80 06/14/2019 0959    Additional studies/ records that were reviewed today include:  TAVR OPERATIVE NOTE   Date of Procedure:08/15/2019  Preoperative Diagnosis:Severe Aortic Stenosis   Postoperative Diagnosis:Same   Procedure:   Transcatheter Aortic Valve Replacement - PercutaneousRightTransfemoral Approach Edwards Sapien 3 Ultra THV (size 51mm, model # 9750TFX, serial # [id number])  Co-Surgeons:Bryan Alveria Apley, MD and Sherren Mocha, MD   Anesthesiologist:Carswell Glennon Mac, MD  Echocardiographer:M. Croitoru, MD  Pre-operative Echo Findings: ? Severe aortic stenosis ?  Normalleft ventricular systolic function  Post-operative Echo Findings: ? NOparavalvular leak ? Normalleft ventricular systolic function   _________________    Echo 08/16/19:  IMPRESSIONS  1. Left ventricular ejection fraction, by estimation, is 55 to 60%. The  left ventricle has  normal function. The left ventricle demonstrates  regional wall motion abnormalities (see scoring diagram/findings for  description). There is mild concentric left ventricular hypertrophy. Indeterminate diastolic filling due to E-A fusion.  2. Right ventricular systolic function is normal. The right ventricular  size is normal.  3. The mitral valve is normal in structure. No evidence of mitral valve  regurgitation. No evidence of mitral stenosis.  4. The aortic valve is normal in structure. Aortic valve regurgitation is  not visualized. No aortic stenosis is present. Aortic valve mean gradient  measures 11.7 mmHg. Aortic valve Vmax measures 2.48 m/s.  5. The inferior vena cava is normal in size with greater than 50%  respiratory variability, suggesting right atrial pressure of 3 mmHg.   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 No significant chronic ischemic change.  _______________  Ct angio head/neck 08/16/19 IMPRESSION: 1. Progressive cervical carotid atherosclerosis with 75% right and 65-70% left proximal ICA stenoses. 2. 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 3. New moderate right and mild left M1 stenoses. 4. Unchanged mild left intracranial ICA stenosis. 5. Aortic Atherosclerosis (ICD10-I70.0).  __________________  Echo 10/02/19 IMPRESSIONS  1. Left ventricular ejection fraction, by estimation, is 60 to 65%. The  left ventricle has normal function. The left ventricle has no regional  wall motion abnormalities. There is moderate asymmetric left ventricular  hypertrophy of the septal segment.  Left ventricular diastolic parameters are consistent with Grade I  diastolic dysfunction (impaired relaxation).  2. Right ventricular systolic function is normal. The right ventricular  size is normal.  3. The mitral valve is normal in structure. Trivial  mitral valve  regurgitation. No evidence of mitral stenosis.  4. The aortic valve has been repaired/replaced. Aortic valve  regurgitation No paravalvular or valvular regurgitation. There is a 26 mm  Edwards Sapien prosthetic (TAVR) valve present in the aortic position.  Procedure Date: 08/15/2019. Echo findings are  consistent with normal structure and function of the aortic valve  prosthesis. Aortic valve mean gradient measures 13.0 mmHg.  5. The inferior vena cava is normal in size with greater than 50%  respiratory variability, suggesting right atrial pressure of 3 mmHg.   Comparison(s): Stable prosthetic aortic valve systolic gradient compared  to 09/12/19.   ASSESSMENT & PLAN:   Severe AS s/p TAVR:10/02/19 and showed EF 60%, normally functioning TAVR with a mean gradient of 13 mmHg and no PVL. He has NYHA class II symptoms. SBE prophylaxis discussed; I have RX'd azithromycin due to a PCN allergy. He will continued on aspirin alone. I will see him back in 1 year with an echo .  Orthostatic hypotension with recurrent syncope: this has improved. Now back on antihypertensives.  HTN:BP well controlled today. No changes made  Urinary retention with hematuria:still has foley in place. No more hematuria  RCC: followed by Dr. Tresa Moore. Plan for continue follow up now with eventual resection    Pulmonary nodules: PET scan showed a dominant right lower lobe 1.2 cm solid pulmonary nodule is non-hypermetabolic. Additional small solid subcentimeter pulmonary nodules in both lungs are below PET resolution and warrant attention on follow-up chest CT in 3 months. This is  due now and I will get this set up today.      Medication Adjustments/Labs and Tests Ordered: Current medicines are reviewed at length with the patient today.  Concerns regarding medicines are outlined above.  Medication changes, Labs and Tests ordered today are listed in the Patient Instructions below. Patient Instructions   Medication Instructions:  Your provider discussed the importance of taking an antibiotic prior to all dental visits to prevent damage to the heart valves from infection. You were given a prescription for AZITHROMYCIN 500mg  to take one hour prior to any dental appointment.  *If you need a refill on your cardiac medications before your next appointment, please call your pharmacy*  Testing/Procedures: Joellen Jersey recommends you have a CHEST CT.  Follow-Up: Your provider recommends that you schedule a follow-up appointment in 3 MONTHS with Dr. Acie Fredrickson.    Mable Fill, PA-C  10/26/2019 10:00 PM    [address], [place], [place]  [address] Phone: [phone]; Fax: [phone]

## 2019-10-26 NOTE — Patient Instructions (Addendum)
Medication Instructions:  Your provider discussed the importance of taking an antibiotic prior to all dental visits to prevent damage to the heart valves from infection. You were given a prescription for AZITHROMYCIN 500mg  to take one hour prior to any dental appointment.  *If you need a refill on your cardiac medications before your next appointment, please call your pharmacy*  Testing/Procedures: Joellen Jersey recommends you have a CHEST CT.  Follow-Up: Your provider recommends that you schedule a follow-up appointment in 3 MONTHS with Dr. Acie Fredrickson.

## 2019-11-03 ENCOUNTER — Telehealth: Payer: Self-pay | Admitting: Cardiovascular Disease

## 2019-11-03 NOTE — Telephone Encounter (Signed)
Pt c/o medication issue:  1. Name of Medication: clonazePAM (KLONOPIN) 0.5 MG tablet  2. How are you currently taking this medication (dosage and times per day)? 1 tablet (0.5 mg total) by mouth 4 (four) times daily  3. Are you having a reaction (difficulty breathing--STAT)? No  4. What is your medication issue? Patient states after being discharged from the hospital, he requested to have medication refilled. However, he needed an appointment with his PCP prior to having medication refilled. He states he scheduled an appointment and when he went to see his PCP, he was advised to only take 1 tablet (0.5 mg) daily. Patient is requesting a call back from Dr. Elmarie Shiley nurse to further discuss.

## 2019-11-03 NOTE — Telephone Encounter (Signed)
I agree with note by Cheral Marker, RN. I will have to defer the management of his anxiety meds to his primary but this does sound like a fairly drastic and rapid reduction of his medications .

## 2019-11-03 NOTE — Telephone Encounter (Signed)
Pt is calling in to report that he tried to refill several of his medications after his recent hospital stay but his PCP required a OV. Pt went in to the OV and was told that his Klonopin was being decreased from 4 (0.5 mg) tablets to only one (0.5mg ) tablet per day. The patient is very upset and frustrated because he states that his PCP, Dr. Osborne Casco and his nurse are adamant that the patient was informed previously of the decrease in his Klonopin. The patient states he is extremely stressed when he does not take his normal dose of Klonopin-he states that he knows he is hooked on this medication but he would rather be hooked on this medication than hard drugs or alcohol. He reports that no explanation has been given for why this medication was decreased. He has called his PCP back and has requested a dosage increase but he has denied the request. His pharmacist told him that he should not be decreasing the dose this drastically rather than tapering the medication.   Spoke with Dr. Acie Fredrickson who advises patient reach back out to PCP to straighten this out being that Dr. Acie Fredrickson does no prescribe benzodiazepines.  Called Dr. Loren Racer office in hopes of assisting the patient and spoke with Caryl Pina. She states that their office is aware that the patient is requesting the original dose but Dr. Osborne Casco has changed the Rx to 0.5 mg daily.   Pt aware to follow up with PCP for increased dose. He verbalized understanding.

## 2019-11-13 ENCOUNTER — Telehealth: Payer: Self-pay | Admitting: Cardiovascular Disease

## 2019-11-13 NOTE — Telephone Encounter (Signed)
Agree with having him record the data and we will be able to make better decisions.     Mertie Moores, MD  11/13/2019 5:23 PM    [place],  [address] [place], [place]  [address] Phone: [phone]; Fax: [phone]

## 2019-11-13 NOTE — Telephone Encounter (Signed)
I spoke with patient. He reports he has been on new medications since surgery in June. He reports a couple of weeks ago his BP was 86/44 and his readings have been " all over the board."  Yesterday it was 97/ 40 something.  His machine does not keep record of readings and he has not been writing them down. No other readings available He is taking Imdur, Losartan-HCTZ and metoprolol as listed.  Sometimes he only takes one dose of metoprolol due to BP.  BP was 118/64 at recent visit with K. Grandville Silos, [place].   He usually checks BP every morning. I asked him to check BP and heart rate prior to taking AM medications and then 2 hours after taking medications and keep record of readings.  He will call readings to office in 10-14 days.  Will forward to Dr Acie Fredrickson to see if any changes need to be made at this time.

## 2019-11-13 NOTE — Telephone Encounter (Signed)
Patient states he had a heart valve replacement, he states a lot of doctor have giving different BP medication. He wants discuss the medication he is on with Dr. Acie Fredrickson or with Sharyn Lull.  He stated he has a CT scan schedule for this coming Wednesday, he is wondering if he can come in that day.

## 2019-11-15 ENCOUNTER — Other Ambulatory Visit: Payer: Self-pay

## 2019-11-15 ENCOUNTER — Ambulatory Visit (INDEPENDENT_AMBULATORY_CARE_PROVIDER_SITE_OTHER)
Admission: RE | Admit: 2019-11-15 | Discharge: 2019-11-15 | Disposition: A | Discharge status: Home / Self Care | Payer: Medicare Other | Source: Ambulatory Visit | Attending: Physician Assistant | Admitting: Physician Assistant

## 2019-11-15 DIAGNOSIS — R911 Solitary pulmonary nodule: Secondary | ICD-10-CM

## 2019-11-16 ENCOUNTER — Telehealth: Payer: Self-pay | Admitting: *Deleted

## 2019-11-16 NOTE — Telephone Encounter (Signed)
Patient had a End of Service report for his [employer] cardiac event monitor posted which showed the patient wore his monitor only 6 minutes.  [employer] representative Evelena Asa contacted and charges for cardiac event monitor will be cancelled. Offered to have another monitor applied 11/20/19 after the patients office visit with Dr. Acie Fredrickson.  Patient declined stating he did not want to wear the monitor.

## 2019-11-16 NOTE — Telephone Encounter (Addendum)
Patient aware.  He is seeing Dr Acie Fredrickson on 11/20/19 and will bring readings to this appointment

## 2019-11-19 ENCOUNTER — Encounter: Payer: Self-pay | Admitting: Cardiovascular Disease

## 2019-11-19 NOTE — Progress Notes (Signed)
Cardiology Office Note   Date:  11/19/2019   ID:  Alexis Griffith [birth year], MRN [id number]  PCP:  Haywood Pao, MD  Cardiologist:   Mertie Moores, MD   Chief Complaint  Patient presents with  . Coronary Artery Disease  . Aortic Stenosis   Problem List  1. Premature ventricular contractions 2. Anxiety 3. Hypertension 4. Hyperlipidemia 5. 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 Bilateral carotid artery disease - moderate 7. 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 TIA 9. Mild - moderate Aortic stenosis     Alexis Griffith is a 79 y.o. male who presents for further evaluation of his palpitations and HTN.  He is having some muscle aches - especially in the morning.  Seems to be bilateral shoulder pain. He has held the pravachol for several weeks but the pain did not resolved. BP has been a little high. Tried taking 1 1/2 of the Losartan / HCTZ and has occasionally takes extra metoprolol.  September 20, 2014:  Alexis Griffith is doing well.  BP has been well controlled.  Tried a dose of Valsartan and got dizzy, he went back to Losartan after that and has done well.  He needs to have cataract surgery .   Jan. 30, 2017: Doing well  He did not have his cataract surgery as scheduled.    Recent labs look good Aches and pains but otherwise doing ok  Aug. 16, 2017:  Doing ok BP has been well controlled.  Having some left shoulder problems   - hurts at night. 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  No CP ,   Still has occasional elevated HTN  Sept. 25, 2018: Doing well.  Is followed by [name] for a past CVA and is on Aggrenox ( flagged the ASA dose alert )   Jul 13, 2017:   Alexis Griffith is doing well. Is not exercising  Is having some balance issues.   No CP  Still stress in his life.   PVCs seem to be well controlled.  Is taking metoprolol 50 mg TID   April 12, 2018:  No CP , no dyspnea.  Palpitations are under control   May 24, 2019: Alexis Griffith is seen today for follow-up of his hypertension, palpitations, mild aortic stenosis, hyperlipidemia, anxiety. Has rare episodes of chest pain - randomly  No exercising regularly at all.    Jul 04, 2019:  Alexis Griffith is seen today.  He had complained of chest pain at his last office visit . 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  Ct also showd a 4 mm pulmonary nodule.  He is here to discuss cath .  The cp is on and off.   At times its related to exertion He is not exercising regularly .   Sept. 20, 2021 Alexis Griffith is s/p TAVR since I last saw him. Also recent hospitalization for sepsis, syncope   Hx of CAD, AS, HLD, DM, ? Left renal mass ( ? Cancer )   On Aug. 20 he was admitted with syncope.  Had an indwelling foley develped sepsis from urosepsis  Work up also identified pulmonary nodules.  Has an indwelling foley catheter.   Due to BPH, Also has a left renal mass.     Past Medical History:  Diagnosis Date  . Anxiety   . 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 CAD (coronary artery disease)   .  Carotid artery disease ([place])    CTA 08/2019 of the head and neck show moderate 65 to 75% bilateral ICA stenoses   . Chronic diastolic CHF (congestive heart failure) ([place])   . Diabetes mellitus type 2 in nonobese ([facility])   . Excessive urination at night 06/05/2013  . Giant cell arteritis ([place]) 05/17/2012  . Hyperlipidemia   . Hypertension   . Hypertensive urgency   . Hypomagnesemia 09/11/2019  . Mild anemia 09/11/2019  . NSTEMI (non-ST elevated myocardial infarction) ([place]) 09/09/2019  . Orthostatic hypotension 09/11/2019  . Premature atrial contractions 09/12/2019  . Pulmonary nodules   . PVC (premature ventricular contraction)   . Renal mass   . 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 Burt Knack & Dr. Cyndia Bent  . Severe aortic stenosis   . Sinus  bradycardia on ECG   . Sleep apnea   . Squamous cell carcinoma of tongue [facility]) September 2012   Followed by Dr. Wilburn Cornelia.  . Stroke ([place])   . TIA (transient ischemic attack)   . Urinary retention 09/11/2019    Past Surgical History:  Procedure Laterality Date  . CATARACT EXTRACTION, BILATERAL  10/2015, and 11/2015   with adding  TORIC lenses bilaterally   . INTRAVASCULAR PRESSURE WIRE/FFR STUDY N/A 07/07/2019   Procedure: INTRAVASCULAR PRESSURE WIRE/FFR STUDY;  Surgeon: [name], MD;  Location: [place] [facility];  Service: Cardiovascular;  Laterality: N/A;  . LEFT HEART CATH AND CORONARY ANGIOGRAPHY N/A 07/07/2019   Procedure: LEFT HEART CATH AND CORONARY ANGIOGRAPHY;  Surgeon: [name], MD;  Location: [place] [facility];  Service: Cardiovascular;  Laterality: N/A;  . SQUAMOUS CELL CARCINOMA EXCISION     tongue  . TEE WITHOUT CARDIOVERSION N/A 08/15/2019   Procedure: TRANSESOPHAGEAL ECHOCARDIOGRAM (TEE);  Surgeon: Sherren Mocha, MD;  Location: [facility];  Service: Open Heart Surgery;  Laterality: N/A;  . TRANSCATHETER AORTIC VALVE REPLACEMENT, TRANSFEMORAL N/A 08/15/2019   Procedure: TRANSCATHETER AORTIC VALVE REPLACEMENT, TRANSFEMORAL using Edwards [employer] SAPIEN 3 Ultra 26 mm THV.;  Surgeon: Sherren Mocha, MD;  Location: [facility];  Service: Open Heart Surgery;  Laterality: N/A;     Current Outpatient Medications  Medication Sig Dispense Refill  . acetaminophen (TYLENOL) 325 MG tablet Take 650 mg by mouth every 6 (six) hours as needed for moderate pain or headache.    Marland Kitchen alum & mag hydroxide-simeth (MAALOX PLUS) 400-400-40 MG/5ML suspension Take 10 mLs by mouth every 4 (four) hours as needed for indigestion.    Marland Kitchen aspirin 81 MG chewable tablet Chew 1 tablet (81 mg total) by mouth daily. 360 tablet 0  . azithromycin (ZITHROMAX) 500 MG tablet Take 1 tablet (500 mg) one hour prior  To all dental visits. 4 tablet 11  . carboxymethylcellulose (REFRESH PLUS) 0.5 % SOLN Place 1 drop  into both eyes every 12 (twelve) hours as needed (irritation).    . clonazePAM (KLONOPIN) 0.5 MG tablet Take 1 tablet (0.5 mg total) by mouth 4 (four) times daily. 12 tablet 0  . Coenzyme Q10 200 MG capsule Take 200 mg by mouth daily.    . fenofibrate 160 MG tablet Take 1 tablet (160 mg total) by mouth daily. 90 tablet 0  . finasteride (PROSCAR) 5 MG tablet Take 1 tablet (5 mg total) by mouth daily. 90 tablet 0  . glucose blood test strip 1 each by Other route as needed.     . isosorbide mononitrate (  IMDUR) 30 MG 24 hr tablet Take 1 tablet (30 mg total) by mouth daily. 30 tablet 0  . Lancets (ONETOUCH DELICA PLUS [id number]) MISC 1 each by Other route daily.     Marland Kitchen losartan-hydrochlorothiazide (HYZAAR) 100-25 MG tablet Take 1 tablet by mouth daily. 90 tablet 0  . magnesium hydroxide (MILK OF MAGNESIA) 400 MG/5ML suspension Take 30 mLs by mouth every 8 (eight) hours as needed for mild constipation.    . magnesium oxide (MAG-OX) 400 (241.3 Mg) MG tablet Take 1 tablet (400 mg total) by mouth daily. 30 tablet 3  . metoprolol tartrate (LOPRESSOR) 50 MG tablet Take 1 tablet (50 mg total) by mouth 2 (two) times daily. 180 tablet 0  . nitroGLYCERIN (NITROSTAT) 0.4 MG SL tablet Place 1 tablet (0.4 mg total) under the tongue every 5 (five) minutes as needed for chest pain (up to 3 doses). Do not give if blood pressure is low (less than 893 systolic). 12 tablet 0  . nystatin (MYCOSTATIN/NYSTOP) powder Apply 1 application topically daily. groin    . ONE TOUCH ULTRA TEST test strip 1 each by Other route daily.     . polyethylene glycol (MIRALAX / GLYCOLAX) 17 g packet Take 17 g by mouth daily as needed for mild constipation. 14 each 0  . rosuvastatin (CRESTOR) 20 MG tablet Take 1 tablet (20 mg total) by mouth daily. 90 tablet 0  . sitaGLIPtin (JANUVIA) 100 MG tablet Take 100 mg by mouth daily.     No current facility-administered medications for this visit.    Allergies:   Macrodantin [nitrofurantoin], Tape,  Sulfa antibiotics, Lisinopril, Nutritional supplements, and Penicillins    Social History:  The patient  reports that he quit smoking about 44 years ago. He has never used smokeless tobacco. He reports that he does not drink alcohol and does not use drugs.   Family History:  The patient's family history includes Heart attack in his father and mother; Heart disease in his father and mother.    ROS:   Noted in current history, otherwise review of systems is negative.   Physical Exam: There were no vitals taken for this visit.  GEN:  Well nourished, well developed in no acute distress HEENT: Normal NECK: No JVD; bilat carotid bruits soft  LYMPHATICS: No lymphadenopathy CARDIAC: RRR ,  Soft systolic murmur  RESPIRATORY:  Clear to auscultation without rales, wheezing or rhonchi  ABDOMEN: Soft, non-tender, non-distended MUSCULOSKELETAL:  No edema; No deformity  SKIN: Warm and dry NEUROLOGIC:  Alert and oriented x 3    EKG:     Recent Labs: 09/09/2019: B Natriuretic Peptide 326.2 10/01/2019: TSH 1.472 10/21/2019: ALT 15; Hemoglobin 12.1; Magnesium 1.5; Platelets 136 10/22/2019: BUN 6; Creatinine, Ser 0.96; Potassium 3.7; Sodium 137    Lipid Panel    Component Value Date/Time   CHOL 130 09/09/2019 0308   CHOL 139 06/14/2019 0959   TRIG 68 09/09/2019 0308   HDL 40 (L) 09/09/2019 0308   HDL 35 (L) 06/14/2019 0959   CHOLHDL 3.3 09/09/2019 0308   VLDL 14 09/09/2019 0308   LDLCALC 76 09/09/2019 0308   LDLCALC 80 06/14/2019 0959      Wt Readings from Last 3 Encounters:  10/26/19 184 lb 6.4 oz (83.6 kg)  10/22/19 174 lb 6.1 oz (79.1 kg)  10/10/19 178 lb 3.2 oz (80.8 kg)      Other studies Reviewed: Additional studies/ records that were reviewed today include: . Review of the above records demonstrates:  ASSESSMENT AND PLAN:  1.  Coronary artery disease:  Stable,   he was found to have three-vessel coronary artery disease at the time of heart catheterization in May,  2021.  He has a complete occlusion of the ostial right coronary artery with left-to-right collateral filling of the PDA and posterior lateral branch.  He has a long 70% proximal LAD stenosis and a 75% proximal circumflex stenosis.  He was found to have severe aortic stenosis.    He has had TAVR.  The plan is to continue with medical therapy.  We will refer him for PCI if he starts having episodes of angina.  For now he seems to be stable.  2.  Aortic stenosis-   he status post TAVR.  He seems to be doing well.  Takes azithromycin piror to dental work .   3. Hypertension - ,   BP is fairly well controll.  Advised him to measure his blood pressure about an hour after he he has taken his morning meds.  He was taking his blood pressure first thing in the morning.  4. Hyperlipidemia -   we will refill Crestor 10 mg a day.  He had several prescriptions all of varying doses of Crestor including 5 mg, 10 mg, 20 mg.  We will refill the 10 mg and check labs in 3 months.   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 Bilateral carotid artery disease -   he has soft bilateral carotid bruits.  Will see him in several months    Current medicines are reviewed at length with the patient today.  The patient does not have concerns regarding medicines.  The following changes have been made:  no change   Disposition:     Signed, Mertie Moores, MD  11/19/2019 9:09 PM    [address], [place], [place]  [address] Phone: [phone]; Fax: [phone]

## 2019-11-20 ENCOUNTER — Encounter: Payer: Self-pay | Admitting: Cardiovascular Disease

## 2019-11-20 ENCOUNTER — Other Ambulatory Visit: Payer: Self-pay

## 2019-11-20 ENCOUNTER — Ambulatory Visit (INDEPENDENT_AMBULATORY_CARE_PROVIDER_SITE_OTHER): Payer: Medicare Other | Admitting: Cardiovascular Disease

## 2019-11-20 VITALS — BP 146/58 | HR 58 | Ht 70.0 in | Wt 183.2 lb

## 2019-11-20 DIAGNOSIS — Z952 Presence of prosthetic heart valve: Secondary | ICD-10-CM | POA: Diagnosis not present

## 2019-11-20 DIAGNOSIS — I25118 Atherosclerotic heart disease of native coronary artery with other forms of angina pectoris: Secondary | ICD-10-CM

## 2019-11-20 DIAGNOSIS — I214 Non-ST elevation (NSTEMI) myocardial infarction: Secondary | ICD-10-CM | POA: Diagnosis not present

## 2019-11-20 DIAGNOSIS — I493 Ventricular premature depolarization: Secondary | ICD-10-CM

## 2019-11-20 MED ORDER — ROSUVASTATIN CALCIUM 10 MG PO TABS: 10.0000 mg | ORAL_TABLET | Freq: Every day | ORAL | 3 refills | Status: DC

## 2019-11-20 NOTE — Patient Instructions (Addendum)
Medication Instructions:  Crestor 10 mg daily has been called in for you. *If you need a refill on your cardiac medications before your next appointment, please call your pharmacy*  Lab Work: You will have fasting labs at your next visit. If you have labs (blood work) drawn today and your tests are completely normal, you will receive your results only by: Marland Kitchen MyChart Message (if you have MyChart) OR . A paper copy in the mail If you have any lab test that is abnormal or we need to change your treatment, we will call you to review the results.  Follow-Up: Your provider recommends that you schedule a follow-up appointment in 3 months with Alexis Griffith.

## 2019-12-01 ENCOUNTER — Other Ambulatory Visit: Payer: Self-pay

## 2019-12-01 ENCOUNTER — Observation Stay (HOSPITAL_COMMUNITY)
Admission: EM | Admit: 2019-12-01 | Discharge: 2019-12-02 | Disposition: A | Discharge status: Home / Self Care | Payer: Medicare Other | Attending: Emergency Medicine | Admitting: Emergency Medicine

## 2019-12-01 ENCOUNTER — Encounter (HOSPITAL_COMMUNITY): Payer: Self-pay

## 2019-12-01 ENCOUNTER — Emergency Department (HOSPITAL_COMMUNITY): Payer: Medicare Other

## 2019-12-01 DIAGNOSIS — I110 Hypertensive heart disease with heart failure: Secondary | ICD-10-CM | POA: Diagnosis not present

## 2019-12-01 DIAGNOSIS — Z8673 Personal history of transient ischemic attack (TIA), and cerebral infarction without residual deficits: Secondary | ICD-10-CM | POA: Diagnosis not present

## 2019-12-01 DIAGNOSIS — I25119 Atherosclerotic heart disease of native coronary artery with unspecified angina pectoris: Secondary | ICD-10-CM | POA: Diagnosis not present

## 2019-12-01 DIAGNOSIS — E1159 Type 2 diabetes mellitus with other circulatory complications: Secondary | ICD-10-CM | POA: Diagnosis present

## 2019-12-01 DIAGNOSIS — Z85810 Personal history of malignant neoplasm of tongue: Secondary | ICD-10-CM | POA: Insufficient documentation

## 2019-12-01 DIAGNOSIS — Z7982 Long term (current) use of aspirin: Secondary | ICD-10-CM | POA: Diagnosis not present

## 2019-12-01 DIAGNOSIS — Z20822 Contact with and (suspected) exposure to covid-19: Secondary | ICD-10-CM | POA: Diagnosis not present

## 2019-12-01 DIAGNOSIS — I2510 Atherosclerotic heart disease of native coronary artery without angina pectoris: Secondary | ICD-10-CM | POA: Diagnosis not present

## 2019-12-01 DIAGNOSIS — I5032 Chronic diastolic (congestive) heart failure: Secondary | ICD-10-CM | POA: Diagnosis present

## 2019-12-01 DIAGNOSIS — Z952 Presence of prosthetic heart valve: Secondary | ICD-10-CM

## 2019-12-01 DIAGNOSIS — I10 Essential (primary) hypertension: Secondary | ICD-10-CM | POA: Diagnosis not present

## 2019-12-01 DIAGNOSIS — R0789 Other chest pain: Principal | ICD-10-CM | POA: Insufficient documentation

## 2019-12-01 DIAGNOSIS — I152 Hypertension secondary to endocrine disorders: Secondary | ICD-10-CM | POA: Diagnosis present

## 2019-12-01 DIAGNOSIS — R079 Chest pain, unspecified: Secondary | ICD-10-CM

## 2019-12-01 DIAGNOSIS — I779 Disorder of arteries and arterioles, unspecified: Secondary | ICD-10-CM | POA: Diagnosis present

## 2019-12-01 DIAGNOSIS — E785 Hyperlipidemia, unspecified: Secondary | ICD-10-CM | POA: Diagnosis not present

## 2019-12-01 DIAGNOSIS — R339 Retention of urine, unspecified: Secondary | ICD-10-CM | POA: Diagnosis present

## 2019-12-01 DIAGNOSIS — Z79899 Other long term (current) drug therapy: Secondary | ICD-10-CM | POA: Diagnosis not present

## 2019-12-01 DIAGNOSIS — E119 Type 2 diabetes mellitus without complications: Secondary | ICD-10-CM

## 2019-12-01 DIAGNOSIS — Z87891 Personal history of nicotine dependence: Secondary | ICD-10-CM | POA: Diagnosis not present

## 2019-12-01 DIAGNOSIS — E1169 Type 2 diabetes mellitus with other specified complication: Secondary | ICD-10-CM | POA: Diagnosis not present

## 2019-12-01 DIAGNOSIS — G4730 Sleep apnea, unspecified: Secondary | ICD-10-CM | POA: Diagnosis present

## 2019-12-01 DIAGNOSIS — F419 Anxiety disorder, unspecified: Secondary | ICD-10-CM | POA: Diagnosis present

## 2019-12-01 LAB — CBC
HCT: 42.4 % (ref 39.0–52.0)
Hemoglobin: 14.1 g/dL (ref 13.0–17.0)
MCH: 26.5 pg (ref 26.0–34.0)
MCHC: 33.3 g/dL (ref 30.0–36.0)
MCV: 79.7 fL — ABNORMAL LOW (ref 80.0–100.0)
Platelets: 182 10*3/uL (ref 150–400)
RBC: 5.32 MIL/uL (ref 4.22–5.81)
RDW: 13.3 % (ref 11.5–15.5)
WBC: 9.4 10*3/uL (ref 4.0–10.5)
nRBC: 0 % (ref 0.0–0.2)

## 2019-12-01 LAB — BASIC METABOLIC PANEL
Anion gap: 12 (ref 5–15)
BUN: 24 mg/dL — ABNORMAL HIGH (ref 8–23)
CO2: 23 mmol/L (ref 22–32)
Calcium: 9.9 mg/dL (ref 8.9–10.3)
Chloride: 94 mmol/L — ABNORMAL LOW (ref 98–111)
Creatinine, Ser: 1.23 mg/dL (ref 0.61–1.24)
GFR calc Af Amer: >60 mL/min (ref >60)
GFR calc non Af Amer: 56 mL/min — ABNORMAL LOW (ref >60)
Glucose, Bld: 103 mg/dL — ABNORMAL HIGH (ref 70–99)
Potassium: 4.2 mmol/L (ref 3.5–5.1)
Sodium: 129 mmol/L — ABNORMAL LOW (ref 135–145)

## 2019-12-01 LAB — TROPONIN I (HIGH SENSITIVITY)
Troponin I (High Sensitivity): 26 ng/L — ABNORMAL HIGH (ref <18)
Troponin I (High Sensitivity): 26 ng/L — ABNORMAL HIGH (ref <18)
Troponin I (High Sensitivity): 32 ng/L — ABNORMAL HIGH (ref <18)

## 2019-12-01 LAB — RESPIRATORY PANEL BY RT PCR (FLU A&B, COVID)
Influenza A by PCR: NEGATIVE
Influenza B by PCR: NEGATIVE
SARS Coronavirus 2 by RT PCR: NEGATIVE

## 2019-12-01 LAB — MAGNESIUM: Magnesium: 1.8 mg/dL (ref 1.7–2.4)

## 2019-12-01 LAB — CBG MONITORING, ED: Glucose-Capillary: 91 mg/dL (ref 70–99)

## 2019-12-01 MED ORDER — ROSUVASTATIN CALCIUM 5 MG PO TABS
10.0000 mg | ORAL_TABLET | Freq: Every day | ORAL | Status: DC
  Administered 2019-12-02: 10 mg via ORAL
  Filled 2019-12-01: qty 2

## 2019-12-01 MED ORDER — ISOSORBIDE MONONITRATE ER 30 MG PO TB24
30.0000 mg | ORAL_TABLET | Freq: Every day | ORAL | Status: DC
  Administered 2019-12-02: 30 mg via ORAL
  Filled 2019-12-01: qty 1

## 2019-12-01 MED ORDER — ASPIRIN 81 MG PO CHEW
81.0000 mg | CHEWABLE_TABLET | Freq: Every day | ORAL | Status: DC
  Administered 2019-12-02: 81 mg via ORAL
  Filled 2019-12-01: qty 1

## 2019-12-01 MED ORDER — ONDANSETRON HCL 4 MG/2ML IJ SOLN: 4.0000 mg | Freq: Four times a day (QID) | INTRAMUSCULAR | Status: DC | PRN

## 2019-12-01 MED ORDER — RANOLAZINE ER 500 MG PO TB12
500.0000 mg | ORAL_TABLET | Freq: Two times a day (BID) | ORAL | Status: DC
  Administered 2019-12-01 – 2019-12-02 (×2): 500 mg via ORAL
  Filled 2019-12-01 (×3): qty 1

## 2019-12-01 MED ORDER — LOSARTAN POTASSIUM-HCTZ 100-25 MG PO TABS: 1.0000 | ORAL_TABLET | Freq: Every day | ORAL | Status: DC

## 2019-12-01 MED ORDER — NITROGLYCERIN 0.4 MG SL SUBL
0.4000 mg | SUBLINGUAL_TABLET | SUBLINGUAL | Status: AC | PRN
  Administered 2019-12-02 (×3): 0.4 mg via SUBLINGUAL
  Filled 2019-12-01: qty 1

## 2019-12-01 MED ORDER — CLONAZEPAM 0.5 MG PO TABS
0.5000 mg | ORAL_TABLET | Freq: Two times a day (BID) | ORAL | Status: DC
  Administered 2019-12-01 – 2019-12-02 (×2): 0.5 mg via ORAL
  Filled 2019-12-01 (×2): qty 1

## 2019-12-01 MED ORDER — FINASTERIDE 5 MG PO TABS
5.0000 mg | ORAL_TABLET | Freq: Every day | ORAL | Status: DC
  Administered 2019-12-02: 5 mg via ORAL
  Filled 2019-12-01: qty 1

## 2019-12-01 MED ORDER — LOSARTAN POTASSIUM 50 MG PO TABS
100.0000 mg | ORAL_TABLET | Freq: Every day | ORAL | Status: DC
  Administered 2019-12-02: 100 mg via ORAL
  Filled 2019-12-01: qty 2

## 2019-12-01 MED ORDER — ENOXAPARIN SODIUM 40 MG/0.4ML ~~LOC~~ SOLN
40.0000 mg | SUBCUTANEOUS | Status: DC
  Administered 2019-12-01: 40 mg via SUBCUTANEOUS
  Filled 2019-12-01: qty 0.4

## 2019-12-01 MED ORDER — HYDROCHLOROTHIAZIDE 25 MG PO TABS
25.0000 mg | ORAL_TABLET | Freq: Every day | ORAL | Status: DC
  Administered 2019-12-02: 25 mg via ORAL
  Filled 2019-12-01: qty 1

## 2019-12-01 MED ORDER — ACETAMINOPHEN 325 MG PO TABS
650.0000 mg | ORAL_TABLET | Freq: Four times a day (QID) | ORAL | Status: DC | PRN
  Administered 2019-12-02: 650 mg via ORAL
  Filled 2019-12-01: qty 2

## 2019-12-01 MED ORDER — ACETAMINOPHEN 650 MG RE SUPP: 650.0000 mg | Freq: Four times a day (QID) | RECTAL | Status: DC | PRN

## 2019-12-01 MED ORDER — FENOFIBRATE 160 MG PO TABS
160.0000 mg | ORAL_TABLET | Freq: Every day | ORAL | Status: DC
  Administered 2019-12-02: 160 mg via ORAL
  Filled 2019-12-01 (×2): qty 1

## 2019-12-01 MED ORDER — METOPROLOL TARTRATE 50 MG PO TABS
50.0000 mg | ORAL_TABLET | Freq: Two times a day (BID) | ORAL | Status: DC
  Administered 2019-12-01 – 2019-12-02 (×2): 50 mg via ORAL
  Filled 2019-12-01: qty 1
  Filled 2019-12-01: qty 2

## 2019-12-01 MED ORDER — ONDANSETRON HCL 4 MG PO TABS: 4.0000 mg | ORAL_TABLET | Freq: Four times a day (QID) | ORAL | Status: DC | PRN

## 2019-12-01 MED ORDER — SODIUM CHLORIDE 0.9% FLUSH
3.0000 mL | Freq: Two times a day (BID) | INTRAVENOUS | Status: DC
  Administered 2019-12-01 – 2019-12-02 (×2): 3 mL via INTRAVENOUS

## 2019-12-01 MED ORDER — INSULIN ASPART 100 UNIT/ML ~~LOC~~ SOLN: 0.0000 [IU] | Freq: Three times a day (TID) | SUBCUTANEOUS | Status: DC

## 2019-12-01 NOTE — ED Notes (Signed)
Patient provided with bagged dinner and sugar free sprite per diet order. Feeding self, tolerating well. Denies further needs

## 2019-12-01 NOTE — ED Provider Notes (Signed)
[facility] EMERGENCY DEPARTMENT Provider Note   CSN: [id number] Arrival date & time: 12/01/19  1154     History Chief Complaint  Patient presents with  . Chest Pain    Alexis Griffith is a 79 y.o. male with past medical history of TAVR, PVCs, NSTEMI, CAD follows with Dr. 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  On note from Dr. Cathie Olden 11/20/2019 patient has a long 59 proximal LAD stenosis and a 75% proximal circumflex stenosis.  At that point plan was to continue with medical therapy with consideration for referral of PCI if he starts having episodes of angina.  Patient reports that today he was "just sitting there" when he started having tightness in his chest.  He states that it did not radiate or move.  It lasted for about an hour and a half.  He states that he has not been sleeping well recently and has been trying to sleep in a recliner.  He denies shortness of breath when laying down or PND.  He states that he has been having this chest tightness intermittently over the past week.  He denies any medication changes.  He denies any noted aggravating or alleviating factors.      HPI     Past Medical History:  Diagnosis Date  . Anxiety   . 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 CAD (coronary artery disease)   . Carotid artery disease ([place])    CTA 08/2019 of the head and neck show moderate 65 to 75% bilateral ICA stenoses   . Chronic diastolic CHF (congestive heart failure) ([place])   . Diabetes mellitus type 2 in nonobese (HCC)   . Excessive urination at night 06/05/2013  . 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 Hyperlipidemia   . Hypertension   . Hypertensive urgency   . Hypomagnesemia 09/11/2019  . Mild anemia 09/11/2019  . NSTEMI (non-ST elevated myocardial infarction) ([place]) 09/09/2019  . Orthostatic hypotension 09/11/2019  . Premature atrial contractions 09/12/2019  .  Pulmonary nodules   . PVC (premature ventricular contraction)   . Renal mass   . 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 Burt Knack & Dr. Cyndia Bent  . Severe aortic stenosis   . Sinus bradycardia on ECG   . Sleep apnea   . 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 Wilburn Cornelia.  . Stroke ([place])   . TIA (transient ischemic attack)   . Urinary retention 09/11/2019    Patient Active Problem List   Diagnosis Date Noted  . Hypertension associated with diabetes ([place]) 12/01/2019  . Hyperlipidemia associated with type 2 diabetes mellitus ([place]) 12/01/2019  . UTI (urinary tract infection) due to urinary indwelling catheter ([place]) 10/20/2019  . UTI (urinary tract infection)   . Syncope   . Syncope and collapse 10/01/2019  . Abdominal pain   . CHF (congestive heart failure) ([place]) 09/25/2019  . Demand ischemia ([place]) 09/22/2019  . Premature atrial contractions 09/12/2019  . Urinary retention 09/11/2019  . Mild anemia 09/11/2019  . Hypomagnesemia 09/11/2019  . NSTEMI (non-ST elevated myocardial infarction) ([place]) 09/09/2019  . Chest pain   . Hypertensive urgency   . Chronic diastolic CHF (congestive heart failure) ([place]) 08/15/2019  . 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 Diabetes mellitus type 2 in nonobese (HCC)   .  Severe aortic stenosis   . Sleep apnea   . Renal mass   . CAD (coronary artery disease) 07/04/2019  . 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 Excessive urination at night 06/05/2013  . TIA (transient ischemic attack) 07/27/2012  . 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 Carotid artery disease ([place]) 08/24/2011  . PVC's (premature ventricular contractions)   . Labile hypertension   . Hyperlipidemia LDL goal <70   . Anxiety   . Sinus bradycardia on ECG     Past Surgical History:  Procedure Laterality Date  . CATARACT EXTRACTION, BILATERAL  10/2015,  and 11/2015   with adding  TORIC lenses bilaterally   . INTRAVASCULAR PRESSURE WIRE/FFR STUDY N/A 07/07/2019   Procedure: INTRAVASCULAR PRESSURE WIRE/FFR STUDY;  Surgeon: [name], MD;  Location: [facility] CV LAB;  Service: Cardiovascular;  Laterality: N/A;  . LEFT HEART CATH AND CORONARY ANGIOGRAPHY N/A 07/07/2019   Procedure: LEFT HEART CATH AND CORONARY ANGIOGRAPHY;  Surgeon: [name], MD;  Location: [facility] CV LAB;  Service: Cardiovascular;  Laterality: N/A;  . SQUAMOUS CELL CARCINOMA EXCISION     tongue  . TEE WITHOUT CARDIOVERSION N/A 08/15/2019   Procedure: TRANSESOPHAGEAL ECHOCARDIOGRAM (TEE);  Surgeon: Sherren Mocha, MD;  Location: [facility];  Service: Open Heart Surgery;  Laterality: N/A;  . TRANSCATHETER AORTIC VALVE REPLACEMENT, TRANSFEMORAL N/A 08/15/2019   Procedure: TRANSCATHETER AORTIC VALVE REPLACEMENT, TRANSFEMORAL using Edwards [employer] SAPIEN 3 Ultra 26 mm THV.;  Surgeon: Sherren Mocha, MD;  Location: [facility];  Service: Open Heart Surgery;  Laterality: N/A;       Family History  Problem Relation Age of Onset  . Heart disease Mother   . Heart attack Mother   . Heart disease Father   . Heart attack Father     Social History   Tobacco Use  . Smoking status: Former Smoker    Quit date: 06/25/1975    Years since quitting: 44.4  . Smokeless tobacco: Never Used  Vaping Use  . Vaping Use: Never used  Substance Use Topics  . Alcohol use: No  . Drug use: No    Home Medications Prior to Admission medications   Medication Sig Start Date End Date Taking? Authorizing Provider  acetaminophen (TYLENOL) 325 MG tablet Take 650 mg by mouth every 6 (six) hours as needed for moderate pain or headache.   Yes [provider]  alum & mag hydroxide-simeth (MAALOX PLUS) 400-400-40 MG/5ML suspension Take 10 mLs by mouth every 4 (four) hours as needed for indigestion.   Yes [provider]  aspirin 81 MG chewable tablet Chew 1 tablet (81 mg total)  by mouth daily. 10/03/19  Yes Hall, Carole N, DO  azithromycin (ZITHROMAX) 500 MG tablet Take 1 tablet (500 mg) one hour prior  To all dental visits. Patient taking differently: Take 500 mg by mouth See admin instructions. Take 1 tablet (500 mg) one hour prior  To all dental visits. 10/26/19  Yes Eileen Stanford, PA-C  carboxymethylcellulose (REFRESH PLUS) 0.5 % SOLN Place 1 drop into both eyes every 12 (twelve) hours as needed (irritation).   Yes [provider]  clonazePAM (KLONOPIN) 0.5 MG tablet Take 1 tablet (0.5 mg total) by mouth 4 (four) times daily. Patient taking differently: Take 0.5 mg by mouth 2 (two) times daily.  10/03/19  Yes Irene Pap N, DO  Coenzyme Q10 200 MG capsule Take 200 mg by mouth daily.   Yes [provider]  FENOFIBRATE PO Take 135  mg by mouth daily.   Yes [provider]  finasteride (PROSCAR) 5 MG tablet Take 1 tablet (5 mg total) by mouth daily. 10/23/19 01/21/20 Yes [place] Eric J, DO  isosorbide mononitrate (IMDUR) 30 MG 24 hr tablet Take 1 tablet (30 mg total) by mouth daily. 10/03/19 12/01/19 Yes Hall, Carole N, DO  losartan-hydrochlorothiazide (HYZAAR) 100-25 MG tablet Take 1 tablet by mouth daily. 10/23/19 01/21/20 Yes [place] Eric J, DO  magnesium oxide (MAG-OX) 400 (241.3 Mg) MG tablet Take 1 tablet (400 mg total) by mouth daily. 09/24/19  Yes Barrett, Evelene Croon, PA-C  metoprolol tartrate (LOPRESSOR) 50 MG tablet Take 1 tablet (50 mg total) by mouth 2 (two) times daily. 10/23/19 01/21/20 Yes [place] Eric J, DO  nitroGLYCERIN (NITROSTAT) 0.4 MG SL tablet Place 1 tablet (0.4 mg total) under the tongue every 5 (five) minutes as needed for chest pain (up to 3 doses). Do not give if blood pressure is low (less than 793 systolic). 10/23/19  Yes [place] Eric J, DO  nystatin (MYCOSTATIN/NYSTOP) powder Apply 1 application topically daily. groin   Yes [provider]  rosuvastatin (CRESTOR) 10 MG tablet Take 1 tablet (10 mg total) by mouth  daily. 11/20/19 11/14/20 Yes Nahser, Wonda Cheng, MD  sitaGLIPtin (JANUVIA) 100 MG tablet Take 100 mg by mouth daily.   Yes [provider]  glucose blood test strip 1 each by Other route as needed.  05/12/12   [provider]  Lancets [facility] DELICA PLUS [id number]) [place] 1 each by Other route daily.  03/23/18   [provider]  ONE TOUCH ULTRA TEST test strip 1 each by Other route daily.  05/12/12   [provider]  polyethylene glycol (MIRALAX / GLYCOLAX) 17 g packet Take 17 g by mouth daily as needed for mild constipation. Patient not taking: Reported on 12/01/2019 09/23/19   Barrett, Evelene Croon, PA-C    Allergies    Macrodantin [nitrofurantoin], Tape, Sulfa antibiotics, Lisinopril, Nutritional supplements, and Penicillins  Review of Systems   Review of Systems  Constitutional: Negative for chills and fever.  Respiratory: Positive for chest tightness. Negative for shortness of breath.   Cardiovascular: Positive for chest pain. Negative for palpitations and leg swelling.  Gastrointestinal: Negative for abdominal pain, diarrhea, nausea and vomiting.  Musculoskeletal: Negative for back pain.  Skin: Negative for color change and rash.  Neurological: Negative for weakness and headaches.  All other systems reviewed and are negative.   Physical Exam Updated Vital Signs BP (!) 157/60   Pulse 70   Temp 98 F (36.7 C) (Oral)   Resp (!) 21   Ht 5\' 10"  (1.778 m)   Wt 81.6 kg   SpO2 97%   BMI 25.83 kg/m   Physical Exam Vitals and nursing note reviewed.  Constitutional:      General: He is not in acute distress.    Appearance: He is well-developed. He is not diaphoretic.  HENT:     Head: Normocephalic and atraumatic.  Eyes:     General: No scleral icterus.       Right eye: No discharge.        Left eye: No discharge.     Conjunctiva/sclera: Conjunctivae normal.  Neck:     Vascular: No JVD.  Cardiovascular:     Rate and Rhythm: Normal rate and regular  rhythm.     Pulses:          Dorsalis pedis pulses are 2+ on the right side and 2+ on  the left side.       Posterior tibial pulses are 2+ on the right side and 2+ on the left side.     Heart sounds: Murmur heard.   Pulmonary:     Effort: Pulmonary effort is normal. No tachypnea, accessory muscle usage or respiratory distress.     Breath sounds: Normal breath sounds. No stridor.  Chest:     Chest wall: No tenderness.  Abdominal:     General: There is no distension.     Palpations: Abdomen is soft.  Musculoskeletal:        General: No deformity.     Cervical back: Normal range of motion.     Right lower leg: No tenderness. No edema.     Left lower leg: No tenderness. No edema.  Skin:    General: Skin is warm and dry.  Neurological:     General: No focal deficit present.     Mental Status: He is alert.     Cranial Nerves: No cranial nerve deficit.     Motor: No abnormal muscle tone.  Psychiatric:        Behavior: Behavior normal.     ED Results / Procedures / Treatments   Labs (all labs ordered are listed, but only abnormal results are displayed) Labs Reviewed  BASIC METABOLIC PANEL - Abnormal; Notable for the following components:      Result Value   Sodium 129 (*)    Chloride 94 (*)    Glucose, Bld 103 (*)    BUN 24 (*)    GFR calc non Af Amer 56 (*)    All other components within normal limits  CBC - Abnormal; Notable for the following components:   MCV 79.7 (*)    All other components within normal limits  TROPONIN I (HIGH SENSITIVITY) - Abnormal; Notable for the following components:   Troponin I (High Sensitivity) 26 (*)    All other components within normal limits  TROPONIN I (HIGH SENSITIVITY) - Abnormal; Notable for the following components:   Troponin I (High Sensitivity) 26 (*)    All other components within normal limits  RESPIRATORY PANEL BY RT PCR (FLU A&B, COVID)  MAGNESIUM  TROPONIN I (HIGH SENSITIVITY)    EKG EKG  Interpretation  Date/Time:  Friday December 01 2019 12:15:24 EDT Ventricular Rate:  61 PR Interval:  198 QRS Duration: 108 QT Interval:  402 QTC Calculation: 404 R Axis:   11 Text Interpretation: Normal sinus rhythm Cannot rule out Inferior infarct , age undetermined Cannot rule out Anterior infarct , age undetermined Abnormal ECG Confirmed by Madalyn Rob [phone]) on 12/01/2019 2:33:25 PM   Radiology DG Chest 2 View  Result Date: 12/01/2019 CLINICAL DATA:  Chest pain EXAM: CHEST - 2 VIEW COMPARISON:  10/20/2019 FINDINGS: Aortic valve replacement. The heart size and mediastinal contours are within normal limits. Atherosclerotic calcification of the aortic knob. Both lungs are clear. The visualized skeletal structures are unremarkable. IMPRESSION: No active cardiopulmonary disease. Electronically Signed   By: Davina Poke D.O.   On: 12/01/2019 12:55    Procedures Procedures (including critical care time)  Medications Ordered in ED Medications  ranolazine (RANEXA) 12 hr tablet 500 mg (has no administration in time range)  nitroGLYCERIN (NITROSTAT) SL tablet 0.4 mg (has no administration in time range)    ED Course  I have reviewed the triage vital signs and the nursing notes.  Pertinent labs & imaging results that were available during my care of the patient  were reviewed by me and considered in my medical decision making (see chart for details).  Clinical Course as of Nov 30 2025  Fri Dec 01, 2019  1556 I spoke with Dr. Wilburn Cornelia on-call for [facility] cardiology, they will see patient in consult.   [EH]  1706 Received call back from card master.  Given number for oncall cardiology, paged.    [EH]  2021 I spoke with hospitalist who will see patient for admission.   [EH]    Clinical Course User Index [EH] Ollen Gross   MDM Rules/Calculators/A&P                         Patient is a 79 year old man who presents today for evaluation of chest pain.  He had an hour  and a half of centralized chest discomfort without radiation, diaphoresis or nausea.  This started while he was at rest and went away without clear alleviating factors noted.  He has extensive cardiac history including a known 70% LAD occlusion and recent TAVR.  Troponin x2 is 26, CBC is unremarkable.  BMP shows mild hyponatremia at 129.  Magnesium is normal.  Covid test is ordered.  Chest x-ray without acute abnormality.  EKG does not show acute ischemia.  Given the patient's multiple risk factors, concern for unstable anginal symptoms.  Cardiology is consulted.  Patient is seen by cardiologist who recommended medical admission given that patient started having a different kind of chest pain while they were in being hypertensive.  Medicine is consulted, I spoke with hospitalist Dr. Posey Pronto who will see patient for admission.  There was also concerned that patient's Klonopin had been significantly reduced during one of his hospital stays from 4 to 5 pills a day down to 1 pill a day and I do suspect the patient has some anxiety component as part of his symptoms.  .Note: Portions of this report may have been transcribed using voice recognition software. Every effort was made to ensure accuracy; however, inadvertent computerized transcription errors may be present  Final Clinical Impression(s) / ED Diagnoses Final diagnoses:  Atypical chest pain    Rx / DC Orders ED Discharge Orders    None       Lorin Glass, PA-C 12/01/19 2030    Deno Etienne, DO 12/01/19 2037

## 2019-12-01 NOTE — ED Notes (Signed)
Hospital bed ordered by unit secretary

## 2019-12-01 NOTE — Consult Note (Addendum)
Cardiology Consultation:   Patient ID: Alexis Griffith; [id number]; [birth year]   Admit date: 12/01/2019 Date of Consult: 12/01/2019  Primary Care Provider: Haywood Pao, MD Primary Cardiologist: Dr. Acie Fredrickson, MD   Patient Profile:   Alexis Griffith is a 79 y.o. 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 Phylliss Bob.  History of Present Illness:   Alexis Griffith is a 79 year old male with a history as stated above who presented to [facility] on 12/01/2019 with complaints of chest pain.  Patient reports that he was discharged from [place] approximately three weeks ago as described below. Since that time he states he has been experiencing lots of anxiety which has keep him up at night. He states that it is at times associated with chest pain however is not always. He reports that he was up all night Wednesday and and Thursday nights and on waking this AM hje was having chest tightness across his upper anterior chest. He reports his symptoms were not nearly as intense as when he had his MI. He states that he was previously taking Klonopin QID for anxiety for many years however recently this was not refilled by his PCP. He states that his PCP will only allow one tablet per day at this time. He states this is exacerbating his symptoms. He is currently chest pain free however continues to be very anxious.   In the ED, high-sensitivity troponin levels minimally elevated and flat at 26 with a repeat at 26. CXR with no active cardiopulmonary disease. EKG with minor changes and lateral leads from prior tracing on 10/20/2019 however no acute ST segment changes. BP is elevated.   Alexis Griffith TAVR  6/15/2 with prolonged hospital course due to issues withacute urinary retention,hematuria, AKI,UTI/epididymoorchitisrequiring antibiotic,MRI with 68mm infarct felt to be incidental/procedural related,andorthostatic hypotension with recurrent syncoperequiring midodrine, Mestinon, and short-termFlorinef.He was discharged to [facility].He required continued foley at discharge with plan to see urology as outpatient. He was readmitted 7/9-7/13/21 with chest pain,elevated blood pressure and ruled in for NSTEMI with peak troponin 3124. Case was reviewed with Dr. Burt Knack and Dr. Angelena Form and conservative approach was undertaken given that if he were to have PCI, he would have to be on DAPT which would be contraindicated given had had recentgrosshematuria requiring cessation of Plavix. He did tolerate 48 hours of heparin infusion. Anti-anginals/anti-hypertensiveswere titrated and his orthostatic agents were able to be discontinued. Regarding his renal mass, Dr. Tresa Moore with urology felt that given his recent cardiac issues he would not be a good candidate for renal mass resection due to risk of surgery.The plan was for him to see urology as an outpatientand Dr. Zettie Pho office was to contact the patient for rescheduling. Limited echo 09/10/19 showed stable findings 1 month post TAVR with normal function of the bioprosthetic valve, no perivalvular leak, normal LVEF, grade 1 DD.  After discharge he initially did well at the SNF however had fluctuating BPs and an episode of chest pain 10/01/19 therefore EMS was called once again and he was transported for further evaluation.  HST were found to be minimally elevated and stable at 23, 28 and 35 felt to be demand ischemia rather than new ACS.  Echocardiogram showed normal LVEF with G1 DD and stable TAVR  with a mean gradient at 13 mmHg and no perivalvular AI.  He had no further chest pain symptoms with suspicion that symptoms were secondary to anxiety.  He  was started on Ranexa 500 mg twice daily and was continued on ASA however no Plavix secondary to hematuria.  He was also continued on fenofibrate 160, Imdur 30, losartan 50, Lopressor 50 and Crestor 20.  An event monitor was ordered at cardiology sign off.   He was most recently seen by Dr. Acie Fredrickson 11/19/2019 in follow-up and was stable from a CV standpoint.  Note suggests referring him for possible PCI for episodes of angina however per last hospitalization for NSTEMI Case was discussed at length with interventional cardiology team and patient felt not to be a candidate for invasive procedure due to severe comorbid conditions.  Past Medical History:  Diagnosis Date  . Anxiety   . 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 CAD (coronary artery disease)   . Carotid artery disease ([place])    CTA 08/2019 of the head and neck show moderate 65 to 75% bilateral ICA stenoses   . Chronic diastolic CHF (congestive heart failure) ([place])   . Diabetes mellitus type 2 in nonobese (HCC)   . Excessive urination at night 06/05/2013  . Giant cell arteritis ([place]) 05/17/2012  . Hyperlipidemia   . Hypertension   . Hypertensive urgency   . Hypomagnesemia 09/11/2019  . Mild anemia 09/11/2019  . NSTEMI (non-ST elevated myocardial infarction) ([place]) 09/09/2019  . Orthostatic hypotension 09/11/2019  . Premature atrial contractions 09/12/2019  . Pulmonary nodules   . PVC (premature ventricular contraction)   . Renal mass   . 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 Burt Knack & Dr. Cyndia Bent  . Severe aortic stenosis   . Sinus bradycardia on ECG   . Sleep apnea   . 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 Wilburn Cornelia.  . Stroke ([place])   . TIA (transient ischemic attack)   . Urinary retention 09/11/2019    Past Surgical History:  Procedure Laterality Date  . CATARACT EXTRACTION, BILATERAL  10/2015, and 11/2015   with adding   TORIC lenses bilaterally   . INTRAVASCULAR PRESSURE WIRE/FFR STUDY N/A 07/07/2019   Procedure: INTRAVASCULAR PRESSURE WIRE/FFR STUDY;  Surgeon: [name], MD;  Location: [place];  Service: Cardiovascular;  Laterality: N/A;  . LEFT HEART CATH AND CORONARY ANGIOGRAPHY N/A 07/07/2019   Procedure: LEFT HEART CATH AND CORONARY ANGIOGRAPHY;  Surgeon: [name], MD;  Location: [place] [facility];  Service: Cardiovascular;  Laterality: N/A;  . SQUAMOUS CELL CARCINOMA EXCISION     tongue  . TEE WITHOUT CARDIOVERSION N/A 08/15/2019   Procedure: TRANSESOPHAGEAL ECHOCARDIOGRAM (TEE);  Surgeon: Sherren Mocha, MD;  Location: [facility];  Service: Open Heart Surgery;  Laterality: N/A;  . TRANSCATHETER AORTIC VALVE REPLACEMENT, TRANSFEMORAL N/A 08/15/2019   Procedure: TRANSCATHETER AORTIC VALVE REPLACEMENT, TRANSFEMORAL using Edwards [employer] SAPIEN 3 Ultra 26 mm THV.;  Surgeon: Sherren Mocha, MD;  Location: [facility];  Service: Open Heart Surgery;  Laterality: N/A;     Prior to Admission medications   Medication Sig Start Date End Date Taking? Authorizing Provider  acetaminophen (TYLENOL) 325 MG tablet Take 650 mg by mouth every 6 (six) hours as needed for moderate pain or headache.    [provider]  alum & mag hydroxide-simeth (MAALOX PLUS) 400-400-40 MG/5ML suspension Take  10 mLs by mouth every 4 (four) hours as needed for indigestion.    [provider]  aspirin 81 MG chewable tablet Chew 1 tablet (81 mg total) by mouth daily. 10/03/19   Kayleen Memos, DO  azithromycin (ZITHROMAX) 500 MG tablet Take 1 tablet (500 mg) one hour prior  To all dental visits. 10/26/19   Eileen Stanford, PA-C  carboxymethylcellulose (REFRESH PLUS) 0.5 % SOLN Place 1 drop into both eyes every 12 (twelve) hours as needed (irritation).    [provider]  clonazePAM (KLONOPIN) 0.5 MG tablet Take 1 tablet (0.5 mg total) by mouth 4 (four) times daily. 10/03/19   Kayleen Memos, DO    Coenzyme Q10 200 MG capsule Take 200 mg by mouth daily.    [provider]  doxycycline (VIBRA-TABS) 100 MG tablet Take 100 mg by mouth 2 (two) times daily. 11/17/19   [provider]  FENOFIBRATE PO Take 135 mg by mouth daily.    [provider]  finasteride (PROSCAR) 5 MG tablet Take 1 tablet (5 mg total) by mouth daily. 10/23/19 01/21/20  [place] Eric J, DO  glucose blood test strip 1 each by Other route as needed.  05/12/12   [provider]  isosorbide mononitrate (IMDUR) 30 MG 24 hr tablet Take 1 tablet (30 mg total) by mouth daily. 10/03/19 11/20/19  Kayleen Memos, DO  Lancets (ONETOUCH DELICA PLUS ZOXWRU04V) MISC 1 each by Other route daily.  03/23/18   [provider]  losartan-hydrochlorothiazide (HYZAAR) 100-25 MG tablet Take 1 tablet by mouth daily. 10/23/19 01/21/20  [place] Donnamarie Poag, DO  magnesium hydroxide (MILK OF MAGNESIA) 400 MG/5ML suspension Take 30 mLs by mouth every 8 (eight) hours as needed for mild constipation.    [provider]  magnesium oxide (MAG-OX) 400 (241.3 Mg) MG tablet Take 1 tablet (400 mg total) by mouth daily. 09/24/19   Barrett, Evelene Croon, PA-C  metoprolol tartrate (LOPRESSOR) 50 MG tablet Take 1 tablet (50 mg total) by mouth 2 (two) times daily. 10/23/19 01/21/20  [place] Donnamarie Poag, DO  nitroGLYCERIN (NITROSTAT) 0.4 MG SL tablet Place 1 tablet (0.4 mg total) under the tongue every 5 (five) minutes as needed for chest pain (up to 3 doses). Do not give if blood pressure is low (less than 409 systolic). 10/23/19   [place] Donnamarie Poag, DO  nystatin (MYCOSTATIN/NYSTOP) powder Apply 1 application topically daily. groin    [provider]  ONE TOUCH ULTRA TEST test strip 1 each by Other route daily.  05/12/12   [provider]  polyethylene glycol (MIRALAX / GLYCOLAX) 17 g packet Take 17 g by mouth daily as needed for mild constipation. 09/23/19   Barrett, Evelene Croon, PA-C  rosuvastatin (CRESTOR) 10 MG tablet Take 1  tablet (10 mg total) by mouth daily. 11/20/19 11/14/20  Nahser, Wonda Cheng, MD  sitaGLIPtin (JANUVIA) 100 MG tablet Take 100 mg by mouth daily.    [provider]    Inpatient Medications: Scheduled Meds:  Continuous Infusions:  PRN Meds:   Allergies:    Allergies  Allergen Reactions  . Macrodantin [Nitrofurantoin] Other (See Comments)    "blocked kidneys"   . Tape Other (See Comments)    "Tears my skin and leaves marks." PLEASE USE COBAN!!  . Sulfa Antibiotics Rash  . Lisinopril Cough  . Nutritional Supplements Other (See Comments)    Protein drink given to me "did something"  . Penicillins Other (See Comments)    08/30/19-  Patient can't remember, was 35+ years ago. OK with trying cephalexin in hospital    Social History:   Social History   Socioeconomic History  . Marital status: Married    Spouse name: Not on file  . Number of children: 2  . Years of education: Not on file  . Highest education level: Not on file  Occupational History  . Not on file  Tobacco Use  . Smoking status: Former Smoker    Quit date: 06/25/1975    Years since quitting: 44.4  . Smokeless tobacco: Never Used  Vaping Use  . Vaping Use: Never used  Substance and Sexual Activity  . Alcohol use: No  . Drug use: No  . Sexual activity: Yes  Other Topics Concern  . Not on file  Social History Narrative   Patient lives at home with his wife.    Social Determinants of Health   Financial Resource Strain:   . Difficulty of Paying Living Expenses: Not on file  Food Insecurity:   . Worried About Charity fundraiser in the Last Year: Not on file  . Ran Out of Food in the Last Year: Not on file  Transportation Needs:   . Lack of Transportation (Medical): Not on file  . Lack of Transportation (Non-Medical): Not on file  Physical Activity:   . Days of Exercise per Week: Not on file  . Minutes of Exercise per Session: Not on file  Stress:   . Feeling of Stress : Not on file  Social  Connections:   . Frequency of Communication with Friends and Family: Not on file  . Frequency of Social Gatherings with Friends and Family: Not on file  . Attends Religious Services: Not on file  . Active Member of Clubs or Organizations: Not on file  . Attends Archivist Meetings: Not on file  . Marital Status: Not on file  Intimate Partner Violence:   . Fear of Current or Ex-Partner: Not on file  . Emotionally Abused: Not on file  . Physically Abused: Not on file  . Sexually Abused: Not on file    Family History:   Family History  Problem Relation Age of Onset  . Heart disease Mother   . Heart attack Mother   . Heart disease Father   . Heart attack Father    Family Status:  Family Status  Relation Name Status  . Mother  Deceased  . Father  Deceased  . PGF  Deceased  . PGM  Deceased  . MGF  Deceased  . MGM  Deceased    ROS:  Please see the history of present illness.  All other ROS reviewed and negative.     Physical Exam/Data:   Vitals:   12/01/19 1202 12/01/19 1220 12/01/19 1323 12/01/19 1427  BP: (!) 161/60  (!) 138/58 (!) 166/90  Pulse: (!) 55  (!) 52 (!) 51  Resp: 16  16 16   Temp: 98 F (36.7 C)   98 F (36.7 C)  TempSrc: Oral   Oral  SpO2: 98%  98% 100%  Weight:  81.6 kg    Height:  5\' 10"  (1.778 m)     No intake or output data in the 24 hours ending 12/01/19 1720 Filed Weights   12/01/19 1220  Weight: 81.6 kg   Body mass index is 25.83 kg/m.   General: Well developed, well nourished, NAD Neck: Negative for carotid bruits. No JVD Lungs:Clear to ausculation bilaterally. No wheezes, rales, or  rhonchi. Breathing is unlabored. Cardiovascular: RRR with S1 S2. No murmurs Abdomen: Soft, non-tender, non-distended. No obvious abdominal masses. Extremities: No edema.  Radial pulses 2+ bilaterally Neuro: Alert and oriented. No focal deficits. No facial asymmetry. MAE spontaneously. Psych: Responds to questions appropriately with normal affect.       EKG:  The EKG was personally reviewed and demonstrates: 12/01/2019 with NSR, no acute ST changes Telemetry:  Telemetry was personally reviewed and demonstrates: 12/01/2019 NSR  Laboratory Data:  Chemistry Recent Labs  Lab 12/01/19 1225  NA 129*  K 4.2  CL 94*  CO2 23  GLUCOSE 103*  BUN 24*  CREATININE 1.23  CALCIUM 9.9  GFRNONAA 56*  GFRAA >60  ANIONGAP 12    Total Protein  Date Value Ref Range Status  10/21/2019 5.4 (L) 6.5 - 8.1 g/dL Final  06/14/2019 6.9 6.0 - 8.5 g/dL Final   Albumin  Date Value Ref Range Status  10/21/2019 3.0 (L) 3.5 - 5.0 g/dL Final  06/14/2019 4.6 3.7 - 4.7 g/dL Final   AST  Date Value Ref Range Status  10/21/2019 16 15 - 41 U/L Final   ALT  Date Value Ref Range Status  10/21/2019 15 0 - 44 U/L Final   Alkaline Phosphatase  Date Value Ref Range Status  10/21/2019 32 (L) 38 - 126 U/L Final   Total Bilirubin  Date Value Ref Range Status  10/21/2019 0.5 0.3 - 1.2 mg/dL Final   Bilirubin Total  Date Value Ref Range Status  06/14/2019 0.3 0.0 - 1.2 mg/dL Final   Hematology Recent Labs  Lab 12/01/19 1225  WBC 9.4  RBC 5.32  HGB 14.1  HCT 42.4  MCV 79.7*  MCH 26.5  MCHC 33.3  RDW 13.3  PLT 182   Cardiac EnzymesNo results for input(s): TROPONINI in the last 168 hours. No results for input(s): TROPIPOC in the last 168 hours.  BNPNo results for input(s): BNP, PROBNP in the last 168 hours.  DDimer No results for input(s): DDIMER in the last 168 hours. TSH:  Lab Results  Component Value Date   TSH 1.472 10/01/2019   Lipids: Lab Results  Component Value Date   CHOL 130 09/09/2019   HDL 40 (L) 09/09/2019   LDLCALC 76 09/09/2019   TRIG 68 09/09/2019   CHOLHDL 3.3 09/09/2019   HgbA1c: Lab Results  Component Value Date   HGBA1C 7.1 (H) 10/20/2019    Radiology/Studies:  DG Chest 2 View  Result Date: 12/01/2019 CLINICAL DATA:  Chest pain EXAM: CHEST - 2 VIEW COMPARISON:  10/20/2019 FINDINGS: Aortic valve replacement.  The heart size and mediastinal contours are within normal limits. Atherosclerotic calcification of the aortic knob. Both lungs are clear. The visualized skeletal structures are unremarkable. IMPRESSION: No active cardiopulmonary disease. Electronically Signed   By: Davina Poke D.O.   On: 12/01/2019 12:55   Assessment and Plan:   1. Coronary artery disease with recurrent chest pain:   -LHC 07/2019 with three vessel CAD with a complete occlusion of the ostial right coronary artery with left-to-right collateral filling of the PDA and posterior lateral branch. He has a long 70% proximal LAD stenosis and a 75% proximal circumflex stenosis.  He was found to have severe aortic stenosis.  -Admitted 7/9-7/13/21 with chest pain,elevated blood pressure and ruled in for NSTEMI with peak troponin 3124>>Case was reviewed with Dr. Burt Knack and Dr. Angelena Form and conservative approach was undertaken given that if he were to have PCI, he would have to be on DAPT  which would be contraindicated given had had recentgrosshematuria requiring cessation of Plavix. -Anti-anginals/anti-hypertensiveswere titrated and his orthostatic agents were able to be discontinued.  -Unfortunately, at some point Ranexa was discontinued for unclear reasons therefore will restart at 500 mg p.o. daily -As below, patient reports a long 25 history of Klonopin use 4 times daily which has recently been decreased to 1 time daily with increased anxiety which he feels is precipitating his chest pain symptoms.  Discussed close follow-up with PCP who has offered referral to psychiatry (previously deferred by patient) which seems like a good option for him at this time for further management. -Low suspicion for cardiac etiology of his chest pain given flat, stable enzymes and no changes in EKG.  2. Anxiety: -Long history of Klonopin use with recent decrease in dosing to 1 time daily with increased anxiety -Recommend utilizing psychiatry referral  offered by PCP for further management  3. Aortic stenosis: -Severe AS found per cath as above, now s/p TAVR -Last echo with stable EF at 60% and normally functioning TAVR with a mean gradient at 62mmHg  -No symptoms suggesting abnormalities  4. Hypertension: -Elevated today however suspect this is in the setting of increased anxiety -Previously had issues with orthostatic hypotension, can cautiously titrate regimen but would focus on controlling pain/anxiety first  5. Urinary retention with hematuria: -Continues to have Foley in place with plans for follow-up with Dr. Tresa Moore next week   6. Renal cell carcinoma:  -Followed by Dr. Tresa Moore.  -Plan for continue follow up now with eventual resection    7. Pulmonary nodules:  -Recent PET scan showed a dominant right lower lobe 1.2 cm solid pulmonary nodule is non-hypermetabolic. Additional small solid subcentimeter pulmonary nodules in both lungs are below PET resolution and warrant attention on follow-up chest CT in 3 months.    For questions or updates, please contact [facility] Please consult [email or web address] for contact info under Cardiology/STEMI.   Lyndel Safe NP-C [place] Pager: [phone] 12/01/2019 5:20 PM   Patient seen and examined.  Agree with above documentation.  Alexis Griffith is a 79 year old male with a history of severe aortic stenosis status post TAVR 08/15/2019, multivessel CAD that has been medically managed, chronic diastolic heart failure, diabetes, hypertension, CVA, giant cell arteritis, suspected renal cell carcinoma, carotid artery disease who we are consulted by Dr. Phylliss Bob for the evaluation of chest pain.  He was discharged from SNF 3 weeks ago and has been having a lot of anxiety since that time.  States that he notes chest pain during episodes of anxiety.  Reports he has not been sleeping well because of his anxiety.  This morning he had onset of chest tightness across his upper chest.  States it  was 3-4 out of 10 in intensity.  Lasted for about 1.5 hours.  Reports his Klonopin dose was recently reduced from 4 times daily to once daily which has been contributing to anxiety.  On presentation to the ED, EKG showed normal sinus rhythm, rate 61, nonspecific ST changes.  Telemetry shows sinus rhythm in the 60s.  Vital signs notable for initial BP 161/60, pulse 55, SPO2 98% on room air.  Labs notable for sodium 129, creatinine 1.2, hemoglobin 14.1, WBC 9.4, high-sensitivity troponin 26 > 26.  On exam, patient is alert and oriented, regular rate and rhythm, 2/6 systolic murmur, lungs CTAB, no LE edema or JVD.  Suspect his pain from this morning represents noncardiac chest pain, has reports continuous chest pain for 1.5 hours  but flat troponins not consistent with an acute coronary syndrome.  As such, was planning for discharge from the ED when he reported the onset of a different type of chest pain, that he describes as 10 out of 10 left-sided pain.  BP up to 200s at that time.  Recommend rechecking EKG and trending troponins again given new chest pain.  Recommend admission to rule out MI and further control of his pain/anxiety.  Donato Heinz, MD

## 2019-12-01 NOTE — ED Triage Notes (Signed)
Pt reports chest pain that started this morning, pt also reports difficulty sleeping for the past 2 days, denies any other symptoms. Pt a.o

## 2019-12-01 NOTE — H&P (Signed)
History and Physical    Alexis Griffith OZH:[id number] DOB: 09-04-1940 DOA: 12/01/2019  PCP: Haywood Pao, MD  Patient coming from: Home  I have personally briefly reviewed patient's old medical records in [place]  Chief Complaint: Chest pain  HPI: Alexis Griffith is a 79 y.o. 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  Patient states around 10 AM morning of 12/01/2019 he developed pain across his chest described as an achy sensation.  Discomfort began while he was at rest and lasted about 1-1/2 hours before nearly resolving completely on arrival to the ED.  While in the ED, near time of cardiology evaluation he developed a different type of severe left-sided chest pain 10 out of 10 in intensity with radiation down his left arm.  He felt clammy with chills and also saw his systolic blood pressure read 204 on the monitor.  This chest pain was transient before resolving on its own.  He denies any associated lightheadedness/dizziness, dyspnea, cough, nausea, vomiting, abdominal pain.  Of note, patient states he has been on chronic clonazepam 0.5 mg 4 times daily for nearly 20 years for management of anxiety.  In August his PCP decreased his prescription to number 30/month, 1 tablet/day.  Patient states he is having difficulty with such a quick taper and was prescribed a new prescription for clonazepam 0.5 mg twice daily #60/month.  This prescription was filled 11/17/2019 patient says his anxiety has been fairly well controlled on twice daily dosing.  ED Course:  Initial vitals showed BP 161/60, pulse 55, RR 16, temp 98.0 Fahrenheit, SPO2 98% on room  air.  Labs show WBC 9.4, hemoglobin 14.1, platelets 182,000, sodium 129, potassium 4.2, bicarb 23, BUN 24, creatinine 1.23, serum glucose 103, high-sensitivity troponin I 26x2, magnesium 1.8.  2 view chest x-ray shows prior aortic valve replacement without focal consolidation, edema, or effusion.  Cardiology were consulted initially felt chest pain from this morning was noncardiac however patient then reported different type of left-sided 10 out of 10 chest pain.  He was noted to have increased SBP up to 204.  Repeat EKG, further troponin checks, and medical admission was then recommended by cardiology.  Patient was restarted on Ranexa 500 mg twice daily.  Sublingual nitroglycerin was ordered as needed.  The hospitalist service was consulted to admit for further evaluation and management.  Review of Systems: All systems reviewed and are negative except as documented in history of present illness above.   Past Medical History:  Diagnosis Date  . Anxiety   . 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 CAD (coronary artery disease)   . Carotid artery disease ([place])    CTA 08/2019 of the head and neck show moderate 65 to 75% bilateral ICA stenoses   . Chronic diastolic CHF (congestive heart failure) ([place])   . Diabetes mellitus type 2 in nonobese (HCC)   . Excessive urination at night 06/05/2013  . Giant cell arteritis ([place]) 05/17/2012  . Hyperlipidemia   . Hypertension   . Hypertensive urgency   . Hypomagnesemia 09/11/2019  . Mild anemia 09/11/2019  . NSTEMI (non-ST elevated myocardial infarction) ([place]) 09/09/2019  . Orthostatic hypotension 09/11/2019  . Premature atrial  contractions 09/12/2019  . Pulmonary nodules   . PVC (premature ventricular contraction)   . Renal mass   . 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 Burt Knack & Dr. Cyndia Bent  . Severe aortic stenosis   . Sinus bradycardia on ECG   . Sleep apnea   . 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 Wilburn Cornelia.  . Stroke ([place])   . TIA (transient ischemic attack)   . Urinary retention 09/11/2019    Past Surgical History:  Procedure Laterality Date  . CATARACT EXTRACTION, BILATERAL  10/2015, and 11/2015   with adding  TORIC lenses bilaterally   . INTRAVASCULAR PRESSURE WIRE/FFR STUDY N/A 07/07/2019   Procedure: INTRAVASCULAR PRESSURE WIRE/FFR STUDY;  Surgeon: [name], MD;  Location: [facility];  Service: Cardiovascular;  Laterality: N/A;  . LEFT HEART CATH AND CORONARY ANGIOGRAPHY N/A 07/07/2019   Procedure: LEFT HEART CATH AND CORONARY ANGIOGRAPHY;  Surgeon: [name], MD;  Location: [facility];  Service: Cardiovascular;  Laterality: N/A;  . SQUAMOUS CELL CARCINOMA EXCISION     tongue  . TEE WITHOUT CARDIOVERSION N/A 08/15/2019   Procedure: TRANSESOPHAGEAL ECHOCARDIOGRAM (TEE);  Surgeon: Sherren Mocha, MD;  Location: [place];  Service: Open Heart Surgery;  Laterality: N/A;  . TRANSCATHETER AORTIC VALVE REPLACEMENT, TRANSFEMORAL N/A 08/15/2019   Procedure: TRANSCATHETER AORTIC VALVE REPLACEMENT, TRANSFEMORAL using Edwards [employer] SAPIEN 3 Ultra 26 mm THV.;  Surgeon: Sherren Mocha, MD;  Location: [place];  Service: Open Heart Surgery;  Laterality: N/A;    Social History:  reports that he quit smoking about 44 years ago. He has never used smokeless tobacco. He reports that he does not drink alcohol and does not use drugs.  Allergies  Allergen Reactions  . Macrodantin [Nitrofurantoin] Other (See Comments)    "blocked kidneys"   . Tape Other (See Comments)    "Tears my skin and leaves marks." PLEASE USE COBAN!!  . Sulfa Antibiotics Rash  . Lisinopril Cough  . Nutritional Supplements Other (See Comments)    Protein drink given to me "did something"  . Penicillins Other (See Comments)    08/30/19- Patient can't remember, was 35+ years ago. OK with trying cephalexin in hospital    Family  History  Problem Relation Age of Onset  . Heart disease Mother   . Heart attack Mother   . Heart disease Father   . Heart attack Father      Prior to Admission medications   Medication Sig Start Date End Date Taking? Authorizing Provider  acetaminophen (TYLENOL) 325 MG tablet Take 650 mg by mouth every 6 (six) hours as needed for moderate pain or headache.   Yes [provider]  alum & mag hydroxide-simeth (MAALOX PLUS) 400-400-40 MG/5ML suspension Take 10 mLs by mouth every 4 (four) hours as needed for indigestion.   Yes [provider]  aspirin 81 MG chewable tablet Chew 1 tablet (81 mg total) by mouth daily. 10/03/19  Yes Hall, Carole N, DO  azithromycin (ZITHROMAX) 500 MG tablet Take 1 tablet (500 mg) one hour prior  To all dental visits. Patient taking differently: Take 500 mg by mouth See admin instructions. Take 1 tablet (500 mg) one hour prior  To all dental visits. 10/26/19  Yes Eileen Stanford, PA-C  carboxymethylcellulose (REFRESH PLUS) 0.5 % SOLN Place 1 drop into both eyes every 12 (twelve) hours  as needed (irritation).   Yes [provider]  clonazePAM (KLONOPIN) 0.5 MG tablet Take 1 tablet (0.5 mg total) by mouth 4 (four) times daily. Patient taking differently: Take 0.5 mg by mouth 2 (two) times daily.  10/03/19  Yes Irene Pap N, DO  Coenzyme Q10 200 MG capsule Take 200 mg by mouth daily.   Yes [provider]  FENOFIBRATE PO Take 135 mg by mouth daily.   Yes [provider]  finasteride (PROSCAR) 5 MG tablet Take 1 tablet (5 mg total) by mouth daily. 10/23/19 01/21/20 Yes [place] Eric J, DO  isosorbide mononitrate (IMDUR) 30 MG 24 hr tablet Take 1 tablet (30 mg total) by mouth daily. 10/03/19 12/01/19 Yes Hall, Carole N, DO  losartan-hydrochlorothiazide (HYZAAR) 100-25 MG tablet Take 1 tablet by mouth daily. 10/23/19 01/21/20 Yes [place] Eric J, DO  magnesium oxide (MAG-OX) 400 (241.3 Mg) MG tablet Take 1 tablet (400 mg total) by  mouth daily. 09/24/19  Yes Barrett, Evelene Croon, PA-C  metoprolol tartrate (LOPRESSOR) 50 MG tablet Take 1 tablet (50 mg total) by mouth 2 (two) times daily. 10/23/19 01/21/20 Yes [place] Eric J, DO  nitroGLYCERIN (NITROSTAT) 0.4 MG SL tablet Place 1 tablet (0.4 mg total) under the tongue every 5 (five) minutes as needed for chest pain (up to 3 doses). Do not give if blood pressure is low (less than 518 systolic). 10/23/19  Yes [place] Eric J, DO  nystatin (MYCOSTATIN/NYSTOP) powder Apply 1 application topically daily. groin   Yes [provider]  rosuvastatin (CRESTOR) 10 MG tablet Take 1 tablet (10 mg total) by mouth daily. 11/20/19 11/14/20 Yes Nahser, Wonda Cheng, MD  sitaGLIPtin (JANUVIA) 100 MG tablet Take 100 mg by mouth daily.   Yes [provider]  glucose blood test strip 1 each by Other route as needed.  05/12/12   [provider]  Lancets [facility] DELICA PLUS [id number]) [place] 1 each by Other route daily.  03/23/18   [provider]  ONE TOUCH ULTRA TEST test strip 1 each by Other route daily.  05/12/12   [provider]  polyethylene glycol (MIRALAX / GLYCOLAX) 17 g packet Take 17 g by mouth daily as needed for mild constipation. Patient not taking: Reported on 12/01/2019 09/23/19   Reola Mosher    Physical Exam: Vitals:   12/01/19 1800 12/01/19 1815 12/01/19 1930 12/01/19 1945  BP: (!) 180/67 (!) 204/71 (!) 164/65 (!) 157/60  Pulse: 65 65 63 70  Resp: 18 15 19  (!) 21  Temp:      TempSrc:      SpO2: 100% 99% 99% 97%  Weight:      Height:       Constitutional: Resting supine in bed, NAD, calm, comfortable Eyes: PERRL, lids and conjunctivae normal ENMT: Mucous membranes are moist. Posterior pharynx clear of any exudate or lesions.Normal dentition.  Neck: normal, supple, no masses. Respiratory: clear to auscultation bilaterally, no wheezing, no crackles. Normal respiratory effort. No accessory muscle use.  Cardiovascular: Regular rate  and rhythm, soft systolic murmur. No extremity edema. 2+ pedal pulses. Abdomen: no tenderness, no masses palpated. No hepatosplenomegaly. Bowel sounds positive.  Musculoskeletal: no clubbing / cyanosis. No joint deformity upper and lower extremities. Good ROM, no contractures. Normal muscle tone.  Skin: no rashes, lesions, ulcers. No induration Neurologic: CN 2-12 grossly intact. Sensation intact, Strength 5/5 in all 4.  Psychiatric: Normal judgment and insight. Alert and oriented x 3. Normal mood.   Labs on Admission:  I have personally reviewed following labs and imaging studies  CBC: Recent Labs  Lab 12/01/19 1225  WBC 9.4  HGB 14.1  HCT 42.4  MCV 79.7*  PLT 283   Basic Metabolic Panel: Recent Labs  Lab 12/01/19 1225 12/01/19 1420  NA 129*  --   K 4.2  --   CL 94*  --   CO2 23  --   GLUCOSE 103*  --   BUN 24*  --   CREATININE 1.23  --   CALCIUM 9.9  --   MG  --  1.8   GFR: Estimated Creatinine Clearance: 51.1 mL/min (by C-G formula based on SCr of 1.23 mg/dL). Liver Function Tests: No results for input(s): AST, ALT, ALKPHOS, BILITOT, PROT, ALBUMIN in the last 168 hours. No results for input(s): LIPASE, AMYLASE in the last 168 hours. No results for input(s): AMMONIA in the last 168 hours. Coagulation Profile: No results for input(s): INR, PROTIME in the last 168 hours. Cardiac Enzymes: No results for input(s): CKTOTAL, CKMB, CKMBINDEX, TROPONINI in the last 168 hours. BNP (last 3 results) No results for input(s): PROBNP in the last 8760 hours. HbA1C: No results for input(s): HGBA1C in the last 72 hours. CBG: No results for input(s): GLUCAP in the last 168 hours. Lipid Profile: No results for input(s): CHOL, HDL, LDLCALC, TRIG, CHOLHDL, LDLDIRECT in the last 72 hours. Thyroid Function Tests: No results for input(s): TSH, T4TOTAL, FREET4, T3FREE, THYROIDAB in the last 72 hours. Anemia Panel: No results for input(s): VITAMINB12, FOLATE, FERRITIN, TIBC, IRON,  RETICCTPCT in the last 72 hours. Urine analysis:    Component Value Date/Time   COLORURINE AMBER (A) 10/20/2019 1113   APPEARANCEUR CLOUDY (A) 10/20/2019 1113   LABSPEC 1.010 10/20/2019 1113   PHURINE 6.0 10/20/2019 1113   GLUCOSEU NEGATIVE 10/20/2019 1113   HGBUR MODERATE (A) 10/20/2019 1113   BILIRUBINUR NEGATIVE 10/20/2019 1113   KETONESUR NEGATIVE 10/20/2019 1113   PROTEINUR 30 (A) 10/20/2019 1113   NITRITE POSITIVE (A) 10/20/2019 1113   LEUKOCYTESUR LARGE (A) 10/20/2019 1113    Radiological Exams on Admission: DG Chest 2 View  Result Date: 12/01/2019 CLINICAL DATA:  Chest pain EXAM: CHEST - 2 VIEW COMPARISON:  10/20/2019 FINDINGS: Aortic valve replacement. The heart size and mediastinal contours are within normal limits. Atherosclerotic calcification of the aortic knob. Both lungs are clear. The visualized skeletal structures are unremarkable. IMPRESSION: No active cardiopulmonary disease. Electronically Signed   By: Davina Poke D.O.   On: 12/01/2019 12:55    EKG: Independently reviewed. Normal sinus rhythm without acute ischemic changes.  Not significantly changed when compared to prior.  Assessment/Plan Principal Problem:   Chest pain Active Problems:   Anxiety   Carotid artery disease (HCC)   CAD (coronary artery disease)   Chronic diastolic CHF (congestive heart failure) (HCC)   Diabetes mellitus type 2 in nonobese (HCC)   Sleep apnea   S/P TAVR (transcatheter aortic valve replacement)   Urinary retention   Hypertension associated with diabetes ([place])   Hyperlipidemia associated with type 2 diabetes mellitus (HCC)  Alexis Griffith is a 79 y.o. 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  Chest pain in history of CAD/NSTEMI: Patient with  initial noncardiac type chest pain however developed concerning unstable anginal type pain while in the ED.  Cardiology recommending admission for further chest pain rule out. -Follow repeat troponin -Monitor on telemetry, EKG as needed for recurrent chest pain -Patient restarted on Ranexa 500 mg twice daily per cardiology -Continue aspirin 81 mg daily -Continue Lopressor 50 mg twice daily -Continue Imdur 30 mg daily -Continue rosuvastatin 10 mg daily -Sublingual nitroglycerin as needed -Make n.p.o. after midnight  Chronic diastolic CHF: EF 09-47% by TTE 10/02/2019.  Chronic and stable, euvolemic on admission.  Not requiring diuretics as an outpatient.  Monitor I/O's and daily weights.  Severe AS s/p TAVR: S/p TAVR 08/15/2019 with multiple hospital admissions since.  TTE 10/02/2019 showed normal functioning AVR.  Hypertension: Intermittently hypertensive while in the ED.  May have some anxiety component. -Resume home Lopressor, losartan-HCTZ, Imdur  Type 2 diabetes: Last A1c 7.1% on 10/20/2019.  Hold home Januvia and placed on sensitive SSI while in hospital.  Hyperlipidemia: Continue rosuvastatin and fenofibrate.  Carotid artery stenosis: Carotid Doppler 08/17/2019 showed 40-59% right ICA stenosis and 1-39% left ICA stenosis. -Continue aspirin, statin  History of CVA: 4 mm acute infarct of the left medial frontal cortex noted on MRI brain 08/16/2019 felt to be periprocedural related at time of TAVR. -Continue aspirin and rosuvastatin  Urinary retention with chronic indwelling Foley catheter: Ongoing since TAVR.  Follows with urology, Dr. Tresa Moore.  Continue Foley care.  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 Tammi Klippel with reported plan for renal resection.  Anxiety: Reports previously on clonazepam 0.5 mg 4 times daily for nearly 20 years.  Had rapid taper to 1  tablet daily by his PCP in end of August which patient states he did not tolerate.  Had recent change in prescription to clonazepam 0.5 mg twice daily which he feels is managing his anxiety fairly well. -Continue clonazepam 0.5 mg twice daily -Consider addition of SSRI/SNRI  OSA: Not using CPAP due to intolerance.  Use supplemental O2 via [place] nightly if needed.  DVT prophylaxis: Lovenox Code Status: Partial/DNI, confirmed with patient Family Communication: Discussed with patient spouse at bedside Disposition Plan: From home and likely discharge to home pending chest pain rule out Consults called: Cardiology Admission status:  Status is: Observation  The patient remains OBS appropriate and will d/c before 2 midnights.  Dispo: The patient is from: Home              Anticipated d/c is to: Home              Anticipated d/c date is: 1 day              Patient currently is not medically stable to d/c.   Zada Finders MD [facility]  If 7PM-7AM, please contact night-coverage [email or web address]  12/01/2019, 9:15 PM

## 2019-12-01 NOTE — ED Notes (Signed)
Patient is resting comfortably, wife at bedside. Introduced self to patient and wife and updated on plan of care, they verbalized understanding of same. Foley bag emptied.

## 2019-12-02 DIAGNOSIS — F419 Anxiety disorder, unspecified: Secondary | ICD-10-CM | POA: Diagnosis not present

## 2019-12-02 DIAGNOSIS — R0789 Other chest pain: Secondary | ICD-10-CM | POA: Diagnosis not present

## 2019-12-02 DIAGNOSIS — I25118 Atherosclerotic heart disease of native coronary artery with other forms of angina pectoris: Secondary | ICD-10-CM | POA: Diagnosis not present

## 2019-12-02 DIAGNOSIS — R079 Chest pain, unspecified: Secondary | ICD-10-CM | POA: Diagnosis not present

## 2019-12-02 LAB — BASIC METABOLIC PANEL
Anion gap: 11 (ref 5–15)
BUN: 18 mg/dL (ref 8–23)
CO2: 25 mmol/L (ref 22–32)
Calcium: 9.8 mg/dL (ref 8.9–10.3)
Chloride: 96 mmol/L — ABNORMAL LOW (ref 98–111)
Creatinine, Ser: 1.04 mg/dL (ref 0.61–1.24)
GFR calc Af Amer: >60 mL/min (ref >60)
GFR calc non Af Amer: >60 mL/min (ref >60)
Glucose, Bld: 128 mg/dL — ABNORMAL HIGH (ref 70–99)
Potassium: 3.4 mmol/L — ABNORMAL LOW (ref 3.5–5.1)
Sodium: 132 mmol/L — ABNORMAL LOW (ref 135–145)

## 2019-12-02 LAB — CBC
HCT: 44.1 % (ref 39.0–52.0)
Hemoglobin: 14.7 g/dL (ref 13.0–17.0)
MCH: 26.8 pg (ref 26.0–34.0)
MCHC: 33.3 g/dL (ref 30.0–36.0)
MCV: 80.3 fL (ref 80.0–100.0)
Platelets: 163 10*3/uL (ref 150–400)
RBC: 5.49 MIL/uL (ref 4.22–5.81)
RDW: 13.4 % (ref 11.5–15.5)
WBC: 7.5 10*3/uL (ref 4.0–10.5)
nRBC: 0 % (ref 0.0–0.2)

## 2019-12-02 LAB — TROPONIN I (HIGH SENSITIVITY): Troponin I (High Sensitivity): 38 ng/L — ABNORMAL HIGH (ref <18)

## 2019-12-02 MED ORDER — RANOLAZINE ER 500 MG PO TB12: 500.0000 mg | ORAL_TABLET | Freq: Two times a day (BID) | ORAL | 1 refills | Status: DC

## 2019-12-02 MED ORDER — CHLORHEXIDINE GLUCONATE CLOTH 2 % EX PADS: 6.0000 | MEDICATED_PAD | Freq: Every day | CUTANEOUS | Status: DC

## 2019-12-02 NOTE — Progress Notes (Signed)
Progress Note  Patient Name: Alexis Griffith Date of Encounter: 12/02/2019  [facility] Cardiologist: Mertie Moores, MD   Subjective   Some chest pain very early AM now resolved. He reports significant anxiety in ER.    Inpatient Medications    Scheduled Meds: . aspirin  81 mg Oral Daily  . clonazePAM  0.5 mg Oral BID  . enoxaparin (LOVENOX) injection  40 mg Subcutaneous Q24H  . fenofibrate  160 mg Oral Daily  . finasteride  5 mg Oral Daily  . losartan  100 mg Oral Daily   And  . hydrochlorothiazide  25 mg Oral Daily  . insulin aspart  0-9 Units Subcutaneous TID WC  . isosorbide mononitrate  30 mg Oral Daily  . metoprolol tartrate  50 mg Oral BID  . ranolazine  500 mg Oral BID  . rosuvastatin  10 mg Oral Daily  . sodium chloride flush  3 mL Intravenous Q12H   Continuous Infusions:  PRN Meds: acetaminophen **OR** acetaminophen, ondansetron **OR** ondansetron (ZOFRAN) IV   Vital Signs    Vitals:   12/02/19 0130 12/02/19 0418 12/02/19 0539 12/02/19 0756  BP: (!) 118/51 (!) 178/63 (!) 180/67 (!) 174/62  Pulse: (!) 57 63  64  Resp: 15 18  17   Temp:    98.3 F (36.8 C)  TempSrc:    Oral  SpO2: 97% 98%  97%  Weight:      Height:        Intake/Output Summary (Last 24 hours) at 12/02/2019 0824 Last data filed at 12/01/2019 1900 Gross per 24 hour  Intake --  Output 2000 ml  Net -2000 ml   Last 3 Weights 12/01/2019 11/20/2019 10/26/2019  Weight (lbs) 180 lb 183 lb 3.2 oz 184 lb 6.4 oz  Weight (kg) 81.647 kg 83.099 kg 83.643 kg      Telemetry    SR - Personally Reviewed  ECG    n/a - Personally Reviewed  Physical Exam   GEN: No acute distress.   Neck: No JVD Cardiac: RRR, no murmurs, rubs, or gallops.  Respiratory: Clear to auscultation bilaterally. GI: Soft, nontender, non-distended  MS: No edema; No deformity. Neuro:  Nonfocal  Psych: Normal affect   Labs    High Sensitivity Troponin:   Recent Labs  Lab 12/01/19 1225 12/01/19 1420  12/01/19 1905 12/01/19 2303  TROPONINIHS 26* 26* 32* 38*      Chemistry Recent Labs  Lab 12/01/19 1225 12/02/19 0430  NA 129* 132*  K 4.2 3.4*  CL 94* 96*  CO2 23 25  GLUCOSE 103* 128*  BUN 24* 18  CREATININE 1.23 1.04  CALCIUM 9.9 9.8  GFRNONAA 56* >60  GFRAA >60 >60  ANIONGAP 12 11     Hematology Recent Labs  Lab 12/01/19 1225 12/02/19 0430  WBC 9.4 7.5  RBC 5.32 5.49  HGB 14.1 14.7  HCT 42.4 44.1  MCV 79.7* 80.3  MCH 26.5 26.8  MCHC 33.3 33.3  RDW 13.3 13.4  PLT 182 163    BNPNo results for input(s): BNP, PROBNP in the last 168 hours.   DDimer No results for input(s): DDIMER in the last 168 hours.   Radiology    DG Chest 2 View  Result Date: 12/01/2019 CLINICAL DATA:  Chest pain EXAM: CHEST - 2 VIEW COMPARISON:  10/20/2019 FINDINGS: Aortic valve replacement. The heart size and mediastinal contours are within normal limits. Atherosclerotic calcification of the aortic knob. Both lungs are clear. The visualized skeletal structures are  unremarkable. IMPRESSION: No active cardiopulmonary disease. Electronically Signed   By: Davina Poke D.O.   On: 12/01/2019 12:55    Cardiac Studies   Patient Profile     Alexis Griffith is a 79 y.o. 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 Phylliss Bob.  Assessment & Plan    1. CAD/chest pain - -LHC 07/2019 with three vessel CAD with a complete occlusion of the ostial right coronary artery with left-to-right collateral filling of the PDA and posterior lateral Ryelle Ruvalcaba. He has a long 70% proximal LAD stenosis and a 75% proximal circumflex stenosis. At the time also with severe AS, new diagnosis of renal cell carcinoma.   - from  cards notes discussed CABG/AVR, palliative treatment, TAVR and medical therapy for CAD elected for TAVR with medically managed CAD.  - admit 08/2019 with NSTEMI, managed medically due to hematuria which had previously caused plavix to have be stopped.    - presented with atypical chest pain lasting 1.5 hours, flat trops not consistent with ACS.  - mild flat trops, EKG no specifici ischemic changes chronic - antianginals have been limited by soft bp's at times. Just started ranexa last night 500mg  bid - no plans for ischemic testing, if pain free into afternoon would be ok for discharge.   2. Renal cell carcinoma  3. Aortic stenosis - s/p TAVR  4. Anxiety - severe, on benzos at home.   -Last echo with stable EF at 60% and normally functioning TAVR with a mean gradient at 69mmHg     For questions or updates, please contact [place] Please consult [email or web address] for contact info under        Signed, Carlyle Dolly, MD  12/02/2019, 8:24 AM

## 2019-12-02 NOTE — Progress Notes (Signed)
Denies pain, SOB or other c/o. Asking for d/c to home. Harlan Stains, PA notified. Okay to d/c home per cards. Dr Marthenia Rolling notified.

## 2019-12-02 NOTE — Plan of Care (Signed)
  Problem: Education: Goal: Knowledge of General Education information will improve Description: Including pain rating scale, medication(s)/side effects and non-pharmacologic comfort measures Outcome: Progressing   Problem: Clinical Measurements: Goal: Ability to maintain clinical measurements within normal limits will improve Outcome: Progressing Goal: Cardiovascular complication will be avoided Outcome: Progressing   

## 2019-12-02 NOTE — ED Notes (Signed)
Sudden onset CP rated 8/10. Radiating to LT arm. SL nitro given at this time.

## 2019-12-02 NOTE — ED Notes (Signed)
Tele  Breakfast Ordered 

## 2019-12-04 LAB — GLUCOSE, CAPILLARY: Glucose-Capillary: 129 mg/dL — ABNORMAL HIGH (ref 70–99)

## 2019-12-17 ENCOUNTER — Emergency Department (HOSPITAL_COMMUNITY): Payer: Medicare Other

## 2019-12-17 ENCOUNTER — Encounter (HOSPITAL_COMMUNITY): Payer: Self-pay | Admitting: Pharmacy Technician

## 2019-12-17 ENCOUNTER — Observation Stay (HOSPITAL_COMMUNITY)
Admission: EM | Admit: 2019-12-17 | Discharge: 2019-12-19 | Disposition: A | Discharge status: Home / Self Care | Payer: Medicare Other | Attending: Internal Medicine | Admitting: Internal Medicine

## 2019-12-17 ENCOUNTER — Other Ambulatory Visit: Payer: Self-pay

## 2019-12-17 DIAGNOSIS — I5032 Chronic diastolic (congestive) heart failure: Secondary | ICD-10-CM | POA: Diagnosis present

## 2019-12-17 DIAGNOSIS — I214 Non-ST elevation (NSTEMI) myocardial infarction: Secondary | ICD-10-CM | POA: Diagnosis not present

## 2019-12-17 DIAGNOSIS — Z8673 Personal history of transient ischemic attack (TIA), and cerebral infarction without residual deficits: Secondary | ICD-10-CM

## 2019-12-17 DIAGNOSIS — Z7982 Long term (current) use of aspirin: Secondary | ICD-10-CM | POA: Insufficient documentation

## 2019-12-17 DIAGNOSIS — I110 Hypertensive heart disease with heart failure: Secondary | ICD-10-CM | POA: Diagnosis not present

## 2019-12-17 DIAGNOSIS — I10 Essential (primary) hypertension: Secondary | ICD-10-CM

## 2019-12-17 DIAGNOSIS — E119 Type 2 diabetes mellitus without complications: Secondary | ICD-10-CM | POA: Diagnosis not present

## 2019-12-17 DIAGNOSIS — E785 Hyperlipidemia, unspecified: Secondary | ICD-10-CM | POA: Diagnosis present

## 2019-12-17 DIAGNOSIS — Z87891 Personal history of nicotine dependence: Secondary | ICD-10-CM | POA: Insufficient documentation

## 2019-12-17 DIAGNOSIS — N2889 Other specified disorders of kidney and ureter: Secondary | ICD-10-CM | POA: Diagnosis present

## 2019-12-17 DIAGNOSIS — I160 Hypertensive urgency: Secondary | ICD-10-CM | POA: Diagnosis not present

## 2019-12-17 DIAGNOSIS — G4733 Obstructive sleep apnea (adult) (pediatric): Secondary | ICD-10-CM

## 2019-12-17 DIAGNOSIS — R0989 Other specified symptoms and signs involving the circulatory and respiratory systems: Secondary | ICD-10-CM | POA: Diagnosis present

## 2019-12-17 DIAGNOSIS — Z79899 Other long term (current) drug therapy: Secondary | ICD-10-CM | POA: Diagnosis not present

## 2019-12-17 DIAGNOSIS — R079 Chest pain, unspecified: Principal | ICD-10-CM | POA: Diagnosis present

## 2019-12-17 DIAGNOSIS — I2510 Atherosclerotic heart disease of native coronary artery without angina pectoris: Secondary | ICD-10-CM | POA: Diagnosis not present

## 2019-12-17 DIAGNOSIS — I25118 Atherosclerotic heart disease of native coronary artery with other forms of angina pectoris: Secondary | ICD-10-CM | POA: Diagnosis not present

## 2019-12-17 DIAGNOSIS — C7800 Secondary malignant neoplasm of unspecified lung: Secondary | ICD-10-CM | POA: Diagnosis present

## 2019-12-17 DIAGNOSIS — Z952 Presence of prosthetic heart valve: Secondary | ICD-10-CM

## 2019-12-17 DIAGNOSIS — Z20822 Contact with and (suspected) exposure to covid-19: Secondary | ICD-10-CM | POA: Insufficient documentation

## 2019-12-17 DIAGNOSIS — C642 Malignant neoplasm of left kidney, except renal pelvis: Secondary | ICD-10-CM | POA: Diagnosis present

## 2019-12-17 LAB — BASIC METABOLIC PANEL
Anion gap: 13 (ref 5–15)
BUN: 25 mg/dL — ABNORMAL HIGH (ref 8–23)
CO2: 22 mmol/L (ref 22–32)
Calcium: 9.8 mg/dL (ref 8.9–10.3)
Chloride: 94 mmol/L — ABNORMAL LOW (ref 98–111)
Creatinine, Ser: 1.24 mg/dL (ref 0.61–1.24)
GFR, Estimated: 55 mL/min — ABNORMAL LOW (ref >60)
Glucose, Bld: 143 mg/dL — ABNORMAL HIGH (ref 70–99)
Potassium: 3.6 mmol/L (ref 3.5–5.1)
Sodium: 129 mmol/L — ABNORMAL LOW (ref 135–145)

## 2019-12-17 LAB — TROPONIN I (HIGH SENSITIVITY)
Troponin I (High Sensitivity): 21 ng/L — ABNORMAL HIGH (ref <18)
Troponin I (High Sensitivity): 44 ng/L — ABNORMAL HIGH (ref <18)

## 2019-12-17 LAB — CBC
HCT: 44 % (ref 39.0–52.0)
Hemoglobin: 14.4 g/dL (ref 13.0–17.0)
MCH: 26.7 pg (ref 26.0–34.0)
MCHC: 32.7 g/dL (ref 30.0–36.0)
MCV: 81.6 fL (ref 80.0–100.0)
Platelets: 142 10*3/uL — ABNORMAL LOW (ref 150–400)
RBC: 5.39 MIL/uL (ref 4.22–5.81)
RDW: 13.3 % (ref 11.5–15.5)
WBC: 7.7 10*3/uL (ref 4.0–10.5)
nRBC: 0 % (ref 0.0–0.2)

## 2019-12-17 MED ORDER — ROSUVASTATIN CALCIUM 5 MG PO TABS
10.0000 mg | ORAL_TABLET | Freq: Every day | ORAL | Status: DC
  Administered 2019-12-18 – 2019-12-19 (×2): 10 mg via ORAL
  Filled 2019-12-17 (×2): qty 2

## 2019-12-17 MED ORDER — LOSARTAN POTASSIUM-HCTZ 100-25 MG PO TABS: 1.0000 | ORAL_TABLET | Freq: Every day | ORAL | Status: DC

## 2019-12-17 MED ORDER — FINASTERIDE 5 MG PO TABS
5.0000 mg | ORAL_TABLET | Freq: Every day | ORAL | Status: DC
  Administered 2019-12-18: 5 mg via ORAL
  Filled 2019-12-17: qty 1

## 2019-12-17 MED ORDER — ASPIRIN 81 MG PO CHEW
81.0000 mg | CHEWABLE_TABLET | Freq: Every day | ORAL | Status: DC
  Administered 2019-12-18: 81 mg via ORAL
  Filled 2019-12-17 (×2): qty 1

## 2019-12-17 MED ORDER — ONDANSETRON HCL 4 MG/2ML IJ SOLN: 4.0000 mg | Freq: Four times a day (QID) | INTRAMUSCULAR | Status: DC | PRN

## 2019-12-17 MED ORDER — SODIUM CHLORIDE 0.9 % IV SOLN: INTRAVENOUS | Status: AC

## 2019-12-17 MED ORDER — RANOLAZINE ER 500 MG PO TB12
500.0000 mg | ORAL_TABLET | Freq: Two times a day (BID) | ORAL | Status: DC
  Administered 2019-12-18 – 2019-12-19 (×3): 500 mg via ORAL
  Filled 2019-12-17 (×5): qty 1

## 2019-12-17 MED ORDER — MORPHINE SULFATE (PF) 2 MG/ML IV SOLN
2.0000 mg | Freq: Once | INTRAVENOUS | Status: AC
  Administered 2019-12-17: 2 mg via INTRAVENOUS
  Filled 2019-12-17: qty 1

## 2019-12-17 MED ORDER — HEPARIN (PORCINE) 25000 UT/250ML-% IV SOLN
950.0000 [IU]/h | INTRAVENOUS | Status: DC
  Administered 2019-12-17: 950 [IU]/h via INTRAVENOUS
  Filled 2019-12-17: qty 250

## 2019-12-17 MED ORDER — HYDRALAZINE HCL 25 MG PO TABS
25.0000 mg | ORAL_TABLET | Freq: Three times a day (TID) | ORAL | Status: DC
  Administered 2019-12-17 – 2019-12-19 (×6): 25 mg via ORAL
  Filled 2019-12-17 (×6): qty 1

## 2019-12-17 MED ORDER — NITROGLYCERIN 0.4 MG SL SUBL
0.4000 mg | SUBLINGUAL_TABLET | SUBLINGUAL | Status: AC | PRN
  Administered 2019-12-17 – 2019-12-18 (×4): 0.4 mg via SUBLINGUAL
  Filled 2019-12-17 (×3): qty 1

## 2019-12-17 MED ORDER — ALUM & MAG HYDROXIDE-SIMETH 200-200-20 MG/5ML PO SUSP: 10.0000 mL | ORAL | Status: DC | PRN

## 2019-12-17 MED ORDER — POLYVINYL ALCOHOL 1.4 % OP SOLN: 1.0000 [drp] | Freq: Two times a day (BID) | OPHTHALMIC | Status: DC | PRN

## 2019-12-17 MED ORDER — INSULIN ASPART 100 UNIT/ML ~~LOC~~ SOLN
0.0000 [IU] | SUBCUTANEOUS | Status: DC
  Administered 2019-12-18 (×2): 2 [IU] via SUBCUTANEOUS
  Administered 2019-12-18 (×3): 3 [IU] via SUBCUTANEOUS
  Administered 2019-12-19: 2 [IU] via SUBCUTANEOUS
  Administered 2019-12-19: 3 [IU] via SUBCUTANEOUS

## 2019-12-17 MED ORDER — NITROGLYCERIN 2 % TD OINT
0.5000 [in_us] | TOPICAL_OINTMENT | Freq: Four times a day (QID) | TRANSDERMAL | Status: DC
  Administered 2019-12-18 (×2): 0.5 [in_us] via TOPICAL
  Filled 2019-12-17 (×2): qty 1

## 2019-12-17 MED ORDER — METOPROLOL TARTRATE 50 MG PO TABS
50.0000 mg | ORAL_TABLET | Freq: Two times a day (BID) | ORAL | Status: DC
  Administered 2019-12-18 – 2019-12-19 (×3): 50 mg via ORAL
  Filled 2019-12-17: qty 1
  Filled 2019-12-17 (×2): qty 2

## 2019-12-17 MED ORDER — ENOXAPARIN SODIUM 40 MG/0.4ML ~~LOC~~ SOLN: 40.0000 mg | SUBCUTANEOUS | Status: DC

## 2019-12-17 MED ORDER — HEPARIN BOLUS VIA INFUSION
4000.0000 [IU] | Freq: Once | INTRAVENOUS | Status: AC
  Administered 2019-12-17: 4000 [IU] via INTRAVENOUS
  Filled 2019-12-17: qty 4000

## 2019-12-17 MED ORDER — LOSARTAN POTASSIUM 50 MG PO TABS
100.0000 mg | ORAL_TABLET | Freq: Every day | ORAL | Status: DC
  Administered 2019-12-18 – 2019-12-19 (×2): 100 mg via ORAL
  Filled 2019-12-17 (×2): qty 2

## 2019-12-17 MED ORDER — HYDRALAZINE HCL 25 MG PO TABS: 25.0000 mg | ORAL_TABLET | Freq: Four times a day (QID) | ORAL | Status: DC | PRN

## 2019-12-17 MED ORDER — FENOFIBRATE 160 MG PO TABS
160.0000 mg | ORAL_TABLET | Freq: Every day | ORAL | Status: DC
  Administered 2019-12-19: 160 mg via ORAL
  Filled 2019-12-17: qty 1

## 2019-12-17 MED ORDER — CLONAZEPAM 0.5 MG PO TABS
0.5000 mg | ORAL_TABLET | Freq: Two times a day (BID) | ORAL | Status: DC
  Administered 2019-12-18 – 2019-12-19 (×4): 0.5 mg via ORAL
  Filled 2019-12-17 (×4): qty 1

## 2019-12-17 MED ORDER — ISOSORBIDE MONONITRATE ER 30 MG PO TB24: 30.0000 mg | ORAL_TABLET | Freq: Every day | ORAL | Status: DC

## 2019-12-17 MED ORDER — HYDROCHLOROTHIAZIDE 25 MG PO TABS
25.0000 mg | ORAL_TABLET | Freq: Every day | ORAL | Status: DC
  Administered 2019-12-18 – 2019-12-19 (×2): 25 mg via ORAL
  Filled 2019-12-17 (×2): qty 1

## 2019-12-17 MED ORDER — ACETAMINOPHEN 325 MG PO TABS: 650.0000 mg | ORAL_TABLET | Freq: Four times a day (QID) | ORAL | Status: DC | PRN

## 2019-12-17 NOTE — ED Notes (Signed)
Pt transported to xray 

## 2019-12-17 NOTE — ED Provider Notes (Signed)
[facility] EMERGENCY DEPARTMENT Provider Note   CSN: [id number] Arrival date & time:        History Chief Complaint  Patient presents with  . Hypertension  . Chest Pain    Alexis Griffith is a 79 y.o. male.  79 yo M with a chief complaint of chest pain.  Patient states that he felt generally unwell which only happens when his blood pressure is elevated he had a pain across his chest.  Checked his blood pressure and it was over 793 systolic.  Became concerned and so he called 911.  On their arrival reportedly he was even higher.  Received a dose of nitro in route with improvement of his pain.  Started at rest.  Had been able to exert himself earlier today without any significant change in his pain.  Does have some chest pain at all time this is left-sided that he was told is related to pulled pectoral muscle.  Now is about at his baseline.  He denies cough congestion or fever.  Denies trauma.  The history is provided by the patient.  Hypertension Associated symptoms include chest pain. Pertinent negatives include no abdominal pain, no headaches and no shortness of breath.  Chest Pain Pain location:  L chest Pain quality: aching   Pain radiates to:  Does not radiate Pain severity:  Moderate Onset quality:  Gradual Duration:  2 hours Timing:  Rare Progression:  Partially resolved Chronicity:  Recurrent Relieved by:  Nothing Worsened by:  Nothing Ineffective treatments:  None tried Associated symptoms: no abdominal pain, no fever, no headache, no palpitations, no shortness of breath and no vomiting        Past Medical History:  Diagnosis Date  . Anxiety   . 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 CAD (coronary artery disease)   . Carotid artery disease ([place])    CTA 08/2019 of the head and neck show moderate 65 to 75% bilateral ICA stenoses   . Chronic diastolic CHF (congestive heart failure) ([place])   . Diabetes mellitus type 2 in nonobese (HCC)   . Excessive  urination at night 06/05/2013  . 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 Hyperlipidemia   . Hypertension   . Hypertensive urgency   . Hypomagnesemia 09/11/2019  . Mild anemia 09/11/2019  . NSTEMI (non-ST elevated myocardial infarction) ([place]) 09/09/2019  . Orthostatic hypotension 09/11/2019  . Premature atrial contractions 09/12/2019  . Pulmonary nodules   . PVC (premature ventricular contraction)   . Renal mass   . 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 Burt Knack & Dr. Cyndia Bent  . Severe aortic stenosis   . Sinus bradycardia on ECG   . Sleep apnea   . 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 Wilburn Cornelia.  . Stroke ([place])   . TIA (transient ischemic attack)   . Urinary retention 09/11/2019    Patient Active Problem List   Diagnosis Date Noted  . Hypertension associated with diabetes ([place]) 12/01/2019  . Hyperlipidemia associated with type 2 diabetes mellitus ([place]) 12/01/2019  . UTI (urinary tract infection) due to urinary indwelling catheter ([place]) 10/20/2019  . UTI (urinary tract infection)   . Syncope   . Syncope and collapse 10/01/2019  . Abdominal pain   . CHF (congestive heart failure) ([place]) 09/25/2019  . Demand ischemia ([place]) 09/22/2019  . Premature atrial contractions 09/12/2019  . Urinary  retention 09/11/2019  . Mild anemia 09/11/2019  . Hypomagnesemia 09/11/2019  . NSTEMI (non-ST elevated myocardial infarction) ([place]) 09/09/2019  . Chest pain   . Hypertensive urgency   . Chronic diastolic CHF (congestive heart failure) ([place]) 08/15/2019  . 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 Diabetes mellitus type 2 in nonobese (HCC)   . Severe aortic stenosis   . Sleep apnea   . Renal mass   . CAD (coronary artery disease) 07/04/2019  . 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 Excessive urination at night 06/05/2013  . TIA (transient ischemic attack)  07/27/2012  . 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 Carotid artery disease ([place]) 08/24/2011  . PVC's (premature ventricular contractions)   . Labile hypertension   . Hyperlipidemia LDL goal <70   . Anxiety   . Sinus bradycardia on ECG     Past Surgical History:  Procedure Laterality Date  . CATARACT EXTRACTION, BILATERAL  10/2015, and 11/2015   with adding  TORIC lenses bilaterally   . INTRAVASCULAR PRESSURE WIRE/FFR STUDY N/A 07/07/2019   Procedure: INTRAVASCULAR PRESSURE WIRE/FFR STUDY;  Surgeon: [name], MD;  Location: [facility] CV [facility];  Service: Cardiovascular;  Laterality: N/A;  . LEFT HEART CATH AND CORONARY ANGIOGRAPHY N/A 07/07/2019   Procedure: LEFT HEART CATH AND CORONARY ANGIOGRAPHY;  Surgeon: [name], MD;  Location: [facility] CV [facility];  Service: Cardiovascular;  Laterality: N/A;  . SQUAMOUS CELL CARCINOMA EXCISION     tongue  . TEE WITHOUT CARDIOVERSION N/A 08/15/2019   Procedure: TRANSESOPHAGEAL ECHOCARDIOGRAM (TEE);  Surgeon: Sherren Mocha, MD;  Location: [facility];  Service: Open Heart Surgery;  Laterality: N/A;  . TRANSCATHETER AORTIC VALVE REPLACEMENT, TRANSFEMORAL N/A 08/15/2019   Procedure: TRANSCATHETER AORTIC VALVE REPLACEMENT, TRANSFEMORAL using Edwards [employer] SAPIEN 3 Ultra 26 mm THV.;  Surgeon: Sherren Mocha, MD;  Location: [facility];  Service: Open Heart Surgery;  Laterality: N/A;       Family History  Problem Relation Age of Onset  . Heart disease Mother   . Heart attack Mother   . Heart disease Father   . Heart attack Father     Social History   Tobacco Use  . Smoking status: Former Smoker    Quit date: 06/25/1975    Years since quitting: 44.5  . Smokeless tobacco: Never Used  Vaping Use  . Vaping Use: Never used  Substance Use Topics  . Alcohol use: No  . Drug use: No    Home Medications Prior to Admission medications   Medication Sig Start Date End Date Taking? Authorizing Provider  acetaminophen (TYLENOL) 325 MG tablet  Take 650 mg by mouth every 6 (six) hours as needed for moderate pain or headache.    [provider]  alum & mag hydroxide-simeth (MAALOX PLUS) 400-400-40 MG/5ML suspension Take 10 mLs by mouth every 4 (four) hours as needed for indigestion.    [provider]  aspirin 81 MG chewable tablet Chew 1 tablet (81 mg total) by mouth daily. 10/03/19   Kayleen Memos, DO  azithromycin (ZITHROMAX) 500 MG tablet Take 1 tablet (500 mg) one hour prior  To all dental visits. Patient taking differently: Take 500 mg by mouth See admin instructions. Take 1 tablet (500 mg) one hour prior  To all dental visits. 10/26/19   Eileen Stanford, PA-C  carboxymethylcellulose (REFRESH PLUS) 0.5 % SOLN Place 1 drop into both eyes every 12 (twelve) hours as needed (irritation).    [provider]  clonazePAM (KLONOPIN) 0.5 MG tablet Take 1 tablet (0.5 mg total) by mouth 4 (four) times daily. Patient taking differently: Take 0.5 mg by mouth 2 (two) times daily.  10/03/19   Kayleen Memos, DO  FENOFIBRATE PO Take 135 mg by mouth daily.    [provider]  finasteride (PROSCAR) 5 MG tablet Take 1 tablet (5 mg total) by mouth daily. 10/23/19 01/21/20  [place] Eric J, DO  glucose blood test strip 1 each by Other route as needed.  05/12/12   [provider]  isosorbide mononitrate (IMDUR) 30 MG 24 hr tablet Take 1 tablet (30 mg total) by mouth daily. 10/03/19 12/01/19  Kayleen Memos, DO  Lancets (ONETOUCH DELICA PLUS TFTDDU20U) MISC 1 each by Other route daily.  03/23/18   [provider]  losartan-hydrochlorothiazide (HYZAAR) 100-25 MG tablet Take 1 tablet by mouth daily. 10/23/19 01/21/20  [place] Donnamarie Poag, DO  magnesium oxide (MAG-OX) 400 (241.3 Mg) MG tablet Take 1 tablet (400 mg total) by mouth daily. 09/24/19   Barrett, Evelene Croon, PA-C  metoprolol tartrate (LOPRESSOR) 50 MG tablet Take 1 tablet (50 mg total) by mouth 2 (two) times daily. 10/23/19 01/21/20  [place] Donnamarie Poag, DO    nitroGLYCERIN (NITROSTAT) 0.4 MG SL tablet Place 1 tablet (0.4 mg total) under the tongue every 5 (five) minutes as needed for chest pain (up to 3 doses). Do not give if blood pressure is low (less than 542 systolic). 10/23/19   [place] Donnamarie Poag, DO  nystatin (MYCOSTATIN/NYSTOP) powder Apply 1 application topically daily. groin    [provider]  ONE TOUCH ULTRA TEST test strip 1 each by Other route daily.  05/12/12   [provider]  ranolazine (RANEXA) 500 MG 12 hr tablet Take 1 tablet (500 mg total) by mouth 2 (two) times daily. 12/02/19   Dana Allan I, MD  rosuvastatin (CRESTOR) 10 MG tablet Take 1 tablet (10 mg total) by mouth daily. 11/20/19 11/14/20  Nahser, Wonda Cheng, MD  sitaGLIPtin (JANUVIA) 100 MG tablet Take 100 mg by mouth daily.    [provider]    Allergies    Macrodantin [nitrofurantoin], Tape, Sulfa antibiotics, Lisinopril, Nutritional supplements, and Penicillins  Review of Systems   Review of Systems  Constitutional: Negative for chills and fever.  HENT: Negative for congestion and facial swelling.   Eyes: Negative for discharge and visual disturbance.  Respiratory: Negative for shortness of breath.   Cardiovascular: Positive for chest pain. Negative for palpitations.  Gastrointestinal: Negative for abdominal pain, diarrhea and vomiting.  Musculoskeletal: Negative for arthralgias and myalgias.  Skin: Negative for color change and rash.  Neurological: Negative for tremors, syncope and headaches.  Psychiatric/Behavioral: Negative for confusion and dysphoric mood.    Physical Exam Updated Vital Signs BP (!) 184/60   Pulse (!) 55   Temp 98.2 F (36.8 C) (Oral)   Resp 16   Ht 5\' 10"  (1.778 m)   Wt 78.7 kg   SpO2 99%   BMI 24.89 kg/m   Physical Exam Vitals and nursing note reviewed.  Constitutional:      Appearance: He is well-developed.  HENT:     Head: Normocephalic and atraumatic.  Eyes:     Pupils: Pupils are equal, round,  and reactive to light.  Neck:     Vascular: No JVD.  Cardiovascular:     Rate and Rhythm: Normal rate and regular rhythm.     Heart sounds: No murmur heard.  No friction rub. No  gallop.   Pulmonary:     Effort: No respiratory distress.     Breath sounds: No wheezing.  Chest:     Chest wall: No tenderness.  Abdominal:     General: There is no distension.     Tenderness: There is no guarding or rebound.  Musculoskeletal:        General: Normal range of motion.     Cervical back: Normal range of motion and neck supple.  Skin:    Coloration: Skin is not pale.     Findings: No rash.  Neurological:     Mental Status: He is alert and oriented to person, place, and time.  Psychiatric:        Behavior: Behavior normal.     ED Results / Procedures / Treatments   Labs (all labs ordered are listed, but only abnormal results are displayed) Labs Reviewed  BASIC METABOLIC PANEL - Abnormal; Notable for the following components:      Result Value   Sodium 129 (*)    Chloride 94 (*)    Glucose, Bld 143 (*)    BUN 25 (*)    GFR, Estimated 55 (*)    All other components within normal limits  CBC - Abnormal; Notable for the following components:   Platelets 142 (*)    All other components within normal limits  TROPONIN I (HIGH SENSITIVITY) - Abnormal; Notable for the following components:   Troponin I (High Sensitivity) 21 (*)    All other components within normal limits  TROPONIN I (HIGH SENSITIVITY) - Abnormal; Notable for the following components:   Troponin I (High Sensitivity) 44 (*)    All other components within normal limits  RESPIRATORY PANEL BY RT PCR (FLU A&B, COVID)    EKG EKG Interpretation  Date/Time:  Sunday December 17 2019 18:21:36 EDT Ventricular Rate:  64 PR Interval:    QRS Duration: 100 QT Interval:  407 QTC Calculation: 420 R Axis:   -26 Text Interpretation: Sinus rhythm Prolonged PR interval Probable left atrial enlargement Left ventricular hypertrophy  No significant change since last tracing Confirmed by Abir Eroh ([id number]) on 12/17/2019 7:48:26 PM   Radiology DG Chest 2 View  Result Date: 12/17/2019 CLINICAL DATA:  Chest pain on the left EXAM: CHEST - 2 VIEW COMPARISON:  12/01/2019 FINDINGS: The heart size and mediastinal contours are within normal limits. Both lungs are clear. The visualized skeletal structures are unremarkable. IMPRESSION: No active cardiopulmonary disease. Electronically Signed   By: Mark  Lukens M.D.   On: 12/17/2019 20:00    Procedures Procedures (including critical care time)  Medications Ordered in ED Medications  nitroGLYCERIN (NITROSTAT) SL tablet 0.4 mg (has no administration in time range)    ED Course  I have reviewed the triage vital signs and the nursing notes.  Pertinent labs & imaging results that were available during my care of the patient were reviewed by me and considered in my medical decision making (see chart for details).    MDM Rules/Calculators/A&P                          78  yo M with a chief complaint of chest pain.  This started about an hour and half prior to arrival here.  Has significantly proved by the time of his arrival.  Now is at about his baseline I felt like it is little bit worse now than it was on arrival.  He thinks this feels slightly  different than when he had had a heart attack previously.  Troponin is slightly below his baseline EKG with some slight changes from prior.  I discussed this with the cardiology fellow on-call who felt that the EKG the history and the initial troponin was not immediately concerning.  He recommended repeating a troponin.  Trop trending upward.  Discussed with cards, Dr. Vickki Muff, recommends hospitalist admission with cards consult.   CRITICAL CARE Performed by: Cecilio Asper   Total critical care time: 35 minutes  Critical care time was exclusive of separately billable procedures and treating other patients.  Critical care was  necessary to treat or prevent imminent or life-threatening deterioration.  Critical care was time spent personally by me on the following activities: development of treatment plan with patient and/or surrogate as well as nursing, discussions with consultants, evaluation of patient's response to treatment, examination of patient, obtaining history from patient or surrogate, ordering and performing treatments and interventions, ordering and review of laboratory studies, ordering and review of radiographic studies, pulse oximetry and re-evaluation of patient's condition.  The patients results and plan were reviewed and discussed.   Any x-rays performed were independently reviewed by myself.   Differential diagnosis were considered with the presenting HPI.  Medications  nitroGLYCERIN (NITROSTAT) SL tablet 0.4 mg (has no administration in time range)    Vitals:   12/17/19 1821 12/17/19 1822 12/17/19 1845 12/17/19 1900  BP:   (!) 186/66 (!) 184/60  Pulse:  66 (!) 58 (!) 55  Resp:  13 15 16   Temp:  98.2 F (36.8 C)    TempSrc:  Oral    SpO2:  99% 98% 99%  Weight: 78.7 kg     Height: 5\' 10"  (1.778 m)       Final diagnoses:  Chest pain with high risk for cardiac etiology    Admission/ observation were discussed with the admitting physician, patient and/or family and they are comfortable with the plan.     Final Clinical Impression(s) / ED Diagnoses Final diagnoses:  Chest pain with high risk for cardiac etiology    Rx / DC Orders ED Discharge Orders    None       Deno Etienne, DO 12/17/19 2151

## 2019-12-17 NOTE — H&P (Addendum)
History and Physical    Alexis Griffith OVZ:[id number] DOB: 1940-04-26 DOA: 12/17/2019  PCP: Haywood Pao, MD  Patient coming from: Home  Chief Complaint: Chest pain  HPI: CALBERT HULSEBUS is a 79 y.o. male with medical history significant of TIA/Stroke, tongue cancer s/p resection, OSA, AS s/p TAVR, renal mass (awaiting surgery), CAD/NSTEMI, HTN, HLD, GCA (treated), DM2 (A1C of 7.1 in August), HFpEF (TTE in August with grade 1 diastolic and normal EF), Carotid disease, urinary obstruction who presents with chest pain.  Mr. Widen states that he was in his normal state of health until about 3pm when he developed left sided chest pain while watching the [employer] game.  He notes that the pain came on suddenly at rest in the left side of his chest.  It was not associated with nausea, diaphoresis or SOB.  It did not radiate to the arm or jaw.  It was similar to pain he has had with an NSTEMI over the summer and so he became concerned.  He did not taken any medications at home.  He has not had any nausea, emesis, change in food, new medications to account for the change.  He called EMS and was provided a NTG and his pain improved.  Also noted that his BP was elevated and he is having a headache.  He has no LE swelling.  Pain is not reproducible.  While I was in there, he began having worsening chest pain again which was 8/10 on the pain scale and he was provided a nitroglycerin.    UPDATE: Reviewed cardiology note and will plan for IV heparin.  LHC in the AM and TTE.   ED Course: In the ED, He was noted to have a Na of 129, Glucose of 143, BUN of 25, Cr of 1.24, WBC of 7.7, H/H of 14 and 44.  TnI was initially 21 and on second check was 44.  CXR showed no acute findings.  EKG showed likely J Point elevation in the anterior leads, but appears very similar to EKG on 12/01/19.  Cardiology was consulted and will see him tonight.    Review of Systems: As per HPI otherwise all other systems reviewed and are  negative.  Past Medical History:  Diagnosis Date  . Anxiety   . 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 CAD (coronary artery disease)   . Carotid artery disease ([place])    CTA 08/2019 of the head and neck show moderate 65 to 75% bilateral ICA stenoses   . Chronic diastolic CHF (congestive heart failure) ([place])   . Diabetes mellitus type 2 in nonobese (HCC)   . Excessive urination at night 06/05/2013  . Giant cell arteritis ([place]) 05/17/2012  . Hyperlipidemia   . Hypertension   . Hypertensive urgency   . Hypomagnesemia 09/11/2019  . Mild anemia 09/11/2019  . NSTEMI (non-ST elevated myocardial infarction) ([place]) 09/09/2019  . Orthostatic hypotension 09/11/2019  . Premature atrial contractions 09/12/2019  . Pulmonary nodules   . PVC (premature ventricular contraction)   . Renal mass   . 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 Burt Knack & Dr. Cyndia Bent  . Severe aortic stenosis   . Sinus bradycardia on ECG   . Sleep apnea   . 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 Wilburn Cornelia.  . Stroke ([place])   . TIA (transient ischemic attack)   . Urinary  retention 09/11/2019    Past Surgical History:  Procedure Laterality Date  . CATARACT EXTRACTION, BILATERAL  10/2015, and 11/2015   with adding  TORIC lenses bilaterally   . INTRAVASCULAR PRESSURE WIRE/FFR STUDY N/A 07/07/2019   Procedure: INTRAVASCULAR PRESSURE WIRE/FFR STUDY;  Surgeon: [name], MD;  Location: [facility];  Service: Cardiovascular;  Laterality: N/A;  . LEFT HEART CATH AND CORONARY ANGIOGRAPHY N/A 07/07/2019   Procedure: LEFT HEART CATH AND CORONARY ANGIOGRAPHY;  Surgeon: [name], MD;  Location: [place];  Service: Cardiovascular;  Laterality: N/A;  . SQUAMOUS CELL CARCINOMA EXCISION     tongue  . TEE WITHOUT CARDIOVERSION N/A 08/15/2019   Procedure: TRANSESOPHAGEAL ECHOCARDIOGRAM (TEE);  Surgeon: Sherren Mocha, MD;  Location: [facility];  Service: Open Heart Surgery;  Laterality: N/A;  . TRANSCATHETER AORTIC VALVE REPLACEMENT, TRANSFEMORAL N/A 08/15/2019   Procedure: TRANSCATHETER AORTIC VALVE REPLACEMENT, TRANSFEMORAL using Edwards [employer] SAPIEN 3 Ultra 26 mm THV.;  Surgeon: Sherren Mocha, MD;  Location: [facility];  Service: Open Heart Surgery;  Laterality: N/A;    Social History  reports that he quit smoking about 44 years ago. He has never used smokeless tobacco. He reports that he does not drink alcohol and does not use drugs.  Allergies  Allergen Reactions  . Macrodantin [Nitrofurantoin] Other (See Comments)    "blocked kidneys"   . Tape Other (See Comments)    "Tears my skin and leaves marks." PLEASE USE COBAN!!  . Sulfa Antibiotics Rash  . Lisinopril Cough  . Nutritional Supplements Other (See Comments)    Protein drink given to me "did something"  . Penicillins Other (See Comments)    08/30/19- Patient can't remember, was 35+ years ago. OK with trying cephalexin in hospital    Family History  Problem Relation Age of Onset  . Heart disease Mother   . Heart attack Mother   . Heart disease Father   . Heart attack Father     Prior to Admission medications   Medication Sig Start Date End Date Taking? Authorizing Provider  acetaminophen (TYLENOL) 325 MG tablet Take 650 mg by mouth every 6 (six) hours as needed for moderate pain or headache.    [provider]  alum & mag hydroxide-simeth (MAALOX PLUS) 400-400-40 MG/5ML suspension Take 10 mLs by mouth every 4 (four) hours as needed for indigestion.    [provider]  aspirin 81 MG chewable tablet Chew 1 tablet (81 mg total) by mouth daily. 10/03/19   Kayleen Memos, DO  azithromycin (ZITHROMAX) 500 MG tablet Take 1 tablet (500 mg) one hour prior  To all dental visits. Patient taking differently: Take 500 mg by mouth See admin instructions. Take 1 tablet (500 mg) one hour prior  To all dental visits. 10/26/19    Eileen Stanford, PA-C  carboxymethylcellulose (REFRESH PLUS) 0.5 % SOLN Place 1 drop into both eyes every 12 (twelve) hours as needed (irritation).    [provider]  clonazePAM (KLONOPIN) 0.5 MG tablet Take 1 tablet (0.5 mg total) by mouth 4 (four) times daily. Patient taking differently: Take 0.5 mg by mouth 2 (two) times daily.  10/03/19   Kayleen Memos, DO  FENOFIBRATE PO Take 135 mg by mouth daily.    [provider]  finasteride (PROSCAR) 5 MG tablet Take 1 tablet (5 mg total) by mouth daily. 10/23/19 01/21/20  [place] Eric J, DO  glucose blood test strip 1 each by Other route as needed.  05/12/12   [provider]  isosorbide mononitrate (IMDUR) 30 MG 24 hr tablet Take 1 tablet (30 mg total) by mouth daily. 10/03/19 12/01/19  Kayleen Memos, DO  Lancets (ONETOUCH DELICA PLUS [id number]) MISC 1 each by Other route daily.  03/23/18   [provider]  losartan-hydrochlorothiazide (HYZAAR) 100-25 MG tablet Take 1 tablet by mouth daily. 10/23/19 01/21/20  [place] Donnamarie Poag, DO  magnesium oxide (MAG-OX) 400 (241.3 Mg) MG tablet Take 1 tablet (400 mg total) by mouth daily. 09/24/19   Barrett, Evelene Croon, PA-C  metoprolol tartrate (LOPRESSOR) 50 MG tablet Take 1 tablet (50 mg total) by mouth 2 (two) times daily. 10/23/19 01/21/20  [place] Donnamarie Poag, DO  nitroGLYCERIN (NITROSTAT) 0.4 MG SL tablet Place 1 tablet (0.4 mg total) under the tongue every 5 (five) minutes as needed for chest pain (up to 3 doses). Do not give if blood pressure is low (less than 630 systolic). 10/23/19   [place] Donnamarie Poag, DO  nystatin (MYCOSTATIN/NYSTOP) powder Apply 1 application topically daily. groin    [provider]  ONE TOUCH ULTRA TEST test strip 1 each by Other route daily.  05/12/12   [provider]  ranolazine (RANEXA) 500 MG 12 hr tablet Take 1 tablet (500 mg total) by mouth 2 (two) times daily. 12/02/19   Dana Allan I, MD  rosuvastatin (CRESTOR) 10 MG tablet Take 1  tablet (10 mg total) by mouth daily. 11/20/19 11/14/20  Nahser, Wonda Cheng, MD  sitaGLIPtin (JANUVIA) 100 MG tablet Take 100 mg by mouth daily.    [provider]    Physical Exam: Constitutional: NAD, calm, comfortable, noted the blood pressure cuff was too tight so he took it off.  Vitals:   12/17/19 1821 12/17/19 1822 12/17/19 1845 12/17/19 1900  BP:   (!) 186/66 (!) 184/60  Pulse:  66 (!) 58 (!) 55  Resp:  13 15 16   Temp:  98.2 F (36.8 C)    TempSrc:  Oral    SpO2:  99% 98% 99%  Weight: 78.7 kg     Height: 5\' 10"  (1.778 m)      Eyes: lids and conjunctivae normal ENMT: Mucous membranes are moist. .Normal dentition.  Neck: normal, supple Respiratory: CTAB, no wheezing or rales.  Normal chest wall excursion and WOB.  Cardiovascular: RR, NR, no murmur noted.  He has no pedal edema.  Intact pulses in the radial arteries bilaterally and in the PT arteries.  He has some discoloration to the left hand fingertips related to the blood pressure cuff, hand is warm.  CP is not reproducible.  Abdomen: Mild TTP in the LLQ, Normal bowel sounds, no distention.  Musculoskeletal: no clubbing / cyanosis.  Normal muscle tone for age.  Skin: no rashes, ulcers on exposed skin.  He has a healing biopsy site on the right arm which is covered by a bandaid.  Neurologic: Strength is grossly intact, he is moving independently in bed.  Psychiatric: Normal judgment and insight. Alert and oriented x 3. Normal mood.   Labs on Admission: I have personally reviewed following labs and imaging studies  CBC: Recent Labs  Lab 12/17/19 1850  WBC 7.7  HGB 14.4  HCT 44.0  MCV 81.6  PLT 142*    Basic Metabolic Panel: Recent Labs  Lab 12/17/19 1850  NA 129*  K 3.6  CL 94*  CO2 22  GLUCOSE 143*  BUN 25*  CREATININE 1.24  CALCIUM 9.8    GFR: Estimated Creatinine  Clearance: 50.7 mL/min (by C-G formula based on SCr of 1.24 mg/dL).  Liver Function Tests: No results for input(s): AST, ALT,  ALKPHOS, BILITOT, PROT, ALBUMIN in the last 168 hours.   Radiological Exams on Admission: DG Chest 2 View  Result Date: 12/17/2019 CLINICAL DATA:  Chest pain on the left EXAM: CHEST - 2 VIEW COMPARISON:  12/01/2019 FINDINGS: The heart size and mediastinal contours are within normal limits. Both lungs are clear. The visualized skeletal structures are unremarkable. IMPRESSION: No active cardiopulmonary disease. Electronically Signed   By: Inez Catalina M.D.   On: 12/17/2019 20:00    EKG: Independently reviewed. J point elevation in anterior leads, NSR.   Assessment/Plan Chest pain at rest CAD (coronary artery disease) - Currently with chest pain with typical and atypical features, started at rest, improved with nitroglycerin - LHC from May 2021: Severe 3VD with severe Aortic stenosis; recommendation at that time was to have 2 vessel PCI and TAVR vs. CABG AVR.  In June he had a TAVR with plan to treat his coronary disease medically.  During the workup for TAVR, he was found to have a renal mass and there are plans to have a unilateral nephrectomy (not yet done) - He is on Imdur 30 and Ranexa - these will be continued - Cardiology consulted - NPO at midnight, he may need a LHC for PCI - Trend troponin - Telemetry - Follow up cardiology recommendations - Aspirin - NTG prn for recurrence of pain - EKG in the AM - IVF with NS at 100cc/hr for 8 hours  UPDATE: Reviewed cardiology note and will plan for IV heparin.  LHC in the AM and TTE.    Labile hypertension - Restart home medications of losartan, hctz, metoprolol, imdur - Hydralazine PRN for SBP > 180    Hyperlipidemia LDL goal <70 - Continue crestor and fenofibrate    Chronic diastolic CHF (congestive heart failure) - Mild, no signs of volume issues - Monitor    Diabetes mellitus type 2 in nonobese  - Last A1C well controlled - Hold Januvia - Start SSI    Renal mass - Follow up outpatient     Chronic and Stable -  monitor S/P TAVR (transcatheter aortic valve replacement) H/O Stroke     DVT prophylaxis: Lovenox  Code Status:   Full - does not want to be on prolonged life support Disposition Plan:   Patient is from:  Home  Anticipated DC to:  Home  Anticipated DC date:  12/18/19, unless procedures planned  Anticipated DC barriers: Improvement of chest pain  Consults called:  Cardiology - Dr. Vickki Muff  Admission status:  Obs, Cardiac Tele  Severity of Illness: The appropriate patient status for this patient is OBSERVATION. Observation status is judged to be reasonable and necessary in order to provide the required intensity of service to ensure the patient's safety. The patient's presenting symptoms, physical exam findings, and initial radiographic and laboratory data in the context of their medical condition is felt to place them at decreased risk for further clinical deterioration. Furthermore, it is anticipated that the patient will be medically stable for discharge from the hospital within 2 midnights of admission. The following factors support the patient status of observation.   " The patient's presenting symptoms include chest pain. " The physical exam findings include elevated BP and chest pain. " The initial radiographic and laboratory data are Elevated troponin, trending.      Gilles Chiquito MD [facility]  How to contact  the [facility] Attending or Consulting provider [place] or covering provider during after hours [place], for this patient?   1. Check the care team in [facility] and look for a) attending/consulting [facility] provider listed and b) the [facility] team listed 2. Log into [email or web address] and use [place] universal password to access. If you do not have the password, please contact the hospital operator. 3. Locate the [facility] provider you are looking for under [facility] and page to a number that you can be directly reached. 4. If you still have difficulty reaching the provider, please  page the [facility] (Director on Call) for the Hospitalists listed on amion for assistance.  12/17/2019, 10:30 PM

## 2019-12-17 NOTE — Consult Note (Signed)
CARDIOLOGY CONSULT NOTE  Patient ID: Alexis Griffith MRN: [id number] DOB/AGE: [birth year] 79 y.o.  Admit date: 12/17/2019 Primary Cardiologist Dr. Acie Fredrickson Chief Complaint  Chest Pain Requesting  ED  HPI:   [name] is a 79 y.o. 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   [name] reports that he was in his usual state of health until approx 1500 on the day of presentation when he developed left sided chest pain while watching the [employer] game. He states that the pain came on all of a sudden and it was approximately 2-3/10 in severity initially, but has progressively worsened since then.  Given the similarity of his pain to prior episodes, he decided to call EMS and go to the ED for further evaluation. On EMS arrival, he received a SLNTG and his pain reportedly improved.  On EMS arrival, vital signs were notable for BP 161/60, HR 55, satting 98% on room air and afebrile. Initial ECG sinus rhythm with LVH and repolarization changes, but no dynamic ST changes.  Labs notable for Na 129, Glucose of 143, BUN of 25, Cr of 1.24, WBC of 7.7, H/H of 14 and 44.  TnI was initially 21 and on repeat was 44. CXR showed no acute findings.  Notably, [name] recently underwent TAVR 08/15/19, with prolonged hospital course due to issues withacute urinary retention,hematuria, AKI,UTI/epididymo-orchitisrequiring antibiotics,MRI with 50mm infarct felt to be incidental/procedural related,and orthostatic hypotension with recurrent syncoperequiring midodrine, Mestinon, and short-termFlorinef.He was discharged to SNF with an indwelling foley catheter and plans to follow-up with urology as an outpatient.   He has had repeated ED presentations with chest pain. On one such  presentation back in Jul 2021, his case was reviewed by [facility] and a conservative approach was undertaken given that if he were to have PCI, he would have to be on DAPT - had had recent gross hematuria requiring cessation of Plavix. Fortunately, he did tolerate 48 hours of heparin infusion. Anti-anginals/anti-hypertensives were up-titrated. His last LHC back in May 2021 showed severe three-vessel coronary disease with 100% CTO of the ostial RCA, along 70% proximal to mid LAD stenosis, and a 75% proximal-mid left circumflex. The decision was made for PCI +TAVR over CABG/AVR.      Past Medical History:  Diagnosis Date  . Anxiety   . 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 CAD (coronary artery disease)   . Carotid artery disease ([place])    CTA 08/2019 of the head and neck show moderate 65 to 75% bilateral ICA stenoses   . Chronic diastolic CHF (congestive heart failure) ([place])   . Diabetes mellitus type 2 in nonobese (HCC)   . Excessive urination at night 06/05/2013  . Giant cell arteritis ([place]) 05/17/2012  . Hyperlipidemia   . Hypertension   . Hypertensive urgency   . Hypomagnesemia 09/11/2019  . Mild anemia 09/11/2019  . NSTEMI (non-ST elevated myocardial infarction) ([place]) 09/09/2019  . Orthostatic hypotension 09/11/2019  . Premature atrial contractions 09/12/2019  . Pulmonary nodules   . PVC (premature ventricular contraction)   . Renal mass   . 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 Burt Knack & Dr. Cyndia Bent  . Severe aortic stenosis   .  Sinus bradycardia on ECG   . Sleep apnea   . 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 Wilburn Cornelia.  . Stroke ([place])   . TIA (transient ischemic attack)   . Urinary retention 09/11/2019    Past Surgical History:  Procedure Laterality Date  . CATARACT EXTRACTION, BILATERAL  10/2015, and 11/2015   with adding  TORIC lenses bilaterally   .  INTRAVASCULAR PRESSURE WIRE/FFR STUDY N/A 07/07/2019   Procedure: INTRAVASCULAR PRESSURE WIRE/FFR STUDY;  Surgeon: [name], MD;  Location: [facility];  Service: Cardiovascular;  Laterality: N/A;  . LEFT HEART CATH AND CORONARY ANGIOGRAPHY N/A 07/07/2019   Procedure: LEFT HEART CATH AND CORONARY ANGIOGRAPHY;  Surgeon: [name], MD;  Location: [facility];  Service: Cardiovascular;  Laterality: N/A;  . SQUAMOUS CELL CARCINOMA EXCISION     tongue  . TEE WITHOUT CARDIOVERSION N/A 08/15/2019   Procedure: TRANSESOPHAGEAL ECHOCARDIOGRAM (TEE);  Surgeon: Sherren Mocha, MD;  Location: [place];  Service: Open Heart Surgery;  Laterality: N/A;  . TRANSCATHETER AORTIC VALVE REPLACEMENT, TRANSFEMORAL N/A 08/15/2019   Procedure: TRANSCATHETER AORTIC VALVE REPLACEMENT, TRANSFEMORAL using Edwards [employer] SAPIEN 3 Ultra 26 mm THV.;  Surgeon: Sherren Mocha, MD;  Location: [place];  Service: Open Heart Surgery;  Laterality: N/A;    Allergies  Allergen Reactions  . Macrodantin [Nitrofurantoin] Other (See Comments)    "blocked kidneys"   . Tape Other (See Comments)    "Tears my skin and leaves marks." PLEASE USE COBAN!!  . Sulfa Antibiotics Rash  . Lisinopril Cough  . Nutritional Supplements Other (See Comments)    Protein drink given to me "did something"  . Penicillins Other (See Comments)    08/30/19- Patient can't remember, was 35+ years ago. OK with trying cephalexin in hospital   (Not in a hospital admission)  Family History  Problem Relation Age of Onset  . Heart disease Mother   . Heart attack Mother   . Heart disease Father   . Heart attack Father     Social History   Socioeconomic History  . Marital status: Married    Spouse name: Not on file  . Number of children: 2  . Years of education: Not on file  . Highest education level: Not on file  Occupational History  . Not on file  Tobacco Use  . Smoking status: Former Smoker    Quit date: 06/25/1975    Years  since quitting: 44.5  . Smokeless tobacco: Never Used  Vaping Use  . Vaping Use: Never used  Substance and Sexual Activity  . Alcohol use: No  . Drug use: No  . Sexual activity: Yes  Other Topics Concern  . Not on file  Social History Narrative   Patient lives at home with his wife.    Social Determinants of Health   Financial Resource Strain:   . Difficulty of Paying Living Expenses: Not on file  Food Insecurity:   . Worried About Charity fundraiser in the Last Year: Not on file  . Ran Out of Food in the Last Year: Not on file  Transportation Needs:   . Lack of Transportation (Medical): Not on file  . Lack of Transportation (Non-Medical): Not on file  Physical Activity:   . Days of Exercise per Week: Not on file  . Minutes of Exercise per Session: Not on file  Stress:   . Feeling of Stress : Not on file  Social  Connections:   . Frequency of Communication with Friends and Family: Not on file  . Frequency of Social Gatherings with Friends and Family: Not on file  . Attends Religious Services: Not on file  . Active Member of Clubs or Organizations: Not on file  . Attends Archivist Meetings: Not on file  . Marital Status: Not on file  Intimate Partner Violence:   . Fear of Current or Ex-Partner: Not on file  . Emotionally Abused: Not on file  . Physically Abused: Not on file  . Sexually Abused: Not on file     Review of Systems: [y] = yes, [ ]  = no       General: Weight gain [] ; Weight loss [ ] ; Anorexia [ ] ; Fatigue [ ] ; Fever [ ] ; Chills [ ] ; Weakness [ ]     Cardiac: As reported in HPI   pulmonary: Cough [ ] ; Wheezing[ ] ; Hemoptysis[ ] ; Sputum [ ] ; Snoring [ ]     GI: Vomiting[ ] ; Dysphagia[ ] ; Melena[ ] ; Hematochezia [ ] ; Heartburn[ ] ; Abdominal pain [ ] ; Constipation [ ] ; Diarrhea [ ] ; BRBPR [ ]     GU: Hematuria[ ] ; Dysuria [ ] ; Nocturia[ ]   Vascular: Pain in legs with walking [ ] ; Pain in feet with lying flat [ ] ; Non-healing sores [ ] ; Stroke [ ] ;  TIA [ ] ; Slurred speech [ ] ;    Neuro: Headaches[ ] ; Vertigo[ ] ; Seizures[ ] ; Paresthesias[ ] ;Blurred vision [ ] ; Diplopia [ ] ; Vision changes [ ]     Ortho/Skin: Arthritis [ ] ; Joint pain [ ] ; Muscle pain [ ] ; Joint swelling [ ] ; Back Pain [ ] ; Rash [ ]     Psych: Depression[ ] ; Anxiety[ ]     Heme: Bleeding problems [ ] ; Clotting disorders [ ] ; Anemia [ ]     Endocrine: Diabetes [ ] ; Thyroid dysfunction[ ]   Physical Exam: Blood pressure (!) 184/60, pulse (!) 55, temperature 98.2 F (36.8 C), temperature source Oral, resp. rate 16, height 5\' 10"  (1.778 m), weight 78.7 kg, SpO2 99 %.   GENERAL: Patient is afebrile, Vital signs reviewed, Well appearing, Patient appears mildly uncomfortable, Alert and lucid. EYES: Normal inspection. HEENT:  normocephalic, atraumatic , normal ENT inspection. ORAL:  Moist NECK:  supple , normal inspection. CARD:  regular rate and rhythm, heart sounds normal. RESP:  no respiratory distress, breath sounds normal. ABD: soft, mildly tender to palpation in the bilateral lower quadrants\ MUSC:  normal ROM, non-tender , no pedal edema . SKIN: color normal, no rash, warm, dry . NEURO: awake & alert, lucid, no motor/sensory deficit. Gait stable. PSYCH: mood/affect normal.  Labs: Lab Results  Component Value Date   BUN 25 (H) 12/17/2019   Lab Results  Component Value Date   CREATININE 1.24 12/17/2019   Lab Results  Component Value Date   NA 129 (L) 12/17/2019   K 3.6 12/17/2019   CL 94 (L) 12/17/2019   CO2 22 12/17/2019   Lab Results  Component Value Date   TROPONINI <0.30 12/30/2011   Lab Results  Component Value Date   WBC 7.7 12/17/2019   HGB 14.4 12/17/2019   HCT 44.0 12/17/2019   MCV 81.6 12/17/2019   PLT 142 (L) 12/17/2019   Lab Results  Component Value Date   CHOL 130 09/09/2019   HDL 40 (L) 09/09/2019   LDLCALC 76 09/09/2019   TRIG 68 09/09/2019   CHOLHDL 3.3 09/09/2019   Lab Results  Component Value Date   ALT 15 10/21/2019  AST 16 10/21/2019   ALKPHOS 32 (L) 10/21/2019   BILITOT 0.5 10/21/2019      ASSESSMENT AND PLAN:    Alexis Griffith is a 79 y.o. 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  1. NSTEMI Chest pain/elevated troponin CAD HLD  in the context of known multivessel CAD. Previously managed medically due to complex ongoing issue with renal mass and hematuria. Patient still experieincing some chest discomfort. Continued hypertension also noted which may be contributing.  - Plan for diagnostic LHC in AM after full discussion with cardiology team about risks and benefits - Patient to be NPO except for meds - Start heparin IV infusion protocol - Symptom management with Nitro (using Patch for now, given lack of IV access) - Trend trop and serial ECGs - TTE in AM to assess for any wall motion abnormalities - Aggressive risk factor modification with continuation of his home regimen GDMT and antianginals  - continue daily aspirin 81mg  daily   -Continue home rosuvastatin   2. HTN  - Continue home antihypertensive regimen (except for Imdur while he has a nitro patch in place) - Added on PO Hydral for his acute hypertension  - Blood pressures may also improve with treatment of his pain  3. Severe AS s/p recent TAVR 08/15/19  -Continue to clinically monitor  4. Renal cell carcinoma with recent urinary retention requiring foley, hematuria and UTI/epididymoorchitis (completed course of fosfamycin last admission) 5. DM type 2 6. Carotid artery disease  -Management per admitting medicine team    Signed: Clois Dupes 12/17/2019, 10:14 PM

## 2019-12-17 NOTE — ED Triage Notes (Signed)
Pt bib ems from home with sudden onset L sided chest tightness, increased weakness X 1.5 hours. Pt with heart valve replacement, CVA in the past. Denies shob, emesis. Given 324mg  ASA and 1 SL nitro with improvement in symptoms. 230/100- 172/76 HR 60  CBG 151 RR 16 98% RA 97.12F

## 2019-12-17 NOTE — Progress Notes (Signed)
ANTICOAGULATION CONSULT NOTE - Initial Consult  Pharmacy Consult for heparin Indication: chest pain/ACS  Allergies  Allergen Reactions  . Macrodantin [Nitrofurantoin] Other (See Comments)    "blocked kidneys"   . Tape Other (See Comments)    "Tears my skin and leaves marks." PLEASE USE COBAN!!  . Sulfa Antibiotics Rash  . Lisinopril Cough  . Nutritional Supplements Other (See Comments)    Protein drink given to me "did something"  . Penicillins Other (See Comments)    08/30/19- Patient can't remember, was 35+ years ago. OK with trying cephalexin in hospital    Patient Measurements: Height: 5\' 10"  (177.8 cm) Weight: 78.7 kg (173 lb 8 oz) IBW/kg (Calculated) : 73 Heparin Dosing Weight: 78.7 kg  Vital Signs: Temp: 98.2 F (36.8 C) (10/17 1822) Temp Source: Oral (10/17 1822) BP: 184/60 (10/17 1900) Pulse Rate: 55 (10/17 1900)  Labs: Recent Labs    12/17/19 1850 12/17/19 2041  HGB 14.4  --   HCT 44.0  --   PLT 142*  --   CREATININE 1.24  --   TROPONINIHS 21* 44*    Estimated Creatinine Clearance: 50.7 mL/min (by C-G formula based on SCr of 1.24 mg/dL).   Medical History: Past Medical History:  Diagnosis Date  . Anxiety   . 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 CAD (coronary artery disease)   . Carotid artery disease ([place])    CTA 08/2019 of the head and neck show moderate 65 to 75% bilateral ICA stenoses   . Chronic diastolic CHF (congestive heart failure) ([place])   . Diabetes mellitus type 2 in nonobese (HCC)   . Excessive urination at night 06/05/2013  . Giant cell arteritis ([place]) 05/17/2012  . Hyperlipidemia   . Hypertension   . Hypertensive urgency   . Hypomagnesemia 09/11/2019  . Mild anemia 09/11/2019  . NSTEMI (non-ST elevated myocardial infarction) ([place]) 09/09/2019  . Orthostatic hypotension 09/11/2019  . Premature atrial contractions 09/12/2019  . Pulmonary nodules   . PVC (premature ventricular contraction)   . Renal mass   . S/P TAVR (transcatheter aortic  valve replacement) 08/15/2019   s/p TAVR with 26 mm Edwards S3U via TF approach by Dr. Burt Knack & Dr. Cyndia Bent  . Severe aortic stenosis   . Sinus bradycardia on ECG   . Sleep apnea   . 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 Wilburn Cornelia.  . Stroke ([place])   . TIA (transient ischemic attack)   . Urinary retention 09/11/2019    Medications:  See medication history  Assessment: 79 yo man to start heparin for CP.  He was not on anticoagulation PTA.  Hg 14.4, PTLC 142 Goal of Therapy:  Heparin level 0.3-0.7 units/ml Monitor platelets by anticoagulation protocol: Yes   Plan:  Heparin 4000 unit bolus and drip at 950 units/hr Check heparin level 6-8 hours after start Daily HL and CBC while on heparin Monitor for bleeding complications  Thanks for allowing pharmacy to be a part of this patient's care.  Excell Seltzer, PharmD Clinical Pharmacist 12/17/2019,10:39 PM

## 2019-12-18 DIAGNOSIS — E785 Hyperlipidemia, unspecified: Secondary | ICD-10-CM | POA: Diagnosis not present

## 2019-12-18 DIAGNOSIS — I160 Hypertensive urgency: Secondary | ICD-10-CM | POA: Diagnosis not present

## 2019-12-18 DIAGNOSIS — R079 Chest pain, unspecified: Secondary | ICD-10-CM

## 2019-12-18 DIAGNOSIS — I25118 Atherosclerotic heart disease of native coronary artery with other forms of angina pectoris: Secondary | ICD-10-CM

## 2019-12-18 DIAGNOSIS — E119 Type 2 diabetes mellitus without complications: Secondary | ICD-10-CM

## 2019-12-18 DIAGNOSIS — I5032 Chronic diastolic (congestive) heart failure: Secondary | ICD-10-CM

## 2019-12-18 LAB — BASIC METABOLIC PANEL
Anion gap: 11 (ref 5–15)
BUN: 20 mg/dL (ref 8–23)
CO2: 24 mmol/L (ref 22–32)
Calcium: 9.6 mg/dL (ref 8.9–10.3)
Chloride: 96 mmol/L — ABNORMAL LOW (ref 98–111)
Creatinine, Ser: 1.16 mg/dL (ref 0.61–1.24)
GFR, Estimated: 60 mL/min — ABNORMAL LOW (ref >60)
Glucose, Bld: 214 mg/dL — ABNORMAL HIGH (ref 70–99)
Potassium: 3.5 mmol/L (ref 3.5–5.1)
Sodium: 131 mmol/L — ABNORMAL LOW (ref 135–145)

## 2019-12-18 LAB — CBC
HCT: 43.6 % (ref 39.0–52.0)
Hemoglobin: 14.6 g/dL (ref 13.0–17.0)
MCH: 27 pg (ref 26.0–34.0)
MCHC: 33.5 g/dL (ref 30.0–36.0)
MCV: 80.6 fL (ref 80.0–100.0)
Platelets: 153 10*3/uL (ref 150–400)
RBC: 5.41 MIL/uL (ref 4.22–5.81)
RDW: 13.2 % (ref 11.5–15.5)
WBC: 8.6 10*3/uL (ref 4.0–10.5)
nRBC: 0 % (ref 0.0–0.2)

## 2019-12-18 LAB — CBG MONITORING, ED
Glucose-Capillary: 100 mg/dL — ABNORMAL HIGH (ref 70–99)
Glucose-Capillary: 129 mg/dL — ABNORMAL HIGH (ref 70–99)
Glucose-Capillary: 162 mg/dL — ABNORMAL HIGH (ref 70–99)
Glucose-Capillary: 183 mg/dL — ABNORMAL HIGH (ref 70–99)
Glucose-Capillary: 191 mg/dL — ABNORMAL HIGH (ref 70–99)
Glucose-Capillary: 70 mg/dL (ref 70–99)
Glucose-Capillary: 75 mg/dL (ref 70–99)

## 2019-12-18 LAB — TROPONIN I (HIGH SENSITIVITY)
Troponin I (High Sensitivity): 69 ng/L — ABNORMAL HIGH (ref <18)
Troponin I (High Sensitivity): 63 ng/L — ABNORMAL HIGH (ref <18)

## 2019-12-18 LAB — CREATININE, SERUM
Creatinine, Ser: 1.15 mg/dL (ref 0.61–1.24)
GFR, Estimated: >60 mL/min (ref >60)

## 2019-12-18 LAB — RESPIRATORY PANEL BY RT PCR (FLU A&B, COVID)
Influenza A by PCR: NEGATIVE
Influenza B by PCR: NEGATIVE
SARS Coronavirus 2 by RT PCR: NEGATIVE

## 2019-12-18 LAB — HEPARIN LEVEL (UNFRACTIONATED): Heparin Unfractionated: 0.2 IU/mL — ABNORMAL LOW (ref 0.30–0.70)

## 2019-12-18 MED ORDER — ISOSORBIDE MONONITRATE ER 30 MG PO TB24: 60.0000 mg | ORAL_TABLET | Freq: Every day | ORAL | Status: DC

## 2019-12-18 MED ORDER — ISOSORBIDE MONONITRATE ER 30 MG PO TB24
30.0000 mg | ORAL_TABLET | Freq: Every day | ORAL | Status: DC
  Administered 2019-12-19: 30 mg via ORAL
  Filled 2019-12-18: qty 1

## 2019-12-18 MED ORDER — CHLORHEXIDINE GLUCONATE CLOTH 2 % EX PADS
6.0000 | MEDICATED_PAD | Freq: Every day | CUTANEOUS | Status: DC
  Administered 2019-12-19: 6 via TOPICAL

## 2019-12-18 MED ORDER — HYDRALAZINE HCL 25 MG PO TABS
25.0000 mg | ORAL_TABLET | Freq: Three times a day (TID) | ORAL | 0 refills | Status: DC
Start: 2019-12-18 — End: 2019-12-19

## 2019-12-18 NOTE — Progress Notes (Deleted)
ANTICOAGULATION CONSULT NOTE - Initial Consult  Pharmacy Consult for heparin Indication: chest pain/ACS  Allergies  Allergen Reactions  . Macrodantin [Nitrofurantoin] Other (See Comments)    "blocked kidneys"   . Tape Other (See Comments)    "Tears my skin and leaves marks." PLEASE USE COBAN!!  . Sulfa Antibiotics Rash  . Lisinopril Cough  . Nutritional Supplements Other (See Comments)    Protein drink given to me "did something"  . Penicillins Other (See Comments)    08/30/19- Patient can't remember, was 35+ years ago. OK with trying cephalexin in hospital    Patient Measurements: Height: 5\' 10"  (177.8 cm) Weight: 78.7 kg (173 lb 8 oz) IBW/kg (Calculated) : 73 Heparin Dosing Weight: 78.7 kg  Vital Signs: BP: 138/62 (10/18 0930) Pulse Rate: 61 (10/18 0930)  Labs: Recent Labs    12/17/19 1850 12/17/19 1850 12/17/19 2041 12/18/19 0014 12/18/19 0144 12/18/19 0820  HGB 14.4  --   --  14.6  --   --   HCT 44.0  --   --  43.6  --   --   PLT 142*  --   --  153  --   --   HEPARINUNFRC  --   --   --   --   --  0.20*  CREATININE 1.24  --   --  1.15 1.16  --   TROPONINIHS 21*   < > 44* 69* 63*  --    < > = values in this interval not displayed.    Estimated Creatinine Clearance: 54.2 mL/min (by C-G formula based on SCr of 1.16 mg/dL).   Medical History: Past Medical History:  Diagnosis Date  . Anxiety   . 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 CAD (coronary artery disease)   . Carotid artery disease ([place])    CTA 08/2019 of the head and neck show moderate 65 to 75% bilateral ICA stenoses   . Chronic diastolic CHF (congestive heart failure) ([place])   . Diabetes mellitus type 2 in nonobese (HCC)   . Excessive urination at night 06/05/2013  . Giant cell arteritis ([place]) 05/17/2012  . Hyperlipidemia   . Hypertension   . Hypertensive urgency   . Hypomagnesemia 09/11/2019  . Mild anemia 09/11/2019  . NSTEMI (non-ST elevated myocardial infarction) ([place]) 09/09/2019  . Orthostatic  hypotension 09/11/2019  . Premature atrial contractions 09/12/2019  . Pulmonary nodules   . PVC (premature ventricular contraction)   . Renal mass   . 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 Burt Knack & Dr. Cyndia Bent  . Severe aortic stenosis   . Sinus bradycardia on ECG   . Sleep apnea   . 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 Wilburn Cornelia.  . Stroke ([place])   . TIA (transient ischemic attack)   . Urinary retention 09/11/2019    Medications:  See medication history  Assessment: 79 yo man to start heparin for CP.  He was not on anticoagulation PTA.  Hg 14.4, PTLC 142  Heparin level 0.2  Goal of Therapy:  Heparin level 0.3-0.7 units/ml Monitor platelets by anticoagulation protocol: Yes   Plan:  Increase Heparin drip to 1100 units/hr Check heparin level 6-8 hours after start Daily HL and CBC while on heparin Monitor for bleeding complications  Thanks for allowing pharmacy to be a part of this patient's care.  Alanda Slim, PharmD, [facility] Clinical Pharmacist Please see  AMION for all Pharmacists' Contact Phone Numbers 12/18/2019, 10:03 AM

## 2019-12-18 NOTE — Progress Notes (Signed)
PT Cancellation Note  Patient Details Name: Alexis Griffith MRN: [id number] DOB: December 24, 1940   Cancelled Treatment:    Reason Eval/Treat Not Completed: Medical issues which prohibited therapy   PT imminent d/c orders received and chart reviewed. Noted pt in ED with chest pain and has received cardiology consult.  Pt w/ d/c orders.  Upon arrival, RN reports pt reports chest pain has returned and request to hold PT.  PT notified RN imminent d/c orders and PT would not be able to return until tomorrow.  RN checked with MD via secure chat and confirmed hold PT today.  Will f/u as able.  Abran Richard, PT Acute Rehab Services Pager [phone] [address] [facility] [place] 12/18/2019, 5:10 PM

## 2019-12-18 NOTE — ED Notes (Signed)
Checked patient cbg it was 100 notified RN of blood sugar patient is resting with call bell in reach

## 2019-12-18 NOTE — ED Notes (Signed)
Lunch Tray Ordered @ 1050. 

## 2019-12-18 NOTE — Progress Notes (Addendum)
The patient has been seen in conjunction with Vin Bhagat, PAC. All aspects of care have been considered and discussed. The patient has been personally interviewed, examined, and all clinical data has been reviewed.   He has been juggling his medications at home.  Did not take losartan or metoprolol yesterday morning.  While watching the football game he started having a headache when the Panthers fell behind after seemingly being in position to when.  He began measuring his blood pressures and noted that they were up.  He became anxious.  This was then associated with some chest tightness.  Based upon cardiac markers, he is ruled out for myocardial infarction.  Plan to DC IV heparin, ambulate, and if no difficulty, discharge later today impressing upon the patient the need to take his medications as prescribed.  I do not feel he needs coronary angiography.  Probably not a good idea to escalate isosorbide given history of orthostatic hypotension.  Coronary angiography images from May/June timeframe were reviewed.  He has moderate disease in the left coronary system and a totally occluded right coronary.  It is understandable that if his blood pressure gets really high, he might have angina.  I have instructed him on the use of sublingual nitroglycerin.     Progress Note  Patient Name: Alexis Griffith Date of Encounter: 12/18/2019  [facility] Cardiologist: Mertie Moores, MD   Subjective   Chest pain almost resolved. No dyspnea currently.   Inpatient Medications    Scheduled Meds: . aspirin  81 mg Oral Daily  . clonazePAM  0.5 mg Oral BID  . fenofibrate  160 mg Oral Daily  . finasteride  5 mg Oral Daily  . hydrALAZINE  25 mg Oral Q8H  . losartan  100 mg Oral Daily   And  . hydrochlorothiazide  25 mg Oral Daily  . insulin aspart  0-15 Units Subcutaneous Q4H  . isosorbide mononitrate  30 mg Oral Daily  . metoprolol tartrate  50 mg Oral BID  . nitroGLYCERIN  0.5 inch Topical  Q6H  . ranolazine  500 mg Oral BID  . rosuvastatin  10 mg Oral Daily   Continuous Infusions: . sodium chloride 100 mL/hr at 12/18/19 0028  . heparin 950 Units/hr (12/17/19 2310)   PRN Meds: acetaminophen, alum & mag hydroxide-simeth, nitroGLYCERIN, ondansetron (ZOFRAN) IV, polyvinyl alcohol   Vital Signs    Vitals:   12/18/19 0000 12/18/19 0200 12/18/19 0400 12/18/19 0600  BP: (!) 172/65 (!) 134/55 (!) 132/53 (!) 169/64  Pulse: (!) 59 (!) 55 (!) 52 61  Resp:      Temp:      TempSrc:      SpO2: 97% 96% 97% 98%  Weight:      Height:        Intake/Output Summary (Last 24 hours) at 12/18/2019 0740 Last data filed at 12/18/2019 0042 Gross per 24 hour  Intake --  Output 2000 ml  Net -2000 ml   Last 3 Weights 12/17/2019 12/02/2019 12/01/2019  Weight (lbs) 173 lb 8 oz 173 lb 9.6 oz 180 lb  Weight (kg) 78.7 kg 78.744 kg 81.647 kg      Telemetry    SR - Personally Reviewed  ECG    N/A  Physical Exam   GEN: No acute distress.   Neck: No JVD Cardiac: RRR, no murmurs, rubs, or gallops.  Respiratory: Clear to auscultation bilaterally. GI: Soft, nontender, non-distended  MS: No edema; No deformity. Neuro:  Nonfocal  Psych:  Normal affect   Labs    High Sensitivity Troponin:   Recent Labs  Lab 12/01/19 2303 12/17/19 1850 12/17/19 2041 12/18/19 0014 12/18/19 0144  TROPONINIHS 38* 21* 44* 69* 63*      Chemistry Recent Labs  Lab 12/17/19 1850 12/18/19 0014 12/18/19 0144  NA 129*  --  131*  K 3.6  --  3.5  CL 94*  --  96*  CO2 22  --  24  GLUCOSE 143*  --  214*  BUN 25*  --  20  CREATININE 1.24 1.15 1.16  CALCIUM 9.8  --  9.6  GFRNONAA 55* >60 60*  ANIONGAP 13  --  11     Hematology Recent Labs  Lab 12/17/19 1850 12/18/19 0014  WBC 7.7 8.6  RBC 5.39 5.41  HGB 14.4 14.6  HCT 44.0 43.6  MCV 81.6 80.6  MCH 26.7 27.0  MCHC 32.7 33.5  RDW 13.3 13.2  PLT 142* 153    Radiology    DG Chest 2 View  Result Date: 12/17/2019 CLINICAL DATA:   Chest pain on the left EXAM: CHEST - 2 VIEW COMPARISON:  12/01/2019 FINDINGS: The heart size and mediastinal contours are within normal limits. Both lungs are clear. The visualized skeletal structures are unremarkable. IMPRESSION: No active cardiopulmonary disease. Electronically Signed   By: Inez Catalina M.D.   On: 12/17/2019 20:00    Cardiac Studies   INTRAVASCULAR PRESSURE WIRE/FFR STUDY  07/07/2019  LEFT HEART CATH AND CORONARY ANGIOGRAPHY  Conclusion    Ost LM lesion is 30% stenosed.  Prox LAD to Mid LAD lesion is 70% stenosed with 70% stenosed side branch in 2nd Diag. Both vessels positive by CT FFR  Mid LAD to Dist LAD lesion is 40% stenosed. Dist LAD lesion is 75% stenosed.  Prox Cx lesion is 45% stenosed. Prox Cx to Mid Cx lesion is 75% stenosed. RFR positive is 0.87  Mid Cx lesion is 5% stenosed with 50% stenosed side branch in 3rd Mrg. No significant drop in RFR reading beyond this lesion.  Ost RCA to Dist RCA lesion is 100% stenosed. Significant left to right collaterals fill the PDA and PL system.  RPDA lesion is 50% stenosed.  --------------------------------------  The left ventricular systolic function is normal. The left ventricular ejection fraction is 55-65% by visual estimate. Regional wall motion knowledged and inferior wall.  LV end diastolic pressure is moderately elevated.  There is ~SEVERE AORTIC VALVE STENOSIS.    SUMMARY  Severe three-vessel disease: 100% CTO of ostial RCA (left to right collaterals fill PDA and PL), long 70% calcified proximal to mid LAD (positive by CT FFR), segmental ~75% proximal-mid LCx (positive by RFR)  Borderline SEVERE AORTIC STENOSIS-mean gradient estimated 37 mmHg by pullback.  Normal LVEF with basal inferior hypokinesis seen on echocardiogram and LV gram.   Plan will be to consult CVTS/Cardiology Valve Clinic to consider options between two-vessel PCI and TAVR versus CABG/AVR. He is not having resting chest pain,  will discharge home today on Imdur pending Valve Clinic evaluation..   Patient Profile     79 y.o. 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   Assessment & Plan    1. Unstable angina with multi-vessel CAD - Presented with recurrent chest pain. This is his 2nd admission this month for unstable  angina with minimally elevated troponin. Peak of Hs-Troponin 69. Current improved pain.  - Continue Heparin and ASA - Stop nitro ointment >> increase Imdur to 60mg  qd - Continue Renexa 500mg  BID >>>>> can be increase further for additional antianginal +   Add amlodipine  (BP is elevated) - Continue Metoprolol 50mg  BID (unable to uptitrate due to baseline HR in 50s_ - Will review plan with MD   2. HTN - Elevated  - AS above  3. Renal cell carcinoma with recent urinary retention/hematuria - Patient reports his surgery likely in December - Will need to assess surgical clearance as well  4. HLD - 09/09/2019: Cholesterol 130; HDL 40; LDL Cholesterol 76; Triglycerides 68; VLDL 14  - Continue Crestor 10mg  qd (Pharmacist will up-titration with him later today)  And fenofibrate.   Otherwise per primary team   For questions or updates, please contact [place] Please consult [email or web address] for contact info under        SignedLeanor Kail, PA  12/18/2019, 7:40 AM

## 2019-12-18 NOTE — Discharge Summary (Addendum)
Physician Discharge Summary  Patient ID: MAUREEN DUESING MRN: [id number] DOB/AGE: [birth year] 79 y.o.  Admit date: 12/17/2019 Discharge date: 12/18/2019  Admission Diagnoses:  Discharge Diagnoses:  Active Problems:   Chest pain.   Hypertensive urgency.     Hyperlipidemia LDL goal <70   CAD (coronary artery disease)   Chronic diastolic CHF (congestive heart failure) (HCC)   Diabetes mellitus type 2 in nonobese [facility])   Renal mass   S/P TAVR (transcatheter aortic valve replacement)   Chest pain with high risk for cardiac etiology   Chest pain at rest   Discharged Condition: stable  Hospital Course: Patient is a 79 year old Caucasian male with past medical history significant for TIA/Stroke, tongue cancer s/p resection, OSA, AS s/p TAVR, renal mass (awaiting surgery), CAD/NSTEMI, HTN, HLD, GCA (treated), DM2 (A1C of 7.1 in August), HFpEF (TTE in August with grade 1 diastolic and normal EF), Carotid disease and urinary obstruction.  Patient has had complicated cardiac history with multivessel coronary artery disease and aortic stenosis for which only TAVR was done.  Multivessel coronary artery disease has been managed medically.  Patient was admitted with hypertensive urgency and chest pain.  Apparently, the patient has not been compliant with his antihypertensive medications.  On presentation to the hospital, blood pressure was 186/66 mmHg.  Patient was admitted for further assessment and management.  Cardiology team was consulted to assist with patient's management.  Troponin was minimally elevated.  Patient was initially started on heparin drip.  The initial plan was for possible cardiac catheterization, however, after subsequent review by the cardiology team, it was decided that cardiac catheterization would not be of any use.  Cardiology team has cleared patient for discharge.  Physical therapy will see patient prior to discharge.  Patient will follow with a primary care provider and  cardiology team within 1 week of discharge.  Patient has been counseled to comply with medical management.  Consults: cardiology  Significant Diagnostic Studies:  -Was 21 on presentation and peaked at 81.  Last troponin was 63.  Treatments: Optimization of the blood pressure.  Discharge Exam: Blood pressure (!) 133/58, pulse 67, temperature 98.2 F (36.8 C), temperature source Oral, resp. rate 18, height 5\' 10"  (1.778 m), weight 78.7 kg, SpO2 96 %.  Disposition: Discharge disposition: 01-Home or Self Care   Discharge Instructions    Diet - low sodium heart healthy   Complete by: As directed    Increase activity slowly   Complete by: As directed      Allergies as of 12/18/2019      Reactions   Macrodantin [nitrofurantoin] Other (See Comments)   "blocked kidneys"    Tape Other (See Comments)   "Tears my skin and leaves marks." PLEASE USE COBAN!!   Sulfa Antibiotics Rash   Lisinopril Cough   Nutritional Supplements Other (See Comments)   Protein drink given to me "did something"   Penicillins Other (See Comments)   08/30/19- Patient can't remember, was 35+ years ago. OK with trying cephalexin in hospital      Medication List    Notice   Cannot display patient medications because the patient has not yet arrived.      SignedBonnell Public 12/18/2019, 4:19 PM   Addendum: Patient's discharge held.,  Patient developed 8 out of 10 chest pain that radiated to her back, with associated headache and clammy feeling.  We will also hold physical therapy evaluation.

## 2019-12-19 DIAGNOSIS — R0989 Other specified symptoms and signs involving the circulatory and respiratory systems: Secondary | ICD-10-CM

## 2019-12-19 DIAGNOSIS — E119 Type 2 diabetes mellitus without complications: Secondary | ICD-10-CM | POA: Diagnosis not present

## 2019-12-19 DIAGNOSIS — E785 Hyperlipidemia, unspecified: Secondary | ICD-10-CM | POA: Diagnosis not present

## 2019-12-19 DIAGNOSIS — I25118 Atherosclerotic heart disease of native coronary artery with other forms of angina pectoris: Secondary | ICD-10-CM | POA: Diagnosis not present

## 2019-12-19 DIAGNOSIS — R079 Chest pain, unspecified: Secondary | ICD-10-CM | POA: Diagnosis not present

## 2019-12-19 LAB — GLUCOSE, CAPILLARY
Glucose-Capillary: 187 mg/dL — ABNORMAL HIGH (ref 70–99)
Glucose-Capillary: 121 mg/dL — ABNORMAL HIGH (ref 70–99)
Glucose-Capillary: 176 mg/dL — ABNORMAL HIGH (ref 70–99)
Glucose-Capillary: 138 mg/dL — ABNORMAL HIGH (ref 70–99)

## 2019-12-19 LAB — MRSA PCR SCREENING: MRSA by PCR: NEGATIVE

## 2019-12-19 SURGERY — LEFT HEART CATH AND CORONARY ANGIOGRAPHY
Anesthesia: LOCAL

## 2019-12-19 MED ORDER — HYDRALAZINE HCL 25 MG PO TABS
25.0000 mg | ORAL_TABLET | Freq: Three times a day (TID) | ORAL | 0 refills | Status: DC
Start: 2019-12-19 — End: 2019-12-28

## 2019-12-19 NOTE — Discharge Summary (Signed)
PATIENT DETAILS Name: Alexis Griffith Age: 79 y.o. Sex: male Date of Birth: 28-Mar-1940 MRN: [id number]. Admitting Physician: Sid Falcon, MD NTI:[id number], Fransico Him, MD  Admit Date: 12/17/2019 Discharge date: 12/19/2019  Recommendations for Outpatient Follow-up:  1. Follow up with PCP in 1-2 weeks 2. Please obtain CMP/CBC in one week 3. Please ensure follow-up with cardiology. 4. Continue surveillance for lung nodules 5. Continue follow-up with urology for left renal mass  Admitted From:  Home   Disposition: [place]: No  Equipment/Devices: None  Discharge Condition: Stable  CODE STATUS: FULL CODE  Diet recommendation:  Diet Order            Diet Heart Room service appropriate? Yes; Fluid consistency: Thin  Diet effective now           Diet - low sodium heart healthy                  Brief Summary: Patient is a 79 y.o. male with history of known multivessel CAD (managed medically), severe AS s/p TAVR, known renal mass-awaiting outpatient surgery, DM-2, chronic diastolic heart failure, HLD-presented with chest pain-concern for angina.  See below for further details  Brief Hospital Course: Angina pectoris: Underwent cardiology evaluation-had episodes of chest pain while hospitalized-this morning-patient was ambulated-did not have chest pain.  Cardiology expects some amount of angina given his coronary anatomy.  Discussed with Dr. Girtha Hake discharge-continue medical management and outpatient follow-up with cardiology.   HTN: BP continues to be labile-continue Imdur/losartan/HCTZ/Lopressor-PCP/cardiology to reassess and adjust accordingly.  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  HLD: Continue Crestor/fenofibrate  DM-2: CBG stable-continue SSI-usual regimen of Lantus 60 units daily and Januvia to be resumed on  discharge. Patient to follow-up with PCP for further optimization  Known left renal mass: Continue outpatient urology follow-up  Known right lung nodule: Stable on most recent CT-continue outpatient follow-up by his PCP/cardiology.  Discharge Diagnoses:  Active Problems:   Labile hypertension   Hyperlipidemia LDL goal <70   CAD (coronary artery disease)   Chronic diastolic CHF (congestive heart failure) (HCC)   Diabetes mellitus type 2 in nonobese [facility])   Renal mass   S/P TAVR (transcatheter aortic valve replacement)   Chest pain with high risk for cardiac etiology   Chest pain at rest   Discharge Instructions:  Activity:  As tolerated   Discharge Instructions    Call MD for:  difficulty breathing, headache or visual disturbances   Complete by: As directed    Call MD for:  persistant dizziness or light-headedness   Complete by: As directed    Call MD for:  severe uncontrolled pain   Complete by: As directed    Diet - low sodium heart healthy   Complete by: As directed    Discharge instructions   Complete by: As directed    Follow with Primary MD  Tisovec, Fransico Him, MD in 1-2 weeks  Please get a complete blood count and chemistry panel checked by your Primary MD at your next visit, and again as instructed by your Primary MD.  Get Medicines reviewed and adjusted: Please take all your medications with you for your next visit with your Primary MD  Laboratory/radiological data: Please request your Primary MD to go over all hospital tests and procedure/radiological results at the follow up, please ask your Primary MD to get all Hospital records  sent to his/her office.  In some cases, they will be blood work, cultures and biopsy results pending at the time of your discharge. Please request that your primary care M.D. follows up on these results.  Also Note the following: If you experience worsening of your admission symptoms, develop shortness of breath, life threatening  emergency, suicidal or homicidal thoughts you must seek medical attention immediately by calling 911 or calling your MD immediately  if symptoms less severe.  You must read complete instructions/literature along with all the possible adverse reactions/side effects for all the Medicines you take and that have been prescribed to you. Take any new Medicines after you have completely understood and accpet all the possible adverse reactions/side effects.   Do not drive when taking Pain medications or sleeping medications (Benzodaizepines)  Do not take more than prescribed Pain, Sleep and Anxiety Medications. It is not advisable to combine anxiety,sleep and pain medications without talking with your primary care practitioner  Special Instructions: If you have smoked or chewed Tobacco  in the last 2 yrs please stop smoking, stop any regular Alcohol  and or any Recreational drug use.  Wear Seat belts while driving.  Please note: You were cared for by a hospitalist during your hospital stay. Once you are discharged, your primary care physician will handle any further medical issues. Please note that NO REFILLS for any discharge medications will be authorized once you are discharged, as it is imperative that you return to your primary care physician (or establish a relationship with a primary care physician if you do not have one) for your post hospital discharge needs so that they can reassess your need for medications and monitor your lab values.   Increase activity slowly   Complete by: As directed    Increase activity slowly   Complete by: As directed      Allergies as of 12/19/2019      Reactions   Macrodantin [nitrofurantoin] Other (See Comments)   "blocked kidneys"    Tape Other (See Comments)   "Tears my skin and leaves marks." PLEASE USE COBAN!!   Sulfa Antibiotics Rash   Lisinopril Cough   Nutritional Supplements Other (See Comments)   Protein drink given to me "did something"    Penicillins Other (See Comments)   08/30/19- Patient can't remember, was 35+ years ago. OK with trying cephalexin in hospital      Medication List    STOP taking these medications   azithromycin 500 MG tablet Commonly known as: Zithromax   doxycycline 100 MG tablet Commonly known as: VIBRA-TABS     TAKE these medications   aspirin 81 MG chewable tablet Chew 1 tablet (81 mg total) by mouth daily.   carboxymethylcellulose 0.5 % Soln Commonly known as: REFRESH PLUS Place 1 drop into both eyes every 12 (twelve) hours as needed (irritation).   clonazePAM 0.5 MG tablet Commonly known as: KLONOPIN Take 1 tablet (0.5 mg total) by mouth 4 (four) times daily. What changed: when to take this   FENOFIBRATE PO Take 1 tablet by mouth daily.   finasteride 5 MG tablet Commonly known as: PROSCAR Take 1 tablet (5 mg total) by mouth daily.   hydrALAZINE 25 MG tablet Commonly known as: APRESOLINE Take 1 tablet (25 mg total) by mouth every 8 (eight) hours.   insulin glargine 100 UNIT/ML injection Commonly known as: LANTUS Inject 60 Units into the skin daily.   isosorbide mononitrate 30 MG 24 hr tablet Commonly known as: IMDUR Take  1 tablet (30 mg total) by mouth daily.   losartan-hydrochlorothiazide 100-25 MG tablet Commonly known as: HYZAAR Take 1 tablet by mouth daily.   magnesium oxide 400 (241.3 Mg) MG tablet Commonly known as: MAG-OX Take 1 tablet (400 mg total) by mouth daily.   metoprolol tartrate 50 MG tablet Commonly known as: LOPRESSOR Take 1 tablet (50 mg total) by mouth 2 (two) times daily.   nitroGLYCERIN 0.4 MG SL tablet Commonly known as: NITROSTAT Place 1 tablet (0.4 mg total) under the tongue every 5 (five) minutes as needed for chest pain (up to 3 doses). Do not give if blood pressure is low (less than 696 systolic).   glucose blood test strip 1 each by Other route as needed.   ONE TOUCH ULTRA TEST test strip Generic drug: glucose blood 1 each by Other  route daily.   OneTouch Delica Plus [id number] Misc 1 each by Other route daily.   ranolazine 500 MG 12 hr tablet Commonly known as: RANEXA Take 1 tablet (500 mg total) by mouth 2 (two) times daily.   rosuvastatin 10 MG tablet Commonly known as: CRESTOR Take 1 tablet (10 mg total) by mouth daily.   sitaGLIPtin 100 MG tablet Commonly known as: JANUVIA Take 100 mg by mouth daily.       Follow-up Information    Tisovec, Fransico Him, MD. Schedule an appointment as soon as possible for a visit in 1 month(s).   Specialty: Internal Medicine Contact information: [place] [address] [phone]        Nahser, Wonda Cheng, MD. Schedule an appointment as soon as possible for a visit in 1 week(s).   Specialty: Cardiology Contact information: [place] [address] [phone]        Deboraha Sprang, MD Follow up in 1 month(s).   Specialty: Cardiology Contact information: [address] [place] [address] [phone]        Sherren Mocha, MD. Schedule an appointment as soon as possible for a visit in 1 month(s).   Specialty: Cardiology Contact information: [address] [place] [address] [phone]              Allergies  Allergen Reactions  . Macrodantin [Nitrofurantoin] Other (See Comments)    "blocked kidneys"   . Tape Other (See Comments)    "Tears my skin and leaves marks." PLEASE USE COBAN!!  . Sulfa Antibiotics Rash  . Lisinopril Cough  . Nutritional Supplements Other (See Comments)    Protein drink given to me "did something"  . Penicillins Other (See Comments)    08/30/19- Patient can't remember, was 35+ years ago. OK with trying cephalexin in hospital      Consultations:  Cardiology   Other Procedures/Studies: DG Chest 2 View  Result Date: 12/17/2019 CLINICAL DATA:  Chest pain on the left EXAM: CHEST - 2 VIEW COMPARISON:  12/01/2019 FINDINGS: The  heart size and mediastinal contours are within normal limits. Both lungs are clear. The visualized skeletal structures are unremarkable. IMPRESSION: No active cardiopulmonary disease. Electronically Signed   By: Inez Catalina M.D.   On: 12/17/2019 20:00   DG Chest 2 View  Result Date: 12/01/2019 CLINICAL DATA:  Chest pain EXAM: CHEST - 2 VIEW COMPARISON:  10/20/2019 FINDINGS: Aortic valve replacement. The heart size and mediastinal contours are within normal limits. Atherosclerotic calcification of the aortic knob. Both lungs are clear. The visualized skeletal structures are unremarkable. IMPRESSION: No  active cardiopulmonary disease. Electronically Signed   By: Davina Poke D.O.   On: 12/01/2019 12:55     TODAY-DAY OF DISCHARGE:  Subjective:   Alexis Griffith today has no headache,no chest abdominal pain,no new weakness tingling or numbness, feels much better wants to go home today.   Objective:   Blood pressure (!) 173/69, pulse 71, temperature 97.9 F (36.6 C), temperature source Oral, resp. rate 14, height 5\' 10"  (1.778 m), weight 79.4 kg, SpO2 97 %.  Intake/Output Summary (Last 24 hours) at 12/19/2019 1143 Last data filed at 12/19/2019 0835 Gross per 24 hour  Intake --  Output 2350 ml  Net -2350 ml   Filed Weights   12/17/19 1821 12/18/19 2343  Weight: 78.7 kg 79.4 kg    Exam: Awake Alert, Oriented *3, No new F.N deficits, Normal affect [place].AT,PERRAL Supple Neck,No JVD, No cervical lymphadenopathy appriciated.  Symmetrical Chest wall movement, Good air movement bilaterally, CTAB RRR,No Gallops,Rubs or new Murmurs, No Parasternal Heave +ve B.Sounds, Abd Soft, Non tender, No organomegaly appriciated, No rebound -guarding or rigidity. No Cyanosis, Clubbing or edema, No new Rash or bruise   PERTINENT RADIOLOGIC STUDIES: DG Chest 2 View  Result Date: 12/17/2019 CLINICAL DATA:  Chest pain on the left EXAM: CHEST - 2 VIEW COMPARISON:  12/01/2019 FINDINGS: The heart size and  mediastinal contours are within normal limits. Both lungs are clear. The visualized skeletal structures are unremarkable. IMPRESSION: No active cardiopulmonary disease. Electronically Signed   By: Inez Catalina M.D.   On: 12/17/2019 20:00     PERTINENT LAB RESULTS: CBC: Recent Labs    12/17/19 1850 12/18/19 0014  WBC 7.7 8.6  HGB 14.4 14.6  HCT 44.0 43.6  PLT 142* 153   CMET CMP     Component Value Date/Time   NA 131 (L) 12/18/2019 0144   NA 137 07/04/2019 1216   K 3.5 12/18/2019 0144   CL 96 (L) 12/18/2019 0144   CO2 24 12/18/2019 0144   GLUCOSE 214 (H) 12/18/2019 0144   BUN 20 12/18/2019 0144   BUN 23 07/04/2019 1216   CREATININE 1.16 12/18/2019 0144   CREATININE 1.14 03/25/2015 0920   CALCIUM 9.6 12/18/2019 0144   PROT 5.4 (L) 10/21/2019 0327   PROT 6.9 06/14/2019 0959   ALBUMIN 3.0 (L) 10/21/2019 0327   ALBUMIN 4.6 06/14/2019 0959   AST 16 10/21/2019 0327   ALT 15 10/21/2019 0327   ALKPHOS 32 (L) 10/21/2019 0327   BILITOT 0.5 10/21/2019 0327   BILITOT 0.3 06/14/2019 0959   GFRNONAA 60 (L) 12/18/2019 0144   GFRAA >60 12/02/2019 0430    GFR Estimated Creatinine Clearance: 54.2 mL/min (by C-G formula based on SCr of 1.16 mg/dL). No results for input(s): LIPASE, AMYLASE in the last 72 hours. No results for input(s): CKTOTAL, CKMB, CKMBINDEX, TROPONINI in the last 72 hours. Invalid input(s): POCBNP No results for input(s): DDIMER in the last 72 hours. No results for input(s): HGBA1C in the last 72 hours. No results for input(s): CHOL, HDL, LDLCALC, TRIG, CHOLHDL, LDLDIRECT in the last 72 hours. No results for input(s): TSH, T4TOTAL, T3FREE, THYROIDAB in the last 72 hours.  Invalid input(s): FREET3 No results for input(s): VITAMINB12, FOLATE, FERRITIN, TIBC, IRON, RETICCTPCT in the last 72 hours. Coags: No results for input(s): INR in the last 72 hours.  Invalid input(s): PT Microbiology: Recent Results (from the past 240 hour(s))  Respiratory Panel by RT PCR  (Flu A&B, Covid) - Nasopharyngeal Swab     Status: None  Collection Time: 12/17/19 11:11 PM   Specimen: Nasopharyngeal Swab  Result Value Ref Range Status   SARS Coronavirus 2 by RT PCR NEGATIVE NEGATIVE Final    Comment: (NOTE) SARS-CoV-2 target nucleic acids are NOT DETECTED.  The SARS-CoV-2 RNA is generally detectable in upper respiratoy specimens during the acute phase of infection. The lowest concentration of SARS-CoV-2 viral copies this assay can detect is 131 copies/mL. A negative result does not preclude SARS-Cov-2 infection and should not be used as the sole basis for treatment or other patient management decisions. A negative result may occur with  improper specimen collection/handling, submission of specimen other than nasopharyngeal swab, presence of viral mutation(s) within the areas targeted by this assay, and inadequate number of viral copies (<131 copies/mL). A negative result must be combined with clinical observations, patient history, and epidemiological information. The expected result is Negative.  Fact Sheet for Patients:  PinkCheek.be  Fact Sheet for Healthcare Providers:  GravelBags.it  This test is no t yet approved or cleared by the [place] FDA and  has been authorized for detection and/or diagnosis of SARS-CoV-2 by FDA under an Emergency Use Authorization (EUA). This EUA will remain  in effect (meaning this test can be used) for the duration of the COVID-19 declaration under Section 564(b)(1) of the Act, 21 U.S.C. section 360bbb-3(b)(1), unless the authorization is terminated or revoked sooner.     Influenza A by PCR NEGATIVE NEGATIVE Final   Influenza B by PCR NEGATIVE NEGATIVE Final    Comment: (NOTE) The Xpert Xpress SARS-CoV-2/FLU/RSV assay is intended as an aid in  the diagnosis of influenza from Nasopharyngeal swab specimens and  should not be used as a sole basis for treatment.  Nasal washings and  aspirates are unacceptable for Xpert Xpress SARS-CoV-2/FLU/RSV  testing.  Fact Sheet for Patients: PinkCheek.be  Fact Sheet for Healthcare Providers: GravelBags.it  This test is not yet approved or cleared by the [place] FDA and  has been authorized for detection and/or diagnosis of SARS-CoV-2 by  FDA under an Emergency Use Authorization (EUA). This EUA will remain  in effect (meaning this test can be used) for the duration of the  Covid-19 declaration under Section 564(b)(1) of the Act, 21  U.S.C. section 360bbb-3(b)(1), unless the authorization is  terminated or revoked. Performed at [facility], [place] [address], [place], [place] [address]   MRSA PCR Screening     Status: None   Collection Time: 12/19/19 12:02 AM   Specimen: Nasal Mucosa; Nasopharyngeal  Result Value Ref Range Status   MRSA by PCR NEGATIVE NEGATIVE Final    Comment:        The GeneXpert MRSA Assay (FDA approved for NASAL specimens only), is one component of a comprehensive MRSA colonization surveillance program. It is not intended to diagnose MRSA infection nor to guide or monitor treatment for MRSA infections. Performed at [facility], [place] [address] [place],  [address]     FURTHER DISCHARGE INSTRUCTIONS:  Get Medicines reviewed and adjusted: Please take all your medications with you for your next visit with your Primary MD  Laboratory/radiological data: Please request your Primary MD to go over all hospital tests and procedure/radiological results at the follow up, please ask your Primary MD to get all Hospital records sent to his/her office.  In some cases, they will be blood work, cultures and biopsy results pending at the time of your discharge. Please request that your primary care M.D. goes through all the  records of your hospital data and follows up on these results.  Also Note the  following: If you experience worsening of your admission symptoms, develop shortness of breath, life threatening emergency, suicidal or homicidal thoughts you must seek medical attention immediately by calling 911 or calling your MD immediately  if symptoms less severe.  You must read complete instructions/literature along with all the possible adverse reactions/side effects for all the Medicines you take and that have been prescribed to you. Take any new Medicines after you have completely understood and accpet all the possible adverse reactions/side effects.   Do not drive when taking Pain medications or sleeping medications (Benzodaizepines)  Do not take more than prescribed Pain, Sleep and Anxiety Medications. It is not advisable to combine anxiety,sleep and pain medications without talking with your primary care practitioner  Special Instructions: If you have smoked or chewed Tobacco  in the last 2 yrs please stop smoking, stop any regular Alcohol  and or any Recreational drug use.  Wear Seat belts while driving.  Please note: You were cared for by a hospitalist during your hospital stay. Once you are discharged, your primary care physician will handle any further medical issues. Please note that NO REFILLS for any discharge medications will be authorized once you are discharged, as it is imperative that you return to your primary care physician (or establish a relationship with a primary care physician if you do not have one) for your post hospital discharge needs so that they can reassess your need for medications and monitor your lab values.  Total Time spent coordinating discharge including counseling, education and face to face time equals 35 minutes.  SignedOren Binet 12/19/2019 11:43 AM

## 2019-12-19 NOTE — Evaluation (Signed)
Physical Therapy Evaluation Patient Details Name: Alexis Griffith MRN: [id number] DOB: 08-25-1940 Today's Date: 12/19/2019   History of Present Illness  79 year old male with PMH significant for TIA/Stroke, tongue cancer s/p resection, OSA, AS s/p TAVR, renal mass (awaiting surgery), CAD/NSTEMI, HTN, HLD, GCA (treated), DM2 (A1C of 7.1 in August), HFpEF (TTE in August with grade 1 diastolic and normal EF), Carotid disease and urinary obstruction. Brought to ED by EMS with chest pains improved with nitrogylcerin. EKG showed likely J Point elevation in the anterior leads, but appears very similar to EKG on 12/01/19. Admitted for observation 12/17/19  Clinical Impression  PTA pt living with wife in single story home with 1 step to enter. Pt was independent with mobility without AD, ADLs and iADLs. Pt is currently limited in safe mobility by deconditioning from decreased mobility over the past couple of days. Pt requires mod I for bed mobility, supervision for transfers and min guard progressing to supervision for ambulation of 450 feet without AD. Pt will continue to follow acutely, however will have no PT or equipment needs at discharge.   Orthostatic BPs                                                             BP                  HR bpm             SaO2 on RA Supine 174/62 72 98%  Sitting 163/63 72 97%  Standing 126/59 98 96%  Max HR noted with ambulation  106   After ambulation  168/62 96 97%       Follow Up Recommendations No PT follow up;Supervision for mobility/OOB    Equipment Recommendations  None recommended by PT       Precautions / Restrictions Precautions Precautions: None Restrictions Weight Bearing Restrictions: No      Mobility  Bed Mobility Overal bed mobility: Modified Independent             General bed mobility comments: increased time and effort to come to EoB  Transfers Overall transfer level: Needs assistance Equipment used: None Transfers: Sit  to/from Stand Sit to Stand: Supervision         General transfer comment: supervision for safety good power up and self steadying   Ambulation/Gait Ambulation/Gait assistance: Min guard;Supervision Gait Distance (Feet): 450 Feet Assistive device: None Gait Pattern/deviations: Step-through pattern;Decreased step length - right;Decreased step length - left Gait velocity: slowed Gait velocity interpretation: <1.8 ft/sec, indicate of risk for recurrent falls General Gait Details: initially min guard for safety with a few missteps at beginning of gait, no overt LoB and able to self steady as pt progressed he became steadyier and only required supervision for safety         Balance Overall balance assessment: Mild deficits observed, not formally tested                                           Pertinent Vitals/Pain Pain Assessment: No/denies pain    Home Living Family/patient expects to be discharged to:: Private residence Living Arrangements: Spouse/significant other Available Help at Discharge: Family;Available  24 hours/day Type of Home: House Home Access: Stairs to enter   [employer] of Steps: 1 step (in the house) Home Layout: One level Home Equipment: Walker - 2 wheels;Grab bars - tub/shower      Prior Function Level of Independence: Independent         Comments: driving     Hand Dominance   Dominant Hand: Right    Extremity/Trunk Assessment   Upper Extremity Assessment Upper Extremity Assessment: Overall WFL for tasks assessed    Lower Extremity Assessment Lower Extremity Assessment: Overall WFL for tasks assessed       Communication   Communication: No difficulties  Cognition Arousal/Alertness: Awake/alert Behavior During Therapy: WFL for tasks assessed/performed Overall Cognitive Status: Within Functional Limits for tasks assessed                                               Assessment/Plan     PT Assessment Patient needs continued PT services  PT Problem List Decreased activity tolerance;Cardiopulmonary status limiting activity       PT Treatment Interventions Gait training;Functional mobility training;Therapeutic activities;Therapeutic exercise;Balance training;Cognitive remediation;Patient/family education    PT Goals (Current goals can be found in the Care Plan section)  Acute Rehab PT Goals Patient Stated Goal: stop coming back and forth to the hospital  PT Goal Formulation: With patient    Frequency Min 3X/week    AM-PAC PT "6 Clicks" Mobility  Outcome Measure Help needed turning from your back to your side while in a flat bed without using bedrails?: None Help needed moving from lying on your back to sitting on the side of a flat bed without using bedrails?: None Help needed moving to and from a bed to a chair (including a wheelchair)?: None Help needed standing up from a chair using your arms (e.g., wheelchair or bedside chair)?: None Help needed to walk in hospital room?: None Help needed climbing 3-5 steps with a railing? : A Little 6 Click Score: 23    End of Session Equipment Utilized During Treatment: Gait belt Activity Tolerance: Patient tolerated treatment well Patient left: in chair;with call bell/phone within reach Nurse Communication: Mobility status PT Visit Diagnosis: Unsteadiness on feet (R26.81);Other abnormalities of gait and mobility (R26.89);Muscle weakness (generalized) (M62.81);Difficulty in walking, not elsewhere classified (R26.2)    Time: 1660-6301 PT Time Calculation (min) (ACUTE ONLY): 33 min   Charges:   PT Evaluation $PT Eval Moderate Complexity: 1 Mod PT Treatments $Therapeutic Exercise: 8-22 mins        Jaleeya Mcnelly B. Migdalia Dk PT, DPT Acute Rehabilitation Services Pager [phone] Office [phone]   [place] 12/19/2019, 10:25 AM

## 2019-12-19 NOTE — Progress Notes (Addendum)
PROGRESS NOTE        PATIENT DETAILS Name: Alexis Griffith Age: 79 y.o. Sex: male Date of Birth: [birth year] Admit Date: 12/17/2019 Admitting Physician Sid Falcon, MD [id number], Fransico Him, MD  Brief Narrative: Patient is a 79 y.o. male with history of known multivessel CAD (managed medically), severe AS s/p TAVR, known renal mass-awaiting outpatient surgery, DM-2, chronic diastolic heart failure, HLD-presented with chest pain-concern for angina.  See below for further details  Subjective: Had several episodes of chest pain yesterday after cardiology evaluation was completed.  No chest pain since yesterday evening.  Lying comfortably in bed.  Assessment/Plan: Angina pectoris: Had 2 episodes of chest pain yesterday relieved by nitroglycerin-initial plans were for discharge-but due to recurrent anginal episodes-cardiology was reconsulted-plans are for ambulation to see how he does-keep n.p.o. for now as he may require LHC later today.  Remains on aspirin, Ranexa, statin and beta-blocker.  HTN: BP continues to be labile-continue Imdur/losartan/HCTZ/Lopressor-reassess-and adjust accordingly.  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  HLD: Continue Crestor/fenofibrate  DM-2: CBG stable-continue SSI-Januvia on hold  Known left renal mass: Continue outpatient urology follow-up  Known right lung nodule: Stable on most recent CT-continue outpatient follow-up by his PCP/cardiology.  Consults: Cardiology  DVT Prophylaxis : May need to start SQ Lovenox if patient still hospitalized-currently n.p.o. with potential plans for LHC later today.  Diet: Diet Order            Diet NPO time specified  Diet effective now           Diet - low sodium heart healthy                  Code Status: Full code   Family Communication: None at  bedside-patient awake/alert-he will update family himself.  Disposition Plan: Home when ready for discharge Status is: Observation  The patient remains OBS appropriate and will d/c before 2 midnights.  Dispo: The patient is from: Home              Anticipated d/c is to: Home              Anticipated d/c date is: 1 day              Patient currently is not medically stable to d/c.   Barriers to Discharge: Ongoing chest pain-May need LHC later today-see cardiology note.  Antimicrobial agents: Anti-infectives (From admission, onward)   None       Time spent: 25 minutes-Greater than 50% of this time was spent in counseling, explanation of diagnosis, planning of further management, and coordination of care.  MEDICATIONS: Scheduled Meds: . aspirin  81 mg Oral Daily  . Chlorhexidine Gluconate Cloth  6 each Topical Daily  . clonazePAM  0.5 mg Oral BID  . fenofibrate  160 mg Oral Daily  . finasteride  5 mg Oral Daily  . hydrALAZINE  25 mg Oral Q8H  . losartan  100 mg Oral Daily   And  . hydrochlorothiazide  25 mg Oral Daily  . insulin aspart  0-15 Units Subcutaneous Q4H  . isosorbide mononitrate  30 mg Oral Daily  . metoprolol tartrate  50 mg Oral BID  . ranolazine  500 mg Oral BID  . rosuvastatin  10 mg Oral Daily   Continuous Infusions: PRN Meds:.acetaminophen, alum & mag hydroxide-simeth, ondansetron (ZOFRAN) IV, polyvinyl alcohol   PHYSICAL EXAM: Vital signs: Vitals:   12/18/19 2230 12/18/19 2259 12/18/19 2343 12/19/19 0800  BP: (!) 157/73  (!) 157/70   Pulse: 90     Resp: 18     Temp:  98.1 F (36.7 C) 97.8 F (36.6 C) 97.9 F (36.6 C)  TempSrc:  Oral Axillary Oral  SpO2:      Weight:   79.4 kg   Height:   5\' 10"  (1.778 m)    Filed Weights   12/17/19 1821 12/18/19 2343  Weight: 78.7 kg 79.4 kg   Body mass index is 25.12 kg/m.   Gen Exam:Alert awake-not in any distress HEENT:atraumatic, normocephalic Chest: B/L clear to auscultation  anteriorly CVS:S1S2 regular Abdomen:soft non tender, non distended Extremities:no edema Neurology: Non focal Skin: no rash  I have personally reviewed following labs and imaging studies  LABORATORY DATA: CBC: Recent Labs  Lab 12/17/19 1850 12/18/19 0014  WBC 7.7 8.6  HGB 14.4 14.6  HCT 44.0 43.6  MCV 81.6 80.6  PLT 142* 734    Basic Metabolic Panel: Recent Labs  Lab 12/17/19 1850 12/18/19 0014 12/18/19 0144  NA 129*  --  131*  K 3.6  --  3.5  CL 94*  --  96*  CO2 22  --  24  GLUCOSE 143*  --  214*  BUN 25*  --  20  CREATININE 1.24 1.15 1.16  CALCIUM 9.8  --  9.6    GFR: Estimated Creatinine Clearance: 54.2 mL/min (by C-G formula based on SCr of 1.16 mg/dL).  Liver Function Tests: No results for input(s): AST, ALT, ALKPHOS, BILITOT, PROT, ALBUMIN in the last 168 hours. No results for input(s): LIPASE, AMYLASE in the last 168 hours. No results for input(s): AMMONIA in the last 168 hours.  Coagulation Profile: No results for input(s): INR, PROTIME in the last 168 hours.  Cardiac Enzymes: No results for input(s): CKTOTAL, CKMB, CKMBINDEX, TROPONINI in the last 168 hours.  BNP (last 3 results) No results for input(s): PROBNP in the last 8760 hours.  Lipid Profile: No results for input(s): CHOL, HDL, LDLCALC, TRIG, CHOLHDL, LDLDIRECT in the last 72 hours.  Thyroid Function Tests: No results for input(s): TSH, T4TOTAL, FREET4, T3FREE, THYROIDAB in the last 72 hours.  Anemia Panel: No results for input(s): VITAMINB12, FOLATE, FERRITIN, TIBC, IRON, RETICCTPCT in the last 72 hours.  Urine analysis:    Component Value Date/Time   COLORURINE AMBER (A) 10/20/2019 1113   APPEARANCEUR CLOUDY (A) 10/20/2019 1113   LABSPEC 1.010 10/20/2019 1113   PHURINE 6.0 10/20/2019 1113   GLUCOSEU NEGATIVE 10/20/2019 1113   HGBUR MODERATE (A) 10/20/2019 1113   BILIRUBINUR NEGATIVE 10/20/2019 1113   KETONESUR NEGATIVE 10/20/2019 1113   PROTEINUR 30 (A) 10/20/2019 1113    NITRITE POSITIVE (A) 10/20/2019 1113   LEUKOCYTESUR LARGE (A) 10/20/2019 1113    Sepsis Labs: Lactic Acid, Venous    Component Value Date/Time   LATICACIDVEN 1.2 10/20/2019 1549    MICROBIOLOGY: Recent Results (from the past 240 hour(s))  Respiratory Panel by RT PCR (Flu A&B, Covid) - Nasopharyngeal Swab     Status: None   Collection Time: 12/17/19 11:11 PM   Specimen: Nasopharyngeal Swab  Result Value Ref Range Status   SARS Coronavirus 2 by RT PCR NEGATIVE NEGATIVE Final    Comment: (NOTE) SARS-CoV-2 target nucleic acids are NOT DETECTED.  The SARS-CoV-2 RNA  is generally detectable in upper respiratoy specimens during the acute phase of infection. The lowest concentration of SARS-CoV-2 viral copies this assay can detect is 131 copies/mL. A negative result does not preclude SARS-Cov-2 infection and should not be used as the sole basis for treatment or other patient management decisions. A negative result may occur with  improper specimen collection/handling, submission of specimen other than nasopharyngeal swab, presence of viral mutation(s) within the areas targeted by this assay, and inadequate number of viral copies (<131 copies/mL). A negative result must be combined with clinical observations, patient history, and epidemiological information. The expected result is Negative.  Fact Sheet for Patients:  PinkCheek.be  Fact Sheet for Healthcare Providers:  GravelBags.it  This test is no t yet approved or cleared by the [place] FDA and  has been authorized for detection and/or diagnosis of SARS-CoV-2 by FDA under an Emergency Use Authorization (EUA). This EUA will remain  in effect (meaning this test can be used) for the duration of the COVID-19 declaration under Section 564(b)(1) of the Act, 21 U.S.C. section 360bbb-3(b)(1), unless the authorization is terminated or revoked sooner.     Influenza A by PCR  NEGATIVE NEGATIVE Final   Influenza B by PCR NEGATIVE NEGATIVE Final    Comment: (NOTE) The Xpert Xpress SARS-CoV-2/FLU/RSV assay is intended as an aid in  the diagnosis of influenza from Nasopharyngeal swab specimens and  should not be used as a sole basis for treatment. Nasal washings and  aspirates are unacceptable for Xpert Xpress SARS-CoV-2/FLU/RSV  testing.  Fact Sheet for Patients: PinkCheek.be  Fact Sheet for Healthcare Providers: GravelBags.it  This test is not yet approved or cleared by the [place] FDA and  has been authorized for detection and/or diagnosis of SARS-CoV-2 by  FDA under an Emergency Use Authorization (EUA). This EUA will remain  in effect (meaning this test can be used) for the duration of the  Covid-19 declaration under Section 564(b)(1) of the Act, 21  U.S.C. section 360bbb-3(b)(1), unless the authorization is  terminated or revoked. Performed at [facility], [place] [address] [place], [place] [address]   MRSA PCR Screening     Status: None   Collection Time: 12/19/19 12:02 AM   Specimen: Nasal Mucosa; Nasopharyngeal  Result Value Ref Range Status   MRSA by PCR NEGATIVE NEGATIVE Final    Comment:        The GeneXpert MRSA Assay (FDA approved for NASAL specimens only), is one component of a comprehensive MRSA colonization surveillance program. It is not intended to diagnose MRSA infection nor to guide or monitor treatment for MRSA infections. Performed at [facility], [place] [address] [place], [place] [address]     RADIOLOGY STUDIES/RESULTS: DG Chest 2 View  Result Date: 12/17/2019 CLINICAL DATA:  Chest pain on the left EXAM: CHEST - 2 VIEW COMPARISON:  12/01/2019 FINDINGS: The heart size and mediastinal contours are within normal limits. Both lungs are clear. The visualized skeletal structures are unremarkable. IMPRESSION: No active cardiopulmonary disease.  Electronically Signed   By: Inez Catalina M.D.   On: 12/17/2019 20:00     LOS: 0 days   Oren Binet, MD  [facility]    To contact the attending provider between 7A-7P or the covering provider during after hours 7P-7A, please log into the web site [email or web address] and access using universal [place] password for that web site. If you do not have the password, please call the hospital operator.  12/19/2019, 9:24  AM

## 2019-12-19 NOTE — Care Management Obs Status (Signed)
[place] NOTIFICATION   Patient Details  Name: Alexis Griffith MRN: [id number] Date of Birth: 06/17/1940   Medicare Observation Status Notification Given:  Yes    Zenon Mayo, RN 12/19/2019, 9:32 AM

## 2019-12-19 NOTE — Progress Notes (Signed)
Progress Note  Patient Name: Alexis Griffith Date of Encounter: 12/19/2019  [facility] Cardiologist: Mertie Moores, MD   Subjective   Several episodes of chest pain yesterday in the emergency room.  This led to admission after we attempted to send him home.  Cardiac markers are flat.  He feels much better this morning.  He used nitro yesterday.  His anatomy is such that he will have angina from time to time.  He has a chronically occluded right coronary and moderate to moderately severe diffuse left coronary disease.  He feels fine this morning.  He has not ambulated.  He was surprised when I stated heart catheterization may be needed today.  He wants to go home.  He will need to ambulate and be free of significant angina/change in pattern to be safe going home.  Inpatient Medications    Scheduled Meds: . aspirin  81 mg Oral Daily  . Chlorhexidine Gluconate Cloth  6 each Topical Daily  . clonazePAM  0.5 mg Oral BID  . fenofibrate  160 mg Oral Daily  . finasteride  5 mg Oral Daily  . hydrALAZINE  25 mg Oral Q8H  . losartan  100 mg Oral Daily   And  . hydrochlorothiazide  25 mg Oral Daily  . insulin aspart  0-15 Units Subcutaneous Q4H  . isosorbide mononitrate  30 mg Oral Daily  . metoprolol tartrate  50 mg Oral BID  . ranolazine  500 mg Oral BID  . rosuvastatin  10 mg Oral Daily   Continuous Infusions:  PRN Meds: acetaminophen, alum & mag hydroxide-simeth, ondansetron (ZOFRAN) IV, polyvinyl alcohol   Vital Signs    Vitals:   12/18/19 2230 12/18/19 2259 12/18/19 2343 12/19/19 0800  BP: (!) 157/73  (!) 157/70   Pulse: 90     Resp: 18     Temp:  98.1 F (36.7 C) 97.8 F (36.6 C) 97.9 F (36.6 C)  TempSrc:  Oral Axillary Oral  SpO2:      Weight:   79.4 kg   Height:   5\' 10"  (1.778 m)     Intake/Output Summary (Last 24 hours) at 12/19/2019 0855 Last data filed at 12/19/2019 0835 Gross per 24 hour  Intake --  Output 2350 ml  Net -2350 ml   Last 3 Weights  12/18/2019 12/17/2019 12/02/2019  Weight (lbs) 175 lb 0.7 oz 173 lb 8 oz 173 lb 9.6 oz  Weight (kg) 79.4 kg 78.7 kg 78.744 kg      Telemetry    Sinus rhythm and sinus bradycardia- Personally Reviewed  ECG    A new tracing is not performed- Personally Reviewed  Physical Exam  Lying flat without any significant difficulty. GEN: No acute distress.   Neck: No JVD Cardiac: RRR, no murmurs, rubs, or gallops.  Respiratory: Clear to auscultation bilaterally. GI: Soft, nontender, non-distended  MS: No edema; No deformity. Neuro:  Nonfocal  Psych: Normal affect   Labs    High Sensitivity Troponin:   Recent Labs  Lab 12/01/19 2303 12/17/19 1850 12/17/19 2041 12/18/19 0014 12/18/19 0144  TROPONINIHS 38* 21* 44* 69* 63*      Chemistry Recent Labs  Lab 12/17/19 1850 12/18/19 0014 12/18/19 0144  NA 129*  --  131*  K 3.6  --  3.5  CL 94*  --  96*  CO2 22  --  24  GLUCOSE 143*  --  214*  BUN 25*  --  20  CREATININE 1.24 1.15 1.16  CALCIUM 9.8  --  9.6  GFRNONAA 55* >60 60*  ANIONGAP 13  --  11     Hematology Recent Labs  Lab 12/17/19 1850 12/18/19 0014  WBC 7.7 8.6  RBC 5.39 5.41  HGB 14.4 14.6  HCT 44.0 43.6  MCV 81.6 80.6  MCH 26.7 27.0  MCHC 32.7 33.5  RDW 13.3 13.2  PLT 142* 153    BNPNo results for input(s): BNP, PROBNP in the last 168 hours.   DDimer No results for input(s): DDIMER in the last 168 hours.   Radiology    DG Chest 2 View  Result Date: 12/17/2019 CLINICAL DATA:  Chest pain on the left EXAM: CHEST - 2 VIEW COMPARISON:  12/01/2019 FINDINGS: The heart size and mediastinal contours are within normal limits. Both lungs are clear. The visualized skeletal structures are unremarkable. IMPRESSION: No active cardiopulmonary disease. Electronically Signed   By: Inez Catalina M.D.   On: 12/17/2019 20:00    Cardiac Studies   No new data  Patient Profile     79 y.o. male recent TAVR 08/15/19 with complex post-procedural course, DM, HTN, CVA,  TIA, sleep apnea, multivessel CAD (medically managed) chronic diastolic CHF,giant cell arteritis,recently diagnosedrenal cell carcinoma (pending nephrectomy),carotid artery disease,pulmonary nodules presents for recurrent pain.   Assessment & Plan    1. Angina pectoris: Whether this is a crescendo pattern or simply angina associated with significant underlying coronary disease needs to be determined.  He has not had an acute event this admission.  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  We will keep n.p.o. for now. 2. Blood pressure is still significantly elevated: Elevated pressures will increase anginal episodes.  We need to be certain that this is under control.  He should ambulate today after his morning medications are given.  We will check on the patient again mid morning to see how he is done with ambulation.  He will walk now with rehab/PT.     For questions or updates, please contact [place] Please consult [email or web address] for contact info under        Signed, Sinclair Grooms, MD  12/19/2019, 8:55 AM

## 2019-12-19 NOTE — Progress Notes (Signed)
   The patient is ambulated several times without chest discomfort.  We discussed sublingual nitroglycerin use, while seated or lying down.  He should use nitroglycerin if chest discomfort lasts for longer than 3 minutes.  If unable to relieve discomfort he should come back to the emergency room.  We discussed compliance with taking his medications in the morning and evenings to control blood pressure.  He does not want to have coronary angiography.  I believe he is stable for discharge.

## 2019-12-27 ENCOUNTER — Encounter: Payer: Self-pay | Admitting: Cardiovascular Disease

## 2019-12-27 NOTE — Progress Notes (Signed)
Cardiology Office Note   Date:  12/28/2019   ID:  [name], [name] 1940/10/21, MRN [id number]  PCP:  Alexis Pao, MD  Cardiologist:   Alexis Moores, MD   Chief Complaint  Patient presents with  . Aortic Stenosis  . Coronary Artery Disease   Problem List  1. Premature ventricular contractions 2. Anxiety 3. Hypertension 4. Hyperlipidemia 5. 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 Bilateral carotid artery disease - moderate 7. 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 TIA 9.  Aortic stenosis:   S/p TAVR      [facility] is a 79 y.o. male who presents for further evaluation of his palpitations and HTN.  He is having some muscle aches - especially in the morning.  Seems to be bilateral shoulder pain. He has held the pravachol for several weeks but the pain did not resolved. BP has been a little high. Tried taking 1 1/2 of the Losartan / HCTZ and has occasionally takes extra metoprolol.  September 20, 2014:  [facility] is doing well.  BP has been well controlled.  Tried a dose of Valsartan and got dizzy, he went back to Losartan after that and has done well.  He needs to have cataract surgery .   Jan. 30, 2017: Doing well  He did not have his cataract surgery as scheduled.    Recent labs look good Aches and pains but otherwise doing ok  Aug. 16, 2017:  Doing ok BP has been well controlled.  Having some left shoulder problems   - hurts at night. 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  No CP ,   Still has occasional elevated HTN  Sept. 25, 2018: Doing well.  Is followed by [facility] for a past CVA and is on Aggrenox ( flagged the ASA dose alert )   Jul 13, 2017:   [facility] is doing well. Is not exercising  Is having some balance issues.   No CP  Still stress in his life.   PVCs seem to be well controlled.  Is taking metoprolol 50 mg TID   April 12, 2018:  No CP , no dyspnea.  Palpitations are under control   May 24, 2019: [facility] is seen today for follow-up of his hypertension, palpitations, mild aortic stenosis, hyperlipidemia, anxiety. Has rare episodes of chest pain - randomly  No exercising regularly at all.    Jul 04, 2019:  [facility] is seen today.  He had complained of chest pain at his last office visit . 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  Ct also showd a 4 mm pulmonary nodule.  He is here to discuss cath .  The cp is on and off.   At times its related to exertion He is not exercising regularly .   Sept. 20, 2021 [facility] is s/p TAVR since I last saw him. Also recent hospitalization for sepsis, syncope   Hx of CAD, AS, HLD, DM, ? Left renal mass ( ? Cancer )   On Aug. 20 he was admitted with syncope.  Had an indwelling foley develped sepsis from urosepsis  Work up also identified pulmonary nodules.  Has an indwelling foley catheter.   Due to BPH, Also has a left renal mass.    Oct. 28, 2021: [facility] is seen for follow up of his recent TAVR. He has CAD. Has been treated medically .  he was recently admitted with cp. He ruled out for MI He was found to be hypertensive - he had skipped several of his meds that AM. Still has foley catheter.  Was at the [facility] recently , was given hydralizine - has never started  Also was changed form losartan  to losartan but he also did not make that change either.     Past Medical History:  Diagnosis Date  . Anxiety   . 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 CAD (coronary artery disease)   . Carotid artery disease ([place])    CTA 08/2019 of the head and neck show moderate 65 to 75% bilateral ICA stenoses   . Chronic diastolic CHF (congestive heart failure) ([place])   . Diabetes mellitus type 2 in nonobese (HCC)   . Excessive urination at night 06/05/2013  . Giant cell arteritis ([place]) 05/17/2012  . Hyperlipidemia   . Hypertension   . Hypertensive urgency   . Hypomagnesemia 09/11/2019  .  Mild anemia 09/11/2019  . NSTEMI (non-ST elevated myocardial infarction) ([place]) 09/09/2019  . Orthostatic hypotension 09/11/2019  . Premature atrial contractions 09/12/2019  . Pulmonary nodules   . PVC (premature ventricular contraction)   . Renal mass   . 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 Burt Knack & Dr. Cyndia Bent  . Severe aortic stenosis   . Sinus bradycardia on ECG   . Sleep apnea   . 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 Wilburn Cornelia.  . Stroke ([place])   . TIA (transient ischemic attack)   . Urinary retention 09/11/2019    Past Surgical History:  Procedure Laterality Date  . CATARACT EXTRACTION, BILATERAL  10/2015, and 11/2015   with adding  TORIC lenses bilaterally   . INTRAVASCULAR PRESSURE WIRE/FFR STUDY N/A 07/07/2019   Procedure: INTRAVASCULAR PRESSURE WIRE/FFR STUDY;  Surgeon: Alexis Man, MD;  Location: [facility];  Service: Cardiovascular;  Laterality: N/A;  . LEFT HEART CATH AND CORONARY ANGIOGRAPHY N/A 07/07/2019   Procedure: LEFT HEART CATH AND CORONARY ANGIOGRAPHY;  Surgeon: Alexis Man, MD;  Location: [facility];  Service: Cardiovascular;  Laterality: N/A;  . SQUAMOUS CELL CARCINOMA EXCISION     tongue  . TEE WITHOUT CARDIOVERSION N/A 08/15/2019   Procedure: TRANSESOPHAGEAL ECHOCARDIOGRAM (TEE);  Surgeon: Sherren Mocha, MD;  Location: [facility];  Service: Open Heart Surgery;  Laterality: N/A;  . TRANSCATHETER AORTIC VALVE REPLACEMENT, TRANSFEMORAL N/A 08/15/2019   Procedure: TRANSCATHETER AORTIC VALVE REPLACEMENT, TRANSFEMORAL using Edwards [employer] SAPIEN 3 Ultra 26 mm THV.;  Surgeon: Sherren Mocha, MD;  Location: [facility];  Service: Open Heart Surgery;  Laterality: N/A;     Current Outpatient Medications  Medication Sig Dispense Refill  . aspirin 81 MG chewable tablet Chew 1 tablet (81 mg total) by mouth daily. 360 tablet 0  .  carboxymethylcellulose (REFRESH PLUS) 0.5 % SOLN Place 1 drop into both eyes every 12 (twelve) hours as needed (irritation).    . clonazePAM (KLONOPIN) 0.5 MG tablet Take 1 tablet (0.5 mg total) by mouth 4 (four) times daily. 12 tablet 0  . FENOFIBRATE PO Take 135 mg by mouth daily.     . finasteride (PROSCAR) 5 MG tablet Take 1 tablet (5 mg total) by mouth daily. 90 tablet 0  . glucose blood test strip 1 each by Other route as needed.     . insulin glargine (LANTUS) 100  UNIT/ML injection Inject 60 Units into the skin daily.    . isosorbide mononitrate (IMDUR) 30 MG 24 hr tablet Take 1 tablet (30 mg total) by mouth daily. 30 tablet 0  . Lancets (ONETOUCH DELICA PLUS [id number]) MISC 1 each by Other route daily.     Marland Kitchen losartan-hydrochlorothiazide (HYZAAR) 100-25 MG tablet Take 1 tablet by mouth daily. 90 tablet 0  . magnesium oxide (MAG-OX) 400 (241.3 Mg) MG tablet Take 1 tablet (400 mg total) by mouth daily. 30 tablet 3  . metoprolol tartrate (LOPRESSOR) 50 MG tablet Take 1 tablet (50 mg total) by mouth 2 (two) times daily. 180 tablet 0  . nitroGLYCERIN (NITROSTAT) 0.4 MG SL tablet Place 1 tablet (0.4 mg total) under the tongue every 5 (five) minutes as needed for chest pain (up to 3 doses). Do not give if blood pressure is low (less than 564 systolic). 12 tablet 0  . ONE TOUCH ULTRA TEST test strip 1 each by Other route daily.     . ranolazine (RANEXA) 500 MG 12 hr tablet Take 1 tablet (500 mg total) by mouth 2 (two) times daily. 60 tablet 1  . rosuvastatin (CRESTOR) 10 MG tablet Take 1 tablet (10 mg total) by mouth daily. 90 tablet 3  . sitaGLIPtin (JANUVIA) 100 MG tablet Take 100 mg by mouth daily.     No current facility-administered medications for this visit.    Allergies:   Macrodantin [nitrofurantoin], Tape, Sulfa antibiotics, Lisinopril, Nutritional supplements, and Penicillins    Social History:  The patient  reports that he quit smoking about 44 years ago. He has never used smokeless  tobacco. He reports that he does not drink alcohol and does not use drugs.   Family History:  The patient's family history includes Heart attack in his father and mother; Heart disease in his father and mother.    ROS:   Noted in current history, otherwise review of systems is negative.   Physical Exam: Blood pressure (!) 132/46, pulse 60, height 5\' 10"  (1.778 m), weight 181 lb 6.4 oz (82.3 kg), SpO2 97 %.  GEN:  Well nourished, well developed in no acute distress HEENT: Normal NECK: No JVD; soft bruits LYMPHATICS: No lymphadenopathy CARDIAC: RRR , soft systolic murmur. RESPIRATORY:  Clear to auscultation without rales, wheezing or rhonchi  ABDOMEN: Soft, non-tender, non-distended MUSCULOSKELETAL:  No edema; No deformity  SKIN: Warm and dry NEUROLOGIC:  Alert and oriented x 3    EKG:     Recent Labs: 09/09/2019: B Natriuretic Peptide 326.2 10/01/2019: TSH 1.472 10/21/2019: ALT 15 12/01/2019: Magnesium 1.8 12/18/2019: BUN 20; Creatinine, Ser 1.16; Hemoglobin 14.6; Platelets 153; Potassium 3.5; Sodium 131    Lipid Panel    Component Value Date/Time   CHOL 130 09/09/2019 0308   CHOL 139 06/14/2019 0959   TRIG 68 09/09/2019 0308   HDL 40 (L) 09/09/2019 0308   HDL 35 (L) 06/14/2019 0959   CHOLHDL 3.3 09/09/2019 0308   VLDL 14 09/09/2019 0308   LDLCALC 76 09/09/2019 0308   LDLCALC 80 06/14/2019 0959      Wt Readings from Last 3 Encounters:  12/28/19 181 lb 6.4 oz (82.3 kg)  12/18/19 175 lb 0.7 oz (79.4 kg)  12/02/19 173 lb 9.6 oz (78.7 kg)      Other studies Reviewed: Additional studies/ records that were reviewed today include: . Review of the above records demonstrates:    ASSESSMENT AND PLAN:  1.  Coronary artery disease:  Stable,   he was  found to have three-vessel coronary artery disease at the time of heart catheterization in May, 2021.  He has a complete occlusion of the ostial right coronary artery with left-to-right collateral filling of the PDA and  posterior lateral branch.  He has a long 70% proximal LAD stenosis and a 75% proximal circumflex stenosis.  He was found to have severe aortic stenosis.    He has had TAVR.  The plan is to continue with medical therapy.  He is not had any recurrent episodes of chest pain recently.  He seems to be doing well.   2.  Aortic stenosis-   Status post TAVR.  Seems to be doing quite well.  3. Hypertension - ,  Blood pressure is well controlled.  He was given hydralazine when he left the [facility] but he has not yet started it yet.  I think that we should continue with his current medications.  We have removed hydralazine from his list.   4. Hyperlipidemia -     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 Bilateral carotid artery disease -      Current medicines are reviewed at length with the patient today.  The patient does not have concerns regarding medicines.  The following changes have been made:  no change   Disposition:     Signed, Alexis Moores, MD  12/28/2019 5:54 PM    [address], [place], [place]  [address] Phone: [phone]; Fax: [phone]

## 2019-12-28 ENCOUNTER — Ambulatory Visit (INDEPENDENT_AMBULATORY_CARE_PROVIDER_SITE_OTHER): Payer: Medicare Other | Admitting: Cardiovascular Disease

## 2019-12-28 ENCOUNTER — Other Ambulatory Visit: Payer: Self-pay

## 2019-12-28 ENCOUNTER — Encounter: Payer: Self-pay | Admitting: Cardiovascular Disease

## 2019-12-28 VITALS — BP 132/46 | HR 60 | Ht 70.0 in | Wt 181.4 lb

## 2019-12-28 DIAGNOSIS — Z952 Presence of prosthetic heart valve: Secondary | ICD-10-CM | POA: Diagnosis not present

## 2019-12-28 DIAGNOSIS — I6523 Occlusion and stenosis of bilateral carotid arteries: Secondary | ICD-10-CM | POA: Diagnosis not present

## 2019-12-28 DIAGNOSIS — I25118 Atherosclerotic heart disease of native coronary artery with other forms of angina pectoris: Secondary | ICD-10-CM | POA: Diagnosis not present

## 2019-12-28 NOTE — Discharge Summary (Signed)
Physician Discharge Summary  [facility] GMW:[id number] DOB: Jul 26, 1940 DOA: 12/01/2019  PCP: Haywood Pao, MD  Admit date: 12/01/2019 Discharge date: 12/02/2019    Recommendations for Outpatient Follow-up:  1. Follow with the primary care provider and cardiology on discharge.  Discharge Diagnoses:  Principal Problem:   Chest pain with high risk for cardiac etiology Active Problems:   Anxiety   Carotid artery disease (HCC)   CAD (coronary artery disease)   Chronic diastolic CHF (congestive heart failure) (HCC)   Diabetes mellitus type 2 in nonobese [facility])   Sleep apnea   S/P TAVR (transcatheter aortic valve replacement)   Urinary retention   Hypertension associated with diabetes ([place])   Hyperlipidemia associated with type 2 diabetes mellitus ([place])   Discharge Condition: Stable  Diet recommendation: Cardiac diet  Filed Weights   12/01/19 1220 12/02/19 0830  Weight: 81.6 kg 78.7 kg   Hospital Course:  Patient is a 79 year old male with past medical history significant for severe AS s/p TAVR 615 2021, multivessel CAD, chronic diastolic CHF (EF 44-03% by TTE 10/02/2019), T2DM, HTN, HLD, history of CVA, CAS, urinary retention with chronic indwelling Foley catheter, suspected left renal cell carcinoma, anxiety on chronic clonazepam, and OSA not using CPAPAS.  Patient was admitted with chest pain.  Cardiology team was consulted to assist with patient's care.  Patient was seen by cardiology team and no cardiac cardiac work-up is planned.  Patient has been cleared for discharge by cardiology team.  Consultations:  Cardiology  Discharge Exam: Vitals:   12/02/19 0756 12/02/19 0830  BP: (!) 174/62 (!) 175/72  Pulse: 64 74  Resp: 17 18  Temp: 98.3 F (36.8 C) 98.4 F (36.9 C)  SpO2: 97% 95%    G    Discharge Instructions    Diet - low sodium heart healthy   Complete by: As directed    Increase activity slowly   Complete by: As directed      Allergies as of  12/02/2019      Reactions   Macrodantin [nitrofurantoin] Other (See Comments)   "blocked kidneys"    Tape Other (See Comments)   "Tears my skin and leaves marks." PLEASE USE COBAN!!   Sulfa Antibiotics Rash   Lisinopril Cough   Nutritional Supplements Other (See Comments)   Protein drink given to me "did something"   Penicillins Other (See Comments)   08/30/19- Patient can't remember, was 35+ years ago. OK with trying cephalexin in hospital      Medication List    STOP taking these medications   Coenzyme Q10 200 MG capsule   polyethylene glycol 17 g packet Commonly known as: MIRALAX / GLYCOLAX     TAKE these medications   aspirin 81 MG chewable tablet Chew 1 tablet (81 mg total) by mouth daily.   carboxymethylcellulose 0.5 % Soln Commonly known as: REFRESH PLUS Place 1 drop into both eyes every 12 (twelve) hours as needed (irritation).   clonazePAM 0.5 MG tablet Commonly known as: KLONOPIN Take 1 tablet (0.5 mg total) by mouth 4 (four) times daily.   FENOFIBRATE PO Take 135 mg by mouth daily.   finasteride 5 MG tablet Commonly known as: PROSCAR Take 1 tablet (5 mg total) by mouth daily.   isosorbide mononitrate 30 MG 24 hr tablet Commonly known as: IMDUR Take 1 tablet (30 mg total) by mouth daily.   losartan-hydrochlorothiazide 100-25 MG tablet Commonly known as: HYZAAR Take 1 tablet by mouth daily.   magnesium oxide 400 (241.3  Mg) MG tablet Commonly known as: MAG-OX Take 1 tablet (400 mg total) by mouth daily. Notes to patient: You have not had this medication today   metoprolol tartrate 50 MG tablet Commonly known as: LOPRESSOR Take 1 tablet (50 mg total) by mouth 2 (two) times daily.   nitroGLYCERIN 0.4 MG SL tablet Commonly known as: NITROSTAT Place 1 tablet (0.4 mg total) under the tongue every 5 (five) minutes as needed for chest pain (up to 3 doses). Do not give if blood pressure is low (less than 782 systolic).   glucose blood test strip 1 each by  Other route as needed.   ONE TOUCH ULTRA TEST test strip Generic drug: glucose blood 1 each by Other route daily.   OneTouch Delica Plus [id number] Misc 1 each by Other route daily.   ranolazine 500 MG 12 hr tablet Commonly known as: RANEXA Take 1 tablet (500 mg total) by mouth 2 (two) times daily.   rosuvastatin 10 MG tablet Commonly known as: CRESTOR Take 1 tablet (10 mg total) by mouth daily.   sitaGLIPtin 100 MG tablet Commonly known as: JANUVIA Take 100 mg by mouth daily. Notes to patient: You have not had this medication today.       Allergies  Allergen Reactions  . Macrodantin [Nitrofurantoin] Other (See Comments)    "blocked kidneys"   . Tape Other (See Comments)    "Tears my skin and leaves marks." PLEASE USE COBAN!!  . Sulfa Antibiotics Rash  . Lisinopril Cough  . Nutritional Supplements Other (See Comments)    Protein drink given to me "did something"  . Penicillins Other (See Comments)    08/30/19- Patient can't remember, was 35+ years ago. OK with trying cephalexin in hospital      The results of significant diagnostics from this hospitalization (including imaging, microbiology, ancillary and laboratory) are listed below for reference.    Significant Diagnostic Studies: DG Chest 2 View  Result Date: 12/17/2019 CLINICAL DATA:  Chest pain on the left EXAM: CHEST - 2 VIEW COMPARISON:  12/01/2019 FINDINGS: The heart size and mediastinal contours are within normal limits. Both lungs are clear. The visualized skeletal structures are unremarkable. IMPRESSION: No active cardiopulmonary disease. Electronically Signed   By: Inez Catalina M.D.   On: 12/17/2019 20:00   DG Chest 2 View  Result Date: 12/01/2019 CLINICAL DATA:  Chest pain EXAM: CHEST - 2 VIEW COMPARISON:  10/20/2019 FINDINGS: Aortic valve replacement. The heart size and mediastinal contours are within normal limits. Atherosclerotic calcification of the aortic knob. Both lungs are clear. The visualized  skeletal structures are unremarkable. IMPRESSION: No active cardiopulmonary disease. Electronically Signed   By: Davina Poke D.O.   On: 12/01/2019 12:55    Microbiology: Recent Results (from the past 240 hour(s))  MRSA PCR Screening     Status: None   Collection Time: 12/19/19 12:02 AM   Specimen: Nasal Mucosa; Nasopharyngeal  Result Value Ref Range Status   MRSA by PCR NEGATIVE NEGATIVE Final    Comment:        The GeneXpert MRSA Assay (FDA approved for NASAL specimens only), is one component of a comprehensive MRSA colonization surveillance program. It is not intended to diagnose MRSA infection nor to guide or monitor treatment for MRSA infections. Performed at [facility], [place] [address] [place], [place] [address]      Labs: Basic Metabolic Panel: No results for input(s): NA, K, CL, CO2, GLUCOSE, BUN, CREATININE, CALCIUM, MG, PHOS in the last  168 hours. Liver Function Tests: No results for input(s): AST, ALT, ALKPHOS, BILITOT, PROT, ALBUMIN in the last 168 hours. No results for input(s): LIPASE, AMYLASE in the last 168 hours. No results for input(s): AMMONIA in the last 168 hours. CBC: No results for input(s): WBC, NEUTROABS, HGB, HCT, MCV, PLT in the last 168 hours. Cardiac Enzymes: No results for input(s): CKTOTAL, CKMB, CKMBINDEX, TROPONINI in the last 168 hours. BNP: BNP (last 3 results) Recent Labs    08/11/19 0844 09/08/19 2139 09/09/19 0308  BNP 238.5* 222.2* 326.2*    ProBNP (last 3 results) No results for input(s): PROBNP in the last 8760 hours.  CBG: No results for input(s): GLUCAP in the last 168 hours.     Signed:  Dana Allan, MD  [facility] Pager #: [phone] 7PM-7AM contact night coverage as above

## 2019-12-28 NOTE — Patient Instructions (Signed)
Medication Instructions:  Your physician recommends that you continue on your current medications as directed. Please refer to the Current Medication list given to you today.  *If you need a refill on your cardiac medications before your next appointment, please call your pharmacy*   Lab Work: None Ordered If you have labs (blood work) drawn today and your tests are completely normal, you will receive your results only by: Marland Kitchen MyChart Message (if you have MyChart) OR . A paper copy in the mail If you have any lab test that is abnormal or we need to change your treatment, we will call you to review the results.   Testing/Procedures: None Ordered    Follow-Up: At [facility], you and your health needs are our priority.  As part of our continuing mission to provide you with exceptional heart care, we have created designated Provider Care Teams.  These Care Teams include your primary Cardiologist (physician) and Advanced Practice Providers (APPs -  Physician Assistants and Nurse Practitioners) who all work together to provide you with the care you need, when you need it.   Your next appointment:   2 month(s) on Dec. 21 at 1:45 pm  The format for your next appointment:   In Person  Provider:   Richardson Dopp, PA-C

## 2020-01-01 ENCOUNTER — Telehealth: Payer: Self-pay | Admitting: Cardiovascular Disease

## 2020-01-01 ENCOUNTER — Other Ambulatory Visit: Payer: Self-pay | Admitting: Cardiovascular Disease

## 2020-01-02 ENCOUNTER — Telehealth: Payer: Self-pay

## 2020-01-02 ENCOUNTER — Other Ambulatory Visit: Payer: Self-pay | Admitting: Cardiovascular Disease

## 2020-01-02 MED ORDER — LOSARTAN POTASSIUM-HCTZ 100-25 MG PO TABS
1.0000 | ORAL_TABLET | Freq: Every day | ORAL | 1 refills | Status: DC
Start: 2020-01-02 — End: 2020-01-22

## 2020-01-02 NOTE — Telephone Encounter (Signed)
[facility], [phone], is requesting clarification for medication Losartan-HCTZ. Pharmacy states that pt says Dr. Acie Fredrickson told him to continue this medication, but Dr. Osborne Casco, PCP, prescribed plain Losartan 100 mg. Pharmacy would like to know which medication is correct? Please address

## 2020-01-02 NOTE — Telephone Encounter (Signed)
Pt is prescribed to take Hyzaar 100-25 mg po daily, which is what Dr. Osborne Casco and Dr. Acie Fredrickson both agreed and advised him to continue taking.  He is not taking plain losartan.  Per discharge note from the hospital and from Dr. Elmarie Shiley last OV note on the pt, he is to take hyzaar 100/25 mg po daily.  Pt needs a refill of this medication sent to his confirmed pharmacy of choice.  Sent this refill in for 90 days.  Will also make [facility] aware of this as well.  Pt verbalized understanding and agrees with this plan. Pt was more than gracious for all the assistance for all the assistance provided.   [facility] and made them aware of refill sent. They will be filling this for the pt now.

## 2020-01-02 NOTE — Telephone Encounter (Signed)
Pt is prescribed to take Hyzaar 100-25 mg po daily, which is what Dr. Osborne Casco and Dr. Acie Fredrickson both agreed and advised him to continue taking.  He is not taking plain losartan.  Per discharge note from the hospital and from Dr. Elmarie Shiley last OV note on the pt, he is to take hyzaar 100/25 mg po daily.  Pt needs a refill of this medication sent to his confirmed pharmacy of choice.  Sent this refill in for 90 days.  Will also make [facility] aware of this as well.  Pt verbalized understanding and agrees with this plan. Pt was more than gracious for all the assistance for all the assistance provided.   [facility] and made them aware of refill sent. They will be filling this for the pt now.

## 2020-01-02 NOTE — Telephone Encounter (Signed)
Pt called in stated that he had a visit with Dr Cathie Olden last week and Dr Cathie Olden told him that he needed to stay on this med.  The last script he rec'd for this med was when he was in the hosp.  He would like to know if Dr Cathie Olden is going to take over refilling this med?    Best number [phone]

## 2020-01-17 ENCOUNTER — Telehealth: Payer: Self-pay | Admitting: *Deleted

## 2020-01-17 NOTE — Telephone Encounter (Signed)
DOD call from PA with urology. Dr. Rayann Heman wants to have appointment scheduled with Dr. Acie Fredrickson, app or DOD spot if others are not available this week or beginning of next week for increasing edema over the last week.

## 2020-01-21 ENCOUNTER — Encounter: Payer: Self-pay | Admitting: Cardiovascular Disease

## 2020-01-21 NOTE — Progress Notes (Signed)
Cardiology Office Note   Date:  01/22/2020   ID:  Alexis Griffith, Alexis Griffith [birth year], MRN [id number]  PCP:  Haywood Pao, MD  Cardiologist:   Mertie Moores, MD   Chief Complaint  Patient presents with  . Coronary Artery Disease  . Congestive Heart Failure  . Hypertension   Problem List  1. Premature ventricular contractions 2. Anxiety 3. Hypertension 4. Hyperlipidemia 5. 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 Bilateral carotid artery disease - moderate 7. 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 TIA 9.  Aortic stenosis:   S/p TAVR      Alexis Griffith is a 79 y.o. male who presents for further evaluation of his palpitations and HTN.  He is having some muscle aches - especially in the morning.  Seems to be bilateral shoulder pain. He has held the pravachol for several weeks but the pain did not resolved. BP has been a little high. Tried taking 1 1/2 of the Losartan / HCTZ and has occasionally takes extra metoprolol.  September 20, 2014:  Alexis Griffith is doing well.  BP has been well controlled.  Tried a dose of Valsartan and got dizzy, he went back to Losartan after that and has done well.  He needs to have cataract surgery .   Jan. 30, 2017: Doing well  He did not have his cataract surgery as scheduled.    Recent labs look good Aches and pains but otherwise doing ok  Aug. 16, 2017:  Doing ok BP has been well controlled.  Having some left shoulder problems   - hurts at night. 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  No CP ,   Still has occasional elevated HTN  Sept. 25, 2018: Doing well.  Is followed by Alexis Griffith for a past CVA and is on Aggrenox ( flagged the ASA dose alert )   Jul 13, 2017:   Alexis Griffith is doing well. Is not exercising  Is having some balance issues.   No CP  Still stress in his life.   PVCs seem to be well controlled.  Is taking metoprolol 50  mg TID   April 12, 2018:  No CP , no dyspnea.  Palpitations are under control   May 24, 2019: Alexis Griffith is seen today for follow-up of his hypertension, palpitations, mild aortic stenosis, hyperlipidemia, anxiety. Has rare episodes of chest pain - randomly  No exercising regularly at all.    Jul 04, 2019:  Alexis Griffith is seen today.  He had complained of chest pain at his last office visit . 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  Ct also showd a 4 mm pulmonary nodule.  He is here to discuss cath .  The cp is on and off.   At times its related to exertion He is not exercising regularly .   Sept. 20, 2021 Alexis Griffith is s/p TAVR since I last saw him. Also recent hospitalization for sepsis, syncope   Hx of CAD, AS, HLD, DM, ? Left renal mass ( ? Cancer )   On Aug. 20 he was admitted with syncope.  Had an indwelling foley develped sepsis from urosepsis  Work up also identified pulmonary nodules.  Has an indwelling foley catheter.   Due to BPH, Also has a left renal mass.    Oct. 28, 2021: Alexis Griffith is seen for follow up of his recent TAVR. He has CAD. Has  been treated medically .   he was recently admitted with cp. He ruled out for MI He was found to be hypertensive - he had skipped several of his meds that AM. Still has foley catheter.  Was at the hospital recently , was given hydralizine - has never started  Also was changed form losartan  to losartan but he also did not make that change either.  Nov. 22, 2021: Alexis Griffith is seen for recent worsening of leg edema Hx of CAD, CHF, TAVR, renal cell CA His BP has been on the low side Will DC Losartan HCT and have him take Losartan 100 mg a day goig forward Will give him a separate script for HCTZ at some point but only if he needs it     Past Medical History:  Diagnosis Date  . Anxiety   . 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 CAD (coronary artery disease)   . Carotid artery disease ([place])    CTA 08/2019 of the head and neck show moderate 65 to  75% bilateral ICA stenoses   . Chronic diastolic CHF (congestive heart failure) ([place])   . Diabetes mellitus type 2 in nonobese (HCC)   . Excessive urination at night 06/05/2013  . Giant cell arteritis ([place]) 05/17/2012  . Hyperlipidemia   . Hypertension   . Hypertensive urgency   . Hypomagnesemia 09/11/2019  . Mild anemia 09/11/2019  . NSTEMI (non-ST elevated myocardial infarction) ([place]) 09/09/2019  . Orthostatic hypotension 09/11/2019  . Premature atrial contractions 09/12/2019  . Pulmonary nodules   . PVC (premature ventricular contraction)   . Renal mass   . 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 Burt Knack & Dr. Cyndia Bent  . Severe aortic stenosis   . Sinus bradycardia on ECG   . Sleep apnea   . Squamous cell carcinoma of tongue [facility]) September 2012   Followed by Dr. Wilburn Cornelia.  . Stroke ([place])   . TIA (transient ischemic attack)   . Urinary retention 09/11/2019    Past Surgical History:  Procedure Laterality Date  . CATARACT EXTRACTION, BILATERAL  10/2015, and 11/2015   with adding  TORIC lenses bilaterally   . INTRAVASCULAR PRESSURE WIRE/FFR STUDY N/A 07/07/2019   Procedure: INTRAVASCULAR PRESSURE WIRE/FFR STUDY;  Surgeon: Alexis Man, MD;  Location: [place] [facility];  Service: Cardiovascular;  Laterality: N/A;  . LEFT HEART CATH AND CORONARY ANGIOGRAPHY N/A 07/07/2019   Procedure: LEFT HEART CATH AND CORONARY ANGIOGRAPHY;  Surgeon: Alexis Man, MD;  Location: [place] [facility];  Service: Cardiovascular;  Laterality: N/A;  . SQUAMOUS CELL CARCINOMA EXCISION     tongue  . TEE WITHOUT CARDIOVERSION N/A 08/15/2019   Procedure: TRANSESOPHAGEAL ECHOCARDIOGRAM (TEE);  Surgeon: Sherren Mocha, MD;  Location: [facility] [place] [facility];  Service: Open Heart Surgery;  Laterality: N/A;  . TRANSCATHETER AORTIC VALVE REPLACEMENT, TRANSFEMORAL N/A 08/15/2019   Procedure: TRANSCATHETER AORTIC VALVE REPLACEMENT, TRANSFEMORAL using Edwards  [employer] SAPIEN 3 Ultra 26 mm THV.;  Surgeon: Sherren Mocha, MD;  Location: [facility];  Service: Open Heart Surgery;  Laterality: N/A;     Current Outpatient Medications  Medication Sig Dispense Refill  . aspirin 81 MG chewable tablet Chew 1 tablet (81 mg total) by mouth daily. 360 tablet 0  . carboxymethylcellulose (REFRESH PLUS) 0.5 % SOLN Place 1 drop into both eyes every 12 (twelve) hours as needed (irritation).    . clonazePAM (KLONOPIN) 0.5 MG tablet Take 0.5  mg by mouth daily.    . FENOFIBRATE PO Take 135 mg by mouth daily.     Marland Kitchen glucose blood test strip 1 each by Other route as needed.     . insulin glargine (LANTUS) 100 UNIT/ML injection Inject 60 Units into the skin daily.    . isosorbide mononitrate (IMDUR) 30 MG 24 hr tablet Take 1 tablet (30 mg total) by mouth daily. 30 tablet 0  . Lancets (ONETOUCH DELICA PLUS [id number]) MISC 1 each by Other route daily.     Marland Kitchen losartan-hydrochlorothiazide (HYZAAR) 100-25 MG tablet Take 1 tablet by mouth daily. 90 tablet 1  . magnesium oxide (MAG-OX) 400 (241.3 Mg) MG tablet Take 1 tablet (400 mg total) by mouth daily. 30 tablet 3  . metoprolol tartrate (LOPRESSOR) 50 MG tablet Take 1 tablet (50 mg total) by mouth 2 (two) times daily. 180 tablet 0  . nitroGLYCERIN (NITROSTAT) 0.4 MG SL tablet Place 1 tablet (0.4 mg total) under the tongue every 5 (five) minutes as needed for chest pain (up to 3 doses). Do not give if blood pressure is low (less than 364 systolic). 12 tablet 0  . ONE TOUCH ULTRA TEST test strip 1 each by Other route daily.     . ranolazine (RANEXA) 500 MG 12 hr tablet Take 1 tablet (500 mg total) by mouth 2 (two) times daily. 60 tablet 1  . rosuvastatin (CRESTOR) 10 MG tablet Take 1 tablet (10 mg total) by mouth daily. 90 tablet 3  . sitaGLIPtin (JANUVIA) 100 MG tablet Take 100 mg by mouth daily.     No current facility-administered medications for this visit.    Allergies:   Macrodantin [nitrofurantoin], Tape, Sulfa  antibiotics, Lisinopril, Nutritional supplements, and Penicillins    Social History:  The patient  reports that he quit smoking about 44 years ago. He has never used smokeless tobacco. He reports that he does not drink alcohol and does not use drugs.   Family History:  The patient's family history includes Heart attack in his father and mother; Heart disease in his father and mother.    ROS:   Noted in current history, otherwise review of systems is negative.   Physical Exam: Blood pressure (!) 112/42, pulse 62, height 5\' 10"  (1.778 m), weight 180 lb 6.4 oz (81.8 kg), SpO2 97 %.  GEN:  Well nourished, well developed in no acute distress HEENT: Normal NECK: No JVD; No carotid bruits LYMPHATICS: No lymphadenopathy CARDIAC: RRR , sft systolic murmur RESPIRATORY:  Clear to auscultation without rales, wheezing or rhonchi  ABDOMEN: Soft, non-tender, non-distended MUSCULOSKELETAL:  No edema  No deformity  SKIN: Warm and dry NEUROLOGIC:  Alert and oriented x 3   EKG:     Recent Labs: 09/09/2019: B Natriuretic Peptide 326.2 10/01/2019: TSH 1.472 10/21/2019: ALT 15 12/01/2019: Magnesium 1.8 12/18/2019: BUN 20; Creatinine, Ser 1.16; Hemoglobin 14.6; Platelets 153; Potassium 3.5; Sodium 131    Lipid Panel    Component Value Date/Time   CHOL 130 09/09/2019 0308   CHOL 139 06/14/2019 0959   TRIG 68 09/09/2019 0308   HDL 40 (L) 09/09/2019 0308   HDL 35 (L) 06/14/2019 0959   CHOLHDL 3.3 09/09/2019 0308   VLDL 14 09/09/2019 0308   LDLCALC 76 09/09/2019 0308   LDLCALC 80 06/14/2019 0959      Wt Readings from Last 3 Encounters:  01/22/20 180 lb 6.4 oz (81.8 kg)  12/28/19 181 lb 6.4 oz (82.3 kg)  12/18/19 175 lb 0.7 oz (79.4  kg)      Other studies Reviewed: Additional studies/ records that were reviewed today include: . Review of the above records demonstrates:    ASSESSMENT AND PLAN:  1.  Coronary artery disease:   No angina .  Has not been able to swallow his  Ranexa. Suggested that he try taking it with yogurt or apple sauce.     2.  Aortic stenosis-  S/p TAVR , doing well   3. Hypertension - BP is actually on the low side.   Will DC losartan - HCT and give him Losartan 100 mg tabs.   Will prescribe a separate prescription for HCTZ if he needs it     4. Hyperlipidemia -   Stable    5. 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 Bilateral carotid artery disease -  Stable     Current medicines are reviewed at length with the patient today.  The patient does not have concerns regarding medicines.  The following changes have been made:  no change   Disposition:     Signed, Mertie Moores, MD  01/22/2020 1:53 PM    [facility] [address], [place], [place]  [address] Phone: [phone]; Fax: [phone]

## 2020-01-22 ENCOUNTER — Encounter: Payer: Self-pay | Admitting: Cardiovascular Disease

## 2020-01-22 ENCOUNTER — Other Ambulatory Visit: Payer: Self-pay

## 2020-01-22 ENCOUNTER — Ambulatory Visit (INDEPENDENT_AMBULATORY_CARE_PROVIDER_SITE_OTHER): Payer: Medicare Other | Admitting: Cardiovascular Disease

## 2020-01-22 VITALS — BP 112/42 | HR 62 | Ht 70.0 in | Wt 180.4 lb

## 2020-01-22 DIAGNOSIS — I10 Essential (primary) hypertension: Secondary | ICD-10-CM

## 2020-01-22 DIAGNOSIS — I350 Nonrheumatic aortic (valve) stenosis: Secondary | ICD-10-CM

## 2020-01-22 DIAGNOSIS — I493 Ventricular premature depolarization: Secondary | ICD-10-CM | POA: Diagnosis not present

## 2020-01-22 DIAGNOSIS — Z952 Presence of prosthetic heart valve: Secondary | ICD-10-CM | POA: Diagnosis not present

## 2020-01-22 DIAGNOSIS — I25118 Atherosclerotic heart disease of native coronary artery with other forms of angina pectoris: Secondary | ICD-10-CM | POA: Diagnosis not present

## 2020-01-22 MED ORDER — LOSARTAN POTASSIUM 100 MG PO TABS: 100.0000 mg | ORAL_TABLET | Freq: Every day | ORAL | 3 refills | Status: DC

## 2020-01-22 NOTE — Patient Instructions (Addendum)
Medication Instructions:  Your physician has recommended you make the following change in your medication:  STOP Losartan/HCTZ (Hyzaar) START Losartan 100 mg once daily  *If you need a refill on your cardiac medications before your next appointment, please call your pharmacy*   Lab Work: Your physician recommends that you return for lab work in: 3 weeks for basic metabolic panel on Monday Dec. 13 You do not have to fast for this appointment. You may come in anytime between 7:30 am and 4:30 pm  If you have labs (blood work) drawn today and your tests are completely normal, you will receive your results only by: Marland Kitchen MyChart Message (if you have MyChart) OR . A paper copy in the mail If you have any lab test that is abnormal or we need to change your treatment, we will call you to review the results.   Testing/Procedures: None Ordered   Follow-Up: At [facility], you and your health needs are our priority.  As part of our continuing mission to provide you with exceptional heart care, we have created designated Provider Care Teams.  These Care Teams include your primary Cardiologist (physician) and Advanced Practice Providers (APPs -  Physician Assistants and Nurse Practitioners) who all work together to provide you with the care you need, when you need it.   Your next appointment:   3 month(s) on Friday March 4 at 9:15 am   The format for your next appointment:   In Person  Provider:   Richardson Dopp, PA-C

## 2020-01-30 ENCOUNTER — Other Ambulatory Visit: Payer: Self-pay | Admitting: Cardiovascular Disease

## 2020-01-30 ENCOUNTER — Ambulatory Visit: Payer: Medicare Other | Admitting: Cardiovascular Disease

## 2020-02-05 ENCOUNTER — Other Ambulatory Visit: Payer: Self-pay

## 2020-02-05 MED ORDER — NITROGLYCERIN 0.4 MG SL SUBL: 0.4000 mg | SUBLINGUAL_TABLET | SUBLINGUAL | 7 refills | Status: DC | PRN

## 2020-02-08 ENCOUNTER — Telehealth: Payer: Self-pay | Admitting: Cardiovascular Disease

## 2020-02-08 NOTE — Telephone Encounter (Signed)
Alexis Griffith is calling wanting to know if his BMET results have been received from his PCP. Please advise.

## 2020-02-08 NOTE — Telephone Encounter (Signed)
Per pt just had labs done yesterday 02/07/20 at Dr Osborne Casco office  and values were all good .Will await results to be faxed to Dr Acie Fredrickson per pt's request .Adonis Housekeeper

## 2020-02-12 ENCOUNTER — Other Ambulatory Visit: Payer: Medicare Other

## 2020-02-14 ENCOUNTER — Ambulatory Visit: Payer: Medicare Other | Admitting: Physician Assistant

## 2020-02-20 ENCOUNTER — Ambulatory Visit: Payer: Medicare Other | Admitting: Physician Assistant

## 2020-02-29 ENCOUNTER — Telehealth: Payer: Self-pay | Admitting: Physician Assistant

## 2020-02-29 ENCOUNTER — Ambulatory Visit (INDEPENDENT_AMBULATORY_CARE_PROVIDER_SITE_OTHER): Payer: Medicare Other | Admitting: Nurse Practitioner

## 2020-02-29 ENCOUNTER — Other Ambulatory Visit: Payer: Self-pay

## 2020-02-29 VITALS — BP 140/66 | HR 66 | Ht 70.0 in | Wt 186.2 lb

## 2020-02-29 DIAGNOSIS — Z952 Presence of prosthetic heart valve: Secondary | ICD-10-CM | POA: Diagnosis not present

## 2020-02-29 DIAGNOSIS — R002 Palpitations: Secondary | ICD-10-CM

## 2020-02-29 DIAGNOSIS — I10 Essential (primary) hypertension: Secondary | ICD-10-CM | POA: Diagnosis not present

## 2020-02-29 MED ORDER — ISOSORBIDE MONONITRATE ER 30 MG PO TB24
30.0000 mg | ORAL_TABLET | Freq: Every day | ORAL | 3 refills | Status: DC
Start: 2020-02-29 — End: 2020-05-07

## 2020-02-29 MED ORDER — METOPROLOL TARTRATE 50 MG PO TABS
50.0000 mg | ORAL_TABLET | Freq: Two times a day (BID) | ORAL | 3 refills | Status: DC
Start: 2020-02-29 — End: 2020-03-29

## 2020-02-29 NOTE — Telephone Encounter (Signed)
Patient returning call.

## 2020-02-29 NOTE — Telephone Encounter (Signed)
Patient is calling to see if he can get in in contact with [facility]. Says that he has started having PVC's again at a regular pace. Please call back

## 2020-02-29 NOTE — Patient Instructions (Addendum)
Medication Instructions:  Your physician recommends that you continue on your current medications as directed. Please refer to the Current Medication list given to you today. **Check your medication list and make certain you are taking medications as prescribed  *If you need a refill on your cardiac medications before your next appointment, please call your pharmacy*   Lab Work: None Ordered If you have labs (blood work) drawn today and your tests are completely normal, you will receive your results only by: Marland Kitchen MyChart Message (if you have MyChart) OR . A paper copy in the mail If you have any lab test that is abnormal or we need to change your treatment, we will call you to review the results.   Testing/Procedures: HOW TO TAKE YOUR BLOOD PRESSURE:  Rest 5 minutes before taking your blood pressure.   Don't smoke or drink caffeinated beverages for at least 30 minutes before.  Take your blood pressure before (not after) you eat.  Sit comfortably with your back supported and both feet on the floor (don't cross your legs).  Elevate your arm to heart level on a table or a desk.  Use the proper sized cuff. It should fit smoothly and snugly around your bare upper arm. There should be enough room to slip a fingertip under the cuff. The bottom edge of the cuff should be 1 inch above the crease of the elbow.  Only check your BP two times per day unless you feel that it is abnormal - approximately 2 hours after you take your medications    Follow-Up: At [facility], you and your health needs are our priority.  As part of our continuing mission to provide you with exceptional heart care, we have created designated Provider Care Teams.  These Care Teams include your primary Cardiologist (physician) and Advanced Practice Providers (APPs -  Physician Assistants and Nurse Practitioners) who all work together to provide you with the care you need, when you need it.   Your next appointment:   3  month(s) on March 8 at 12:15 pm  The format for your next appointment:   In Person  Provider:   Tereso Newcomer, PA-C

## 2020-02-29 NOTE — Telephone Encounter (Signed)
Spoke with patient who states he feels like over the past couple of days he is having extra heart beats without stopping. He states it feels different than in the past. Reports heart fluttering in chest, denies SOB or chest pressure. I advised him to come in today for EKG to evaluate for a fib. He verbalized understanding and agreement and thanked me for the call.

## 2020-02-29 NOTE — Progress Notes (Signed)
1.) Reason for visit: palpitations  2.) Name of MD requesting visit: Dr. Lenora Boys, RN  3.) H&P: Received call from patient today with concerns for irregular heart rate. He states this HR feels different than previous hx of PVCs. Complains of fluttering in chest and feeling anxious. Denies SOB or chest discomfort. States he worries a lot about his condition and what may be wrong with him.   4.) ROS related to problem: Patient presents in NAD with his wife who states he monitors his BP multiple times per day and gets anxious when it is elevated. He admits he may have taken additional losartan or metoprolol on occasion. Advised him that max dose of losartan is 100 mg daily. Gave him a copy of medication list and advised him to call back if he is taking additional or different doses of any of his medications. He admits to being anxious about his health and things that are happening in the world. Encouraged him to reach out to his PCP for anti-anxiety medication that is not habit-forming. He takes clonazepam 0.5 mg daily which was reduced from prior dose of up to 4 times per day. He states Dr. Wylene Simmer, PCP, was concerned about falls with high dose of clonazepam. Advised that there are other non-habit forming medications that may be safer and more effective for his anxiety. He verbalized understanding and agreement and will reach out to Dr. Wylene Simmer.   5.) Assessment and plan per MD: EKG reviewed by Dr. Elberta Fortis, DOD, NSR. Advised patient to continue current medications and keep follow-up appointment for March with Tereso Newcomer, PA.

## 2020-03-27 ENCOUNTER — Telehealth: Payer: Self-pay | Admitting: Cardiovascular Disease

## 2020-03-27 ENCOUNTER — Inpatient Hospital Stay (HOSPITAL_COMMUNITY)
Admission: EM | Admit: 2020-03-27 | Discharge: 2020-03-29 | DRG: 641 | Disposition: A | Discharge status: Home / Self Care | Payer: Medicare Other | Attending: Family Medicine | Admitting: Family Medicine

## 2020-03-27 ENCOUNTER — Emergency Department (HOSPITAL_COMMUNITY): Payer: Medicare Other

## 2020-03-27 ENCOUNTER — Encounter (HOSPITAL_COMMUNITY): Payer: Self-pay | Admitting: Emergency Medicine

## 2020-03-27 DIAGNOSIS — E860 Dehydration: Secondary | ICD-10-CM | POA: Diagnosis not present

## 2020-03-27 DIAGNOSIS — Z20822 Contact with and (suspected) exposure to covid-19: Secondary | ICD-10-CM | POA: Diagnosis present

## 2020-03-27 DIAGNOSIS — I951 Orthostatic hypotension: Secondary | ICD-10-CM | POA: Diagnosis present

## 2020-03-27 DIAGNOSIS — Z952 Presence of prosthetic heart valve: Secondary | ICD-10-CM

## 2020-03-27 DIAGNOSIS — R809 Proteinuria, unspecified: Secondary | ICD-10-CM | POA: Diagnosis present

## 2020-03-27 DIAGNOSIS — Z880 Allergy status to penicillin: Secondary | ICD-10-CM

## 2020-03-27 DIAGNOSIS — Z7982 Long term (current) use of aspirin: Secondary | ICD-10-CM

## 2020-03-27 DIAGNOSIS — Z882 Allergy status to sulfonamides status: Secondary | ICD-10-CM

## 2020-03-27 DIAGNOSIS — R918 Other nonspecific abnormal finding of lung field: Secondary | ICD-10-CM | POA: Diagnosis present

## 2020-03-27 DIAGNOSIS — E119 Type 2 diabetes mellitus without complications: Secondary | ICD-10-CM

## 2020-03-27 DIAGNOSIS — G4730 Sleep apnea, unspecified: Secondary | ICD-10-CM | POA: Diagnosis present

## 2020-03-27 DIAGNOSIS — F419 Anxiety disorder, unspecified: Secondary | ICD-10-CM | POA: Diagnosis present

## 2020-03-27 DIAGNOSIS — Z87891 Personal history of nicotine dependence: Secondary | ICD-10-CM

## 2020-03-27 DIAGNOSIS — I455 Other specified heart block: Secondary | ICD-10-CM | POA: Diagnosis present

## 2020-03-27 DIAGNOSIS — R0789 Other chest pain: Secondary | ICD-10-CM | POA: Diagnosis not present

## 2020-03-27 DIAGNOSIS — I130 Hypertensive heart and chronic kidney disease with heart failure and stage 1 through stage 4 chronic kidney disease, or unspecified chronic kidney disease: Secondary | ICD-10-CM | POA: Diagnosis present

## 2020-03-27 DIAGNOSIS — N1831 Chronic kidney disease, stage 3a: Secondary | ICD-10-CM | POA: Diagnosis present

## 2020-03-27 DIAGNOSIS — I2510 Atherosclerotic heart disease of native coronary artery without angina pectoris: Secondary | ICD-10-CM | POA: Diagnosis present

## 2020-03-27 DIAGNOSIS — Z8673 Personal history of transient ischemic attack (TIA), and cerebral infarction without residual deficits: Secondary | ICD-10-CM

## 2020-03-27 DIAGNOSIS — Z8249 Family history of ischemic heart disease and other diseases of the circulatory system: Secondary | ICD-10-CM

## 2020-03-27 DIAGNOSIS — I5032 Chronic diastolic (congestive) heart failure: Secondary | ICD-10-CM | POA: Diagnosis present

## 2020-03-27 DIAGNOSIS — R55 Syncope and collapse: Secondary | ICD-10-CM | POA: Diagnosis not present

## 2020-03-27 DIAGNOSIS — Z888 Allergy status to other drugs, medicaments and biological substances status: Secondary | ICD-10-CM

## 2020-03-27 DIAGNOSIS — Z85810 Personal history of malignant neoplasm of tongue: Secondary | ICD-10-CM

## 2020-03-27 DIAGNOSIS — F40240 Claustrophobia: Secondary | ICD-10-CM | POA: Diagnosis present

## 2020-03-27 DIAGNOSIS — R0989 Other specified symptoms and signs involving the circulatory and respiratory systems: Secondary | ICD-10-CM | POA: Diagnosis present

## 2020-03-27 DIAGNOSIS — Z79899 Other long term (current) drug therapy: Secondary | ICD-10-CM

## 2020-03-27 DIAGNOSIS — C649 Malignant neoplasm of unspecified kidney, except renal pelvis: Secondary | ICD-10-CM | POA: Diagnosis present

## 2020-03-27 DIAGNOSIS — I252 Old myocardial infarction: Secondary | ICD-10-CM

## 2020-03-27 DIAGNOSIS — Z953 Presence of xenogenic heart valve: Secondary | ICD-10-CM

## 2020-03-27 DIAGNOSIS — I25118 Atherosclerotic heart disease of native coronary artery with other forms of angina pectoris: Secondary | ICD-10-CM | POA: Diagnosis not present

## 2020-03-27 DIAGNOSIS — E1165 Type 2 diabetes mellitus with hyperglycemia: Secondary | ICD-10-CM | POA: Diagnosis present

## 2020-03-27 DIAGNOSIS — Z91048 Other nonmedicinal substance allergy status: Secondary | ICD-10-CM

## 2020-03-27 DIAGNOSIS — E785 Hyperlipidemia, unspecified: Secondary | ICD-10-CM | POA: Diagnosis present

## 2020-03-27 DIAGNOSIS — Z794 Long term (current) use of insulin: Secondary | ICD-10-CM

## 2020-03-27 DIAGNOSIS — E1122 Type 2 diabetes mellitus with diabetic chronic kidney disease: Secondary | ICD-10-CM | POA: Diagnosis present

## 2020-03-27 DIAGNOSIS — R778 Other specified abnormalities of plasma proteins: Secondary | ICD-10-CM | POA: Diagnosis present

## 2020-03-27 LAB — URINALYSIS, ROUTINE W REFLEX MICROSCOPIC
Bacteria, UA: NONE SEEN
Bilirubin Urine: NEGATIVE
Glucose, UA: 150 mg/dL — AB
Hgb urine dipstick: NEGATIVE
Ketones, ur: NEGATIVE mg/dL
Leukocytes,Ua: NEGATIVE
Nitrite: NEGATIVE
Protein, ur: 100 mg/dL — AB
Specific Gravity, Urine: 1.005 (ref 1.005–1.030)
pH: 7 (ref 5.0–8.0)

## 2020-03-27 LAB — CBC
HCT: 44.1 % (ref 39.0–52.0)
Hemoglobin: 15.1 g/dL (ref 13.0–17.0)
MCH: 28.9 pg (ref 26.0–34.0)
MCHC: 34.2 g/dL (ref 30.0–36.0)
MCV: 84.5 fL (ref 80.0–100.0)
Platelets: 160 10*3/uL (ref 150–400)
RBC: 5.22 MIL/uL (ref 4.22–5.81)
RDW: 13.6 % (ref 11.5–15.5)
WBC: 6.3 10*3/uL (ref 4.0–10.5)
nRBC: 0 % (ref 0.0–0.2)

## 2020-03-27 LAB — BASIC METABOLIC PANEL
Anion gap: 12 (ref 5–15)
BUN: 22 mg/dL (ref 8–23)
CO2: 23 mmol/L (ref 22–32)
Calcium: 9.8 mg/dL (ref 8.9–10.3)
Chloride: 96 mmol/L — ABNORMAL LOW (ref 98–111)
Creatinine, Ser: 1.26 mg/dL — ABNORMAL HIGH (ref 0.61–1.24)
GFR, Estimated: 58 mL/min — ABNORMAL LOW (ref >60)
Glucose, Bld: 266 mg/dL — ABNORMAL HIGH (ref 70–99)
Potassium: 3.8 mmol/L (ref 3.5–5.1)
Sodium: 131 mmol/L — ABNORMAL LOW (ref 135–145)

## 2020-03-27 LAB — CBG MONITORING, ED: Glucose-Capillary: 133 mg/dL — ABNORMAL HIGH (ref 70–99)

## 2020-03-27 LAB — TROPONIN I (HIGH SENSITIVITY)
Troponin I (High Sensitivity): 25 ng/L — ABNORMAL HIGH (ref <18)
Troponin I (High Sensitivity): 29 ng/L — ABNORMAL HIGH (ref <18)

## 2020-03-27 MED ORDER — METOPROLOL TARTRATE 25 MG PO TABS
50.0000 mg | ORAL_TABLET | Freq: Two times a day (BID) | ORAL | Status: DC
  Filled 2020-03-27 (×2): qty 2

## 2020-03-27 MED ORDER — INSULIN GLARGINE 100 UNIT/ML ~~LOC~~ SOLN
40.0000 [IU] | Freq: Every day | SUBCUTANEOUS | Status: DC
  Administered 2020-03-28 – 2020-03-29 (×2): 40 [IU] via SUBCUTANEOUS
  Filled 2020-03-27 (×2): qty 0.4

## 2020-03-27 MED ORDER — RANOLAZINE ER 500 MG PO TB12
500.0000 mg | ORAL_TABLET | Freq: Two times a day (BID) | ORAL | Status: DC
  Administered 2020-03-28 – 2020-03-29 (×3): 500 mg via ORAL
  Filled 2020-03-27 (×4): qty 1

## 2020-03-27 MED ORDER — ACETAMINOPHEN 325 MG PO TABS
650.0000 mg | ORAL_TABLET | Freq: Four times a day (QID) | ORAL | Status: DC | PRN
  Administered 2020-03-28: 650 mg via ORAL
  Filled 2020-03-27: qty 2

## 2020-03-27 MED ORDER — SENNOSIDES-DOCUSATE SODIUM 8.6-50 MG PO TABS: 1.0000 | ORAL_TABLET | Freq: Every evening | ORAL | Status: DC | PRN

## 2020-03-27 MED ORDER — ROSUVASTATIN CALCIUM 5 MG PO TABS
10.0000 mg | ORAL_TABLET | Freq: Every day | ORAL | Status: DC
Start: 2020-03-28 — End: 2020-03-30
  Administered 2020-03-28 – 2020-03-29 (×2): 10 mg via ORAL
  Filled 2020-03-27 (×3): qty 2

## 2020-03-27 MED ORDER — LOSARTAN POTASSIUM 50 MG PO TABS
100.0000 mg | ORAL_TABLET | Freq: Every day | ORAL | Status: DC
  Administered 2020-03-28 – 2020-03-29 (×2): 100 mg via ORAL
  Filled 2020-03-27 (×3): qty 2

## 2020-03-27 MED ORDER — SODIUM CHLORIDE 0.9 % IV SOLN: 250.0000 mL | INTRAVENOUS | Status: DC | PRN

## 2020-03-27 MED ORDER — INSULIN ASPART 100 UNIT/ML ~~LOC~~ SOLN: 0.0000 [IU] | Freq: Every day | SUBCUTANEOUS | Status: DC

## 2020-03-27 MED ORDER — ONDANSETRON HCL 4 MG/2ML IJ SOLN: 4.0000 mg | Freq: Four times a day (QID) | INTRAMUSCULAR | Status: DC | PRN

## 2020-03-27 MED ORDER — ACETAMINOPHEN 650 MG RE SUPP: 650.0000 mg | Freq: Four times a day (QID) | RECTAL | Status: DC | PRN

## 2020-03-27 MED ORDER — INSULIN ASPART 100 UNIT/ML ~~LOC~~ SOLN
0.0000 [IU] | Freq: Three times a day (TID) | SUBCUTANEOUS | Status: DC
  Administered 2020-03-28 (×2): 2 [IU] via SUBCUTANEOUS

## 2020-03-27 MED ORDER — CLONAZEPAM 0.5 MG PO TABS
0.5000 mg | ORAL_TABLET | Freq: Every day | ORAL | Status: DC
  Filled 2020-03-27: qty 1

## 2020-03-27 MED ORDER — SODIUM CHLORIDE 0.9% FLUSH
3.0000 mL | Freq: Two times a day (BID) | INTRAVENOUS | Status: DC
  Administered 2020-03-28 – 2020-03-29 (×2): 3 mL via INTRAVENOUS

## 2020-03-27 MED ORDER — HYDRALAZINE HCL 25 MG PO TABS
50.0000 mg | ORAL_TABLET | Freq: Three times a day (TID) | ORAL | Status: DC | PRN
Start: 2020-03-27 — End: 2020-03-28
  Administered 2020-03-27: 50 mg via ORAL
  Filled 2020-03-27 (×2): qty 2

## 2020-03-27 MED ORDER — ISOSORBIDE MONONITRATE ER 30 MG PO TB24
30.0000 mg | ORAL_TABLET | Freq: Every day | ORAL | Status: DC
Start: 2020-03-28 — End: 2020-03-30
  Administered 2020-03-28 – 2020-03-29 (×2): 30 mg via ORAL
  Filled 2020-03-27 (×2): qty 1

## 2020-03-27 MED ORDER — ENOXAPARIN SODIUM 40 MG/0.4ML ~~LOC~~ SOLN
40.0000 mg | SUBCUTANEOUS | Status: DC
  Administered 2020-03-28 (×2): 40 mg via SUBCUTANEOUS
  Filled 2020-03-27 (×2): qty 0.4

## 2020-03-27 MED ORDER — ONDANSETRON HCL 4 MG PO TABS: 4.0000 mg | ORAL_TABLET | Freq: Four times a day (QID) | ORAL | Status: DC | PRN

## 2020-03-27 MED ORDER — ASPIRIN 81 MG PO CHEW
81.0000 mg | CHEWABLE_TABLET | Freq: Every day | ORAL | Status: DC
  Administered 2020-03-28 – 2020-03-29 (×2): 81 mg via ORAL
  Filled 2020-03-27 (×2): qty 1

## 2020-03-27 NOTE — H&P (Addendum)
History and Physical    Alexis Griffith [id number] DOB: Jul 06, 1940 DOA: 03/27/2020  PCP: Haywood Pao, MD   Patient coming from: Home   Chief Complaint: Near syncope   HPI: Alexis Griffith is a 80 y.o. male with medical history significant for CAD, status post TAVR, chronic diastolic CHF, CKD 3A, left renal mass, anxiety, and insulin-dependent diabetes mellitus, now presenting to the emergency department for evaluation of a near syncopal episode, elevated blood pressure, and elevated outpatient troponin.  Patient reports he been in his usual state of health yesterday when he was shopping with his wife and experienced acute onset of the lightheadedness.  Patient had been standing in a store when symptoms developed, was associated with some sweating, and he felt that he was going to pass out but did not fall or lose consciousness.  He walked outside and began to feel better.  He initially sought evaluation at an outpatient clinic where he was found to have systolic blood pressure in the low 200s.  Blood work was ordered from the outpatient clinic which included cardiac enzymes.  Patient was told that his troponin was elevated and instructed to proceed to the ED.  He denies any chest pain, denies any change in mild chronic leg swelling, denies lightheadedness upon sitting up or standing, and denies any shortness of breath or cough.  ED Course: Upon arrival to the ED, patient is found to be afebrile, saturating mid 90s on room air, and blood pressure 208/77.  EKG features sinus rhythm with first-degree AV nodal block, LVH, and repolarization abnormality.  Chest x-ray negative for acute cardiopulmonary disease.  Chemistry panel notable for hyperglycemia at creatinine 1.26.  CBC is unremarkable.  High-sensitivity troponin was 25 and then 29.  Glucosuria and proteinuria noted on UA.  COVID-19 PCR not yet resulted.  Review of Systems:  All other systems reviewed and apart from HPI, are  negative.  Past Medical History:  Diagnosis Date  . Anxiety   . 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 CAD (coronary artery disease)   . Carotid artery disease ([place])    CTA 08/2019 of the head and neck show moderate 65 to 75% bilateral ICA stenoses   . Chronic diastolic CHF (congestive heart failure) ([place])   . Diabetes mellitus type 2 in nonobese (HCC)   . Excessive urination at night 06/05/2013  . 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 Hyperlipidemia   . Hypertension   . Hypertensive urgency   . Hypomagnesemia 09/11/2019  . Mild anemia 09/11/2019  . NSTEMI (non-ST elevated myocardial infarction) ([place]) 09/09/2019  . Orthostatic hypotension 09/11/2019  . Premature atrial contractions 09/12/2019  . Pulmonary nodules   . PVC (premature ventricular contraction)   . Renal mass   . 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 Burt Knack & Dr. Cyndia Bent  . Severe aortic stenosis   . Sinus bradycardia on ECG   . Sleep apnea   . 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 Wilburn Cornelia.  . Stroke ([place])   . TIA (transient ischemic attack)   . Urinary retention 09/11/2019    Past Surgical History:  Procedure Laterality Date  . CATARACT EXTRACTION, BILATERAL  10/2015, and 11/2015   with adding  TORIC lenses bilaterally   . INTRAVASCULAR PRESSURE WIRE/FFR STUDY N/A 07/07/2019   Procedure: INTRAVASCULAR PRESSURE WIRE/FFR STUDY;  Surgeon: [name], MD;  Location:  [place] IN[facility]ASIVE [facility];  Service: Cardiovascular;  Laterality: N/A;  . LEFT HEART CATH AND CORONARY ANGIOGRAPHY N/A 07/07/2019   Procedure: LEFT HEART CATH AND CORONARY ANGIOGRAPHY;  Surgeon: [name], MD;  Location: [place] CV LAB;  Service: Cardiovascular;  Laterality: N/A;  . SQUAMOUS CELL CARCINOMA EXCISION     tongue  . TEE WITHOUT CARDIOVERSION N/A 08/15/2019   Procedure: TRANSESOPHAGEAL ECHOCARDIOGRAM (TEE);  Surgeon: Sherren Mocha, MD;  Location: [place];  Service: Open Heart Surgery;  Laterality: N/A;  . TRANSCATHETER AORTIC VALVE REPLACEMENT, TRANSFEMORAL N/A 08/15/2019   Procedure: TRANSCATHETER AORTIC VALVE REPLACEMENT, TRANSFEMORAL using Edwards [employer] SAPIEN 3 Ultra 26 mm THV.;  Surgeon: Sherren Mocha, MD;  Location: [facility];  Service: Open Heart Surgery;  Laterality: N/A;    Social History:   reports that he quit smoking about 44 years ago. He has never used smokeless tobacco. He reports that he does not drink alcohol and does not use drugs.  Allergies  Allergen Reactions  . Macrodantin [Nitrofurantoin] Other (See Comments)    "blocked kidneys"   . Tape Other (See Comments)    "Tears my skin and leaves marks." PLEASE USE COBAN!!  . Sulfa Antibiotics Rash  . Lisinopril Cough  . Nutritional Supplements Other (See Comments)    Protein drink given to me "did something"  . Penicillins Other (See Comments)    08/30/19- Patient can't remember, was 35+ years ago. OK with trying cephalexin in hospital    Family History  Problem Relation Age of Onset  . Heart disease Mother   . Heart attack Mother   . Heart disease Father   . Heart attack Father      Prior to Admission medications   Medication Sig Start Date End Date Taking? Authorizing Provider  aspirin 81 MG chewable tablet Chew 1 tablet (81 mg total) by mouth daily. 10/03/19   Kayleen Memos, DO  clonazePAM (KLONOPIN) 0.5 MG tablet Take 0.5 mg by mouth daily.    [provider]  FENOFIBRATE PO Take 135 mg by mouth daily.     [provider]  glucose blood test strip 1 each by Other route as needed.  05/12/12   [provider]  insulin glargine (LANTUS) 100 UNIT/ML injection Inject 60 Units into the skin daily.    [provider]  isosorbide mononitrate (IMDUR) 30 MG 24 hr tablet Take 1 tablet (30 mg total) by mouth daily. 02/29/20   Nahser, Wonda Cheng, MD  Lancets [facility] DELICA PLUS [id number]) [place] 1 each by  Other route daily.  03/23/18   [provider]  losartan (COZAAR) 100 MG tablet Take 1 tablet (100 mg total) by mouth daily. 01/22/20   Nahser, Wonda Cheng, MD  magnesium oxide (MAG-OX) 400 (241.3 Mg) MG tablet Take 1 tablet (400 mg total) by mouth daily. 09/24/19   Barrett, Evelene Croon, PA-C  metoprolol tartrate (LOPRESSOR) 50 MG tablet Take 1 tablet (50 mg total) by mouth 2 (two) times daily. 02/29/20   Nahser, Wonda Cheng, MD  nitroGLYCERIN (NITROSTAT) 0.4 MG SL tablet Place 1 tablet (0.4 mg total) under the tongue every 5 (five) minutes as needed for chest pain (up to 3 doses). Do not give if blood pressure is low (less than [id number] systolic). 02/05/20   Nahser, Wonda Cheng, MD  ONE TOUCH ULTRA TEST test strip 1 each by Other route daily.  05/12/12   [provider]  ranolazine (RANEXA) 500 MG 12 hr tablet TAKE 1 TABLET BY  MOUTH TWICE DAILY. 01/31/20   Nahser, Wonda Cheng, MD  rosuvastatin (CRESTOR) 10 MG tablet Take 1 tablet (10 mg total) by mouth daily. 11/20/19 11/14/20  Nahser, Wonda Cheng, MD  sitaGLIPtin (JANUVIA) 100 MG tablet Take 100 mg by mouth daily.    [provider]    Physical Exam: Vitals:   03/27/20 2100 03/27/20 2115 03/27/20 2130 03/27/20 2145  BP: (!) 217/78 (!) 196/71 (!) 196/67 (!) 196/70  Pulse: (!) 59 63 (!) 57 (!) 56  Resp: 14     Temp:      TempSrc:      SpO2: 99% 100% 97% 98%    Constitutional: NAD, calm  Eyes: PERTLA, lids and conjunctivae normal ENMT: Mucous membranes are moist. Posterior pharynx clear of any exudate or lesions.   Neck: normal, supple, no masses, no thyromegaly Respiratory: no wheezing, no crackles. No accessory muscle use.  Cardiovascular: S1 & S2 heard, regular rate and rhythm. No extremity edema.  Abdomen: No distension, no tenderness, soft. Bowel sounds active.  Musculoskeletal: no clubbing / cyanosis. No joint deformity upper and lower extremities.   Skin: no significant rashes, lesions, ulcers. Warm, dry, well-perfused. Neurologic:  CN 2-12 grossly intact. Sensation intact. Moving all extremities.  Psychiatric: Alert and oriented to person, place, and situation. Pleasant and cooperative.    Labs and Imaging on Admission: I have personally reviewed following labs and imaging studies  CBC: Recent Labs  Lab 03/27/20 1501  WBC 6.3  HGB 15.1  HCT 44.1  MCV 84.5  PLT 270   Basic Metabolic Panel: Recent Labs  Lab 03/27/20 1501  NA 131*  K 3.8  CL 96*  CO2 23  GLUCOSE 266*  BUN 22  CREATININE 1.26*  CALCIUM 9.8   GFR: CrCl cannot be calculated (Unknown ideal weight.). Liver Function Tests: No results for input(s): AST, ALT, ALKPHOS, BILITOT, PROT, ALBUMIN in the last 168 hours. No results for input(s): LIPASE, AMYLASE in the last 168 hours. No results for input(s): AMMONIA in the last 168 hours. Coagulation Profile: No results for input(s): INR, PROTIME in the last 168 hours. Cardiac Enzymes: No results for input(s): CKTOTAL, CKMB, CKMBINDEX, TROPONINI in the last 168 hours. BNP (last 3 results) No results for input(s): PROBNP in the last 8760 hours. HbA1C: No results for input(s): HGBA1C in the last 72 hours. CBG: Recent Labs  Lab 03/27/20 2200  GLUCAP 133*   Lipid Profile: No results for input(s): CHOL, HDL, LDLCALC, TRIG, CHOLHDL, LDLDIRECT in the last 72 hours. Thyroid Function Tests: No results for input(s): TSH, T4TOTAL, FREET4, T3FREE, THYROIDAB in the last 72 hours. Anemia Panel: No results for input(s): VITAMINB12, FOLATE, FERRITIN, TIBC, IRON, RETICCTPCT in the last 72 hours. Urine analysis:    Component Value Date/Time   COLORURINE COLORLESS (A) 03/27/2020 2002   APPEARANCEUR CLEAR 03/27/2020 2002   LABSPEC 1.005 03/27/2020 2002   PHURINE 7.0 03/27/2020 2002   GLUCOSEU 150 (A) 03/27/2020 2002   HGBUR NEGATIVE 03/27/2020 2002   BILIRUBINUR NEGATIVE 03/27/2020 2002   KETONESUR NEGATIVE 03/27/2020 2002   PROTEINUR 100 (A) 03/27/2020 2002   NITRITE NEGATIVE 03/27/2020 2002    LEUKOCYTESUR NEGATIVE 03/27/2020 2002   Sepsis Labs: @LABRCNTIP (procalcitonin:4,lacticidven:4) )No results found for this or any previous visit (from the past 240 hour(s)).   Radiological Exams on Admission: DG Chest 2 View  Result Date: 03/27/2020 CLINICAL DATA:  Chest pain.  Elevated troponin levels. EXAM: CHEST - 2 VIEW COMPARISON:  Radiographs 12/17/2019 and 12/01/2019.  CT 11/15/2019. FINDINGS: The  heart size and mediastinal contours are stable. There is mild aortic atherosclerosis post TAVR. The lungs appear stable without suspicious findings. There is a small calcified left upper lobe granuloma. No pleural effusion or pneumothorax. The bones appear unremarkable. IMPRESSION: Stable chest. No active cardiopulmonary process. Electronically Signed   By: Richardean Sale M.D.   On: 03/27/2020 15:44    EKG: Independently reviewed. Sinus rhythm, 1st deg AV block, LVH with repolarization abnormality.   Assessment/Plan   1. Near-syncope   - Presents after a near-syncopal episode that occurred while standing  - EKG with sinus rhythm and appears similar to priors  - SBP dropped 40 mmHg on standing in ED but he was asymptomatic  - Continue cardiac monitoring, check echocardiogram   2. CAD; troponin elevation  - HS troponin mildly elevated and similar to prior levels - Patient reports occasional chest discomfort but none now   - Continue ASA, losartan, metoprolol, Imdur, Ranexa    3. CKD IIIa; left renal mass  - SCr is 1.26 in ED, similar to priors  - He has left renal mass followed by urology and planned for repeat CT in March per patient report  - Renally-dose medications, continue urology follow-up    4. Insulin-dependent DM  - A1c was 7.1% in August 2021  - CBG checks, insulin    5. Hypertension  - BP has been labile, SBP as high as 208 in ED and asymptomatic  - Continue losartan and metoprolol, use hydralazine if needed    6. Anxiety  - Continue Klonopin    DVT prophylaxis:  Lovenox  Code Status: Full   Level of Care: Level of care: Telemetry Cardiac Family Communication: Discussed with patient  Disposition Plan:  Patient is from: home  Anticipated d/c is to: Home  Anticipated d/c date is: 1/17 or 03/29/20 Patient currently: Pending cardiac monitoring, echocardiogram  Consults called: none   Admission status: Observation     Vianne Bulls, MD [facility]  03/27/2020, 10:22 PM

## 2020-03-27 NOTE — Telephone Encounter (Signed)
Patient called to discuss new prescription for clonidine prescribed by PCP. Patient would like Dr. Acie Fredrickson to review and advise if medication is needed. Patient reported they took his BP at PCP appointment yesterday and it was 220/94. Patient took his blood pressure this morning and received two readings of 155/64 and 153/59.   Patient would like approval from Dr. Acie Fredrickson prior to starting medication. This nurse will forward to Dr. Acie Fredrickson for review.

## 2020-03-27 NOTE — Progress Notes (Signed)
Dictation on: 03/27/2020  7:11 PM by: Lyman Bishop C [[id number]]

## 2020-03-27 NOTE — ED Triage Notes (Signed)
Patient BIB [facility] after being told by his PCP to go to ED because his troponin was elevated. Patient denies chest pain. 18g saline lock in left AC.

## 2020-03-27 NOTE — Telephone Encounter (Signed)
Pt c/o BP issue: STAT if pt c/o blurred vision, one-sided weakness or slurred speech  1. What are your last 5 BP readings?  155/64  153/59  2. Are you having any other symptoms (ex. Dizziness, headache, blurred vision, passed out)? No.  3. What is your BP issue? Patient states his BP has been higher than normal and started feeling dizzy, lightheaded like he was going to pass out. Patient states that he was prescribed Clonidine by [facility] and he wants to make Dr. Acie Fredrickson aware of this, he states that he doesn't want to start taking these meds until Dr. Acie Fredrickson approves it. Please advise.

## 2020-03-27 NOTE — ED Provider Notes (Signed)
[facility]ERGENCY DEPARTMENT Provider Note   CSN: [id number] Arrival date & time: 03/27/20  1444     History Chief Complaint  Patient presents with  . Chest Pain    Alexis Griffith is a 80 y.o. male.  Patient had an episode of near syncope yesterday, he got lightheaded felt like he was going to pass out.  States it may have been due to claustrophobia being in a store.  He has a history of anxiety and feels that it was related to that.  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  He also has a history of coronary artery disease medically managed.  He denies any chest pain shortness of breath.  Denies any weight gain or swelling.  Denies any sick symptoms.  Symptoms of lightheadedness are resolved.  He went to her primary care provider who wanted to start him on a new antihypertensive per the guidance of his cardiologist he held that.  And a troponin was drawn the time was elevated.  Looking back at previous labs he had chronic elevation in troponin.          Past Medical History:  Diagnosis Date  . Anxiety   . 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 CAD (coronary artery disease)   . Carotid artery disease ([place])    CTA 08/2019 of the head and neck show moderate 65 to 75% bilateral ICA stenoses   . Chronic diastolic CHF (congestive heart failure) ([place])   . Diabetes mellitus type 2 in nonobese (HCC)   . Excessive urination at night 06/05/2013  . 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 Hyperlipidemia   . Hypertension   . Hypertensive urgency   . Hypomagnesemia 09/11/2019  . Mild anemia 09/11/2019  . NSTEMI (non-ST elevated myocardial infarction) ([place]) 09/09/2019  . Orthostatic hypotension 09/11/2019  . Premature atrial contractions 09/12/2019  . Pulmonary nodules   . PVC (premature ventricular contraction)   . Renal mass   . 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  Burt Knack & Dr. Cyndia Bent  . Severe aortic stenosis   . Sinus bradycardia on ECG   . Sleep apnea   . 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 Wilburn Cornelia.  . Stroke ([place])   . TIA (transient ischemic attack)   . Urinary retention 09/11/2019    Patient Active Problem List   Diagnosis Date Noted  . Chest pain at rest 12/17/2019  . Hypertension associated with diabetes ([place]) 12/01/2019  . Hyperlipidemia associated with type 2 diabetes mellitus ([place]) 12/01/2019  . UTI (urinary tract infection) due to urinary indwelling catheter ([place]) 10/20/2019  . UTI (urinary tract infection)   . Syncope   . Syncope and collapse 10/01/2019  . Abdominal pain   . CHF (congestive heart failure) ([place]) 09/25/2019  . Demand ischemia ([place]) 09/22/2019  . Premature atrial contractions 09/12/2019  . Urinary retention 09/11/2019  . Mild anemia 09/11/2019  . Hypomagnesemia 09/11/2019  . NSTEMI (non-ST elevated myocardial infarction) ([place]) 09/09/2019  . Chest pain with high risk for cardiac etiology   . Hypertensive urgency   . Chronic diastolic CHF (congestive heart failure) ([place]) 08/15/2019  . 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 Diabetes mellitus type 2 in nonobese (HCC)   . Severe aortic stenosis   . Sleep apnea   . Renal mass   .  CAD (coronary artery disease) 07/04/2019  . 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 Excessive urination at night 06/05/2013  . TIA (transient ischemic attack) 07/27/2012  . 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 Carotid artery disease ([place]) 08/24/2011  . PVC's (premature ventricular contractions)   . Labile hypertension   . Hyperlipidemia LDL goal <70   . Anxiety   . Sinus bradycardia on ECG     Past Surgical History:  Procedure Laterality Date  . CATARACT EXTRACTION, BILATERAL  10/2015, and 11/2015   with adding  TORIC lenses bilaterally   . INTRAVASCULAR PRESSURE WIRE/FFR STUDY N/A 07/07/2019   Procedure:  INTRAVASCULAR PRESSURE WIRE/FFR STUDY;  Surgeon: [name], MD;  Location: [facility];  Service: Cardiovascular;  Laterality: N/A;  . LEFT HEART CATH AND CORONARY ANGIOGRAPHY N/A 07/07/2019   Procedure: LEFT HEART CATH AND CORONARY ANGIOGRAPHY;  Surgeon: [name], MD;  Location: [facility];  Service: Cardiovascular;  Laterality: N/A;  . SQUAMOUS CELL CARCINOMA EXCISION     tongue  . TEE WITHOUT CARDIOVERSION N/A 08/15/2019   Procedure: TRANSESOPHAGEAL ECHOCARDIOGRAM (TEE);  Surgeon: Sherren Mocha, MD;  Location: [facility];  Service: Open Heart Surgery;  Laterality: N/A;  . TRANSCATHETER AORTIC VALVE REPLACEMENT, TRANSFEMORAL N/A 08/15/2019   Procedure: TRANSCATHETER AORTIC VALVE REPLACEMENT, TRANSFEMORAL using Edwards [employer] SAPIEN 3 Ultra 26 mm THV.;  Surgeon: Sherren Mocha, MD;  Location: [facility];  Service: Open Heart Surgery;  Laterality: N/A;       Family History  Problem Relation Age of Onset  . Heart disease Mother   . Heart attack Mother   . Heart disease Father   . Heart attack Father     Social History   Tobacco Use  . Smoking status: Former Smoker    Quit date: 06/25/1975    Years since quitting: 44.7  . Smokeless tobacco: Never Used  Vaping Use  . Vaping Use: Never used  Substance Use Topics  . Alcohol use: No  . Drug use: No    Home Medications Prior to Admission medications   Medication Sig Start Date End Date Taking? Authorizing Provider  aspirin 81 MG chewable tablet Chew 1 tablet (81 mg total) by mouth daily. 10/03/19   Kayleen Memos, DO  clonazePAM (KLONOPIN) 0.5 MG tablet Take 0.5 mg by mouth daily.    [provider]  FENOFIBRATE PO Take 135 mg by mouth daily.     [provider]  glucose blood test strip 1 each by Other route as needed.  05/12/12   [provider]  insulin glargine (LANTUS) 100 UNIT/ML injection Inject 60 Units into the skin daily.    [provider]  isosorbide mononitrate  (IMDUR) 30 MG 24 hr tablet Take 1 tablet (30 mg total) by mouth daily. 02/29/20   Nahser, Wonda Cheng, MD  Lancets [facility] DELICA PLUS [id number]) [place] 1 each by Other route daily.  03/23/18   [provider]  losartan (COZAAR) 100 MG tablet Take 1 tablet (100 mg total) by mouth daily. 01/22/20   Nahser, Wonda Cheng, MD  magnesium oxide (MAG-OX) 400 (241.3 Mg) MG tablet Take 1 tablet (400 mg total) by mouth daily. 09/24/19   Barrett, Evelene Croon, PA-C  metoprolol tartrate (LOPRESSOR) 50 MG tablet Take 1 tablet (50 mg total) by mouth 2 (two) times daily. 02/29/20   Nahser, Wonda Cheng, MD  nitroGLYCERIN (NITROSTAT) 0.4 MG SL tablet Place 1 tablet (0.4 mg total) under the tongue every 5 (five)  minutes as needed for chest pain (up to 3 doses). Do not give if blood pressure is low (less than [id number] systolic). 02/05/20   Nahser, Wonda Cheng, MD  ONE TOUCH ULTRA TEST test strip 1 each by Other route daily.  05/12/12   [provider]  ranolazine (RANEXA) 500 MG 12 hr tablet TAKE 1 TABLET BY MOUTH TWICE DAILY. 01/31/20   Nahser, Wonda Cheng, MD  rosuvastatin (CRESTOR) 10 MG tablet Take 1 tablet (10 mg total) by mouth daily. 11/20/19 11/14/20  Nahser, Wonda Cheng, MD  sitaGLIPtin (JANUVIA) 100 MG tablet Take 100 mg by mouth daily.    [provider]    Allergies    Macrodantin [nitrofurantoin], Tape, Sulfa antibiotics, Lisinopril, Nutritional supplements, and Penicillins  Review of Systems   Review of Systems  Constitutional: Negative for chills and fever.  HENT: Negative for congestion and rhinorrhea.   Respiratory: Negative for cough and shortness of breath.   Cardiovascular: Negative for chest pain and palpitations.  Gastrointestinal: Negative for diarrhea, nausea and vomiting.  Genitourinary: Negative for difficulty urinating and dysuria.  Musculoskeletal: Negative for arthralgias and back pain.  Skin: Negative for color change and rash.  Neurological: Positive for light-headedness. Negative for  syncope and headaches.    Physical Exam Updated Vital Signs BP (!) 206/76   Pulse 63   Temp 97.9 F (36.6 C) (Oral)   Resp 13   SpO2 100%   Physical Exam Vitals and nursing note reviewed.  Constitutional:      General: He is not in acute distress.    Appearance: Normal appearance.  HENT:     Head: Normocephalic and atraumatic.     Nose: No rhinorrhea.  Eyes:     General:        Right eye: No discharge.        Left eye: No discharge.     Conjunctiva/sclera: Conjunctivae normal.  Cardiovascular:     Rate and Rhythm: Normal rate and regular rhythm.     Heart sounds: Murmur heard.   Systolic murmur is present with a grade of 1/6. No friction rub. No S3 sounds.   Pulmonary:     Effort: Pulmonary effort is normal.     Breath sounds: No stridor.  Abdominal:     General: Abdomen is flat. There is no distension.     Palpations: Abdomen is soft.  Musculoskeletal:        General: No deformity or signs of injury.     Right lower leg: No tenderness. No edema.     Left lower leg: No tenderness. No edema.  Skin:    General: Skin is warm and dry.     Capillary Refill: Capillary refill takes less than 2 seconds.  Neurological:     General: No focal deficit present.     Mental Status: He is alert. Mental status is at baseline.     Motor: No weakness.  Psychiatric:        Mood and Affect: Mood normal.        Behavior: Behavior normal.        Thought Content: Thought content normal.     ED Results / Procedures / Treatments   Labs (all labs ordered are listed, but only abnormal results are displayed) Labs Reviewed  BASIC METABOLIC PANEL - Abnormal; Notable for the following components:      Result Value   Sodium 131 (*)    Chloride 96 (*)    Glucose, Bld 266 (*)  Creatinine, Ser 1.26 (*)    GFR, Estimated 58 (*)    All other components within normal limits  URINALYSIS, ROUTINE W REFLEX MICROSCOPIC - Abnormal; Notable for the following components:   Color, Urine  COLORLESS (*)    Glucose, UA 150 (*)    Protein, ur 100 (*)    All other components within normal limits  TROPONIN I (HIGH SENSITIVITY) - Abnormal; Notable for the following components:   Troponin I (High Sensitivity) 25 (*)    All other components within normal limits  TROPONIN I (HIGH SENSITIVITY) - Abnormal; Notable for the following components:   Troponin I (High Sensitivity) 29 (*)    All other components within normal limits  SARS CORONAVIRUS 2 (TAT 6-24 HRS)  CBC    EKG EKG Interpretation  Date/Time:  Wednesday March 27 2020 18:37:46 EST Ventricular Rate:  59 PR Interval:    QRS Duration: 108 QT Interval:  413 QTC Calculation: 410 R Axis:     Text Interpretation: Sinus rhythm Prolonged PR interval LVH with secondary repolarization abnormality Inferior infarct, age indeterminate Confirmed by Dewaine Conger [phone]) on 03/27/2020 7:10:18 PM   Radiology DG Chest 2 View  Result Date: 03/27/2020 CLINICAL DATA:  Chest pain.  Elevated troponin levels. EXAM: CHEST - 2 VIEW COMPARISON:  Radiographs 12/17/2019 and 12/01/2019.  CT 11/15/2019. FINDINGS: The heart size and mediastinal contours are stable. There is mild aortic atherosclerosis post TAVR. The lungs appear stable without suspicious findings. There is a small calcified left upper lobe granuloma. No pleural effusion or pneumothorax. The bones appear unremarkable. IMPRESSION: Stable chest. No active cardiopulmonary process. Electronically Signed   By: Richardean Sale M.D.   On: 03/27/2020 15:44    Procedures Procedures   Medications Ordered in ED Medications - No data to display  ED Course  I have reviewed the triage vital signs and the nursing notes.  Pertinent labs & imaging results that were available during my care of the patient were reviewed by me and considered in my medical decision making (see chart for details).    MDM Rules/Calculators/A&P                          Patient coming with near syncope, possibly  cardiac related, possibly vasovagal or psychiatric related.  Screening labs show elevated troponin at baseline, other labs unremarkable other than hyperglycemia chronic kidney disease.  We will get an EKG and touch base with cardiology.  Chest x-ray reviewed by radiology and myself  shows no acute cardiopulmonary pathology.  Patient EKG shows sinus rhythm without acute ischemic change interval abnormality or arrhythmia.  Signs of inferior ischemia consistent with previous, review of his chart shows significant right coronary artery disease, medically managed.  Consulted the cardiology group on call, Dr. Debara Pickett call back, and says this is unlikely to be cardiac in origin as he has had syncope work-up in the past and his cardiac disease has been stable.  He recommends observing the patient on monitor for arrhythmia serial troponins and looking for other causes of syncope.  He is not feel at this point the patient needs admission for further cardiac work-up.  If the second troponin is elevated they would reconsider but for now we will get further testing.  Lab studies unremarkable after my review troponin at baseline.  Chest x-ray no acute cardiopulmonary pathology.  Patient's had persistently elevated blood pressure, he also comments on having intermittent chest pressure.  Feel that he is  at high risk given known significant coronary artery disease aortic regurgitation and cardiac septal hypertrophy, his near syncopal symptoms are not likely cardiac in origin however I feel he warrants echocardiogram for possible consultation for hypertension control.  Possible further provocative testing as part.  Patient feels safe with this plan, feels concerned nervous about discharge without further testing.   Final Clinical Impression(s) / ED Diagnoses Final diagnoses:  Near syncope  Chest pressure    Rx / DC Orders ED Discharge Orders    None       Breck Coons, MD 03/27/20 2113

## 2020-03-28 ENCOUNTER — Observation Stay (HOSPITAL_COMMUNITY): Payer: Medicare Other

## 2020-03-28 DIAGNOSIS — Z85810 Personal history of malignant neoplasm of tongue: Secondary | ICD-10-CM | POA: Diagnosis not present

## 2020-03-28 DIAGNOSIS — R809 Proteinuria, unspecified: Secondary | ICD-10-CM | POA: Diagnosis present

## 2020-03-28 DIAGNOSIS — Z8673 Personal history of transient ischemic attack (TIA), and cerebral infarction without residual deficits: Secondary | ICD-10-CM | POA: Diagnosis not present

## 2020-03-28 DIAGNOSIS — R0789 Other chest pain: Secondary | ICD-10-CM | POA: Diagnosis present

## 2020-03-28 DIAGNOSIS — F419 Anxiety disorder, unspecified: Secondary | ICD-10-CM | POA: Diagnosis not present

## 2020-03-28 DIAGNOSIS — C649 Malignant neoplasm of unspecified kidney, except renal pelvis: Secondary | ICD-10-CM | POA: Diagnosis present

## 2020-03-28 DIAGNOSIS — Z87891 Personal history of nicotine dependence: Secondary | ICD-10-CM | POA: Diagnosis not present

## 2020-03-28 DIAGNOSIS — G4730 Sleep apnea, unspecified: Secondary | ICD-10-CM | POA: Diagnosis present

## 2020-03-28 DIAGNOSIS — I951 Orthostatic hypotension: Secondary | ICD-10-CM | POA: Diagnosis present

## 2020-03-28 DIAGNOSIS — N1831 Chronic kidney disease, stage 3a: Secondary | ICD-10-CM | POA: Diagnosis present

## 2020-03-28 DIAGNOSIS — I455 Other specified heart block: Secondary | ICD-10-CM | POA: Diagnosis present

## 2020-03-28 DIAGNOSIS — E860 Dehydration: Secondary | ICD-10-CM | POA: Diagnosis present

## 2020-03-28 DIAGNOSIS — Z953 Presence of xenogenic heart valve: Secondary | ICD-10-CM | POA: Diagnosis not present

## 2020-03-28 DIAGNOSIS — E785 Hyperlipidemia, unspecified: Secondary | ICD-10-CM | POA: Diagnosis present

## 2020-03-28 DIAGNOSIS — I252 Old myocardial infarction: Secondary | ICD-10-CM | POA: Diagnosis not present

## 2020-03-28 DIAGNOSIS — I2510 Atherosclerotic heart disease of native coronary artery without angina pectoris: Secondary | ICD-10-CM | POA: Diagnosis present

## 2020-03-28 DIAGNOSIS — Z7982 Long term (current) use of aspirin: Secondary | ICD-10-CM | POA: Diagnosis not present

## 2020-03-28 DIAGNOSIS — R918 Other nonspecific abnormal finding of lung field: Secondary | ICD-10-CM | POA: Diagnosis present

## 2020-03-28 DIAGNOSIS — E1165 Type 2 diabetes mellitus with hyperglycemia: Secondary | ICD-10-CM | POA: Diagnosis present

## 2020-03-28 DIAGNOSIS — Z8249 Family history of ischemic heart disease and other diseases of the circulatory system: Secondary | ICD-10-CM | POA: Diagnosis not present

## 2020-03-28 DIAGNOSIS — R55 Syncope and collapse: Secondary | ICD-10-CM | POA: Diagnosis not present

## 2020-03-28 DIAGNOSIS — F40240 Claustrophobia: Secondary | ICD-10-CM | POA: Diagnosis present

## 2020-03-28 DIAGNOSIS — Z20822 Contact with and (suspected) exposure to covid-19: Secondary | ICD-10-CM | POA: Diagnosis present

## 2020-03-28 DIAGNOSIS — I130 Hypertensive heart and chronic kidney disease with heart failure and stage 1 through stage 4 chronic kidney disease, or unspecified chronic kidney disease: Secondary | ICD-10-CM | POA: Diagnosis present

## 2020-03-28 DIAGNOSIS — I25118 Atherosclerotic heart disease of native coronary artery with other forms of angina pectoris: Secondary | ICD-10-CM | POA: Diagnosis not present

## 2020-03-28 DIAGNOSIS — I5032 Chronic diastolic (congestive) heart failure: Secondary | ICD-10-CM | POA: Diagnosis present

## 2020-03-28 DIAGNOSIS — Z79899 Other long term (current) drug therapy: Secondary | ICD-10-CM | POA: Diagnosis not present

## 2020-03-28 DIAGNOSIS — E1122 Type 2 diabetes mellitus with diabetic chronic kidney disease: Secondary | ICD-10-CM | POA: Diagnosis present

## 2020-03-28 LAB — CBC
HCT: 48.1 % (ref 39.0–52.0)
Hemoglobin: 15.7 g/dL (ref 13.0–17.0)
MCH: 28 pg (ref 26.0–34.0)
MCHC: 32.6 g/dL (ref 30.0–36.0)
MCV: 85.9 fL (ref 80.0–100.0)
Platelets: 158 10*3/uL (ref 150–400)
RBC: 5.6 MIL/uL (ref 4.22–5.81)
RDW: 13.5 % (ref 11.5–15.5)
WBC: 8.6 10*3/uL (ref 4.0–10.5)
nRBC: 0 % (ref 0.0–0.2)

## 2020-03-28 LAB — BASIC METABOLIC PANEL
Anion gap: 10 (ref 5–15)
BUN: 15 mg/dL (ref 8–23)
CO2: 26 mmol/L (ref 22–32)
Calcium: 9.5 mg/dL (ref 8.9–10.3)
Chloride: 102 mmol/L (ref 98–111)
Creatinine, Ser: 1 mg/dL (ref 0.61–1.24)
GFR, Estimated: >60 mL/min (ref >60)
Glucose, Bld: 142 mg/dL — ABNORMAL HIGH (ref 70–99)
Potassium: 3.4 mmol/L — ABNORMAL LOW (ref 3.5–5.1)
Sodium: 138 mmol/L (ref 135–145)

## 2020-03-28 LAB — SARS CORONAVIRUS 2 (TAT 6-24 HRS): SARS Coronavirus 2: NEGATIVE

## 2020-03-28 LAB — ECHOCARDIOGRAM COMPLETE
AR max vel: 2.31 cm2
AV Area VTI: 2.6 cm2
AV Area mean vel: 2.64 cm2
AV Mean grad: 6 mmHg
AV Peak grad: 12.3 mmHg
Ao pk vel: 1.75 m/s
Area-P 1/2: 2.66 cm2
S' Lateral: 3.1 cm

## 2020-03-28 LAB — GLUCOSE, CAPILLARY
Glucose-Capillary: 154 mg/dL — ABNORMAL HIGH (ref 70–99)
Glucose-Capillary: 144 mg/dL — ABNORMAL HIGH (ref 70–99)

## 2020-03-28 LAB — HEMOGLOBIN A1C
Hgb A1c MFr Bld: 6.1 % — ABNORMAL HIGH (ref 4.8–5.6)
Mean Plasma Glucose: 128.37 mg/dL

## 2020-03-28 LAB — MAGNESIUM: Magnesium: 1.8 mg/dL (ref 1.7–2.4)

## 2020-03-28 LAB — CBG MONITORING, ED
Glucose-Capillary: 150 mg/dL — ABNORMAL HIGH (ref 70–99)
Glucose-Capillary: 185 mg/dL — ABNORMAL HIGH (ref 70–99)

## 2020-03-28 MED ORDER — POTASSIUM CHLORIDE CRYS ER 20 MEQ PO TBCR
40.0000 meq | EXTENDED_RELEASE_TABLET | Freq: Once | ORAL | Status: AC
  Administered 2020-03-28: 40 meq via ORAL
  Filled 2020-03-28: qty 2

## 2020-03-28 MED ORDER — METOPROLOL TARTRATE 25 MG PO TABS: 75.0000 mg | ORAL_TABLET | Freq: Two times a day (BID) | ORAL | Status: DC

## 2020-03-28 MED ORDER — SODIUM CHLORIDE 0.9 % IV SOLN: INTRAVENOUS | Status: AC

## 2020-03-28 MED ORDER — METOPROLOL TARTRATE 100 MG PO TABS
100.0000 mg | ORAL_TABLET | Freq: Two times a day (BID) | ORAL | Status: DC
  Administered 2020-03-28 – 2020-03-29 (×3): 100 mg via ORAL
  Filled 2020-03-28: qty 4
  Filled 2020-03-28 (×2): qty 1

## 2020-03-28 MED ORDER — CLONAZEPAM 0.5 MG PO TABS
0.5000 mg | ORAL_TABLET | Freq: Every day | ORAL | Status: DC
Start: 2020-03-28 — End: 2020-03-30
  Administered 2020-03-28 – 2020-03-29 (×2): 0.5 mg via ORAL
  Filled 2020-03-28 (×2): qty 1

## 2020-03-28 MED ORDER — SODIUM CHLORIDE 0.9 % IV BOLUS
500.0000 mL | Freq: Once | INTRAVENOUS | Status: AC
  Administered 2020-03-28: 500 mL via INTRAVENOUS

## 2020-03-28 NOTE — Progress Notes (Signed)
PROGRESS NOTE    Alexis Griffith   YQI:[id number]  DOB: 19-May-1940  DOA: 03/27/2020 PCP: Haywood Pao, MD   Brief Narrative:  Alexis Griffith is a 80 y.o. male with medical history significant for CAD, status post TAVR, chronic diastolic CHF, CKD 3A, left renal mass, anxiety, and insulin-dependent diabetes mellitus, now presenting to the emergency department for evaluation of a near syncopal episode, elevated blood pressure, and elevated outpatient troponin.  Patient reports he been in his usual state of health yesterday when he was shopping with his wife and experienced acute onset of the lightheadedness.  Patient had been standing in a store when symptoms developed, was associated with some sweating, and he felt that he was going to pass out but did not fall or lose consciousness.  He walked outside and began to feel better.  He initially sought evaluation at an outpatient clinic where he was found to have systolic blood pressure in the low 200s.  Blood work was ordered from the outpatient clinic which included cardiac enzymes.  Patient was told that his troponin was elevated and instructed to proceed to the ED.  He denies any chest pain, denies any change in mild chronic leg swelling, denies lightheadedness upon sitting up or standing, and denies any shortness of breath or cough.    Subjective: He felt lightheaded today when ambulating today. No chest pain.   Assessment & Plan:   Principal Problem:   Near syncope- Elevated Cr - Cr 1.26 and sodium 131- likely due to dehydration - given 500 cc NS bolus this AM but remained orthostatic after - second 500 cc NS bolus ordered. Orthostatic vitals are still positive  - 2 D ECHO reveals grade 1 d CHF - TEDs placed to help with orthostatic symptoms - will give 50 cc NS x 8 hrs overnight- repeat orthostatics in AM  Active Problems:   Uncontrolled hypertension - BP quite elevated this AM- 177/68, 198/81, 189/80 -  I have doubled his  Toprol - cont to follow BP    CAD (coronary artery disease)   Chronic diastolic CHF (congestive heart failure) S/p TAVR - elevated troponin have remained < 30 - no chest pain - 2 D ECHO reveals no new abnormalities - cont ASA     Diabetes mellitus type 2 in nonobese (HCC) - cont SSI and Lantus   Time spent in minutes: 35 DVT prophylaxis: enoxaparin (LOVENOX) injection 40 mg Start: 03/27/20 2200  Code Status: Full code Family Communication:  Level of Care: Level of care: Telemetry Cardiac Disposition Plan:  Status is: Observation  The patient will require care spanning > 2 midnights and should be moved to inpatient because: IV treatments appropriate due to intensity of illness or inability to take PO  Dispo: The patient is from: Home              Anticipated d/c is to: Home              Anticipated d/c date is: 1 day              Patient currently is not medically stable to d/c.   Difficult to place patient No      Consultants:   none Procedures:   2 D ECHO Antimicrobials:  Anti-infectives (From admission, onward)   None       Objective: Vitals:   03/28/20 1500 03/28/20 1548 03/28/20 1631 03/28/20 1637  BP: (!) 131/57 (!) 148/61 (!) 154/61 (!) 154/61  Pulse:  68 62 62  Resp:  19 18 18   Temp:  98.2 F (36.8 C) (!) 97.5 F (36.4 C) (!) 97.5 F (36.4 C)  TempSrc:  Oral Oral Oral  SpO2:  98% 99% 99%  Weight:   82 kg 82 kg  Height:   5\' 10"  (1.778 m) 5\' 10"  (1.778 m)    Intake/Output Summary (Last 24 hours) at 03/28/2020 1926 Last data filed at 03/28/2020 1859 Gross per 24 hour  Intake 480 ml  Output --  Net 480 ml   Filed Weights   03/28/20 1631 03/28/20 1637  Weight: 82 kg 82 kg    Examination: General exam: Appears comfortable  HEENT: PERRLA, oral mucosa moist, no sclera icterus or thrush Respiratory system: Clear to auscultation. Respiratory effort normal. Cardiovascular system: S1 & S2 heard, RRR.   Gastrointestinal system: Abdomen soft,  non-tender, nondistended. Normal bowel sounds. Central nervous system: Alert and oriented. No focal neurological deficits. Extremities: No cyanosis, clubbing or edema Skin: No rashes or ulcers Psychiatry:  Mood & affect appropriate.     Data Reviewed: I have personally reviewed following labs and imaging studies  CBC: Recent Labs  Lab 03/27/20 1501 03/28/20 0453  WBC 6.3 8.6  HGB 15.1 15.7  HCT 44.1 48.1  MCV 84.5 85.9  PLT 160 158   Basic Metabolic Panel: Recent Labs  Lab 03/27/20 1501 03/28/20 0453  NA 131* 138  K 3.8 3.4*  CL 96* 102  CO2 23 26  GLUCOSE 266* 142*  BUN 22 15  CREATININE 1.26* 1.00  CALCIUM 9.8 9.5  MG  --  1.8   GFR: Estimated Creatinine Clearance: 61.8 mL/min (by C-G formula based on SCr of 1 mg/dL). Liver Function Tests: No results for input(s): AST, ALT, ALKPHOS, BILITOT, PROT, ALBUMIN in the last 168 hours. No results for input(s): LIPASE, AMYLASE in the last 168 hours. No results for input(s): AMMONIA in the last 168 hours. Coagulation Profile: No results for input(s): INR, PROTIME in the last 168 hours. Cardiac Enzymes: No results for input(s): CKTOTAL, CKMB, CKMBINDEX, TROPONINI in the last 168 hours. BNP (last 3 results) No results for input(s): PROBNP in the last 8760 hours. HbA1C: Recent Labs    03/28/20 0453  HGBA1C 6.1*   CBG: Recent Labs  Lab 03/27/20 2200 03/28/20 0839 03/28/20 1217 03/28/20 1717  GLUCAP 133* 150* 185* 154*   Lipid Profile: No results for input(s): CHOL, HDL, LDLCALC, TRIG, CHOLHDL, LDLDIRECT in the last 72 hours. Thyroid Function Tests: No results for input(s): TSH, T4TOTAL, FREET4, T3FREE, THYROIDAB in the last 72 hours. Anemia Panel: No results for input(s): VITAMINB12, FOLATE, FERRITIN, TIBC, IRON, RETICCTPCT in the last 72 hours. Urine analysis:    Component Value Date/Time   COLORURINE COLORLESS (A) 03/27/2020 2002   APPEARANCEUR CLEAR 03/27/2020 2002   LABSPEC 1.005 03/27/2020 2002    PHURINE 7.0 03/27/2020 2002   GLUCOSEU 150 (A) 03/27/2020 2002   HGBUR NEGATIVE 03/27/2020 2002   BILIRUBINUR NEGATIVE 03/27/2020 2002   KETONESUR NEGATIVE 03/27/2020 2002   PROTEINUR 100 (A) 03/27/2020 2002   NITRITE NEGATIVE 03/27/2020 2002   LEUKOCYTESUR NEGATIVE 03/27/2020 2002   Sepsis Labs: @LABRCNTIP (procalcitonin:4,lacticidven:4) ) Recent Results (from the past 240 hour(s))  SARS CORONAVIRUS 2 (TAT 6-24 HRS) Nasopharyngeal Nasopharyngeal Swab     Status: None   Collection Time: 03/27/20  9:49 PM   Specimen: Nasopharyngeal Swab  Result Value Ref Range Status   SARS Coronavirus 2 NEGATIVE NEGATIVE Final    Comment: (NOTE) SARS-CoV-2 target nucleic  acids are NOT DETECTED.  The SARS-CoV-2 RNA is generally detectable in upper and lower respiratory specimens during the acute phase of infection. Negative results do not preclude SARS-CoV-2 infection, do not rule out co-infections with other pathogens, and should not be used as the sole basis for treatment or other patient management decisions. Negative results must be combined with clinical observations, patient history, and epidemiological information. The expected result is Negative.  Fact Sheet for Patients: SugarRoll.be  Fact Sheet for Healthcare Providers: https://www.woods-mathews.com/  This test is not yet approved or cleared by the [place] FDA and  has been authorized for detection and/or diagnosis of SARS-CoV-2 by FDA under an Emergency Use Authorization (EUA). This EUA will remain  in effect (meaning this test can be used) for the duration of the COVID-19 declaration under Se ction 564(b)(1) of the Act, 21 U.S.C. section 360bbb-3(b)(1), unless the authorization is terminated or revoked sooner.  Performed at [facility], [address] [place], [place] [address]          Radiology Studies: DG Chest 2 View  Result Date: 03/27/2020 CLINICAL DATA:   Chest pain.  Elevated troponin levels. EXAM: CHEST - 2 VIEW COMPARISON:  Radiographs 12/17/2019 and 12/01/2019.  CT 11/15/2019. FINDINGS: The heart size and mediastinal contours are stable. There is mild aortic atherosclerosis post TAVR. The lungs appear stable without suspicious findings. There is a small calcified left upper lobe granuloma. No pleural effusion or pneumothorax. The bones appear unremarkable. IMPRESSION: Stable chest. No active cardiopulmonary process. Electronically Signed   By: Richardean Sale M.D.   On: 03/27/2020 15:44   ECHOCARDIOGRAM COMPLETE  Result Date: 03/28/2020    ECHOCARDIOGRAM REPORT   Patient Name:   Alexis Griffith Date of Exam: 03/28/2020 Medical Rec #:  [id number]      Height:       70.0 in Accession #:    [id number]     Weight:       186.2 lb Date of Birth:  [birth year]      BSA:          2.025 m Patient Age:    44 years       BP:           166/76 mmHg Patient Gender: M              HR:           83 bpm. Exam Location:  Inpatient Procedure: 2D Echo, Cardiac Doppler and Color Doppler Indications:    R55 Syncope  History:        Patient has prior history of Echocardiogram examinations, most                 recent 10/02/2019. CHF, Previous Myocardial Infarction and CAD,                 Stroke, TIA and Carotid Disease, Aortic Valve Disease; Risk                 Factors:Hypertension, Diabetes, Dyslipidemia and Sleep Apnea.                 Aortic Valve: 26 mm Sapien prosthetic, stented (TAVR) valve is                 present in the aortic position. Procedure Date: 08/15/2019.  Sonographer:    Jonelle Sidle Dance Referring Phys: [id number] [place]  1. Left ventricular ejection fraction, by estimation, is 60 to 65%. The  left ventricle has normal function. The left ventricle demonstrates regional wall motion abnormalities with basal inferior hypokinesis. There is moderate left ventricular hypertrophy. Left ventricular diastolic parameters are consistent with Grade I diastolic  dysfunction (impaired relaxation).  2. Right ventricular systolic function is normal. The right ventricular size is normal. Tricuspid regurgitation signal is inadequate for assessing PA pressure.  3. The mitral valve is normal in structure. No evidence of mitral valve regurgitation. No evidence of mitral stenosis.  4. Bioprosthetic aortic valve sp TAVR with 26 mm Edwards Sapien valve. Mean gradient 6 mmHg (no significant stenois), no significant regurgitation noted.  5. The inferior vena cava is normal in size with greater than 50% respiratory variability, suggesting right atrial pressure of 3 mmHg. FINDINGS  Left Ventricle: Left ventricular ejection fraction, by estimation, is 60 to 65%. The left ventricle has normal function. The left ventricle demonstrates regional wall motion abnormalities. The left ventricular internal cavity size was normal in size. There is moderate left ventricular hypertrophy. Left ventricular diastolic parameters are consistent with Grade I diastolic dysfunction (impaired relaxation). Right Ventricle: The right ventricular size is normal. No increase in right ventricular wall thickness. Right ventricular systolic function is normal. Tricuspid regurgitation signal is inadequate for assessing PA pressure. Left Atrium: Left atrial size was normal in size. Right Atrium: Right atrial size was normal in size. Pericardium: There is no evidence of pericardial effusion. Mitral Valve: The mitral valve is normal in structure. There is mild calcification of the mitral valve leaflet(s). Mild mitral annular calcification. No evidence of mitral valve regurgitation. No evidence of mitral valve stenosis. Tricuspid Valve: The tricuspid valve is normal in structure. Tricuspid valve regurgitation is not demonstrated. Aortic Valve: Bioprosthetic aortic valve sp TAVR with 26 mm Edwards Sapien valve. Mean gradient 6 mmHg (no significant stenois), no significant regurgitation noted. The aortic valve has been  repaired/replaced. Aortic valve regurgitation is not visualized. Aortic valve mean gradient measures 6.0 mmHg. Aortic valve peak gradient measures 12.2 mmHg. Aortic valve area, by VTI measures 2.60 cm. There is a 26 mm Sapien prosthetic, stented (TAVR) valve present in the aortic position. Procedure Date:  08/15/2019. Pulmonic Valve: The pulmonic valve was normal in structure. Pulmonic valve regurgitation is not visualized. Aorta: The aortic root is normal in size and structure. Venous: The inferior vena cava is normal in size with greater than 50% respiratory variability, suggesting right atrial pressure of 3 mmHg. IAS/Shunts: No atrial level shunt detected by color flow Doppler.  LEFT VENTRICLE PLAX 2D LVIDd:         4.40 cm  Diastology LVIDs:         3.10 cm  LV e' medial:    4.10 cm/s LV PW:         1.20 cm  LV E/e' medial:  16.0 LV IVS:        1.60 cm  LV e' lateral:   6.47 cm/s LVOT diam:     2.45 cm  LV E/e' lateral: 10.1 LV SV:         83 LV SV Index:   41 LVOT Area:     4.71 cm  RIGHT VENTRICLE             IVC RV Basal diam:  2.20 cm     IVC diam: 1.50 cm RV S prime:     14.50 cm/s TAPSE (M-mode): 2.5 cm LEFT ATRIUM             Index  RIGHT ATRIUM           Index LA diam:        4.20 cm 2.07 cm/m  RA Area:     10.20 cm LA Vol (A2C):   48.3 ml 23.85 ml/m RA Volume:   15.20 ml  7.51 ml/m LA Vol (A4C):   37.8 ml 18.67 ml/m LA Biplane Vol: 43.2 ml 21.33 ml/m  AORTIC VALVE AV Area (Vmax):    2.31 cm AV Area (Vmean):   2.64 cm AV Area (VTI):     2.60 cm AV Vmax:           175.00 cm/s AV Vmean:          116.000 cm/s AV VTI:            0.317 m AV Peak Grad:      12.2 mmHg AV Mean Grad:      6.0 mmHg LVOT Vmax:         85.80 cm/s LVOT Vmean:        64.900 cm/s LVOT VTI:          0.175 m LVOT/AV VTI ratio: 0.55  AORTA Ao Asc diam: 2.20 cm MITRAL VALVE MV Area (PHT): 2.66 cm     SHUNTS MV Decel Time: 285 msec     Systemic VTI:  0.18 m MV E velocity: 65.60 cm/s   Systemic Diam: 2.45 cm MV A velocity:  120.00 cm/s MV E/A ratio:  0.55 Loralie Champagne MD Electronically signed by Loralie Champagne MD Signature Date/Time: 03/28/2020/6:10:48 PM    Final       Scheduled Meds: . aspirin  81 mg Oral Daily  . clonazePAM  0.5 mg Oral Daily  . enoxaparin (LOVENOX) injection  40 mg Subcutaneous Q24H  . insulin aspart  0-5 Units Subcutaneous QHS  . insulin aspart  0-9 Units Subcutaneous TID WC  . insulin glargine  40 Units Subcutaneous Daily  . isosorbide mononitrate  30 mg Oral Daily  . losartan  100 mg Oral Daily  . metoprolol tartrate  100 mg Oral BID  . ranolazine  500 mg Oral BID  . rosuvastatin  10 mg Oral Daily  . sodium chloride flush  3 mL Intravenous Q12H   Continuous Infusions: . sodium chloride       LOS: 0 days      Debbe Odea, MD [facility] Pager: [email or web address] 03/28/2020, 7:26 PM

## 2020-03-28 NOTE — Telephone Encounter (Signed)
RN called to update patient of Dr. Elmarie Shiley recommendations for starting the low dose of clonidine. Patients spouse Bonnita Nasuti answered the phone and made RN aware that patient is currently being seen at the [facility] ED. Spouse states that he had lab work completed at his PCP and they told him he could be having a heart attack. Patient went to the ED on 03/27/2020. Spouse wanted to let Dr. Acie Fredrickson know. RN advised to call with any questions.

## 2020-03-28 NOTE — Telephone Encounter (Signed)
I suspect he is eating more salty foods than he should Ok to start low dose clonidine Have him keep a BP record of his bP and HR on a notepad and bring to his next office visit .

## 2020-03-28 NOTE — ED Notes (Signed)
Pt ambulated to the bathroom but reported feeling dizzy. Pt bp once we return back to the room was 151/69. MD Rizwan notified.

## 2020-03-28 NOTE — Progress Notes (Signed)
  Echocardiogram 2D Echocardiogram has been performed.  Alexis Griffith 03/28/2020, 11:54 AM

## 2020-03-29 ENCOUNTER — Other Ambulatory Visit: Payer: Self-pay

## 2020-03-29 DIAGNOSIS — E785 Hyperlipidemia, unspecified: Secondary | ICD-10-CM

## 2020-03-29 DIAGNOSIS — R55 Syncope and collapse: Secondary | ICD-10-CM | POA: Diagnosis not present

## 2020-03-29 DIAGNOSIS — Z954 Presence of other heart-valve replacement: Secondary | ICD-10-CM

## 2020-03-29 DIAGNOSIS — R7989 Other specified abnormal findings of blood chemistry: Secondary | ICD-10-CM

## 2020-03-29 DIAGNOSIS — N183 Chronic kidney disease, stage 3 unspecified: Secondary | ICD-10-CM

## 2020-03-29 DIAGNOSIS — Z952 Presence of prosthetic heart valve: Secondary | ICD-10-CM

## 2020-03-29 DIAGNOSIS — I5032 Chronic diastolic (congestive) heart failure: Secondary | ICD-10-CM | POA: Diagnosis not present

## 2020-03-29 DIAGNOSIS — E1122 Type 2 diabetes mellitus with diabetic chronic kidney disease: Secondary | ICD-10-CM

## 2020-03-29 DIAGNOSIS — I25118 Atherosclerotic heart disease of native coronary artery with other forms of angina pectoris: Secondary | ICD-10-CM | POA: Diagnosis not present

## 2020-03-29 DIAGNOSIS — I130 Hypertensive heart and chronic kidney disease with heart failure and stage 1 through stage 4 chronic kidney disease, or unspecified chronic kidney disease: Secondary | ICD-10-CM

## 2020-03-29 DIAGNOSIS — I2510 Atherosclerotic heart disease of native coronary artery without angina pectoris: Secondary | ICD-10-CM

## 2020-03-29 DIAGNOSIS — N1831 Chronic kidney disease, stage 3a: Secondary | ICD-10-CM | POA: Diagnosis not present

## 2020-03-29 DIAGNOSIS — I951 Orthostatic hypotension: Secondary | ICD-10-CM

## 2020-03-29 DIAGNOSIS — C649 Malignant neoplasm of unspecified kidney, except renal pelvis: Secondary | ICD-10-CM

## 2020-03-29 DIAGNOSIS — R0989 Other specified symptoms and signs involving the circulatory and respiratory systems: Secondary | ICD-10-CM

## 2020-03-29 DIAGNOSIS — R778 Other specified abnormalities of plasma proteins: Secondary | ICD-10-CM

## 2020-03-29 LAB — BASIC METABOLIC PANEL
Anion gap: 10 (ref 5–15)
BUN: 19 mg/dL (ref 8–23)
CO2: 24 mmol/L (ref 22–32)
Calcium: 9.4 mg/dL (ref 8.9–10.3)
Chloride: 104 mmol/L (ref 98–111)
Creatinine, Ser: 1.02 mg/dL (ref 0.61–1.24)
GFR, Estimated: >60 mL/min (ref >60)
Glucose, Bld: 117 mg/dL — ABNORMAL HIGH (ref 70–99)
Potassium: 3.8 mmol/L (ref 3.5–5.1)
Sodium: 138 mmol/L (ref 135–145)

## 2020-03-29 LAB — GLUCOSE, CAPILLARY
Glucose-Capillary: 107 mg/dL — ABNORMAL HIGH (ref 70–99)
Glucose-Capillary: 135 mg/dL — ABNORMAL HIGH (ref 70–99)
Glucose-Capillary: 123 mg/dL — ABNORMAL HIGH (ref 70–99)

## 2020-03-29 LAB — CBC
HCT: 45.1 % (ref 39.0–52.0)
Hemoglobin: 14.9 g/dL (ref 13.0–17.0)
MCH: 28.4 pg (ref 26.0–34.0)
MCHC: 33 g/dL (ref 30.0–36.0)
MCV: 86.1 fL (ref 80.0–100.0)
Platelets: 149 10*3/uL — ABNORMAL LOW (ref 150–400)
RBC: 5.24 MIL/uL (ref 4.22–5.81)
RDW: 13.6 % (ref 11.5–15.5)
WBC: 8.6 10*3/uL (ref 4.0–10.5)
nRBC: 0 % (ref 0.0–0.2)

## 2020-03-29 MED ORDER — METOPROLOL TARTRATE 50 MG PO TABS: 100.0000 mg | ORAL_TABLET | Freq: Two times a day (BID) | ORAL | 3 refills | Status: DC

## 2020-03-29 MED ORDER — INSULIN ASPART 100 UNIT/ML ~~LOC~~ SOLN
0.0000 [IU] | Freq: Three times a day (TID) | SUBCUTANEOUS | Status: DC
  Administered 2020-03-29 (×2): 1 [IU] via SUBCUTANEOUS

## 2020-03-29 NOTE — Discharge Instructions (Signed)
Near-Syncope Near-syncope is when you suddenly get weak or dizzy, or you feel like you might pass out (faint). This may also be called presyncope. This is due to a lack of blood flow to the brain. During an episode of near-syncope, you may:  Feel dizzy, weak, or light-headed.  Feel sick to your stomach (nauseous).  See all white or all black.  See spots.  Have cold, clammy skin. This condition is caused by a sudden decrease in blood flow to the brain. This decrease can result from various causes, but most of those causes are not dangerous. However, near-syncope may be a sign of a serious medical problem, so it is important to seek medical care. Follow these instructions at home: Medicines  Take over-the-counter and prescription medicines only as told by your doctor.  If you are taking blood pressure or heart medicine, get up slowly and spend many minutes getting ready to sit and then stand. This can help with dizziness. General instructions  Be aware of any changes in your symptoms.  Talk with your doctor about your symptoms. You may need to have testing to find the cause of your near-syncope.  If you start to feel like you might pass out, lie down right away. Raise (elevate) your feet above the level of your heart. Breathe deeply and steadily. Wait until all of the symptoms are gone.  Have someone stay with you until you feel stable.  Do not drive, use machinery, or play sports until your doctor says it is okay.  Drink enough fluid to keep your pee (urine) pale yellow.  Keep all follow-up visits as told by your doctor. This is important. Get help right away if you:  Have a seizure.  Have pain in your: ? Chest. ? Belly (abdomen). ? Back.  Faint once or more than once.  Have a very bad headache.  Are bleeding from your mouth or butt.  Have black or tarry poop (stool).  Have a very fast or uneven heartbeat (palpitations).  Are mixed up (confused).  Have trouble  walking.  Are very weak.  Have trouble seeing. These symptoms may be an emergency. Do not wait to see if the symptoms will go away. Get medical help right away. Call your local emergency services (911 in the [place] Do not drive yourself to the hospital. Summary Near-syncope is when you suddenly get weak or dizzy, or you feel like Orthostatic Hypotension Blood pressure is a measurement of how strongly, or weakly, your blood is pressing against the walls of your arteries. Orthostatic hypotension is a sudden drop in blood pressure that happens when you quickly change positions, such as when you get up from sitting or lying down. Arteries are blood vessels that carry blood from your heart throughout your body. When blood pressure is too low, you may not get enough blood to your brain or to the rest of your organs. This can cause weakness, light-headedness, rapid heartbeat, and fainting. This can last for just a few seconds or for up to a few minutes. Orthostatic hypotension is usually not a serious problem. However, if it happens frequently or gets worse, it may be a sign of something more serious. What are the causes? This condition may be caused by: Sudden changes in posture, such as standing up quickly after you have been sitting or lying down. Blood loss. Loss of body fluids (dehydration). Heart problems. Hormone (endocrine) problems. Pregnancy. Severe infection. Lack of certain nutrients. Severe allergic reactions (anaphylaxis). Certain medicines,  such as blood pressure medicine or medicines that make the body lose excess fluids (diuretics). Sometimes, this condition can be caused by not taking medicine as directed, such as taking too much of a certain medicine. What increases the risk? The following factors may make you more likely to develop this condition: Age. Risk increases as you get older. Conditions that affect the heart or the central nervous system. Taking certain medicines,  such as blood pressure medicine or diuretics. Being pregnant. What are the signs or symptoms? Symptoms of this condition may include: Weakness. Light-headedness. Dizziness. Blurred vision. Fatigue. Rapid heartbeat. Fainting, in severe cases. How is this diagnosed? This condition is diagnosed based on: Your medical history. Your symptoms. Your blood pressure measurement. Your health care provider will check your blood pressure when you are: Lying down. Sitting. Standing. A blood pressure reading is recorded as two numbers, such as "120 over 80" (or 120/80). The first ("top") number is called the systolic pressure. It is a measure of the pressure in your arteries as your heart beats. The second ("bottom") number is called the diastolic pressure. It is a measure of the pressure in your arteries when your heart relaxes between beats. Blood pressure is measured in a unit called mm Hg. Healthy blood pressure for most adults is 120/80. If your blood pressure is below 90/60, you may be diagnosed with hypotension. Other information or tests that may be used to diagnose orthostatic hypotension include: Your other vital signs, such as your heart rate and temperature. Blood tests. Tilt table test. For this test, you will be safely secured to a table that moves you from a lying position to an upright position. Your heart rhythm and blood pressure will be monitored during the test. How is this treated? This condition may be treated by: Changing your diet. This may involve eating more salt (sodium) or drinking more water. Taking medicines to raise your blood pressure. Changing the dosage of certain medicines you are taking that might be lowering your blood pressure. Wearing compression stockings. These stockings help to prevent blood clots and reduce swelling in your legs. In some cases, you may need to go to the hospital for: Fluid replacement. This means you will receive fluids through an  IV. Blood replacement. This means you will receive donated blood through an IV (transfusion). Treating an infection or heart problems, if this applies. Monitoring. You may need to be monitored while medicines that you are taking wear off. Follow these instructions at home: Eating and drinking Drink enough fluid to keep your urine pale yellow. Eat a healthy diet, and follow instructions from your health care provider about eating or drinking restrictions. A healthy diet includes: Fresh fruits and vegetables. Whole grains. Lean meats. Low-fat dairy products. Eat extra salt only as directed. Do not add extra salt to your diet unless your health care provider told you to do that. Eat frequent, small meals. Avoid standing up suddenly after eating.   Medicines Take over-the-counter and prescription medicines only as told by your health care provider. Follow instructions from your health care provider about changing the dosage of your current medicines, if this applies. Do not stop or adjust any of your medicines on your own. General instructions Wear compression stockings as told by your health care provider. Get up slowly from lying down or sitting positions. This gives your blood pressure a chance to adjust. Avoid hot showers and excessive heat as directed by your health care provider. Return to your normal activities  as told by your health care provider. Ask your health care provider what activities are safe for you. Do not use any products that contain nicotine or tobacco, such as cigarettes, e-cigarettes, and chewing tobacco. If you need help quitting, ask your health care provider. Keep all follow-up visits as told by your health care provider. This is important.   Contact a health care provider if you: Vomit. Have diarrhea. Have a fever for more than 2-3 days. Feel more thirsty than usual. Feel weak and tired. Get help right away if you: Have chest pain. Have a fast or irregular  heartbeat. Develop numbness in any part of your body. Cannot move your arms or your legs. Have trouble speaking. Become sweaty or feel light-headed. Faint. Feel short of breath. Have trouble staying awake. Feel confused. Summary Orthostatic hypotension is a sudden drop in blood pressure that happens when you quickly change positions. Orthostatic hypotension is usually not a serious problem. It is diagnosed by having your blood pressure taken lying down, sitting, and then standing. It may be treated by changing your diet or adjusting your medicines. This information is not intended to replace advice given to you by your health care provider. Make sure you discuss any questions you have with your health care provider. Document Revised: 08/12/2017 Document Reviewed: 08/12/2017 [employer] Patient Education  2021 [employer].   you might pass out (faint).  This condition is caused by a lack of blood flow to the brain.  Near-syncope may be a sign of a serious medical problem, so it is important to seek medical care. This information is not intended to replace advice given to you by your health care provider. Make sure you discuss any questions you have with your health care provider. Document Revised: 06/10/2018 Document Reviewed: 01/05/2018 Elsevier Patient Education  2021 [employer].

## 2020-03-29 NOTE — Plan of Care (Signed)

## 2020-03-29 NOTE — Progress Notes (Signed)
Pt initially refused bedtime dose of Metoprolol as he didn't think the increased overall dosing was discussed with Dr. Acie Fredrickson. BP 161/59 tonight after refusal. Pt educated. Pt then agreeable to bedtime dose.

## 2020-03-29 NOTE — Discharge Summary (Signed)
Physician Discharge Summary  AMOUS BOATENG [id number] DOB: Jul 23, 1940 DOA: 03/27/2020  PCP: Haywood Pao, MD  Admit date: 03/27/2020 Discharge date: 03/29/2020  Admitted From: Home Disposition:  home  Recommendations for Outpatient Follow-up:  1. Follow up with PCP in 1-2 weeks 2. Please obtain BMP in one week 3. Repeat lipid panel and liver function tests in 6 to 8 weeks  Home Health: No Equipment/Devices: None  Discharge Condition: Stable CODE STATUS: Full code Diet recommendation: Heart healthy  Brief/Interim Summary: CASSIAN LISZEWSKI is a 80 y.o.malewith medical history significant forCAD, status post TAVR, chronic diastolic CHF, CKD 3A, left renal mass, anxiety, and insulin-dependent diabetes mellitus, now presenting to the emergency department for evaluation of a near syncopal episode, elevated blood pressure, and elevated outpatient troponin. Patient reports he been in his usual state of health yesterday when he was shopping with his wife and experienced acute onset of the lightheadedness. Patient had been standing in a store when symptoms developed, was associated with some sweating, and he felt that he was going to pass out but did not fall or lose consciousness. He walked outside and began to feel better. He initially sought evaluation at an outpatient clinic where he was found to have systolic blood pressure in the low 200s. Blood work was ordered from the outpatient clinic which included cardiac enzymes. Patient was told that his troponin was elevated and instructed to proceed to the ED. He denies any chest pain, denies any change in mild chronic leg swelling, denies lightheadedness upon sitting up or standing, and denies any shortness of breath or cough.   Discharge Diagnoses:  Principal Problem:   Near syncope Active Problems:   Labile hypertension   Anxiety   CAD (coronary artery disease)   Chronic diastolic CHF (congestive heart failure) (HCC)    Diabetes mellitus type 2 in nonobese (HCC)   S/P TAVR (transcatheter aortic valve replacement)   Elevated troponin   Chronic kidney disease, stage 3a (HCC)   Orthostatic hypotension     Near syncope- Elevated Cr - Cr 1.26 and sodium 131- likely due to dehydration improved down to 1.02 this morning - given 500 cc NS bolus this AM but remained orthostatic after - second 500 cc NS bolus ordered. Orthostatic vitals are still positive  - 2 D ECHO reveals grade 1 d CHF - TEDs placed to help with orthostatic symptoms, continue compression stockings - will give 50 cc NS x 8 hrs overnight- repeat orthostatics in AM still orthostatic however was able to walk with physical therapy without getting lightheaded -Change to tamsulosin from doxazosin per cardiology    Uncontrolled hypertension - BP quite elevated this AM- 177/68, 198/81, 189/80 - Doubled his Toprol to 100 twice daily - cont to follow BP - Can keep blood pressure at about 160 as long as does not have anginal symptoms.    CAD (coronary artery disease) -Continue antianginals Imdur 30 mg daily, Lopressor 100 mg twice daily, Ranexa 500 mg twice daily avoid  increasing Imdur or calcium channel blockers -Continue Crestor, aspirin, statin -Follow-up with regular cardiology    Chronic diastolic CHF (congestive heart failure)  S/p TAVR - elevated troponin have remained < 30 - no chest pain - 2 D ECHO reveals no new abnormalities - cont ASA     Diabetes mellitus type 2 in nonobese (HCC) -Resume Lantus and Januvia  Hyperlipidemia -Continue Crestor with recent increase will need lipid panel and LFTs in 6 to 8 weeks  Renal cancer Follow-up  with urology this is supposedly scheduled in March   Discharge Instructions:  Discharge Instructions    Diet - low sodium heart healthy   Complete by: As directed    Increase activity slowly   Complete by: As directed      Allergies as of 03/29/2020      Reactions   Macrodantin  [nitrofurantoin] Other (See Comments)   "blocked kidneys"    Tape Other (See Comments)   "Tears my skin and leaves marks." PLEASE USE COBAN!!   Sulfa Antibiotics Rash   Lisinopril Cough   Nutritional Supplements Other (See Comments)   Protein drink given to me "did something"   Penicillins Other (See Comments)   08/30/19- Patient can't remember, was 35+ years ago. OK with trying cephalexin in hospital      Medication List    TAKE these medications   acetaminophen 325 MG tablet Commonly known as: TYLENOL Take 650 mg by mouth every 6 (six) hours as needed for mild pain.   aspirin 81 MG chewable tablet Chew 1 tablet (81 mg total) by mouth daily.   clonazePAM 0.5 MG tablet Commonly known as: KLONOPIN Take 0.5 mg by mouth daily.   FENOFIBRATE PO Take 135 mg by mouth daily.   insulin glargine 100 UNIT/ML injection Commonly known as: LANTUS Inject 60 Units into the skin daily.   isosorbide mononitrate 30 MG 24 hr tablet Commonly known as: IMDUR Take 1 tablet (30 mg total) by mouth daily.   losartan 100 MG tablet Commonly known as: COZAAR Take 1 tablet (100 mg total) by mouth daily.   magnesium oxide 400 (241.3 Mg) MG tablet Commonly known as: MAG-OX Take 1 tablet (400 mg total) by mouth daily.   magnesium oxide 400 MG tablet Commonly known as: MAG-OX Take 400 mg by mouth daily.   metoprolol tartrate 50 MG tablet Commonly known as: LOPRESSOR Take 2 tablets (100 mg total) by mouth 2 (two) times daily. What changed: how much to take   nitroGLYCERIN 0.4 MG SL tablet Commonly known as: NITROSTAT Place 1 tablet (0.4 mg total) under the tongue every 5 (five) minutes as needed for chest pain (up to 3 doses). Do not give if blood pressure is low (less than A999333 systolic).   glucose blood test strip 1 each by Other route as needed.   ONE TOUCH ULTRA TEST test strip Generic drug: glucose blood 1 each by Other route daily.   OneTouch Delica Plus 0000000 Misc 1 each by  Other route daily.   ranolazine 500 MG 12 hr tablet Commonly known as: RANEXA TAKE 1 TABLET BY MOUTH TWICE DAILY.   rosuvastatin 10 MG tablet Commonly known as: CRESTOR Take 1 tablet (10 mg total) by mouth daily.   sitaGLIPtin 100 MG tablet Commonly known as: JANUVIA Take 100 mg by mouth daily.       Follow-up Information    Tisovec, Fransico Him, MD Follow up.   Specialty: Internal Medicine Contact information: [place] [address] [phone]        Nahser, Wonda Cheng, MD .   Specialty: Cardiology Contact information: [place] [address] [phone]        Deboraha Sprang, MD .   Specialty: Cardiology Contact information: [id number] [address] [phone]        Sherren Mocha, MD .   Specialty: Cardiology Contact information: [phone] [address] [place] [address] [phone]  Allergies  Allergen Reactions  . Macrodantin [Nitrofurantoin] Other (See Comments)    "blocked kidneys"   . Tape Other (See Comments)    "Tears my skin and leaves marks." PLEASE USE COBAN!!  . Sulfa Antibiotics Rash  . Lisinopril Cough  . Nutritional Supplements Other (See Comments)    Protein drink given to me "did something"  . Penicillins Other (See Comments)    08/30/19- Patient can't remember, was 35+ years ago. OK with trying cephalexin in hospital    Consultations:  Cardiology   Procedures/Studies: DG Chest 2 View  Result Date: 03/27/2020 CLINICAL DATA:  Chest pain.  Elevated troponin levels. EXAM: CHEST - 2 VIEW COMPARISON:  Radiographs 12/17/2019 and 12/01/2019.  CT 11/15/2019. FINDINGS: The heart size and mediastinal contours are stable. There is mild aortic atherosclerosis post TAVR. The lungs appear stable without suspicious findings. There is a small calcified left upper lobe granuloma. No pleural effusion or pneumothorax. The bones appear  unremarkable. IMPRESSION: Stable chest. No active cardiopulmonary process. Electronically Signed   By: Richardean Sale M.D.   On: 03/27/2020 15:44   ECHOCARDIOGRAM COMPLETE  Result Date: 03/28/2020    ECHOCARDIOGRAM REPORT   Patient Name:   PIETRO BONURA Date of Exam: 03/28/2020 Medical Rec #:  [id number]      Height:       70.0 in Accession #:    [id number]     Weight:       186.2 lb Date of Birth:  [birth year]      BSA:          2.025 m Patient Age:    80 years       BP:           166/76 mmHg Patient Gender: M              HR:           83 bpm. Exam Location:  Inpatient Procedure: 2D Echo, Cardiac Doppler and Color Doppler Indications:    R55 Syncope  History:        Patient has prior history of Echocardiogram examinations, most                 recent 10/02/2019. CHF, Previous Myocardial Infarction and CAD,                 Stroke, TIA and Carotid Disease, Aortic Valve Disease; Risk                 Factors:Hypertension, Diabetes, Dyslipidemia and Sleep Apnea.                 Aortic Valve: 26 mm Sapien prosthetic, stented (TAVR) valve is                 present in the aortic position. Procedure Date: 08/15/2019.  Sonographer:    Jonelle Sidle Dance Referring Phys: [id number] [place]  1. Left ventricular ejection fraction, by estimation, is 60 to 65%. The left ventricle has normal function. The left ventricle demonstrates regional wall motion abnormalities with basal inferior hypokinesis. There is moderate left ventricular hypertrophy. Left ventricular diastolic parameters are consistent with Grade I diastolic dysfunction (impaired relaxation).  2. Right ventricular systolic function is normal. The right ventricular size is normal. Tricuspid regurgitation signal is inadequate for assessing PA pressure.  3. The mitral valve is normal in structure. No evidence of mitral valve regurgitation. No evidence of mitral stenosis.  4. Bioprosthetic aortic valve sp TAVR with 26 mm  Edwards Sapien valve. Mean gradient 6  mmHg (no significant stenois), no significant regurgitation noted.  5. The inferior vena cava is normal in size with greater than 50% respiratory variability, suggesting right atrial pressure of 3 mmHg. FINDINGS  Left Ventricle: Left ventricular ejection fraction, by estimation, is 60 to 65%. The left ventricle has normal function. The left ventricle demonstrates regional wall motion abnormalities. The left ventricular internal cavity size was normal in size. There is moderate left ventricular hypertrophy. Left ventricular diastolic parameters are consistent with Grade I diastolic dysfunction (impaired relaxation). Right Ventricle: The right ventricular size is normal. No increase in right ventricular wall thickness. Right ventricular systolic function is normal. Tricuspid regurgitation signal is inadequate for assessing PA pressure. Left Atrium: Left atrial size was normal in size. Right Atrium: Right atrial size was normal in size. Pericardium: There is no evidence of pericardial effusion. Mitral Valve: The mitral valve is normal in structure. There is mild calcification of the mitral valve leaflet(s). Mild mitral annular calcification. No evidence of mitral valve regurgitation. No evidence of mitral valve stenosis. Tricuspid Valve: The tricuspid valve is normal in structure. Tricuspid valve regurgitation is not demonstrated. Aortic Valve: Bioprosthetic aortic valve sp TAVR with 26 mm Edwards Sapien valve. Mean gradient 6 mmHg (no significant stenois), no significant regurgitation noted. The aortic valve has been repaired/replaced. Aortic valve regurgitation is not visualized. Aortic valve mean gradient measures 6.0 mmHg. Aortic valve peak gradient measures 12.2 mmHg. Aortic valve area, by VTI measures 2.60 cm. There is a 26 mm Sapien prosthetic, stented (TAVR) valve present in the aortic position. Procedure Date:  08/15/2019. Pulmonic Valve: The pulmonic valve was normal in structure. Pulmonic valve  regurgitation is not visualized. Aorta: The aortic root is normal in size and structure. Venous: The inferior vena cava is normal in size with greater than 50% respiratory variability, suggesting right atrial pressure of 3 mmHg. IAS/Shunts: No atrial level shunt detected by color flow Doppler.  LEFT VENTRICLE PLAX 2D LVIDd:         4.40 cm  Diastology LVIDs:         3.10 cm  LV e' medial:    4.10 cm/s LV PW:         1.20 cm  LV E/e' medial:  16.0 LV IVS:        1.60 cm  LV e' lateral:   6.47 cm/s LVOT diam:     2.45 cm  LV E/e' lateral: 10.1 LV SV:         83 LV SV Index:   41 LVOT Area:     4.71 cm  RIGHT VENTRICLE             IVC RV Basal diam:  2.20 cm     IVC diam: 1.50 cm RV S prime:     14.50 cm/s TAPSE (M-mode): 2.5 cm LEFT ATRIUM             Index       RIGHT ATRIUM           Index LA diam:        4.20 cm 2.07 cm/m  RA Area:     10.20 cm LA Vol (A2C):   48.3 ml 23.85 ml/m RA Volume:   15.20 ml  7.51 ml/m LA Vol (A4C):   37.8 ml 18.67 ml/m LA Biplane Vol: 43.2 ml 21.33 ml/m  AORTIC VALVE AV Area (Vmax):    2.31 cm AV Area (Vmean):   2.64 cm AV  Area (VTI):     2.60 cm AV Vmax:           175.00 cm/s AV Vmean:          116.000 cm/s AV VTI:            0.317 m AV Peak Grad:      12.2 mmHg AV Mean Grad:      6.0 mmHg LVOT Vmax:         85.80 cm/s LVOT Vmean:        64.900 cm/s LVOT VTI:          0.175 m LVOT/AV VTI ratio: 0.55  AORTA Ao Asc diam: 2.20 cm MITRAL VALVE MV Area (PHT): 2.66 cm     SHUNTS MV Decel Time: 285 msec     Systemic VTI:  0.18 m MV E velocity: 65.60 cm/s   Systemic Diam: 2.45 cm MV A velocity: 120.00 cm/s MV E/A ratio:  0.55 Loralie Champagne MD Electronically signed by Loralie Champagne MD Signature Date/Time: 03/28/2020/6:10:48 PM    Final        Subjective: Feels much better no repeat episodes of presyncope.  Discharge Exam: Vitals:   03/29/20 0911 03/29/20 1112  BP: (!) 175/64 (!) 146/64  Pulse: 69 (!) 59  Resp: 18 16  Temp: (!) 97.5 F (36.4 C) 98 F (36.7 C)  SpO2: 98%  98%   Vitals:   03/29/20 0601 03/29/20 0604 03/29/20 0911 03/29/20 1112  BP: (!) 141/67 (!) 155/69 (!) 175/64 (!) 146/64  Pulse: 66 70 69 (!) 59  Resp:   18 16  Temp:   (!) 97.5 F (36.4 C) 98 F (36.7 C)  TempSrc:   Oral   SpO2: 98% 98% 98% 98%  Weight:  81.9 kg    Height:        General: Pt is alert, awake, not in acute distress Cardiovascular: RRR, S1/S2 +, no rubs, no gallops Respiratory: CTA bilaterally, no wheezing, no rhonchi Abdominal: Soft, NT, ND, bowel sounds + Extremities: no edema, no cyanosis    The results of significant diagnostics from this hospitalization (including imaging, microbiology, ancillary and laboratory) are listed below for reference.     Microbiology: Recent Results (from the past 240 hour(s))  SARS CORONAVIRUS 2 (TAT 6-24 HRS) Nasopharyngeal Nasopharyngeal Swab     Status: None   Collection Time: 03/27/20  9:49 PM   Specimen: Nasopharyngeal Swab  Result Value Ref Range Status   SARS Coronavirus 2 NEGATIVE NEGATIVE Final    Comment: (NOTE) SARS-CoV-2 target nucleic acids are NOT DETECTED.  The SARS-CoV-2 RNA is generally detectable in upper and lower respiratory specimens during the acute phase of infection. Negative results do not preclude SARS-CoV-2 infection, do not rule out co-infections with other pathogens, and should not be used as the sole basis for treatment or other patient management decisions. Negative results must be combined with clinical observations, patient history, and epidemiological information. The expected result is Negative.  Fact Sheet for Patients: SugarRoll.be  Fact Sheet for Healthcare Providers: https://www.woods-mathews.com/  This test is not yet approved or cleared by the [place] FDA and  has been authorized for detection and/or diagnosis of SARS-CoV-2 by FDA under an Emergency Use Authorization (EUA). This EUA will remain  in effect (meaning this test can  be used) for the duration of the COVID-19 declaration under Se ction 564(b)(1) of the Act, 21 U.S.C. section 360bbb-3(b)(1), unless the authorization is terminated or revoked sooner.  Performed at [facility]  Lab, [address], [place], [place] [address]      Labs: BNP (last 3 results) Recent Labs    08/11/19 0844 09/08/19 2139 09/09/19 0308  BNP 238.5* 222.2* A999333*   Basic Metabolic Panel: Recent Labs  Lab 03/27/20 1501 03/28/20 0453 03/29/20 0517  NA 131* 138 138  K 3.8 3.4* 3.8  CL 96* 102 104  CO2 23 26 24   GLUCOSE 266* 142* 117*  BUN 22 15 19   CREATININE 1.26* 1.00 1.02  CALCIUM 9.8 9.5 9.4  MG  --  1.8  --    CBC: Recent Labs  Lab 03/27/20 1501 03/28/20 0453 03/29/20 0517  WBC 6.3 8.6 8.6  HGB 15.1 15.7 14.9  HCT 44.1 48.1 45.1  MCV 84.5 85.9 86.1  PLT 160 158 149*   CBG: Recent Labs  Lab 03/28/20 1717 03/28/20 2237 03/29/20 0553 03/29/20 1113 03/29/20 1610  GLUCAP 154* 144* 107* 135* 123*    Hgb A1c Recent Labs    03/28/20 0453  HGBA1C 6.1*   Urinalysis    Component Value Date/Time   COLORURINE COLORLESS (A) 03/27/2020 2002   APPEARANCEUR CLEAR 03/27/2020 2002   LABSPEC 1.005 03/27/2020 2002   PHURINE 7.0 03/27/2020 2002   GLUCOSEU 150 (A) 03/27/2020 2002   HGBUR NEGATIVE 03/27/2020 2002   BILIRUBINUR NEGATIVE 03/27/2020 2002   KETONESUR NEGATIVE 03/27/2020 2002   PROTEINUR 100 (A) 03/27/2020 2002   NITRITE NEGATIVE 03/27/2020 2002   LEUKOCYTESUR NEGATIVE 03/27/2020 2002   Sepsis Labs Invalid input(s): PROCALCITONIN,  WBC,  LACTICIDVEN Microbiology Recent Results (from the past 240 hour(s))  SARS CORONAVIRUS 2 (TAT 6-24 HRS) Nasopharyngeal Nasopharyngeal Swab     Status: None   Collection Time: 03/27/20  9:49 PM   Specimen: Nasopharyngeal Swab  Result Value Ref Range Status   SARS Coronavirus 2 NEGATIVE NEGATIVE Final    Comment: (NOTE) SARS-CoV-2 target nucleic acids are NOT DETECTED.  The SARS-CoV-2 RNA is  generally detectable in upper and lower respiratory specimens during the acute phase of infection. Negative results do not preclude SARS-CoV-2 infection, do not rule out co-infections with other pathogens, and should not be used as the sole basis for treatment or other patient management decisions. Negative results must be combined with clinical observations, patient history, and epidemiological information. The expected result is Negative.  Fact Sheet for Patients: SugarRoll.be  Fact Sheet for Healthcare Providers: https://www.woods-mathews.com/  This test is not yet approved or cleared by the [place] FDA and  has been authorized for detection and/or diagnosis of SARS-CoV-2 by FDA under an Emergency Use Authorization (EUA). This EUA will remain  in effect (meaning this test can be used) for the duration of the COVID-19 declaration under Se ction 564(b)(1) of the Act, 21 U.S.C. section 360bbb-3(b)(1), unless the authorization is terminated or revoked sooner.  Performed at [facility], [place] [address], [place], [place] [address]      Time coordinating discharge: Over 30 minutes  SIGNED:   Donnamae Jude, MD  [facility] 03/29/2020, 6:08 PM  If 7PM-7AM, please contact night-coverage

## 2020-03-29 NOTE — Evaluation (Signed)
Physical Therapy Evaluation Patient Details Name: Alexis Griffith MRN: [id number] DOB: [birth year] Today's Date: 03/29/2020   History of Present Illness  80yo male who was referred to the ED by an OP clinic due to pre-syncopal episode, elevated SBP, and high troponins. Admitted for w/u of near syncopal event. PMH CAD, CHF, DM, HLD, HTN, NSTEMI, s/p TAVR, CVA/TIA  Clinical Impression   Patient received in bed, very anxious regarding his medications and BP management today; reassured and provided empathetic listening, very talkative and difficult to redirect today. Able to mobilize on an independent to Mod(I) basis with no device. Does demonstrate some chronic weakness and mild balance impairment and would benefit from skilled OP PT f/u moving forward. Left up in recliner with all needs met this afternoon.     Follow Up Recommendations Outpatient PT    Equipment Recommendations  None recommended by PT    Recommendations for Other Services       Precautions / Restrictions Precautions Precautions: Fall Restrictions Weight Bearing Restrictions: No      Mobility  Bed Mobility Overal bed mobility: Independent                  Transfers Overall transfer level: Independent Equipment used: None             General transfer comment: no physical assist given  Ambulation/Gait Ambulation/Gait assistance: Modified independent (Device/Increase time) Gait Distance (Feet): 300 Feet Assistive device: None Gait Pattern/deviations: WFL(Within Functional Limits);Step-through pattern;Narrow base of support Gait velocity: decreased   General Gait Details: slow and steady in hallway, occasionally reaches out for items like railing but does not need it to maintain his balance  Stairs            Wheelchair Mobility    Modified Rankin (Stroke Patients Only)       Balance Overall balance assessment: Mild deficits observed, not formally tested                                            Pertinent Vitals/Pain Pain Assessment: No/denies pain    Home Living Family/patient expects to be discharged to:: Private residence Living Arrangements: Spouse/significant other Available Help at Discharge: Family;Available 24 hours/day Type of Home: House Home Access: Stairs to enter Entrance Stairs-Rails: None Entrance Stairs-Number of Steps: 1 step (in the house) Home Layout: One level Home Equipment: Walker - 2 wheels;Grab bars - tub/shower Additional Comments: pt reports he may have a RW from his daughter, but otherwise no equipment. Reports he is about to have grab bars put in his shower    Prior Function Level of Independence: Independent         Comments: driving     Hand Dominance   Dominant Hand: Right    Extremity/Trunk Assessment   Upper Extremity Assessment Upper Extremity Assessment: Overall WFL for tasks assessed    Lower Extremity Assessment Lower Extremity Assessment: Overall WFL for tasks assessed    Cervical / Trunk Assessment Cervical / Trunk Assessment: Normal  Communication   Communication: No difficulties  Cognition Arousal/Alertness: Awake/alert Behavior During Therapy: WFL for tasks assessed/performed;Anxious Overall Cognitive Status: Within Functional Limits for tasks assessed                                 General Comments: very anxious and perseverating on  one of his medications not being documented and worrying about getting his BP in check before going home      General Comments      Exercises     Assessment/Plan    PT Assessment Patient needs continued PT services  PT Problem List Decreased strength;Decreased mobility;Decreased balance       PT Treatment Interventions Balance training;Gait training;Stair training;Functional mobility training;Patient/family education;Therapeutic activities;Therapeutic exercise    PT Goals (Current goals can be found in the Care Plan section)   Acute Rehab PT Goals Patient Stated Goal: go home with BP managed PT Goal Formulation: With patient Time For Goal Achievement: 04/12/20 Potential to Achieve Goals: Good    Frequency Min 2X/week   Barriers to discharge        Co-evaluation               AM-PAC PT "6 Clicks" Mobility  Outcome Measure Help needed turning from your back to your side while in a flat bed without using bedrails?: None Help needed moving from lying on your back to sitting on the side of a flat bed without using bedrails?: None Help needed moving to and from a bed to a chair (including a wheelchair)?: None Help needed standing up from a chair using your arms (e.g., wheelchair or bedside chair)?: None Help needed to walk in hospital room?: A Little Help needed climbing 3-5 steps with a railing? : A Little 6 Click Score: 22    End of Session Equipment Utilized During Treatment: Gait belt Activity Tolerance: Patient tolerated treatment well Patient left: in chair;with call bell/phone within reach Nurse Communication: Mobility status PT Visit Diagnosis: Muscle weakness (generalized) (M62.81);Unsteadiness on feet (R26.81)    Time: 1325-1407 PT Time Calculation (min) (ACUTE ONLY): 42 min   Charges:   PT Evaluation $PT Eval Moderate Complexity: 1 Mod PT Treatments $Gait Training: 8-22 mins $Therapeutic Activity: 8-22 mins        Windell Norfolk, DPT, PN1   Supplemental Physical Therapist [place]    Pager [phone] Acute Rehab Office [phone]

## 2020-03-29 NOTE — Consult Note (Addendum)
Cardiology Consultation:   Patient ID: Alexis Griffith MRN: [id number]; DOB: 08/19/1940  Admit date: 03/27/2020 Date of Consult: 03/29/2020  Primary Care Provider: Gaspar Garbeisovec, Richard W, MD [facility] Cardiologist: Kristeen MissPhilip Nahser, MD [facility] Electrophysiologist:  Sherryl MangesSteven Klein, MD    Patient Profile:   Alexis Griffith is a 80 y.o. 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 Shawnie PonsPratt.  History of Present Illness:   Alexis Griffith is a 80 year old male with the above history who is followed by Dr. Elease HashimotoNahser. Patient underwent TAVR on 08/15/2019. Cardiac catheterization prior to valve repair showed severe 3 vessel CAD with 100% CTO of ostial RCA (with collaterals), a long 70% calcified stenosis of proximal to mid LAD (positive by FFR), and about 75% stenosis proximal mid LCX (positive by FFR). Pre-TAVR CT scans incidentally showed a left renal mass suspicious for primary renal cell carcinoma as well as several small pulmonary nodules. Patient was evaluated by multidisciplinary valve team and decision made to proceed with TAVR and treat CAD medically so that presumed renal cancer could then be addressed. Patient had a prolonged hospital course after TAVR due to issues with acute urinary retention requiring foley, hematuria, AKI, UTI/epididymoorchitis requiring antibiotic, brain MRI 4mm infarct felt to be incidental/procedural related, and orthostatic hypotension with recurrent syncope requiring midodrine, Mestinon, and short-term Florinef. He was discharged to [facility]. He had multiple readmission following procedure for chest pain and markedly  elevated BP. During one of these admissions in 08/2019, he rule in for NSTEMI (high-sensitivity troponin peaked in the 3,000s. Case was reviewed with Dr. Excell Seltzerooper and Dr. Clifton JamesMcAlhany and conservative approach was undertaken given that if he were to have PCI given recent gross hematuria that required cessation of Plavix. Anti-anginals/anti-hypertensives were titrated and his orthostatic agents were able to be discontinued. He was admitted again in 10/2019 with chest pain and syncope. Ranexa was added to antianginal regimen. Syncope was felt to be vasovagal in nature. Event monitor was ordered but it does not look like this was done. Last admission was in 12/2019 for progressive angina and elevated BP again after skipping several of his medications. High-sensitivity troponin was minimally elevated and flat. Not felt to represent ACS. Importance of medication compliance was discussed. Patient was last seen by Dr. Elease HashimotoNahser on 12/28/2019 and was doing well at that time.   Patient presented to the ED on 03/27/2020 for further evaluation after troponin at outpatient primary care office came back elevated. [facility] when he all of a sudden started to feel lightheaded and dizzy.  He has some significant anxiety and thought this was due to feeling claustrophobic.  He made his way out to the car and felt better.  No syncope.  He is friends with a physician that works close by [facility] at a primary care office so he decided to run by there to see if he could be seen.  His friend was not there but he swallowed PA.  His BP was reportedly markedly elevated with systolic BP in the 200s.  Labs were drawn and clonidine was prescribed.  Patient never filled the clonidine  because he wanted to get Dr. Elmarie Shiley opinion first.  He received a call the next day from the PA he saw with reports that his troponin was elevated.  He was advised to go to the ED for further evaluation.  In  the ED he reported some intermittent chest pain.  Patient has history of chest pain normally when his BP is elevated.  He does take sublingual nitro at home but states this does not help.  He feels like he is requiring nitro more often.  The more I talk to him more it sounds like his chest pain occurs after he checks his blood pressure and notices that it is high and then gets anxious.  Chest pain occurs at rest.  He does not mention any exertional chest pain.  No shortness of breath, orthopnea, PND, lower extremity edema.  He reports some recent nasal congestion and cough but no fevers.  He reports occasional nausea and abdominal pain but states this is not new.  He denies any abnormal bleeding in urine or stools including hematuria, hematochezia, or melena.  In the ED, patient markedly hypertensive with BP of 208/77. Otherwise, vitals stable. EKG showed normal sinus rhythm with no acute ischemic changes. High-sensitivity troponin minimally elevated and flat at 25 >> 29. Chest x-ray showed no acute findings. WBC 6.3, Hgb 15.1, Plts 160. Na 131, K 3.8, Glucose 266, BUN 22, Cr 1.26. COVID-19 testing negative. Patient was admitted for further evaluation/management and Cardiology was consulted for further evaluation. Echo this admission showed LVEF of 60-65% with basal inferior hypokinesis and grade 1 diastolic dysfunction. TAVR valve present with no significant regurgitation or stenosis (mean gradient 6 mmHg).  At the time of this evaluation, he is resting comfortably in no acute distress.  He is anxious about making sure he is on the right blood pressure medication`s. No angina.  His home Lopressor was increased to 100 mg twice a day yesterday.  He is now orthostatic this morning with systolic BP dropping from 171 with supine to 141 when standing.  In regards to his presumed renal cancer, he follows with Dr. Tresa Moore (Urology).  He states Dr. Tresa Moore is planning on doing repeat scans in the next couple months and  then possibly planning a nephrectomy.  He reportedly did not want to biopsy the mass due to fear of spreading the malignancy.   Past Medical History:  Diagnosis Date   Anxiety    Bladder neck obstruction 06/05/2013   CAD (coronary artery disease)    Carotid artery disease (HCC)    CTA 08/2019 of the head and neck show moderate 65 to 75% bilateral ICA stenoses    Chronic diastolic CHF (congestive heart failure) (HCC)    Diabetes mellitus type 2 in nonobese (HCC)    Excessive urination at night 06/05/2013   Giant cell arteritis ([place]) 05/17/2012   Hyperlipidemia    Hypertension    Hypertensive urgency    Hypomagnesemia 09/11/2019   Mild anemia 09/11/2019   NSTEMI (non-ST elevated myocardial infarction) ([place]) 09/09/2019   Orthostatic hypotension 09/11/2019   Premature atrial contractions 09/12/2019   Pulmonary nodules    PVC (premature ventricular contraction)    Renal mass    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   s/p TAVR with 26 mm Edwards S3U via TF approach by Dr. Burt Knack & Dr. 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 Wilburn Cornelia.   Stroke [facility])    TIA (transient ischemic attack)    Urinary retention 09/11/2019    Past Surgical History:  Procedure Laterality Date   CATARACT EXTRACTION, BILATERAL  10/2015, and 11/2015   with adding  TORIC lenses bilaterally    INTRAVASCULAR PRESSURE WIRE/FFR STUDY N/A 07/07/2019   Procedure: INTRAVASCULAR PRESSURE WIRE/FFR STUDY;  Surgeon: [name], MD;  Location: [facility];  Service: Cardiovascular;  Laterality: N/A;   LEFT HEART CATH AND CORONARY ANGIOGRAPHY N/A 07/07/2019   Procedure: LEFT HEART CATH AND CORONARY ANGIOGRAPHY;  Surgeon: [name], MD;  Location: [place] [facility];  Service: Cardiovascular;  Laterality: N/A;   SQUAMOUS CELL CARCINOMA EXCISION     tongue   TEE WITHOUT CARDIOVERSION  N/A 08/15/2019   Procedure: TRANSESOPHAGEAL ECHOCARDIOGRAM (TEE);  Surgeon: Sherren Mocha, MD;  Location: [place];  Service: Open Heart Surgery;  Laterality: N/A;   TRANSCATHETER AORTIC VALVE REPLACEMENT, TRANSFEMORAL N/A 08/15/2019   Procedure: TRANSCATHETER AORTIC VALVE REPLACEMENT, TRANSFEMORAL using Edwards [employer] SAPIEN 3 Ultra 26 mm THV.;  Surgeon: Sherren Mocha, MD;  Location: [place];  Service: Open Heart Surgery;  Laterality: N/A;     Home Medications:  Prior to Admission medications   Medication Sig Start Date End Date Taking? Authorizing Provider  acetaminophen (TYLENOL) 325 MG tablet Take 650 mg by mouth every 6 (six) hours as needed for mild pain.   Yes [provider]  aspirin 81 MG chewable tablet Chew 1 tablet (81 mg total) by mouth daily. 10/03/19  Yes Hall, Carole N, DO  clonazePAM (KLONOPIN) 0.5 MG tablet Take 0.5 mg by mouth daily.   Yes [provider]  FENOFIBRATE PO Take 135 mg by mouth daily.    Yes [provider]  insulin glargine (LANTUS) 100 UNIT/ML injection Inject 60 Units into the skin daily.   Yes [provider]  isosorbide mononitrate (IMDUR) 30 MG 24 hr tablet Take 1 tablet (30 mg total) by mouth daily. 02/29/20  Yes Nahser, Wonda Cheng, MD  losartan (COZAAR) 100 MG tablet Take 1 tablet (100 mg total) by mouth daily. 01/22/20  Yes Nahser, Wonda Cheng, MD  magnesium oxide (MAG-OX) 400 (241.3 Mg) MG tablet Take 1 tablet (400 mg total) by mouth daily. 09/24/19  Yes Barrett, Evelene Croon, PA-C  magnesium oxide (MAG-OX) 400 MG tablet Take 400 mg by mouth daily.   Yes [provider]  metoprolol tartrate (LOPRESSOR) 50 MG tablet Take 1 tablet (50 mg total) by mouth 2 (two) times daily. 02/29/20  Yes Nahser, Wonda Cheng, MD  nitroGLYCERIN (NITROSTAT) 0.4 MG SL tablet Place 1 tablet (0.4 mg total) under the tongue every 5 (five) minutes as needed for chest pain (up to 3 doses). Do not give if blood pressure is low (less than [id number] systolic).  02/05/20  Yes Nahser, Wonda Cheng, MD  ranolazine (RANEXA) 500 MG 12 hr tablet TAKE 1 TABLET BY MOUTH TWICE DAILY. Patient taking differently: Take 500 mg by mouth 2 (two) times daily. 01/31/20  Yes Nahser, Wonda Cheng, MD  rosuvastatin (CRESTOR) 10 MG tablet Take 1 tablet (10 mg total) by mouth daily. 11/20/19 11/14/20 Yes Nahser, Wonda Cheng, MD  sitaGLIPtin (JANUVIA) 100 MG tablet Take 100 mg by mouth daily.   Yes [provider]  glucose blood test strip 1 each by Other route as needed.  05/12/12   [provider]  Lancets (ONETOUCH DELICA PLUS [id number]) [place] 1  each by Other route daily.  03/23/18   [provider]  ONE TOUCH ULTRA TEST test strip 1 each by Other route daily.  05/12/12   [provider]    Inpatient Medications: Scheduled Meds:  aspirin  81 mg Oral Daily   clonazePAM  0.5 mg Oral Daily   enoxaparin (LOVENOX) injection  40 mg Subcutaneous Q24H   insulin aspart  0-5 Units Subcutaneous QHS   insulin aspart  0-9 Units Subcutaneous TID WC   insulin glargine  40 Units Subcutaneous Daily   isosorbide mononitrate  30 mg Oral Daily   losartan  100 mg Oral Daily   metoprolol tartrate  100 mg Oral BID   ranolazine  500 mg Oral BID   rosuvastatin  10 mg Oral Daily   sodium chloride flush  3 mL Intravenous Q12H   Continuous Infusions:  sodium chloride     PRN Meds: sodium chloride, acetaminophen **OR** acetaminophen, ondansetron **OR** ondansetron (ZOFRAN) IV, senna-docusate  Allergies:    Allergies  Allergen Reactions   Macrodantin [Nitrofurantoin] Other (See Comments)    "blocked kidneys"    Tape Other (See Comments)    "Tears my skin and leaves marks." PLEASE USE COBAN!!   Sulfa Antibiotics Rash   Lisinopril Cough   Nutritional Supplements Other (See Comments)    Protein drink given to me "did something"   Penicillins Other (See Comments)    08/30/19- Patient can't remember, was 35+ years ago. OK with trying cephalexin in hospital    Social  History:   Social History   Socioeconomic History   Marital status: Married    Spouse name: Not on file   Number of children: 2   Years of education: Not on file   Highest education level: Not on file  Occupational History   Not on file  Tobacco Use   Smoking status: Former Smoker    Quit date: 06/25/1975    Years since quitting: 44.7   Smokeless tobacco: Never Used  Vaping Use   Vaping Use: Never used  Substance and Sexual Activity   Alcohol use: No   Drug use: No   Sexual activity: Yes  Other Topics Concern   Not on file  Social History Narrative   Patient lives at home with his wife.    Social Determinants of Health   Financial Resource Strain: Not on file  Food Insecurity: Not on file  Transportation Needs: Not on file  Physical Activity: Not on file  Stress: Not on file  Social Connections: Not on file  Intimate Partner Violence: Not on file    Family History:    Family History  Problem Relation Age of Onset   Heart disease Mother    Heart attack Mother    Heart disease Father    Heart attack Father      ROS:  Please see the history of present illness.  All other ROS reviewed and negative.     Physical Exam/Data:   Vitals:   03/29/20 0601 03/29/20 0604 03/29/20 0911 03/29/20 1112  BP: (!) 141/67 (!) 155/69 (!) 175/64 (!) 146/64  Pulse: 66 70 69 (!) 59  Resp:   18 16  Temp:   (!) 97.5 F (36.4 C) 98 F (36.7 C)  TempSrc:   Oral   SpO2: 98% 98% 98% 98%  Weight:  81.9 kg    Height:        Intake/Output Summary (Last 24 hours) at 03/29/2020 1658 Last data filed at 03/29/2020  1615 Gross per 24 hour  Intake 1343.19 ml  Output 525 ml  Net 818.19 ml   Last 3 Weights 03/29/2020 03/28/2020 03/28/2020  Weight (lbs) 180 lb 9.6 oz 180 lb 12.8 oz 180 lb 12.8 oz  Weight (kg) 81.92 kg 82.01 kg 82.01 kg     Body mass index is 25.91 kg/m.  General: 80 y.o. male resting comfortably in no acute distress. HEENT: Normocephalic and atraumatic. Sclera clear.   Neck: Supple. Bilateral carotid bruits (right > left). No JVD. Heart: RRR. Distinct S1 and S2. No significant murmurs, gallops, or rubs. Radial and distal pedal pulses 2+ and equal bilaterally. Lungs: No increased work of breathing. Clear to ausculation bilaterally. No wheezes, rhonchi, or rales.  Abdomen: Soft, non-distended, and non-tender to palpation. Bowel sounds present. MSK: Normal strength and tone for age. Extremities: No lower extremity edema.    Skin: Warm and dry. Neuro: Alert and oriented x3. No focal deficits. Psych: Normal affect. Responds appropriately.  EKG:  The EKG was personally reviewed and demonstrates:  Normal sinus rhythm, rate 65 bpm, with non-specific ST/T changes.   Telemetry:  Telemetry was personally reviewed and demonstrates: Normal sinus rhythm with rates in the 50's to 70's.   Relevant CV Studies:  Echocardiogram 03/28/2020: Impressions:  1. Left ventricular ejection fraction, by estimation, is 60 to 65%. The  left ventricle has normal function. The left ventricle demonstrates  regional wall motion abnormalities with basal inferior hypokinesis. There  is moderate left ventricular  hypertrophy. Left ventricular diastolic parameters are consistent with  Grade I diastolic dysfunction (impaired relaxation).   2. Right ventricular systolic function is normal. The right ventricular  size is normal. Tricuspid regurgitation signal is inadequate for assessing  PA pressure.   3. The mitral valve is normal in structure. No evidence of mitral valve  regurgitation. No evidence of mitral stenosis.   4. Bioprosthetic aortic valve sp TAVR with 26 mm Edwards Sapien valve.  Mean gradient 6 mmHg (no significant stenois), no significant  regurgitation noted.   5. The inferior vena cava is normal in size with greater than 50%  respiratory variability, suggesting right atrial pressure of 3 mmHg.   Laboratory Data:  High Sensitivity Troponin:   Recent Labs  Lab  03/27/20 1501 03/27/20 1650  TROPONINIHS 25* 29*     Chemistry Recent Labs  Lab 03/27/20 1501 03/28/20 0453 03/29/20 0517  NA 131* 138 138  K 3.8 3.4* 3.8  CL 96* 102 104  CO2 23 26 24   GLUCOSE 266* 142* 117*  BUN 22 15 19   CREATININE 1.26* 1.00 1.02  CALCIUM 9.8 9.5 9.4  GFRNONAA 58* >60 >60  ANIONGAP 12 10 10     No results for input(s): PROT, ALBUMIN, AST, ALT, ALKPHOS, BILITOT in the last 168 hours. Hematology Recent Labs  Lab 03/27/20 1501 03/28/20 0453 03/29/20 0517  WBC 6.3 8.6 8.6  RBC 5.22 5.60 5.24  HGB 15.1 15.7 14.9  HCT 44.1 48.1 45.1  MCV 84.5 85.9 86.1  MCH 28.9 28.0 28.4  MCHC 34.2 32.6 33.0  RDW 13.6 13.5 13.6  PLT 160 158 149*   BNPNo results for input(s): BNP, PROBNP in the last 168 hours.  DDimer No results for input(s): DDIMER in the last 168 hours.   Radiology/Studies:  DG Chest 2 View  Result Date: 03/27/2020 CLINICAL DATA:  Chest pain.  Elevated troponin levels. EXAM: CHEST - 2 VIEW COMPARISON:  Radiographs 12/17/2019 and 12/01/2019.  CT 11/15/2019. FINDINGS: The heart size and mediastinal contours  are stable. There is mild aortic atherosclerosis post TAVR. The lungs appear stable without suspicious findings. There is a small calcified left upper lobe granuloma. No pleural effusion or pneumothorax. The bones appear unremarkable. IMPRESSION: Stable chest. No active cardiopulmonary process. Electronically Signed   By: Richardean Sale M.D.   On: 03/27/2020 15:44   ECHOCARDIOGRAM COMPLETE  Result Date: 03/28/2020    ECHOCARDIOGRAM REPORT   Patient Name:   ANGELINA NEECE Date of Exam: 03/28/2020 Medical Rec #:  [id number]      Height:       70.0 in Accession #:    [id number]     Weight:       186.2 lb Date of Birth:  [birth year]      BSA:          2.025 m Patient Age:    31 years       BP:           166/76 mmHg Patient Gender: M              HR:           83 bpm. Exam Location:  Inpatient Procedure: 2D Echo, Cardiac Doppler and Color Doppler  Indications:    R55 Syncope  History:        Patient has prior history of Echocardiogram examinations, most                 recent 10/02/2019. CHF, Previous Myocardial Infarction and CAD,                 Stroke, TIA and Carotid Disease, Aortic Valve Disease; Risk                 Factors:Hypertension, Diabetes, Dyslipidemia and Sleep Apnea.                 Aortic Valve: 26 mm Sapien prosthetic, stented (TAVR) valve is                 present in the aortic position. Procedure Date: 08/15/2019.  Sonographer:    Jonelle Sidle Dance Referring Phys: [id number] [place]  1. Left ventricular ejection fraction, by estimation, is 60 to 65%. The left ventricle has normal function. The left ventricle demonstrates regional wall motion abnormalities with basal inferior hypokinesis. There is moderate left ventricular hypertrophy. Left ventricular diastolic parameters are consistent with Grade I diastolic dysfunction (impaired relaxation).  2. Right ventricular systolic function is normal. The right ventricular size is normal. Tricuspid regurgitation signal is inadequate for assessing PA pressure.  3. The mitral valve is normal in structure. No evidence of mitral valve regurgitation. No evidence of mitral stenosis.  4. Bioprosthetic aortic valve sp TAVR with 26 mm Edwards Sapien valve. Mean gradient 6 mmHg (no significant stenois), no significant regurgitation noted.  5. The inferior vena cava is normal in size with greater than 50% respiratory variability, suggesting right atrial pressure of 3 mmHg. FINDINGS  Left Ventricle: Left ventricular ejection fraction, by estimation, is 60 to 65%. The left ventricle has normal function. The left ventricle demonstrates regional wall motion abnormalities. The left ventricular internal cavity size was normal in size. There is moderate left ventricular hypertrophy. Left ventricular diastolic parameters are consistent with Grade I diastolic dysfunction (impaired relaxation). Right  Ventricle: The right ventricular size is normal. No increase in right ventricular wall thickness. Right ventricular systolic function is normal. Tricuspid regurgitation signal is inadequate for assessing PA pressure. Left Atrium: Left atrial size was  normal in size. Right Atrium: Right atrial size was normal in size. Pericardium: There is no evidence of pericardial effusion. Mitral Valve: The mitral valve is normal in structure. There is mild calcification of the mitral valve leaflet(s). Mild mitral annular calcification. No evidence of mitral valve regurgitation. No evidence of mitral valve stenosis. Tricuspid Valve: The tricuspid valve is normal in structure. Tricuspid valve regurgitation is not demonstrated. Aortic Valve: Bioprosthetic aortic valve sp TAVR with 26 mm Edwards Sapien valve. Mean gradient 6 mmHg (no significant stenois), no significant regurgitation noted. The aortic valve has been repaired/replaced. Aortic valve regurgitation is not visualized. Aortic valve mean gradient measures 6.0 mmHg. Aortic valve peak gradient measures 12.2 mmHg. Aortic valve area, by VTI measures 2.60 cm. There is a 26 mm Sapien prosthetic, stented (TAVR) valve present in the aortic position. Procedure Date:  08/15/2019. Pulmonic Valve: The pulmonic valve was normal in structure. Pulmonic valve regurgitation is not visualized. Aorta: The aortic root is normal in size and structure. Venous: The inferior vena cava is normal in size with greater than 50% respiratory variability, suggesting right atrial pressure of 3 mmHg. IAS/Shunts: No atrial level shunt detected by color flow Doppler.  LEFT VENTRICLE PLAX 2D LVIDd:         4.40 cm  Diastology LVIDs:         3.10 cm  LV e' medial:    4.10 cm/s LV PW:         1.20 cm  LV E/e' medial:  16.0 LV IVS:        1.60 cm  LV e' lateral:   6.47 cm/s LVOT diam:     2.45 cm  LV E/e' lateral: 10.1 LV SV:         83 LV SV Index:   41 LVOT Area:     4.71 cm  RIGHT VENTRICLE             IVC  RV Basal diam:  2.20 cm     IVC diam: 1.50 cm RV S prime:     14.50 cm/s TAPSE (M-mode): 2.5 cm LEFT ATRIUM             Index       RIGHT ATRIUM           Index LA diam:        4.20 cm 2.07 cm/m  RA Area:     10.20 cm LA Vol (A2C):   48.3 ml 23.85 ml/m RA Volume:   15.20 ml  7.51 ml/m LA Vol (A4C):   37.8 ml 18.67 ml/m LA Biplane Vol: 43.2 ml 21.33 ml/m  AORTIC VALVE AV Area (Vmax):    2.31 cm AV Area (Vmean):   2.64 cm AV Area (VTI):     2.60 cm AV Vmax:           175.00 cm/s AV Vmean:          116.000 cm/s AV VTI:            0.317 m AV Peak Grad:      12.2 mmHg AV Mean Grad:      6.0 mmHg LVOT Vmax:         85.80 cm/s LVOT Vmean:        64.900 cm/s LVOT VTI:          0.175 m LVOT/AV VTI ratio: 0.55  AORTA Ao Asc diam: 2.20 cm MITRAL VALVE MV Area (PHT): 2.66 cm     SHUNTS MV Decel  Time: 285 msec     Systemic VTI:  0.18 m MV E velocity: 65.60 cm/s   Systemic Diam: 2.45 cm MV A velocity: 120.00 cm/s MV E/A ratio:  0.55 Loralie Champagne MD Electronically signed by Loralie Champagne MD Signature Date/Time: 03/28/2020/6:10:48 PM    Final      Assessment and Plan:   Near Syncope - Patient had episode of lightheadedness/dizziness on 03/26/2020 while in a [place]. He has significant anxiety and felt this was due to feeling claustrophobic. He went outside to his car and symptoms resolved. No syncope. - Echo this admission showed normal EF with no significant valvular disease.  - No concerning arrhythmias on telemetry. - Suspect this may have just been due to anxiety. He does have a history of orthostatic hypotension but BP right after this event was high. Do not think any additional work-up is needed.   Chest Pain with Known Multivessel CAD - Patient has known severe 3 vessel CAD including chronic CTO of ostial RCA on LHC in 08/2019. See full cath report above. Decision made to treat medically. He has been admitted multiple times since then for recurrent chest pain often in setting of markedly elevated  BP. He continues to have chest pain at rest when BP is high and he gets anxious. No exertional pain. -  EKG showed normal sinus rhythm with no acute ischemic changes.  - High-sensitivity troponin minimally elevated and flat at 25 >> 29. - Echo this admission showed LVEF of 60-65% with basal inferior hypokinesis and grade 1 diastolic dysfunction. - Continue aspirin and statin. - Continue antianginals: Imdur 30mg  daily, Lopressor 100mg  twice daily, and Ranexa 500mg  twice daily. Will avoid Amlodipine and increasing Imdur due to orthostatic hypotension.    Severe Aortic Stenosis s/p TAVR on 08/15/2019 - Echo this admission stable prosthetic valve. No significant regurgitation or stenosis. Mean gradient 6 mmHg.  Carotid Artery Disease - Neck CTA in 08/2019 showed 75% stenosis of right ICA and left 65-70% stenosis of left ICA. - Continue aspirin and statin. - Continue routine outpatient monitoring.     Hypertension with History of Orthostatic Hypotension - BP markedly elevated on arrival at 208/77. Has improved with increase in Lopressor; however, patient was orthostatic this morning with systolic BP dropping from 171 with supine to 141 when standing. He has been receiving gentle hydration - Continue home Imdur 30mg  daily and Losartan 100mg  daily. - Think it will be OK to continue Lopressor 100mg  twice daily.  - Recommend compression stockings to help with orthostatic hypotension. - Suspect anxiety is playing a large role in his hypertension.   Hyperlipidemia - LDL was 76 on 09/11/2019. - Continue Crestor 10mg  daily. This dose was recently increased so will need repeat lipid panel and LFTs in about 6-8 weeks.   Otherwise, per primary team: - Type 2 diabetes - Anxiety - Renal cell carcinoma  - Pulmonary nodules   Risk Assessment/Risk Scores:   HEAR Score (for undifferentiated chest pain):  HEAR Score: 6{  For questions or updates, please contact [place] Please consult  [email or web address] for contact info under    Signed, Darreld Mclean, PA-C  03/29/2020 4:58 PM  I have seen and examined the patient along with Darreld Mclean, PA-C .  I have reviewed the chart, notes and new data.  I agree with PA/NP's note.  Key new complaints: angina was not a recent complaint; orthostatic dizziness remains the big issue; has been taking Cardura, although not on his list Key  examination changes: RRR, 2/6 early peaking Ao ej murmur, no diastolic murmurs, rubs or gallops, no edema, clear lungs, normal distal pulses. Mild systolic HTN w 30 mm Hg orthostatic drop. Key new findings / data: no arrhythmia on telemetry, echo unchanged, creat mildly elevated on arrival  PLAN: No additional cardiac workup at this time. Stay well hydrated. Avoid abrupt changes in position. Compression stockings (preferably thigh high), when upright for longer periods of time. Tolerate SBP to 160 mm Hg, as long as not associated w angina. Stop doxazosin. Use newer generation alpha blocker (he has some tamsulosin 4 mg at home) for urinary difficulties.. Continue current metoprolol dose until follow up w Richardson Dopp on March 4.  Sanda Klein, MD, [place] [phone] 03/29/2020, 5:15 PM

## 2020-04-04 ENCOUNTER — Encounter: Payer: Self-pay | Admitting: Cardiovascular Disease

## 2020-04-04 NOTE — Progress Notes (Signed)
Cardiology Office Note   Date:  04/05/2020   ID:  Aragon, Gulden 1940-08-29, MRN [id number]  PCP:  Haywood Pao, MD  Cardiologist:   Mertie Moores, MD   Chief Complaint  Patient presents with  . Aortic Stenosis  . Coronary Artery Disease   Problem List  1. Premature ventricular contractions 2. Anxiety 3. Hypertension 4. Hyperlipidemia 5. 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 Bilateral carotid artery disease - moderate 7. 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 TIA 9.  Aortic stenosis:   S/p TAVR      LONALD LUMPKIN is a 80 y.o. male who presents for further evaluation of his palpitations and HTN.  He is having some muscle aches - especially in the morning.  Seems to be bilateral shoulder pain. He has held the pravachol for several weeks but the pain did not resolved. BP has been a little high. Tried taking 1 1/2 of the Losartan / HCTZ and has occasionally takes extra metoprolol.  September 20, 2014:  Alexis Griffith is doing well.  BP has been well controlled.  Tried a dose of Valsartan and got dizzy, he went back to Losartan after that and has done well.  He needs to have cataract surgery .   Jan. 30, 2017: Doing well  He did not have his cataract surgery as scheduled.    Recent labs look good Aches and pains but otherwise doing ok  Aug. 16, 2017:  Doing ok BP has been well controlled.  Having some left shoulder problems   - hurts at night. 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  No CP ,   Still has occasional elevated HTN  Sept. 25, 2018: Doing well.  Is followed by [name] for a past CVA and is on Aggrenox ( flagged the ASA dose alert )   Jul 13, 2017:   Theodric is doing well. Is not exercising  Is having some balance issues.   No CP  Still stress in his life.   PVCs seem to be well controlled.  Is taking metoprolol 50 mg TID   April 12, 2018:  No CP , no dyspnea.  Palpitations are under control   May 24, 2019: Antiono is seen today for follow-up of his hypertension, palpitations, mild aortic stenosis, hyperlipidemia, anxiety. Has rare episodes of chest pain - randomly  No exercising regularly at all.    Jul 04, 2019:  Deitrick is seen today.  He had complained of chest pain at his last office visit . 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  Ct also showd a 4 mm pulmonary nodule.  He is here to discuss cath .  The cp is on and off.   At times its related to exertion He is not exercising regularly .   Sept. 20, 2021 Alexis Griffith is s/p TAVR since I last saw him. Also recent hospitalization for sepsis, syncope   Hx of CAD, AS, HLD, DM, ? Left renal mass ( ? Cancer )   On Aug. 20 he was admitted with syncope.  Had an indwelling foley develped sepsis from urosepsis  Work up also identified pulmonary nodules.  Has an indwelling foley catheter.   Due to BPH, Also has a left renal mass.    Oct. 28, 2021: Alexis Griffith is seen for follow up of his recent TAVR. He has CAD. Has been treated medically .  he was recently admitted with cp. He ruled out for MI He was found to be hypertensive - he had skipped several of his meds that AM. Still has foley catheter.  Was at the hospital recently , was given hydralizine - has never started  Also was changed form losartan  to losartan but he also did not make that change either.  Nov. 22, 2021: Alexis Griffith is seen for recent worsening of leg edema Hx of CAD, CHF, TAVR, renal cell CA His BP has been on the low side Will DC Losartan HCT and have him take Losartan 100 mg a day goig forward Will give him a separate script for HCTZ at some point but only if he needs it   Feb. 4, 2022 Alexis Griffith is seen today for follow up of his TAVR, CHF, CAD He continues to have issues with syncope as well as HTN [facility] for near syncope.  He had been out shopping with his wife,  Got dizzy.   Thought he  was going to pass out. Walked out and felt better.   Thinks he may have just had an anxiety attack Troponin was found to be 25. , 73 Was seen by Dr. Loletha Grayer.  His metoprolol was increased to 100 mg BID He found his HR to be in the low 50 so he did not continue the high dose  Now is back on metoprolol 50 BID .  He avoids salt  His BP tends to increase in the evingings and at night  Will add amlodipine 2.5 at noon .  He will cont to measure BP     Past Medical History:  Diagnosis Date  . Anxiety   . 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 CAD (coronary artery disease)   . Carotid artery disease ([place])    CTA 08/2019 of the head and neck show moderate 65 to 75% bilateral ICA stenoses   . Chronic diastolic CHF (congestive heart failure) ([place])   . Diabetes mellitus type 2 in nonobese (HCC)   . Excessive urination at night 06/05/2013  . Giant cell arteritis ([place]) 05/17/2012  . Hyperlipidemia   . Hypertension   . Hypertensive urgency   . Hypomagnesemia 09/11/2019  . Mild anemia 09/11/2019  . NSTEMI (non-ST elevated myocardial infarction) ([place]) 09/09/2019  . Orthostatic hypotension 09/11/2019  . Premature atrial contractions 09/12/2019  . Pulmonary nodules   . PVC (premature ventricular contraction)   . Renal mass   . 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 Burt Knack & Dr. Cyndia Bent  . Severe aortic stenosis   . Sinus bradycardia on ECG   . Sleep apnea   . Squamous cell carcinoma of tongue [facility]) September 2012   Followed by Dr. Wilburn Cornelia.  . Stroke ([place])   . TIA (transient ischemic attack)   . Urinary retention 09/11/2019    Past Surgical History:  Procedure Laterality Date  . CATARACT EXTRACTION, BILATERAL  10/2015, and 11/2015   with adding  TORIC lenses bilaterally   . INTRAVASCULAR PRESSURE WIRE/FFR STUDY N/A 07/07/2019   Procedure: INTRAVASCULAR PRESSURE WIRE/FFR STUDY;  Surgeon: [name], MD;  Location: [facility];  Service: Cardiovascular;  Laterality: N/A;  . LEFT HEART CATH AND CORONARY ANGIOGRAPHY N/A 07/07/2019   Procedure: LEFT HEART CATH AND CORONARY ANGIOGRAPHY;  Surgeon: [name], MD;  Location: [facility];  Service: Cardiovascular;  Laterality: N/A;  . SQUAMOUS CELL CARCINOMA EXCISION     tongue  . TEE WITHOUT CARDIOVERSION N/A 08/15/2019   Procedure: TRANSESOPHAGEAL ECHOCARDIOGRAM (TEE);  Surgeon: Sherren Mocha, MD;  Location: [facility];  Service: Open Heart Surgery;  Laterality: N/A;  . TRANSCATHETER AORTIC VALVE REPLACEMENT, TRANSFEMORAL N/A 08/15/2019   Procedure: TRANSCATHETER AORTIC VALVE REPLACEMENT, TRANSFEMORAL using Edwards [employer] SAPIEN 3 Ultra 26 mm THV.;  Surgeon: Sherren Mocha, MD;  Location: [facility];  Service: Open Heart Surgery;  Laterality: N/A;     Current Outpatient Medications  Medication Sig Dispense Refill  . acetaminophen (TYLENOL) 325 MG tablet Take 650 mg by mouth every 6 (six) hours as needed for mild pain.    Marland Kitchen amLODipine (NORVASC) 2.5 MG tablet Take 1 tablet (2.5 mg total) by mouth daily. 180 tablet 3  . aspirin 81 MG chewable tablet Chew 1 tablet (81 mg total) by mouth daily. 360 tablet 0  . clonazePAM (KLONOPIN) 0.5 MG tablet Take 0.5 mg by mouth daily.    . FENOFIBRATE PO Take 135 mg by mouth daily.     Marland Kitchen glucose blood test strip 1 each by Other route as needed.     . insulin glargine (LANTUS) 100 UNIT/ML injection Inject 60 Units into the skin daily.    . isosorbide mononitrate (IMDUR) 30 MG 24 hr tablet Take 1 tablet (30 mg total) by mouth daily. 90 tablet 3  . Lancets (ONETOUCH DELICA PLUS [id number]) MISC 1 each by Other route daily.     Marland Kitchen losartan (COZAAR) 100 MG tablet Take 1 tablet (100 mg total) by mouth daily. 90 tablet 3  . magnesium oxide (MAG-OX) 400 (241.3 Mg) MG tablet Take 1 tablet (400 mg total) by mouth daily. 30 tablet 3  . magnesium oxide (MAG-OX) 400 MG tablet Take 400 mg by mouth daily.    . metoprolol  tartrate (LOPRESSOR) 50 MG tablet Take 1 tablet (50 mg total) by mouth 2 (two) times daily. 180 tablet 3  . nitroGLYCERIN (NITROSTAT) 0.4 MG SL tablet Place 1 tablet (0.4 mg total) under the tongue every 5 (five) minutes as needed for chest pain (up to 3 doses). Do not give if blood pressure is low (less than 409 systolic). 25 tablet 7  . ONE TOUCH ULTRA TEST test strip 1 each by Other route daily.     . ranolazine (RANEXA) 500 MG 12 hr tablet TAKE 1 TABLET BY MOUTH TWICE DAILY. (Patient taking differently: Take 500 mg by mouth 2 (two) times daily.) 180 tablet 3  . rosuvastatin (CRESTOR) 10 MG tablet Take 1 tablet (10 mg total) by mouth daily. 90 tablet 3  . sitaGLIPtin (JANUVIA) 100 MG tablet Take 100 mg by mouth daily.     No current facility-administered medications for this visit.    Allergies:   Macrodantin [nitrofurantoin], Tape, Sulfa antibiotics, Lisinopril, Nutritional supplements, and Penicillins    Social History:  The patient  reports that he quit smoking about 44 years ago. He has never used smokeless tobacco. He reports that he does not drink alcohol and does not use drugs.   Family History:  The patient's family history includes Heart attack in his father and mother; Heart disease in his father and mother.    ROS:   Noted in current history, otherwise review of systems is negative.   Physical Exam: Blood pressure (!) 122/54, pulse (!) 58, height 5\' 10"  (1.778 m), weight 185 lb 12.8 oz (84.3 kg), SpO2 97 %.  GEN:  Well nourished,  well developed in no acute distress HEENT: Normal soft left carotid bruit LYMPHATICS: No lymphadenopathy CARDIAC: RRR , soft systolic murmur  RESPIRATORY:  Clear to auscultation without rales, wheezing or rhonchi  ABDOMEN: Soft, non-tender, non-distended MUSCULOSKELETAL:  No edema; No deformity  SKIN: Warm and dry NEUROLOGIC:  Alert and oriented x 3   EKG:     Recent Labs: 09/09/2019: B Natriuretic Peptide 326.2 10/01/2019: TSH  1.472 10/21/2019: ALT 15 03/28/2020: Magnesium 1.8 03/29/2020: BUN 19; Creatinine, Ser 1.02; Hemoglobin 14.9; Platelets 149; Potassium 3.8; Sodium 138    Lipid Panel    Component Value Date/Time   CHOL 130 09/09/2019 0308   CHOL 139 06/14/2019 0959   TRIG 68 09/09/2019 0308   HDL 40 (L) 09/09/2019 0308   HDL 35 (L) 06/14/2019 0959   CHOLHDL 3.3 09/09/2019 0308   VLDL 14 09/09/2019 0308   LDLCALC 76 09/09/2019 0308   LDLCALC 80 06/14/2019 0959      Wt Readings from Last 3 Encounters:  04/05/20 185 lb 12.8 oz (84.3 kg)  03/29/20 180 lb 9.6 oz (81.9 kg)  02/29/20 186 lb 4 oz (84.5 kg)      Other studies Reviewed: Additional studies/ records that were reviewed today include: . Review of the above records demonstrates:    ASSESSMENT AND PLAN:  1.  Coronary artery disease:    Has known CAD.   No real angina at this point . Had an episode of near syncope several weeks ago.  He was admitted to the hospital and had minimal troponin elevations.  He was treated medically and has done well since that time.  Their recommendation was to increase his metoprolol to 100 mg twice a day but he did not do this because he felt like his heart rate was already slow enough.  Agreed that his heart rate is stable on metoprolol 50 mg twice a day and will continue with that dose.  2.  Aortic stenosis-  S/p TAVR , doing well   3. Hypertension -blood pressures fairly well controlled the morning but tends to go up in the afternoon and evening.  We will try having him add amlodipine 2.5 mg a day to be taken at lunchtime.  He will continue to measure his blood pressure.  He will follow up with Richardson Dopp on March 8.   4. Hyperlipidemia -     5. 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 Bilateral carotid artery disease -      Current medicines are reviewed at length with the patient today.  The patient does not have concerns regarding medicines.  The following  changes have been made:  no change   Disposition:    Keep appt with Richardson Dopp, PA on May 07, 2020    Gentry Roch, MD  04/05/2020 1:17 PM    [facility] [address], [place], [place]  [address] Phone: [phone]; Fax: [phone]

## 2020-04-05 ENCOUNTER — Ambulatory Visit: Payer: Medicare Other | Admitting: Cardiovascular Disease

## 2020-04-05 ENCOUNTER — Encounter: Payer: Self-pay | Admitting: Cardiovascular Disease

## 2020-04-05 ENCOUNTER — Other Ambulatory Visit: Payer: Self-pay

## 2020-04-05 VITALS — BP 122/54 | HR 58 | Ht 70.0 in | Wt 185.8 lb

## 2020-04-05 DIAGNOSIS — I25118 Atherosclerotic heart disease of native coronary artery with other forms of angina pectoris: Secondary | ICD-10-CM | POA: Diagnosis not present

## 2020-04-05 DIAGNOSIS — R0989 Other specified symptoms and signs involving the circulatory and respiratory systems: Secondary | ICD-10-CM | POA: Diagnosis not present

## 2020-04-05 DIAGNOSIS — I6523 Occlusion and stenosis of bilateral carotid arteries: Secondary | ICD-10-CM

## 2020-04-05 DIAGNOSIS — Z952 Presence of prosthetic heart valve: Secondary | ICD-10-CM | POA: Diagnosis not present

## 2020-04-05 MED ORDER — METOPROLOL TARTRATE 50 MG PO TABS: 50.0000 mg | ORAL_TABLET | Freq: Two times a day (BID) | ORAL | 3 refills | Status: DC

## 2020-04-05 MED ORDER — AMLODIPINE BESYLATE 2.5 MG PO TABS: 2.5000 mg | ORAL_TABLET | Freq: Every day | ORAL | 3 refills | Status: DC

## 2020-04-05 NOTE — Patient Instructions (Signed)
Medication Instructions:  Your physician has recommended you make the following change in your medication:   DECREASE metoprolol to 50mg  twice a day. START Amlodipine 2.5mg  daily  *If you need a refill on your cardiac medications before your next appointment, please call your pharmacy*   Lab Work: none If you have labs (blood work) drawn today and your tests are completely normal, you will receive your results only by: Marland Kitchen MyChart Message (if you have MyChart) OR . A paper copy in the mail If you have any lab test that is abnormal or we need to change your treatment, we will call you to review the results.   Testing/Procedures: none   Follow-Up: At [facility], you and your health needs are our priority.  As part of our continuing mission to provide you with exceptional heart care, we have created designated Provider Care Teams.  These Care Teams include your primary Cardiologist (physician) and Advanced Practice Providers (APPs -  Physician Assistants and Nurse Practitioners) who all work together to provide you with the care you need, when you need it.  We recommend signing up for the patient portal called "MyChart".  Sign up information is provided on this After Visit Summary.  MyChart is used to connect with patients for Virtual Visits (Telemedicine).  Patients are able to view lab/test results, encounter notes, upcoming appointments, etc.  Non-urgent messages can be sent to your provider as well.   To learn more about what you can do with MyChart, go to [email or web address].    Your next appointment:   05/07/2020  The format for your next appointment:   In Person  Provider:    Richardson Dopp, PA-C

## 2020-04-10 ENCOUNTER — Telehealth: Payer: Self-pay | Admitting: Cardiovascular Disease

## 2020-04-10 NOTE — Telephone Encounter (Signed)
Rerouting to CMA pool.

## 2020-04-10 NOTE — Telephone Encounter (Signed)
RN called the patient to follow up on his b/p after taking his medication and his b/p had improved to 156/65, told patient to continue to take medications as prescribed and write down his b/p throughout the day and if his b/p elevates again, to call the office, Pt stated he understood the plan and didn't have any other questions.

## 2020-04-10 NOTE — Telephone Encounter (Signed)
This message needs to go to triage. Please send to correct department. Thanks

## 2020-04-10 NOTE — Telephone Encounter (Signed)
RN called pt in regards to medication issue of dizziness and asking if it was possible that his Imdur was causing his dizziness. RN recommended the pt to make sure he was drinking plenty of water/fulids, and to be aware of position changes and to do them slowly in order to decrease possible dizziness.   Pt stated that he has an appt 05/07/2020; and he will discuss these issue at his appt. RN stated that if his symptoms got worse for him to call EMS. Pt stated he was ok with these recommendations and had no further questions or concerns.

## 2020-04-10 NOTE — Telephone Encounter (Signed)
RN called pt again to ask patient to take his b/p; he stated that he took it 5 minutes after our call and it was 195/88 , patient stated he had just taken his amlodipine 2.5 mg po daily, andpt retook it while on the phone and it was  200/91; RN stated that he could be dizzy d/t his b/p being this high. RN requested the patient to sit and retake his b/p around 3pm and RN would f/u with him. Pt stated he understood and appreciated RN plan.

## 2020-04-10 NOTE — Telephone Encounter (Signed)
Pt c/o medication issue:  1. Name of Medication: isosorbide mononitrate (IMDUR) 30 MG 24 hr tablet  2. How are you currently taking this medication (dosage and times per day)?  1 tablet (30 mg total) by mouth daily  3. Are you having a reaction (difficulty breathing--STAT)?  No   4. What is your medication issue?   Patient states he found out that this medication causes dizziness. He states he has been taking it for months, but he recently went to the hospital for dizziness. Patient now worries this medication may have been the cause of the dizziness. Please return call to discuss.

## 2020-04-18 ENCOUNTER — Telehealth: Payer: Self-pay | Admitting: Cardiovascular Disease

## 2020-04-18 NOTE — Telephone Encounter (Signed)
Pt c/o of Chest Pain: STAT if CP now or developed within 24 hours  1. Are you having CP right now? Yes, not pain discomfort   2. Are you experiencing any other symptoms (ex. SOB, nausea, vomiting, sweating)? Lightheaded sometimes   3. How long have you been experiencing CP? A long time   4. Is your CP continuous or coming and going? Coming and going   5. Have you taken Nitroglycerin? Yes, doesn't feel it's working ?  Requesting to be worked in for an appt today to have an EKG performed. Please advise.

## 2020-04-18 NOTE — Telephone Encounter (Signed)
Pt called to report that he has been having chest discomfort for several weeks and he has not been getting any relief... he is asking to see Dr. Acie Fredrickson today but I advised him that Dr. Acie Fredrickson is out of the office..I made him an appt with Dr. Acie Fredrickson for tomorrow and if anything changes or worsens he will call back or go to the ED.

## 2020-04-19 ENCOUNTER — Other Ambulatory Visit: Payer: Self-pay

## 2020-04-19 ENCOUNTER — Encounter: Payer: Self-pay | Admitting: Cardiovascular Disease

## 2020-04-19 ENCOUNTER — Ambulatory Visit: Payer: Medicare Other | Admitting: Cardiovascular Disease

## 2020-04-19 VITALS — BP 164/68 | HR 62 | Ht 70.0 in | Wt 186.4 lb

## 2020-04-19 DIAGNOSIS — I25118 Atherosclerotic heart disease of native coronary artery with other forms of angina pectoris: Secondary | ICD-10-CM

## 2020-04-19 MED ORDER — AMLODIPINE BESYLATE 5 MG PO TABS: 5.0000 mg | ORAL_TABLET | Freq: Every day | ORAL | 3 refills | Status: DC

## 2020-04-19 NOTE — Patient Instructions (Signed)
Medication Instructions:  Your physician has recommended you make the following change in your medication:   INCREASE amlodipine to 5mg  daily.   *If you need a refill on your cardiac medications before your next appointment, please call your pharmacy*   Lab Work: none If you have labs (blood work) drawn today and your tests are completely normal, you will receive your results only by: Marland Kitchen MyChart Message (if you have MyChart) OR . A paper copy in the mail If you have any lab test that is abnormal or we need to change your treatment, we will call you to review the results.   Testing/Procedures: none   Follow-Up: At [facility], you and your health needs are our priority.  As part of our continuing mission to provide you with exceptional heart care, we have created designated Provider Care Teams.  These Care Teams include your primary Cardiologist (physician) and Advanced Practice Providers (APPs -  Physician Assistants and Nurse Practitioners) who all work together to provide you with the care you need, when you need it.  We recommend signing up for the patient portal called "MyChart".  Sign up information is provided on this After Visit Summary.  MyChart is used to connect with patients for Virtual Visits (Telemedicine).  Patients are able to view lab/test results, encounter notes, upcoming appointments, etc.  Non-urgent messages can be sent to your provider as well.   To learn more about what you can do with MyChart, go to [email or web address].    Your next appointment:   3 month(s)  The format for your next appointment:   In Person  Provider:   You will see one of the following Advanced Practice Providers on your designated Care Team:    Richardson Dopp, [place]

## 2020-04-19 NOTE — Progress Notes (Signed)
Cardiology Office Note   Date:  04/19/2020   ID:  Surya, Schroeter 03/14/1940, MRN [id number]  PCP:  Haywood Pao, MD  Cardiologist:   Mertie Moores, MD   Chief Complaint  Patient presents with  . Coronary Artery Disease   Problem List  1. Premature ventricular contractions 2. Anxiety 3. Hypertension 4. Hyperlipidemia 5. 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 Bilateral carotid artery disease - moderate 7. 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 TIA 9.  Aortic stenosis:   S/p TAVR      RASMUS PREUSSER is a 80 y.o. male who presents for further evaluation of his palpitations and HTN.  He is having some muscle aches - especially in the morning.  Seems to be bilateral shoulder pain. He has held the pravachol for several weeks but the pain did not resolved. BP has been a little high. Tried taking 1 1/2 of the Losartan / HCTZ and has occasionally takes extra metoprolol.  September 20, 2014:  [name] is doing well.  BP has been well controlled.  Tried a dose of Valsartan and got dizzy, he went back to Losartan after that and has done well.  He needs to have cataract surgery .   Jan. 30, 2017: Doing well  He did not have his cataract surgery as scheduled.    Recent labs look good Aches and pains but otherwise doing ok  Aug. 16, 2017:  Doing ok BP has been well controlled.  Having some left shoulder problems   - hurts at night. 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  No CP ,   Still has occasional elevated HTN  Sept. 25, 2018: Doing well.  Is followed by [name] for a past CVA and is on Aggrenox ( flagged the ASA dose alert )   Jul 13, 2017:   Thorin is doing well. Is not exercising  Is having some balance issues.   No CP  Still stress in his life.   PVCs seem to be well controlled.  Is taking metoprolol 50 mg TID   April 12, 2018:  No CP , no  dyspnea.  Palpitations are under control   May 24, 2019: Franz is seen today for follow-up of his hypertension, palpitations, mild aortic stenosis, hyperlipidemia, anxiety. Has rare episodes of chest pain - randomly  No exercising regularly at all.    Jul 04, 2019:  Jocob is seen today.  He had complained of chest pain at his last office visit . 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  Ct also showd a 4 mm pulmonary nodule.  He is here to discuss cath .  The cp is on and off.   At times its related to exertion He is not exercising regularly .   Sept. 20, 2021 [name] is s/p TAVR since I last saw him. Also recent hospitalization for sepsis, syncope   Hx of CAD, AS, HLD, DM, ? Left renal mass ( ? Cancer )   On Aug. 20 he was admitted with syncope.  Had an indwelling foley develped sepsis from urosepsis  Work up also identified pulmonary nodules.  Has an indwelling foley catheter.   Due to BPH, Also has a left renal mass.    Oct. 28, 2021: [name] is seen for follow up of his recent TAVR. He has CAD. Has been treated medically .   he was  recently admitted with cp. He ruled out for MI He was found to be hypertensive - he had skipped several of his meds that AM. Still has foley catheter.  Was at the hospital recently , was given hydralizine - has never started  Also was changed form losartan  to losartan but he also did not make that change either.  Nov. 22, 2021: [name] is seen for recent worsening of leg edema Hx of CAD, CHF, TAVR, renal cell CA His BP has been on the low side Will DC Losartan HCT and have him take Losartan 100 mg a day goig forward Will give him a separate script for HCTZ at some point but only if he needs it   Feb. 4, 2022 [name] is seen today for follow up of his TAVR, CHF, CAD He continues to have issues with syncope as well as HTN Was [facility] Jan[facility] for near syncope.  He had been out shopping with his wife,  Got dizzy.   Thought he was going to pass  out. Walked out and felt better.   Thinks he may have just had an anxiety attack Troponin was found to be 25. , 16 Was seen by Dr. Loletha Grayer.  His metoprolol was increased to 100 mg BID He found his HR to be in the low 50 so he did not continue the high dose  Now is back on metoprolol 50 BID .  He avoids salt  His BP tends to increase in the evingings and at night  Will add amlodipine 2.5 at noon .  He will cont to measure BP   Feb. 18, 2022:  [name] is seen today for some increasing cp. He has known coronary artery disease.  He also has had some hypertension recently.  We started him on amlodipine 2.5 mg at his last office visit. Has been given a script for Lexipro  Still has CP,  NTG  Does not help it Radiates to his left scapula .    Dull achy , constant pain .   All day long  His pain is not related to exercise, eating or drinking or change of position.   Past Medical History:  Diagnosis Date  . Anxiety   . 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 CAD (coronary artery disease)   . Carotid artery disease ([place])    CTA 08/2019 of the head and neck show moderate 65 to 75% bilateral ICA stenoses   . Chronic diastolic CHF (congestive heart failure) ([place])   . Diabetes mellitus type 2 in nonobese (HCC)   . Excessive urination at night 06/05/2013  . Giant cell arteritis ([place]) 05/17/2012  . Hyperlipidemia   . Hypertension   . Hypertensive urgency   . Hypomagnesemia 09/11/2019  . Mild anemia 09/11/2019  . NSTEMI (non-ST elevated myocardial infarction) ([place]) 09/09/2019  . Orthostatic hypotension 09/11/2019  . Premature atrial contractions 09/12/2019  . Pulmonary nodules   . PVC (premature ventricular contraction)   . Renal mass   . 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 Burt Knack & Dr. Cyndia Bent  . Severe aortic stenosis   . Sinus bradycardia on ECG   . Sleep apnea   . Squamous cell carcinoma of tongue [facility]) September 2012    Followed by Dr. Wilburn Cornelia.  . Stroke ([place])   . TIA (transient ischemic attack)   . Urinary retention 09/11/2019    Past Surgical History:  Procedure Laterality Date  . CATARACT EXTRACTION, BILATERAL  10/2015, and 11/2015   with adding  TORIC lenses bilaterally   . INTRAVASCULAR PRESSURE WIRE/FFR STUDY N/A 07/07/2019   Procedure: INTRAVASCULAR PRESSURE WIRE/FFR STUDY;  Surgeon: [name], MD;  Location: [place] [facility];  Service: Cardiovascular;  Laterality: N/A;  . LEFT HEART CATH AND CORONARY ANGIOGRAPHY N/A 07/07/2019   Procedure: LEFT HEART CATH AND CORONARY ANGIOGRAPHY;  Surgeon: [name], MD;  Location: [place] [facility];  Service: Cardiovascular;  Laterality: N/A;  . SQUAMOUS CELL CARCINOMA EXCISION     tongue  . TEE WITHOUT CARDIOVERSION N/A 08/15/2019   Procedure: TRANSESOPHAGEAL ECHOCARDIOGRAM (TEE);  Surgeon: Sherren Mocha, MD;  Location: [place];  Service: Open Heart Surgery;  Laterality: N/A;  . TRANSCATHETER AORTIC VALVE REPLACEMENT, TRANSFEMORAL N/A 08/15/2019   Procedure: TRANSCATHETER AORTIC VALVE REPLACEMENT, TRANSFEMORAL using Edwards [employer] SAPIEN 3 Ultra 26 mm THV.;  Surgeon: Sherren Mocha, MD;  Location: [facility];  Service: Open Heart Surgery;  Laterality: N/A;     Current Outpatient Medications  Medication Sig Dispense Refill  . acetaminophen (TYLENOL) 325 MG tablet Take 650 mg by mouth every 6 (six) hours as needed for mild pain.    Marland Kitchen amLODipine (NORVASC) 2.5 MG tablet Take 1 tablet (2.5 mg total) by mouth daily. 180 tablet 3  . aspirin 81 MG chewable tablet Chew 1 tablet (81 mg total) by mouth daily. 360 tablet 0  . Choline Fenofibrate (FENOFIBRIC ACID) 135 MG CPDR Take 1 capsule by mouth daily.    . clonazePAM (KLONOPIN) 0.5 MG tablet Take 0.5 mg by mouth daily.    . FENOFIBRATE PO Take 135 mg by mouth daily.     Marland Kitchen glucose blood test strip 1 each by Other route as needed.     . insulin glargine (LANTUS) 100 UNIT/ML injection Inject 60 Units into  the skin daily.    . isosorbide mononitrate (IMDUR) 30 MG 24 hr tablet Take 1 tablet (30 mg total) by mouth daily. 90 tablet 3  . Lancets (ONETOUCH DELICA PLUS [id number]) MISC 1 each by Other route daily.     Marland Kitchen losartan (COZAAR) 100 MG tablet Take 1 tablet (100 mg total) by mouth daily. 90 tablet 3  . magnesium oxide (MAG-OX) 400 (241.3 Mg) MG tablet Take 1 tablet (400 mg total) by mouth daily. 30 tablet 3  . magnesium oxide (MAG-OX) 400 MG tablet Take 400 mg by mouth daily.    . metoprolol tartrate (LOPRESSOR) 50 MG tablet Take 1 tablet (50 mg total) by mouth 2 (two) times daily. 180 tablet 3  . nitroGLYCERIN (NITROSTAT) 0.4 MG SL tablet Place 1 tablet (0.4 mg total) under the tongue every 5 (five) minutes as needed for chest pain (up to 3 doses). Do not give if blood pressure is low (less than 154 systolic). 25 tablet 7  . ONE TOUCH ULTRA TEST test strip 1 each by Other route daily.     . ranolazine (RANEXA) 500 MG 12 hr tablet TAKE 1 TABLET BY MOUTH TWICE DAILY. 180 tablet 3  . rosuvastatin (CRESTOR) 10 MG tablet Take 1 tablet (10 mg total) by mouth daily. 90 tablet 3  . sitaGLIPtin (JANUVIA) 100 MG tablet Take 100 mg by mouth daily.     No current facility-administered medications for this visit.    Allergies:   Macrodantin [nitrofurantoin], Tape, Sulfa antibiotics, Lisinopril, Nutritional supplements, and Penicillins    Social History:  The patient  reports that he quit smoking  about 44 years ago. He has never used smokeless tobacco. He reports that he does not drink alcohol and does not use drugs.   Family History:  The patient's family history includes Heart attack in his father and mother; Heart disease in his father and mother.    ROS:   Noted in current history, otherwise review of systems is negative.   Physical Exam: Blood pressure (!) 164/68, pulse 62, height 5\' 10"  (1.778 m), weight 186 lb 6.4 oz (84.6 kg), SpO2 95 %.  GEN:  Well nourished, well developed in no acute  distress HEENT: Normal NECK: No JVD; No carotid bruits LYMPHATICS: No lymphadenopathy CARDIAC: RRR , soft systolic murmur. RESPIRATORY:  Clear to auscultation without rales, wheezing or rhonchi  ABDOMEN: Soft, non-tender, non-distended MUSCULOSKELETAL:  No edema; No deformity  SKIN: Warm and dry NEUROLOGIC:  Alert and oriented x 3   EKG:   April 19, 2020: Normal sinus rhythm.  Nonspecific ST and T wave changes.  Recent Labs: 09/09/2019: B Natriuretic Peptide 326.2 10/01/2019: TSH 1.472 10/21/2019: ALT 15 03/28/2020: Magnesium 1.8 03/29/2020: BUN 19; Creatinine, Ser 1.02; Hemoglobin 14.9; Platelets 149; Potassium 3.8; Sodium 138    Lipid Panel    Component Value Date/Time   CHOL 130 09/09/2019 0308   CHOL 139 06/14/2019 0959   TRIG 68 09/09/2019 0308   HDL 40 (L) 09/09/2019 0308   HDL 35 (L) 06/14/2019 0959   CHOLHDL 3.3 09/09/2019 0308   VLDL 14 09/09/2019 0308   LDLCALC 76 09/09/2019 0308   LDLCALC 80 06/14/2019 0959      Wt Readings from Last 3 Encounters:  04/19/20 186 lb 6.4 oz (84.6 kg)  04/05/20 185 lb 12.8 oz (84.3 kg)  03/29/20 180 lb 9.6 oz (81.9 kg)      Other studies Reviewed: Additional studies/ records that were reviewed today include: . Review of the above records demonstrates:    ASSESSMENT AND PLAN:  1.  Coronary artery disease:    He has constant CP .   This does not sound anginal to me.  He is not having any exertional symptoms.  He has known occlusion of his right coronary artery and has collateral filling from his left side.  The right coronary artery cannot be opened.  At this point I do not think that his there is any evidence that his left-sided vessels have been compromised.  I have asked him to continue to watch for any worsening symptoms.  I suspect a lot of this is due to anxiety.   2.  Aortic stenosis-  S/p TAVR , doing well   3. Hypertension -  Increase amoldipine to 5 mg a day    4. Hyperlipidemia -     5. Squamous cell  carcinoma of tongue - s/p resection [name], MD)  6. Bilateral carotid artery disease -      Current medicines are reviewed at length with the patient today.  The patient does not have concerns regarding medicines.  The following changes have been made:  no change   Disposition:         Signed, Mertie Moores, MD  04/19/2020 3:58 PM    [address], [place], [place]  [address] Phone: [phone]; Fax: [phone]

## 2020-05-03 ENCOUNTER — Ambulatory Visit: Payer: Medicare Other | Admitting: Physician Assistant

## 2020-05-06 NOTE — Progress Notes (Signed)
Cardiology Office Note:    Date:  05/07/2020   ID:  Alexis Griffith, DOB Feb 07, 1941, MRN [id number]  PCP:  Haywood Pao, MD   [place]  Cardiologist:  Mertie Moores, MD  Electrophysiologist:  Virl Axe, MD       Referring MD: Haywood Pao, MD   Chief Complaint:  Follow-up (CAD)    Patient Profile:    Alexis Griffith is a 80 y.o. male with:   Coronary artery disease w/ angina  Cath 5/21: RCA occluded; p-mLAD 70 and p-mLCx 75 >> med Rx   NSTEMI 08/2019 >> managed medically; unable to take DAPT due to risk for gross hematuria (Clopidogrel DC'd post TAVR in setting of gross hematuria)  Aortic stenosis s/p TAVR in 6/21  (HFpEF) heart failure with preserved ejection fraction   Hypertension   Hyperlipidemia   Diabetes mellitus, on insulin   OSA  Hx of CVA   Pulmonary nodules   Giant cell arteritis 08/2011  Renal Cell CA  PVCs   Squamous cell CA of the tongue s/p resection   Hx of syncope   Orthostatic hypotension   Prior CV studies: Echocardiogram 03/28/20 EF 60-65, inf HK, mod LVH, Gr 1 DD, normal RVSF, TAVR functioning normally (mean 6 mmHg, no PVL)  Carotid US 08/17/19 R 40-59; L 1-39  Cardiac catheterization 07/07/19  Ost LM lesion is 30% stenosed.  Prox LAD to Mid LAD lesion is 70% stenosed with 70% stenosed side branch in 2nd Diag. Both vessels positive by CT FFR  Mid LAD to Dist LAD lesion is 40% stenosed. Dist LAD lesion is 75% stenosed.  Prox Cx lesion is 45% stenosed. Prox Cx to Mid Cx lesion is 75% stenosed. RFR positive is 0.87  Mid Cx lesion is 5% stenosed with 50% stenosed side branch in 3rd Mrg. No significant drop in RFR reading beyond this lesion.  Ost RCA to Dist RCA lesion is 100% stenosed. Significant left to right collaterals fill the PDA and PL system.  RPDA lesion is 50% stenosed.  EF 55-65      History of Present Illness:    Alexis Griffith was last seen by Dr. Acie Fredrickson in 04/2020.  The  patient continued to have symptoms of chest pain.  His BP was elevated and his Amlodipine dose was increased.  He returns for follow-up.  He is here today with his wife.  He continues to have episodes of chest discomfort.  This sometimes seems to be musculoskeletal.  He sometimes takes nitroglycerin with relief and sometimes has no improvement.  Overall, his chest symptoms are stable.  He has not had significant shortness of breath.  He has not had syncope, orthopnea or leg edema.  He has significant issues with anxiety.  This often occurs at night and he has elevated blood pressures with this.  He takes his blood pressure medicines at different times the day to avoid hypotension.      Past Medical History:  Diagnosis Date  . Anxiety   . 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 CAD (coronary artery disease)   . Carotid artery disease ([place])    CTA 08/2019 of the head and neck show moderate 65 to 75% bilateral ICA stenoses   . Chronic diastolic CHF (congestive heart failure) ([place])   . Diabetes mellitus type 2 in nonobese (HCC)   . Excessive urination at night 06/05/2013  . Giant cell arteritis ([place]) 05/17/2012  . Hyperlipidemia   . Hypertension   .  Hypertensive urgency   . Hypomagnesemia 09/11/2019  . Mild anemia 09/11/2019  . NSTEMI (non-ST elevated myocardial infarction) ([place]) 09/09/2019  . Orthostatic hypotension 09/11/2019  . Premature atrial contractions 09/12/2019  . Pulmonary nodules   . PVC (premature ventricular contraction)   . Renal mass   . 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 Burt Knack & Dr. Cyndia Bent  . Severe aortic stenosis   . Sinus bradycardia on ECG   . Sleep apnea   . Squamous cell carcinoma of tongue [facility]) September 2012   Followed by Dr. Wilburn Cornelia.  . Stroke ([place])   . TIA (transient ischemic attack)   . Urinary retention 09/11/2019    Current Medications: Current Meds  Medication Sig  .  acetaminophen (TYLENOL) 325 MG tablet Take 650 mg by mouth every 6 (six) hours as needed for mild pain.  Marland Kitchen amLODipine (NORVASC) 5 MG tablet Take 1 tablet (5 mg total) by mouth daily.  Marland Kitchen aspirin 81 MG chewable tablet Chew 1 tablet (81 mg total) by mouth daily.  . clonazePAM (KLONOPIN) 0.5 MG tablet Take 0.5 mg by mouth daily.  Marland Kitchen escitalopram (LEXAPRO) 10 MG tablet Take 10 mg by mouth daily.  . FENOFIBRATE PO Take 135 mg by mouth daily.   Marland Kitchen glucose blood test strip 1 each by Other route as needed.   . insulin glargine (LANTUS) 100 UNIT/ML injection Inject 60 Units into the skin daily.  . Lancets (ONETOUCH DELICA PLUS [id number]) MISC 1 each by Other route daily.   Marland Kitchen losartan (COZAAR) 100 MG tablet Take 1 tablet (100 mg total) by mouth daily.  . magnesium oxide (MAG-OX) 400 MG tablet Take 400 mg by mouth daily.  . metoprolol tartrate (LOPRESSOR) 50 MG tablet Take 1 tablet (50 mg total) by mouth 2 (two) times daily.  . nitroGLYCERIN (NITROSTAT) 0.4 MG SL tablet Place 1 tablet (0.4 mg total) under the tongue every 5 (five) minutes as needed for chest pain (up to 3 doses). Do not give if blood pressure is low (less than 476 systolic).  . ONE TOUCH ULTRA TEST test strip 1 each by Other route daily.   . ranolazine (RANEXA) 500 MG 12 hr tablet TAKE 1 TABLET BY MOUTH TWICE DAILY.  . rosuvastatin (CRESTOR) 10 MG tablet Take 1 tablet (10 mg total) by mouth daily.  . sitaGLIPtin (JANUVIA) 100 MG tablet Take 100 mg by mouth daily.  . [DISCONTINUED] isosorbide mononitrate (IMDUR) 30 MG 24 hr tablet Take 1 tablet (30 mg total) by mouth daily.     Allergies:   Macrodantin [nitrofurantoin], Tape, Sulfa antibiotics, Lisinopril, Nutritional supplements, and Penicillins   Social History   Tobacco Use  . Smoking status: Former Smoker    Quit date: 06/25/1975    Years since quitting: 44.8  . Smokeless tobacco: Never Used  Vaping Use  . Vaping Use: Never used  Substance Use Topics  . Alcohol use: No  . Drug  use: No     Family Hx: The patient's family history includes Heart attack in his father and mother; Heart disease in his father and mother.  ROS   EKGs/Labs/Other Test Reviewed:    EKG:  EKG is n ordered today.  The ekg ordered today demonstrates n/a  Recent Labs: 09/09/2019: B Natriuretic Peptide 326.2 10/01/2019: TSH 1.472 10/21/2019: ALT 15 03/28/2020: Magnesium 1.8 03/29/2020: BUN 19; Creatinine, Ser 1.02; Hemoglobin 14.9; Platelets 149; Potassium 3.8; Sodium 138  Recent Lipid Panel Lab Results  Component Value Date/Time   CHOL 130 09/09/2019 03:08 AM   CHOL 139 06/14/2019 09:59 AM   TRIG 68 09/09/2019 03:08 AM   HDL 40 (L) 09/09/2019 03:08 AM   HDL 35 (L) 06/14/2019 09:59 AM   CHOLHDL 3.3 09/09/2019 03:08 AM   LDLCALC 76 09/09/2019 03:08 AM   LDLCALC 80 06/14/2019 09:59 AM      Risk Assessment/Calculations:      Physical Exam:    VS:  BP (!) 140/50   Pulse (!) 53   Ht 5\' 10"  (1.778 m)   Wt 189 lb 6.4 oz (85.9 kg)   SpO2 98%   BMI 27.18 kg/m     Wt Readings from Last 3 Encounters:  05/07/20 189 lb 6.4 oz (85.9 kg)  04/19/20 186 lb 6.4 oz (84.6 kg)  04/05/20 185 lb 12.8 oz (84.3 kg)     Constitutional:      Appearance: Healthy appearance. Not in distress.  Neck:     Vascular: No JVR. JVD normal.  Pulmonary:     Effort: Pulmonary effort is normal.     Breath sounds: No wheezing. No rales.  Cardiovascular:     Normal rate. Regular rhythm. Normal S1. Normal S2.     Murmurs: There is a grade 1/6 early systolic murmur at the URSB.  Edema:    Peripheral edema absent.  Abdominal:     Palpations: Abdomen is soft.  Skin:    General: Skin is warm and dry.  Neurological:     General: No focal deficit present.     Mental Status: Alert and oriented to person, place and time.     Cranial Nerves: Cranial nerves are intact.      ASSESSMENT & PLAN:    1. Coronary artery disease of native artery of native heart with stable angina pectoris ([place]) He has a  chronically occluded RCA and moderate to severe disease in the LAD and LCx which is managed medically.  He had a non-STEMI in 7/21 that was managed medically.  He is unable to tolerate dual antiplatelet therapy due to history of gross hematuria.  This occurred while on clopidogrel after his TAVR.  Overall, his anginal symptoms are stable.  As he does have relief with nitroglycerin at times, I have suggested that he try to increase his isosorbide to 45 mg daily.  Continue current dose of amlodipine, aspirin, metoprolol tartrate, ranolazine, rosuvastatin.  Follow-up with Dr. Acie Fredrickson in 3 months.  2. Essential hypertension Blood pressure is improved.  Adjust isosorbide as noted.  3. Nonrheumatic aortic valve stenosis 4. S/P TAVR (transcatheter aortic valve replacement) Normally functioning aortic valve prosthesis by echocardiogram 1/22.  Continue SBE prophylaxis.  5. Mixed hyperlipidemia Continue statin therapy.  6. Renal cell carcinoma, unspecified laterality [facility] He sees urology later this month for evaluation.  If he does require surgery for his renal carcinoma, he would be at moderate to high risk for CV complications.    Dispo:  Return in about 3 months (around 08/07/2020) for Routine Follow Up, w/ Dr. Acie Fredrickson, in person.   Medication Adjustments/Labs and Tests Ordered: Current medicines are reviewed at length with the patient today.  Concerns regarding medicines are outlined above.  Tests Ordered: No orders of the defined types were placed in this encounter.  Medication Changes: Meds ordered this encounter  Medications  . isosorbide mononitrate (IMDUR) 30 MG 24 hr tablet    Sig: Take 1.5 tablets (45 mg total) by mouth daily.  Dispense:  120 tablet    Refill:  3    Signed, Richardson Dopp, PA-C  05/07/2020 1:13 PM    [facility] [place], [place], [place]  [address] Phone: [phone]; Fax: [phone]

## 2020-05-07 ENCOUNTER — Other Ambulatory Visit: Payer: Self-pay

## 2020-05-07 ENCOUNTER — Ambulatory Visit: Payer: Medicare Other | Admitting: Physician Assistant

## 2020-05-07 ENCOUNTER — Encounter: Payer: Self-pay | Admitting: Physician Assistant

## 2020-05-07 VITALS — BP 140/50 | HR 53 | Ht 70.0 in | Wt 189.4 lb

## 2020-05-07 DIAGNOSIS — Z952 Presence of prosthetic heart valve: Secondary | ICD-10-CM | POA: Diagnosis not present

## 2020-05-07 DIAGNOSIS — I350 Nonrheumatic aortic (valve) stenosis: Secondary | ICD-10-CM

## 2020-05-07 DIAGNOSIS — R911 Solitary pulmonary nodule: Secondary | ICD-10-CM

## 2020-05-07 DIAGNOSIS — I5032 Chronic diastolic (congestive) heart failure: Secondary | ICD-10-CM

## 2020-05-07 DIAGNOSIS — C649 Malignant neoplasm of unspecified kidney, except renal pelvis: Secondary | ICD-10-CM

## 2020-05-07 DIAGNOSIS — I6523 Occlusion and stenosis of bilateral carotid arteries: Secondary | ICD-10-CM

## 2020-05-07 DIAGNOSIS — I10 Essential (primary) hypertension: Secondary | ICD-10-CM | POA: Diagnosis not present

## 2020-05-07 DIAGNOSIS — I25118 Atherosclerotic heart disease of native coronary artery with other forms of angina pectoris: Secondary | ICD-10-CM

## 2020-05-07 DIAGNOSIS — E782 Mixed hyperlipidemia: Secondary | ICD-10-CM

## 2020-05-07 MED ORDER — ISOSORBIDE MONONITRATE ER 30 MG PO TB24: 45.0000 mg | ORAL_TABLET | Freq: Every day | ORAL | 3 refills | Status: DC

## 2020-05-07 NOTE — Patient Instructions (Signed)
Medication Instructions:  Your physician has recommended you make the following change in your medication:   1.  Increase imdur one and one half tablet ( 45 mg) daily sent in today to pharmacy.   *If you need a refill on your cardiac medications before your next appointment, please call your pharmacy*   Lab Work: -None  If you have labs (blood work) drawn today and your tests are completely normal, you will receive your results only by: Marland Kitchen MyChart Message (if you have MyChart) OR . A paper copy in the mail If you have any lab test that is abnormal or we need to change your treatment, we will call you to review the results.   Testing/Procedures: -None   Follow-Up: At [facility], you and your health needs are our priority.  As part of our continuing mission to provide you with exceptional heart care, we have created designated Provider Care Teams.  These Care Teams include your primary Cardiologist (physician) and Advanced Practice Providers (APPs -  Physician Assistants and Nurse Practitioners) who all work together to provide you with the care you need, when you need it.  We recommend signing up for the patient portal called "MyChart".  Sign up information is provided on this After Visit Summary.  MyChart is used to connect with patients for Virtual Visits (Telemedicine).  Patients are able to view lab/test results, encounter notes, upcoming appointments, etc.  Non-urgent messages can be sent to your provider as well.   To learn more about what you can do with MyChart, go to [email or web address].    Your next appointment:   3 month(s) Friday, June 3 @ 11:40   The format for your next appointment:   In Person  Provider:   Mertie Moores, MD   Other Instructions If blood pressure runs too low with increase in imdur - OK to go back too one tablet by mouth ( 30 mg) daily.

## 2020-05-08 ENCOUNTER — Other Ambulatory Visit: Payer: Self-pay | Admitting: Cardiovascular Disease

## 2020-05-19 ENCOUNTER — Other Ambulatory Visit: Payer: Self-pay

## 2020-05-19 ENCOUNTER — Emergency Department (HOSPITAL_COMMUNITY): Payer: Medicare Other

## 2020-05-19 ENCOUNTER — Emergency Department (HOSPITAL_COMMUNITY)
Admission: EM | Admit: 2020-05-19 | Discharge: 2020-05-19 | Disposition: A | Discharge status: Home / Self Care | Payer: Medicare Other | Attending: Emergency Medicine | Admitting: Emergency Medicine

## 2020-05-19 ENCOUNTER — Encounter (HOSPITAL_COMMUNITY): Payer: Self-pay | Admitting: Emergency Medicine

## 2020-05-19 DIAGNOSIS — Z87891 Personal history of nicotine dependence: Secondary | ICD-10-CM | POA: Insufficient documentation

## 2020-05-19 DIAGNOSIS — I5032 Chronic diastolic (congestive) heart failure: Secondary | ICD-10-CM | POA: Insufficient documentation

## 2020-05-19 DIAGNOSIS — N1831 Chronic kidney disease, stage 3a: Secondary | ICD-10-CM | POA: Diagnosis not present

## 2020-05-19 DIAGNOSIS — Z7982 Long term (current) use of aspirin: Secondary | ICD-10-CM | POA: Diagnosis not present

## 2020-05-19 DIAGNOSIS — Z794 Long term (current) use of insulin: Secondary | ICD-10-CM | POA: Insufficient documentation

## 2020-05-19 DIAGNOSIS — Z8673 Personal history of transient ischemic attack (TIA), and cerebral infarction without residual deficits: Secondary | ICD-10-CM | POA: Insufficient documentation

## 2020-05-19 DIAGNOSIS — R0789 Other chest pain: Secondary | ICD-10-CM | POA: Diagnosis present

## 2020-05-19 DIAGNOSIS — I2510 Atherosclerotic heart disease of native coronary artery without angina pectoris: Secondary | ICD-10-CM | POA: Insufficient documentation

## 2020-05-19 DIAGNOSIS — Z79899 Other long term (current) drug therapy: Secondary | ICD-10-CM | POA: Insufficient documentation

## 2020-05-19 DIAGNOSIS — E1122 Type 2 diabetes mellitus with diabetic chronic kidney disease: Secondary | ICD-10-CM | POA: Diagnosis not present

## 2020-05-19 DIAGNOSIS — I130 Hypertensive heart and chronic kidney disease with heart failure and stage 1 through stage 4 chronic kidney disease, or unspecified chronic kidney disease: Secondary | ICD-10-CM | POA: Diagnosis not present

## 2020-05-19 DIAGNOSIS — Z7984 Long term (current) use of oral hypoglycemic drugs: Secondary | ICD-10-CM | POA: Diagnosis not present

## 2020-05-19 DIAGNOSIS — I10 Essential (primary) hypertension: Secondary | ICD-10-CM

## 2020-05-19 DIAGNOSIS — R079 Chest pain, unspecified: Secondary | ICD-10-CM

## 2020-05-19 LAB — BASIC METABOLIC PANEL
Anion gap: 7 (ref 5–15)
BUN: 15 mg/dL (ref 8–23)
CO2: 27 mmol/L (ref 22–32)
Calcium: 9.9 mg/dL (ref 8.9–10.3)
Chloride: 100 mmol/L (ref 98–111)
Creatinine, Ser: 1.02 mg/dL (ref 0.61–1.24)
GFR, Estimated: >60 mL/min (ref >60)
Glucose, Bld: 164 mg/dL — ABNORMAL HIGH (ref 70–99)
Potassium: 3.7 mmol/L (ref 3.5–5.1)
Sodium: 134 mmol/L — ABNORMAL LOW (ref 135–145)

## 2020-05-19 LAB — CBC
HCT: 44.2 % (ref 39.0–52.0)
Hemoglobin: 15.3 g/dL (ref 13.0–17.0)
MCH: 28.6 pg (ref 26.0–34.0)
MCHC: 34.6 g/dL (ref 30.0–36.0)
MCV: 82.6 fL (ref 80.0–100.0)
Platelets: 163 10*3/uL (ref 150–400)
RBC: 5.35 MIL/uL (ref 4.22–5.81)
RDW: 13.1 % (ref 11.5–15.5)
WBC: 6.6 10*3/uL (ref 4.0–10.5)
nRBC: 0 % (ref 0.0–0.2)

## 2020-05-19 LAB — TROPONIN I (HIGH SENSITIVITY)
Troponin I (High Sensitivity): 24 ng/L — ABNORMAL HIGH (ref <18)
Troponin I (High Sensitivity): 23 ng/L — ABNORMAL HIGH (ref <18)

## 2020-05-19 NOTE — ED Provider Notes (Signed)
[facility] EMERGENCY DEPARTMENT Provider Note   CSN: [id number] Arrival date & time: 05/19/20  1815     History Chief Complaint  Patient presents with  . Chest Pain    Alexis Griffith is a 80 y.o. male.  80 year old male presents with increased blood pressure at home as well as intermittent chest discomfort.  States that he chronically has chest discomfort which she describes as midsternal and nonradiating.  His symptoms wax and wane.  Sometimes he has to use nitroglycerin but he did not feel that he had to do that today.  Symptoms can last for seconds to several minutes.  It has not been associated with dyspnea or diaphoresis.  Is unchanged from his prior episodes.  Blood pressure elevated at home and states he has been compliant with his medications.  Denies any associated headaches.        Past Medical History:  Diagnosis Date  . Anxiety   . 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 CAD (coronary artery disease)   . Carotid artery disease ([place])    CTA 08/2019 of the head and neck show moderate 65 to 75% bilateral ICA stenoses   . Chronic diastolic CHF (congestive heart failure) ([place])   . Diabetes mellitus type 2 in nonobese (HCC)   . Excessive urination at night 06/05/2013  . 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 Hyperlipidemia   . Hypertension   . Hypertensive urgency   . Hypomagnesemia 09/11/2019  . Mild anemia 09/11/2019  . NSTEMI (non-ST elevated myocardial infarction) ([place]) 09/09/2019  . Orthostatic hypotension 09/11/2019  . Premature atrial contractions 09/12/2019  . Pulmonary nodules   . PVC (premature ventricular contraction)   . Renal mass   . 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 Burt Knack & Dr. Cyndia Bent  . Severe aortic stenosis   . Sinus bradycardia on ECG   . Sleep apnea   . 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 Wilburn Cornelia.  . Stroke  ([place])   . TIA (transient ischemic attack)   . Urinary retention 09/11/2019    Patient Active Problem List   Diagnosis Date Noted  . Orthostatic hypotension 03/28/2020  . Near syncope 03/27/2020  . Chronic kidney disease, stage 3a ([place]) 03/27/2020  . Chest pain at rest 12/17/2019  . Hypertension associated with diabetes ([place]) 12/01/2019  . Hyperlipidemia associated with type 2 diabetes mellitus ([place]) 12/01/2019  . UTI (urinary tract infection) due to urinary indwelling catheter ([place]) 10/20/2019  . UTI (urinary tract infection)   . Syncope   . Syncope and collapse 10/01/2019  . Abdominal pain   . CHF (congestive heart failure) ([place]) 09/25/2019  . Demand ischemia ([place]) 09/22/2019  . Premature atrial contractions 09/12/2019  . Urinary retention 09/11/2019  . Mild anemia 09/11/2019  . Hypomagnesemia 09/11/2019  . NSTEMI (non-ST elevated myocardial infarction) ([place]) 09/09/2019  . Elevated troponin   . Chest pain with high risk for cardiac etiology   . Hypertensive urgency   . Chronic diastolic CHF (congestive heart failure) ([place]) 08/15/2019  . 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 Diabetes mellitus type 2 in nonobese (HCC)   . Severe aortic stenosis   . Sleep apnea   . Renal mass   . CAD (coronary artery disease) 07/04/2019  . 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 Excessive urination at night 06/05/2013  . TIA (transient ischemic  attack) 07/27/2012  . 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 Carotid artery disease ([place]) 08/24/2011  . PVC's (premature ventricular contractions)   . Labile hypertension   . Hyperlipidemia LDL goal <70   . Anxiety   . Sinus bradycardia on ECG     Past Surgical History:  Procedure Laterality Date  . CATARACT EXTRACTION, BILATERAL  10/2015, and 11/2015   with adding  TORIC lenses bilaterally   . INTRAVASCULAR PRESSURE WIRE/FFR STUDY N/A 07/07/2019   Procedure: INTRAVASCULAR PRESSURE WIRE/FFR STUDY;  Surgeon: [name], MD;  Location: [facility] CV LAB;  Service: Cardiovascular;  Laterality: N/A;  . LEFT HEART CATH AND CORONARY ANGIOGRAPHY N/A 07/07/2019   Procedure: LEFT HEART CATH AND CORONARY ANGIOGRAPHY;  Surgeon: [name], MD;  Location: [facility] CV LAB;  Service: Cardiovascular;  Laterality: N/A;  . SQUAMOUS CELL CARCINOMA EXCISION     tongue  . TEE WITHOUT CARDIOVERSION N/A 08/15/2019   Procedure: TRANSESOPHAGEAL ECHOCARDIOGRAM (TEE);  Surgeon: Sherren Mocha, MD;  Location: [facility];  Service: Open Heart Surgery;  Laterality: N/A;  . TRANSCATHETER AORTIC VALVE REPLACEMENT, TRANSFEMORAL N/A 08/15/2019   Procedure: TRANSCATHETER AORTIC VALVE REPLACEMENT, TRANSFEMORAL using Edwards [employer] SAPIEN 3 Ultra 26 mm THV.;  Surgeon: Sherren Mocha, MD;  Location: [facility];  Service: Open Heart Surgery;  Laterality: N/A;       Family History  Problem Relation Age of Onset  . Heart disease Mother   . Heart attack Mother   . Heart disease Father   . Heart attack Father     Social History   Tobacco Use  . Smoking status: Former Smoker    Quit date: 06/25/1975    Years since quitting: 44.9  . Smokeless tobacco: Never Used  Vaping Use  . Vaping Use: Never used  Substance Use Topics  . Alcohol use: No  . Drug use: No    Home Medications Prior to Admission medications   Medication Sig Start Date End Date Taking? Authorizing Provider  acetaminophen (TYLENOL) 325 MG tablet Take 650 mg by mouth every 6 (six) hours as needed for mild pain.    [provider]  amLODipine (NORVASC) 5 MG tablet Take 1 tablet (5 mg total) by mouth daily. 04/19/20   Nahser, Wonda Cheng, MD  aspirin 81 MG chewable tablet Chew 1 tablet (81 mg total) by mouth daily. 10/03/19   Kayleen Memos, DO  clonazePAM (KLONOPIN) 0.5 MG tablet Take 0.5 mg by mouth daily.    [provider]  escitalopram (LEXAPRO) 10 MG tablet Take 10 mg by mouth daily.    [provider]  FENOFIBRATE PO Take 135 mg by mouth  daily.     [provider]  glucose blood test strip 1 each by Other route as needed.  05/12/12   [provider]  insulin glargine (LANTUS) 100 UNIT/ML injection Inject 60 Units into the skin daily.    [provider]  isosorbide mononitrate (IMDUR) 30 MG 24 hr tablet Take 1.5 tablets (45 mg total) by mouth daily. 05/07/20   Richardson Dopp T, PA-C  Lancets (ONETOUCH DELICA PLUS [id number]) MISC 1 each by Other route daily.  03/23/18   [provider]  losartan (COZAAR) 100 MG tablet Take 1 tablet (100 mg total) by mouth daily. 01/22/20   Nahser, Wonda Cheng, MD  magnesium oxide (MAG-OX) 400 MG tablet Take 400 mg by mouth daily.    [provider]  metoprolol tartrate (LOPRESSOR) 50 MG tablet TAKE  ONE TABLET BY MOUTH TWICE DAILY 05/08/20   Nahser, Wonda Cheng, MD  nitroGLYCERIN (NITROSTAT) 0.4 MG SL tablet Place 1 tablet (0.4 mg total) under the tongue every 5 (five) minutes as needed for chest pain (up to 3 doses). Do not give if blood pressure is low (less than 284 systolic). 02/05/20   Nahser, Wonda Cheng, MD  ONE TOUCH ULTRA TEST test strip 1 each by Other route daily.  05/12/12   [provider]  ranolazine (RANEXA) 500 MG 12 hr tablet TAKE 1 TABLET BY MOUTH TWICE DAILY. 01/31/20   Nahser, Wonda Cheng, MD  rosuvastatin (CRESTOR) 10 MG tablet Take 1 tablet (10 mg total) by mouth daily. 11/20/19 11/14/20  Nahser, Wonda Cheng, MD  sitaGLIPtin (JANUVIA) 100 MG tablet Take 100 mg by mouth daily.    [provider]    Allergies    Macrodantin [nitrofurantoin], Tape, Sulfa antibiotics, Lisinopril, Nutritional supplements, and Penicillins  Review of Systems   Review of Systems  All other systems reviewed and are negative.   Physical Exam Updated Vital Signs BP (!) 179/64   Pulse (!) 55   Temp 98.1 F (36.7 C)   Resp 14   SpO2 99%   Physical Exam Vitals and nursing note reviewed.  Constitutional:      General: He is not in acute distress.     Appearance: Normal appearance. He is well-developed. He is not toxic-appearing.  HENT:     Head: Normocephalic and atraumatic.  Eyes:     General: Lids are normal.     Conjunctiva/sclera: Conjunctivae normal.     Pupils: Pupils are equal, round, and reactive to light.  Neck:     Thyroid: No thyroid mass.     Trachea: No tracheal deviation.  Cardiovascular:     Rate and Rhythm: Normal rate and regular rhythm.     Heart sounds: Normal heart sounds. No murmur heard. No gallop.   Pulmonary:     Effort: Pulmonary effort is normal. No respiratory distress.     Breath sounds: Normal breath sounds. No stridor. No decreased breath sounds, wheezing, rhonchi or rales.  Abdominal:     General: Bowel sounds are normal. There is no distension.     Palpations: Abdomen is soft.     Tenderness: There is no abdominal tenderness. There is no rebound.  Musculoskeletal:        General: No tenderness. Normal range of motion.     Cervical back: Normal range of motion and neck supple.  Skin:    General: Skin is warm and dry.     Findings: No abrasion or rash.  Neurological:     Mental Status: He is alert and oriented to person, place, and time.     GCS: GCS eye subscore is 4. GCS verbal subscore is 5. GCS motor subscore is 6.     Cranial Nerves: No cranial nerve deficit.     Sensory: No sensory deficit.  Psychiatric:        Speech: Speech normal.        Behavior: Behavior normal.     ED Results / Procedures / Treatments   Labs (all labs ordered are listed, but only abnormal results are displayed) Labs Reviewed  BASIC METABOLIC PANEL - Abnormal; Notable for the following components:      Result Value   Sodium 134 (*)    Glucose, Bld 164 (*)    All other components within normal limits  TROPONIN I (HIGH SENSITIVITY) - Abnormal;  Notable for the following components:   Troponin I (High Sensitivity) 24 (*)    All other components within normal limits  CBC    EKG EKG  Interpretation  Date/Time:  Sunday May 19 2020 18:21:33 EDT Ventricular Rate:  58 PR Interval:  196 QRS Duration: 114 QT Interval:  428 QTC Calculation: 420 R Axis:   -2 Text Interpretation: Sinus bradycardia Minimal voltage criteria for LVH, may be normal variant ( Cornell product ) Nonspecific T wave abnormality Abnormal ECG No significant change since last tracing Confirmed by Lacretia Leigh ([id number]) on 05/19/2020 7:34:47 PM   Radiology DG Chest 2 View  Result Date: 05/19/2020 CLINICAL DATA:  Chest pain EXAM: CHEST - 2 VIEW COMPARISON:  None. FINDINGS: The heart size and mediastinal contours are within normal limits. Both lungs are clear. The visualized skeletal structures are unremarkable. IMPRESSION: No active cardiopulmonary disease. Electronically Signed   By: Prudencio Pair M.D.   On: 05/19/2020 19:21    Procedures Procedures   Medications Ordered in ED Medications - No data to display  ED Course  I have reviewed the triage vital signs and the nursing notes.  Pertinent labs & imaging results that were available during my care of the patient were reviewed by me and considered in my medical decision making (see chart for details).    MDM Rules/Calculators/A&P                          Patient is EKG without ischemic findings.  Delta troponin negative here.  Blood pressure noted and has been labile.  Patient has had issues with blood pressure control in the past.  Patient instructed to follow-up with his doctor tomorrow for blood pressure medication management.  Patient's chest pain is no different than what he has chronically. Final Clinical Impression(s) / ED Diagnoses Final diagnoses:  None    Rx / DC Orders ED Discharge Orders    None       Lacretia Leigh, MD 05/19/20 2228

## 2020-05-19 NOTE — ED Triage Notes (Signed)
C/o tightness to center of chest x 1 hour and elevated BP.  Denies SOB, nausea, and vomiting.

## 2020-05-19 NOTE — Discharge Instructions (Addendum)
Follow-up with your doctor tomorrow for blood pressure management

## 2020-06-11 ENCOUNTER — Ambulatory Visit (HOSPITAL_COMMUNITY)
Admission: RE | Admit: 2020-06-11 | Payer: Medicare Other | Source: Ambulatory Visit | Attending: Cardiovascular Disease | Admitting: Cardiovascular Disease

## 2020-07-24 ENCOUNTER — Telehealth: Payer: Self-pay | Admitting: Cardiovascular Disease

## 2020-07-24 NOTE — Telephone Encounter (Signed)
Patient called to see if he could get an ekg done today because of the way he been feeling. Please advise

## 2020-07-24 NOTE — Telephone Encounter (Signed)
Spent almost 28 min on phone with pt. Pt reports that he has had PVCs in the past and [facility] and they did EKG but they said it looked ok.  [place] is faxing a copy to our office for Nahser review. When he got home from the office he states that it started all over again, "it will beat 1, 2, 3 and then stop".   He took 3 Lopressors yesterday d/t this and states Dr Acie Fredrickson told him he could take an extra if needed for this issue. States he was up most of the night with anxiety, couldn't sleep and finally took an extra Klonopin around 3/4 and went to sleep. BP this morning 127/60 or "something like that", but it goes up as day progresses. He states, "I have a tendency to take my pulse". He also mentions he has been told by Dr. Acie Fredrickson and RN before to stop doing that and only take it up to twice a day unless having symptoms. "I have a lot of anxiety problems and primary wants me to see a physiatrist." Pt feels this is 98-99% anxiety driven. Aware to monitor for now and call back if PVCs worsen.  However, informed that he may need a monitor to determine PVC burden if continues.  Aware that EKG today will not do any good to guide [place] in treatment plan and explained why monitor would be needed for that. Aware that I will forward to Dr. Elmarie Shiley RN to review/FYI. Aware she will follow up with him after return to office but that will most likely wait to do anything until scheduled follow up next week. Patient verbalized understanding and agreeable to plan.

## 2020-08-01 ENCOUNTER — Encounter: Payer: Self-pay | Admitting: Cardiovascular Disease

## 2020-08-01 NOTE — Progress Notes (Signed)
Cardiology Office Note   Date:  08/02/2020   ID:  Alexis, Griffith [birth year], MRN [id number]  PCP:  Haywood Pao, MD  Cardiologist:   Mertie Moores, MD   Chief Complaint  Patient presents with  . Coronary Artery Disease  . Aortic Stenosis   Problem List  1. Premature ventricular contractions 2. Anxiety 3. Hypertension 4. Hyperlipidemia 5. 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 Bilateral carotid artery disease - moderate 7. 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 TIA 9.  Aortic stenosis:   S/p TAVR      Alexis Griffith is a 80 y.o. male who presents for further evaluation of his palpitations and HTN.  He is having some muscle aches - especially in the morning.  Seems to be bilateral shoulder pain. He has held the pravachol for several weeks but the pain did not resolved. BP has been a little high. Tried taking 1 1/2 of the Losartan / HCTZ and has occasionally takes extra metoprolol.  September 20, 2014:  Alexis Griffith is doing well.  BP has been well controlled.  Tried a dose of Valsartan and got dizzy, he went back to Losartan after that and has done well.  He needs to have cataract surgery .   Jan. 30, 2017: Doing well  He did not have his cataract surgery as scheduled.    Recent labs look good Aches and pains but otherwise doing ok  Aug. 16, 2017:  Doing ok BP has been well controlled.  Having some left shoulder problems   - hurts at night. 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  No CP ,   Still has occasional elevated HTN  Sept. 25, 2018: Doing well.  Is followed by [name] for a past CVA and is on Aggrenox ( flagged the ASA dose alert )   Jul 13, 2017:   Alexis Griffith is doing well. Is not exercising  Is having some balance issues.   No CP  Still stress in his life.   PVCs seem to be well controlled.  Is taking metoprolol 50 mg TID   April 12, 2018:  No CP , no dyspnea.  Palpitations are under control   May 24, 2019: Alexis Griffith is seen today for follow-up of his hypertension, palpitations, mild aortic stenosis, hyperlipidemia, anxiety. Has rare episodes of chest pain - randomly  No exercising regularly at all.    Jul 04, 2019:  Alexis Griffith is seen today.  He had complained of chest pain at his last office visit . 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  Ct also showd a 4 mm pulmonary nodule.  He is here to discuss cath .  The cp is on and off.   At times its related to exertion He is not exercising regularly .   Sept. 20, 2021 Alexis Griffith is s/p TAVR since I last saw him. Also recent hospitalization for sepsis, syncope   Hx of CAD, AS, HLD, DM, ? Left renal mass ( ? Cancer )   On Aug. 20 he was admitted with syncope.  Had an indwelling foley develped sepsis from urosepsis  Work up also identified pulmonary nodules.  Has an indwelling foley catheter.   Due to BPH, Also has a left renal mass.    Oct. 28, 2021: Alexis Griffith is seen for follow up of his recent TAVR. He has CAD. Has been treated medically .  he was recently admitted with cp. He ruled out for MI He was found to be hypertensive - he had skipped several of his meds that AM. Still has foley catheter.  Was at the hospital recently , was given hydralizine - has never started  Also was changed form losartan  to losartan but he also did not make that change either.  Nov. 22, 2021: Alexis Griffith is seen for recent worsening of leg edema Hx of CAD, CHF, TAVR, renal cell CA His BP has been on the low side Will DC Losartan HCT and have him take Losartan 100 mg a day goig forward Will give him a separate script for HCTZ at some point but only if he needs it   Feb. 4, 2022 Alexis Griffith is seen today for follow up of his TAVR, CHF, CAD He continues to have issues with syncope as well as HTN Was [facility] Jan[facility] for near syncope.  He had been out shopping with his wife,  Got dizzy.   Thought he  was going to pass out. Walked out and felt better.   Thinks he may have just had an anxiety attack Troponin was found to be 25. , 33 Was seen by Dr. Loletha Grayer.  His metoprolol was increased to 100 mg BID He found his HR to be in the low 50 so he did not continue the high dose  Now is back on metoprolol 50 BID .  He avoids salt  His BP tends to increase in the evingings and at night  Will add amlodipine 2.5 at noon .  He will cont to measure BP   Feb. 18, 2022:  Alexis Griffith is seen today for some increasing cp. He has known coronary artery disease.  He also has had some hypertension recently.  We started him on amlodipine 2.5 mg at his last office visit. Has been given a script for Lexipro  Still has CP,  NTG  Does not help it Radiates to his left scapula .    Dull achy , constant pain .   All day long  His pain is not related to exercise, eating or drinking or change of position.   August 02, 2020: Alexis Griffith is seen today for follow up of his CAD and AS - s/p TAVR NYHA class II-III ( does not do much )   KCCQ score  -   BP is ok Still has some chest wall pain , Not much angina Advised him to exercise - needs to get out and walk    Past Medical History:  Diagnosis Date  . Anxiety   . 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 CAD (coronary artery disease)   . Carotid artery disease ([place])    CTA 08/2019 of the head and neck show moderate 65 to 75% bilateral ICA stenoses   . Chronic diastolic CHF (congestive heart failure) ([place])   . Diabetes mellitus type 2 in nonobese (HCC)   . Excessive urination at night 06/05/2013  . Giant cell arteritis ([place]) 05/17/2012  . Hyperlipidemia   . Hypertension   . Hypertensive urgency   . Hypomagnesemia 09/11/2019  . Mild anemia 09/11/2019  . NSTEMI (non-ST elevated myocardial infarction) ([place]) 09/09/2019  . Orthostatic hypotension 09/11/2019  . Premature atrial contractions 09/12/2019  . Pulmonary nodules   . PVC (premature ventricular contraction)   . Renal mass   . 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 Burt Knack & Dr. Cyndia Bent  . Severe aortic stenosis   . Sinus bradycardia on ECG   . Sleep apnea   . Squamous cell carcinoma of tongue [facility]) September 2012   Followed by Dr. Wilburn Cornelia.  . Stroke ([place])   . TIA (transient ischemic attack)   . Urinary retention 09/11/2019    Past Surgical History:  Procedure Laterality Date  . CATARACT EXTRACTION, BILATERAL  10/2015, and 11/2015   with adding  TORIC lenses bilaterally   . INTRAVASCULAR PRESSURE WIRE/FFR STUDY N/A 07/07/2019   Procedure: INTRAVASCULAR PRESSURE WIRE/FFR STUDY;  Surgeon: [name], MD;  Location: [place] [facility];  Service: Cardiovascular;  Laterality: N/A;  . LEFT HEART CATH AND CORONARY ANGIOGRAPHY N/A 07/07/2019   Procedure: LEFT HEART CATH AND CORONARY ANGIOGRAPHY;  Surgeon: [name], MD;  Location: [place] [facility];  Service: Cardiovascular;  Laterality: N/A;  . SQUAMOUS CELL CARCINOMA EXCISION     tongue  . TEE WITHOUT CARDIOVERSION N/A 08/15/2019   Procedure: TRANSESOPHAGEAL ECHOCARDIOGRAM (TEE);  Surgeon: Sherren Mocha, MD;  Location: [place];  Service: Open Heart Surgery;  Laterality: N/A;  . TRANSCATHETER AORTIC VALVE REPLACEMENT, TRANSFEMORAL N/A 08/15/2019   Procedure: TRANSCATHETER AORTIC VALVE REPLACEMENT, TRANSFEMORAL using Edwards [employer] SAPIEN 3 Ultra 26 mm THV.;  Surgeon: Sherren Mocha, MD;  Location: [place];  Service: Open Heart Surgery;  Laterality: N/A;     Current Outpatient Medications  Medication Sig Dispense Refill  . acetaminophen (TYLENOL) 325 MG tablet Take 650 mg by mouth every 6 (six) hours as needed for mild pain.    Marland Kitchen amLODipine (NORVASC) 5 MG tablet Take 1 tablet (5 mg total) by mouth daily. 180 tablet 3  . aspirin 81 MG chewable tablet Chew 1 tablet (81 mg total) by mouth daily. 360 tablet 0  . clonazePAM (KLONOPIN) 0.5 MG tablet Take 0.5 mg by mouth daily.    .  FENOFIBRATE PO Take 135 mg by mouth daily.     Marland Kitchen glucose blood test strip 1 each by Other route as needed.     . insulin glargine (LANTUS) 100 UNIT/ML injection Inject 60 Units into the skin daily.    . isosorbide mononitrate (IMDUR) 30 MG 24 hr tablet Take 1.5 tablets (45 mg total) by mouth daily. 120 tablet 3  . Lancets (ONETOUCH DELICA PLUS [id number]) MISC 1 each by Other route daily.     Marland Kitchen losartan (COZAAR) 100 MG tablet Take 1 tablet (100 mg total) by mouth daily. 90 tablet 3  . magnesium oxide (MAG-OX) 400 MG tablet Take 400 mg by mouth daily.    . metoprolol tartrate (LOPRESSOR) 50 MG tablet TAKE ONE TABLET BY MOUTH TWICE DAILY 180 tablet 3  . nitroGLYCERIN (NITROSTAT) 0.4 MG SL tablet Place 1 tablet (0.4 mg total) under the tongue every 5 (five) minutes as needed for chest pain (up to 3 doses). Do not give if blood pressure is low (less than 638 systolic). 25 tablet 7  . ONE TOUCH ULTRA TEST test strip 1 each by Other route daily.     . ranolazine (RANEXA) 500 MG 12 hr tablet TAKE 1 TABLET BY MOUTH TWICE DAILY. 180 tablet 3  . rosuvastatin (CRESTOR) 10 MG tablet Take 1 tablet (10 mg total) by mouth daily. 90 tablet 3  . sitaGLIPtin (JANUVIA) 100 MG tablet Take 100 mg by mouth daily.     No current facility-administered medications for this visit.    Allergies:   Macrodantin [nitrofurantoin], Tape,  Sulfa antibiotics, Lisinopril, Nutritional supplements, and Penicillins    Social History:  The patient  reports that he quit smoking about 45 years ago. He has never used smokeless tobacco. He reports that he does not drink alcohol and does not use drugs.   Family History:  The patient's family history includes Heart attack in his father and mother; Heart disease in his father and mother.    ROS:   Noted in current history, otherwise review of systems is negative.   Physical Exam: Blood pressure 132/64, pulse (!) 54, height 5\' 10"  (1.778 m), weight 192 lb 3.2 oz (87.2 kg), SpO2 96  %.  GEN:  Well nourished, well developed in no acute distress HEENT: Normal NECK: No JVD , soft R carotid bruit.  LYMPHATICS: No lymphadenopathy CARDIAC: RRR ,  Soft systolic murmur  RESPIRATORY:  Clear to auscultation without rales, wheezing or rhonchi  ABDOMEN: Soft, non-tender, non-distended MUSCULOSKELETAL:  No edema; No deformity  SKIN: Warm and dry NEUROLOGIC:  Alert and oriented x 3    EKG:   Recent Labs: 09/09/2019: B Natriuretic Peptide 326.2 10/01/2019: TSH 1.472 10/21/2019: ALT 15 03/28/2020: Magnesium 1.8 05/19/2020: BUN 15; Creatinine, Ser 1.02; Hemoglobin 15.3; Platelets 163; Potassium 3.7; Sodium 134    Lipid Panel    Component Value Date/Time   CHOL 130 09/09/2019 0308   CHOL 139 06/14/2019 0959   TRIG 68 09/09/2019 0308   HDL 40 (L) 09/09/2019 0308   HDL 35 (L) 06/14/2019 0959   CHOLHDL 3.3 09/09/2019 0308   VLDL 14 09/09/2019 0308   LDLCALC 76 09/09/2019 0308   LDLCALC 80 06/14/2019 0959      Wt Readings from Last 3 Encounters:  08/02/20 192 lb 3.2 oz (87.2 kg)  05/19/20 190 lb (86.2 kg)  05/07/20 189 lb 6.4 oz (85.9 kg)      Other studies Reviewed: Additional studies/ records that were reviewed today include: . Review of the above records demonstrates:    ASSESSMENT AND PLAN:  1.  Coronary artery disease:     No real angina.  Has MSK chest wall pain . Advised more exercise     2.  Aortic stenosis-  S/p TAVR  Seems to be doing well.     3. Hypertension - BP is well controlled.     4. Hyperlipidemia -    Managed by primary    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 Bilateral carotid artery disease -   Has a soft systolic bruit. .  Scheduled for repeat carotid duplex scan in several months     Current medicines are reviewed at length with the patient today.  The patient does not have concerns regarding medicines.  The following changes have been made:  no change   Disposition:          Signed, Mertie Moores, MD  08/02/2020 11:55 AM    [facility] [address], [place], [place]  [address] Phone: [phone]; Fax: [phone]

## 2020-08-02 ENCOUNTER — Encounter: Payer: Self-pay | Admitting: Cardiovascular Disease

## 2020-08-02 ENCOUNTER — Other Ambulatory Visit: Payer: Self-pay

## 2020-08-02 ENCOUNTER — Ambulatory Visit (HOSPITAL_COMMUNITY): Payer: Medicare Other | Attending: Cardiology

## 2020-08-02 ENCOUNTER — Ambulatory Visit: Payer: Medicare Other | Admitting: Cardiovascular Disease

## 2020-08-02 ENCOUNTER — Other Ambulatory Visit (HOSPITAL_COMMUNITY): Payer: Self-pay | Admitting: Physician Assistant

## 2020-08-02 VITALS — BP 132/64 | HR 54 | Ht 70.0 in | Wt 192.2 lb

## 2020-08-02 DIAGNOSIS — I350 Nonrheumatic aortic (valve) stenosis: Secondary | ICD-10-CM

## 2020-08-02 DIAGNOSIS — Z953 Presence of xenogenic heart valve: Secondary | ICD-10-CM

## 2020-08-02 DIAGNOSIS — I25118 Atherosclerotic heart disease of native coronary artery with other forms of angina pectoris: Secondary | ICD-10-CM | POA: Diagnosis not present

## 2020-08-02 DIAGNOSIS — Z952 Presence of prosthetic heart valve: Secondary | ICD-10-CM

## 2020-08-02 DIAGNOSIS — R0989 Other specified symptoms and signs involving the circulatory and respiratory systems: Secondary | ICD-10-CM | POA: Diagnosis not present

## 2020-08-02 LAB — ECHOCARDIOGRAM COMPLETE
AV Mean grad: 10 mmHg
AV Peak grad: 17.3 mmHg
Ao pk vel: 2.08 m/s
Area-P 1/2: 2.48 cm2
S' Lateral: 3.1 cm

## 2020-08-02 NOTE — Patient Instructions (Addendum)
Medication Instructions:  Your physician recommends that you continue on your current medications as directed. Please refer to the Current Medication list given to you today.  *If you need a refill on your cardiac medications before your next appointment, please call your pharmacy*   Lab Work: None If you have labs (blood work) drawn today and your tests are completely normal, you will receive your results only by: Marland Kitchen MyChart Message (if you have MyChart) OR . A paper copy in the mail If you have any lab test that is abnormal or we need to change your treatment, we will call you to review the results.   Testing/Procedures: None   Follow-Up: At [facility], you and your health needs are our priority.  As part of our continuing mission to provide you with exceptional heart care, we have created designated Provider Care Teams.  These Care Teams include your primary Cardiologist (physician) and Advanced Practice Providers (APPs -  Physician Assistants and Nurse Practitioners) who all work together to provide you with the care you need, when you need it.  We recommend signing up for the patient portal called "MyChart".  Sign up information is provided on this After Visit Summary.  MyChart is used to connect with patients for Virtual Visits (Telemedicine).  Patients are able to view lab/test results, encounter notes, upcoming appointments, etc.  Non-urgent messages can be sent to your provider as well.   To learn more about what you can do with MyChart, go to [email or web address].    Your next appointment:   1 year(s)  The format for your next appointment:   In Person  Provider:   You may see Mertie Moores, MD or one of the following Advanced Practice Providers on your designated Care Team:    Richardson Dopp, PA-C  Leanor Kail, [place]    Other Instructions

## 2020-08-20 ENCOUNTER — Ambulatory Visit (HOSPITAL_COMMUNITY): Payer: Medicare Other

## 2020-09-04 ENCOUNTER — Other Ambulatory Visit: Payer: Self-pay

## 2020-09-04 ENCOUNTER — Ambulatory Visit (HOSPITAL_COMMUNITY)
Admission: RE | Admit: 2020-09-04 | Discharge: 2020-09-04 | Disposition: A | Discharge status: Home / Self Care | Payer: Medicare Other | Source: Ambulatory Visit | Attending: Internal Medicine | Admitting: Internal Medicine

## 2020-09-04 ENCOUNTER — Other Ambulatory Visit (HOSPITAL_COMMUNITY): Payer: Self-pay | Admitting: Cardiovascular Disease

## 2020-09-04 DIAGNOSIS — I6523 Occlusion and stenosis of bilateral carotid arteries: Secondary | ICD-10-CM | POA: Diagnosis not present

## 2020-09-04 DIAGNOSIS — I2510 Atherosclerotic heart disease of native coronary artery without angina pectoris: Secondary | ICD-10-CM | POA: Diagnosis not present

## 2020-09-14 ENCOUNTER — Other Ambulatory Visit: Payer: Self-pay

## 2020-09-14 ENCOUNTER — Encounter: Payer: Self-pay | Admitting: Emergency Medicine

## 2020-09-14 ENCOUNTER — Ambulatory Visit
Admission: EM | Admit: 2020-09-14 | Discharge: 2020-09-14 | Disposition: A | Discharge status: Home / Self Care | Payer: Medicare Other | Attending: Family Medicine | Admitting: Family Medicine

## 2020-09-14 DIAGNOSIS — I493 Ventricular premature depolarization: Secondary | ICD-10-CM

## 2020-09-14 DIAGNOSIS — L089 Local infection of the skin and subcutaneous tissue, unspecified: Secondary | ICD-10-CM

## 2020-09-14 DIAGNOSIS — S61215A Laceration without foreign body of left ring finger without damage to nail, initial encounter: Secondary | ICD-10-CM

## 2020-09-14 NOTE — ED Provider Notes (Signed)
[facility]    CSN: [id number] Arrival date & time: 09/14/20  6606      History   Chief Complaint Chief Complaint  Patient presents with   Laceration    Left 4th finger    HPI Alexis Griffith is a 80 y.o. male.   HPI Patient presents today with left 4th finger laceration which he sustained yesterday. He saw his PCP yesterday and wound was stabilized with a butterfly bandage.  Today upon awakening when he went to change the bandage he noticed a large amount of bleeding from the wound and is here today for evaluation.  He did receive an updated tetanus vaccine on yesterday.  He is also diabetic.    Past Medical History:  Diagnosis Date   Anxiety    Bladder neck obstruction 06/05/2013   CAD (coronary artery disease)    Carotid artery disease (HCC)    CTA 08/2019 of the head and neck show moderate 65 to 75% bilateral ICA stenoses    Chronic diastolic CHF (congestive heart failure) (HCC)    Diabetes mellitus type 2 in nonobese (HCC)    Excessive urination at night 06/05/2013   Giant cell arteritis ([place]) 05/17/2012   Hyperlipidemia    Hypertension    Hypertensive urgency    Hypomagnesemia 09/11/2019   Mild anemia 09/11/2019   NSTEMI (non-ST elevated myocardial infarction) ([place]) 09/09/2019   Orthostatic hypotension 09/11/2019   Premature atrial contractions 09/12/2019   Pulmonary nodules    PVC (premature ventricular contraction)    Renal mass    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 Burt Knack & Dr. 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 Wilburn Cornelia.   Stroke [facility])    TIA (transient ischemic attack)    Urinary retention 09/11/2019    Patient Active Problem List   Diagnosis Date Noted   Orthostatic hypotension 03/28/2020   Near syncope 03/27/2020   Chronic kidney disease, stage  3a ([place]) 03/27/2020   Chest pain at rest 12/17/2019   Hypertension associated with diabetes ([place]) 12/01/2019   Hyperlipidemia associated with type 2 diabetes mellitus ([place]) 12/01/2019   UTI (urinary tract infection) due to urinary indwelling catheter ([place]) 10/20/2019   UTI (urinary tract infection)    Syncope    Syncope and collapse 10/01/2019   Abdominal pain    CHF (congestive heart failure) ([place]) 09/25/2019   Demand ischemia ([place]) 09/22/2019   Premature atrial contractions 09/12/2019   Urinary retention 09/11/2019   Mild anemia 09/11/2019   Hypomagnesemia 09/11/2019   NSTEMI (non-ST elevated myocardial infarction) ([place]) 09/09/2019   Elevated troponin    Chest pain with high risk for cardiac etiology    Hypertensive urgency    Chronic diastolic CHF (congestive heart failure) ([place]) 08/15/2019   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   Diabetes mellitus type 2 in nonobese [facility])    Severe aortic stenosis    Sleep apnea    Renal mass    CAD (coronary artery disease) 07/04/2019   Bladder neck obstruction 06/05/2013   Excessive urination at night 06/05/2013   TIA (transient ischemic attack) 07/27/2012   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   Carotid artery disease ([place]) 08/24/2011   PVC's (premature ventricular contractions)    Labile hypertension  Hyperlipidemia LDL goal <70    Anxiety    Sinus bradycardia on ECG     Past Surgical History:  Procedure Laterality Date   CATARACT EXTRACTION, BILATERAL  10/2015, and 11/2015   with adding  TORIC lenses bilaterally    INTRAVASCULAR PRESSURE WIRE/FFR STUDY N/A 07/07/2019   Procedure: INTRAVASCULAR PRESSURE WIRE/FFR STUDY;  Surgeon: [name], MD;  Location: [facility];  Service: Cardiovascular;  Laterality: N/A;   LEFT HEART CATH AND CORONARY ANGIOGRAPHY N/A 07/07/2019   Procedure: LEFT HEART CATH AND CORONARY ANGIOGRAPHY;  Surgeon: [name], MD;  Location: [facility];  Service:  Cardiovascular;  Laterality: N/A;   SQUAMOUS CELL CARCINOMA EXCISION     tongue   TEE WITHOUT CARDIOVERSION N/A 08/15/2019   Procedure: TRANSESOPHAGEAL ECHOCARDIOGRAM (TEE);  Surgeon: Sherren Mocha, MD;  Location: [place];  Service: Open Heart Surgery;  Laterality: N/A;   TRANSCATHETER AORTIC VALVE REPLACEMENT, TRANSFEMORAL N/A 08/15/2019   Procedure: TRANSCATHETER AORTIC VALVE REPLACEMENT, TRANSFEMORAL using Edwards [employer] SAPIEN 3 Ultra 26 mm THV.;  Surgeon: Sherren Mocha, MD;  Location: [facility];  Service: Open Heart Surgery;  Laterality: N/A;       Home Medications    Prior to Admission medications   Medication Sig Start Date End Date Taking? Authorizing Provider  acetaminophen (TYLENOL) 325 MG tablet Take 650 mg by mouth every 6 (six) hours as needed for mild pain.    [provider]  amLODipine (NORVASC) 5 MG tablet Take 1 tablet (5 mg total) by mouth daily. 04/19/20   Nahser, Wonda Cheng, MD  aspirin 81 MG chewable tablet Chew 1 tablet (81 mg total) by mouth daily. 10/03/19   Kayleen Memos, DO  clonazePAM (KLONOPIN) 0.5 MG tablet Take 0.5 mg by mouth daily.    [provider]  FENOFIBRATE PO Take 135 mg by mouth daily.     [provider]  glucose blood test strip 1 each by Other route as needed.  05/12/12   [provider]  insulin glargine (LANTUS) 100 UNIT/ML injection Inject 60 Units into the skin daily.    [provider]  isosorbide mononitrate (IMDUR) 30 MG 24 hr tablet Take 1.5 tablets (45 mg total) by mouth daily. 05/07/20   Richardson Dopp T, PA-C  Lancets (ONETOUCH DELICA PLUS OVFIEP32R) MISC 1 each by Other route daily.  03/23/18   [provider]  losartan (COZAAR) 100 MG tablet Take 1 tablet (100 mg total) by mouth daily. 01/22/20   Nahser, Wonda Cheng, MD  magnesium oxide (MAG-OX) 400 MG tablet Take 400 mg by mouth daily.    [provider]  metoprolol tartrate (LOPRESSOR) 50 MG tablet TAKE ONE TABLET BY MOUTH TWICE  DAILY 05/08/20   Nahser, Wonda Cheng, MD  nitroGLYCERIN (NITROSTAT) 0.4 MG SL tablet Place 1 tablet (0.4 mg total) under the tongue every 5 (five) minutes as needed for chest pain (up to 3 doses). Do not give if blood pressure is low (less than 518 systolic). 02/05/20   Nahser, Wonda Cheng, MD  ONE TOUCH ULTRA TEST test strip 1 each by Other route daily.  05/12/12   [provider]  ranolazine (RANEXA) 500 MG 12 hr tablet TAKE 1 TABLET BY MOUTH TWICE DAILY. 01/31/20   Nahser, Wonda Cheng, MD  rosuvastatin (CRESTOR) 10 MG tablet Take 1 tablet (10 mg total) by mouth daily. 11/20/19 11/14/20  Nahser, Wonda Cheng, MD  sitaGLIPtin (JANUVIA) 100 MG tablet Take 100 mg by mouth daily.  [provider]    Family History Family History  Problem Relation Age of Onset   Heart disease Mother    Heart attack Mother    Heart disease Father    Heart attack Father     Social History Social History   Tobacco Use   Smoking status: Former    Types: Cigarettes    Quit date: 06/25/1975    Years since quitting: 45.2   Smokeless tobacco: Never  Vaping Use   Vaping Use: Never used  Substance Use Topics   Alcohol use: No   Drug use: No     Allergies   Macrodantin [nitrofurantoin], Tape, Sulfa antibiotics, Lisinopril, Nutritional supplements, and Penicillins   Review of Systems Review of Systems Pertinent negatives listed in HPI   Physical Exam Triage Vital Signs ED Triage Vitals  Enc Vitals Group     BP 09/14/20 1003 (!) 152/64     Pulse Rate 09/14/20 1003 60     Resp 09/14/20 1003 18     Temp 09/14/20 1003 (!) 97.3 F (36.3 C)     Temp Source 09/14/20 1003 Oral     SpO2 09/14/20 1003 95 %     Weight --      Height --      Head Circumference --      Peak Flow --      Pain Score 09/14/20 1005 2     Pain Loc --      Pain Edu? --      Excl. in [place]? --    No data found.  Updated Vital Signs BP (!) 152/64 (BP Location: Left Arm)   Pulse 60   Temp (!) 97.3 F (36.3 C) (Oral)    Resp 18   SpO2 95%   Visual Acuity Right Eye Distance:   Left Eye Distance:   Bilateral Distance:    Right Eye Near:   Left Eye Near:    Bilateral Near:     Physical Exam Cardiovascular:     Rate and Rhythm: Normal rate.  Pulmonary:     Effort: Pulmonary effort is normal.  Skin:    General: Skin is warm.     Capillary Refill: Capillary refill takes less than 2 seconds.     Comments: Laceration of 4th tip of finger . Laceration poorly approximated. Length 4 cm  Neurological:     Mental Status: He is alert.  Psychiatric:        Mood and Affect: Mood normal.        Behavior: Behavior normal.        Thought Content: Thought content normal.     UC Treatments / Results  Labs (all labs ordered are listed, but only abnormal results are displayed) Labs Reviewed - No data to display  EKG   Radiology No results found.  Procedures Laceration Repair  Date/Time: 09/14/2020 11:24 AM Performed by: Scot Jun, FNP Authorized by: Scot Jun, FNP   Consent:    Consent obtained:  Verbal   Consent given by:  Patient   Risks discussed:  Infection, need for additional repair, pain, poor cosmetic result and poor wound healing   Alternatives discussed:  No treatment and delayed treatment Universal protocol:    Procedure explained and questions answered to patient or proxy's satisfaction: yes     Relevant documents present and verified: yes     Test results available: yes     Imaging studies available: yes     Required blood  products, implants, devices, and special equipment available: yes     Site/side marked: yes     Immediately prior to procedure, a time out was called: yes     Patient identity confirmed:  Verbally with patient Anesthesia:    Anesthesia method:  Local infiltration   Local anesthetic:  Lidocaine 1% w/o epi Laceration details:    Location:  Finger   Finger location:  L ring finger   Length (cm):  4   Depth (mm):  1 Skin repair:    Repair  method:  Sutures and Steri-Strips   Suture size:  4-0   Suture material:  Prolene   Suture technique:  Simple interrupted   Number of sutures:  2   Number of Steri-Strips:  2 Approximation:    Approximation:  Loose Repair type:    Repair type:  Simple Post-procedure details:    Dressing:  Bulky dressing   Procedure completion:  Tolerated (including critical care time)  Medications Ordered in UC Medications - No data to display  Initial Impression / Assessment and Plan / UC Course  I have reviewed the triage vital signs and the nursing notes.  Pertinent labs & imaging results that were available during my care of the patient were reviewed by me and considered in my medical decision making (see chart for details).    Laceration of left ing finger, repaired with 2 sutures and two steri streps. Wound care instructions provided. Return in 7 days to for suture removal. Take (home supply) of Doxycyline twice daily x 5 days.  Final Clinical Impressions(s) / UC Diagnoses   Final diagnoses:  Laceration of left ring finger, foreign body presence unspecified, nail damage status unspecified, initial encounter  Superficial skin infection     Discharge Instructions      Start doxycycline 100 mg take twice daily for 5 days from your current home supply. Change dressing twice daily.  Return here to urgent care in 7 days to have suture material removed. You may take showers and bathe as usual.  The Steri-Strips that have applied to the lateral aspect of your fingertip will fall off which is expected.  Allow steri strips to fall off  independently do not remove.     ED Prescriptions   None    PDMP not reviewed this encounter.   Scot Jun, FNP 09/14/20 1125

## 2020-09-14 NOTE — Discharge Instructions (Addendum)
Start doxycycline 100 mg take twice daily for 5 days from your current home supply. Change dressing twice daily.  Return here to urgent care in 7 days to have suture material removed. You may take showers and bathe as usual.  The Steri-Strips that have applied to the lateral aspect of your fingertip will fall off which is expected.  Allow steri strips to fall off  independently do not remove.

## 2020-09-14 NOTE — ED Triage Notes (Signed)
PT lacerated left 4th finger yesterday at 10am. He saw primary care and was given "butterfly bandage." PT removed external wrap this morning and butterfly bandage was stuck and came with it.

## 2020-09-15 ENCOUNTER — Telehealth: Payer: Self-pay | Admitting: Emergency Medicine

## 2020-09-15 MED ORDER — CEPHALEXIN 500 MG PO CAPS: 1000.0000 mg | ORAL_CAPSULE | Freq: Two times a day (BID) | ORAL | 0 refills | Status: AC

## 2020-09-15 NOTE — Telephone Encounter (Signed)
Needs to change doxy- making him nauseated   Will change to keflex 1000 mg BID for 5 days per Dr. Alphonzo Cruise.   PT aware of change, instructed to DC doxy. Confirms that he has no known allergy to cephalosporins.

## 2020-09-16 ENCOUNTER — Telehealth: Payer: Self-pay | Admitting: Cardiovascular Disease

## 2020-09-16 NOTE — Telephone Encounter (Signed)
Spoke with patient in detail regarding his blood pressure.  He reports since he had the TAVR surgery his BP has been lower in the mornings.  He never had this issue prior to the surgery.  BP is now 115-120/40s HR in 50s - in the AM a gradually goes up throughout the day.  At times he does report having some dizziness in the AM.  Pt admits his anxiety is a lot of his problem and has been for a long time.  He reports anxiety builds throughout the day.  When this occurs he feels flushed and he has tightness in his head.  He can not watch TV all longer because it causes so much anxiety, long with other things he used to enjoy.  He has not been sleeping well since his TAVR surgery.  He reports having trouble falling asleep and then is waking 5-6 times every night.  He does not have any medication to treat insomnia.  In the past he took Clonazepam  0.5 mg up to 4 times a day for his anxiety.  His PCP decreased that to only 1 tablet daily now which is causing him to be more anxious.  Advised to discuss with his PCP regarding other options for the treatment of anxiety.  He reports he saw a NP before who gave him Lexapro.  Pt reports he only took it twice and doesn't know why he "just didn't feel comfortable taking it."  He will discuss further with PCP.   Pt reports taking his BP multiple times during the day as well as at night when he wakes.  He is aware continuing to take his blood pressure multiple times will further cause it to increase.   He is concerned about the AM BP which he takes after getting up and eating breakfast but before his AM medication.  Advised I will forward this information to Dr Acie Fredrickson for knowledge and input.

## 2020-09-16 NOTE — Telephone Encounter (Signed)
Pt c/o medication issue:  1. Name of Medication:  amLODipine (NORVASC) 5 MG tablet metoprolol tartrate (LOPRESSOR) 50 MG tablet isosorbide mononitrate (IMDUR) 30 MG 24 hr tablet rosuvastatin (CRESTOR) 10 MG tablet  2. How are you currently taking this medication (dosage and times per day)? As directed. Patient wanted to know if there are specific times of the day that would be better for him to take these medications.   3. Are you having a reaction (difficulty breathing--STAT)?   4. What is your medication issue? Patient states he has low BP in the morning. He is not sure if any changes need to be made to how he takes his medication.  He had a heart valve replaced 08/15/19 and has not had any issues since before his heart valve replacement

## 2020-09-17 NOTE — Telephone Encounter (Signed)
Let have him try taking his amlodipine at the afternoon or evening .  This may help with his increasing BP toward the evening     Pt advised and will start taking his Norvasc at noon based on his BP rising starting in the early afternoon hours. He does not take it consistently and will start today and will keep track of his BP readings and call in a few days to let us know how he is doing.

## 2020-09-22 ENCOUNTER — Other Ambulatory Visit: Payer: Self-pay

## 2020-09-22 ENCOUNTER — Ambulatory Visit
Admission: RE | Admit: 2020-09-22 | Discharge: 2020-09-22 | Disposition: A | Discharge status: Home / Self Care | Payer: Medicare Other | Source: Ambulatory Visit | Attending: Internal Medicine | Admitting: Internal Medicine

## 2020-09-22 DIAGNOSIS — Z4802 Encounter for removal of sutures: Secondary | ICD-10-CM

## 2020-09-22 NOTE — ED Triage Notes (Signed)
Pt presents to have 2 sutures removed from left ring finger.

## 2020-10-24 ENCOUNTER — Telehealth: Payer: Self-pay | Admitting: Cardiovascular Disease

## 2020-10-24 DIAGNOSIS — R0989 Other specified symptoms and signs involving the circulatory and respiratory systems: Secondary | ICD-10-CM

## 2020-10-24 NOTE — Telephone Encounter (Signed)
   Pt is requesting to speak with Dr. Elmarie Shiley nurse. He said he's been having multiple PVCs episode and it keeping him up all night. Also, to discuss about medications that he is having issue with

## 2020-10-24 NOTE — Telephone Encounter (Signed)
Returned call to Pt.  Pt states he has been having PVC's but his most distressing issue is his blood pressure.  He states in the AM he checks his blood pressure before he takes his medication.  He states his BP in the AM is often 108/50, so he only takes his metoprolol.  Then he checks his blood pressure frequently throughout the day waiting for his blood pressure to come down enough to take his amlodipine.  He states at bedtime he frequently cannot take all of his medication (metoprolol, losartan) d/t low blood pressure.  But then he states his blood pressure rises through the night-when he checks it at 2:00 am it is frequently as high as 208/98.  Advised Pt would discuss with Dr. Acie Fredrickson and get back to him.

## 2020-10-24 NOTE — Telephone Encounter (Signed)
Per Dr. Calvert Cantor to refer to HTN clinic.  Referral placed.  Pt aware.  Will have scheduling call to set up first available appt.

## 2020-10-25 ENCOUNTER — Other Ambulatory Visit: Payer: Self-pay | Admitting: Cardiovascular Disease

## 2020-11-04 NOTE — Progress Notes (Signed)
Patient ID: LADARION CHARON                 DOB: 08-06-1940                      MRN: [id number]    HPI: Alexis Griffith is a 80 y.o. male referred by Dr. Acie Fredrickson to HTN clinic. 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 Last seen in office by Dr. Acie Fredrickson on 08/02/20, BP was 132/64.   Patient called 10/24/20 reported multiple PVCs and concerns about his BP. BP in AM before medication is 108/50 so he only takes metoprolol. Waits for BP to come down enough to take his amlodipine. Cannot take all of his medication at bedtime (metoprolol, losartan) due to low BP. BP rises throughout night, frequently as high as 208/98 at 2am. Reported similar issue when he called 09/16/20. Also reported role of anxiety in BP.   Today, patient presents in good spirits accompanied by his wife. Reports that his BP is low in the mornings (123/48 today) and then starts to rise around 4-5 pm. He takes amlodipine in the middle/afternoon of the day when his BP has come up some, then he takes his evening metoprolol and losartan at night. So far today, he has only taken one dose of metoprolol of his BP medications. He takes all of his medications every day with no missed doses. He does not sleep well, waking up every 1-1.5 hours. If he feels off when he wakes up in the middle of the night, he will check BP then and SBP is commonly >200. This only happens at night, it is never that high during the day. He uses an Omron bicep cuff to monitor his BP. He checks it multiple times throughout the day when he feels off. He often feels dizzy or has a headache in the evening when his BP is high. He does not feel dizzy when BP is low/normal in the morning.   Reports that he knows BP rising in the  afternoon/evening is due to his anxiety getting worse throughout the day. Also reports it is likely related to being on less Klonopin than he used to be. Before his heart surgery he took 0.5 mg 4x daily. His PCP decreased him to 0.5 mg daily without a taper after his surgery. Patient reports trying Lexapro but didn't do well on it, trazodone made him too sleepy during the day, Buspar made him nervous. Reports that his daughter also struggles with her mental health and has started a medication recently and done really well but he doesn't know the name of it. Reports that the cause of his anxiety is likely related to his daughter's mental health and his own health. He also mentions that he recently stopped taking finasteride for BPH because he was having hot flashes, which resolved upon discontinuation. He is asking for what he should take instead. He used to take Cardura but this was switched to finasteride. Discussed that he should discuss that with his PCP or urologist who are managing his BPH.   Current HTN meds: amlodipine 5 mg daily, losartan 100 mg daily, metoprolol tartrate 50 mg BID Previously tried: valsartan (dizziness, fainted), losartan-HCTZ (switched to losartan alone due to low BP), metoprolol 100  mg BID (bradycardia) BP goal: <130/80 mmHg  Family History: Heart attack in his father and mother; Heart disease in his father and mother.   Social History: The patient reports that he quit smoking about 45 years ago. He has never used smokeless tobacco. He reports that he does not drink alcohol and does not use drugs.   Home BP readings: Omron bicep cuff  Wt Readings from Last 3 Encounters:  08/02/20 192 lb 3.2 oz (87.2 kg)  05/19/20 190 lb (86.2 kg)  05/07/20 189 lb 6.4 oz (85.9 kg)   BP Readings from Last 3 Encounters:  09/14/20 (!) 152/64  08/02/20 132/64  05/19/20 (!) 187/67   Pulse Readings from Last 3 Encounters:  09/14/20 60  08/02/20 (!) 54  05/19/20 60    Renal  function: CrCl cannot be calculated (Patient's most recent lab result is older than the maximum 21 days allowed.).  Past Medical History:  Diagnosis Date   Anxiety    Bladder neck obstruction 06/05/2013   CAD (coronary artery disease)    Carotid artery disease (HCC)    CTA 08/2019 of the head and neck show moderate 65 to 75% bilateral ICA stenoses    Chronic diastolic CHF (congestive heart failure) (HCC)    Diabetes mellitus type 2 in nonobese (HCC)    Excessive urination at night 06/05/2013   Giant cell arteritis ([place]) 05/17/2012   Hyperlipidemia    Hypertension    Hypertensive urgency    Hypomagnesemia 09/11/2019   Mild anemia 09/11/2019   NSTEMI (non-ST elevated myocardial infarction) ([place]) 09/09/2019   Orthostatic hypotension 09/11/2019   Premature atrial contractions 09/12/2019   Pulmonary nodules    PVC (premature ventricular contraction)    Renal mass    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 Burt Knack & Dr. Cyndia Bent   Severe aortic stenosis    Sinus bradycardia on ECG    Sleep apnea    Squamous cell carcinoma of tongue [facility]) September 2012   Followed by Dr. Wilburn Cornelia.   Stroke [facility])    TIA (transient ischemic attack)    Urinary retention 09/11/2019    Current Outpatient Medications on File Prior to Visit  Medication Sig Dispense Refill   acetaminophen (TYLENOL) 325 MG tablet Take 650 mg by mouth every 6 (six) hours as needed for mild pain.     amLODipine (NORVASC) 5 MG tablet Take 1 tablet (5 mg total) by mouth daily. 180 tablet 3   aspirin 81 MG chewable tablet Chew 1 tablet (81 mg total) by mouth daily. 360 tablet 0   clonazePAM (KLONOPIN) 0.5 MG tablet Take 0.5 mg by mouth daily.     FENOFIBRATE PO Take 135 mg by mouth daily.      glucose blood test strip 1 each by Other route as needed.      insulin glargine (LANTUS) 100 UNIT/ML injection Inject 60 Units into the skin daily.     isosorbide  mononitrate (IMDUR) 30 MG 24 hr tablet Take 1.5 tablets (45 mg total) by mouth daily. 120 tablet 3   Lancets (ONETOUCH DELICA PLUS 123XX123) MISC 1 each by Other route daily.      losartan (COZAAR) 100 MG tablet Take 1 tablet (100 mg total) by mouth daily. 90 tablet 3   magnesium oxide (MAG-OX) 400 MG tablet Take 400 mg by mouth daily.     metoprolol tartrate (LOPRESSOR) 50 MG tablet TAKE ONE TABLET BY  MOUTH TWICE DAILY 180 tablet 3   nitroGLYCERIN (NITROSTAT) 0.4 MG SL tablet PLACE 1 TABLET UNDER THE TONGUE EVERY 5 MINUTES AS NEEDED FOR CHEST PAIN UP TO 3 DOSES 25 tablet 7   ONE TOUCH ULTRA TEST test strip 1 each by Other route daily.      ranolazine (RANEXA) 500 MG 12 hr tablet TAKE 1 TABLET BY MOUTH TWICE DAILY. 180 tablet 3   rosuvastatin (CRESTOR) 10 MG tablet Take 1 tablet (10 mg total) by mouth daily. 90 tablet 3   sitaGLIPtin (JANUVIA) 100 MG tablet Take 100 mg by mouth daily.     No current facility-administered medications on file prior to visit.    Allergies  Allergen Reactions   Macrodantin [Nitrofurantoin] Other (See Comments)    "blocked kidneys"    Tape Other (See Comments)    "Tears my skin and leaves marks." PLEASE USE COBAN!!   Sulfa Antibiotics Rash   Lisinopril Cough   Nutritional Supplements Other (See Comments)    Protein drink given to me "did something"   Penicillins Other (See Comments)    08/30/19- Patient can't remember, was 35+ years ago. OK with trying cephalexin in hospital   Assessment/Plan:  1. Hypertension - BP in clinic today of 168/58 not at goal <130/80 mmHg. Clinic BP is similar to what BP is at this time of day per patient report. AM readings at home are at goal, however BP rises throughout the day. Suspect this is due to his anxiety, which the patient confirms he also suspects is the reason. Encouraged him to talk with his PCP about trying another chronic medication for anxiety to add on to Klonopin, like an SSRI. Patient states he would like to  find another PCP who may be able to help him more with his mental health. Given that BP is rising significantly throughout the day, will increase amlodipine to 10 mg daily, taken around 2-3 pm (~2 hours before BP starts to rise around 4-5pm). Continue metoprolol tartrate 50 mg BID and losartan 100 mg daily. Instructed him to call if he experiences any symptoms of low BP including dizziness or lightheadedness. Asked him to try to check BP less frequently throughout the day, as this also likely contributes to increased anxiety and BP. Asked him to bring his home BP cuff and log of BP readings to next visit. Follow up in 4 weeks with HTN clinic.   Rebbeca Paul, PharmD PGY2 Ambulatory Care Pharmacy Resident 11/05/2020 2:37 PM

## 2020-11-05 ENCOUNTER — Ambulatory Visit: Payer: Medicare Other | Admitting: Student-PharmD

## 2020-11-05 ENCOUNTER — Other Ambulatory Visit: Payer: Self-pay

## 2020-11-05 VITALS — BP 168/58 | HR 67

## 2020-11-05 DIAGNOSIS — R0989 Other specified symptoms and signs involving the circulatory and respiratory systems: Secondary | ICD-10-CM | POA: Diagnosis not present

## 2020-11-05 MED ORDER — AMLODIPINE BESYLATE 10 MG PO TABS: 10.0000 mg | ORAL_TABLET | Freq: Every day | ORAL | 3 refills | Status: DC

## 2020-11-05 NOTE — Patient Instructions (Addendum)
It was nice to see you today!  Your goal blood pressure is less than 130/80 mmHg. In clinic, your blood pressure was 168/58 mmHg.  Medication Changes: Increase amlodipine to 10 mg daily around 2pm  Continue losartan 100 mg daily at night, metoprolol tartrate 50 mg twice daily (morning and night)  Monitor blood pressure at home daily and keep a log (on your phone or piece of paper) to bring with you to your next visit. Write down date, time, blood pressure and pulse.  Keep up the good work with diet and exercise. Aim for a diet full of vegetables, fruit and lean meats (chicken, [place], fish). Try to limit salt intake by eating fresh or frozen vegetables (instead of canned), rinse canned vegetables prior to cooking and do not add any additional salt to meals.

## 2020-11-08 ENCOUNTER — Telehealth: Payer: Self-pay | Admitting: Cardiovascular Disease

## 2020-11-08 DIAGNOSIS — R0989 Other specified symptoms and signs involving the circulatory and respiratory systems: Secondary | ICD-10-CM

## 2020-11-08 MED ORDER — AMLODIPINE BESYLATE 10 MG PO TABS: 5.0000 mg | ORAL_TABLET | Freq: Every day | ORAL | 3 refills | Status: DC

## 2020-11-08 NOTE — Telephone Encounter (Signed)
Spoke with pt who reports his BP this morning is 120/48.  Pt states he has not taken any Amlodipine Wednesday, Thursday or today.  He is concerned that taking the increased dose of Amlodipine '10mg'$  will decrease his BP too much.  He reports he takes his BP multiple times throughout the day. Pt saw Christy Gentles, [facility] on 11/05/2020 and Amlodipine was to be increased to '10mg'$  daily.  Will forward information to [facility] and Dr Acie Fredrickson for further review and recommendation.

## 2020-11-08 NOTE — Telephone Encounter (Signed)
Spoke with pt and advised of Dr Elmarie Shiley recommendation as below:  Please ask him to take his BP only once a day  ( And never at 2 AM as noted in [place]' note)  OK to reduce Amlodipine dose to 5 mg a day .  Have him take his BP aprox 1 hours after he takes his meds.   Thanks   PN    Pt verbalizes understanding, agrees with current plan and thanked Therapist, sports for the phone call.

## 2020-11-08 NOTE — Telephone Encounter (Signed)
Pt c/o medication issue:  1. Name of Medication: amLODipine (NORVASC) 10 MG tablet  2. How are you currently taking this medication (dosage and times per day)? Take 1 tablet (10 mg total) by mouth daily.  3. Are you having a reaction (difficulty breathing--STAT)? No  4. What is your medication issue?  pt states that 10 mg amlodipine is possibly dropping his blood pressure, pt would like to discontinue the '10mg'$  and possibly go back to a lower dose, please advise

## 2020-11-25 ENCOUNTER — Telehealth: Payer: Self-pay | Admitting: Cardiovascular Disease

## 2020-11-25 NOTE — Telephone Encounter (Signed)
Pt c/o medication issue:  1. Name of Medication:  finasteride (PROSCAR) 5 MG tablet   2. How are you currently taking this medication (dosage and times per day)?  As prescribed by Urologist, 5 MG by mouth daily  3. Are you having a reaction (difficulty breathing--STAT)?  See below   4. What is your medication issue?   Patient states this medication has been causing cold sweats and hot flashes. He states he used to take Cardura and he is interested in going back on this medication, but he would like to know what Dr. Acie Fredrickson recommends. He states Cardura is a BP medication as well, so he is interested in discontinuing his amlodipine and taking Cardura in place of it.

## 2020-11-25 NOTE — Telephone Encounter (Signed)
Called patient about message. Patient would like to stop taking amlodipine and go back to taking Cardura. Patient stated his new Urologist tried to start him on Finasteride, but he cannot stand the side effects and his urologist will not prescribe him something else. Patient stated that he use to take Cardura and it helps with prostate and BP, so he would like to just take it for both. Patient stated that he has not taken Finasteride for several weeks, due to the side effects. Will forward to Dr. Acie Fredrickson for advisement.

## 2020-11-27 MED ORDER — DOXAZOSIN MESYLATE 4 MG PO TABS: 4.0000 mg | ORAL_TABLET | Freq: Every day | ORAL | 3 refills | Status: DC

## 2020-11-27 NOTE — Telephone Encounter (Signed)
Called patient with Dr. Elmarie Shiley response. " Okay if he wants to stop amlodipine and restart Cardura. Restart at previous dose, if unknown, start at 4 mg PO QHS." Patient verbalized understanding and will stop amlodipine and start Cardura later today.

## 2020-12-03 ENCOUNTER — Ambulatory Visit: Payer: Medicare Other

## 2020-12-06 ENCOUNTER — Telehealth: Payer: Self-pay | Admitting: Cardiovascular Disease

## 2020-12-06 NOTE — Telephone Encounter (Signed)
Returned call to patient who states he is having low BP and feeling fatigued. I verified his medication list and the timing of each medication. He admits to anxiety and that he gets focused on the diastolic reading which is consistently low. Current BP 137/59, HR 59. Patient had office visit with Pharm D with our hypertension clinic on 9/6 and was advised to take losartan 100 mg at night instead of in the morning. I spent >25 minutes talking with the patient to help him figure out a schedule for his medications that he can keep up with. He is very focused on his low diastolic BP which has been consistently low.  I advised him to take metoprolol and Ranexa twice daily, take Cardura and Imdura in the morning and to take Losartan in the evening. I advised him to only check BP 1-2 hours after his meds unless he is feeling lightheaded or pre-syncope. He has an appointment in hypertension clinic on 10/11. I advised him to bring his home BP cuff when he comes in for his appointment. He verbalized understanding and agreement and thanked me for the call.

## 2020-12-06 NOTE — Telephone Encounter (Signed)
Pt c/o BP issue: STAT if pt c/o blurred vision, one-sided weakness or slurred speech  1. What are your last 5 BP readings? 123/45 P 65 PT DOES NOT HAVE ANY OTHER BP READINGS, HE IS SUPPOSED TO BE TAKING HIS BP DAILY BUT HE ALWAYS FORGETS  2. Are you having any other symptoms (ex. Dizziness, headache, blurred vision, passed out)?  NONE OF THE ABOVE   3. What is your BP issue? PT STATES THIS BP IS TOO LOW FOR HIM

## 2020-12-09 NOTE — Progress Notes (Signed)
Patient ID: Alexis Griffith                 DOB: [birth year]                      MRN: [id number]    HPI: Alexis Griffith is a 80 y.o. male referred by Dr. Acie Fredrickson to HTN clinic. 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   Patient with labile HTN. Last seen in office by Dr. Acie Fredrickson on 08/02/20, BP was 132/64. When last seen in HTN clinic on 11/05/20, BP was 168/58. Amlodipine was increased to 10 mg daily. Anxiety plays a role in elevated BP. Since that visit, he has called multiple times - first with concern of low BP (120/48) and amlodipine was decreased back to 5 mg daily. Then called with request to switch amlodipine to doxazosin and was started on 4 mg. Then called saying he was having low BP and fatigue, home BP at that time 137/59.   Today, patient arrives in good spirits accompanied by his wife. He reports that he had a panic attack last night and his BP was as high as 220/87. He notes that he had wanted to stop amlodipine and finasteride and switch instead to doxazosin because he felt like these medications were contributing to his "hot flashes." However, these hot flashes have still been occurring since stopping both medications, and he admits they only happen during his panic attacks and does not experience them otherwise. He denies dizziness, lightheadedness, falls, blurred vision. He is taking doxazosin in the morning but denies orthostatic hypotension. He says he takes great care when going from sitting to standing to make sure he is stable on his feet. He likely checks his BP at home upwards of 100 times per day. I checked the memory on his machine and it was nearly 50 readings until I saw the number from last night during his panic attack. He did  not realize you could see past readings so I taught him how to do this, but he does admit to checking BP nearly constantly during the day and multiple times one after the other until it comes down. After sorting through his BP log it appears the average at home tends to be 160s/60-70s, with some readings as low as 130s/60s though he notes he has had some diastolic readings in the high 40s to mid 50s though this is very rare. He does not feel dizzy or lightheaded when he sees these.   Home cuff: 179/76, HR 72 (patient was talking), recheck 160/72, HR 70 (no talking) Clinic measurement: 166/52, HR 74 (a few minutes later)  Current HTN meds: losartan 100 mg daily, metoprolol tartrate 50 mg BID, doxazosin 4 mg daily (AM) Previously tried: valsartan (dizziness, fainted), losartan-HCTZ (switched to losartan alone due to low BP), metoprolol 100 mg BID (bradycardia), amlodipine 5 mg (pt requested to switch to doxazosin) BP goal: <130/80 mmHg  Family History: Heart attack in his father and mother; Heart disease in his father and mother.   Social History: The patient reports that he quit smoking about 45 years ago. He has never used smokeless tobacco. He reports that he does not drink alcohol and does not use drugs.   Home BP readings: Omron  bicep cuff 136/62, 135/53, 145/61, 155/63, 160/69, 165/70, 163/69, 161/69, 166/70, 171/71, 176/73, 171/74, 181/76, 183/76, 184/80, 208/91, 220/87 (has more than 50 readings just from this morning to last night)  Wt Readings from Last 3 Encounters:  08/02/20 192 lb 3.2 oz (87.2 kg)  05/19/20 190 lb (86.2 kg)  05/07/20 189 lb 6.4 oz (85.9 kg)   BP Readings from Last 3 Encounters:  11/05/20 (!) 168/58  09/14/20 (!) 152/64  08/02/20 132/64   Pulse Readings from Last 3 Encounters:  11/05/20 67  09/14/20 60  08/02/20 (!) 54    Renal function: CrCl cannot be calculated (Patient's most recent lab result is older than the maximum 21 days allowed.).  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 Burt Knack & Dr. Cyndia Bent   Severe aortic stenosis    Sinus bradycardia on ECG    Sleep apnea    Squamous cell carcinoma of tongue [facility]) September 2012   Followed by Dr. Wilburn Cornelia.   Stroke [facility])    TIA (transient ischemic attack)    Urinary retention 09/11/2019    Current Outpatient Medications on File Prior to Visit  Medication Sig Dispense Refill   acetaminophen (TYLENOL) 325 MG tablet Take 650 mg by mouth every 6 (six) hours as needed for mild pain.     aspirin 81 MG chewable tablet Chew 1 tablet (81 mg total) by mouth daily. 360 tablet 0   clonazePAM (KLONOPIN) 0.5 MG tablet Take 0.5 mg by mouth daily.     doxazosin (CARDURA) 4 MG tablet Take 1 tablet (4 mg total) by mouth daily. 90 tablet 3   FENOFIBRATE PO Take 135 mg by mouth daily.      glucose blood test strip 1 each by Other route as needed.      insulin glargine (LANTUS) 100 UNIT/ML injection Inject 60 Units into the skin daily.     isosorbide mononitrate (IMDUR) 30 MG 24 hr tablet Take 1.5 tablets (45 mg total) by mouth daily. 120 tablet 3   Lancets (ONETOUCH DELICA PLUS [id number])  MISC 1 each by Other route daily.      losartan (COZAAR) 100 MG tablet Take 1 tablet (100 mg total) by mouth daily. 90 tablet 3   magnesium oxide (MAG-OX) 400 MG tablet Take 400 mg by mouth daily.     metoprolol tartrate (LOPRESSOR) 50 MG tablet TAKE ONE TABLET BY MOUTH TWICE DAILY 180 tablet 3   nitroGLYCERIN (NITROSTAT) 0.4 MG SL tablet PLACE 1 TABLET UNDER THE TONGUE EVERY 5 MINUTES AS NEEDED FOR CHEST PAIN UP TO 3 DOSES 25 tablet 7  ONE TOUCH ULTRA TEST test strip 1 each by Other route daily.      ranolazine (RANEXA) 500 MG 12 hr tablet TAKE 1 TABLET BY MOUTH TWICE DAILY. 180 tablet 3   rosuvastatin (CRESTOR) 10 MG tablet Take 1 tablet (10 mg total) by mouth daily. 90 tablet 3   sitaGLIPtin (JANUVIA) 100 MG tablet Take 100 mg by mouth daily.     No current facility-administered medications on file prior to visit.    Allergies  Allergen Reactions   Macrodantin [Nitrofurantoin] Other (See Comments)    "blocked kidneys"    Tape Other (See Comments)    "Tears my skin and leaves marks." PLEASE USE COBAN!!   Sulfa Antibiotics Rash   Lisinopril Cough   Nutritional Supplements Other (See Comments)    Protein drink given to me "did something"   Penicillins Other (See Comments)    08/30/19- Patient can't remember, was 35+ years ago. OK with trying cephalexin in hospital   Assessment/Plan:  1. Hypertension - BP in clinic today of 166/52 is not at goal <130/80 mmHg. Given age of 26 and occasional low diastolic reading, would be okay with SBP goal <140. Discussed that his BP is closely tied to his anxiety and that checking his BP so many times throughout the day is not good for him and likely only drives BP up higher which he agrees with. I recommended that he discuss his mental health with a PCP and consider trying a new medication for anxiety like an SSRI, and I encouraged him to speak with a psychiatrist or therapist about his anxiety which he agrees he needs to do and would help him. Even  though he has one or two readings that have borderline low diastolic readings, he does not experience any symptoms of low BP with these and in looking through his home BP logs, his average BP for most of the day is in the 160s-60s-70s which is similar to our office reading and requires additional lowering. He agrees that his hot flashes do not appear to be related to finasteride or amlodipine since they continued after stopping these medications but instead are related to panic attacks since it only happens when he is having one. He is amenable to resuming amlodipine that we will restart at a low dose of 2.5 mg daily. He will continue losartan 100 mg daily, metoprolol tartrate 50 mg BID, and doxazosin 4 mg daily. I asked that he take the doxazosin in the evening instead of the morning to minimize risk of orthostatic hypotension. He made a deal with me that he will not check his BP more than five times per day between now and when I see him next (ideally this would only be once daily which we will work toward, but given that he is checking it 60-100+ times per day this is a substantial decrease). I also told him not to check his BP during a panic attack because he already knows it is going to be high and seeing the number likely adds to his anxiety in that moment. He is agreeable to these changes and confirms understanding. He will call if he experiences any symptoms of low BP until next visit a month from now.   Rebbeca Paul, PharmD PGY2 Ambulatory Care Pharmacy Resident 12/09/2020 3:24 PM

## 2020-12-10 ENCOUNTER — Ambulatory Visit: Payer: Medicare Other | Admitting: Student-PharmD

## 2020-12-10 ENCOUNTER — Other Ambulatory Visit: Payer: Self-pay

## 2020-12-10 VITALS — BP 166/52 | HR 74

## 2020-12-10 DIAGNOSIS — R0989 Other specified symptoms and signs involving the circulatory and respiratory systems: Secondary | ICD-10-CM | POA: Diagnosis not present

## 2020-12-10 MED ORDER — AMLODIPINE BESYLATE 2.5 MG PO TABS: 2.5000 mg | ORAL_TABLET | Freq: Every day | ORAL | 11 refills | Status: DC

## 2020-12-10 NOTE — Patient Instructions (Addendum)
It was nice to see you today!  Your goal blood pressure is less than 130/80 mmHg. In clinic, your blood pressure was 166/52 mmHg.  Medication Changes: Begin amlodipine 2.5 mg daily (morning)  Continue losartan 100 mg daily (during the day), metoprolol tartrate 50 mg BID, doxazosin 4 mg daily (nighttime)  Please decrease how many times you check your BP in a day.   Monitor blood pressure at home daily and keep a log (on your phone or piece of paper) to bring with you to your next visit. Write down date, time, blood pressure and pulse.  Keep up the good work with diet and exercise. Aim for a diet full of vegetables, fruit and lean meats (chicken, [place], fish). Try to limit salt intake by eating fresh or frozen vegetables (instead of canned), rinse canned vegetables prior to cooking and do not add any additional salt to meals.

## 2020-12-27 ENCOUNTER — Other Ambulatory Visit: Payer: Self-pay | Admitting: Cardiovascular Disease

## 2021-01-06 NOTE — Progress Notes (Signed)
Patient ID: Alexis Griffith                 DOB: 10-Aug-1940                      MRN: [id number]    HPI: Alexis Griffith is a 80 y.o. male referred by Dr. Acie Fredrickson to HTN clinic. 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   Patient with labile HTN. Last seen in office by Dr. Acie Fredrickson on 08/02/20, BP was 132/64. When initially seen in HTN clinic on 11/05/20, BP was 168/58. Amlodipine was increased to 10 mg daily. Anxiety plays a role in elevated BP. Since that visit, he has called multiple times - first with concern of low BP (120/48) and amlodipine was decreased back to 5 mg daily. Then called with request to switch amlodipine to doxazosin due to hot flashes with amlodipine and was started on 4 mg. At last visit on 12/10/20, patient reported no improvement in hot flashes since stopping amlodipine but it was discovered that these only happen during panic attacks so unlikely to be medication related. Patient brought in home cuff which read accurately to clinic cuff but discovered in checking the machine memory that he checks his BP 60-100+ times per day. BP was elevated in clinic and at home so added amlodipine back at low dose and patient agreed to not checking BP more than 5 times per day with ultimate goal of decreasing to once daily.   Today, patient arrives in good spirits accompanied by his wife. He denies dizziness, lightheadedness, falls, blurred vision. Reports that he is still not sleeping well but he reports having less panic attacks recently. He reports that he stopped taking amlodipine after the last visit. He denies any adverse effects of this but it appears he got confused and thought he was not supposed to be taking this. Prior to last visit he  switched amlodipine to doxazosin but at last visit BP was still elevated so we resumed amlodipine at a lower dose. He forgot this and thought he still wasn't supposed to be taking it. We also discussed switching the timing of his doxazosin dose to the evening to prevent orthostatic hypotension during the day but he also forgot this and has been taking it in the morning. He reports he has been checking his BP less throughout the day than before but says he still checks it 15-20 times per day.   Current HTN meds: losartan 100 mg daily, metoprolol tartrate 50 mg BID, doxazosin 4 mg daily (AM), amlodipine 2.5 mg daily (not taking) Previously tried: valsartan (dizziness, fainted), losartan-HCTZ (switched to losartan alone due to low BP), metoprolol 100 mg BID (bradycardia), amlodipine 5 mg (pt requested to switch to doxazosin due to hot flashes that did not resolve upon discontinuation, they are due to panic attacks) BP goal: <130/80 mmHg  Family History: Heart attack in his father and mother; Heart disease in his father and mother.   Social History: The patient reports that he quit smoking about 45 years ago. He has never used smokeless tobacco. He reports that he does not drink alcohol and does not use drugs.   Home BP readings: Omron bicep cuff (has been validated to read similarly to clinic  cuff at 12/10/20 visit). He reports checking BP less throughout the day though in clicking through the memory on his device it appears he is still checking it >50 times per day (which does seem to be an improvement from before). Most readings are 140-150s/60-70s but do vary. Recent readings: 161/67, 130/56, 143/56, 159/71, 175/82, 195/89, 144/65  Wt Readings from Last 3 Encounters:  08/02/20 192 lb 3.2 oz (87.2 kg)  05/19/20 190 lb (86.2 kg)  05/07/20 189 lb 6.4 oz (85.9 kg)   BP Readings from Last 3 Encounters:  01/07/21 (!) 168/62  12/10/20 (!) 166/52  11/05/20 (!) 168/58   Pulse Readings from Last 3  Encounters:  01/07/21 (!) 58  12/10/20 74  11/05/20 67    Renal function: CrCl cannot be calculated (Patient's most recent lab result is older than the maximum 21 days allowed.).  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 Burt Knack & Dr. Cyndia Bent   Severe aortic stenosis    Sinus bradycardia on ECG    Sleep apnea    Squamous cell carcinoma of tongue [facility] September 2012   Followed by Dr. Wilburn Cornelia.   Stroke [facility])    TIA (transient ischemic attack)    Urinary retention 09/11/2019    Current Outpatient Medications on File Prior to Visit  Medication Sig Dispense Refill   acetaminophen (TYLENOL) 325 MG tablet Take 650 mg by mouth every 6 (six) hours as needed for mild pain.     amLODipine (NORVASC) 2.5 MG tablet Take 1 tablet (2.5 mg total) by mouth daily. 30 tablet 11   aspirin 81 MG chewable tablet Chew 1 tablet (81 mg total) by mouth daily. 360 tablet 0   clonazePAM (KLONOPIN) 0.5 MG tablet Take 0.5 mg by mouth daily.     doxazosin (CARDURA) 4 MG tablet Take 1 tablet (4 mg total) by mouth daily. 90 tablet 3   FENOFIBRATE PO Take 135 mg by mouth daily.      glucose  blood test strip 1 each by Other route as needed.      insulin glargine (LANTUS) 100 UNIT/ML injection Inject 60 Units into the skin daily.     isosorbide mononitrate (IMDUR) 30 MG 24 hr tablet Take 1.5 tablets (45 mg total) by mouth daily. 120 tablet 3   Lancets (ONETOUCH DELICA PLUS [id number]) MISC 1 each by Other route daily.      losartan (COZAAR) 100 MG tablet Take 1 tablet (100 mg total) by mouth daily. 90 tablet 3   magnesium oxide (MAG-OX) 400 MG tablet Take 400 mg by mouth daily.  metoprolol tartrate (LOPRESSOR) 50 MG tablet TAKE ONE TABLET BY MOUTH TWICE DAILY 180 tablet 3   nitroGLYCERIN (NITROSTAT) 0.4 MG SL tablet PLACE 1 TABLET UNDER THE TONGUE EVERY 5 MINUTES AS NEEDED FOR CHEST PAIN UP TO 3 DOSES 25 tablet 7   ONE TOUCH ULTRA TEST test strip 1 each by Other route daily.      ranolazine (RANEXA) 500 MG 12 hr tablet TAKE 1 TABLET BY MOUTH TWICE DAILY. 180 tablet 3   rosuvastatin (CRESTOR) 10 MG tablet TAKE 1 TABLET ONCE DAILY. 90 tablet 3   sitaGLIPtin (JANUVIA) 100 MG tablet Take 100 mg by mouth daily.     No current facility-administered medications on file prior to visit.    Allergies  Allergen Reactions   Macrodantin [Nitrofurantoin] Other (See Comments)    "blocked kidneys"    Tape Other (See Comments)    "Tears my skin and leaves marks." PLEASE USE COBAN!!   Sulfa Antibiotics Rash   Lisinopril Cough   Nutritional Supplements Other (See Comments)    Protein drink given to me "did something"   Penicillins Other (See Comments)    08/30/19- Patient can't remember, was 35+ years ago. OK with trying cephalexin in hospital   Assessment/Plan:  1. Hypertension - BP in clinic today of 168/62 is not at goal <130/80 mmHg. Given age of 61 and occasional low diastolic reading, would be okay with SBP goal <140. BP is largely impacted by his anxiety. Frequency of significantly elevated BP readings have improved likely due to the decreased frequency of panic attacks since last  visit but all home readings are still elevated. Due to confusion he has not been taking amlodipine 2.5 mg daily as discussed at last visit. Will resume this today which he is agreeable to given BP is still elevated. Continue losartan 100 mg daily, metoprolol tartrate 50 mg BID, doxazosin 4 mg daily but switch doxazosin dosing to the evening. Instructed him to continue monitoring his BP daily but try to continue working on decreasing the frequency. He has a visit with his PCP coming up in December - encouraged him to discuss his sleep and anxiety with them at that time. F/u in 1 month for HTN.   Rebbeca Paul, PharmD PGY2 Ambulatory Care Pharmacy Resident 01/07/2021 2:02 PM

## 2021-01-07 ENCOUNTER — Ambulatory Visit: Payer: Medicare Other | Admitting: Student-PharmD

## 2021-01-07 ENCOUNTER — Other Ambulatory Visit: Payer: Self-pay

## 2021-01-07 VITALS — BP 168/62 | HR 58

## 2021-01-07 DIAGNOSIS — R0989 Other specified symptoms and signs involving the circulatory and respiratory systems: Secondary | ICD-10-CM | POA: Diagnosis not present

## 2021-01-07 NOTE — Patient Instructions (Signed)
It was nice to see you today!  Your goal blood pressure is less than 130/80 mmHg. In clinic, your blood pressure was 168/62 mmHg.  Medication Changes: Begin amlodipine 2.5 mg daily  Continue losartan 100 mg daily, metoprolol tartrate 50 mg twice daily, doxazosin 4 mg daily in the evening  Monitor blood pressure at home daily and keep a log (on your phone or piece of paper) to bring with you to your next visit. Write down date, time, blood pressure and pulse.  Keep up the good work with diet and exercise. Aim for a diet full of vegetables, fruit and lean meats (chicken, [place], fish). Try to limit salt intake by eating fresh or frozen vegetables (instead of canned), rinse canned vegetables prior to cooking and do not add any additional salt to meals.

## 2021-02-03 ENCOUNTER — Other Ambulatory Visit: Payer: Self-pay | Admitting: Cardiovascular Disease

## 2021-02-06 ENCOUNTER — Other Ambulatory Visit: Payer: Self-pay | Admitting: Cardiovascular Disease

## 2021-02-10 NOTE — Progress Notes (Signed)
Patient ID: Alexis Griffith                 DOB: 10/23/1940                      MRN: [id number]    HPI: Alexis Griffith is a 80 y.o. male referred by Dr. Acie Fredrickson to HTN clinic. 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   Patient with labile HTN. Has been seen multiple times by HTN clinic. BP in clinic has remained elevated but reviewing home machine reveals BP slowly starting to improve. Anxiety plays a role in elevated BP. At one point he wanted to stop amlodipine because he felt it contributed to hot flashes but these continued even after discontinuation and he realized it was more related to his anxiety so amlodipine has since been resumed. He sometimes adjusts his medications on his own based on the readings he sees. At last visit 11/8, amlodipine was resumed and he was counseled to move doxazosin dosing to nighttime.   Today, patient arrives in good spirits accompanied by his wife. He denies dizziness, lightheadedness, falls, blurred vision. Reports that he was taking amlodipine more sporadically at first but has taken it daily for about the past week. He is seeing some improvement in his BP and he reports checking it less frequently throughout the day - only 5-10 times (previously 60-100 times/day). He also doesn't check it anymore when he feels anxious. He still reports having nightmares and hot flashes that he feels are likely anxiety related. He sees his PCP later this month.   Current HTN meds: losartan 100 mg daily, metoprolol tartrate 50 mg BID, doxazosin 4 mg daily, amlodipine 2.5 mg daily Previously tried: valsartan (dizziness, fainted), losartan-HCTZ (switched to losartan alone due to low BP), metoprolol 100 mg BID (bradycardia),  amlodipine 5 mg (pt requested to switch to doxazosin due to hot flashes that did not resolve upon discontinuation, they are due to panic attacks) BP goal: <130/80 mmHg  Family History: Heart attack in his father and mother; Heart disease in his father and mother.   Social History: The patient reports that he quit smoking about 45 years ago. He has never used smokeless tobacco. He reports that he does not drink alcohol and does not use drugs.   Home BP readings: Omron bicep cuff (has been validated to read similarly to clinic cuff at 12/10/20 visit). He reports checking BP less throughout the day. Most readings are 130-150s/60-70s. Has only a couple systolic readings above 127 which is an improvement from before. Highest reading seen 171/76.  Recent readings: 147/56, 151/56, 158/49, 165/75, 171/76, 145/67, 139/64, 135/67, 135/63  Wt Readings from Last 3 Encounters:  08/02/20 192 lb 3.2 oz (87.2 kg)  05/19/20 190 lb (86.2 kg)  05/07/20 189 lb 6.4 oz (85.9 kg)   BP Readings from Last 3 Encounters:  01/07/21 (!) 168/62  12/10/20 (!) 166/52  11/05/20 (!) 168/58   Pulse Readings from Last 3 Encounters:  01/07/21 (!) 58  12/10/20 74  11/05/20 67    Renal function: CrCl cannot be calculated (Patient's most recent lab result is older than the maximum 21 days allowed.).  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 Burt Knack & Dr. Cyndia Bent   Severe aortic stenosis    Sinus bradycardia on ECG    Sleep apnea    Squamous cell carcinoma of tongue [facility]) September 2012   Followed by Dr. Wilburn Cornelia.   Stroke [facility])    TIA (transient ischemic attack)    Urinary retention 09/11/2019    Current Outpatient Medications on File Prior to Visit  Medication Sig Dispense Refill   acetaminophen (TYLENOL) 325 MG tablet Take 650 mg by mouth every 6 (six) hours as needed for mild pain.     amLODipine (NORVASC) 2.5 MG tablet Take 1 tablet (2.5 mg total) by mouth daily. 30 tablet 11   aspirin 81 MG chewable tablet Chew 1 tablet (81 mg total) by mouth daily. 360 tablet 0   clonazePAM (KLONOPIN) 0.5 MG tablet Take 0.5 mg by mouth daily.     doxazosin (CARDURA) 4 MG tablet Take 1 tablet (4 mg total) by mouth daily. 90 tablet 3   FENOFIBRATE PO Take 135 mg by mouth daily.      glucose blood test strip 1 each by Other route as needed.      insulin glargine (LANTUS) 100 UNIT/ML injection Inject 60 Units into the skin daily.     isosorbide mononitrate (IMDUR) 30 MG 24 hr tablet Take 1.5 tablets (45 mg total) by mouth daily. 120 tablet 3   Lancets (ONETOUCH DELICA PLUS [id number]) MISC 1 each by Other route daily.      losartan (COZAAR) 100 MG tablet TAKE 1 TABLET ONCE DAILY. 90 tablet 3   magnesium oxide (MAG-OX) 400 MG tablet Take 400 mg by mouth daily.     metoprolol tartrate (LOPRESSOR) 50 MG tablet TAKE ONE TABLET BY MOUTH TWICE DAILY 180 tablet 3   nitroGLYCERIN (NITROSTAT) 0.4 MG SL tablet PLACE 1 TABLET UNDER THE TONGUE EVERY 5 MINUTES AS NEEDED FOR CHEST PAIN UP TO 3 DOSES 25 tablet 7   ONE TOUCH ULTRA TEST test strip 1 each by Other route daily.      ranolazine (RANEXA) 500 MG  12 hr tablet TAKE 1 TABLET BY MOUTH TWICE DAILY. 180 tablet 1   rosuvastatin (CRESTOR) 10 MG tablet TAKE 1 TABLET ONCE DAILY. 90 tablet 3   sitaGLIPtin (JANUVIA) 100 MG tablet Take 100 mg by mouth daily.     No current facility-administered medications on file prior to visit.    Allergies  Allergen Reactions   Macrodantin [Nitrofurantoin] Other (See Comments)    "blocked kidneys"    Tape Other (See Comments)    "Tears my skin and leaves marks." PLEASE USE COBAN!!   Sulfa  Antibiotics Rash   Lisinopril Cough   Nutritional Supplements Other (See Comments)    Protein drink given to me "did something"   Penicillins Other (See Comments)    08/30/19- Patient can't remember, was 35+ years ago. OK with trying cephalexin in hospital   Assessment/Plan:  1. Hypertension - BP in clinic today of 160/60 is not at goal <130/80 mmHg. Given age of 47 and occasional low diastolic reading, would be okay with SBP goal <140 mmHg. BP is largely impacted by his anxiety. Home BP readings have improved from last visit. He has only been taking amlodipine consistently for about the last week (previously taking about every other day). He is agreeable to continuing to take amlodipine 2.5 mg daily for the next week and if he continues to consistently see systolic readings of [id number], he will increase amlodipine to 5 mg daily until he sees me next. He has plenty of amlodipine 5 mg tablets at home from a previous prescription. Continue losartan 100 mg daily, metoprolol tartrate 50 mg BID, doxazosin 4 mg daily but try to switch doxazosin dosing to the evening.  Instructed him to continue monitoring his BP daily and continue working on decreasing the frequency. Encouraged him to discuss anxiety/nightmares with his PCP this month. F/u in 4 weeks with HTN clinic.   Rebbeca Paul, PharmD PGY2 Ambulatory Care Pharmacy Resident 02/10/2021 12:34 PM

## 2021-02-11 ENCOUNTER — Ambulatory Visit: Payer: Medicare Other | Admitting: Student-PharmD

## 2021-02-11 ENCOUNTER — Other Ambulatory Visit: Payer: Self-pay

## 2021-02-11 VITALS — BP 160/60 | HR 68

## 2021-02-11 DIAGNOSIS — I10 Essential (primary) hypertension: Secondary | ICD-10-CM

## 2021-02-11 NOTE — Patient Instructions (Signed)
It was nice to see you today!  Your goal blood pressure is less than 140/80 mmHg. In clinic, your blood pressure was 160/60 mmHg.  Medication Changes:  Continue amlodipine 2.5 mg daily for 1 week. If you are seeing more 140-150s than 130s, increase to 5 mg daily until you see me again.   Continue losartan 100 mg daily, metoprolol tartrate 50 mg BID, doxazosin 4 mg daily.   Monitor blood pressure at home daily and keep a log (on your phone or piece of paper) to bring with you to your next visit. Write down date, time, blood pressure and pulse.  Keep up the good work with diet and exercise. Aim for a diet full of vegetables, fruit and lean meats (chicken, [place], fish). Try to limit salt intake by eating fresh or frozen vegetables (instead of canned), rinse canned vegetables prior to cooking and do not add any additional salt to meals.

## 2021-03-10 NOTE — Progress Notes (Signed)
Patient ID: Alexis Griffith                 DOB: 1940/08/24                      MRN: [id number]    HPI: Alexis Griffith is a 81 y.o. male referred by Dr. Acie Fredrickson to HTN clinic. 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   Patient with labile HTN. Has been seen multiple times by HTN clinic. BP in clinic has remained elevated but reviewing home machine reveals BP slowly starting to improve. Anxiety plays a role in elevated BP. At one point he wanted to stop amlodipine because he felt it contributed to hot flashes but these continued even after discontinuation and he realized it was more related to his anxiety so amlodipine has since been resumed. He sometimes adjusts his medications on his own based on the readings he sees. At last visit 02/27/21, patient was not very adherent to amlodipine 2.5 mg so this was continued with patient instructed to increase to 5 mg if BP remained elevated at home even after improved adherence.   Today, patient arrives in good spirits accompanied by his wife. He denies dizziness, lightheadedness, falls, blurred vision. Reports that he is now taking amlodipine 5 mg daily and he thinks his BP has improved on this. He forgot to bring his cuff today because he had an appointment before this one but provides readings from recall which are improved from last visit. His daughter (who also has anxiety and nightmares) was recently started on clonidine for nightmares by her psychiatrist. He asks if this is something he can start for his nightmares. His panic attacks have been getting less frequent over the last few months but he did have one last night. He is down to only checking his BP less than 5 times per day (down from nearly 100  times per day when I first started seeing him). He notices when he walks his BP tends to be at goal.   Current HTN meds: losartan 100 mg daily, metoprolol tartrate 50 mg BID, doxazosin 4 mg daily, amlodipine 5 mg daily  Previously tried: valsartan (dizziness, fainted), losartan-HCTZ (switched to losartan alone due to low BP), metoprolol 100 mg BID (bradycardia), amlodipine 5 mg (pt requested to switch to doxazosin due to hot flashes that did not resolve upon discontinuation, they are due to panic attacks, resumed it without issue)  BP goal: <130/80 mmHg  Family History: Heart attack in his father and mother; Heart disease in his father and mother.   Social History: The patient reports that he quit smoking about 45 years ago. He has never used smokeless tobacco. He reports that he does not drink alcohol and does not use drugs.   Home BP readings: Omron bicep cuff (has been validated to read similarly to clinic cuff at 12/10/20 visit). He reports checking BP less throughout the day (<5 times per day now). Most readings are 130s-140s/60. Highest systolic reading 671 last night. Lowest reading 121/55 this morning.   Wt Readings from Last 3 Encounters:  08/02/20 192 lb 3.2 oz (87.2 kg)  05/19/20 190 lb (86.2 kg)  05/07/20 189 lb 6.4 oz (85.9 kg)   BP Readings from Last  3 Encounters:  03/11/21 128/60  02/11/21 (!) 160/60  01/07/21 (!) 168/62   Pulse Readings from Last 3 Encounters:  03/11/21 64  02/11/21 68  01/07/21 (!) 58    Renal function: CrCl cannot be calculated (Patient's most recent lab result is older than the maximum 21 days allowed.).  Past Medical History:  Diagnosis Date   Anxiety    Bladder neck obstruction 06/05/2013   CAD (coronary artery disease)    Carotid artery disease (HCC)    CTA 08/2019 of the head and neck show moderate 65 to 75% bilateral ICA stenoses    Chronic diastolic CHF (congestive heart failure) (HCC)    Diabetes mellitus type 2 in nonobese (HCC)     Excessive urination at night 06/05/2013   Giant cell arteritis ([place]) 05/17/2012   Hyperlipidemia    Hypertension    Hypertensive urgency    Hypomagnesemia 09/11/2019   Mild anemia 09/11/2019   NSTEMI (non-ST elevated myocardial infarction) ([place]) 09/09/2019   Orthostatic hypotension 09/11/2019   Premature atrial contractions 09/12/2019   Pulmonary nodules    PVC (premature ventricular contraction)    Renal mass    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 Burt Knack & Dr. Cyndia Bent   Severe aortic stenosis    Sinus bradycardia on ECG    Sleep apnea    Squamous cell carcinoma of tongue [facility]) September 2012   Followed by Dr. Wilburn Cornelia.   Stroke [facility])    TIA (transient ischemic attack)    Urinary retention 09/11/2019    Current Outpatient Medications on File Prior to Visit  Medication Sig Dispense Refill   amLODipine (NORVASC) 5 MG tablet Take 5 mg by mouth daily.     acetaminophen (TYLENOL) 325 MG tablet Take 650 mg by mouth every 6 (six) hours as needed for mild pain.     aspirin 81 MG chewable tablet Chew 1 tablet (81 mg total) by mouth daily. 360 tablet 0   clonazePAM (KLONOPIN) 0.5 MG tablet Take 0.5 mg by mouth daily.     doxazosin (CARDURA) 4 MG tablet Take 1 tablet (4 mg total) by mouth daily. 90 tablet 3   FENOFIBRATE PO Take 135 mg by mouth daily.      glucose blood test strip 1 each by Other route as needed.      insulin glargine (LANTUS) 100 UNIT/ML injection Inject 60 Units into the skin daily.     isosorbide mononitrate (IMDUR) 30 MG 24 hr tablet Take 1.5 tablets (45 mg total) by mouth daily. 120 tablet 3   Lancets (ONETOUCH DELICA PLUS [id number]) MISC 1 each by Other route daily.      losartan (COZAAR) 100 MG tablet TAKE 1 TABLET ONCE DAILY. 90 tablet 3   magnesium oxide (MAG-OX) 400 MG tablet Take 400 mg by mouth daily.     metoprolol tartrate (LOPRESSOR) 50 MG tablet TAKE ONE TABLET BY MOUTH TWICE DAILY  180 tablet 3   nitroGLYCERIN (NITROSTAT) 0.4 MG SL tablet PLACE 1 TABLET UNDER THE TONGUE EVERY 5 MINUTES AS NEEDED FOR CHEST PAIN UP TO 3 DOSES 25 tablet 7   ONE TOUCH ULTRA TEST test strip 1 each by Other route daily.      ranolazine (RANEXA) 500 MG 12 hr tablet TAKE 1 TABLET BY MOUTH TWICE DAILY. 180 tablet 1   rosuvastatin (CRESTOR) 10 MG tablet TAKE 1 TABLET ONCE DAILY. 90 tablet 3   sitaGLIPtin (JANUVIA)  100 MG tablet Take 100 mg by mouth daily.     No current facility-administered medications on file prior to visit.    Allergies  Allergen Reactions   Macrodantin [Nitrofurantoin] Other (See Comments)    "blocked kidneys"    Tape Other (See Comments)    "Tears my skin and leaves marks." PLEASE USE COBAN!!   Sulfa Antibiotics Rash   Lisinopril Cough   Nutritional Supplements Other (See Comments)    Protein drink given to me "did something"   Penicillins Other (See Comments)    08/30/19- Patient can't remember, was 35+ years ago. OK with trying cephalexin in hospital   Assessment/Plan:  1. Hypertension - BP in clinic today of 128/60 is at goal <130/80 mmHg now that he is taking amlodipine 5 mg daily. Given age of 89 and occasional low diastolic reading, would be okay with SBP goal <140 mmHg. BP is largely impacted by his anxiety. Home BP readings have also improved since last visit and he continues to work on checking BP less frequently throughout the day. Will continue current medications: losartan 100 mg daily, metoprolol tartrate 50 mg BID, doxazosin 4 mg daily, and amlodipine 5 mg daily. He is satisfied with his medication regimen and thinks that this has worked to bring BP to goal. Regarding his question about using clonidine for nightmares, I recommended he discuss this with his PCP or a psychiatrist. Discussed that it should not take the place of one of his HTN medications and would not prefer using it given his baseline low HR. He will try to find a psychiatrist as this would be  helpful to manage his anxiety anyway. Instructed him to continue monitoring his BP daily and to reach out if his BP becomes consistently high at home. Will f/u with HTN clinic as needed and see Dr. Acie Fredrickson for annual visit in June.   Alexis Griffith, PharmD PGY2 Ambulatory Care Pharmacy Resident 03/11/2021 1:59 PM

## 2021-03-11 ENCOUNTER — Other Ambulatory Visit: Payer: Self-pay

## 2021-03-11 ENCOUNTER — Ambulatory Visit: Payer: Medicare Other | Admitting: Student-PharmD

## 2021-03-11 VITALS — BP 128/60 | HR 64

## 2021-03-11 DIAGNOSIS — R0989 Other specified symptoms and signs involving the circulatory and respiratory systems: Secondary | ICD-10-CM

## 2021-03-11 NOTE — Patient Instructions (Signed)
It was nice to see you today!  Your goal blood pressure is less than 130/80 mmHg. In clinic, your blood pressure was 128/60 mmHg.  Medication Changes:  Continue losartan 100 mg daily, metoprolol tartrate 50 mg twice daily, doxazosin 4 mg daily, and amlodipine 5 mg daily  Monitor blood pressure at home daily and keep a log (on your phone or piece of paper) to bring with you to your future visits. Write down date, time, blood pressure and pulse.  Keep up the good work with diet and exercise. Aim for a diet full of vegetables, fruit and lean meats (chicken, [place], fish). Try to limit salt intake by eating fresh or frozen vegetables (instead of canned), rinse canned vegetables prior to cooking and do not add any additional salt to meals.   Follow up with Dr. Acie Fredrickson in June for your annual visit. Call us if your blood pressure goes up consistently at home. [phone]

## 2021-04-21 ENCOUNTER — Other Ambulatory Visit: Payer: Self-pay | Admitting: Physician Assistant

## 2021-04-21 ENCOUNTER — Encounter: Payer: Self-pay | Admitting: Cardiovascular Disease

## 2021-04-21 NOTE — Progress Notes (Signed)
Cardiology Office Note   Date:  04/22/2021   ID:  Alexis, Griffith 1940/10/18, MRN [id number]  PCP:  Alexis Pao, MD  Cardiologist:   Alexis Moores, MD   Chief Complaint  Patient presents with   Coronary Artery Disease   Aortic Stenosis        Palpitations   Problem List  1. Premature ventricular contractions 2. Anxiety 3. Hypertension 4. Hyperlipidemia 5. 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 Bilateral carotid artery disease - moderate 7. 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 TIA 9.  Aortic stenosis:   S/p TAVR      Alexis Griffith is a 81 y.o. male who presents for further evaluation of his palpitations and HTN.  He is having some muscle aches - especially in the morning.  Seems to be bilateral shoulder pain. He has held the pravachol for several weeks but the pain did not resolved. BP has been a little high. Tried taking 1 1/2 of the Losartan / HCTZ and has occasionally takes extra metoprolol.  September 20, 2014:  Alexis Griffith is doing well.  BP has been well controlled.  Tried a dose of Valsartan and got dizzy, he went back to Losartan after that and has done well.  He needs to have cataract surgery .   Jan. 30, 2017: Doing well  He did not have his cataract surgery as scheduled.    Recent labs look good Aches and pains but otherwise doing ok  Aug. 16, 2017:  Doing ok BP has been well controlled.  Having some left shoulder problems   - hurts at night. 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  No CP ,   Still has occasional elevated HTN  Sept. 25, 2018: Doing well.  Is followed by Alexis Griffith for a past CVA and is on Aggrenox ( flagged the ASA dose alert )   Jul 13, 2017:   Alexis Griffith is doing well. Is not exercising  Is having some balance issues.   No CP  Still stress in his life.   PVCs seem to be well controlled.  Is taking metoprolol 50 mg TID    April 12, 2018:  No CP , no dyspnea.  Palpitations are under control   May 24, 2019: Alexis Griffith is seen today for follow-up of his hypertension, palpitations, mild aortic stenosis, hyperlipidemia, anxiety. Has rare episodes of chest pain - randomly  No exercising regularly at all.    Jul 04, 2019:  Alexis Griffith is seen today.  He had complained of chest pain at his last office visit . 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  Ct also showd a 4 mm pulmonary nodule.  He is here to discuss cath .  The cp is on and off.   At times its related to exertion He is not exercising regularly .   Sept. 20, 2021 Alexis Griffith is s/p TAVR since I last saw him. Also recent hospitalization for sepsis, syncope   Hx of CAD, AS, HLD, DM, ? Left renal mass ( ? Cancer )   On Aug. 20 he was admitted with syncope.  Had an indwelling foley develped sepsis from urosepsis  Work up also identified pulmonary nodules.  Has an indwelling foley catheter.   Due to BPH, Also has a left renal mass.    Oct. 28, 2021: Alexis Griffith is seen for follow up of his recent TAVR.  He has CAD. Has been treated medically .   he was recently admitted with cp. He ruled out for MI He was found to be hypertensive - he had skipped several of his meds that AM. Still has foley catheter.  Was at the hospital recently , was given hydralizine - has never started  Also was changed form losartan  to losartan but he also did not make that change either.  Nov. 22, 2021: Alexis Griffith is seen for recent worsening of leg edema Hx of CAD, CHF, TAVR, renal cell CA His BP has been on the low side Will DC Losartan HCT and have him take Losartan 100 mg a day goig forward Will give him a separate script for HCTZ at some point but only if he needs it   Feb. 4, 2022 Alexis Griffith is seen today for follow up of his TAVR, CHF, CAD He continues to have issues with syncope as well as HTN Was [facility] Jan[facility] 26 for near syncope.  He had been out shopping with his wife,  Got  dizzy.   Thought he was going to pass out. Walked out and felt better.   Thinks he may have just had an anxiety attack Troponin was found to be 25. , 15 Was seen by Alexis Griffith.  His metoprolol was increased to 100 mg BID He found his HR to be in the low 50 so he did not continue the high dose  Now is back on metoprolol 50 BID .  He avoids salt  His BP tends to increase in the evingings and at night  Will add amlodipine 2.5 at noon .  He will cont to measure BP   Feb. 18, 2022:  Alexis Griffith is seen today for some increasing cp. He has known coronary artery disease.  He also has had some hypertension recently.  We started him on amlodipine 2.5 mg at his last office visit. Has been given a script for Lexipro  Still has CP,  NTG  Does not help it Radiates to his left scapula .    Dull achy , constant pain .   All day long  His pain is not related to exercise, eating or drinking or change of position.   August 02, 2020: Alexis Griffith is seen today for follow up of his CAD and AS - s/p TAVR NYHA class II-III ( does not do much )   KCCQ score  -   BP is ok Still has some chest wall pain , Not much angina Advised him to exercise - needs to get out and walk    Feb. 20, 2023  Alexis Griffith is seen today for follow up for his CAD , carotid artery disease, chronic diastolic CHF  He is awake every hour or so Will be doing a sleep study next month  Occasional CP  Is not having to take the NTG as much . Not much exercise   He was having lots of leg swelling - was due to amlodipine . He has stopped the amlodipine and his leg swelling has resolved.   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 Burt Knack & Dr. Cyndia Bent   Severe aortic stenosis    Sinus bradycardia on ECG    Sleep apnea    Squamous cell carcinoma of tongue [facility]) September 2012   Followed by Dr. Wilburn Cornelia.   Stroke [facility])    TIA (transient ischemic attack)    Urinary retention 09/11/2019    Past Surgical History:  Procedure Laterality Date   CATARACT EXTRACTION, BILATERAL  10/2015, and 11/2015   with adding  TORIC lenses bilaterally    INTRAVASCULAR PRESSURE WIRE/FFR STUDY N/A 07/07/2019   Procedure: INTRAVASCULAR PRESSURE WIRE/FFR STUDY;  Surgeon: Alexis Man, MD;  Location: [place] [facility];  Service: Cardiovascular;  Laterality: N/A;   LEFT HEART CATH AND CORONARY ANGIOGRAPHY N/A 07/07/2019   Procedure: LEFT HEART CATH AND CORONARY ANGIOGRAPHY;  Surgeon: Alexis Man, MD;  Location: [place] [facility];  Service: Cardiovascular;  Laterality: N/A;   SQUAMOUS CELL CARCINOMA EXCISION     tongue   TEE WITHOUT CARDIOVERSION N/A 08/15/2019   Procedure: TRANSESOPHAGEAL ECHOCARDIOGRAM (TEE);  Surgeon: Sherren Mocha, MD;  Location: [place];  Service: Open Heart Surgery;  Laterality: N/A;   TRANSCATHETER AORTIC VALVE REPLACEMENT, TRANSFEMORAL N/A 08/15/2019   Procedure: TRANSCATHETER AORTIC VALVE REPLACEMENT, TRANSFEMORAL using Edwards [employer] SAPIEN 3 Ultra 26 mm THV.;  Surgeon: Sherren Mocha, MD;  Location: [place];  Service: Open Heart Surgery;  Laterality: N/A;     Current Outpatient Medications  Medication Sig  Dispense Refill   acetaminophen (TYLENOL) 325 MG tablet Take 650 mg by mouth every 6 (six) hours as needed for mild pain.     aspirin 81 MG chewable tablet Chew 1 tablet (81 mg total) by mouth daily. 360 tablet 0   clonazePAM (KLONOPIN) 0.5 MG tablet Take 0.5 mg by mouth daily.     doxazosin (CARDURA) 4 MG tablet Take 1 tablet (4 mg total) by mouth daily. 90 tablet 3   glucose blood test strip 1 each by Other route as needed.      insulin glargine (LANTUS) 100 UNIT/ML injection Inject 70 Units into the skin daily.     isosorbide mononitrate (IMDUR) 30 MG 24 hr tablet take ONE & 1/2 tablets by MOUTH daily. 135 tablet 1   Lancets (ONETOUCH DELICA PLUS [id number]) MISC 1 each by Other route daily.      losartan (COZAAR) 100 MG tablet TAKE 1 TABLET ONCE DAILY. 90 tablet 3   magnesium oxide (MAG-OX) 400 MG tablet Take 400 mg by mouth daily.     metoprolol tartrate (LOPRESSOR) 50 MG tablet TAKE ONE TABLET BY MOUTH TWICE DAILY 180 tablet 3   nitroGLYCERIN (NITROSTAT) 0.4 MG SL tablet PLACE 1 TABLET UNDER THE TONGUE EVERY 5 MINUTES AS NEEDED FOR CHEST PAIN UP TO 3 DOSES 25 tablet 7   ONE TOUCH ULTRA TEST test strip 1 each by Other route daily.      ranolazine (RANEXA) 500 MG 12 hr tablet TAKE 1 TABLET BY  MOUTH TWICE DAILY. 180 tablet 1   rosuvastatin (CRESTOR) 10 MG tablet TAKE 1 TABLET ONCE DAILY. 90 tablet 3   sitaGLIPtin (JANUVIA) 100 MG tablet Take 100 mg by mouth daily.     amLODipine (NORVASC) 5 MG tablet Take 5 mg by mouth daily. (Patient not taking: Reported on 04/22/2021)     FENOFIBRATE PO Take 135 mg by mouth daily.  (Patient not taking: Reported on 04/22/2021)     No current facility-administered medications for this visit.    Allergies:   Macrodantin [nitrofurantoin], Tape, Sulfa antibiotics, Lisinopril, Nutritional supplements, and Penicillins    Social History:  The patient  reports that he quit smoking about 45 years ago. His smoking use included cigarettes. He has never used smokeless  tobacco. He reports that he does not drink alcohol and does not use drugs.   Family History:  The patient's family history includes Heart attack in his father and mother; Heart disease in his father and mother.    ROS:   Noted in current history, otherwise review of systems is negative.   Physical Exam: Blood pressure (!) 122/50, pulse 62, height 5\' 10"  (1.778 m), weight 199 lb 12.8 oz (90.6 kg), SpO2 94 %.  GEN:  Well nourished, well developed in no acute distress HEENT: Normal NECK: No JVD; Nbilat carotid bruits vs radiation of his AV murmur  LYMPHATICS: No lymphadenopathy CARDIAC: RRR , soft systolic murmur  RESPIRATORY:  Clear to auscultation without rales, wheezing or rhonchi  ABDOMEN: Soft, non-tender, non-distended MUSCULOSKELETAL:  No edema; No deformity  SKIN: Warm and dry NEUROLOGIC:  Alert and oriented x 3    EKG: April 22, 2021: Normal sinus rhythm at 62.  T wave inversions in leads III and aVF, V5 and V6.  No changes from previous EKGs.  Recent Labs: 05/19/2020: BUN 15; Creatinine, Ser 1.02; Hemoglobin 15.3; Platelets 163; Potassium 3.7; Sodium 134    Lipid Panel    Component Value Date/Time   CHOL 130 09/09/2019 0308   CHOL 139 06/14/2019 0959   TRIG 68 09/09/2019 0308   HDL 40 (L) 09/09/2019 0308   HDL 35 (L) 06/14/2019 0959   CHOLHDL 3.3 09/09/2019 0308   VLDL 14 09/09/2019 0308   LDLCALC 76 09/09/2019 0308   LDLCALC 80 06/14/2019 0959      Wt Readings from Last 3 Encounters:  04/22/21 199 lb 12.8 oz (90.6 kg)  08/02/20 192 lb 3.2 oz (87.2 kg)  05/19/20 190 lb (86.2 kg)      Other studies Reviewed: Additional studies/ records that were reviewed today include: . Review of the above records demonstrates:    ASSESSMENT AND PLAN:  1.  Coronary artery disease:     He has occasional episodes of angina.  Is not taking nearly as much nitroglycerin as he was previously.   2.  Aortic stenosis-   he is status post TAVR.  Seems to be doing  well.    3. Hypertension -blood pressure is well controlled.  He was previously on amlodipine but this caused significant leg edema.  The leg edema has resolved now that he stopped amlodipine.    4. Hyperlipidemia -     lipids at lipids of been well controlled.   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 Bilateral carotid artery disease -   He has bilateral carotid bruits which are stable.    Current medicines are reviewed at length with the patient today.  The patient does  not have concerns regarding medicines.  The following changes have been made:  no change   Disposition:         Signed, Alexis Moores, MD  04/22/2021 10:21 AM    [facility] [place], [place], [place]  [address] Phone: [phone]; Fax: [phone]

## 2021-04-22 ENCOUNTER — Encounter: Payer: Self-pay | Admitting: Cardiovascular Disease

## 2021-04-22 ENCOUNTER — Other Ambulatory Visit: Payer: Self-pay

## 2021-04-22 ENCOUNTER — Ambulatory Visit: Payer: Medicare Other | Admitting: Cardiovascular Disease

## 2021-04-22 VITALS — BP 122/50 | HR 62 | Ht 70.0 in | Wt 199.8 lb

## 2021-04-22 DIAGNOSIS — I25118 Atherosclerotic heart disease of native coronary artery with other forms of angina pectoris: Secondary | ICD-10-CM | POA: Diagnosis not present

## 2021-04-22 DIAGNOSIS — I5032 Chronic diastolic (congestive) heart failure: Secondary | ICD-10-CM | POA: Diagnosis not present

## 2021-04-22 DIAGNOSIS — Z952 Presence of prosthetic heart valve: Secondary | ICD-10-CM

## 2021-04-22 DIAGNOSIS — I6523 Occlusion and stenosis of bilateral carotid arteries: Secondary | ICD-10-CM | POA: Diagnosis not present

## 2021-04-22 DIAGNOSIS — I350 Nonrheumatic aortic (valve) stenosis: Secondary | ICD-10-CM

## 2021-04-22 NOTE — Addendum Note (Signed)
Addended by: Lynn Ito on: 04/22/2021 03:08 PM   Modules accepted: Orders

## 2021-04-22 NOTE — Patient Instructions (Signed)
Medication Instructions:  Your physician recommends that you continue on your current medications as directed. Please refer to the Current Medication list given to you today.   *If you need a refill on your cardiac medications before your next appointment, please call your pharmacy*   Follow-Up: At [facility], you and your health needs are our priority.  As part of our continuing mission to provide you with exceptional heart care, we have created designated Provider Care Teams.  These Care Teams include your primary Cardiologist (physician) and Advanced Practice Providers (APPs -  Physician Assistants and Nurse Practitioners) who all work together to provide you with the care you need, when you need it.   Your next appointment:   6 month(s)  The format for your next appointment:   In Person  Provider:   Avonte Nahser, MD          

## 2021-05-28 ENCOUNTER — Institutional Professional Consult (permissible substitution): Payer: Medicare Other | Admitting: Neurology

## 2021-06-13 ENCOUNTER — Other Ambulatory Visit: Payer: Self-pay | Admitting: Cardiovascular Disease

## 2021-06-16 ENCOUNTER — Telehealth: Payer: Self-pay | Admitting: Cardiovascular Disease

## 2021-06-16 DIAGNOSIS — I493 Ventricular premature depolarization: Secondary | ICD-10-CM

## 2021-06-16 DIAGNOSIS — R002 Palpitations: Secondary | ICD-10-CM

## 2021-06-16 NOTE — Telephone Encounter (Signed)
Spoke with the patient who reports that last night his heart rate went down to 32 bpm. He states that was the lowest it got but it did stay around 36-40bpm for a bit. He states that this morning HR was in the 60s and then later this afternoon dropped back down. He reports that currently HR is 71. He was checking his HR with a BP cuff and pulse ox and was getting the same readings. He denies any lightheadedness or dizziness. Patient is staying hydrated. Patient reports that yesterday he took 3 Lopressors. He states that Dr. Acie Fredrickson told him he could do this for elevated BP. Patient also reports that he tried to take his pulse manually but couldn't feel it. He also reports feeling skipped beats. He states that they felt different from PVC's he has had in the past. Reports that palpitations were worse when lying down. Advised to continue to monitor and make sure he is checking his HR prior to taking any extra Lopressor.  ?

## 2021-06-16 NOTE — Telephone Encounter (Signed)
STAT if HR is under 50 or over 120 ?(normal HR is 60-100 beats per minute) ? ?What is your heart rate? HR 74, oxygen 96% ? ?Do you have a log of your heart rate readings (document readings)? 32-70's ? ?Do you have any other symptoms? no ? ? ?Patient states his HR has been low since yesterday. He says his HR dropped down to 32 and he was very concerned so she stayed up all night. He states it cleared up this morning, but this afternoon it's low again.  ? ? ?

## 2021-06-18 ENCOUNTER — Ambulatory Visit (INDEPENDENT_AMBULATORY_CARE_PROVIDER_SITE_OTHER): Payer: Medicare Other

## 2021-06-18 DIAGNOSIS — R002 Palpitations: Secondary | ICD-10-CM

## 2021-06-18 DIAGNOSIS — I493 Ventricular premature depolarization: Secondary | ICD-10-CM

## 2021-06-18 NOTE — Progress Notes (Unsigned)
Enrolled patient for a 3 day Zio XT monitor to be mailed to patients home  ? ?06/23/2021 monitor serial # [id number] mailed to patient 06/18/21 was applied in office. ?

## 2021-06-18 NOTE — Telephone Encounter (Signed)
Patient did not have any symptoms. Heart monitor has been ordered. Patient is aware.  ?

## 2021-06-18 NOTE — Telephone Encounter (Signed)
Patient wants to know if he can come in for an EKG today.  She states he can't wait so long for a monitor.   ?

## 2021-06-20 NOTE — Telephone Encounter (Signed)
Nahser, Wonda Cheng, MD ? ?Lets place an 14 day event monitor  ?Ok if he wants to come in for ecg  ? ?PN  ? ? ?Spoke with pt regarding coming in for EKG.  He would like to have one despite monitor being mailed 4/19.  First available appt here for an EKG is on Monday and pt states he will go to his PCP-Dr Welton Flakes to have one done today.  This is much closer to home for him as well. If there is a problem with it, he will have Dr Welton Flakes contact the office.   He denies any syncope, dizziness etc.  Pt does report the change in his heart beat feels differently than in the past.  He reports the lowest his HR has been was 37 bpm but is "better this morning." ? ?3 day monitor was mailed 4/19.  In checking with monitor tech, monitor is out for delivery today.  Of note- pt prefers to only wear a monitor for 3 days. ? ?

## 2021-06-23 DIAGNOSIS — R002 Palpitations: Secondary | ICD-10-CM

## 2021-06-23 DIAGNOSIS — I493 Ventricular premature depolarization: Secondary | ICD-10-CM | POA: Diagnosis not present

## 2021-08-04 ENCOUNTER — Other Ambulatory Visit: Payer: Self-pay | Admitting: Cardiovascular Disease

## 2021-09-04 ENCOUNTER — Ambulatory Visit (HOSPITAL_COMMUNITY)
Admission: RE | Admit: 2021-09-04 | Discharge: 2021-09-04 | Disposition: A | Discharge status: Home / Self Care | Payer: Medicare Other | Source: Ambulatory Visit | Attending: Cardiology | Admitting: Cardiology

## 2021-09-04 DIAGNOSIS — I6523 Occlusion and stenosis of bilateral carotid arteries: Secondary | ICD-10-CM | POA: Diagnosis present

## 2021-10-17 ENCOUNTER — Other Ambulatory Visit: Payer: Self-pay | Admitting: Physician Assistant

## 2021-10-20 ENCOUNTER — Encounter: Payer: Self-pay | Admitting: Cardiovascular Disease

## 2021-10-20 NOTE — Progress Notes (Unsigned)
Cardiology Office Note   Date:  10/21/2021   ID:  Alexis Griffith, Alexis Griffith 03-May-1940, MRN [id number]  PCP:  Haywood Pao, MD  Cardiologist:   Mertie Moores, MD   Chief Complaint  Patient presents with   Coronary Artery Disease        Problem List  1. Premature ventricular contractions 2. Anxiety 3. Hypertension 4. Hyperlipidemia 5. 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 Bilateral carotid artery disease - moderate 7. 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 TIA 9.  Aortic stenosis:   S/p TAVR      Alexis Griffith is a 81 y.o. male who presents for further evaluation of his palpitations and HTN.  He is having some muscle aches - especially in the morning.  Seems to be bilateral shoulder pain. He has held the pravachol for several weeks but the pain did not resolved. BP has been a little high. Tried taking 1 1/2 of the Losartan / HCTZ and has occasionally takes extra metoprolol.  September 20, 2014:  Alexis Griffith is doing well.  BP has been well controlled.  Tried a dose of Valsartan and got dizzy, he went back to Losartan after that and has done well.  He needs to have cataract surgery .   Jan. 30, 2017: Doing well  He did not have his cataract surgery as scheduled.    Recent labs look good Aches and pains but otherwise doing ok  Aug. 16, 2017:  Doing ok BP has been well controlled.  Having some left shoulder problems   - hurts at night. 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  No CP ,   Still has occasional elevated HTN  Sept. 25, 2018: Doing well.  Is followed by Alexis Griffith for a past CVA and is on Aggrenox ( flagged the ASA dose alert )   Jul 13, 2017:   Alexis Griffith is doing well. Is not exercising  Is having some balance issues.   No CP  Still stress in his life.   PVCs seem to be well controlled.  Is taking metoprolol 50 mg TID   April 12, 2018:  No CP ,  no dyspnea.  Palpitations are under control   May 24, 2019: Alexis Griffith is seen today for follow-up of his hypertension, palpitations, mild aortic stenosis, hyperlipidemia, anxiety. Has rare episodes of chest pain - randomly  No exercising regularly at all.    Jul 04, 2019:  Alexis Griffith is seen today.  He had complained of chest pain at his last office visit . 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  Ct also showd a 4 mm pulmonary nodule.  He is here to discuss cath .  The cp is on and off.   At times its related to exertion He is not exercising regularly .   Sept. 20, 2021 Alexis Griffith is s/p TAVR since I last saw him. Also recent hospitalization for sepsis, syncope   Hx of CAD, AS, HLD, DM, ? Left renal mass ( ? Cancer )   On Aug. 20 he was admitted with syncope.  Had an indwelling foley develped sepsis from urosepsis  Work up also identified pulmonary nodules.  Has an indwelling foley catheter.   Due to BPH, Also has a left renal mass.    Oct. 28, 2021: Alexis Griffith is seen for follow up of his recent TAVR. He has CAD. Has been treated medically .  he was recently admitted with cp. He ruled out for MI He was found to be hypertensive - he had skipped several of his meds that AM. Still has foley catheter.  Was at the hospital recently , was given hydralizine - has never started  Also was changed form losartan  to losartan but he also did not make that change either.  Nov. 22, 2021: Alexis Griffith is seen for recent worsening of leg edema Hx of CAD, CHF, TAVR, renal cell CA His BP has been on the low side Will DC Losartan HCT and have him take Losartan 100 mg a day goig forward Will give him a separate script for HCTZ at some point but only if he needs it   Feb. 4, 2022 Alexis Griffith is seen today for follow up of his TAVR, CHF, CAD He continues to have issues with syncope as well as HTN Was [facility] for near syncope.  He had been out shopping with his wife,  Got dizzy.   Thought he was going to  pass out. Walked out and felt better.   Thinks he may have just had an anxiety attack Troponin was found to be 25. , 61 Was seen by Dr. Loletha Grayer.  His metoprolol was increased to 100 mg BID He found his HR to be in the low 50 so he did not continue the high dose  Now is back on metoprolol 50 BID .  He avoids salt  His BP tends to increase in the evingings and at night  Will add amlodipine 2.5 at noon .  He will cont to measure BP   Feb. 18, 2022:  Alexis Griffith is seen today for some increasing cp. He has known coronary artery disease.  He also has had some hypertension recently.  We started him on amlodipine 2.5 mg at his last office visit. Has been given a script for Lexipro  Still has CP,  NTG  Does not help it Radiates to his left scapula .    Dull achy , constant pain .   All day long  His pain is not related to exercise, eating or drinking or change of position.   August 02, 2020: Alexis Griffith is seen today for follow up of his CAD and AS - s/p TAVR NYHA class II-III ( does not do much )   KCCQ score  -   BP is ok Still has some chest wall pain , Not much angina Advised him to exercise - needs to get out and walk    Feb. 20, 2023  Alexis Griffith is seen today for follow up for his CAD , carotid artery disease, chronic diastolic CHF  He is awake every hour or so Will be doing a sleep study next month  Occasional CP  Is not having to take the NTG as much . Not much exercise   He was having lots of leg swelling - was due to amlodipine . He has stopped the amlodipine and his leg swelling has resolved.    Aug. 22, 2023 Alexis Griffith is seen for follow up of his CAD, CArotid artery disease, TAVR,  chronic diastolic CHF  He is having lots of intense flushing around 4 PM every day.  He thinks it might be due to the isosorbide.  I agree that we should decrease the dose back to results we put in 0 mg a day.  Has moderate R ICA  stenosis  Mild L ICA stenosis  Informed that he is  stopped taking a statin because of  diffuse muscle aches.  We will refer him to the lipid clinic for consideration of a PCSK9 inhibitor.  Past Medical History:  Diagnosis Date   Anxiety    Bladder neck obstruction 06/05/2013   CAD (coronary artery disease)    Carotid artery disease (HCC)    CTA 08/2019 of the head and neck show moderate 65 to 75% bilateral ICA stenoses    Chronic diastolic CHF (congestive heart failure) (HCC)    Diabetes mellitus type 2 in nonobese (HCC)    Excessive urination at night 06/05/2013   Giant cell arteritis ([place]) 05/17/2012   Hyperlipidemia    Hypertension    Hypertensive urgency    Hypomagnesemia 09/11/2019   Mild anemia 09/11/2019   NSTEMI (non-ST elevated myocardial infarction) ([place]) 09/09/2019   Orthostatic hypotension 09/11/2019   Premature atrial contractions 09/12/2019   Pulmonary nodules    PVC (premature ventricular contraction)    Renal mass    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 Burt Knack & Dr. Cyndia Bent   Severe aortic stenosis    Sinus bradycardia on ECG    Sleep apnea    Squamous cell carcinoma of tongue [facility]) September 2012   Followed by Dr. Wilburn Cornelia.   Stroke [facility])    TIA (transient ischemic attack)    Urinary retention 09/11/2019    Past Surgical History:  Procedure Laterality Date   CATARACT EXTRACTION, BILATERAL  10/2015, and 11/2015   with adding  TORIC lenses bilaterally    INTRAVASCULAR PRESSURE WIRE/FFR STUDY N/A 07/07/2019   Procedure: INTRAVASCULAR PRESSURE WIRE/FFR STUDY;  Surgeon: Alexis Man, MD;  Location: [place] CV [facility];  Service: Cardiovascular;  Laterality: N/A;   LEFT HEART CATH AND CORONARY ANGIOGRAPHY N/A 07/07/2019   Procedure: LEFT HEART CATH AND CORONARY ANGIOGRAPHY;  Surgeon: Alexis Man, MD;  Location: [place] [facility];  Service: Cardiovascular;  Laterality: N/A;   SQUAMOUS CELL CARCINOMA EXCISION     tongue   TEE WITHOUT CARDIOVERSION N/A 08/15/2019   Procedure:  TRANSESOPHAGEAL ECHOCARDIOGRAM (TEE);  Surgeon: Sherren Mocha, MD;  Location: [facility];  Service: Open Heart Surgery;  Laterality: N/A;   TRANSCATHETER AORTIC VALVE REPLACEMENT, TRANSFEMORAL N/A 08/15/2019   Procedure: TRANSCATHETER AORTIC VALVE REPLACEMENT, TRANSFEMORAL using Edwards [employer] SAPIEN 3 Ultra 26 mm THV.;  Surgeon: Sherren Mocha, MD;  Location: [place];  Service: Open Heart Surgery;  Laterality: N/A;     Current Outpatient Medications  Medication Sig Dispense Refill   acetaminophen (TYLENOL) 325 MG tablet Take 650 mg by mouth every 6 (six) hours as needed for mild pain.     aspirin 81 MG chewable tablet Chew 1 tablet (81 mg total) by mouth daily. 360 tablet 0   Choline Fenofibrate (FENOFIBRIC ACID) 135 MG CPDR Take 1 capsule by mouth daily.     clonazePAM (KLONOPIN) 0.5 MG tablet Take 0.5 mg by mouth daily.     doxazosin (CARDURA) 4 MG tablet Take 1 tablet (4 mg total) by mouth daily. 90 tablet 3   glucose blood test strip 1 each by Other route as needed.      insulin glargine (LANTUS) 100 UNIT/ML injection Inject 70 Units into the skin daily.     Lancets (ONETOUCH DELICA PLUS [id number]) MISC 1 each by Other route daily.      losartan (COZAAR) 100 MG tablet TAKE 1 TABLET ONCE DAILY. 90 tablet 3  magnesium oxide (MAG-OX) 400 MG tablet Take 400 mg by mouth daily.     metoprolol tartrate (LOPRESSOR) 50 MG tablet TAKE ONE TABLET BY MOUTH TWICE DAILY 180 tablet 3   nitroGLYCERIN (NITROSTAT) 0.4 MG SL tablet PLACE 1 TABLET UNDER THE TONGUE EVERY 5 MINUTES AS NEEDED FOR CHEST PAIN UP TO 3 DOSES 25 tablet 7   ONE TOUCH ULTRA TEST test strip 1 each by Other route daily.      ranolazine (RANEXA) 500 MG 12 hr tablet TAKE 1 TABLET BY MOUTH TWICE DAILY. 180 tablet 1   rosuvastatin (CRESTOR) 10 MG tablet TAKE 1 TABLET ONCE DAILY. 90 tablet 3   sitaGLIPtin (JANUVIA) 100 MG tablet Take 100 mg by mouth daily.     No current facility-administered medications for this visit.    Allergies:    Macrodantin [nitrofurantoin], Tape, Sulfa antibiotics, Lisinopril, Nutritional supplements, and Penicillins    Social History:  The patient  reports that he quit smoking about 46 years ago. His smoking use included cigarettes. He has never used smokeless tobacco. He reports that he does not drink alcohol and does not use drugs.   Family History:  The patient's family history includes Heart attack in his father and mother; Heart disease in his father and mother.    ROS:   Noted in current history, otherwise review of systems is negative.   Physical Exam: Blood pressure (!) 142/86, pulse 69, height '5\' 10"'$  (1.778 m), weight 201 lb 9.6 oz (91.4 kg), SpO2 94 %.  GEN:  Well nourished, well developed in no acute distress HEENT: Normal NECK: No JVD; bilateral carotid bruits LYMPHATICS: No lymphadenopathy CARDIAC: RRR , soft systolic murmur RESPIRATORY:  Clear to auscultation without rales, wheezing or rhonchi  ABDOMEN: Soft, non-tender, non-distended MUSCULOSKELETAL:  No edema; No deformity  SKIN: Warm and dry NEUROLOGIC:  Alert and oriented x 3     EKG: October 21, 2021: Normal sinus rhythm at 69.  First-degree AV block.  Lateral ST and T wave changes.   Recent Labs: No results found for requested labs within last 365 days.    Lipid Panel    Component Value Date/Time   CHOL 130 09/09/2019 0308   CHOL 139 06/14/2019 0959   TRIG 68 09/09/2019 0308   HDL 40 (L) 09/09/2019 0308   HDL 35 (L) 06/14/2019 0959   CHOLHDL 3.3 09/09/2019 0308   VLDL 14 09/09/2019 0308   LDLCALC 76 09/09/2019 0308   LDLCALC 80 06/14/2019 0959      Wt Readings from Last 3 Encounters:  10/21/21 201 lb 9.6 oz (91.4 kg)  04/22/21 199 lb 12.8 oz (90.6 kg)  08/02/20 192 lb 3.2 oz (87.2 kg)      Other studies Reviewed: Additional studies/ records that were reviewed today include: . Review of the above records demonstrates:    ASSESSMENT AND PLAN:  1.  Coronary artery disease:     No angina.  The  isosorbide might be giving him some flushing.  We will reduce the isosorbide back down to 30 mg a day and see if this helps.    2.  Aortic stenosis-   he is status post TAVR.  Seems to be doing well.    3. Hypertension -blood pressure seems to be well controlled.     4. Hyperlipidemia -   he has not tolerated the rosuvastatin.  He has been having lots of flushing.  We will refer him to the lipid clinic for consideration of a PCSK9 inhibitor.  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 Bilateral carotid artery disease -   Has mild to moderate carotid artery disease.  He has bilateral bruits.      Current medicines are reviewed at length with the patient today.  The patient does not have concerns regarding medicines.  The following changes have been made:  no change   Disposition:         Signed, Mertie Moores, MD  10/21/2021 1:53 PM    [facility] [place], [place], [place]  [address] Phone: [phone]; Fax: [phone]

## 2021-10-21 ENCOUNTER — Ambulatory Visit: Payer: Medicare Other | Admitting: Cardiovascular Disease

## 2021-10-21 ENCOUNTER — Encounter: Payer: Self-pay | Admitting: Cardiovascular Disease

## 2021-10-21 VITALS — BP 142/86 | HR 69 | Ht 70.0 in | Wt 201.6 lb

## 2021-10-21 DIAGNOSIS — E782 Mixed hyperlipidemia: Secondary | ICD-10-CM | POA: Diagnosis not present

## 2021-10-21 DIAGNOSIS — E119 Type 2 diabetes mellitus without complications: Secondary | ICD-10-CM

## 2021-10-21 DIAGNOSIS — I10 Essential (primary) hypertension: Secondary | ICD-10-CM

## 2021-10-21 DIAGNOSIS — I2510 Atherosclerotic heart disease of native coronary artery without angina pectoris: Secondary | ICD-10-CM | POA: Diagnosis not present

## 2021-10-21 MED ORDER — ISOSORBIDE MONONITRATE ER 30 MG PO TB24: 30.0000 mg | ORAL_TABLET | Freq: Every day | ORAL | 3 refills | Status: DC

## 2021-10-21 NOTE — Patient Instructions (Signed)
Medication Instructions:  DECREASE Isosorbide to '30mg'$  daily *If you need a refill on your cardiac medications before your next appointment, please call your pharmacy*   Lab Work: NONE If you have labs (blood work) drawn today and your tests are completely normal, you will receive your results only by: [place] (if you have MyChart) OR A paper copy in the mail If you have any lab test that is abnormal or we need to change your treatment, we will call you to review the results.   Testing/Procedures: Ambulatory referral to Lipid clinic   Follow-Up: At [facility], you and your health needs are our priority.  As part of our continuing mission to provide you with exceptional heart care, we have created designated Provider Care Teams.  These Care Teams include your primary Cardiologist (physician) and Advanced Practice Providers (APPs -  Physician Assistants and Nurse Practitioners) who all work together to provide you with the care you need, when you need it.  We recommend signing up for the patient portal called "MyChart".  Sign up information is provided on this After Visit Summary.  MyChart is used to connect with patients for Virtual Visits (Telemedicine).  Patients are able to view lab/test results, encounter notes, upcoming appointments, etc.  Non-urgent messages can be sent to your provider as well.   To learn more about what you can do with MyChart, go to [email or web address].    Your next appointment:   6 month(s)  The format for your next appointment:   In Person  Provider:   Mertie Moores, MD       Important Information About Sugar

## 2021-11-18 ENCOUNTER — Other Ambulatory Visit: Payer: Self-pay | Admitting: Cardiovascular Disease

## 2021-11-21 ENCOUNTER — Ambulatory Visit: Payer: Medicare Other

## 2021-11-26 ENCOUNTER — Other Ambulatory Visit (HOSPITAL_COMMUNITY): Payer: Self-pay | Admitting: Cardiovascular Disease

## 2021-11-26 DIAGNOSIS — I779 Disorder of arteries and arterioles, unspecified: Secondary | ICD-10-CM

## 2021-12-17 ENCOUNTER — Ambulatory Visit: Payer: Medicare Other | Attending: Cardiology | Admitting: Pharmacist

## 2021-12-17 VITALS — HR 69

## 2021-12-17 DIAGNOSIS — E1169 Type 2 diabetes mellitus with other specified complication: Secondary | ICD-10-CM | POA: Diagnosis not present

## 2021-12-17 DIAGNOSIS — R351 Nocturia: Secondary | ICD-10-CM

## 2021-12-17 DIAGNOSIS — G459 Transient cerebral ischemic attack, unspecified: Secondary | ICD-10-CM | POA: Diagnosis not present

## 2021-12-17 DIAGNOSIS — F515 Nightmare disorder: Secondary | ICD-10-CM

## 2021-12-17 DIAGNOSIS — E785 Hyperlipidemia, unspecified: Secondary | ICD-10-CM

## 2021-12-17 NOTE — Patient Instructions (Signed)
Please continue taking rosuvastatin '10mg'$  daily and fenofibrate Please come for fasting labs next Tues 10/24 Please start taking walks

## 2021-12-17 NOTE — Progress Notes (Signed)
Patient ID: [name]                 DOB: 10-Aug-1940                    MRN: [id number]      HPI: Alexis Griffith is a 81 y.o. male patient referred to lipid clinic by Dr. Acie Griffith. PMH is significant for PVC, HTN, HLD, TIA, aortic stenosis s/p TAVR, giant cell arteritis, DM, 3 V CAD per CT and carcinoma of the tongue.  Patient reported to Dr. Acie Griffith at his last appointment that he was not taking rosuvastatin. Patient referred to lipid clinic.  Patient presents to clinic accompanied by his wife of >60 years. He reports to me that he is taking rosuvastatin. Brings in a bag of his medications and the bottle of rosuvastatin is in there. At first he says that his biggest problem is the nightmares he is having. States its been happening ever since his TAVR. Patient does not think its the rosuvastatin as he has been taking this for a long time. He was previously on clonazepam multiple times a day. After being d/c from hospital and rehab post TAVR/stroke his PCP reduced it to 1 tablet a day. Patient is upset about this.  He is on 70 units of Lantus. Checks his BG in the AM and at 4PM. States he gets hot and sweaty after lunch until the evening. Does not check his blood sugars when he feels this way. He use to follow with Alexis Griffith at [facility] but hasnt seen her in 1.5 years per his report. Only sees Dr. Osborne Griffith once per year. Does not get any exercise. His wife walks daily but he doesn't "feel" like going with her.  Current Medications: rosuvastatin '10mg'$  daily, fenofibric acid '135mg'$  daily  Intolerances: pravastatin '20mg'$ , '40mg'$ , rosuvastatin '20mg'$ , '10mg'$ , '5mg'$   Risk Factors: CVA, age, DM, CAD, HTN LDL goal: <55  Diet: bacon, eggs, oatmeal w/ fruit on top, toast  Lunch: BLT, salad, fruit, kabobs Dinner: hamburger, chicken on grill , vegetable, beets,  Water, diet coke  Exercise: none  Family History:  Family History  Problem Relation Age of Onset   Heart disease Mother    Heart attack  Mother    Heart disease Father    Heart attack Father      Social History:  Social History   Socioeconomic History   Marital status: Married    Spouse name: Not on file   Number of children: 2   Years of education: Not on file   Highest education level: Not on file  Occupational History   Not on file  Tobacco Use   Smoking status: Former    Types: Cigarettes    Quit date: 06/25/1975    Years since quitting: 46.5   Smokeless tobacco: Never  Vaping Use   Vaping Use: Never used  Substance and Sexual Activity   Alcohol use: No   Drug use: No   Sexual activity: Yes  Other Topics Concern   Not on file  Social History Narrative   Patient lives at home with his wife.    Social Determinants of Health   Financial Resource Strain: Not on file  Food Insecurity: Not on file  Transportation Needs: Not on file  Physical Activity: Not on file  Stress: Not on file  Social Connections: Not on file  Intimate Partner Violence: Not on file    Labs:06/20/21 TC 148, TG 250, HDL 40,  LDL-C 68 (rosuvastatin '10mg'$  daily, fenofibric acid '135mg'$  daily )  Past Medical History:  Diagnosis Date   Anxiety    Bladder neck obstruction 06/05/2013   CAD (coronary artery disease)    Carotid artery disease (HCC)    CTA 08/2019 of the head and neck show moderate 65 to 75% bilateral ICA stenoses    Chronic diastolic CHF (congestive heart failure) (HCC)    Diabetes mellitus type 2 in nonobese (HCC)    Excessive urination at night 06/05/2013   Giant cell arteritis ([place]) 05/17/2012   Hyperlipidemia    Hypertension    Hypertensive urgency    Hypomagnesemia 09/11/2019   Mild anemia 09/11/2019   NSTEMI (non-ST elevated myocardial infarction) ([place]) 09/09/2019   Orthostatic hypotension 09/11/2019   Premature atrial contractions 09/12/2019   Pulmonary nodules    PVC (premature ventricular contraction)    Renal mass    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 Burt Knack & Dr. Cyndia Bent   Severe aortic stenosis    Sinus bradycardia on ECG    Sleep apnea    Squamous cell carcinoma of tongue [facility]) September 2012   Followed by Dr. Wilburn Cornelia.   Stroke [facility])    TIA (transient ischemic attack)    Urinary retention 09/11/2019    Current Outpatient Medications on File Prior to Visit  Medication Sig Dispense Refill   acetaminophen (TYLENOL) 325 MG tablet Take 650 mg by mouth every 6 (six) hours as needed for mild pain.     aspirin 81 MG chewable tablet Chew 1 tablet (81 mg total) by mouth daily. 360 tablet 0   Choline Fenofibrate (FENOFIBRIC ACID) 135 MG CPDR Take 1 capsule by mouth daily.     clonazePAM (KLONOPIN) 0.5 MG tablet Take 0.5 mg by mouth daily.     doxazosin (CARDURA) 4 MG tablet TAKE ONE TABLET BY MOUTH DAILY 90 tablet 3   glucose blood test strip 1 each by Other route as needed.      insulin glargine (LANTUS) 100 UNIT/ML injection Inject 70 Units into the skin daily.     isosorbide mononitrate (IMDUR) 30 MG 24 hr tablet Take 1 tablet (30 mg total) by mouth daily. 90 tablet 3   Lancets (ONETOUCH DELICA PLUS [id number]) MISC 1 each by Other route daily.      losartan (COZAAR) 100 MG tablet TAKE 1 TABLET ONCE DAILY. 90 tablet 3   magnesium oxide (MAG-OX) 400 MG tablet Take 400 mg by mouth daily.     metoprolol tartrate (LOPRESSOR) 50 MG tablet TAKE ONE TABLET BY MOUTH TWICE DAILY 180 tablet 3   nitroGLYCERIN (NITROSTAT) 0.4 MG SL tablet PLACE 1 TABLET UNDER THE TONGUE EVERY 5 MINUTES AS NEEDED FOR CHEST PAIN UP TO 3 DOSES 25 tablet 7   ONE TOUCH ULTRA TEST test strip 1 each by Other route daily.      ranolazine (RANEXA) 500 MG 12 hr tablet TAKE 1 TABLET BY MOUTH TWICE DAILY. 180 tablet 1   rosuvastatin (CRESTOR) 10 MG tablet TAKE 1 TABLET ONCE DAILY. 90 tablet 3   sitaGLIPtin (JANUVIA) 100 MG tablet Take 100 mg by mouth daily.     No current facility-administered medications on file prior to visit.    Allergies  Allergen  Reactions   Macrodantin [Nitrofurantoin] Other (See Comments)    "blocked kidneys"    Tape Other (See Comments)    "Tears my skin and leaves marks."  PLEASE USE COBAN!!   Sulfa Antibiotics Rash   Lisinopril Cough   Nutritional Supplements Other (See Comments)    Protein drink given to me "did something"   Penicillins Other (See Comments)    08/30/19- Patient can't remember, was 35+ years ago. OK with trying cephalexin in hospital    Assessment/Plan:  1. Hyperlipidemia -   No problem-specific Assessment & Plan notes found for this encounter.  '@MTPCOMPLETEDLIST'$ @    Thank you,   Ramond Dial, Pharm.D, BCPS, CPP [place]  [address] [place], [place] [address]  Phone: [phone]; Fax: [phone]

## 2021-12-18 DIAGNOSIS — F515 Nightmare disorder: Secondary | ICD-10-CM | POA: Insufficient documentation

## 2021-12-18 NOTE — Assessment & Plan Note (Addendum)
Assessment:  Patient states he is taking rosuvastatin '10mg'$  daily and fenofibric acid '135mg'$  daily. Reports never stopping it  Aggressive goal LDL-C <55 due to several risk factors, ApoB <70- will depend on who aggressive patient wants to be  Does not get any exercise  LDL-C was 68 in April, TG were high 250  Last A1C 6.9  Plan  Since labs are from April, will get updated labs  Will check lipid panel and ApoB  Will discuss with patient after labs result, could consider adding ezetimibe  I will send a note to Oren Section to see if she can get patient into clinic for follow up. I think he could benefit from a CGM  I have asked him to check his blood sugar if he starts to feel warm/sweaty- explained that beta blockers block all other s/sx of low blood sugar.

## 2021-12-18 NOTE — Assessment & Plan Note (Signed)
Assessment:  Patient complains of nightmares since his TAVR  BB, benzos or statins have all been linked to this  Patient was on all prior to surgery  He appears to also suffer from a lot of anxiety Plan  I strongly urged patient to see a physcologist

## 2021-12-18 NOTE — Assessment & Plan Note (Signed)
Assessment:  Patient complains of frequent urination at night  He drinks diet soda at dinner and lots of fluid  Patient on doxazosin  Plan:   Decrease fluids at night. No soda at dinner and no water except to take meds after dinner  Follow up with urology

## 2021-12-23 ENCOUNTER — Ambulatory Visit: Payer: Medicare Other | Attending: Cardiovascular Disease

## 2021-12-23 DIAGNOSIS — E785 Hyperlipidemia, unspecified: Secondary | ICD-10-CM

## 2021-12-23 DIAGNOSIS — G459 Transient cerebral ischemic attack, unspecified: Secondary | ICD-10-CM

## 2021-12-24 LAB — LIPID PANEL
Chol/HDL Ratio: 3.9 ratio (ref 0.0–5.0)
Cholesterol, Total: 152 mg/dL (ref 100–199)
HDL: 39 mg/dL — ABNORMAL LOW (ref >39)
LDL Chol Calc (NIH): 88 mg/dL (ref 0–99)
Triglycerides: 143 mg/dL (ref 0–149)
VLDL Cholesterol Cal: 25 mg/dL (ref 5–40)

## 2021-12-24 LAB — HEPATIC FUNCTION PANEL
ALT: 12 IU/L (ref 0–44)
AST: 14 IU/L (ref 0–40)
Albumin: 4 g/dL (ref 3.8–4.8)
Alkaline Phosphatase: 51 IU/L (ref 44–121)
Bilirubin Total: 0.3 mg/dL (ref 0.0–1.2)
Bilirubin, Direct: <0.1 mg/dL (ref 0.00–0.40)
Total Protein: 6.4 g/dL (ref 6.0–8.5)

## 2021-12-24 LAB — APOLIPOPROTEIN B: Apolipoprotein B: 73 mg/dL (ref <90)

## 2021-12-26 ENCOUNTER — Telehealth: Payer: Self-pay

## 2021-12-26 ENCOUNTER — Telehealth: Payer: Self-pay | Admitting: Cardiovascular Disease

## 2021-12-26 DIAGNOSIS — Z79899 Other long term (current) drug therapy: Secondary | ICD-10-CM

## 2021-12-26 DIAGNOSIS — E785 Hyperlipidemia, unspecified: Secondary | ICD-10-CM

## 2021-12-26 MED ORDER — EZETIMIBE 10 MG PO TABS: 10.0000 mg | ORAL_TABLET | Freq: Every day | ORAL | 3 refills | Status: DC

## 2021-12-26 NOTE — Telephone Encounter (Signed)
Patient returned RN's call regarding results. 

## 2021-12-26 NOTE — Telephone Encounter (Signed)
Returned call to patient to review lab  results and recommendations. He feels like he has been prescribed zetia before in the past, but not 100%sure and I don't see in his history in the chart. States he's willing to try it. Medication sent to pharmacy on file, labs ordered and scheduled for 03/30/21.

## 2021-12-26 NOTE — Telephone Encounter (Signed)
-----   Message from Thayer Headings, MD sent at 12/24/2021 10:09 AM EDT ----- LDL is mildly elevated at 88.  His goal is 55  Please add Zetia 10 mg a day to his rosuvastatin  Recheck lipids , ALT in 3 months

## 2022-01-14 ENCOUNTER — Other Ambulatory Visit: Payer: Self-pay | Admitting: Cardiovascular Disease

## 2022-01-28 ENCOUNTER — Other Ambulatory Visit: Payer: Self-pay | Admitting: Cardiovascular Disease

## 2022-02-05 ENCOUNTER — Other Ambulatory Visit: Payer: Self-pay | Admitting: Cardiovascular Disease

## 2022-02-05 NOTE — Telephone Encounter (Signed)
Rx refill sent to pharmacy. 

## 2022-02-19 ENCOUNTER — Telehealth: Payer: Self-pay | Admitting: Cardiovascular Disease

## 2022-02-19 NOTE — Telephone Encounter (Signed)
Pt c/o medication issue:  1. Name of Medication: sitaGLIPtin (JANUVIA) 100 MG tablet   2. How are you currently taking this medication (dosage and times per day)? As prescribed   3. Are you having a reaction (difficulty breathing--STAT)? No  4. What is your medication issue? Patient is calling stating a pharmacist that saw him yesterday at [facility] is wanting to stop this medication and start him on Farxiga 10 mg. Patient is wanting Dr. Elmarie Shiley opinion before picking up new medication. Please advise.

## 2022-02-20 NOTE — Telephone Encounter (Signed)
Returned call to patient. Per OV notes on 02/18/22 by Etta Grandchild at [facility]: 02/18/2022 Type 2 diabetes mellitus with other diabetic kidney complication (OFB-51 - W25.85)   A1C up a little - exacerbated by recent COVID infection and holiday eating  Stop [employer] '10mg'$  daily. Wilder Glade has added benefit to reduce hospitalization for HF and reduce CV events  The new medication prescribed during this encounter and instructions of its use have been discussed with the patient/family and they have verbalized their understanding.   Continue Lantus 70 units daily  Counseled on nutrition and hydration goals  Counseled on benefit of routine physical activity as able.     Spoke with patient and informed him that yes, we would agree that taking Wilder Glade would be a good idea. Drugs in this class have shown wonderful data in helping HF patients and reducing cardiovascular events in patients with CAD. He agrees and appreciates call.

## 2022-03-01 ENCOUNTER — Other Ambulatory Visit: Payer: Self-pay | Admitting: Cardiovascular Disease

## 2022-03-30 ENCOUNTER — Ambulatory Visit: Payer: Medicare Other | Attending: Cardiology

## 2022-03-30 DIAGNOSIS — E785 Hyperlipidemia, unspecified: Secondary | ICD-10-CM

## 2022-03-30 DIAGNOSIS — Z79899 Other long term (current) drug therapy: Secondary | ICD-10-CM

## 2022-03-31 LAB — LIPID PANEL
Chol/HDL Ratio: 3.3 ratio (ref 0.0–5.0)
Cholesterol, Total: 135 mg/dL (ref 100–199)
HDL: 41 mg/dL (ref >39)
LDL Chol Calc (NIH): 72 mg/dL (ref 0–99)
Triglycerides: 121 mg/dL (ref 0–149)
VLDL Cholesterol Cal: 22 mg/dL (ref 5–40)

## 2022-03-31 LAB — ALT: ALT: 12 IU/L (ref 0–44)

## 2022-04-08 ENCOUNTER — Telehealth: Payer: Self-pay | Admitting: Cardiovascular Disease

## 2022-04-08 ENCOUNTER — Ambulatory Visit: Payer: Medicare Other | Attending: Internal Medicine

## 2022-04-08 DIAGNOSIS — R002 Palpitations: Secondary | ICD-10-CM

## 2022-04-08 DIAGNOSIS — I493 Ventricular premature depolarization: Secondary | ICD-10-CM

## 2022-04-08 NOTE — Telephone Encounter (Signed)
Pt reports HRs jumping all around, one minute it is normal and the next it is in the 30s. Currently:  98 O2 98, HR 88 Pt does not feel this is PVCs  Sometimes feeling a little dizzy Reports that he is "coughing" as advised by MD to try and stop the PVC. He does report that this is  different from the PVCs he has had in the past. He has f/u w/ Dr. Acie Fredrickson on 2/20. Pt informed that I am going to discuss a plan w/ MD and will call him back shortly.  Pt agreeable to plan.

## 2022-04-08 NOTE — Progress Notes (Unsigned)
ZIO XT [id number] from office inventory applied to patient.

## 2022-04-08 NOTE — Telephone Encounter (Signed)
STAT if HR is under 50 or over 120 (normal HR is 60-100 beats per minute)  What is your heart rate? 35   Do you have a log of your heart rate readings (document readings)? Yes   Do you have any other symptoms? No

## 2022-04-08 NOTE — Telephone Encounter (Signed)
Pt advised Dr. Acie Fredrickson recommends 3 day monitor prior to upcoming OV. Pt will come by office this afternoon to have monitor placed. Aware will discuss findings at his OV on 2/20. Patient verbalized understanding and agreeable to plan.

## 2022-04-20 ENCOUNTER — Encounter: Payer: Self-pay | Admitting: Cardiovascular Disease

## 2022-04-20 NOTE — Progress Notes (Unsigned)
Cardiology Office Note   Date:  04/21/2022   ID:  Alexis Griffith, Alexis Griffith [birth year], MRN [id number]  PCP:  Haywood Pao, MD  Cardiologist:   Mertie Moores, MD   Chief Complaint  Patient presents with   Coronary Artery Disease        TAVR    Problem List  1. Premature ventricular contractions 2. Anxiety 3. Hypertension 4. Hyperlipidemia 5. 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 Bilateral carotid artery disease - moderate 7. 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 TIA 9.  Aortic stenosis:   S/p TAVR      Alexis Griffith is a 82 y.o. male who presents for further evaluation of his palpitations and HTN.  He is having some muscle aches - especially in the morning.  Seems to be bilateral shoulder pain. He has held the pravachol for several weeks but the pain did not resolved. BP has been a little high. Tried taking 1 1/2 of the Losartan / HCTZ and has occasionally takes extra metoprolol.  September 20, 2014:  Alexis Griffith is doing well.  BP has been well controlled.  Tried a dose of Valsartan and got dizzy, he went back to Losartan after that and has done well.  He needs to have cataract surgery .   Jan. 30, 2017: Doing well  He did not have his cataract surgery as scheduled.    Recent labs look good Aches and pains but otherwise doing ok  Aug. 16, 2017:  Doing ok BP has been well controlled.  Having some left shoulder problems   - hurts at night. 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  No CP ,   Still has occasional elevated HTN  Sept. 25, 2018: Doing well.  Is followed by [name] for a past CVA and is on Aggrenox ( flagged the ASA dose alert )   Jul 13, 2017:   Alexis Griffith is doing well. Is not exercising  Is having some balance issues.   No CP  Still stress in his life.   PVCs seem to be well controlled.  Is taking metoprolol 50 mg TID   April 12, 2018:  No CP , no dyspnea.  Palpitations are under control   May 24, 2019: Alexis Griffith is seen today for follow-up of his hypertension, palpitations, mild aortic stenosis, hyperlipidemia, anxiety. Has rare episodes of chest pain - randomly  No exercising regularly at all.    Jul 04, 2019:  Alexis Griffith is seen today.  He had complained of chest pain at his last office visit . 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  Ct also showd a 4 mm pulmonary nodule.  He is here to discuss cath .  The cp is on and off.   At times its related to exertion He is not exercising regularly .   Sept. 20, 2021 Alexis Griffith is s/p TAVR since I last saw him. Also recent hospitalization for sepsis, syncope   Hx of CAD, AS, HLD, DM, ? Left renal mass ( ? Cancer )   On Aug. 20 he was admitted with syncope.  Had an indwelling foley develped sepsis from urosepsis  Work up also identified pulmonary nodules.  Has an indwelling foley catheter.   Due to BPH, Also has a left renal mass.    Oct. 28, 2021: Alexis Griffith is seen for follow up of his recent TAVR. He has CAD.  Has been treated medically .   he was recently admitted with cp. He ruled out for MI He was found to be hypertensive - he had skipped several of his meds that AM. Still has foley catheter.  Was at the hospital recently , was given hydralizine - has never started  Also was changed form losartan  to losartan but he also did not make that change either.  Nov. 22, 2021: Alexis Griffith is seen for recent worsening of leg edema Hx of CAD, CHF, TAVR, renal cell CA His BP has been on the low side Will DC Losartan HCT and have him take Losartan 100 mg a day goig forward Will give him a separate script for HCTZ at some point but only if he needs it   Feb. 4, 2022 Alexis Griffith is seen today for follow up of his TAVR, CHF, CAD He continues to have issues with syncope as well as HTN Was [facility] 26 for near syncope.  He had been out shopping with his wife,  Got dizzy.   Thought he  was going to pass out. Walked out and felt better.   Thinks he may have just had an anxiety attack Troponin was found to be 25. , 31 Was seen by Dr. Loletha Grayer.  His metoprolol was increased to 100 mg BID He found his HR to be in the low 50 so he did not continue the high dose  Now is back on metoprolol 50 BID .  He avoids salt  His BP tends to increase in the evingings and at night  Will add amlodipine 2.5 at noon .  He will cont to measure BP   Feb. 18, 2022:  Alexis Griffith is seen today for some increasing cp. He has known coronary artery disease.  He also has had some hypertension recently.  We started him on amlodipine 2.5 mg at his last office visit. Has been given a script for Lexipro  Still has CP,  NTG  Does not help it Radiates to his left scapula .    Dull achy , constant pain .   All day long  His pain is not related to exercise, eating or drinking or change of position.   August 02, 2020: Alexis Griffith is seen today for follow up of his CAD and AS - s/p TAVR NYHA class II-III ( does not do much )   KCCQ score  -   BP is ok Still has some chest wall pain , Not much angina Advised him to exercise - needs to get out and walk    Feb. 20, 2023  Alexis Griffith is seen today for follow up for his CAD , carotid artery disease, chronic diastolic CHF  He is awake every hour or so Will be doing a sleep study next month  Occasional CP  Is not having to take the NTG as much . Not much exercise   He was having lots of leg swelling - was due to amlodipine . He has stopped the amlodipine and his leg swelling has resolved.    Aug. 22, 2023 Alexis Griffith is seen for follow up of his CAD, CArotid artery disease, TAVR,  chronic diastolic CHF  He is having lots of intense flushing around 4 PM every day.  He thinks it might be due to the isosorbide.  I agree that we should decrease the dose back to results we put in 30 mg a day.  Has moderate R ICA  stenosis  Mild L  ICA stenosis  Informed that he is stopped taking a  statin because of diffuse muscle aches.  We will refer him to the lipid clinic for consideration of a PCSK9 inhibitor.  Feb. 20, 2024 Alexis Griffith is seen for follow up of his CAD, carotid disease, TAVR  Echo in August 02, 2020 showed EF 60%.  Normally functioning TAVR   Has an event monitor ordered,  results are pending      Past Medical History:  Diagnosis Date   Anxiety    Bladder neck obstruction 06/05/2013   CAD (coronary artery disease)    Carotid artery disease (HCC)    CTA 08/2019 of the head and neck show moderate 65 to 75% bilateral ICA stenoses    Chronic diastolic CHF (congestive heart failure) (HCC)    Diabetes mellitus type 2 in nonobese (HCC)    Excessive urination at night 06/05/2013   Giant cell arteritis ([place]) 05/17/2012   Hyperlipidemia    Hypertension    Hypertensive urgency    Hypomagnesemia 09/11/2019   Mild anemia 09/11/2019   NSTEMI (non-ST elevated myocardial infarction) ([place]) 09/09/2019   Orthostatic hypotension 09/11/2019   Premature atrial contractions 09/12/2019   Pulmonary nodules    PVC (premature ventricular contraction)    Renal mass    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 Burt Knack & Dr. Cyndia Bent   Severe aortic stenosis    Sinus bradycardia on ECG    Sleep apnea    Squamous cell carcinoma of tongue [facility]) September 2012   Followed by Dr. Wilburn Cornelia.   Stroke [facility])    TIA (transient ischemic attack)    Urinary retention 09/11/2019    Past Surgical History:  Procedure Laterality Date   CATARACT EXTRACTION, BILATERAL  10/2015, and 11/2015   with adding  TORIC lenses bilaterally    INTRAVASCULAR PRESSURE WIRE/FFR STUDY N/A 07/07/2019   Procedure: INTRAVASCULAR PRESSURE WIRE/FFR STUDY;  Surgeon: [name], MD;  Location: [facility] [facility] CV LAB;  Service: Cardiovascular;  Laterality: N/A;   LEFT HEART CATH AND CORONARY ANGIOGRAPHY N/A 07/07/2019   Procedure: LEFT HEART CATH AND CORONARY  ANGIOGRAPHY;  Surgeon: [name], MD;  Location: [facility] CV LAB;  Service: Cardiovascular;  Laterality: N/A;   SQUAMOUS CELL CARCINOMA EXCISION     tongue   TEE WITHOUT CARDIOVERSION N/A 08/15/2019   Procedure: TRANSESOPHAGEAL ECHOCARDIOGRAM (TEE);  Surgeon: Sherren Mocha, MD;  Location: [facility];  Service: Open Heart Surgery;  Laterality: N/A;   TRANSCATHETER AORTIC VALVE REPLACEMENT, TRANSFEMORAL N/A 08/15/2019   Procedure: TRANSCATHETER AORTIC VALVE REPLACEMENT, TRANSFEMORAL using Edwards [employer] SAPIEN 3 Ultra 26 mm THV.;  Surgeon: Sherren Mocha, MD;  Location: [facility];  Service: Open Heart Surgery;  Laterality: N/A;     Current Outpatient Medications  Medication Sig Dispense Refill   acetaminophen (TYLENOL) 325 MG tablet Take 650 mg by mouth every 6 (six) hours as needed for mild pain.     aspirin 81 MG chewable tablet Chew 1 tablet (81 mg total) by mouth daily. 360 tablet 0   Choline Fenofibrate (FENOFIBRIC ACID) 135 MG CPDR Take 1 capsule by mouth daily.     clonazePAM (KLONOPIN) 0.5 MG tablet Take 0.5 mg by mouth daily.     doxazosin (CARDURA) 4 MG tablet TAKE ONE TABLET BY MOUTH DAILY 90 tablet 3   glucose blood test strip 1 each by Other route as needed.      insulin  glargine (LANTUS) 100 UNIT/ML injection Inject 70 Units into the skin daily.     isosorbide mononitrate (IMDUR) 60 MG 24 hr tablet Take 1 tablet (60 mg total) by mouth daily. 90 tablet 3   Lancets (ONETOUCH DELICA PLUS 123XX123) MISC 1 each by Other route daily.      losartan (COZAAR) 100 MG tablet TAKE 1 TABLET by mouth ONCE DAILY. 90 tablet 2   magnesium oxide (MAG-OX) 400 MG tablet Take 400 mg by mouth daily.     metoprolol tartrate (LOPRESSOR) 50 MG tablet Take 1 tablet (50 mg total) by mouth 2 (two) times daily. 270 tablet 3   nitroGLYCERIN (NITROSTAT) 0.4 MG SL tablet PLACE 1 TABLET UNDER THE TONGUE EVERY 5 MINUTES AS NEEDED FOR CHEST PAIN UP TO 3 DOSES 25 tablet 9   ONE TOUCH ULTRA TEST test strip 1  each by Other route daily.      ranolazine (RANEXA) 500 MG 12 hr tablet TAKE 1 TABLET BY MOUTH TWICE DAILY. 180 tablet 2   rosuvastatin (CRESTOR) 10 MG tablet TAKE 1 TABLET by mouth ONCE DAILY. 90 tablet 2   sitaGLIPtin (JANUVIA) 100 MG tablet Take 100 mg by mouth daily.     No current facility-administered medications for this visit.    Allergies:   Macrodantin [nitrofurantoin], Tape, Sulfa antibiotics, Lisinopril, Metformin hcl, Nutritional supplements, Nutritional supplements, Penicillins, Pioglitazone, and Sulfamethoxazole-trimethoprim    Social History:  The patient  reports that he quit smoking about 46 years ago. His smoking use included cigarettes. He has never used smokeless tobacco. He reports that he does not drink alcohol and does not use drugs.   Family History:  The patient's family history includes Heart attack in his father and mother; Heart disease in his father and mother.    ROS:   Noted in current history, otherwise review of systems is negative.   Physical Exam: Blood pressure (!) 150/50, pulse 78, height 5' 10"$  (1.778 m), weight 200 lb 9.6 oz (91 kg), SpO2 96 %.  HYPERTENSION CONTROL Vitals:   04/21/22 1429 04/21/22 1525  BP: (!) 172/68 (!) 150/50    The patient's blood pressure is elevated above target today.  In order to address the patient's elevated BP: A current anti-hypertensive medication was adjusted today.       GEN:  elderly male,   NAD , in no acute distress HEENT: Normal NECK: No JVD; bilat carotid bruits  LYMPHATICS: No lymphadenopathy CARDIAC: RRR  , occasional PVC, 2/6 systolic murmur  RESPIRATORY:  Clear to auscultation without rales, wheezing or rhonchi  ABDOMEN: Soft, non-tender, non-distended MUSCULOSKELETAL:  No edema; No deformity  SKIN: Warm and dry NEUROLOGIC:  Alert and oriented x 3     EKG:     Recent Labs: 03/30/2022: ALT 12    Lipid Panel    Component Value Date/Time   CHOL 135 03/30/2022 0822   TRIG 121  03/30/2022 0822   HDL 41 03/30/2022 0822   CHOLHDL 3.3 03/30/2022 0822   CHOLHDL 3.3 09/09/2019 0308   VLDL 14 09/09/2019 0308   LDLCALC 72 03/30/2022 0822      Wt Readings from Last 3 Encounters:  04/21/22 200 lb 9.6 oz (91 kg)  10/21/21 201 lb 9.6 oz (91.4 kg)  04/22/21 199 lb 12.8 oz (90.6 kg)      Other studies Reviewed: Additional studies/ records that were reviewed today include: . Review of the above records demonstrates:    ASSESSMENT AND PLAN:  1.  Coronary artery disease:  No angina     2.  Aortic stenosis-   he is status post TAVR.       3. Hypertension -BP is elevated,  in crease his metoprolol to 50 TID . He has lots of anxiety issues and his BP is quite variable .    He will continue to measure frequently   4. Hyperlipidemia -   stable     5. 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 Bilateral carotid artery disease -   Has mild to moderate carotid artery disease.       Current medicines are reviewed at length with the patient today.  The patient does not have concerns regarding medicines.  The following changes have been made:  no change   Disposition:     will see him in 6 months      Signed, Mertie Moores, MD  04/21/2022 5:43 PM    [facility] [address], [place], [place]  [address] Phone: [phone]; Fax: [phone]

## 2022-04-21 ENCOUNTER — Ambulatory Visit: Payer: Medicare Other | Attending: Cardiovascular Disease | Admitting: Cardiovascular Disease

## 2022-04-21 ENCOUNTER — Encounter: Payer: Self-pay | Admitting: Cardiovascular Disease

## 2022-04-21 VITALS — BP 150/50 | HR 78 | Ht 70.0 in | Wt 200.6 lb

## 2022-04-21 DIAGNOSIS — I10 Essential (primary) hypertension: Secondary | ICD-10-CM

## 2022-04-21 DIAGNOSIS — I5032 Chronic diastolic (congestive) heart failure: Secondary | ICD-10-CM | POA: Diagnosis not present

## 2022-04-21 DIAGNOSIS — I493 Ventricular premature depolarization: Secondary | ICD-10-CM

## 2022-04-21 DIAGNOSIS — I25118 Atherosclerotic heart disease of native coronary artery with other forms of angina pectoris: Secondary | ICD-10-CM

## 2022-04-21 MED ORDER — METOPROLOL TARTRATE 50 MG PO TABS: 50.0000 mg | ORAL_TABLET | Freq: Two times a day (BID) | ORAL | 3 refills | Status: DC

## 2022-04-21 MED ORDER — ISOSORBIDE MONONITRATE ER 60 MG PO TB24: 60.0000 mg | ORAL_TABLET | Freq: Every day | ORAL | 3 refills | Status: DC

## 2022-04-21 NOTE — Patient Instructions (Signed)
Medication Instructions:  Please increase your Isosorbide to 60 mg a day. Take Metoprolol 50 mg three times a day. Continue all other medications as listed.  *If you need a refill on your cardiac medications before your next appointment, please call your pharmacy*  Follow-Up: At [facility], you and your health needs are our priority.  As part of our continuing mission to provide you with exceptional heart care, we have created designated Provider Care Teams.  These Care Teams include your primary Cardiologist (physician) and Advanced Practice Providers (APPs -  Physician Assistants and Nurse Practitioners) who all work together to provide you with the care you need, when you need it.  We recommend signing up for the patient portal called "MyChart".  Sign up information is provided on this After Visit Summary.  MyChart is used to connect with patients for Virtual Visits (Telemedicine).  Patients are able to view lab/test results, encounter notes, upcoming appointments, etc.  Non-urgent messages can be sent to your provider as well.   To learn more about what you can do with MyChart, go to [email or web address].    Your next appointment:   6 month(s)  Provider:   Mertie Moores, MD

## 2022-04-24 ENCOUNTER — Telehealth: Payer: Self-pay | Admitting: Cardiovascular Disease

## 2022-04-24 MED ORDER — METOPROLOL TARTRATE 50 MG PO TABS: ORAL_TABLET | ORAL | 3 refills | Status: DC

## 2022-04-24 NOTE — Telephone Encounter (Signed)
Per OV note on 04/21/22:  3. Hypertension -BP is elevated,  in crease his metoprolol to 50 TID . He has lots of anxiety issues and his BP is quite variable .    He will continue to measure frequently   Patient states he needs an Rx sent to his pharmacy stating the three times daily dosing. Notes that he doesn't always take it three times daily, but would like it sent in so he doesn't have issues with insurance company.

## 2022-09-29 ENCOUNTER — Ambulatory Visit (HOSPITAL_COMMUNITY)
Admission: RE | Admit: 2022-09-29 | Discharge: 2022-09-29 | Disposition: A | Discharge status: Home / Self Care | Payer: Medicare Other | Source: Ambulatory Visit | Attending: Cardiovascular Disease | Admitting: Cardiovascular Disease

## 2022-09-29 DIAGNOSIS — I779 Disorder of arteries and arterioles, unspecified: Secondary | ICD-10-CM | POA: Diagnosis present

## 2022-09-29 DIAGNOSIS — I6521 Occlusion and stenosis of right carotid artery: Secondary | ICD-10-CM | POA: Insufficient documentation

## 2022-09-30 ENCOUNTER — Telehealth: Payer: Self-pay

## 2022-09-30 DIAGNOSIS — I6523 Occlusion and stenosis of bilateral carotid arteries: Secondary | ICD-10-CM

## 2022-09-30 NOTE — Telephone Encounter (Signed)
Spoke with patient who verbalized understanding of results. Repeat carotid duplex ordered for next year at this time.

## 2022-09-30 NOTE — Telephone Encounter (Signed)
-----   Message from Kristeen Miss sent at 09/29/2022  5:54 PM EDT ----- Right :  moderate carotid disease Left :  mild carotid disease  Cont lipid lowering meds . Low chol diet  His LDL goal is < 70   Repeat in 1 year

## 2022-10-07 ENCOUNTER — Other Ambulatory Visit: Payer: Self-pay | Admitting: Cardiovascular Disease

## 2022-10-19 ENCOUNTER — Encounter: Payer: Self-pay | Admitting: Cardiovascular Disease

## 2022-10-19 NOTE — Progress Notes (Unsigned)
Cardiology Office Note   Date:  10/20/2022   ID:  Alexis Griffith, Alexis Griffith 1940-08-30, MRN [id number]  PCP:  Alexis Garbe, MD  Cardiologist:   Alexis Miss, MD   Chief Complaint  Patient presents with   Coronary Artery Disease        Palpitations   Problem List  1. Premature ventricular contractions 2. Anxiety 3. Hypertension 4. Hyperlipidemia 5. 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 Bilateral carotid artery disease - moderate 7. 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 TIA 9.  Aortic stenosis:   S/p TAVR      Alexis Griffith is a 82 y.o. male who presents for further evaluation of his palpitations and HTN.  He is having some muscle aches - especially in the morning.  Seems to be bilateral shoulder pain. He has held the pravachol for several weeks but the pain did not resolved. BP has been a little high. Tried taking 1 1/2 of the Losartan / HCTZ and has occasionally takes extra metoprolol.  September 20, 2014:  Alexis Griffith is doing well.  BP has been well controlled.  Tried a dose of Valsartan and got dizzy, he went back to Losartan after that and has done well.  He needs to have cataract surgery .   Jan. 30, 2017: Doing well  He did not have his cataract surgery as scheduled.    Recent labs look good Aches and pains but otherwise doing ok  Aug. 16, 2017:  Doing ok BP has been well controlled.  Having some left shoulder problems   - hurts at night. 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  No CP ,   Still has occasional elevated HTN  Sept. 25, 2018: Doing well.  Is followed by Alexis Griffith for a past CVA and is on Aggrenox ( flagged the ASA dose alert )   Jul 13, 2017:   Alexis Griffith is doing well. Is not exercising  Is having some balance issues.   No CP  Still stress in his life.   PVCs seem to be well controlled.  Is taking metoprolol 50 mg TID   April 12, 2018:  No CP , no dyspnea.  Palpitations are under control   May 24, 2019: Alexis Griffith is seen today for follow-up of his hypertension, palpitations, mild aortic stenosis, hyperlipidemia, anxiety. Has rare episodes of chest pain - randomly  No exercising regularly at all.    Jul 04, 2019:  Alexis Griffith is seen today.  He had complained of chest pain at his last office visit . 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  Ct also showd a 4 mm pulmonary nodule.  He is here to discuss cath .  The cp is on and off.   At times its related to exertion He is not exercising regularly .   Sept. 20, 2021 Alexis Griffith is s/p TAVR since I last saw him. Also recent hospitalization for sepsis, syncope   Hx of CAD, AS, HLD, DM, ? Left renal mass ( ? Cancer )   On Aug. 20 he was admitted with syncope.  Had an indwelling foley develped sepsis from urosepsis  Work up also identified pulmonary nodules.  Has an indwelling foley catheter.   Due to BPH, Also has a left renal mass.    Oct. 28, 2021: Alexis Griffith is seen for follow up of his recent TAVR. He has CAD. Has  been treated medically .   he was recently admitted with cp. He ruled out for MI He was found to be hypertensive - he had skipped several of his meds that AM. Still has foley catheter.  Was at the hospital recently , was given hydralizine - has never started  Also was changed form losartan  to losartan but he also did not make that change either.  Nov. 22, 2021: Alexis Griffith is seen for recent worsening of leg edema Hx of CAD, CHF, TAVR, renal cell CA His BP has been on the low side Will DC Losartan HCT and have him take Losartan 100 mg a day goig forward Will give him a separate script for HCTZ at some point but only if he needs it   Feb. 4, 2022 Alexis Griffith is seen today for follow up of his TAVR, CHF, CAD He continues to have issues with syncope as well as HTN Was [facility] for near syncope.  He had been out shopping with his wife,  Got dizzy.   Thought he  was going to pass out. Walked out and felt better.   Thinks he may have just had an anxiety attack Troponin was found to be 25. , 29 Was seen by Dr. Salena Griffith.  His metoprolol was increased to 100 mg BID He found his HR to be in the low 50 so he did not continue the high dose  Now is back on metoprolol 50 BID .  He avoids salt  His BP tends to increase in the evingings and at night  Will add amlodipine 2.5 at noon .  He will cont to measure BP   Feb. 18, 2022:  Alexis Griffith is seen today for some increasing cp. He has known coronary artery disease.  He also has had some hypertension recently.  We started him on amlodipine 2.5 mg at his last office visit. Has been given a script for Lexipro  Still has CP,  NTG  Does not help it Radiates to his left scapula .    Dull achy , constant pain .   All day long  His pain is not related to exercise, eating or drinking or change of position.   August 02, 2020: Alexis Griffith is seen today for follow up of his CAD and AS - s/p TAVR NYHA class II-III ( does not do much )   KCCQ score  -   BP is ok Still has some chest wall pain , Not much angina Advised him to exercise - needs to get out and walk    Feb. 20, 2023  Alexis Griffith is seen today for follow up for his CAD , carotid artery disease, chronic diastolic CHF  He is awake every hour or so Will be doing a sleep study next month  Occasional CP  Is not having to take the NTG as much . Not much exercise   He was having lots of leg swelling - was due to amlodipine . He has stopped the amlodipine and his leg swelling has resolved.    Aug. 22, 2023 Alexis Griffith is seen for follow up of his CAD, CArotid artery disease, TAVR,  chronic diastolic CHF  He is having lots of intense flushing around 4 PM every day.  He thinks it might be due to the isosorbide.  I agree that we should decrease the dose back to results we put in 30 mg a day.  Has moderate R ICA  stenosis  Mild L ICA  stenosis  Informed that he is stopped taking a  statin because of diffuse muscle aches.  We will refer him to the lipid clinic for consideration of a PCSK9 inhibitor.  Feb. 20, 2024 Alexis Griffith is seen for follow up of his CAD, carotid disease, TAVR  Echo in August 02, 2020 showed EF 60%.  Normally functioning TAVR   Has an event monitor ordered,  results are pending   October 20, 2022: Alexis Griffith is seen today for follow-up of his TAVR, coronary artery disease, chronic diastolic congestive heart failure, hyperlipidemia.  Has renal cell ca .    Lipids from Jan, 2024  Chol is 135 HDL is 41 LDL is 72     Past Medical History:  Diagnosis Date   Anxiety    Bladder neck obstruction 06/05/2013   CAD (coronary artery disease)    Carotid artery disease (HCC)    CTA 08/2019 of the head and neck show moderate 65 to 75% bilateral ICA stenoses    Chronic diastolic CHF (congestive heart failure) (HCC)    Diabetes mellitus type 2 in nonobese (HCC)    Excessive urination at night 06/05/2013   Giant cell arteritis (HCC) 05/17/2012   Hyperlipidemia    Hypertension    Hypertensive urgency    Hypomagnesemia 09/11/2019   Mild anemia 09/11/2019   NSTEMI (non-ST elevated myocardial infarction) (HCC) 09/09/2019   Orthostatic hypotension 09/11/2019   Premature atrial contractions 09/12/2019   Pulmonary nodules    PVC (premature ventricular contraction)    Renal mass    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 Excell Seltzer & Dr. Laneta Simmers   Severe aortic stenosis    Sinus bradycardia on ECG    Sleep apnea    Squamous cell carcinoma of tongue [facility]) September 2012   Followed by Dr. Annalee Genta.   Stroke [facility])    TIA (transient ischemic attack)    Urinary retention 09/11/2019    Past Surgical History:  Procedure Laterality Date   CATARACT EXTRACTION, BILATERAL  10/2015, and 11/2015   with adding  TORIC lenses bilaterally    CORONARY PRESSURE/FFR STUDY N/A 07/07/2019   Procedure: INTRAVASCULAR  PRESSURE WIRE/FFR STUDY;  Surgeon: Marykay Lex, MD;  Location: [facility] INVASIVE CV LAB;  Service: Cardiovascular;  Laterality: N/A;   LEFT HEART CATH AND CORONARY ANGIOGRAPHY N/A 07/07/2019   Procedure: LEFT HEART CATH AND CORONARY ANGIOGRAPHY;  Surgeon: Marykay Lex, MD;  Location: [facility] INVASIVE CV LAB;  Service: Cardiovascular;  Laterality: N/A;   SQUAMOUS CELL CARCINOMA EXCISION     tongue   TEE WITHOUT CARDIOVERSION N/A 08/15/2019   Procedure: TRANSESOPHAGEAL ECHOCARDIOGRAM (TEE);  Surgeon: Tonny Bollman, MD;  Location: [facility] OR;  Service: Open Heart Surgery;  Laterality: N/A;   TRANSCATHETER AORTIC VALVE REPLACEMENT, TRANSFEMORAL N/A 08/15/2019   Procedure: TRANSCATHETER AORTIC VALVE REPLACEMENT, TRANSFEMORAL using Edwards [employer] SAPIEN 3 Ultra 26 mm THV.;  Surgeon: Tonny Bollman, MD;  Location[facility] OR;  Service: Open Heart Surgery;  Laterality: N/A;     Current Outpatient Medications  Medication Sig Dispense Refill   acetaminophen (TYLENOL) 325 MG tablet Take 650 mg by mouth every 6 (six) hours as needed for mild pain.     aspirin 81 MG chewable tablet Chew 1 tablet (81 mg total) by mouth daily. 360 tablet 0   Choline Fenofibrate (FENOFIBRIC ACID) 135 MG CPDR Take 1 capsule by mouth daily.     clonazePAM (KLONOPIN) 0.5 MG tablet  Take 0.5 mg by mouth daily.     doxazosin (CARDURA) 4 MG tablet TAKE ONE TABLET BY MOUTH DAILY 90 tablet 3   glucose blood test strip 1 each by Other route as needed.      insulin glargine (LANTUS) 100 UNIT/ML injection Inject 70 Units into the skin daily.     isosorbide mononitrate (IMDUR) 60 MG 24 hr tablet Take 1 tablet (60 mg total) by mouth daily. 90 tablet 3   Lancets (ONETOUCH DELICA PLUS LANCET33G) MISC 1 each by Other route daily.      losartan (COZAAR) 100 MG tablet TAKE 1 TABLET by mouth ONCE DAILY. 90 tablet 2   magnesium oxide (MAG-OX) 400 MG tablet Take 400 mg by mouth daily.     metoprolol tartrate (LOPRESSOR) 50 MG tablet Take 1 tablet by  mouth up to three times daily 270 tablet 3   nitroGLYCERIN (NITROSTAT) 0.4 MG SL tablet PLACE 1 TABLET UNDER THE TONGUE EVERY 5 MINUTES AS NEEDED FOR CHEST PAIN UP TO 3 DOSES 25 tablet 9   ONE TOUCH ULTRA TEST test strip 1 each by Other route daily.      ranolazine (RANEXA) 500 MG 12 hr tablet TAKE 1 TABLET BY MOUTH TWICE DAILY. 180 tablet 2   rosuvastatin (CRESTOR) 10 MG tablet TAKE 1 TABLET by mouth ONCE DAILY. 90 tablet 1   sitaGLIPtin (JANUVIA) 100 MG tablet Take 100 mg by mouth daily.     No current facility-administered medications for this visit.    Allergies:   Macrodantin [nitrofurantoin], Tape, Sulfa antibiotics, Lisinopril, Metformin hcl, Nutritional supplements, Nutritional supplements, Penicillins, Pioglitazone, and Sulfamethoxazole-trimethoprim    Social History:  The patient  reports that he quit smoking about 47 years ago. His smoking use included cigarettes. He has never used smokeless tobacco. He reports that he does not drink alcohol and does not use drugs.   Family History:  The patient's family history includes Heart attack in his father and mother; Heart disease in his father and mother.    ROS:   Noted in current history, otherwise review of systems is negative.    Physical Exam: Blood pressure 135/65, pulse 65, height 5\' 10"  (1.778 m), weight 198 lb (89.8 kg), SpO2 96%.      GEN:  Well nourished, well developed in no acute distress HEENT: Normal NECK: No JVD; No carotid bruits LYMPHATICS: No lymphadenopathy CARDIAC: RRR , soft systolic murmur  RESPIRATORY:  Clear to auscultation without rales, wheezing or rhonchi  ABDOMEN: Soft, non-tender, non-distended MUSCULOSKELETAL:  No edema; No deformity  SKIN: Warm and dry NEUROLOGIC:  Alert and oriented x 3    EKG:     Recent Labs: 03/30/2022: ALT 12    Lipid Panel    Component Value Date/Time   CHOL 135 03/30/2022 0822   TRIG 121 03/30/2022 0822   HDL 41 03/30/2022 0822   CHOLHDL 3.3 03/30/2022  0822   CHOLHDL 3.3 09/09/2019 0308   VLDL 14 09/09/2019 0308   LDLCALC 72 03/30/2022 0822      Wt Readings from Last 3 Encounters:  10/20/22 198 lb (89.8 kg)  04/21/22 200 lb 9.6 oz (91 kg)  10/21/21 201 lb 9.6 oz (91.4 kg)      Other studies Reviewed: Additional studies/ records that were reviewed today include: . Review of the above records demonstrates:    ASSESSMENT AND PLAN:  1.  Coronary artery disease:     No angina .   Cont meds  Check labs today  2.  Aortic stenosis-    s/p TAVR .  Doing greast   3. Hypertension -  BP is well controlled.    4. Hyperlipidemia -    Check labs today , continue current meds.     Will see him in a     5. 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 Bilateral carotid artery disease -   Has mild to moderate carotid artery disease.  Recent duplex scan is stable      Current medicines are reviewed at length with the patient today.  The patient does not have concerns regarding medicines.  The following changes have been made:  no change   Disposition:           Signed, Alexis Miss, MD  10/20/2022 1:55 PM    [facility] [address] [place], [place], [place]  [address] Phone: [phone]; Fax: [phone]

## 2022-10-20 ENCOUNTER — Encounter: Payer: Self-pay | Admitting: Cardiovascular Disease

## 2022-10-20 ENCOUNTER — Ambulatory Visit: Payer: Medicare Other | Admitting: Cardiovascular Disease

## 2022-10-20 VITALS — BP 135/65 | HR 65 | Ht 70.0 in | Wt 198.0 lb

## 2022-10-20 DIAGNOSIS — Z952 Presence of prosthetic heart valve: Secondary | ICD-10-CM | POA: Diagnosis not present

## 2022-10-20 DIAGNOSIS — I6523 Occlusion and stenosis of bilateral carotid arteries: Secondary | ICD-10-CM

## 2022-10-20 DIAGNOSIS — I5032 Chronic diastolic (congestive) heart failure: Secondary | ICD-10-CM

## 2022-10-20 NOTE — Patient Instructions (Signed)
Medication Instructions:  Your physician recommends that you continue on your current medications as directed. Please refer to the Current Medication list given to you today.  *If you need a refill on your cardiac medications before your next appointment, please call your pharmacy*   Lab Work: Lipids, ALT, BMET today If you have labs (blood work) drawn today and your tests are completely normal, you will receive your results only by: [place] (if you have MyChart) OR A paper copy in the mail If you have any lab test that is abnormal or we need to change your treatment, we will call you to review the results.   Testing/Procedures: NONE   Follow-Up: At [facility], you and your health needs are our priority.  As part of our continuing mission to provide you with exceptional heart care, we have created designated Provider Care Teams.  These Care Teams include your primary Cardiologist (physician) and Advanced Practice Providers (APPs -  Physician Assistants and Nurse Practitioners) who all work together to provide you with the care you need, when you need it.  We recommend signing up for the patient portal called "MyChart".  Sign up information is provided on this After Visit Summary.  MyChart is used to connect with patients for Virtual Visits (Telemedicine).  Patients are able to view lab/test results, encounter notes, upcoming appointments, etc.  Non-urgent messages can be sent to your provider as well.   To learn more about what you can do with MyChart, go to [email or web address].    Your next appointment:   1 year(s)  Provider:   Mertie Moores, MD

## 2022-10-21 LAB — BASIC METABOLIC PANEL
BUN/Creatinine Ratio: 15 (ref 10–24)
BUN: 22 mg/dL (ref 8–27)
CO2: 24 mmol/L (ref 20–29)
Calcium: 10 mg/dL (ref 8.6–10.2)
Chloride: 101 mmol/L (ref 96–106)
Creatinine, Ser: 1.51 mg/dL — ABNORMAL HIGH (ref 0.76–1.27)
Glucose: 195 mg/dL — ABNORMAL HIGH (ref 70–99)
Potassium: 4.5 mmol/L (ref 3.5–5.2)
Sodium: 139 mmol/L (ref 134–144)
eGFR: 46 mL/min/{1.73_m2} — ABNORMAL LOW (ref >59)

## 2022-10-21 LAB — LIPID PANEL
Chol/HDL Ratio: 4.6 ratio (ref 0.0–5.0)
Cholesterol, Total: 156 mg/dL (ref 100–199)
HDL: 34 mg/dL — ABNORMAL LOW (ref >39)
LDL Chol Calc (NIH): 82 mg/dL (ref 0–99)
Triglycerides: 242 mg/dL — ABNORMAL HIGH (ref 0–149)
VLDL Cholesterol Cal: 40 mg/dL (ref 5–40)

## 2022-10-21 LAB — ALT: ALT: 10 IU/L (ref 0–44)

## 2022-10-27 ENCOUNTER — Other Ambulatory Visit: Payer: Self-pay | Admitting: Cardiovascular Disease

## 2023-01-19 ENCOUNTER — Other Ambulatory Visit: Payer: Self-pay | Admitting: Cardiovascular Disease

## 2023-04-08 ENCOUNTER — Other Ambulatory Visit: Payer: Self-pay | Admitting: Cardiovascular Disease

## 2023-04-12 ENCOUNTER — Telehealth: Payer: Self-pay | Admitting: Cardiovascular Disease

## 2023-04-12 NOTE — Telephone Encounter (Signed)
 Pt c/o swelling: STAT is pt has developed SOB within 24 hours  How much weight have you gained and in what time span?  No weight gain  If swelling, where is the swelling located?  Feet, ankles, legs. Mainly left leg.   Are you currently taking a fluid pill?  No   Are you currently SOB?  No   Do you have a log of your daily weights (if so, list)?   Have you gained 3 pounds in a day or 5 pounds in a week?   Have you traveled recently?  No

## 2023-04-12 NOTE — Telephone Encounter (Signed)
 Patient is requesting call back and update on message sent this morning in regard to swelling. Please advise.

## 2023-04-14 NOTE — Telephone Encounter (Signed)
Returned call to patient who states he's having BLE edema from foot to knee, L worse than R. States it began 4-5 weeks ago. Seen by PCP on 03/24/23 and weight was 198.4lb. Pt states his weight now is still the same, fluctuates between 195-201, depending on what he eats. States that his urine is "really dark, brown colored for several weeks also." Says that the swelling is always there, but does recede some overnight. Sees Sammuel Hines for kidneys (cancer). Spoke with Dr Elease Hashimoto during clinic who states that patient should really see nephrology first before we can safely diurese him. Encouraged leg elevation and BP checks. Patient states he can get in quickly with nephrologist and is due for yearly CT scan soon anyway, so will reach out to them tomorrow. Appreciative of call back.

## 2023-04-14 NOTE — Telephone Encounter (Signed)
Pt calling back again due to not receiving a cb since Monday, requesting cb

## 2023-04-16 ENCOUNTER — Other Ambulatory Visit: Payer: Self-pay | Admitting: Cardiovascular Disease

## 2023-07-04 ENCOUNTER — Other Ambulatory Visit: Payer: Self-pay | Admitting: Cardiovascular Disease

## 2023-08-09 ENCOUNTER — Encounter: Payer: Self-pay | Admitting: Cardiovascular Disease

## 2023-08-09 NOTE — Telephone Encounter (Signed)
 error

## 2023-08-12 ENCOUNTER — Telehealth: Payer: Self-pay | Admitting: Cardiovascular Disease

## 2023-08-12 NOTE — Telephone Encounter (Signed)
   Pt c/o of Chest Pain: STAT if active CP, including tightness, pressure, jaw pain, radiating pain to shoulder/upper arm/back, CP unrelieved by Nitro. Symptoms reported of SOB, nausea, vomiting, sweating.  1. Are you having CP right now? no    2. Are you experiencing any other symptoms (ex. SOB, nausea, vomiting, sweating)? sweating   3. Is your CP continuous or coming and going? Coming and going   4. Have you taken Nitroglycerin ? yes   5. How long have you been experiencing CP? 2-3 months    6. If NO CP at time of call then end call with telling Pt to call back or call 911 if Chest pain returns prior to return call from triage team.

## 2023-08-12 NOTE — Telephone Encounter (Signed)
 Spoke with pt, he reports chest pain that comes and goes for the last 3-4 weeks. He gets the pain when at rest and he will take a NTG but it does not always take the pain away. He does not have any SOB, nausea or other symptoms related to the chest pain. He reports it does not feel like the pain he had prior to the valve replacement. He does get hot flashes and reports staying hot all the time but he thinks this is related to farxiga. Follow up scheduled and ER precautions discussed with the patient.

## 2023-08-18 ENCOUNTER — Encounter: Payer: Self-pay | Admitting: Physician Assistant

## 2023-08-18 ENCOUNTER — Ambulatory Visit: Attending: Physician Assistant | Admitting: Cardiology

## 2023-08-18 VITALS — BP 195/60 | HR 88 | Ht 70.0 in | Wt 192.4 lb

## 2023-08-18 DIAGNOSIS — Z952 Presence of prosthetic heart valve: Secondary | ICD-10-CM

## 2023-08-18 DIAGNOSIS — E782 Mixed hyperlipidemia: Secondary | ICD-10-CM

## 2023-08-18 DIAGNOSIS — I6523 Occlusion and stenosis of bilateral carotid arteries: Secondary | ICD-10-CM | POA: Diagnosis not present

## 2023-08-18 DIAGNOSIS — I25118 Atherosclerotic heart disease of native coronary artery with other forms of angina pectoris: Secondary | ICD-10-CM | POA: Diagnosis not present

## 2023-08-18 DIAGNOSIS — I10 Essential (primary) hypertension: Secondary | ICD-10-CM

## 2023-08-18 MED ORDER — CARVEDILOL 25 MG PO TABS: 25.0000 mg | ORAL_TABLET | Freq: Two times a day (BID) | ORAL | 3 refills | Status: AC

## 2023-08-18 NOTE — Progress Notes (Addendum)
 Cardiology Office Note:  .   Date:  08/18/2023  ID:  Alexis Griffith, DOB 1940-06-10, MRN [id number] PCP: Suzzanne Estrin, MD  [facility] Providers Cardiologist:  Ahmad Alert, MD Electrophysiologist:  Richardo Chandler, MD  Structural Heart:  Arnoldo Lapping, MD{  History of Present Illness: .   Alexis Griffith is a 83 y.o. 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  Evaluated at multidisciplinary valve team, proceeded with TAVR and medically treat CAD Status post TAVR 08/2019 Plavix  discontinued in the setting of gross hematuria Echocardiogram 07/2020 with normal device function, ABG had increased to from 6-10.  CAD LHC as above with multivessel disease.  Treated medically. NSTEMI 08/2019.  Recommended treating medically with recent issue with gross hematuria  Vasovagal syncope ER admission 10/2019 with chest pain and minimally elevated troponins, no ACS.  Heart monitor not completed. ER admission 03/2020 near syncopal episode thought related to anxiety and orthostatic hypotension. Most recent heart monitor to 2024 overall normal.  1 episode of NSVT 17 beats.  Carotid artery stenosis Duplex ultrasound 08/2022 with moderate disease in the right side, mild disease in the left side.    Patient has history of multivessel disease treated medically and TAVR in 2021. Patient recently contacted our office complaining of chest pain that had been going on for last 3 to 4 weeks at rest requiring nitroglycerin  and still not relieved.    Today patient presents for follow-up.  He  is accompanied with his wife today.  He reports that he has had chest pain that has been occurring mainly at night.  Not necessarily exertional and with no radiating symptoms.  Not accompanied with any nausea, vomiting, diaphoresis.  He admits the chest pain really is not much of a concern to him at all.  Unable to describe the characteristics of it though.  His bigger concern is he has had fatigue and decreased exercise capacity.  Feels that he is constantly tired all the time and without energy.  Also reports his blood pressure has been elevated in the 150s to 160s at home but checks his every day.  Reports that he has really bad nightmares and struggles to sleep on a daily basis and often cannot go back to bed.  Also said he missed one of his turns today and felt very anxious from this.  But even at the end of his visit he was still in the 190 systolic.  ROS: Denies: shortness of breath, orthopnea, peripheral edema, palpitations, decreased exercise intolerance, lightheadedness.  No significant headaches, visual disturbances or light sensitivity.  Studies Reviewed: Aaron Aas    EKG Interpretation Date/Time:  Wednesday August 18 2023 16:12:47 EDT Ventricular Rate:  84 PR Interval:  260 QRS Duration:  128 QT Interval:  374 QTC Calculation: 441 R Axis:   -26  Text Interpretation: Sinus rhythm with 1st degree A-V block Left ventricular hypertrophy with QRS widening and repolarization abnormality ( R in aVL , Sokolow-Lyon , Cornell product , Romhilt-Estes ) When compared with ECG of 18-Aug-2023 16:11, new LBBB. St depression laterally. lateral ST depressions. Confirmed by Morgan Arab [phone]) on  08/18/2023 4:52:50 PM    Risk Assessment/Calculations:         Physical Exam:   VS:  BP (!) 195/60   Pulse 88   Ht 5' 10 (1.778 m)   Wt 192 lb 6.4 oz (87.3 kg)   SpO2 94%   BMI 27.61 kg/m    Wt Readings from Last 3 Encounters:  08/18/23 192 lb 6.4 oz (87.3 kg)  10/20/22 198 lb (89.8 kg)  04/21/22 200 lb 9.6  oz (91 kg)    GEN: Well nourished, well developed in no acute distress NECK: No JVD; No carotid bruits CARDIAC: RRR, no murmurs, rubs, gallops RESPIRATORY:  Clear to auscultation without rales, wheezing or rhonchi  ABDOMEN: Soft, non-tender, non-distended EXTREMITIES:  No edema; No deformity   ASSESSMENT AND PLAN: .    Multivessel CAD LHC in 2021 with chronically occluded RCA and moderate to severe disease in the LAD and LCx which has previously been managed medically and evaluated before at multidisciplinary conference.  He presents with mild complaints of chest pain but bigger issue is his decreasing exercise capacity and fatigue.  EKG here shows new left bundle branch block with ST depressions laterally and Q waves in V4.  Obviously has evidence of obstructive CAD and signs of ischemia, however I do not feel that he is a great cardiac catheterization candidate. Also he is here with hypertensive urgency which may also be contributing/exacerbating.  Will need to get his blood pressure more controlled for clearer picture. Previously not able to tolerate DAPT therapy due to history of gross hematuria that occurred after his TAVR on Plavix . Obtaining echocardiogram for history of TAVR but if his EF is severely reduced this will need to be considered. Continue with aspirin , switching Lopressor  50 mg 3 times daily to carvedilol 25 mg twice daily, Imdur  60 mg, Ranexa  500 mg twice daily, rosuvastatin  10 mg daily. He has been given ER precautions for any persistent chest pain that is associated with nausea, vomiting, diaphoresis and radiating pain.  Status post TAVR 2021 Last echocardiogram 07/2020 with okay device function. Mean AVG had increased from 6 to , peak velocity increased from 175 to 231cm/s, DI has decreased from 0.55 to 0.47 . No perivalvular AI.  Overdue for echocardiogram, order today.  Hypertension urgency Blood pressure taken by myself is 195/60.  He reports being very anxious  today but at home still has uncontrolled blood pressure in the 150s to 160s. Continue with doxazosin  4 mg daily, Imdur  60 mg, losartan  100 mg, changing Lopressor  to carvedilol 25 mg twice daily.  He needs 2-week nurse visit to go over blood pressure log, take twice a day every day for the next 2 weeks. At upcoming appointment may consider switching to olmesartan .  Of note he did not tolerate amlodipine  declined being put on this again.  Reports that it made him feel bad. Getting CBC, BMP, UA with microalbumin.  Lab is closed today so he will have to get this done tomorrow. At follow-up may consider increasing Imdur , adding changing to olmesartan .  Hyperlipidemia LDL in January 2025 50.  Excellent control.  Goal less than 55 Continue with rosuvastatin  10 mg  Renal cell carcinoma Pulmonary nodules These were found incidentally on workup for TAVR.  It is unclear the severity of his renal cell carcinoma.  Reports that his urologist follows this?  Also said he was referred to oncology for further evaluation.  This also needs to be taken into consideration of catheterization candidacy.  Carotid artery disease Ultrasound 08/2022 with moderate disease in the right side, mild disease in the left side. Repeat carotid Dopplers  Nightmares Reports sleeping very poorly and frequently waking up in the middle the night unable to go to sleep.  Needs to follow-up with his PCP    Dispo: 2-week nurse visit to go over blood pressure.  Follow-up with me 4 weeks after his echocardiogram results.  I have already scheduled this myself.  Also overdue for routine labs check CBC, BMP with hypertensive urgency.  He will transition care to Dr. Swaziland.  Plan reviewed with him and agreeable.  Signed, Burnetta Cart, PA-C

## 2023-08-18 NOTE — Patient Instructions (Signed)
 Medication Instructions:  STOP METOPROLOL  TARTRATE  START CARVEDILOL 25 MG TWICE A DAY *If you need a refill on your cardiac medications before your next appointment, please call your pharmacy*  Lab Work: BMP,CBC,POCT UA-MICROALBUMIN WITHIN WEEK If you have labs (blood work) drawn today and your tests are completely normal, you will receive your results only by: MyChart Message (if you have MyChart) OR A paper copy in the mail If you have any lab test that is abnormal or we need to change your treatment, we will call you to review the results.  Testing/Procedures:[address]1ST AVAILABLE Your physician has requested that you have an echocardiogram. Echocardiography is a painless test that uses sound waves to create images of your heart. It provides your doctor with information about the size and shape of your heart and how well your heart's chambers and valves are working. This procedure takes approximately one hour. There are no restrictions for this procedure. Please do NOT wear cologne, perfume, aftershave, or lotions (deodorant is allowed). Please arrive 15 minutes prior to your appointment time.  Please note: We ask at that you not bring children with you during ultrasound (echo/ vascular) testing. Due to room size and safety concerns, children are not allowed in the ultrasound rooms during exams. Our front office staff cannot provide observation of children in our lobby area while testing is being conducted. An adult accompanying a patient to their appointment will only be allowed in the ultrasound room at the discretion of the ultrasound technician under special circumstances. We apologize for any inconvenience.   Follow-Up: At [facility], you and your health needs are our priority.  As part of our continuing mission to provide you with exceptional heart care, our providers are all part of one team.  This team includes your primary Cardiologist (physician) and Advanced  Practice Providers or APPs (Physician Assistants and Nurse Practitioners) who all work together to provide you with the care you need, when you need it.  Your next appointment:   1 month(s)  Provider:   Sylvester Evert Haley,PA-C/ Ervin Heath, PA-C   Other Instructions CHECK BLOOD PRESSURE 2 TIMES A DAY FOR 2 WEEKS. KEEP LOG OF READINGS IN BRING TO NURSE VISIT IN 2 WEEKS

## 2023-08-20 LAB — CBC
Hematocrit: 40.7 % (ref 37.5–51.0)
Hemoglobin: 13 g/dL (ref 13.0–17.7)
MCH: 25.7 pg — ABNORMAL LOW (ref 26.6–33.0)
MCHC: 31.9 g/dL (ref 31.5–35.7)
MCV: 80 fL (ref 79–97)
Platelets: 205 10*3/uL (ref 150–450)
RBC: 5.06 x10E6/uL (ref 4.14–5.80)
RDW: 14.4 % (ref 11.6–15.4)
WBC: 6.2 10*3/uL (ref 3.4–10.8)

## 2023-08-20 LAB — BASIC METABOLIC PANEL WITH GFR
BUN/Creatinine Ratio: 20 (ref 10–24)
BUN: 26 mg/dL (ref 8–27)
CO2: 20 mmol/L (ref 20–29)
Calcium: 10.2 mg/dL (ref 8.6–10.2)
Chloride: 102 mmol/L (ref 96–106)
Creatinine, Ser: 1.28 mg/dL — ABNORMAL HIGH (ref 0.76–1.27)
Glucose: 221 mg/dL — ABNORMAL HIGH (ref 70–99)
Potassium: 4.8 mmol/L (ref 3.5–5.2)
Sodium: 137 mmol/L (ref 134–144)
eGFR: 56 mL/min/{1.73_m2} — ABNORMAL LOW (ref >59)

## 2023-08-22 ENCOUNTER — Ambulatory Visit: Payer: Self-pay | Admitting: Cardiology

## 2023-08-27 ENCOUNTER — Telehealth: Payer: Self-pay

## 2023-08-27 NOTE — Telephone Encounter (Signed)
 Left message for patient to callback to schedule nurse visit for BP check next week.

## 2023-08-27 NOTE — Telephone Encounter (Signed)
-----   Message from Nurse Ronal SAUNDERS sent at 08/27/2023  8:22 AM EDT -----  ----- Message ----- From: Frederik Suzen RAMAN Sent: 08/26/2023  11:02 AM EDT To: Ronal SHAUNNA Huron, RN  Good Morning,  The scheduling department does not schedule nurse visits as we are not allowed. This will need to be a nurse or triage nurse to make this appointment.  Have a good day Kim ----- Message ----- From: Huron Ronal SHAUNNA, RN Sent: 08/24/2023   1:25 PM EDT To: Lurena Hershal [place] Scheduling  Can you please help with this? Thanks! ----- Message ----- From: Conny Charla DASEN, CMA Sent: 08/24/2023  12:16 PM EDT To: Lurena Hershal [facility] Triage  Can someone reach out to patient and schedule a nurse visit for blood pressure in 1 week now , thank you ----- Message ----- From: Frederik Suzen RAMAN Sent: 08/24/2023   9:25 AM EDT To: Charla DASEN Conny, CMA  Good Morning,   Nurses are the only ones that can schedule a nurse visit. We have the echo and 1 month f/u scheduled. ----- Message ----- From: Conny Charla DASEN, CMA Sent: 08/18/2023   5:05 PM EDT To: Thom LITTIE Sluder, PA-C; [facility] Schedul#  Patient needs to be scheduled for echocardiogram 1st available, nurse visit for blood pressure in 2 weeks and also 1 month follow up on Hao Meng, PA-C schedule 7/18 with Thom Sluder, PA-C per Sheng, thank you

## 2023-08-29 NOTE — Progress Notes (Unsigned)
 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  Diagnosis: stage IV RCC with lung metastases Assessment & Plan   No orders of the defined types were placed in this encounter.   The total time spent in the appointment was {CHL ONC TIME VISIT - [id number]} encounter with patients including review of chart and various tests results, discussions about plan of care and coordination of care plan   All questions were answered. The patient knows to call the clinic with any problems, questions or concerns. No barriers to learning was detected.  Pauletta BROCKS Chihuahua, MD 6/29/20259:30 PM  CHIEF COMPLAINTS/PURPOSE OF CONSULTATION:  ***  HISTORY OF PRESENTING ILLNESS:  Alexis Griffith 83 y.o. male is here because of *** I have reviewed his chart and materials related to his cancer extensively and collaborated history with the patient. Summary of oncologic history is as follows: Oncology History   No history exists.    MEDICAL HISTORY:  Past Medical History:  Diagnosis Date   Anxiety    Bladder neck obstruction 06/05/2013   CAD (coronary artery disease)    Carotid artery disease (HCC)    CTA 08/2019 of the head and neck show moderate 65 to 75% bilateral ICA stenoses    Chronic diastolic CHF (congestive heart failure) (HCC)    Diabetes mellitus type 2 in nonobese (HCC)    Excessive urination at night 06/05/2013   Giant  cell arteritis (HCC) 05/17/2012   Hyperlipidemia    Hypertension    Hypertensive urgency    Hypomagnesemia 09/11/2019   Mild anemia 09/11/2019   NSTEMI (non-ST elevated myocardial infarction) (HCC) 09/09/2019   Orthostatic hypotension 09/11/2019   Premature atrial contractions 09/12/2019   Pulmonary nodules    PVC (premature ventricular contraction)    Renal mass    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   s/p TAVR with 26 mm Edwards S3U via TF approach by Dr. Wonda & Dr. Lucas   Severe aortic stenosis    Sinus bradycardia on ECG    Sleep apnea    Squamous cell carcinoma of tongue [facility]) September 2012   Followed by Dr. Mable.   Stroke [facility])    TIA (transient ischemic attack)    Urinary retention 09/11/2019    SURGICAL HISTORY: Past Surgical History:  Procedure Laterality Date   CATARACT EXTRACTION, BILATERAL  10/2015, and 11/2015   with adding  TORIC lenses bilaterally    CORONARY PRESSURE/FFR STUDY N/A 07/07/2019   Procedure: INTRAVASCULAR PRESSURE WIRE/FFR STUDY;  Surgeon: Anner Alm ORN, MD;  Location: [facility] INVASIVE CV LAB;  Service: Cardiovascular;  Laterality: N/A;   LEFT HEART CATH AND CORONARY ANGIOGRAPHY N/A 07/07/2019   Procedure: LEFT HEART CATH AND CORONARY ANGIOGRAPHY;  Surgeon: Anner Alm ORN, MD;  Location: [facility] INVASIVE CV LAB;  Service: Cardiovascular;  Laterality: N/A;   SQUAMOUS CELL CARCINOMA EXCISION     tongue   TEE WITHOUT CARDIOVERSION N/A 08/15/2019   Procedure: TRANSESOPHAGEAL ECHOCARDIOGRAM (TEE);  Surgeon:  Wonda Sharper, MD;  Location: [facility] OR;  Service: Open Heart Surgery;  Laterality: N/A;   TRANSCATHETER AORTIC VALVE REPLACEMENT, TRANSFEMORAL N/A 08/15/2019   Procedure: TRANSCATHETER AORTIC VALVE REPLACEMENT, TRANSFEMORAL using Edwards [employer] SAPIEN 3 Ultra 26 mm THV.;  Surgeon: Wonda Sharper, MD;  Location: [facility] OR;  Service: Open Heart Surgery;  Laterality: N/A;    SOCIAL HISTORY: Social History   Socioeconomic History    Marital status: Married    Spouse name: Not on file   Number of children: 2   Years of education: Not on file   Highest education level: Not on file  Occupational History   Not on file  Tobacco Use   Smoking status: Former    Current packs/day: 0.00    Types: Cigarettes    Quit date: 06/25/1975    Years since quitting: 48.2   Smokeless tobacco: Never  Vaping Use   Vaping status: Never Used  Substance and Sexual Activity   Alcohol  use: No   Drug use: No   Sexual activity: Yes  Other Topics Concern   Not on file  Social History Narrative   Patient lives at home with his wife.    Social Drivers of Corporate investment banker Strain: Not on file  Food Insecurity: Not on file  Transportation Needs: Not on file  Physical Activity: Not on file  Stress: Not on file  Social Connections: Not on file  Intimate Partner Violence: Not on file    FAMILY HISTORY: Family History  Problem Relation Age of Onset   Heart disease Mother    Heart attack Mother    Heart disease Father    Heart attack Father     ALLERGIES:  is allergic to macrodantin [nitrofurantoin], tape, sulfa antibiotics, bioflavonoid products, lisinopril, metformin  hcl, nutritional supplements, penicillins, pioglitazone, and sulfamethoxazole-trimethoprim.  MEDICATIONS:  Current Outpatient Medications  Medication Sig Dispense Refill   acetaminophen  (TYLENOL ) 325 MG tablet Take 650 mg by mouth every 6 (six) hours as needed for mild pain.     aspirin  81 MG chewable tablet Chew 1 tablet (81 mg total) by mouth daily. 360 tablet 0   carvedilol  (COREG ) 25 MG tablet Take 1 tablet (25 mg total) by mouth 2 (two) times daily. 180 tablet 3   Choline Fenofibrate  (FENOFIBRIC ACID) 135 MG CPDR Take 1 capsule by mouth daily.     clonazePAM  (KLONOPIN ) 0.5 MG tablet Take 0.5 mg by mouth daily.     doxazosin  (CARDURA ) 4 MG tablet TAKE ONE TABLET BY MOUTH DAILY 90 tablet 3   glucose blood test strip 1 each by Other route as needed.       insulin  glargine (LANTUS ) 100 UNIT/ML injection Inject 70 Units into the skin daily.     isosorbide  mononitrate (IMDUR ) 60 MG 24 hr tablet Take 1 tablet (60 mg total) by mouth daily. 90 tablet 2   Lancets (ONETOUCH DELICA PLUS LANCET33G) MISC 1 each by Other route daily.      losartan  (COZAAR ) 100 MG tablet TAKE 1 TABLET by mouth ONCE DAILY. 90 tablet 3   magnesium  oxide (MAG-OX) 400 MG tablet Take 400 mg by mouth daily.     nitroGLYCERIN  (NITROSTAT ) 0.4 MG SL tablet PLACE 1 TABLET UNDER THE TONGUE EVERY 5 MINUTES AS NEEDED FOR CHEST PAIN UP TO 3 DOSES 25 tablet 9   ONE TOUCH ULTRA TEST test strip 1 each by Other route daily.      ranolazine  (RANEXA ) 500 MG 12 hr tablet TAKE 1 TABLET  BY MOUTH TWICE DAILY. 180 tablet 3   rosuvastatin  (CRESTOR ) 10 MG tablet TAKE 1 TABLET by mouth ONCE DAILY. 90 tablet 2   sitaGLIPtin  (JANUVIA ) 100 MG tablet Take 100 mg by mouth daily. (Patient not taking: Reported on 08/18/2023)     No current facility-administered medications for this visit.    REVIEW OF SYSTEMS:   All relevant systems were reviewed with the patient and are negative.  PHYSICAL EXAMINATION: ECOG PERFORMANCE STATUS: {CHL ONC ECOG PS:[id number]}  There were no vitals filed for this visit. There were no vitals filed for this visit.  GENERAL: alert, no distress and comfortable SKIN: skin color is normal, no jaundice, rashes or significant lesions EYES: sclera clear OROPHARYNX: no exudate, no erythema NECK: supple LYMPH:  no palpable lymphadenopathy in the cervical, axillary regions LUNGS: Effort normal, no respiratory distress.  Clear to auscultation bilaterally HEART: regular rate & rhythm and no lower extremity edema ABDOMEN: soft, non-tender and nondistended Musculoskeletal: no point tenderness NEURO: no focal motor/sensory deficits  LABORATORY DATA:  I have reviewed the data as listed Lab Results  Component Value Date   WBC 6.2 08/19/2023   HGB 13.0 08/19/2023   HCT 40.7  08/19/2023   MCV 80 08/19/2023   PLT 205 08/19/2023   Recent Labs    10/20/22 1400 08/19/23 1058  NA 139 137  K 4.5 4.8  CL 101 102  CO2 24 20  GLUCOSE 195* 221*  BUN 22 26  CREATININE 1.51* 1.28*  CALCIUM  10.0 10.2  ALT 10  --     RADIOGRAPHIC STUDIES: I have personally reviewed the radiological images as listed and agreed with the findings in the report. No results found.

## 2023-08-31 ENCOUNTER — Inpatient Hospital Stay

## 2023-08-31 ENCOUNTER — Telehealth: Payer: Self-pay | Admitting: *Deleted

## 2023-08-31 VITALS — BP 158/86 | HR 66 | Temp 97.7°F | Resp 18 | Wt 191.2 lb

## 2023-08-31 DIAGNOSIS — I110 Hypertensive heart disease with heart failure: Secondary | ICD-10-CM | POA: Insufficient documentation

## 2023-08-31 DIAGNOSIS — K5909 Other constipation: Secondary | ICD-10-CM

## 2023-08-31 DIAGNOSIS — Z8673 Personal history of transient ischemic attack (TIA), and cerebral infarction without residual deficits: Secondary | ICD-10-CM | POA: Insufficient documentation

## 2023-08-31 DIAGNOSIS — D649 Anemia, unspecified: Secondary | ICD-10-CM

## 2023-08-31 DIAGNOSIS — C641 Malignant neoplasm of right kidney, except renal pelvis: Secondary | ICD-10-CM | POA: Diagnosis not present

## 2023-08-31 DIAGNOSIS — R519 Headache, unspecified: Secondary | ICD-10-CM | POA: Diagnosis not present

## 2023-08-31 DIAGNOSIS — C649 Malignant neoplasm of unspecified kidney, except renal pelvis: Secondary | ICD-10-CM

## 2023-08-31 DIAGNOSIS — C7800 Secondary malignant neoplasm of unspecified lung: Secondary | ICD-10-CM | POA: Insufficient documentation

## 2023-08-31 LAB — IRON AND IRON BINDING CAPACITY (CC-WL,HP ONLY)
Iron: 50 ug/dL (ref 45–182)
Saturation Ratios: 13 % — ABNORMAL LOW (ref 17.9–39.5)
TIBC: 388 ug/dL (ref 250–450)
UIBC: 338 ug/dL (ref 117–376)

## 2023-08-31 LAB — FERRITIN: Ferritin: 149 ng/mL (ref 24–336)

## 2023-08-31 LAB — T4, FREE: Free T4: 0.65 ng/dL (ref 0.61–1.12)

## 2023-08-31 LAB — VITAMIN B12: Vitamin B-12: 215 pg/mL (ref 180–914)

## 2023-08-31 LAB — FOLATE: Folate: 7.2 ng/mL (ref >5.9)

## 2023-08-31 NOTE — Telephone Encounter (Signed)
 LM for [facility] requesting recent CT be uploaded to our system.

## 2023-08-31 NOTE — Telephone Encounter (Signed)
 Called and spoke with patient who states that he has an oncology appt today and will have them check BP there and let us  know what it was. States he checked it this morning at home  and it was 134/55. He has several upcoming appt's for him and his wife, so doesn't want to come in for BP check if at all possible. Will await his response as to what his BP was at the OV today.

## 2023-09-02 ENCOUNTER — Other Ambulatory Visit: Payer: Self-pay

## 2023-09-02 ENCOUNTER — Inpatient Hospital Stay
Admission: RE | Admit: 2023-09-02 | Discharge: 2023-09-02 | Disposition: A | Discharge status: Home / Self Care | Payer: Self-pay | Source: Ambulatory Visit

## 2023-09-02 DIAGNOSIS — C7800 Secondary malignant neoplasm of unspecified lung: Secondary | ICD-10-CM

## 2023-09-02 NOTE — Telephone Encounter (Signed)
 Order placed to receive/upload 07/15/23 CT C/A/P from [facility].  LM with [facility] that order has been placed

## 2023-09-04 LAB — METHYLMALONIC ACID, SERUM: Methylmalonic Acid, Quantitative: 506 nmol/L — ABNORMAL HIGH (ref 0–378)

## 2023-09-06 ENCOUNTER — Ambulatory Visit: Payer: Self-pay

## 2023-09-06 NOTE — Telephone Encounter (Signed)
-----   Message from [name] sent at 09/06/2023  6:53 AM EDT ----- Zorita Please let Mr. Zarazua know of low b12. He can start 500 mcg daily. Thanks  ----- Message ----- From: Rebecka, Lab [facility] [place] Sent: 08/31/2023   1:48 PM EDT To: [name], MD

## 2023-09-06 NOTE — Telephone Encounter (Signed)
 Notified of message below

## 2023-09-14 ENCOUNTER — Ambulatory Visit: Admitting: Physician Assistant

## 2023-09-14 ENCOUNTER — Telehealth: Payer: Self-pay

## 2023-09-14 NOTE — Telephone Encounter (Signed)
 Spoke with patient confirming upcoming appointment changes

## 2023-09-15 ENCOUNTER — Ambulatory Visit (HOSPITAL_COMMUNITY)
Admission: RE | Admit: 2023-09-15 | Discharge: 2023-09-15 | Disposition: A | Discharge status: Home / Self Care | Source: Ambulatory Visit

## 2023-09-15 DIAGNOSIS — C649 Malignant neoplasm of unspecified kidney, except renal pelvis: Secondary | ICD-10-CM | POA: Diagnosis present

## 2023-09-15 DIAGNOSIS — C7800 Secondary malignant neoplasm of unspecified lung: Secondary | ICD-10-CM | POA: Insufficient documentation

## 2023-09-15 DIAGNOSIS — R519 Headache, unspecified: Secondary | ICD-10-CM | POA: Insufficient documentation

## 2023-09-15 MED ORDER — GADOBUTROL 1 MMOL/ML IV SOLN
8.0000 mL | Freq: Once | INTRAVENOUS | Status: AC | PRN
  Administered 2023-09-15: 8 mL via INTRAVENOUS

## 2023-09-16 ENCOUNTER — Inpatient Hospital Stay

## 2023-09-16 ENCOUNTER — Telehealth: Payer: Self-pay

## 2023-09-16 VITALS — BP 185/57 | HR 79 | Temp 97.7°F | Resp 18 | Wt 189.2 lb

## 2023-09-16 DIAGNOSIS — C642 Malignant neoplasm of left kidney, except renal pelvis: Secondary | ICD-10-CM

## 2023-09-16 DIAGNOSIS — C7801 Secondary malignant neoplasm of right lung: Secondary | ICD-10-CM | POA: Diagnosis not present

## 2023-09-16 DIAGNOSIS — C7802 Secondary malignant neoplasm of left lung: Secondary | ICD-10-CM

## 2023-09-16 DIAGNOSIS — C7951 Secondary malignant neoplasm of bone: Secondary | ICD-10-CM

## 2023-09-16 DIAGNOSIS — Z7189 Other specified counseling: Secondary | ICD-10-CM | POA: Diagnosis not present

## 2023-09-16 DIAGNOSIS — C641 Malignant neoplasm of right kidney, except renal pelvis: Secondary | ICD-10-CM | POA: Diagnosis not present

## 2023-09-16 NOTE — Telephone Encounter (Signed)
 Spoke with patient confirming upcoming appointment change

## 2023-09-16 NOTE — Progress Notes (Unsigned)
 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  Diagnosis: stage IV RCC with lung metastases  From CT report 04/2020  RLL 1.8 cm  nodule. 05/2021. Progressive bilateral plum metastases, RLL 2.5cm. right renal mass 10 cm. 07/2022 reported 3.7 cm. From 08/2023. Progressive bilateral nodules, RLL 5.7cm. Right RCC 12 cm. Cr. 1.1.  IMDC risk: Prognostic Risk Groups: Intermediate-risk group. Performance status <80% (Karnofsky)   We discussed the diagnosis and management of stage IV RCC today.  He has fairly indolent course of RCC.  He has no symptoms to this date despite enlarging metastatic disease and primary mass.  He has multiple comorbidities.  Recently he is transitioning to a new cardiologist he has not met.  Report he is not a candidate for any surgery.  I am not sure if he is a candidate for biopsy.  Will reach out to cardiology about this.  At this time, I do not see an immediate need for systemic treatment.  We also talked about potential side effects of treatment.  TKI will worsening his uncontrolled hypertension.  So he need to have hypertension managed first.  Immunotherapy can be considered.  We discussed potential side effects.  As he has no symptoms, side effect likely will be significant if develop.  He voiced quality of life is more important.  We discussed that given his comorbidities, may consider palliative care  consultation, advance care planning.  Alternatively, transition to hospice if developing physical decline and worsening functional status.  He is comfortable with this plan.  In the meantime, staging with MRI of the brain.  If there is small metastases, SRS can be pursued.  Assessment & Plan   No orders of the defined types were placed in this encounter.   All questions were answered. The patient knows to call the clinic with any problems, questions or concerns. No barriers to learning was detected.  Alexis BROCKS [place], MD 7/17/20254:12 PM  CHIEF COMPLAINTS/PURPOSE OF CONSULTATION:  Stage IV kidney cancer  HISTORY OF PRESENTING ILLNESS:  Alexis Griffith 83 y.o. male is here because of kidney cancer. He has history of TAVR and heart failure. He has been following with cardiology. He also is working to manage his uncontrolled hypertension in the last year.  Clinically, he does have cardiovascular comorbidities.  Recently, he has uncontrolled hypertension.  He coughs more in the morning from drainage. He denies worsening in the last year. Probably related to allergy. He denies more short of breath than past. Report his oxygen in the morning are 93-98% in the past few years. He denies short of breath easily though he is not getting enough exercises. He reports not feeling like due to low energy. This is since heart surgery for valve replacement and not worsening in the last year.  He denies more abdominal pain. No nausea, vomiting or diarrhea.  No bloody urine. No trouble urinating.  Report some headaches he attributes to the sinus in the temporal area.  No focal weakness. History of TIA many years ago.   He has been following with Dr. Patrcia at urology for annual CT scan surveillance for intermittent RCC.  From CT report 04/2020  RLL 1.8 cm  nodule. 05/2021. Progressive bilateral plum metastases, RLL 2.5cm. right renal mass 10 cm. 07/2022 reported 3.7 cm. From 08/2023. Progressive bilateral nodules,  RLL 5.7cm. Right RCC 12 cm. Cr. 1.1.  He has no worsening cough, shortness of breath, chest pain, discomfort, abdominal pain, or back pain or hematuria.  MEDICAL HISTORY:  Past Medical History:  Diagnosis Date   Anxiety    Bladder neck obstruction 06/05/2013   CAD (coronary artery disease)    Carotid artery disease (HCC)    CTA 08/2019 of the head and neck show moderate 65 to 75% bilateral ICA stenoses    Chronic diastolic CHF (congestive heart failure) (HCC)    Diabetes mellitus type 2 in nonobese (HCC)    Excessive urination at night 06/05/2013   Giant cell arteritis (HCC) 05/17/2012   Hyperlipidemia    Hypertension    Hypertensive urgency    Hypomagnesemia 09/11/2019   Mild anemia 09/11/2019   NSTEMI (non-ST elevated myocardial infarction) (HCC) 09/09/2019   Orthostatic hypotension 09/11/2019   Premature atrial contractions 09/12/2019   Pulmonary nodules    PVC (premature ventricular contraction)    Renal mass    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   s/p TAVR with 26 mm Edwards S3U via TF approach by Dr. Wonda & Dr. Lucas   Severe aortic stenosis    Sinus bradycardia on ECG    Sleep apnea    Squamous cell carcinoma of tongue [facility]) September 2012   Followed by Dr. Mable.   Stroke [facility])    TIA (transient ischemic attack)    Urinary retention 09/11/2019    SURGICAL HISTORY: Past Surgical History:  Procedure Laterality Date   CATARACT EXTRACTION, BILATERAL  10/2015, and 11/2015   with adding  TORIC lenses bilaterally    CORONARY PRESSURE/FFR STUDY N/A 07/07/2019   Procedure: INTRAVASCULAR PRESSURE WIRE/FFR STUDY;  Surgeon: Anner Alm ORN, MD;  Location: [facility] INVASIVE CV LAB;  Service: Cardiovascular;  Laterality: N/A;   LEFT HEART CATH AND CORONARY ANGIOGRAPHY N/A 07/07/2019   Procedure: LEFT HEART CATH AND CORONARY ANGIOGRAPHY;  Surgeon: Anner Alm ORN, MD;  Location: [facility] INVASIVE CV LAB;  Service: Cardiovascular;  Laterality: N/A;   SQUAMOUS CELL  CARCINOMA EXCISION     tongue   TEE WITHOUT CARDIOVERSION N/A 08/15/2019   Procedure: TRANSESOPHAGEAL ECHOCARDIOGRAM (TEE);  Surgeon: Wonda Sharper, MD;  Location: [facility] OR;  Service: Open Heart Surgery;  Laterality: N/A;   TRANSCATHETER AORTIC VALVE REPLACEMENT, TRANSFEMORAL N/A 08/15/2019   Procedure: TRANSCATHETER AORTIC VALVE REPLACEMENT, TRANSFEMORAL using Edwards [employer] SAPIEN 3 Ultra 26 mm THV.;  Surgeon: Wonda Sharper, MD;  Location: [facility] OR;  Service: Open Heart Surgery;  Laterality: N/A;    SOCIAL HISTORY: Social History   Socioeconomic History   Marital status: Married    Spouse name: Not on file   Number of children: 2   Years of education: Not on file   Highest education level: Not on file  Occupational History   Not on file  Tobacco Use   Smoking status: Former    Current packs/day: 0.00    Types: Cigarettes    Quit date: 06/25/1975    Years since quitting: 48.2   Smokeless tobacco: Never  Vaping Use   Vaping status: Never Used  Substance and Sexual Activity  Alcohol  use: No   Drug use: No   Sexual activity: Yes  Other Topics Concern   Not on file  Social History Narrative   Patient lives at home with his wife.    Social Drivers of Corporate investment banker Strain: Not on file  Food Insecurity: No Food Insecurity (08/31/2023)   Hunger Vital Sign    Worried About Running Out of Food in the Last Year: Never true    Ran Out of Food in the Last Year: Never true  Transportation Needs: No Transportation Needs (08/31/2023)   PRAPARE - Administrator, Civil Service (Medical): No    Lack of Transportation (Non-Medical): No  Physical Activity: Not on file  Stress: Not on file  Social Connections: Not on file  Intimate Partner Violence: Not At Risk (08/31/2023)   Humiliation, Afraid, Rape, and Kick questionnaire    Fear of Current or Ex-Partner: No    Emotionally Abused: No    Physically Abused: No    Sexually Abused: No    FAMILY  HISTORY: Family History  Problem Relation Age of Onset   Heart disease Mother    Heart attack Mother    Heart disease Father    Heart attack Father     ALLERGIES:  is allergic to macrodantin [nitrofurantoin], tape, sulfa antibiotics, bioflavonoid products, lisinopril, metformin  hcl, nutritional supplements, nutritional supplements, penicillins, pioglitazone, and sulfamethoxazole-trimethoprim.  MEDICATIONS:  Current Outpatient Medications  Medication Sig Dispense Refill   acetaminophen  (TYLENOL ) 325 MG tablet Take 650 mg by mouth every 6 (six) hours as needed for mild pain.     aspirin  81 MG chewable tablet Chew 1 tablet (81 mg total) by mouth daily. 360 tablet 0   carvedilol  (COREG ) 25 MG tablet Take 1 tablet (25 mg total) by mouth 2 (two) times daily. 180 tablet 3   Choline Fenofibrate  (FENOFIBRIC ACID) 135 MG CPDR Take 1 capsule by mouth daily.     clonazePAM  (KLONOPIN ) 0.5 MG tablet Take 0.5 mg by mouth daily.     doxazosin  (CARDURA ) 4 MG tablet TAKE ONE TABLET BY MOUTH DAILY 90 tablet 3   glucose blood test strip 1 each by Other route as needed.      insulin  glargine (LANTUS ) 100 UNIT/ML injection Inject 70 Units into the skin daily.     isosorbide  mononitrate (IMDUR ) 60 MG 24 hr tablet Take 1 tablet (60 mg total) by mouth daily. 90 tablet 2   Lancets (ONETOUCH DELICA PLUS LANCET33G) MISC 1 each by Other route daily.      losartan  (COZAAR ) 100 MG tablet TAKE 1 TABLET by mouth ONCE DAILY. 90 tablet 3   magnesium  oxide (MAG-OX) 400 MG tablet Take 400 mg by mouth daily.     nitroGLYCERIN  (NITROSTAT ) 0.4 MG SL tablet PLACE 1 TABLET UNDER THE TONGUE EVERY 5 MINUTES AS NEEDED FOR CHEST PAIN UP TO 3 DOSES 25 tablet 9   ONE TOUCH ULTRA TEST test strip 1 each by Other route daily.      ranolazine  (RANEXA ) 500 MG 12 hr tablet TAKE 1 TABLET BY MOUTH TWICE DAILY. 180 tablet 3   rosuvastatin  (CRESTOR ) 10 MG tablet TAKE 1 TABLET by mouth ONCE DAILY. 90 tablet 2   sitaGLIPtin  (JANUVIA ) 100 MG  tablet Take 100 mg by mouth daily. (Patient not taking: Reported on 08/31/2023)     No current facility-administered medications for this visit.    REVIEW OF SYSTEMS:   All relevant systems were reviewed with the patient and  are negative.  PHYSICAL EXAMINATION: ECOG PERFORMANCE STATUS: 2 - Symptomatic, <50% confined to bed  Vitals:   09/16/23 1500  BP: (!) 185/57  Pulse: 79  Resp: 18  Temp: 97.7 F (36.5 C)  SpO2: 93%   Filed Weights   09/16/23 1500  Weight: 189 lb 3.2 oz (85.8 kg)    GENERAL: alert, no distress and comfortable SKIN: skin color is normal, no jaundice EYES: sclera clear OROPHARYNX: no exudate, no erythema NECK: supple LYMPH:  no palpable lymphadenopathy in the cervical, axillary regions LUNGS: Effort normal, no respiratory distress.  Clear to auscultation bilaterally HEART: regular rate & rhythm and no lower extremity edema ABDOMEN: soft, non-tender and nondistended Musculoskeletal: no point tenderness NEURO: no focal motor/sensory deficits  LABORATORY DATA:  I have reviewed the data as listed Lab Results  Component Value Date   WBC 6.2 08/19/2023   HGB 13.0 08/19/2023   HCT 40.7 08/19/2023   MCV 80 08/19/2023   PLT 205 08/19/2023   Recent Labs    10/20/22 1400 08/19/23 1058  NA 139 137  K 4.5 4.8  CL 101 102  CO2 24 20  GLUCOSE 195* 221*  BUN 22 26  CREATININE 1.51* 1.28*  CALCIUM  10.0 10.2  ALT 10  --     RADIOGRAPHIC STUDIES: I have personally reviewed the radiological images as listed and agreed with the findings in the report.

## 2023-09-17 DIAGNOSIS — Z7189 Other specified counseling: Secondary | ICD-10-CM | POA: Insufficient documentation

## 2023-09-20 DIAGNOSIS — C7800 Secondary malignant neoplasm of unspecified lung: Secondary | ICD-10-CM | POA: Insufficient documentation

## 2023-09-20 DIAGNOSIS — C7951 Secondary malignant neoplasm of bone: Secondary | ICD-10-CM | POA: Insufficient documentation

## 2023-09-20 NOTE — Progress Notes (Signed)
 Radiation Oncology         [phone]) [phone] ________________________________  Name: Alexis Griffith        MRN: [id number]  Date of Service: 09/23/2023 DOB: 1940/05/26  RR:[id number], Charlie LELON, MD  Tina Pauletta BROCKS, MD     REFERRING PHYSICIAN: Tina Pauletta BROCKS, MD   DIAGNOSIS: The primary encounter diagnosis was Metastasis to bone [facility]). A diagnosis of Malignant neoplasm metastatic to both lungs [facility]) was also pertinent to this visit.   HISTORY OF PRESENT ILLNESS: Alexis Griffith is a 83 y.o. male seen at the request of Dr. Tina for a putative diagnosis of metastatic renal cell carcinoma involving the left kidney and bilateral lung, and scalp metastasis. The patient has a history of aortic stenosis and was evaluated radiographically in May 2021 in preparation for a TAVR procedure.  This identified a 8.3 cm hypervascular mass in the interval polar region of the left kidney as well as a 1.3 cm right lower lobe nodule and 9 mm left upper lobe nodule. He underwent his TAVR on 08/15/2019, he has been followed closely by urology in over the years the changes in the nodules in his lungs and mass in his left kidney have increased.  It does not appear that he has had any tissue sampling but he has overall had somewhat of an indolent course in the last few years with these findings.  He was referred to medical oncology. A CT CAP on 07/15/23 showed progression of the left kidney mass measuring 12.4 cm (10.7 in May 2024), and progressive RLL nodule measuring 5.7 cm (previously 3.7 cm) in additional to multiple nodules tin both lungs. An MRI brain due to headaches and his history was performed on 09/15/23 and showed a 14 mm lesion in the right parietal calvarium but no parenchymal disease was noted.  He is seen to consider a palliative course of radiation.  PREVIOUS RADIATION THERAPY: No   PAST MEDICAL HISTORY:  Past Medical History:  Diagnosis Date   Anxiety    Bladder neck obstruction 06/05/2013   CAD (coronary  artery disease)    Carotid artery disease (HCC)    CTA 08/2019 of the head and neck show moderate 65 to 75% bilateral ICA stenoses    Chronic diastolic CHF (congestive heart failure) (HCC)    Diabetes mellitus type 2 in nonobese (HCC)    Excessive urination at night 06/05/2013   Giant cell arteritis (HCC) 05/17/2012   Hyperlipidemia    Hypertension    Hypertensive urgency    Hypomagnesemia 09/11/2019   Mild anemia 09/11/2019   NSTEMI (non-ST elevated myocardial infarction) (HCC) 09/09/2019   Orthostatic hypotension 09/11/2019   Premature atrial contractions 09/12/2019   Pulmonary nodules    PVC (premature ventricular contraction)    Renal mass    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   s/p TAVR with 26 mm Edwards S3U via TF approach by Dr. Wonda & Dr. Lucas   Severe aortic stenosis    Sinus bradycardia on ECG    Sleep apnea    Squamous cell carcinoma of tongue [facility]) September 2012   Followed by Dr. Mable.   Stroke [facility])    TIA (transient ischemic attack)    Urinary retention 09/11/2019       PAST SURGICAL HISTORY: Past Surgical History:  Procedure Laterality Date   CATARACT EXTRACTION, BILATERAL  10/2015, and 11/2015   with adding  TORIC lenses bilaterally    CORONARY PRESSURE/FFR STUDY N/A 07/07/2019   Procedure:  INTRAVASCULAR PRESSURE WIRE/FFR STUDY;  Surgeon: Anner Alm ORN, MD;  Location: [facility] INVASIVE CV LAB;  Service: Cardiovascular;  Laterality: N/A;   LEFT HEART CATH AND CORONARY ANGIOGRAPHY N/A 07/07/2019   Procedure: LEFT HEART CATH AND CORONARY ANGIOGRAPHY;  Surgeon: Anner Alm ORN, MD;  Location: [facility] INVASIVE CV LAB;  Service: Cardiovascular;  Laterality: N/A;   SQUAMOUS CELL CARCINOMA EXCISION     tongue   TEE WITHOUT CARDIOVERSION N/A 08/15/2019   Procedure: TRANSESOPHAGEAL ECHOCARDIOGRAM (TEE);  Surgeon: Wonda Sharper, MD;  Location: [facility] OR;  Service: Open Heart Surgery;  Laterality: N/A;   TRANSCATHETER AORTIC VALVE REPLACEMENT, TRANSFEMORAL  N/A 08/15/2019   Procedure: TRANSCATHETER AORTIC VALVE REPLACEMENT, TRANSFEMORAL using Edwards [employer] SAPIEN 3 Ultra 26 mm THV.;  Surgeon: Wonda Sharper, MD;  Location: [facility] OR;  Service: Open Heart Surgery;  Laterality: N/A;     FAMILY HISTORY:  Family History  Problem Relation Age of Onset   Heart disease Mother    Heart attack Mother    Heart disease Father    Heart attack Father      SOCIAL HISTORY:  reports that he quit smoking about 48 years ago. His smoking use included cigarettes. He has never used smokeless tobacco. He reports that he does not drink alcohol  and does not use drugs.   ALLERGIES: Macrodantin [nitrofurantoin], Tape, Sulfa antibiotics, Bioflavonoid products, Lisinopril, Metformin  hcl, Nutritional supplements, Nutritional supplements, Penicillins, Pioglitazone, and Sulfamethoxazole-trimethoprim   MEDICATIONS:  Current Outpatient Medications  Medication Sig Dispense Refill   acetaminophen  (TYLENOL ) 325 MG tablet Take 650 mg by mouth every 6 (six) hours as needed for mild pain.     aspirin  81 MG chewable tablet Chew 1 tablet (81 mg total) by mouth daily. 360 tablet 0   carvedilol  (COREG ) 25 MG tablet Take 1 tablet (25 mg total) by mouth 2 (two) times daily. 180 tablet 3   Choline Fenofibrate  (FENOFIBRIC ACID) 135 MG CPDR Take 1 capsule by mouth daily.     clonazePAM  (KLONOPIN ) 0.5 MG tablet Take 0.5 mg by mouth daily.     doxazosin  (CARDURA ) 4 MG tablet TAKE ONE TABLET BY MOUTH DAILY 90 tablet 3   FARXIGA 10 MG TABS tablet Take 10 mg by mouth daily.     glucose blood test strip 1 each by Other route as needed.      insulin  glargine (LANTUS ) 100 UNIT/ML injection Inject 70 Units into the skin daily.     irbesartan  (AVAPRO ) 150 MG tablet Take 1 tablet (150 mg total) by mouth daily. 90 tablet 1   isosorbide  mononitrate (IMDUR ) 60 MG 24 hr tablet Take 1 tablet (60 mg total) by mouth daily. 90 tablet 2   Lancets (ONETOUCH DELICA PLUS LANCET33G) MISC 1 each by Other  route daily.      magnesium  oxide (MAG-OX) 400 MG tablet Take 400 mg by mouth daily.     nitroGLYCERIN  (NITROSTAT ) 0.4 MG SL tablet PLACE 1 TABLET UNDER THE TONGUE EVERY 5 MINUTES AS NEEDED FOR CHEST PAIN UP TO 3 DOSES 25 tablet 9   ONE TOUCH ULTRA TEST test strip 1 each by Other route daily.      ranolazine  (RANEXA ) 500 MG 12 hr tablet Take 1 tablet (500 mg total) by mouth 2 (two) times daily. 180 tablet 3   rosuvastatin  (CRESTOR ) 10 MG tablet TAKE 1 TABLET by mouth ONCE DAILY. (Patient not taking: Reported on 09/23/2023) 90 tablet 2   No current facility-administered medications for this encounter.     REVIEW OF SYSTEMS:  On review of systems, the patient reports that he is doing well overall. He denies any headaches, visual disruptions, pain in his scalp or palpable abnormalities. No other complaints are verbalized.     PHYSICAL EXAM:  Wt Readings from Last 3 Encounters:  09/23/23 191 lb 2 oz (86.7 kg)  09/22/23 191 lb 12.8 oz (87 kg)  09/16/23 189 lb 3.2 oz (85.8 kg)   Temp Readings from Last 3 Encounters:  09/23/23 (!) 97.1 F (36.2 C) (Temporal)  09/16/23 97.7 F (36.5 C)  08/31/23 97.7 F (36.5 C)   BP Readings from Last 3 Encounters:  09/23/23 (!) 172/57  09/22/23 (!) 173/62  09/16/23 (!) 185/57   Pulse Readings from Last 3 Encounters:  09/23/23 72  09/22/23 77  09/16/23 79   Pain Assessment Pain Score: 3  Pain Loc:  (arthritis)/10  In general this is a well appearing caucasian male in no acute distress. He's alert and oriented x4 and appropriate throughout the examination. Cardiopulmonary assessment is negative for acute distress and hee exhibits normal effort.     ECOG = 0  0 - Asymptomatic (Fully active, able to carry on all predisease activities without restriction)  1 - Symptomatic but completely ambulatory (Restricted in physically strenuous activity but ambulatory and able to carry out work of a light or sedentary nature. For example, light housework,  office work)  2 - Symptomatic, <50% in bed during the day (Ambulatory and capable of all self care but unable to carry out any work activities. Up and about more than 50% of waking hours)  3 - Symptomatic, >50% in bed, but not bedbound (Capable of only limited self-care, confined to bed or chair 50% or more of waking hours)  4 - Bedbound (Completely disabled. Cannot carry on any self-care. Totally confined to bed or chair)  5 - Death   Raylene MM, Creech RH, Tormey DC, et al. [phone]). Toxicity and response criteria of the [facility]. Am. DOROTHA Bridges. Oncol. 5 (6): 649-55    LABORATORY DATA:  Lab Results  Component Value Date   WBC 6.2 08/19/2023   HGB 13.0 08/19/2023   HCT 40.7 08/19/2023   MCV 80 08/19/2023   PLT 205 08/19/2023   Lab Results  Component Value Date   NA 137 08/19/2023   K 4.8 08/19/2023   CL 102 08/19/2023   CO2 20 08/19/2023   Lab Results  Component Value Date   ALT 10 10/20/2022   AST 14 12/23/2021   ALKPHOS 51 12/23/2021   BILITOT 0.3 12/23/2021      RADIOGRAPHY: MR Brain W Wo Contrast Result Date: 09/15/2023 CLINICAL DATA:  Provided history: Malignant neoplasm of kidney metastatic to lung. Persistent headaches. Progressive kidney cancer. Rule out metastases. EXAM: MRI HEAD WITHOUT AND WITH CONTRAST TECHNIQUE: Multiplanar, multiecho pulse sequences of the brain and surrounding structures were obtained without and with intravenous contrast. CONTRAST:  8mL GADAVIST  GADOBUTROL  1 MMOL/ML IV SOLN COMPARISON:  CT angiogram head 08/16/2019.  Brain MRI 08/16/2019. FINDINGS: Brain: Advanced generalized cerebral atrophy. Multifocal T2 FLAIR hyperintense signal abnormality within the cerebral white matter, nonspecific but compatible with mild chronic small vessel ischemic disease. Tiny chronic infarcts within the bilateral cerebellar hemispheres, increased in number. There is no acute infarct. No evidence of an intracranial mass. No chronic  intracranial blood products. No extra-axial fluid collection. No midline shift. No pathologic intracranial enhancement identified. 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 Flow voids preserved elsewhere within the proximal  large arterial vessels. Skull and upper cervical spine: 14 mm T2 hyperintense and enhancing lesion within the right parietal calvarium (series 6, image 27) (series 10, image 40). This is new from the prior brain MRI of 08/16/2019 and highly suspicious for an osseous metastasis. Sinuses/Orbits: No mass or acute finding within the imaged orbits. Prior bilateral ocular lens replacement. Mild mucosal thickening within the right maxillary sinus. Mild mucosal thickening, and small mucous retention cyst, within the left maxillary sinus. Impression #2 will be called to the ordering clinician or representative by the Radiologist Assistant, and communication documented in the PACS or [employer]. IMPRESSION: 1. No evidence of intracranial metastatic disease. 2. 14 mm enhancing lesion within the right parietal calvarium, new from the prior MRI of 08/16/2019 and highly suspicious for an osseous metastasis. 3. Mild chronic small vessel ischemic changes within the cerebral white matter, similar to the prior MRI. 4. Tiny chronic infarcts within the bilateral cerebellar hemispheres, increased in number. 5. 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 6. Advanced generalized cerebral atrophy, progressed. 7. Paranasal sinus disease as described. Electronically Signed   By: Rockey Childs D.O.   On: 09/15/2023 18:12       IMPRESSION/PLAN: 1. 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 Dr. Dewey discusses the pathology findings and reviews the nature of metastatic cancer involving the skull. Dr. Dewey discusses the rationale to consider palliative radiation to the skull to reduce growth and prevent  pressure on brain tissue which could cause neurologic symptoms, and or to prevent risks of the tumor erroding the scalp can causing pain and necrotic drainage.  We discussed the risks, benefits, short, and long term effects of radiotherapy, as well as the palliative intent. Dr. Dewey discusses the delivery and logistics of radiotherapy and anticipates a course of 2 weeks of radiotherapy. The patient is interested rather in closer surveillance and we will coordinate a repeat MRI brain in 3 months. He is in agreement with this plan.   In a visit lasting 60 minutes, greater than 50% of the time was spent face to face discussing the patient's condition, in preparation for the discussion, and coordinating the patient's care.   The above documentation reflects my direct findings during this shared patient visit. Please see the separate note by Dr. Dewey on this date for the remainder of the patient's plan of care.    Donald KYM Husband, [facility]   **Disclaimer: This note was dictated with voice recognition software. Similar sounding words can inadvertently be transcribed and this note may contain transcription errors which may not have been corrected upon publication of note.**

## 2023-09-21 ENCOUNTER — Telehealth: Payer: Self-pay

## 2023-09-21 NOTE — Telephone Encounter (Signed)
 Left the patient a voicemail with the scheduled appointment details per LOS notes.

## 2023-09-21 NOTE — Progress Notes (Signed)
 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  MRI Brain 09/15/2023:  No evidence of intracranial metastatic disease. 2. 14 mm enhancing lesion within the right parietal calvarium, new from the prior MRI of 08/16/2019 and highly suspicious for an osseous metastasis.  Sites of Visceral and Bony Metastatic Disease: Scalp Metastasis   Past/Anticipated chemotherapy by medical oncology, if any:  Dr. Tina 09/16/2023 -We discussed the MRI findings.   -There is a 14 mm enhancing lesion in the parietal calvarium.  Currently he is asymptomatic.   -I think this can be radiated nonurgently so to prevent edema in the event that if it grows intracranially.  -Referral placed to radiation oncology for scalp metastasis.    Pain on a scale of 0-10 is:     If Spine Met(s), symptoms, if any, include: Bowel/Bladder retention or incontinence (please describe):  Numbness or weakness in extremities (please describe):  Current Decadron regimen, if applicable:   Ambulatory status? Walker? Wheelchair?:   SAFETY ISSUES: Prior radiation?  Pacemaker/ICD?  Possible current pregnancy?  Is the patient on methotrexate?   Current Complaints / other details:

## 2023-09-22 ENCOUNTER — Ambulatory Visit: Attending: Physician Assistant | Admitting: Physician Assistant

## 2023-09-22 ENCOUNTER — Encounter: Payer: Self-pay | Admitting: Physician Assistant

## 2023-09-22 VITALS — BP 173/62 | HR 77 | Ht 70.0 in | Wt 191.8 lb

## 2023-09-22 DIAGNOSIS — C642 Malignant neoplasm of left kidney, except renal pelvis: Secondary | ICD-10-CM

## 2023-09-22 DIAGNOSIS — I10 Essential (primary) hypertension: Secondary | ICD-10-CM | POA: Diagnosis not present

## 2023-09-22 DIAGNOSIS — E785 Hyperlipidemia, unspecified: Secondary | ICD-10-CM

## 2023-09-22 DIAGNOSIS — I2510 Atherosclerotic heart disease of native coronary artery without angina pectoris: Secondary | ICD-10-CM | POA: Diagnosis not present

## 2023-09-22 DIAGNOSIS — Z8673 Personal history of transient ischemic attack (TIA), and cerebral infarction without residual deficits: Secondary | ICD-10-CM

## 2023-09-22 DIAGNOSIS — Z952 Presence of prosthetic heart valve: Secondary | ICD-10-CM | POA: Diagnosis not present

## 2023-09-22 MED ORDER — IRBESARTAN 150 MG PO TABS: 150.0000 mg | ORAL_TABLET | Freq: Every day | ORAL | 1 refills | Status: DC

## 2023-09-22 NOTE — Patient Instructions (Signed)
 Medication Instructions:  DISCONTINUE LOSARTAN    START IRBESARTAN  150 MG DAILY; IF AFTER 2 WEEKS SYSTOLIC BLOOD PRESSURE IS PERSISTENTLY GREATER THAN 150 INCREASE TO 300 MG DAILY AND GIVE OFFICE A CALL SO NEW PRESCRIPTION CAN BE SENT TO PHARMACY *If you need a refill on your cardiac medications before your next appointment, please call your pharmacy*  Lab Work: NO LABS If you have labs (blood work) drawn today and your tests are completely normal, you will receive your results only by: MyChart Message (if you have MyChart) OR A paper copy in the mail If you have any lab test that is abnormal or we need to change your treatment, we will call you to review the results.  Testing/Procedures: NO TESTING  Follow-Up: At [facility], you and your health needs are our priority.  As part of our continuing mission to provide you with exceptional heart care, our providers are all part of one team.  This team includes your primary Cardiologist (physician) and Advanced Practice Providers or APPs (Physician Assistants and Nurse Practitioners) who all work together to provide you with the care you need, when you need it.  Your next appointment:   2 month(s)  Provider:   Hao Meng, PA-C

## 2023-09-22 NOTE — Progress Notes (Unsigned)
 Cardiology Office Note   Date:  09/24/2023  ID:  Alexis, Griffith 1940/12/31, MRN [id number] PCP: Vernadine Charlie LELON, MD  [facility] Providers Cardiologist:  Peter Swaziland, MD Electrophysiologist:  Elspeth Sage, MD  Structural Heart:  Ozell Fell, MD    History of Present Illness Alexis Griffith is a 82 y.o. 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  Coronary CTA in 2021 revealed three-vessel CAD.  Subsequent cardiac catheterization performed on 07/07/2019 showed 30% ostial left main, 70% proximal to mid LAD, 70% D2, 40% mid to distal LAD, 75% proximal to mid left circumflex lesion, 100% occluded ostial to distal RCA lesion with significant left-to-right collaterals filling the PDA and PL system, 50% RPDA lesion, severe aortic stenosis.  CT surgery was consulted to consider options between two-vessel PCI and TAVR versus CABG/TAVR.  Ultimately it was decided to proceed with TAVR and treat coronary artery disease medically since patient denied any chest discomfort.  He eventually underwent TAVR procedure in June 2021, postprocedure, he had difficulty mobilizing and felt unsteady.  MRI of the brain showed a 4 mm acute infarct of the left medial frontal cortex which ultimately felt to be an incidental finding.  He also had subsequent syncope, all of his antihypertensives were held.  Systolic blood pressure was dropping from 130 down to 95 upon standing.  Plavix  was later discontinued in the setting of gross hematuria.  Last echocardiogram obtained on 08/02/2020 showed EF of 60 to 65%, normal RV, trivial MR, stable TAVR valve in the aortic position, no significant paravalvular leakage.  He had a ER visit in January 2022 was near syncope thought to be related to anxiety and orthostatic hypotension.  Heart monitor in 2024 was normal with single  episode of NSVT 17 beats.  Duplex ultrasound in July 2024 showed moderate disease in the right ICA, mild disease on the left side.  Patient was last seen by Dr. Alveta in August 2024 at which time he was doing well.  Patient was seen by Darryle Heritage on 08/18/2023 after complaining of 3 to 4 weeks of intermittent chest discomfort requiring nitroglycerin .  Blood pressure was in the 190s.  He was not felt to be a great cardiac catheterization candidate.  His blood pressure was very high.  Lopressor  was switched to carvedilol  25 mg twice a day.  Echocardiogram was also ordered.  He was recently set up with Dr. Tina of oncology service.  CT imaging in May showed a large left kidney mass measuring 12.4 x 11 cm.  This mass was previously measuring 10.7 x 8.7 cm in May 2024.  There are other lung nodules that also gradually increase in size over the years.  MRI of the brain showed a 14 mm enhancing lesion in the parietal calvarium.  He has been set up to see radiation oncology service.  Patient presents today for follow-up.  Blood pressures slightly improved, however remains elevated.  Even on manual recheck by myself blood pressure is 170/58.  He still occasionally has some intermittent chest discomfort, this does not occur with physical activity and occurs more so with certain food.  I recommended switching losartan  to 100 mg daily to irbesartan  150 mg daily.  If systolic blood pressure remain elevated greater than 150 mmHg after 2 weeks, patient has been instructed to increase irbesartan  to 300 mg daily at  that time.  Systolic blood pressure goal for this patient would be in the 130s to low 140s given his prior history of orthostatic hypotension where his blood pressure will drop by about 30 mmHg.  Otherwise, he can follow-up in 2 months for reassessment.  I do agree this patient is not a candidate for any cardiac catheterization at this time.  Given the lack of clear anginal symptom, we will continue medical  therapy.  ROS:   He denies chest pain, palpitations, dyspnea, pnd, orthopnea, n, v, dizziness, syncope, edema, weight gain, or early satiety. All other systems reviewed and are otherwise negative except as noted above.    Studies Reviewed      Cardiac Studies & Procedures   ______________________________________________________________________________________________ CARDIAC CATHETERIZATION  CARDIAC CATHETERIZATION 07/07/2019  Conclusion  Ost LM lesion is 30% stenosed.  Prox LAD to Mid LAD lesion is 70% stenosed with 70% stenosed side branch in 2nd Diag. Both vessels positive by CT FFR  Mid LAD to Dist LAD lesion is 40% stenosed. Dist LAD lesion is 75% stenosed.  Prox Cx lesion is 45% stenosed. Prox Cx to Mid Cx lesion is 75% stenosed. RFR positive is 0.87  Mid Cx lesion is 5% stenosed with 50% stenosed side branch in 3rd Mrg. No significant drop in RFR reading beyond this lesion.  Ost RCA to Dist RCA lesion is 100% stenosed. Significant left to right collaterals fill the PDA and PL system.  RPDA lesion is 50% stenosed.  --------------------------------------  The left ventricular systolic function is normal. The left ventricular ejection fraction is 55-65% by visual estimate. Regional wall motion knowledged and inferior wall.  LV end diastolic pressure is moderately elevated.  There is ~SEVERE AORTIC VALVE STENOSIS.   SUMMARY  Severe three-vessel disease: 100% CTO of ostial RCA (left to right collaterals fill PDA and PL), long 70% calcified proximal to mid LAD (positive by CT FFR), segmental ~75% proximal-mid LCx (positive by RFR)  Borderline SEVERE AORTIC STENOSIS-mean gradient estimated 37 mmHg by pullback.  Normal LVEF with basal inferior hypokinesis seen on echocardiogram and LV gram.   Plan will be to consult CVTS/Cardiology Valve Clinic to consider options between two-vessel PCI and TAVR versus CABG/AVR. He is not having resting chest pain, will discharge home  today on Imdur  pending Valve Clinic evaluation.SABRA [name], MD  Findings Coronary Findings Diagnostic  Dominance: Right  Left Main Vessel was injected. Vessel is large. Very short LEFT MAIN Ost LM lesion is 30% stenosed. The lesion is eccentric. The lesion is calcified.  Left Anterior Descending Prox LAD to Mid LAD lesion is 70% stenosed with 70% stenosed side branch in 2nd Diag. The lesion is located at the bend, segmental and eccentric. The lesion is moderately calcified. This segment was considered significant by CT FFR (0.76) and is angiographically significant. Mid LAD to Dist LAD lesion is 40% stenosed. Dist LAD lesion is 75% stenosed. The lesion is focal. Apical  First Diagonal Branch Vessel is small in size.  First Septal Branch Vessel is small in size.  Second Diagonal Branch Vessel is moderate in size.  Second Septal Branch Vessel is small in size.  Third Septal Branch Vessel is small in size.  Left Circumflex Vessel is large. The distal circumflex courses is a 2nd Mrg Prox Cx lesion is 45% stenosed. The lesion is focal and eccentric. The lesion is mildly calcified. Prox Cx to Mid Cx lesion is 75% stenosed. The lesion is located proximal to the major branch, segmental, eccentric  and irregular. Pressure wire/FFR was performed on the lesion. RFR as follows: OM3 =0.84, Ostial OM3 = 0.86, pre OM3 = 0.87; pre OM1 = 0.93 --> this indicates that the lesion becomes significant in the segment and does not require the ostial OM 3 lesion. Mid Cx lesion is 5% stenosed with 50% stenosed side branch in 3rd Mrg. The lesion is located at the bifurcation and focal. Just after lesion RFR 0.86 & mid branch 0.84  First Obtuse Marginal Branch Vessel is small in size.  Third Obtuse Marginal Branch Vessel is moderate in size.  Left Posterior Atrioventricular Artery Vessel is small in size.  Right Coronary Artery Ost RCA to Dist RCA lesion is 100% stenosed. The lesion is  chronically occluded with left-to-left collateral flow.  Right Posterior Descending Artery The vessel exhibits minimal luminal irregularities. Collaterals RPDA filled by collaterals from 2nd Sept.  Collaterals RPDA filled by collaterals from 3rd Sept.  RPDA lesion is 50% stenosed. The lesion is distal to major branch and focal.  Right Posterior Atrioventricular Artery Vessel is moderate in size. The vessel exhibits minimal luminal irregularities.  First Right Posterolateral Branch Collaterals 1st RPL filled by collaterals from Dist Cx.  Second Right Posterolateral Branch Vessel is small in size. Collaterals 2nd RPL filled by collaterals from 3rd Mrg.  Intervention  No interventions have been documented.     ECHOCARDIOGRAM  ECHOCARDIOGRAM COMPLETE 08/02/2020  Narrative ECHOCARDIOGRAM REPORT    Patient Name:   KATHLEEN LIKINS Date of Exam: 08/02/2020 Medical Rec #:  [id number]      Height:       70.0 in Accession #:    [id number]     Weight:       190.0 lb Date of Birth:  1940-03-03      BSA:          2.042 m Patient Age:    79 years       BP:           147/67 mmHg Patient Gender: M              HR:           54 bpm. Exam Location:  [address]  Procedure: 2D Echo, Cardiac Doppler and Color Doppler  Indications:    Z95.2 TAVR Evaluation  History:        Patient has prior history of Echocardiogram examinations, most recent 03/28/2020. 26 mm Edwards Sapien S3U.; Risk Factors:Hypertension, Diabetes and Dyslipidemia. Coronary artery disease. NSTEMI. Sleep apnea. TIA.  Sonographer:    Carl Coma RDCS Referring Phys: [id number] KATHRYN R THOMPSON  IMPRESSIONS   1. Left ventricular ejection fraction, by estimation, is 60 to 65%. The left ventricle has normal function. The left ventricle has no regional wall motion abnormalities. There is mild concentric left ventricular hypertrophy. Left ventricular diastolic parameters are indeterminate. Elevated left  ventricular end-diastolic pressure. 2. Right ventricular systolic function is normal. The right ventricular size is normal. Tricuspid regurgitation signal is inadequate for assessing PA pressure. 3. The mitral valve is normal in structure. Trivial mitral valve regurgitation. No evidence of mitral stenosis. 4. The aortic valve has been repaired/replaced. There is a 26 mm Sapien prosthetic, stented (TAVR) valve present in the aortic position. Aortic valve regurgitation is not visualized. No aortic stenosis is present. Aortic valve mean gradient measures 10.0 mmHg. Aortic valve Vmax measures 2.08 m/s. 5. The inferior vena cava is dilated in size with >50% respiratory variability, suggesting right atrial pressure of 8 mmHg. 6.  Compared to prior echo 03/2020, the mean AVG has increased from 6 to , peak velocity increased from 175 to 231cm/s, DI has decreased from 0.55 to 0.47 . No perivalvular AI.  FINDINGS Left Ventricle: Left ventricular ejection fraction, by estimation, is 60 to 65%. The left ventricle has normal function. The left ventricle has no regional wall motion abnormalities. The left ventricular internal cavity size was normal in size. There is mild concentric left ventricular hypertrophy. Left ventricular diastolic parameters are indeterminate. Elevated left ventricular end-diastolic pressure.  Right Ventricle: The right ventricular size is normal. No increase in right ventricular wall thickness. Right ventricular systolic function is normal. Tricuspid regurgitation signal is inadequate for assessing PA pressure.  Left Atrium: Left atrial size was normal in size.  Right Atrium: Right atrial size was normal in size.  Pericardium: There is no evidence of pericardial effusion.  Mitral Valve: The mitral valve is normal in structure. Trivial mitral valve regurgitation. No evidence of mitral valve stenosis.  Tricuspid Valve: The tricuspid valve is normal in structure. Tricuspid valve  regurgitation is trivial. No evidence of tricuspid stenosis.  Aortic Valve: The aortic valve has been repaired/replaced. Aortic valve regurgitation is not visualized. No aortic stenosis is present. Aortic valve mean gradient measures 10.0 mmHg. Aortic valve peak gradient measures 17.3 mmHg. There is a 26 mm Sapien prosthetic, stented (TAVR) valve present in the aortic position.  Pulmonic Valve: The pulmonic valve was normal in structure. Pulmonic valve regurgitation is trivial. No evidence of pulmonic stenosis.  Aorta: The aortic root is normal in size and structure.  Venous: The inferior vena cava is dilated in size with greater than 50% respiratory variability, suggesting right atrial pressure of 8 mmHg.  IAS/Shunts: No atrial level shunt detected by color flow Doppler.   LEFT VENTRICLE PLAX 2D LVIDd:         4.50 cm Diastology LVIDs:         3.10 cm LV e' medial:    5.44 cm/s LV PW:         1.40 cm LV E/e' medial:  17.2 LV IVS:        1.10 cm LV e' lateral:   6.96 cm/s LV E/e' lateral: 13.4   RIGHT VENTRICLE             IVC RV Basal diam:  3.80 cm     IVC diam: 2.10 cm RV S prime:     13.80 cm/s TAPSE (M-mode): 2.7 cm  LEFT ATRIUM             Index       RIGHT ATRIUM           Index LA diam:        4.80 cm 2.35 cm/m  RA Area:     14.00 cm LA Vol (A2C):   65.2 ml 31.92 ml/m RA Volume:   36.10 ml  17.68 ml/m LA Vol (A4C):   51.8 ml 25.36 ml/m LA Biplane Vol: 61.0 ml 29.87 ml/m AORTIC VALVE AV Vmax:           208.00 cm/s AV Vmean:          126.000 cm/s AV VTI:            0.445 m AV Peak Grad:      17.3 mmHg AV Mean Grad:      10.0 mmHg LVOT Vmax:         80.50 cm/s LVOT Vmean:  62.000 cm/s LVOT VTI:          0.207 m LVOT/AV VTI ratio: 0.47  AORTA Ao Root diam: 2.70 cm Ao Asc diam:  3.50 cm  MITRAL VALVE MV Area (PHT): 2.48 cm    SHUNTS MV Decel Time: 306 msec    Systemic VTI: 0.21 m MV E velocity: 93.60 cm/s MV A velocity: 92.20 cm/s MV E/A ratio:   1.02  Wilbert Bihari MD Electronically signed by Wilbert Bihari MD Signature Date/Time: 08/02/2020/11:30:23 AM    Final    MONITORS  LONG TERM MONITOR (3-14 DAYS) 04/21/2022  Narrative   Predominate rhythm is sinus rhythm.   1 run of nonsustained supraventricular tachycardia lasting 17 beats with max   Rare PVCs, PACs , rare couplets, bigeminy and  rare triplets   No serious arrhythmias observed   Patch Wear Time:  3 days and 4 hours (2024-02-07T14:21:27-0500 to 2024-02-10T18:46:03-498)  Patient had a min HR of 46 bpm, max HR of 141 bpm, and avg HR of 68 bpm. Predominant underlying rhythm was Sinus Rhythm. First Degree AV Block was present. 1 run of Supraventricular Tachycardia occurred lasting 17 beats with a max rate of 141 bpm (avg 108 bpm). Isolated SVEs were rare (<1.0%), SVE Couplets were rare (<1.0%), and SVE Triplets were rare (<1.0%). Isolated VEs were occasional (1.8%, 4952), VE Couplets were rare (<1.0%, 33), and no VE Triplets were present. Ventricular Bigeminy and Trigeminy were present.   CT SCANS  CT CORONARY MORPH W/CTA COR W/SCORE 07/14/2019  Addendum 07/14/2019  1:22 PM ADDENDUM REPORT: 07/14/2019 13:19  CLINICAL DATA:  Aortic stenosis  EXAM: Cardiac TAVR CT  TECHNIQUE: The patient was scanned on a Siemens Force 192 slice scanner. A 120 kV retrospective scan was triggered in the descending thoracic aorta at 111 HU's. Gantry rotation speed was 270 msecs and collimation was .9 mm. No beta blockade or nitro were given. The 3D data set was reconstructed in 5% intervals of the R-R cycle. Systolic and diastolic phases were analyzed on a dedicated work station using MPR, MIP and VRT modes. The patient received 80 cc of contrast.  FINDINGS: Aortic Valve: Calcified tri leaflet with restricted leaflet motion  Aorta: Normal arch vessels no aneurysm no coarctation  Sinotubular Junction: 24 mm heavily calcified  Ascending Thoracic Aorta: 29 mm  Aortic Arch: 26  mm  Descending Thoracic Aorta: 23 mm  Sinus of Valsalva Measurements:  Non-coronary: 31 mm  Right - coronary: 28 mm  Left - coronary: 29.2 mm  Coronary Artery Height above Annulus:  Left Main: 13.4 mm above annulus  Right Coronary: 14.7 mm above annulus but occluded  Virtual Basal Annulus Measurements:  Maximum/Minimum Diameter: 19.5 mm x 28.9 mm  Perimeter: 81 mm  Area: 512 mm2  Coronary Arteries: Sufficient height above annulus for deployment RCA occluded fills by collaterals from left system  Optimum Fluoroscopic Angle for Delivery: LAO 3 Caudal 3 degrees  IMPRESSION: 1. Calcified tri leaflet AV with annular area of 512 mm 2 suitable for a 26 mm Sapien 3 valve  2. Coronary arteries sufficient height above annulus for deployment Native RCA occluded  3.  Normal aortic root 2.9 cm with no aneurysm  4.  Optimum angiographic angle for deployment LAO 3 Caudal 3 degrees  5.  Significant calcification of the STJ  Maude Emmer   Electronically Signed By: Maude Emmer M.D. On: 07/14/2019 13:19  Narrative EXAM: OVER-READ INTERPRETATION  CT CHEST  The following report is an over-read performed by radiologist  Dr. Toribio Aye of [facility] on 07/14/2019. This over-read does not include interpretation of cardiac or coronary anatomy or pathology. The coronary calcium  score/coronary CTA interpretation by the cardiologist is attached.  COMPARISON:  Cardiac CTA 06/28/2019.  FINDINGS: Extracardiac findings will be described separately on dictation for contemporaneously obtained CTA chest, abdomen and pelvis.  IMPRESSION: Please see separate dictation for contemporaneously obtained CTA chest, abdomen and pelvis dated 07/14/2019 for full description of relevant extracardiac findings.  Electronically Signed: By: Toribio Aye M.D. On: 07/14/2019 12:03   CT SCANS  CT CORONARY FRACTIONAL FLOW RESERVE DATA PREP  06/28/2019  Narrative EXAM: FFRCT ANALYSIS: CT scan in 83 year old male with severe calcific plaque.  FINDINGS: FFRct analysis was performed on the original cardiac CT angiogram dataset. Diagrammatic representation of the FFRct analysis is provided in a separate PDF document in PACS. This dictation was created using the PDF document and an interactive 3D model of the results. 3D model is not available in the EMR/PACS. Normal FFR range is >0.80.  1. Left Main: 0.90  2. LAD: Proximal 0.90, mid 0.78, distal 0.68 - ABNORMAL  3. LCX: Proximal 0.87, mid 0.71, distal 0.57 - ABNORMAL  4. Ramus: n/a  5. RCA: Proximal 1.0, Mid OCCLUDED  - ABNORMAL  IMPRESSION: Severe three vessel coronary artery disease that is flow limiting in the mid LAD, mid Circumflex and totally occluded in mid RCA.  Recommend cardiac catheterization.  Oneil Parchment, MD [facility]  Note: These examples are not recommendations of [employer] and only provided as examples of what other customers are doing.   Electronically Signed By: Oneil Parchment MD On: 06/28/2019 18:18   CT CORONARY MORPH W/CTA COR W/SCORE 06/28/2019  Addendum 06/28/2019  2:35 PM ADDENDUM REPORT: 06/28/2019 14:32  EXAM: OVER-READ INTERPRETATION  CT CHEST  The following report is an over-read performed by radiologist Dr. Selinda Saas [facility] on 06/28/2019. This over-read does not include interpretation of cardiac or coronary anatomy or pathology. The coronary CTA interpretation by the cardiologist is attached.  COMPARISON:  11/11/2010 chest radiograph.  FINDINGS: Cardiovascular: Normal heart size. No significant pericardial effusion/thickening. Atherosclerotic nonaneurysmal thoracic aorta. Normal caliber pulmonary arteries. No central pulmonary emboli.  Mediastinum/Nodes: Unremarkable esophagus. No pathologically enlarged mediastinal or hilar lymph nodes.  Lungs/Pleura: No pneumothorax. No pleural effusion.  Calcified bilateral pulmonary granulomas, largest 1.3 cm in the medial right lower lobe. Solid 4 mm pulmonary nodule in the superior segment left lower lobe (series 13/image 1). No acute consolidative airspace disease, lung masses or additional significant pulmonary nodules.  Upper abdomen: No acute abnormality.  Musculoskeletal: No aggressive appearing focal osseous lesions. Mild thoracic spondylosis.  IMPRESSION: 1. Solitary 4 mm solid pulmonary nodule in the superior segment left lower lobe. No follow-up needed if patient is low-risk. Non-contrast chest CT can be considered in 12 months if patient is high-risk. This recommendation follows the consensus statement: Guidelines for Management of Incidental Pulmonary Nodules Detected on CT Images: From the Fleischner Society 2017; Radiology 2017; 284:228-243. 2.  Aortic Atherosclerosis (ICD10-I70.0).   Electronically Signed By: Selinda DELENA Blue M.D. On: 06/28/2019 14:32  Narrative CLINICAL DATA:  83 year old with chest pain  EXAM: Cardiac/Coronary  CTA  TECHNIQUE: The patient was scanned on a [employer].  FINDINGS: A 110 kV prospective scan was triggered in the descending thoracic aorta at 111 HU's. Axial non-contrast 3 mm slices were carried out through the heart. The data set was analyzed on a dedicated work station and scored  using the Agatson method. Gantry rotation speed was 250 msecs and collimation was .6 mm. Beta blockade and 0.8 mg of sl NTG was given. The 3D data set was reconstructed in 5% intervals of the 67-82 % of the R-R cycle. Diastolic phases were analyzed on a dedicated work station using MPR, MIP and VRT modes. The patient received 80 cc of contrast.  Aorta:  Normal size.  Scattered calcifications.  No dissection.  Aortic Valve:  Trileaflet.  Moderate aortic valve calcifications.  Coronary Arteries:  Normal coronary origin.  Right dominance.  RCA is a large dominant artery that gives rise to  PDA and PLA. There is severe calcified plaque, proximal predominance. Possible severe luminal disease (75-99%). Sending for FFR analysis.  Left main is a large artery that gives rise to LAD and LCX arteries. There is calcified plaque (appears non flow limiting 0-24%)  LAD is a large vessel that has severe diffuse calcified plaque. Possible severe stenosis proximally 75-99%. Sending for FFR. Possible severe distal calcified lesion 75-99% distally.  LCX is a non-dominant artery that gives rise to one large OM1 branch. There is severe calcified plaque that may be flow limiting 50-74% in the proximal region. Sending for FFR.  Other findings:  Normal pulmonary vein drainage into the left atrium.  Normal left atrial appendage without a thrombus.  Normal size of the pulmonary artery.  IMPRESSION: 1. Coronary calcium  score of 2633. This was 62 percentile for age and sex matched control.  2. Normal coronary origin with right dominance.  3.  Aortic atherosclerosis.  Moderate aortic valve sclerosis.  4. Severe multivessel calcified plaque, which may be flow limiting. Sending for FFR analysis.  Oneil Parchment, MD [facility]  Electronically Signed: By: Oneil Parchment MD On: 06/28/2019 13:48     ______________________________________________________________________________________________      Risk Assessment/Calculations          Physical Exam VS:  BP (!) 173/62 (BP Location: Left Arm, Patient Position: Sitting, Cuff Size: Normal)   Pulse 77   Ht 5' 10 (1.778 m)   Wt 191 lb 12.8 oz (87 kg)   SpO2 92%   BMI 27.52 kg/m        Wt Readings from Last 3 Encounters:  09/23/23 191 lb 2 oz (86.7 kg)  09/22/23 191 lb 12.8 oz (87 kg)  09/16/23 189 lb 3.2 oz (85.8 kg)    GEN: Well nourished, well developed in no acute distress NECK: No JVD; No carotid bruits CARDIAC: RRR, no murmurs, rubs, gallops RESPIRATORY:  Clear to auscultation without rales, wheezing or rhonchi  ABDOMEN: Soft,  non-tender, non-distended EXTREMITIES:  No edema; No deformity   ASSESSMENT AND PLAN  CAD: Chest pain is atypical, it does not occur with activity but occurs more so with food.  Patient is not a good candidate for any invasive study.  Will continue medical management  History of TAVR: Stable on last echocardiogram in June 2022  Hypertension: Blood pressure elevated, will switch losartan  to irbesartan  150 mg daily.  If systolic blood pressure remain elevated after 2 weeks, patient has been instructed to increase irbesartan  to 300 mg daily  Hyperlipidemia: On rosuvastatin   History of CVA: no recent recurrence  Presumed stage IV renal cell carcinoma: Followed by oncology service.  Has appointment to see radiation oncologist tomorrow.       Dispo: Follow-up in 2 months for reassessment.  Signed, Scot Ford, PA

## 2023-09-23 ENCOUNTER — Ambulatory Visit
Admission: RE | Admit: 2023-09-23 | Discharge: 2023-09-23 | Disposition: A | Discharge status: Home / Self Care | Source: Ambulatory Visit | Attending: Radiation Oncology | Admitting: Radiation Oncology

## 2023-09-23 ENCOUNTER — Encounter: Payer: Self-pay | Admitting: Radiation Oncology

## 2023-09-23 VITALS — BP 172/57 | HR 72 | Temp 97.1°F | Resp 18 | Ht 70.0 in | Wt 191.1 lb

## 2023-09-23 DIAGNOSIS — C7801 Secondary malignant neoplasm of right lung: Secondary | ICD-10-CM | POA: Diagnosis not present

## 2023-09-23 DIAGNOSIS — C7951 Secondary malignant neoplasm of bone: Secondary | ICD-10-CM

## 2023-09-23 DIAGNOSIS — I350 Nonrheumatic aortic (valve) stenosis: Secondary | ICD-10-CM | POA: Insufficient documentation

## 2023-09-23 DIAGNOSIS — M316 Other giant cell arteritis: Secondary | ICD-10-CM | POA: Insufficient documentation

## 2023-09-23 DIAGNOSIS — I2510 Atherosclerotic heart disease of native coronary artery without angina pectoris: Secondary | ICD-10-CM | POA: Diagnosis not present

## 2023-09-23 DIAGNOSIS — G4730 Sleep apnea, unspecified: Secondary | ICD-10-CM | POA: Diagnosis not present

## 2023-09-23 DIAGNOSIS — E785 Hyperlipidemia, unspecified: Secondary | ICD-10-CM | POA: Insufficient documentation

## 2023-09-23 DIAGNOSIS — I252 Old myocardial infarction: Secondary | ICD-10-CM | POA: Insufficient documentation

## 2023-09-23 DIAGNOSIS — I493 Ventricular premature depolarization: Secondary | ICD-10-CM | POA: Diagnosis not present

## 2023-09-23 DIAGNOSIS — Z8673 Personal history of transient ischemic attack (TIA), and cerebral infarction without residual deficits: Secondary | ICD-10-CM | POA: Diagnosis not present

## 2023-09-23 DIAGNOSIS — C7802 Secondary malignant neoplasm of left lung: Secondary | ICD-10-CM | POA: Diagnosis not present

## 2023-09-23 DIAGNOSIS — I110 Hypertensive heart disease with heart failure: Secondary | ICD-10-CM | POA: Insufficient documentation

## 2023-09-23 DIAGNOSIS — I5032 Chronic diastolic (congestive) heart failure: Secondary | ICD-10-CM | POA: Diagnosis not present

## 2023-09-23 DIAGNOSIS — Z87891 Personal history of nicotine dependence: Secondary | ICD-10-CM | POA: Insufficient documentation

## 2023-09-23 DIAGNOSIS — Z7984 Long term (current) use of oral hypoglycemic drugs: Secondary | ICD-10-CM | POA: Diagnosis not present

## 2023-09-23 DIAGNOSIS — E119 Type 2 diabetes mellitus without complications: Secondary | ICD-10-CM | POA: Diagnosis not present

## 2023-09-23 DIAGNOSIS — Z79899 Other long term (current) drug therapy: Secondary | ICD-10-CM | POA: Diagnosis not present

## 2023-09-23 DIAGNOSIS — C642 Malignant neoplasm of left kidney, except renal pelvis: Secondary | ICD-10-CM | POA: Insufficient documentation

## 2023-09-23 DIAGNOSIS — Z794 Long term (current) use of insulin: Secondary | ICD-10-CM | POA: Diagnosis not present

## 2023-09-23 DIAGNOSIS — Z7982 Long term (current) use of aspirin: Secondary | ICD-10-CM | POA: Insufficient documentation

## 2023-09-24 ENCOUNTER — Other Ambulatory Visit: Payer: Self-pay

## 2023-09-24 MED ORDER — RANOLAZINE ER 500 MG PO TB12: 500.0000 mg | ORAL_TABLET | Freq: Two times a day (BID) | ORAL | 3 refills | Status: AC

## 2023-09-27 ENCOUNTER — Other Ambulatory Visit: Payer: Self-pay | Admitting: Radiation Oncology

## 2023-09-27 DIAGNOSIS — C7801 Secondary malignant neoplasm of right lung: Secondary | ICD-10-CM

## 2023-09-27 DIAGNOSIS — C7951 Secondary malignant neoplasm of bone: Secondary | ICD-10-CM

## 2023-09-28 ENCOUNTER — Inpatient Hospital Stay

## 2023-09-29 ENCOUNTER — Ambulatory Visit (HOSPITAL_COMMUNITY)
Admission: RE | Admit: 2023-09-29 | Discharge: 2023-09-29 | Disposition: A | Discharge status: Home / Self Care | Source: Ambulatory Visit | Attending: Cardiovascular Disease | Admitting: Cardiovascular Disease

## 2023-09-29 DIAGNOSIS — I6523 Occlusion and stenosis of bilateral carotid arteries: Secondary | ICD-10-CM | POA: Diagnosis present

## 2023-10-04 ENCOUNTER — Ambulatory Visit (HOSPITAL_COMMUNITY)
Admission: RE | Admit: 2023-10-04 | Discharge: 2023-10-04 | Disposition: A | Discharge status: Home / Self Care | Source: Ambulatory Visit | Attending: Cardiology | Admitting: Cardiology

## 2023-10-04 ENCOUNTER — Ambulatory Visit: Payer: Self-pay | Admitting: Physician Assistant

## 2023-10-04 DIAGNOSIS — Z952 Presence of prosthetic heart valve: Secondary | ICD-10-CM | POA: Insufficient documentation

## 2023-10-04 LAB — ECHOCARDIOGRAM COMPLETE
AR max vel: 1.41 cm2
AV Area VTI: 1.41 cm2
AV Area mean vel: 1.46 cm2
AV Mean grad: 10.8 mmHg
AV Peak grad: 20.8 mmHg
Ao pk vel: 2.28 m/s
S' Lateral: 3.03 cm

## 2023-10-05 ENCOUNTER — Other Ambulatory Visit: Payer: Self-pay

## 2023-10-05 DIAGNOSIS — I6523 Occlusion and stenosis of bilateral carotid arteries: Secondary | ICD-10-CM

## 2023-10-06 ENCOUNTER — Encounter

## 2023-10-07 ENCOUNTER — Inpatient Hospital Stay: Admitting: Nurse Practitioner

## 2023-10-14 ENCOUNTER — Telehealth: Payer: Self-pay

## 2023-10-14 NOTE — Telephone Encounter (Signed)
 Called patient and rescheduled missed palliative appt, no further needs at this time.

## 2023-10-22 ENCOUNTER — Telehealth: Payer: Self-pay

## 2023-10-22 NOTE — Telephone Encounter (Signed)
 I talked with Alexis Griffith and informed him of his re-scheduled appointment for 10/30.

## 2023-10-26 ENCOUNTER — Encounter: Payer: Self-pay | Admitting: Nurse Practitioner

## 2023-10-26 ENCOUNTER — Inpatient Hospital Stay: Admitting: Nurse Practitioner

## 2023-10-26 VITALS — BP 155/50 | HR 74 | Temp 98.2°F | Resp 18 | Wt 189.4 lb

## 2023-10-26 DIAGNOSIS — Z515 Encounter for palliative care: Secondary | ICD-10-CM

## 2023-10-26 DIAGNOSIS — G893 Neoplasm related pain (acute) (chronic): Secondary | ICD-10-CM | POA: Diagnosis not present

## 2023-10-26 DIAGNOSIS — C642 Malignant neoplasm of left kidney, except renal pelvis: Secondary | ICD-10-CM | POA: Diagnosis not present

## 2023-10-26 DIAGNOSIS — K5903 Drug induced constipation: Secondary | ICD-10-CM

## 2023-10-26 DIAGNOSIS — C7951 Secondary malignant neoplasm of bone: Secondary | ICD-10-CM

## 2023-10-26 DIAGNOSIS — C7801 Secondary malignant neoplasm of right lung: Secondary | ICD-10-CM

## 2023-10-26 DIAGNOSIS — C7802 Secondary malignant neoplasm of left lung: Secondary | ICD-10-CM

## 2023-10-26 MED ORDER — SENNOSIDES-DOCUSATE SODIUM 8.6-50 MG PO TABS: 2.0000 | ORAL_TABLET | Freq: Every evening | ORAL | 3 refills | Status: AC | PRN

## 2023-10-26 MED ORDER — TRAMADOL HCL 50 MG PO TABS: 50.0000 mg | ORAL_TABLET | Freq: Three times a day (TID) | ORAL | 0 refills | Status: DC | PRN

## 2023-11-03 NOTE — Progress Notes (Signed)
 Palliative Medicine [facility]  Telephone:(336) [phone] Fax:(336) [phone]   Name: Alexis Griffith Date: 11/03/2023 MRN: [id number]  DOB: Aug 17, 1940  Patient Care Team: Tisovec, Richard W, MD as PCP - General (Internal Medicine) Fernande Elspeth BROCKS, MD as PCP - Electrophysiology (Cardiology) Wonda Sharper, MD as PCP - Structural Heart (Cardiology) [place], Peter M, MD as PCP - Cardiology (Cardiology)    REASON FOR CONSULTATION: Alexis Griffith is a 83 y.o. 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  Palliative is seeing patient for symptom management and goals of care.    SOCIAL HISTORY:     reports that he quit smoking about 48 years ago. His smoking use included cigarettes. He has never used smokeless tobacco. He reports that he does not drink alcohol  and does not use drugs.  ADVANCE DIRECTIVES:  Patient has documents. Encouraged to bring in a copy. Wife is Clinical research associate.   CODE STATUS: DNR  PAST MEDICAL HISTORY: Past Medical History:  Diagnosis Date   Anxiety    Bladder neck obstruction 06/05/2013   CAD (coronary artery disease)    Carotid artery disease (HCC)    CTA 08/2019 of the head and neck show moderate 65 to 75% bilateral ICA stenoses    Chronic diastolic CHF (congestive heart failure) (HCC)    Diabetes mellitus type 2 in nonobese (HCC)    Excessive urination at night 06/05/2013   Giant cell arteritis (HCC) 05/17/2012   Hyperlipidemia    Hypertension    Hypertensive urgency    Hypomagnesemia 09/11/2019   Mild anemia 09/11/2019   NSTEMI (non-ST elevated myocardial infarction) (HCC) 09/09/2019   Orthostatic hypotension 09/11/2019   Premature atrial contractions 09/12/2019    Pulmonary nodules    PVC (premature ventricular contraction)    Renal mass    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 Wonda & Dr. Lucas   Severe aortic stenosis    Sinus bradycardia on ECG    Sleep apnea    Squamous cell carcinoma of tongue [facility]) September 2012   Followed by Dr. Mable.   Stroke [facility])    TIA (transient ischemic attack)    Urinary retention 09/11/2019    PAST SURGICAL HISTORY:  Past Surgical History:  Procedure Laterality Date   CATARACT EXTRACTION, BILATERAL  10/2015, and 11/2015   with adding  TORIC lenses bilaterally    CORONARY PRESSURE/FFR STUDY N/A 07/07/2019   Procedure: INTRAVASCULAR PRESSURE WIRE/FFR STUDY;  Surgeon: Anner Alm LELON, MD;  Location: [facility] INVASIVE CV LAB;  Service: Cardiovascular;  Laterality: N/A;   LEFT HEART CATH AND CORONARY ANGIOGRAPHY N/A 07/07/2019   Procedure: LEFT HEART CATH AND CORONARY ANGIOGRAPHY;  Surgeon: Anner Alm LELON, MD;  Location: [facility] INVASIVE CV LAB;  Service: Cardiovascular;  Laterality: N/A;   SQUAMOUS CELL CARCINOMA EXCISION     tongue   TEE WITHOUT CARDIOVERSION N/A 08/15/2019   Procedure: TRANSESOPHAGEAL ECHOCARDIOGRAM (TEE);  Surgeon: Wonda Sharper, MD;  Location: [facility] OR;  Service: Open Heart Surgery;  Laterality: N/A;   TRANSCATHETER AORTIC VALVE REPLACEMENT, TRANSFEMORAL N/A 08/15/2019   Procedure: TRANSCATHETER AORTIC VALVE REPLACEMENT, TRANSFEMORAL using Edwards [employer] SAPIEN 3 Ultra 26 mm THV.;  Surgeon: Wonda Sharper, MD;  Location: [facility] OR;  Service: Open Heart Surgery;  Laterality: N/A;    HEMATOLOGY/ONCOLOGY HISTORY:  Oncology History   No history exists.    ALLERGIES:  is allergic to macrodantin [nitrofurantoin], tape, sulfa antibiotics, bioflavonoid products, lisinopril, metformin  hcl, nutritional supplements, nutritional supplements, penicillins, pioglitazone, and sulfamethoxazole-trimethoprim.  MEDICATIONS:  Current  Outpatient Medications  Medication Sig Dispense Refill   senna-docusate (SENNA S) 8.6-50 MG tablet Take 2 tablets by mouth at bedtime as needed for mild constipation. 60 tablet 3   traMADol  (ULTRAM ) 50 MG tablet Take 1 tablet (50 mg total) by mouth every 8 (eight) hours as needed. 30 tablet 0   acetaminophen  (TYLENOL ) 325 MG tablet Take 650 mg by mouth every 6 (six) hours as needed for mild pain.     aspirin  81 MG chewable tablet Chew 1 tablet (81 mg total) by mouth daily. 360 tablet 0   carvedilol  (COREG ) 25 MG tablet Take 1 tablet (25 mg total) by mouth 2 (two) times daily. 180 tablet 3   Choline Fenofibrate  (FENOFIBRIC ACID) 135 MG CPDR Take 1 capsule by mouth daily.     clonazePAM  (KLONOPIN ) 0.5 MG tablet Take 0.5 mg by mouth daily.     doxazosin  (CARDURA ) 4 MG tablet TAKE ONE TABLET BY MOUTH DAILY 90 tablet 3   FARXIGA 10 MG TABS tablet Take 10 mg by mouth daily.     glucose blood test strip 1 each by Other route as needed.      insulin  glargine (LANTUS ) 100 UNIT/ML injection Inject 70 Units into the skin daily.     irbesartan  (AVAPRO ) 150 MG tablet Take 1 tablet (150 mg total) by mouth daily. 90 tablet 1   isosorbide  mononitrate (IMDUR ) 60 MG 24 hr tablet Take 1 tablet (60 mg total) by mouth daily. 90 tablet 2   Lancets (ONETOUCH DELICA PLUS LANCET33G) MISC 1 each by Other route daily.      magnesium  oxide (MAG-OX) 400 MG tablet Take 400 mg by mouth daily.     nitroGLYCERIN  (NITROSTAT ) 0.4 MG SL tablet PLACE 1 TABLET UNDER THE TONGUE EVERY 5 MINUTES AS NEEDED FOR CHEST PAIN UP TO 3 DOSES 25 tablet 9   ONE TOUCH ULTRA TEST test strip 1 each by Other route daily.      ranolazine  (RANEXA ) 500 MG 12 hr tablet Take 1 tablet (500 mg total) by mouth 2 (two) times daily. 180 tablet 3   rosuvastatin  (CRESTOR ) 10 MG tablet TAKE 1 TABLET by mouth ONCE DAILY. (Patient not taking: Reported on 09/23/2023) 90 tablet 2   No current facility-administered medications for this visit.    VITAL SIGNS: BP  (!) 155/50 (BP Location: Left Arm, Patient Position: Sitting)   Pulse 74   Temp 98.2 F (36.8 C) (Temporal)   Resp 18   Wt 189 lb 6.4 oz (85.9 kg)   SpO2 94%   BMI 27.18 kg/m  Filed Weights   10/26/23 1416  Weight: 189 lb 6.4 oz (85.9 kg)    Estimated body mass index is 27.18 kg/m as calculated from the following:   Height as of 09/23/23: 5' 10 (1.778 m).   Weight as of this encounter: 189 lb 6.4 oz (85.9 kg).  LABS: CBC:    Component Value Date/Time   WBC 6.2 08/19/2023 1058   WBC 6.6 05/19/2020 1853   HGB 13.0 08/19/2023 1058   HCT 40.7 08/19/2023 1058   PLT 205 08/19/2023 1058   MCV 80 08/19/2023 1058   NEUTROABS 5.7 09/19/2019 1432  NEUTROABS 4.3 12/20/2013 1442   LYMPHSABS 1.2 09/19/2019 1432   LYMPHSABS 2.2 12/20/2013 1442   MONOABS 0.6 09/19/2019 1432   EOSABS 0.2 09/19/2019 1432   EOSABS 0.1 12/20/2013 1442   BASOSABS 0.0 09/19/2019 1432   BASOSABS 0.0 12/20/2013 1442   Comprehensive Metabolic Panel:    Component Value Date/Time   NA 137 08/19/2023 1058   K 4.8 08/19/2023 1058   CL 102 08/19/2023 1058   CO2 20 08/19/2023 1058   BUN 26 08/19/2023 1058   CREATININE 1.28 (H) 08/19/2023 1058   CREATININE 1.14 03/25/2015 0920   GLUCOSE 221 (H) 08/19/2023 1058   GLUCOSE 164 (H) 05/19/2020 1853   CALCIUM  10.2 08/19/2023 1058   AST 14 12/23/2021 0955   ALT 10 10/20/2022 1400   ALKPHOS 51 12/23/2021 0955   BILITOT 0.3 12/23/2021 0955   PROT 6.4 12/23/2021 0955   ALBUMIN 4.0 12/23/2021 0955    RADIOGRAPHIC STUDIES: ECHOCARDIOGRAM COMPLETE Result Date: 10/04/2023    ECHOCARDIOGRAM REPORT   Patient Name:   WALDRON GERRY Date of Exam: 10/04/2023 Medical Rec #:  [id number]      Height:       70.0 in Accession #:    [id number]     Weight:       191.1 lb Date of Birth:  [birth year]      BSA:          2.047 m Patient Age:    82 years       BP:           179/79 mmHg Patient Gender: M              HR:           71 bpm. Exam Location:  Outpatient Procedure: 2D Echo,  Cardiac Doppler, Color Doppler and Strain Analysis (Both            Spectral and Color Flow Doppler were utilized during procedure). Indications:    S/P TAVR Z95.2  History:        Patient has prior history of Echocardiogram examinations. NSTEMI                 and CAD, TIA and Stroke, Aortic Valve Disease, Arrythmias:Sinus                 [place] and PVC; Risk Factors:Hypertension, Diabetes and                 Hyperlipidemia. S/P Edwards Sapien TAVR, placed 08/15/2019.  Sonographer:    Sharlet Hamilton RDCS Referring Phys: [id number] SHENG L HALEY IMPRESSIONS  1. Left ventricular ejection fraction, by estimation, is 60 to 65%. The left ventricle has normal function. The left ventricle has no regional wall motion abnormalities. There is mild concentric left ventricular hypertrophy. Left ventricular diastolic parameters are consistent with Grade I diastolic dysfunction (impaired relaxation). The average left ventricular global longitudinal strain is -14.8 %.  2. Right ventricular systolic function is normal. The right ventricular size is normal. Tricuspid regurgitation signal is inadequate for assessing PA pressure.  3. Left atrial size was moderately dilated.  4. The mitral valve is degenerative. Mild mitral valve regurgitation. No evidence of mitral stenosis.  5. The aortic valve has been repaired/replaced. Aortic valve regurgitation is not visualized. No aortic stenosis is present. Echo findings are consistent with normal structure and function of the aortic valve prosthesis. Aortic valve area, by VTI measures 1.41 cm. Aortic valve mean gradient measures 10.8 mmHg. Aortic valve Vmax  measures 2.28 m/s.  6. The inferior vena cava is dilated in size with >50% respiratory variability, suggesting right atrial pressure of 8 mmHg. FINDINGS  Left Ventricle: Left ventricular ejection fraction, by estimation, is 60 to 65%. The left ventricle has normal function. The left ventricle has no regional wall motion abnormalities. The  average left ventricular global longitudinal strain is -14.8 %. Strain was performed and the global longitudinal strain is indeterminate. The left ventricular internal cavity size was normal in size. There is mild concentric left ventricular hypertrophy. Left ventricular diastolic parameters are consistent with Grade  I diastolic dysfunction (impaired relaxation). Right Ventricle: The right ventricular size is normal. No increase in right ventricular wall thickness. Right ventricular systolic function is normal. Tricuspid regurgitation signal is inadequate for assessing PA pressure. Left Atrium: Left atrial size was moderately dilated. Right Atrium: Right atrial size was normal in size. Pericardium: There is no evidence of pericardial effusion. Mitral Valve: The mitral valve is degenerative in appearance. Mild mitral valve regurgitation. No evidence of mitral valve stenosis. Tricuspid Valve: The tricuspid valve is normal in structure. Tricuspid valve regurgitation is trivial. No evidence of tricuspid stenosis. Aortic Valve: The aortic valve has been repaired/replaced. Aortic valve regurgitation is not visualized. No aortic stenosis is present. Aortic valve mean gradient measures 10.8 mmHg. Aortic valve peak gradient measures 20.8 mmHg. Aortic valve area, by VTI measures 1.41 cm. Echo findings are consistent with normal structure and function of the aortic valve prosthesis. Pulmonic Valve: The pulmonic valve was normal in structure. Pulmonic valve regurgitation is trivial. No evidence of pulmonic stenosis. Aorta: The aortic root is normal in size and structure. Venous: The inferior vena cava is dilated in size with greater than 50% respiratory variability, suggesting right atrial pressure of 8 mmHg. IAS/Shunts: No atrial level shunt detected by color flow Doppler.  LEFT VENTRICLE PLAX 2D LVIDd:         4.62 cm   Diastology LVIDs:         3.03 cm   LV e' medial:    6.09 cm/s LV PW:         1.23 cm   LV E/e' medial:   14.1 LV IVS:        1.21 cm   LV e' lateral:   8.59 cm/s LVOT diam:     2.10 cm   LV E/e' lateral: 10.0 LV SV:         72 LV SV Index:   35        2D Longitudinal Strain LVOT Area:     3.46 cm  2D Strain GLS (A4C):   -15.0 %                          2D Strain GLS (A3C):   -13.2 %                          2D Strain GLS (A2C):   -16.0 %                          2D Strain GLS Avg:     -14.8 % RIGHT VENTRICLE             IVC RV Basal diam:  3.28 cm     IVC diam: 2.18 cm RV Mid diam:    2.44 cm RV S prime:     15.30 cm/s TAPSE (  M-mode): 2.9 cm LEFT ATRIUM             Index        RIGHT ATRIUM           Index LA diam:        3.80 cm 1.86 cm/m   RA Pressure: 8.00 mmHg LA Vol (A2C):   49.3 ml 24.08 ml/m  RA Area:     14.40 cm LA Vol (A4C):   62.0 ml 30.28 ml/m  RA Volume:   34.00 ml  16.61 ml/m LA Biplane Vol: 59.5 ml 29.06 ml/m  AORTIC VALVE AV Area (Vmax):    1.41 cm AV Area (Vmean):   1.46 cm AV Area (VTI):     1.41 cm AV Vmax:           228.00 cm/s AV Vmean:          151.500 cm/s AV VTI:            0.510 m AV Peak Grad:      20.8 mmHg AV Mean Grad:      10.8 mmHg LVOT Vmax:         92.80 cm/s LVOT Vmean:        63.900 cm/s LVOT VTI:          0.208 m LVOT/AV VTI ratio: 0.41  AORTA Ao Root diam: 2.90 cm Ao Asc diam:  3.30 cm MV E velocity: 85.90 cm/s   TRICUSPID VALVE MV A velocity: 118.00 cm/s  Estimated RAP:  8.00 mmHg MV E/A ratio:  0.73                             SHUNTS                             Systemic VTI:  0.21 m                             Systemic Diam: 2.10 cm Toribio Fuel MD Electronically signed by Toribio Fuel MD Signature Date/Time: 10/04/2023/11:15:46 PM    Final     PERFORMANCE STATUS (ECOG) : 1 - Symptomatic but completely ambulatory  Review of Systems Unless otherwise noted, a complete review of systems is negative.  Physical Exam General: NAD Cardiovascular: regular rate and rhythm Pulmonary: clear ant fields Abdomen: soft, nontender, + bowel sounds Extremities: no  edema, no joint deformities Skin: no rashes Neurological: Alert and oriented x3  IMPRESSION:  Discussed the use of AI scribe software for clinical note transcription with the patient, who gave verbal consent to proceed.  History of Present Illness Alexis Griffith is an 83 year old male with a history of stage IV RCC, TAVR and diabetes who presents to clinic for initial palliative visit. He complains of persistent fatigue and pain. Patient is alert an able to engage in appropriate discussions. He is accompanied by his wife.   I introduced myself, Maygan RN, and Palliative's role in collaboration with the oncology team. Concept of Palliative Care was introduced as specialized medical care for people and their families living with serious illness.  It focuses on providing relief from the symptoms and stress of a serious illness.  The goal is to improve quality of life for both the patient and the family. Values and goals of care important to patient and family were attempted to be  elicited.  Mr. Kunert lives in the home with his wife of more than 64 years. They have 2 daughters. He is retired from [employer]. He was born and raised in [place] .   Patient shares he is able to perform most ADLs independently with some limitation due to fatigue and discomfort. He reports  I haven't felt good in five years! Sharing he has never bounced back post TAVR.   He has been experiencing persistent fatigue and lack of energy for the past five years. The fatigue is severe with no improvement in energy levels. He also experiences nightmares, which he attributes to the surgery, and prefers not to take medication for this issue.  He takes 16 pills a day and wishes to avoid additional medications. His current medications include Klonopin , 0.5 mg at night, and Tylenol  for pain, although Tylenol  does not provide significant relief. He was previously on a higher dose of Klonopin  but was tapered off,  which he describes as a difficult process.  He has a history of diabetes and reports nocturia, getting up three to four times a night to urinate. He also experiences constipation, which he attributes to his medications. He takes stool softeners with Tylenol , but they do not seem to help significantly. Education provided and patient instructed to begin taking Senna-S 2 tablets at bedtime.   His appetite remains good, and his weight is stable at 189 pounds. He attributes his good appetite to his wife's cooking, noting that he eats three meals a day.  Mr. Timmons experiences pain throughout his body, described as non-severe and longstanding, predating the last five years. He only takes Tylenol  for this pain, which provides minimal relief, but continues to take it as it is the only option available to him.  We discussed his discomfort at length. Education provided on use of Tramadol  50mg  every 6-8 hours as needed, including efficacy, administration, and potential side effects. He and wife verbalized understanding.   Goals of Care We discussed his current illness and what it means in the larger context of his on-going co-morbidities. Natural disease trajectory and expectations were discussed.  Mr. Hyams and his wife are realistic in his understanding of cancer diagnosis and prognosis. He and wife reports that he does have a documented advance directive. He shares his wife with their daughters support would be his medical decision maker.   I empathetically approached discussions on code status and healthcare limitations. Patient encouraged to bring in a copy of his directives. He confirms wishes for DNR/DNI. Out of facility form completed today and provided to patient. He would not want artificial feedings. I introduced MOST form and education provided. Patient would like to review and complete during follow-up visit.   I discussed the importance of continued conversation with family and their medical  providers regarding overall plan of care and treatment options, ensuring decisions are within the context of the patients values and GOCs. Assessment & Plan Established therapeutic relationship. Education provided on palliative's role in collaboration with their Oncology/Radiation team.  Malignant neoplasm under active management Currently considering chemotherapy and radiation therapy. Discussed differences between chemotherapy and radiation therapy, including administration methods and potential side effects. Radiation therapy options include a condensed version of five days with higher doses or a spread-out version over ten days with lower doses. Concerns about potential side effects and worsening of current health status were expressed. Shared decision-making involved considering the impact of treatment on current quality of life and potential side effects.  Chronic  generalized pain Chronic pain present for years, not severe but persistent. Tylenol  is currently used but is ineffective. Discussed the use of tramadol  50 mg as a stronger alternative to Tylenol  for pain relief. Advised to take tramadol  up to every eight hours as needed, but not within two hours of Klonopin  to avoid excessive drowsiness. - Prescribe tramadol  50 mg, take one tablet up to every eight hours as needed - Advise not to take tramadol  within two hours of Klonopin   Nightmare disorder following cancer treatment Experiencing horrific nightmares since cancer treatment.  Unspecified diabetes mellitus Diabetes management complicated by insurance issues with diabetic supplies. Currently using a preferred meter and strips despite insurance recommendations to switch to a cheaper option.  Chronic constipation Chronic constipation likely related to medication use. Current use of stool softeners is ineffective. Discussed the use of Senna as an alternative stool softener, available over the counter. - Recommend Senna, two tablets at  bedtime  Goals of Care Discussed advance directives and healthcare power of attorney. Wishes include do not resuscitate (DNR) and no feeding tube. Provided information on medical order scope of treatment (MOST) form to be reviewed at home. - Provide MOST form for review at home -DNR/DNI-Out of facility form completed. Original provided to patient.  -Patient has documented advanced directives. Encouraged to bring in for filing.   Follow-Up Plan to follow up with a phone call in three to four weeks and an in-person visit with Doctor Tina on October 30th. - Schedule follow-up phone call in three to four weeks - Schedule in-person visit with Doctor Tina on October 30th  Patient expressed understanding and was in agreement with this plan. He also understands that He can call the clinic at any time with any questions, concerns, or complaints.   Thank you for your referral and allowing Palliative to assist in Mr. Isreal [facility] care.   Number and complexity of problems addressed: HIGH - 1 or more chronic illnesses with SEVERE exacerbation, progression, or side effects of treatment - advanced cancer, pain. Any controlled substances utilized were prescribed in the context of palliative care.  Visit consisted of counseling and education dealing with the complex and emotionally intense issues of symptom management and palliative care in the setting of serious and potentially life-threatening illness.  Signed by: Levon Borer, AGPCNP-BC Palliative Medicine Team/[facility]

## 2023-11-15 ENCOUNTER — Telehealth: Payer: Self-pay | Admitting: Physician Assistant

## 2023-11-15 ENCOUNTER — Other Ambulatory Visit: Payer: Self-pay | Admitting: Physician Assistant

## 2023-11-15 MED ORDER — HYDRALAZINE HCL 25 MG PO TABS: 25.0000 mg | ORAL_TABLET | Freq: Three times a day (TID) | ORAL | 3 refills | Status: DC

## 2023-11-15 MED ORDER — IRBESARTAN 300 MG PO TABS: 300.0000 mg | ORAL_TABLET | Freq: Every day | ORAL | 11 refills | Status: AC

## 2023-11-15 NOTE — Telephone Encounter (Signed)
 I called and spoke with Alexis Griffith, he has been taking 100 mg of losartan  on top of the irbesartan  300 mg at night because his blood pressure has been running high recently.  I told him that it is not advised to take losartan  and irbesartan  together since they are in the same class of medication.  He has a history of leg edema amlodipine .  I will prescribe hydralazine  25 mg 3 times a day so he does not have to take the losartan .  I will see him on the 23rd which is next week and at that time if blood pressure remains elevated, I may increase the hydralazine  further.  Prescription of the hydralazine  has been sent to his pharmacy

## 2023-11-15 NOTE — Telephone Encounter (Signed)
 Pt c/o medication issue:  1. Name of Medication: irbesartan  (AVAPRO ) 150 MG tablet  carvedilol  (COREG ) 25 MG tablet   2. How are you currently taking this medication (dosage and times per day)? As written   3. Are you having a reaction (difficulty breathing--STAT)? No   4. What is your medication issue? Pt would like a c/b from the nurse he states this medication isn't working for him.

## 2023-11-15 NOTE — Telephone Encounter (Signed)
 He has been taking 300 mg of Irbesartan  ever since about 5 days after his appointment with Hao Meng, PA-C. He just forgot to call and get new prescription sent in.  OV Notes 09/22/23: START IRBESARTAN  150 MG DAILY; IF AFTER 2 WEEKS SYSTOLIC BLOOD PRESSURE IS PERSISTENTLY GREATER THAN 150 INCREASE TO 300 MG DAILY AND GIVE OFFICE A CALL SO NEW PRESCRIPTION CAN BE SENT TO PHARMACY  He states that his BP has been in the 160's/60-70's and hr average=60-65 in the morning a few hours after taking morning meds.  He added Losartan  100 mg at night (this was d/c'd 7/23)- due to bp at night being in 160-170's systolic. He states that after adding the Losartan , his bp has been a little lower in the morning= 150's/60-70's.  He has an upcoming appt with PA 11/23/23. But wondering if the provider wants to make any changes before his upcoming appt.  He is also wondering who his Current Cardiologist is since Dr Alveta retired.  Instructed him to keep a daily BP log, to bring to next appt. Also, that I will send this information to  PA and get back in touch with recommendations. He verbalized understanding.   Sent RX for Irbesartan  300 mg to pt's pharmacy.

## 2023-11-17 ENCOUNTER — Encounter: Payer: Self-pay | Admitting: Nurse Practitioner

## 2023-11-17 ENCOUNTER — Inpatient Hospital Stay: Admitting: Nurse Practitioner

## 2023-11-17 DIAGNOSIS — C7801 Secondary malignant neoplasm of right lung: Secondary | ICD-10-CM

## 2023-11-17 DIAGNOSIS — R530 Neoplastic (malignant) related fatigue: Secondary | ICD-10-CM

## 2023-11-17 DIAGNOSIS — G893 Neoplasm related pain (acute) (chronic): Secondary | ICD-10-CM | POA: Diagnosis not present

## 2023-11-17 DIAGNOSIS — Z515 Encounter for palliative care: Secondary | ICD-10-CM | POA: Diagnosis not present

## 2023-11-17 DIAGNOSIS — C642 Malignant neoplasm of left kidney, except renal pelvis: Secondary | ICD-10-CM | POA: Diagnosis not present

## 2023-11-17 DIAGNOSIS — C7802 Secondary malignant neoplasm of left lung: Secondary | ICD-10-CM

## 2023-11-17 DIAGNOSIS — C7951 Secondary malignant neoplasm of bone: Secondary | ICD-10-CM

## 2023-11-17 NOTE — Progress Notes (Signed)
 Palliative Medicine [facility]  [phone] [phone]   Name: Alexis Griffith Date: 11/17/2023 MRN: [id number]  DOB: 03-13-1940  Patient Care Team: Vernadine Charlie LELON, MD as PCP - General (Internal Medicine) Fernande Elspeth BROCKS, MD as PCP - Electrophysiology (Cardiology) Wonda Sharper, MD as PCP - Structural Heart (Cardiology) [place], Peter M, MD as PCP - Cardiology (Cardiology) Pickenpack-Cousar, Fannie SAILOR, NP as Nurse Practitioner [facility] and Palliative Medicine)   I connected with Aleene LELON Bare on 11/17/23 at  4:15 PM EDT by telephone and verified that I am speaking with the correct person using two identifiers.   I discussed the limitations, risks, security and privacy concerns of performing an evaluation and management service by telemedicine and the availability of in-person appointments. I also discussed with the patient that there may be a patient responsible charge related to this service. The patient expressed understanding and agreed to proceed.   Other persons participating in the visit and their role in the encounter: wife   Patient's location: [place]  Provider's location: [facility]   INTERVAL HISTORY: Alexis Griffith is a 83 y.o. 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  Palliative is seeing patient for symptom management and goals of care.   SOCIAL HISTORY:     reports that he quit smoking about 48 years ago. His smoking use included cigarettes. He has never used smokeless tobacco. He reports that he does not drink alcohol  and does not use drugs.  ADVANCE DIRECTIVES:  Patient has documents. Encouraged to bring in a copy. Wife is Clinical research associate.   CODE  STATUS: DNR  PAST MEDICAL HISTORY: Past Medical History:  Diagnosis Date   Anxiety    Bladder neck obstruction 06/05/2013   CAD (coronary artery disease)    Carotid artery disease (HCC)    CTA 08/2019 of the head and neck show moderate 65 to 75% bilateral ICA stenoses    Chronic diastolic CHF (congestive heart failure) (HCC)    Diabetes mellitus type 2 in nonobese (HCC)    Excessive urination at night 06/05/2013   Giant cell arteritis (HCC) 05/17/2012   Hyperlipidemia    Hypertension    Hypertensive urgency    Hypomagnesemia 09/11/2019   Mild anemia 09/11/2019   NSTEMI (non-ST elevated myocardial infarction) (HCC) 09/09/2019   Orthostatic hypotension 09/11/2019   Premature atrial contractions 09/12/2019   Pulmonary nodules    PVC (premature ventricular contraction)    Renal mass    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 Wonda & Dr. Lucas   Severe aortic stenosis    Sinus bradycardia on ECG    Sleep apnea    Squamous cell carcinoma of tongue [facility]) September 2012   Followed by Dr. Mable.   Stroke [facility])    TIA (transient ischemic attack)    Urinary retention 09/11/2019    ALLERGIES:  is allergic to macrodantin [nitrofurantoin], tape, sulfa antibiotics, bioflavonoid products, lisinopril, metformin  hcl, nutritional supplements, nutritional supplements, penicillins, pioglitazone, and sulfamethoxazole-trimethoprim.  MEDICATIONS:  Current Outpatient Medications  Medication Sig Dispense Refill   acetaminophen  (TYLENOL ) 325 MG tablet Take 650 mg by mouth every 6 (six) hours as needed for mild pain.  aspirin  81 MG chewable tablet Chew 1 tablet (81 mg total) by mouth daily. 360 tablet 0   carvedilol  (COREG ) 25 MG tablet Take 1 tablet (25 mg total) by mouth 2 (two) times daily. 180 tablet 3   Choline Fenofibrate  (FENOFIBRIC ACID) 135 MG CPDR Take 1 capsule by mouth daily.     clonazePAM  (KLONOPIN ) 0.5 MG  tablet Take 0.5 mg by mouth daily.     doxazosin  (CARDURA ) 4 MG tablet TAKE ONE TABLET BY MOUTH DAILY 90 tablet 3   FARXIGA 10 MG TABS tablet Take 10 mg by mouth daily.     glucose blood test strip 1 each by Other route as needed.      hydrALAZINE  (APRESOLINE ) 25 MG tablet Take 1 tablet (25 mg total) by mouth 3 (three) times daily. 90 tablet 3   insulin  glargine (LANTUS ) 100 UNIT/ML injection Inject 70 Units into the skin daily.     irbesartan  (AVAPRO ) 300 MG tablet Take 1 tablet (300 mg total) by mouth daily. 30 tablet 11   isosorbide  mononitrate (IMDUR ) 60 MG 24 hr tablet Take 1 tablet (60 mg total) by mouth daily. 90 tablet 2   Lancets (ONETOUCH DELICA PLUS LANCET33G) MISC 1 each by Other route daily.      magnesium  oxide (MAG-OX) 400 MG tablet Take 400 mg by mouth daily.     nitroGLYCERIN  (NITROSTAT ) 0.4 MG SL tablet PLACE 1 TABLET UNDER THE TONGUE EVERY 5 MINUTES AS NEEDED FOR CHEST PAIN UP TO 3 DOSES 25 tablet 9   ONE TOUCH ULTRA TEST test strip 1 each by Other route daily.      ranolazine  (RANEXA ) 500 MG 12 hr tablet Take 1 tablet (500 mg total) by mouth 2 (two) times daily. 180 tablet 3   rosuvastatin  (CRESTOR ) 10 MG tablet TAKE 1 TABLET by mouth ONCE DAILY. (Patient not taking: Reported on 09/23/2023) 90 tablet 2   senna-docusate (SENNA S) 8.6-50 MG tablet Take 2 tablets by mouth at bedtime as needed for mild constipation. 60 tablet 3   traMADol  (ULTRAM ) 50 MG tablet Take 1 tablet (50 mg total) by mouth every 8 (eight) hours as needed. 30 tablet 0   No current facility-administered medications for this visit.    VITAL SIGNS: There were no vitals taken for this visit. There were no vitals filed for this visit.  Estimated body mass index is 27.18 kg/m as calculated from the following:   Height as of 09/23/23: 5' 10 (1.778 m).   Weight as of 10/26/23: 189 lb 6.4 oz (85.9 kg).  PERFORMANCE STATUS (ECOG) : 1 - Symptomatic but completely ambulatory  IMPRESSION: Discussed the use of AI  scribe software for clinical note transcription with the patient, who gave verbal consent to proceed.  History of Present Illness NAFTULA DONAHUE is an 83 year old male who I connected with by phone for symptom management follow-up. His wife is present on call. Denies nausea, vomiting, constipation, or diarrhea. Occasional fatigue however tries to remain active.   He has experienced chronic pain for a long time, describing it as something he has had 'all my life'. States he has become accustomed to it over time.  He was prescribed tramadol  for pain management but has not taken it frequently. He confirms taking at least one dose, which did not significantly alleviate his pain, and mentions that he has not needed to take it regularly. Education provided on pain relief and ability to take Tramadol  1-2 tablets every 6 hours as needed.  He verbalized understanding.   Overall he is doing well. No uncontrolled symptoms. All questions answered and support provided.   Assessment & Plan Chronic pain syndrome Chronic pain syndrome persists but is not severe enough to necessitate regular tramadol  use. Tramadol  has been ineffective when used. He is accustomed to the pain and has multiple other health concerns. - Advise tramadol  as needed for severe pain, with the option to take one or two tablets if pain becomes intolerable.  I will plan to see patient back in 2-3 weeks. Sooner if needed.   Patient expressed understanding and was in agreement with this plan. He also understands that He can call the clinic at any time with any questions, concerns, or complaints.   Any controlled substances utilized were prescribed in the context of palliative care. PDMP has been reviewed.   Visit consisted of counseling and education dealing with the complex and emotionally intense issues of symptom management and palliative care in the setting of serious and potentially life-threatening illness.  Levon Borer,  AGPCNP-BC  Palliative Medicine Team/[facility]

## 2023-11-23 ENCOUNTER — Ambulatory Visit: Attending: Physician Assistant | Admitting: Physician Assistant

## 2023-11-23 ENCOUNTER — Encounter: Payer: Self-pay | Admitting: Physician Assistant

## 2023-11-23 VITALS — BP 152/50 | HR 74 | Ht 70.0 in | Wt 188.4 lb

## 2023-11-23 DIAGNOSIS — E785 Hyperlipidemia, unspecified: Secondary | ICD-10-CM

## 2023-11-23 DIAGNOSIS — Z952 Presence of prosthetic heart valve: Secondary | ICD-10-CM | POA: Diagnosis not present

## 2023-11-23 DIAGNOSIS — I10 Essential (primary) hypertension: Secondary | ICD-10-CM | POA: Diagnosis not present

## 2023-11-23 DIAGNOSIS — I25119 Atherosclerotic heart disease of native coronary artery with unspecified angina pectoris: Secondary | ICD-10-CM

## 2023-11-23 DIAGNOSIS — C642 Malignant neoplasm of left kidney, except renal pelvis: Secondary | ICD-10-CM

## 2023-11-23 MED ORDER — ISOSORBIDE MONONITRATE ER 120 MG PO TB24: 120.0000 mg | ORAL_TABLET | Freq: Every day | ORAL | 3 refills | Status: DC

## 2023-11-23 NOTE — Progress Notes (Unsigned)
 Cardiology Office Note   Date:  11/25/2023  ID:  Roemello, Speyer 15-Oct-1940, MRN [id number] PCP: Vernadine Charlie LELON, MD  [facility] Providers Cardiologist:  Peter Swaziland, MD Electrophysiologist:  Elspeth Sage, MD  Structural Heart:  Ozell Fell, MD    History of Present Illness MARISOL GLAZER is a 83 y.o. 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  Coronary CTA in 2021 revealed three-vessel CAD.  Subsequent cardiac catheterization performed on 07/07/2019 showed 30% ostial left main, 70% proximal to mid LAD, 70% D2, 40% mid to distal LAD, 75% proximal to mid left circumflex lesion, 100% occluded ostial to distal RCA lesion with significant left-to-right collaterals filling the PDA and PL system, 50% RPDA lesion, severe aortic stenosis.  CT surgery was consulted to consider options between two-vessel PCI and TAVR versus CABG/TAVR.  Ultimately it was decided to proceed with TAVR and treat coronary artery disease medically since patient denied any chest discomfort.  He eventually underwent TAVR procedure in June 2021, postprocedure, he had difficulty mobilizing and felt unsteady.  MRI of the brain showed a 4 mm acute infarct of the left medial frontal cortex which ultimately felt to be an incidental finding.  He also had subsequent syncope, all of his antihypertensives were held.  Systolic blood pressure was dropping from 130 down to 95 upon standing.  Plavix  was later discontinued in the setting of gross hematuria.  Last echocardiogram obtained on 08/02/2020 showed EF of 60 to 65%, normal RV, trivial MR, stable TAVR valve in the aortic position, no significant paravalvular leakage.    He had a ER visit in January 2022 was near syncope thought to be related to anxiety and orthostatic hypotension.  Heart monitor in 2024 was normal with  single episode of NSVT 17 beats.  Duplex ultrasound in July 2024 showed moderate disease in the right ICA, mild disease on the left side.  Patient was last seen by Dr. Alveta in August 2024 at which time he was doing well.  Patient was seen by Thom Sluder on 08/18/2023 after complaining of 3 to 4 weeks of intermittent chest discomfort requiring nitroglycerin .  Blood pressure was in the 190s.  He was not felt to be a great cardiac catheterization candidate, however on further discussion with Dr. Swaziland, repeat cardiac catheterization with PCI is possible.  His blood pressure was very high.  Lopressor  was switched to carvedilol  25 mg twice a day.  Echocardiogram was also ordered.  He was recently set up with Dr. Tina of oncology service.  CT imaging in May showed a large left kidney mass measuring 12.4 x 11 cm.  This mass was previously measuring 10.7 x 8.7 cm in May 2024.  There are other lung nodules that also gradually increase in size over the years.  MRI of the brain showed a 14 mm enhancing lesion in the parietal calvarium.  He has been seen by radiation oncology service Dr. Norleen Limes on 09/23/2023, it appears that the decision is to hold off on treatment and plan to repeat MRI in 3 months to reassess the enhancing lesion in the perianal calvarium.  I last saw the patient on 09/22/2023 blood pressure was very high at the time.  I switched his losartan  100 mg daily to irbesartan  150 mg daily.  I instructed the patient to increase irbesartan  to 300 mg daily  after 2 weeks if his systolic blood pressure remain elevated. Since the last visit, patient had repeat echocardiogram on 8//2025 which showed EF 60 to 65%, no regional wall motion abnormality, normal RV, mild MR, normal functioning aortic valve prosthesis.  Carotid ultrasound showed moderate right ICA disease, mild left ICA disease.   Patient presents today for follow-up.  Blood pressure continued to be elevated.  He still has occasional chest discomfort  primarily at night but does not happen when he exert himself.  With his metastatic cancer status, he is not a great candidate for any invasive study.  I gave him the ED precautions in case his chest pain worsens.  I will increase his Imdur  to 120 mg daily for antianginal purpose.  Otherwise, he is still waiting to decide on whether or not to proceed with radiation oncology therapy depend on the upcoming MRI.  He can follow-up with Dr. Swaziland in 3 to 4 months.  ROS:   Patient complains of occasional chest discomfort primarily happens at night, he denies any significant shortness of breath, lower extremity edema, orthopnea or PND.  Studies Reviewed      Cardiac Studies & Procedures   ______________________________________________________________________________________________ CARDIAC CATHETERIZATION  CARDIAC CATHETERIZATION 07/07/2019  Conclusion  Ost LM lesion is 30% stenosed.  Prox LAD to Mid LAD lesion is 70% stenosed with 70% stenosed side branch in 2nd Diag. Both vessels positive by CT FFR  Mid LAD to Dist LAD lesion is 40% stenosed. Dist LAD lesion is 75% stenosed.  Prox Cx lesion is 45% stenosed. Prox Cx to Mid Cx lesion is 75% stenosed. RFR positive is 0.87  Mid Cx lesion is 5% stenosed with 50% stenosed side branch in 3rd Mrg. No significant drop in RFR reading beyond this lesion.  Ost RCA to Dist RCA lesion is 100% stenosed. Significant left to right collaterals fill the PDA and PL system.  RPDA lesion is 50% stenosed.  --------------------------------------  The left ventricular systolic function is normal. The left ventricular ejection fraction is 55-65% by visual estimate. Regional wall motion knowledged and inferior wall.  LV end diastolic pressure is moderately elevated.  There is ~SEVERE AORTIC VALVE STENOSIS.   SUMMARY  Severe three-vessel disease: 100% CTO of ostial RCA (left to right collaterals fill PDA and PL), long 70% calcified proximal to mid LAD  (positive by CT FFR), segmental ~75% proximal-mid LCx (positive by RFR)  Borderline SEVERE AORTIC STENOSIS-mean gradient estimated 37 mmHg by pullback.  Normal LVEF with basal inferior hypokinesis seen on echocardiogram and LV gram.   Plan will be to consult CVTS/Cardiology Valve Clinic to consider options between two-vessel PCI and TAVR versus CABG/AVR. He is not having resting chest pain, will discharge home today on Imdur  pending Valve Clinic evaluation.SABRA [name], MD  Findings Coronary Findings Diagnostic  Dominance: Right  Left Main Vessel was injected. Vessel is large. Very short LEFT MAIN Ost LM lesion is 30% stenosed. The lesion is eccentric. The lesion is calcified.  Left Anterior Descending Prox LAD to Mid LAD lesion is 70% stenosed with 70% stenosed side branch in 2nd Diag. The lesion is located at the bend, segmental and eccentric. The lesion is moderately calcified. This segment was considered significant by CT FFR (0.76) and is angiographically significant. Mid LAD to Dist LAD lesion is 40% stenosed. Dist LAD lesion is 75% stenosed. The lesion is focal. Apical  First Diagonal Branch Vessel is small in size.  First Septal Branch Vessel is small in size.  Second Diagonal Branch Vessel is moderate in size.  Second Septal Branch Vessel is small in size.  Third Septal Branch Vessel is small in size.  Left Circumflex Vessel is large. The distal circumflex courses is a 2nd Mrg Prox Cx lesion is 45% stenosed. The lesion is focal and eccentric. The lesion is mildly calcified. Prox Cx to Mid Cx lesion is 75% stenosed. The lesion is located proximal to the major branch, segmental, eccentric and irregular. Pressure wire/FFR was performed on the lesion. RFR as follows: OM3 =0.84, Ostial OM3 = 0.86, pre OM3 = 0.87; pre OM1 = 0.93 --> this indicates that the lesion becomes significant in the segment and does not require the ostial OM 3 lesion. Mid Cx lesion is 5%  stenosed with 50% stenosed side branch in 3rd Mrg. The lesion is located at the bifurcation and focal. Just after lesion RFR 0.86 & mid branch 0.84  First Obtuse Marginal Branch Vessel is small in size.  Third Obtuse Marginal Branch Vessel is moderate in size.  Left Posterior Atrioventricular Artery Vessel is small in size.  Right Coronary Artery Ost RCA to Dist RCA lesion is 100% stenosed. The lesion is chronically occluded with left-to-left collateral flow.  Right Posterior Descending Artery The vessel exhibits minimal luminal irregularities. Collaterals RPDA filled by collaterals from 2nd Sept.  Collaterals RPDA filled by collaterals from 3rd Sept.  RPDA lesion is 50% stenosed. The lesion is distal to major branch and focal.  Right Posterior Atrioventricular Artery Vessel is moderate in size. The vessel exhibits minimal luminal irregularities.  First Right Posterolateral Branch Collaterals 1st RPL filled by collaterals from Dist Cx.  Second Right Posterolateral Branch Vessel is small in size. Collaterals 2nd RPL filled by collaterals from 3rd Mrg.  Intervention  No interventions have been documented.     ECHOCARDIOGRAM  ECHOCARDIOGRAM COMPLETE 10/04/2023  Narrative ECHOCARDIOGRAM REPORT    Patient Name:   JERMANIE MINSHALL Date of Exam: 10/04/2023 Medical Rec #:  [id number]      Height:       70.0 in Accession #:    [id number]     Weight:       191.1 lb Date of Birth:  1940-11-24      BSA:          2.047 m Patient Age:    82 years       BP:           179/79 mmHg Patient Gender: M              HR:           71 bpm. Exam Location:  Outpatient  Procedure: 2D Echo, Cardiac Doppler, Color Doppler and Strain Analysis (Both Spectral and Color Flow Doppler were utilized during procedure).  Indications:    S/P TAVR Z95.2  History:        Patient has prior history of Echocardiogram examinations. NSTEMI and CAD, TIA and Stroke, Aortic Valve Disease,  Arrythmias:Sinus Nola and PVC; Risk Factors:Hypertension, Diabetes and Hyperlipidemia. S/P Edwards Sapien TAVR, placed 08/15/2019.  Sonographer:    Sharlet Hamilton RDCS Referring Phys: [id number] SHENG L HALEY  IMPRESSIONS   1. Left ventricular ejection fraction, by estimation, is 60 to 65%. The left ventricle has normal function. The left ventricle has no regional wall motion abnormalities. There is mild concentric left ventricular hypertrophy. Left ventricular diastolic parameters are consistent with Grade I diastolic dysfunction (impaired relaxation). The average left ventricular global longitudinal strain is -14.8 %. 2. Right  ventricular systolic function is normal. The right ventricular size is normal. Tricuspid regurgitation signal is inadequate for assessing PA pressure. 3. Left atrial size was moderately dilated. 4. The mitral valve is degenerative. Mild mitral valve regurgitation. No evidence of mitral stenosis. 5. The aortic valve has been repaired/replaced. Aortic valve regurgitation is not visualized. No aortic stenosis is present. Echo findings are consistent with normal structure and function of the aortic valve prosthesis. Aortic valve area, by VTI measures 1.41 cm. Aortic valve mean gradient measures 10.8 mmHg. Aortic valve Vmax measures 2.28 m/s. 6. The inferior vena cava is dilated in size with >50% respiratory variability, suggesting right atrial pressure of 8 mmHg.  FINDINGS Left Ventricle: Left ventricular ejection fraction, by estimation, is 60 to 65%. The left ventricle has normal function. The left ventricle has no regional wall motion abnormalities. The average left ventricular global longitudinal strain is -14.8 %. Strain was performed and the global longitudinal strain is indeterminate. The left ventricular internal cavity size was normal in size. There is mild concentric left ventricular hypertrophy. Left ventricular diastolic parameters are consistent with Grade I  diastolic dysfunction (impaired relaxation).  Right Ventricle: The right ventricular size is normal. No increase in right ventricular wall thickness. Right ventricular systolic function is normal. Tricuspid regurgitation signal is inadequate for assessing PA pressure.  Left Atrium: Left atrial size was moderately dilated.  Right Atrium: Right atrial size was normal in size.  Pericardium: There is no evidence of pericardial effusion.  Mitral Valve: The mitral valve is degenerative in appearance. Mild mitral valve regurgitation. No evidence of mitral valve stenosis.  Tricuspid Valve: The tricuspid valve is normal in structure. Tricuspid valve regurgitation is trivial. No evidence of tricuspid stenosis.  Aortic Valve: The aortic valve has been repaired/replaced. Aortic valve regurgitation is not visualized. No aortic stenosis is present. Aortic valve mean gradient measures 10.8 mmHg. Aortic valve peak gradient measures 20.8 mmHg. Aortic valve area, by VTI measures 1.41 cm. Echo findings are consistent with normal structure and function of the aortic valve prosthesis.  Pulmonic Valve: The pulmonic valve was normal in structure. Pulmonic valve regurgitation is trivial. No evidence of pulmonic stenosis.  Aorta: The aortic root is normal in size and structure.  Venous: The inferior vena cava is dilated in size with greater than 50% respiratory variability, suggesting right atrial pressure of 8 mmHg.  IAS/Shunts: No atrial level shunt detected by color flow Doppler.   LEFT VENTRICLE PLAX 2D LVIDd:         4.62 cm   Diastology LVIDs:         3.03 cm   LV e' medial:    6.09 cm/s LV PW:         1.23 cm   LV E/e' medial:  14.1 LV IVS:        1.21 cm   LV e' lateral:   8.59 cm/s LVOT diam:     2.10 cm   LV E/e' lateral: 10.0 LV SV:         72 LV SV Index:   35        2D Longitudinal Strain LVOT Area:     3.46 cm  2D Strain GLS (A4C):   -15.0 % 2D Strain GLS (A3C):   -13.2 % 2D Strain GLS  (A2C):   -16.0 % 2D Strain GLS Avg:     -14.8 %  RIGHT VENTRICLE             IVC RV Basal diam:  3.28 cm     IVC diam: 2.18 cm RV Mid diam:    2.44 cm RV S prime:     15.30 cm/s TAPSE (M-mode): 2.9 cm  LEFT ATRIUM             Index        RIGHT ATRIUM           Index LA diam:        3.80 cm 1.86 cm/m   RA Pressure: 8.00 mmHg LA Vol (A2C):   49.3 ml 24.08 ml/m  RA Area:     14.40 cm LA Vol (A4C):   62.0 ml 30.28 ml/m  RA Volume:   34.00 ml  16.61 ml/m LA Biplane Vol: 59.5 ml 29.06 ml/m AORTIC VALVE AV Area (Vmax):    1.41 cm AV Area (Vmean):   1.46 cm AV Area (VTI):     1.41 cm AV Vmax:           228.00 cm/s AV Vmean:          151.500 cm/s AV VTI:            0.510 m AV Peak Grad:      20.8 mmHg AV Mean Grad:      10.8 mmHg LVOT Vmax:         92.80 cm/s LVOT Vmean:        63.900 cm/s LVOT VTI:          0.208 m LVOT/AV VTI ratio: 0.41  AORTA Ao Root diam: 2.90 cm Ao Asc diam:  3.30 cm  MV E velocity: 85.90 cm/s   TRICUSPID VALVE MV A velocity: 118.00 cm/s  Estimated RAP:  8.00 mmHg MV E/A ratio:  0.73 SHUNTS Systemic VTI:  0.21 m Systemic Diam: 2.10 cm  Toribio Fuel MD Electronically signed by Toribio Fuel MD Signature Date/Time: 10/04/2023/11:15:46 PM    Final    MONITORS  LONG TERM MONITOR (3-14 DAYS) 04/21/2022  Narrative   Predominate rhythm is sinus rhythm.   1 run of nonsustained supraventricular tachycardia lasting 17 beats with max   Rare PVCs, PACs , rare couplets, bigeminy and  rare triplets   No serious arrhythmias observed   Patch Wear Time:  3 days and 4 hours (2024-02-07T14:21:27-0500 to 2024-02-10T18:46:03-498)  Patient had a min HR of 46 bpm, max HR of 141 bpm, and avg HR of 68 bpm. Predominant underlying rhythm was Sinus Rhythm. First Degree AV Block was present. 1 run of Supraventricular Tachycardia occurred lasting 17 beats with a max rate of 141 bpm (avg 108 bpm). Isolated SVEs were rare (<1.0%), SVE Couplets were rare  (<1.0%), and SVE Triplets were rare (<1.0%). Isolated VEs were occasional (1.8%, 4952), VE Couplets were rare (<1.0%, 33), and no VE Triplets were present. Ventricular Bigeminy and Trigeminy were present.   CT SCANS  CT CORONARY MORPH W/CTA COR W/SCORE 07/14/2019  Addendum 07/14/2019  1:22 PM ADDENDUM REPORT: 07/14/2019 13:19  CLINICAL DATA:  Aortic stenosis  EXAM: Cardiac TAVR CT  TECHNIQUE: The patient was scanned on a Siemens Force 192 slice scanner. A 120 kV retrospective scan was triggered in the descending thoracic aorta at 111 HU's. Gantry rotation speed was 270 msecs and collimation was .9 mm. No beta blockade or nitro were given. The 3D data set was reconstructed in 5% intervals of the R-R cycle. Systolic and diastolic phases were analyzed on a dedicated work station using MPR, MIP and VRT modes. The patient received 80 cc of contrast.  FINDINGS: Aortic Valve: Calcified tri leaflet with restricted leaflet motion  Aorta: Normal arch vessels no aneurysm no coarctation  Sinotubular Junction: 24 mm heavily calcified  Ascending Thoracic Aorta: 29 mm  Aortic Arch: 26 mm  Descending Thoracic Aorta: 23 mm  Sinus of Valsalva Measurements:  Non-coronary: 31 mm  Right - coronary: 28 mm  Left - coronary: 29.2 mm  Coronary Artery Height above Annulus:  Left Main: 13.4 mm above annulus  Right Coronary: 14.7 mm above annulus but occluded  Virtual Basal Annulus Measurements:  Maximum/Minimum Diameter: 19.5 mm x 28.9 mm  Perimeter: 81 mm  Area: 512 mm2  Coronary Arteries: Sufficient height above annulus for deployment RCA occluded fills by collaterals from left system  Optimum Fluoroscopic Angle for Delivery: LAO 3 Caudal 3 degrees  IMPRESSION: 1. Calcified tri leaflet AV with annular area of 512 mm 2 suitable for a 26 mm Sapien 3 valve  2. Coronary arteries sufficient height above annulus for deployment Native RCA occluded  3.  Normal aortic root 2.9 cm  with no aneurysm  4.  Optimum angiographic angle for deployment LAO 3 Caudal 3 degrees  5.  Significant calcification of the STJ  Maude Emmer   Electronically Signed By: Maude Emmer M.D. On: 07/14/2019 13:19  Narrative EXAM: OVER-READ INTERPRETATION  CT CHEST  The following report is an over-read performed by radiologist Dr. Toribio Aye of [facility] on 07/14/2019. This over-read does not include interpretation of cardiac or coronary anatomy or pathology. The coronary calcium  score/coronary CTA interpretation by the cardiologist is attached.  COMPARISON:  Cardiac CTA 06/28/2019.  FINDINGS: Extracardiac findings will be described separately on dictation for contemporaneously obtained CTA chest, abdomen and pelvis.  IMPRESSION: Please see separate dictation for contemporaneously obtained CTA chest, abdomen and pelvis dated 07/14/2019 for full description of relevant extracardiac findings.  Electronically Signed: By: Toribio Aye M.D. On: 07/14/2019 12:03   CT SCANS  CT CORONARY FRACTIONAL FLOW RESERVE DATA PREP 06/28/2019  Narrative EXAM: FFRCT ANALYSIS: CT scan in 83 year old male with severe calcific plaque.  FINDINGS: FFRct analysis was performed on the original cardiac CT angiogram dataset. Diagrammatic representation of the FFRct analysis is provided in a separate PDF document in PACS. This dictation was created using the PDF document and an interactive 3D model of the results. 3D model is not available in the EMR/PACS. Normal FFR range is >0.80.  1. Left Main: 0.90  2. LAD: Proximal 0.90, mid 0.78, distal 0.68 - ABNORMAL  3. LCX: Proximal 0.87, mid 0.71, distal 0.57 - ABNORMAL  4. Ramus: n/a  5. RCA: Proximal 1.0, Mid OCCLUDED  - ABNORMAL  IMPRESSION: Severe three vessel coronary artery disease that is flow limiting in the mid LAD, mid Circumflex and totally occluded in mid RCA.  Recommend cardiac  catheterization.  Oneil Parchment, MD [facility]  Note: These examples are not recommendations of [employer] and only provided as examples of what other customers are doing.   Electronically Signed By: Oneil Parchment MD On: 06/28/2019 18:18   CT CORONARY MORPH W/CTA COR W/SCORE 06/28/2019  Addendum 06/28/2019  2:35 PM ADDENDUM REPORT: 06/28/2019 14:32  EXAM: OVER-READ INTERPRETATION  CT CHEST  The following report is an over-read performed by radiologist Dr. Selinda Saas [facility] on 06/28/2019. This over-read does not include interpretation of cardiac or coronary anatomy or pathology. The coronary CTA interpretation by the cardiologist is attached.  COMPARISON:  11/11/2010 chest radiograph.  FINDINGS: Cardiovascular: Normal heart size. No significant  pericardial effusion/thickening. Atherosclerotic nonaneurysmal thoracic aorta. Normal caliber pulmonary arteries. No central pulmonary emboli.  Mediastinum/Nodes: Unremarkable esophagus. No pathologically enlarged mediastinal or hilar lymph nodes.  Lungs/Pleura: No pneumothorax. No pleural effusion. Calcified bilateral pulmonary granulomas, largest 1.3 cm in the medial right lower lobe. Solid 4 mm pulmonary nodule in the superior segment left lower lobe (series 13/image 1). No acute consolidative airspace disease, lung masses or additional significant pulmonary nodules.  Upper abdomen: No acute abnormality.  Musculoskeletal: No aggressive appearing focal osseous lesions. Mild thoracic spondylosis.  IMPRESSION: 1. Solitary 4 mm solid pulmonary nodule in the superior segment left lower lobe. No follow-up needed if patient is low-risk. Non-contrast chest CT can be considered in 12 months if patient is high-risk. This recommendation follows the consensus statement: Guidelines for Management of Incidental Pulmonary Nodules Detected on CT Images: From the Fleischner Society 2017; Radiology 2017; 284:228-243. 2.  Aortic  Atherosclerosis (ICD10-I70.0).   Electronically Signed By: Selinda DELENA Blue M.D. On: 06/28/2019 14:32  Narrative CLINICAL DATA:  83 year old with chest pain  EXAM: Cardiac/Coronary  CTA  TECHNIQUE: The patient was scanned on a [employer].  FINDINGS: A 110 kV prospective scan was triggered in the descending thoracic aorta at 111 HU's. Axial non-contrast 3 mm slices were carried out through the heart. The data set was analyzed on a dedicated work station and scored using the Agatson method. Gantry rotation speed was 250 msecs and collimation was .6 mm. Beta blockade and 0.8 mg of sl NTG was given. The 3D data set was reconstructed in 5% intervals of the 67-82 % of the R-R cycle. Diastolic phases were analyzed on a dedicated work station using MPR, MIP and VRT modes. The patient received 80 cc of contrast.  Aorta:  Normal size.  Scattered calcifications.  No dissection.  Aortic Valve:  Trileaflet.  Moderate aortic valve calcifications.  Coronary Arteries:  Normal coronary origin.  Right dominance.  RCA is a large dominant artery that gives rise to PDA and PLA. There is severe calcified plaque, proximal predominance. Possible severe luminal disease (75-99%). Sending for FFR analysis.  Left main is a large artery that gives rise to LAD and LCX arteries. There is calcified plaque (appears non flow limiting 0-24%)  LAD is a large vessel that has severe diffuse calcified plaque. Possible severe stenosis proximally 75-99%. Sending for FFR. Possible severe distal calcified lesion 75-99% distally.  LCX is a non-dominant artery that gives rise to one large OM1 branch. There is severe calcified plaque that may be flow limiting 50-74% in the proximal region. Sending for FFR.  Other findings:  Normal pulmonary vein drainage into the left atrium.  Normal left atrial appendage without a thrombus.  Normal size of the pulmonary artery.  IMPRESSION: 1. Coronary calcium   score of 2633. This was 17 percentile for age and sex matched control.  2. Normal coronary origin with right dominance.  3.  Aortic atherosclerosis.  Moderate aortic valve sclerosis.  4. Severe multivessel calcified plaque, which may be flow limiting. Sending for FFR analysis.  Oneil Parchment, MD [facility]  Electronically Signed: By: Oneil Parchment MD On: 06/28/2019 13:48     ______________________________________________________________________________________________      Risk Assessment/Calculations          Physical Exam VS:  BP (!) 152/50   Pulse 74   Ht 5' 10 (1.778 m)   Wt 188 lb 6.4 oz (85.5 kg)   SpO2 93%   BMI 27.03 kg/m  Wt Readings from Last 3 Encounters:  11/23/23 188 lb 6.4 oz (85.5 kg)  10/26/23 189 lb 6.4 oz (85.9 kg)  09/23/23 191 lb 2 oz (86.7 kg)    GEN: Well nourished, well developed in no acute distress NECK: No JVD; No carotid bruits CARDIAC: RRR, no murmurs, rubs, gallops RESPIRATORY:  Clear to auscultation without rales, wheezing or rhonchi  ABDOMEN: Soft, non-tender, non-distended EXTREMITIES:  No edema; No deformity   ASSESSMENT AND PLAN  CAD: Last cardiac catheterization performed in May 2021 showed multivessel disease with severe aortic stenosis.  He eventually underwent TAVR procedure, underlying coronary artery disease was treated medically given lack of symptom.  He has been having intermittent chest pain recently.  However given metastatic cancer status, he is not a great candidate for invasive study unless symptom is interfering with his lifestyle.  His chest discomfort is also atypical happening primarily at night but does not occurs with exertion.  I recommend he increase Imdur  to 120 mg daily for antianginal purpose.  History of TAVR: Stable on last echocardiogram on 8//2025  Hypertension: Blood pressure stable  Hyperlipidemia: On rosuvastatin   Renal cell carcinoma: Recent MRI of the brain also showed a 14 mm enhancing lesion  in the parietal calvarium.  He has been seen by radiation oncology service and they decided to hold off on radiation therapy at this time.  He plan to repeat MRI of the head then decide on the course of treatment at the later time.       Dispo: Follow-up with Dr. Swaziland in 3 to 77-month  Signed, Neco Kling, [place]

## 2023-11-23 NOTE — Patient Instructions (Signed)
 Medication Instructions:  Increase Imdur  120 mg take one tablet daily *If you need a refill on your cardiac medications before your next appointment, please call your pharmacy*   Follow-Up: At [facility], you and your health needs are our priority.  As part of our continuing mission to provide you with exceptional heart care, our providers are all part of one team.  This team includes your primary Cardiologist (physician) and Advanced Practice Providers or APPs (Physician Assistants and Nurse Practitioners) who all work together to provide you with the care you need, when you need it.  Your next appointment:   3-4 month(s)  Provider:   Peter Swaziland, MD

## 2023-11-30 ENCOUNTER — Other Ambulatory Visit: Payer: Self-pay | Admitting: Nurse Practitioner

## 2023-11-30 DIAGNOSIS — C7951 Secondary malignant neoplasm of bone: Secondary | ICD-10-CM

## 2023-11-30 DIAGNOSIS — G893 Neoplasm related pain (acute) (chronic): Secondary | ICD-10-CM

## 2023-11-30 DIAGNOSIS — C642 Malignant neoplasm of left kidney, except renal pelvis: Secondary | ICD-10-CM

## 2023-11-30 DIAGNOSIS — Z515 Encounter for palliative care: Secondary | ICD-10-CM

## 2023-11-30 DIAGNOSIS — C7801 Secondary malignant neoplasm of right lung: Secondary | ICD-10-CM

## 2023-12-20 ENCOUNTER — Other Ambulatory Visit: Payer: Self-pay

## 2023-12-23 MED ORDER — ISOSORBIDE MONONITRATE ER 120 MG PO TB24: 120.0000 mg | ORAL_TABLET | Freq: Every day | ORAL | 3 refills | Status: AC

## 2023-12-25 ENCOUNTER — Encounter (HOSPITAL_COMMUNITY): Payer: Self-pay

## 2023-12-27 ENCOUNTER — Ambulatory Visit (HOSPITAL_COMMUNITY)
Admission: RE | Admit: 2023-12-27 | Discharge: 2023-12-27 | Disposition: A | Discharge status: Home / Self Care | Source: Ambulatory Visit

## 2023-12-27 ENCOUNTER — Encounter (HOSPITAL_COMMUNITY): Payer: Self-pay

## 2023-12-27 DIAGNOSIS — C642 Malignant neoplasm of left kidney, except renal pelvis: Secondary | ICD-10-CM | POA: Diagnosis present

## 2023-12-27 DIAGNOSIS — C7951 Secondary malignant neoplasm of bone: Secondary | ICD-10-CM | POA: Diagnosis present

## 2023-12-27 DIAGNOSIS — C7801 Secondary malignant neoplasm of right lung: Secondary | ICD-10-CM | POA: Diagnosis present

## 2023-12-27 DIAGNOSIS — C7802 Secondary malignant neoplasm of left lung: Secondary | ICD-10-CM | POA: Insufficient documentation

## 2023-12-27 DIAGNOSIS — E119 Type 2 diabetes mellitus without complications: Secondary | ICD-10-CM | POA: Diagnosis present

## 2023-12-27 LAB — POCT I-STAT CREATININE: Creatinine, Ser: 1.8 mg/dL — ABNORMAL HIGH (ref 0.61–1.24)

## 2023-12-27 MED ORDER — IOHEXOL 300 MG/ML  SOLN
75.0000 mL | Freq: Once | INTRAMUSCULAR | Status: AC | PRN
  Administered 2023-12-27: 75 mL via INTRAVENOUS

## 2023-12-27 MED ORDER — IOHEXOL 300 MG/ML  SOLN: 100.0000 mL | Freq: Once | INTRAMUSCULAR | Status: DC | PRN

## 2023-12-27 MED ORDER — SODIUM CHLORIDE (PF) 0.9 % IJ SOLN
INTRAMUSCULAR | Status: AC
  Filled 2023-12-27: qty 50

## 2023-12-28 ENCOUNTER — Ambulatory Visit (HOSPITAL_COMMUNITY)
Admission: RE | Admit: 2023-12-28 | Discharge: 2023-12-28 | Disposition: A | Discharge status: Home / Self Care | Source: Ambulatory Visit | Attending: Radiation Oncology | Admitting: Radiation Oncology

## 2023-12-28 DIAGNOSIS — C7951 Secondary malignant neoplasm of bone: Secondary | ICD-10-CM | POA: Insufficient documentation

## 2023-12-28 DIAGNOSIS — C7801 Secondary malignant neoplasm of right lung: Secondary | ICD-10-CM | POA: Insufficient documentation

## 2023-12-28 DIAGNOSIS — C7802 Secondary malignant neoplasm of left lung: Secondary | ICD-10-CM | POA: Insufficient documentation

## 2023-12-28 MED ORDER — GADOBUTROL 1 MMOL/ML IV SOLN
8.0000 mL | Freq: Once | INTRAVENOUS | Status: AC | PRN
Start: 2023-12-28 — End: 2023-12-28
  Administered 2023-12-28: 8 mL via INTRAVENOUS

## 2023-12-29 NOTE — Progress Notes (Unsigned)
 [facility] OFFICE PROGRESS NOTE  Patient Care Team: Griffith, Alexis ORN, MD as PCP - General (Internal Medicine) Fernande Alexis BROCKS, MD (Inactive) as PCP - Electrophysiology (Cardiology) Wonda Sharper, MD as PCP - Structural Heart (Cardiology) Griffith, Alexis M, MD as PCP - Cardiology (Cardiology) Alexis Griffith, Alexis SAILOR, NP as Nurse Practitioner [facility] and Palliative Medicine)  Alexis Griffith is a 83 y.o.male with history of CAD, aortic stenosis status post TAVR 08/2019, giant cell arteritis 2013, moderate bilateral carotid artery disease, PVCs, hypertension, hyperlipidemia, 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  Most recent CT was in July 2025.  Left-sided kidney mass measuring 12.4 x 11 cm, previously 10.7 x 8.7 cm in May of 2024.  Other lung nodules are gradually increased in size over the past few years since 2022.   IMDC risk: Prognostic Risk Groups: Intermediate-risk group. Performance status <80% (Karnofsky)    CT scan showed bilateral pulmonary metastases.  Mostly less than 2 cm.  Mostly increased by few millimeters in size.  Largest at medial right lower lobe measuring 5.8 x 4.4 cm, previously 5.7 x 4.1 cm.  Large left kidney mass reported stable.  We discussed results today.  She does not have much changes in clinical status.  No new symptoms.   We discussed treatment with immunotherapy and the ipilimumab and nivolumab.  We discussed potential side effects.  Drug information printed out for him today.  We went over data from [facility].  Long-term survival has been reported. Given he is asymptomatic, currently having fair quality of life, with multiple comorbidities, and patient concerns, elect to continue to monitor.  Discussed goals of care again.  We discussed palliative treatment, versus hospice care.   He is considering treatment when he gets to that point.  He is not ready for hospice.  He has fairly indolent course of RCC. TKI will worsening his uncontrolled hypertension. Immunotherapy can be considered.  We discussed potential side effects.  Alternatively, transition to hospice upon disease progression, symptomatic from malignancy.  Ongoing discussion in future visits. 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 Consider biopsy in the future if progression and ready for treatment Discussed earlier follow-up if new concerning symptoms such as progressive worsening fatigue, loss of appetite, edema, abdominal pain and distention, hematuria Nausea without vomiting Zofran  sent to pharmacy today Microcytic anemia Continue follow-up CBC and ferritin B12 deficiency Continue B12 1000 mcg daily. Repeat next visit.  I personally spent a total of 60 minutes in the care of the patient today including preparing to see the patient, getting/reviewing separately obtained history, performing a medically appropriate exam/evaluation, counseling and educating, placing orders, documenting clinical information in the EHR, communicating results, coordinating care, and discussed goals of care.  Orders Placed This Encounter  Procedures   CT CHEST ABDOMEN PELVIS W CONTRAST    Standing Status:   Future    Expected Date:   05/01/2024    Expiration Date:   12/29/2024    If indicated for the ordered procedure, I authorize the administration of contrast media per Radiology protocol:   Yes    Does the patient have a contrast media/X-ray dye allergy?:   No    Preferred imaging location?:   [facility]  If indicated for the ordered procedure, I authorize the administration of oral contrast media per Radiology protocol:   Yes   MR Brain W Wo Contrast    Standing Status:   Future    Expected Date:   05/01/2024    Expiration Date:    12/29/2024    If indicated for the ordered procedure, I authorize the administration of contrast media per Radiology protocol:   Yes    What is the patient's sedation requirement?:   No Sedation    Does the patient have a pacemaker or implanted devices?:   No    Use SRS Protocol?:   Yes    Preferred imaging location?:   [facility] (table limit - 500lbs)   CBC with Differential (Cancer Center Only)    Standing Status:   Future    Expiration Date:   12/29/2024   CMP (Cancer Center only)    Standing Status:   Future    Expiration Date:   12/29/2024   Lactate dehydrogenase    Standing Status:   Future    Expiration Date:   12/29/2024   Ferritin    Standing Status:   Future    Expiration Date:   12/29/2024   Urinalysis, Complete w Microscopic    Standing Status:   Future    Expiration Date:   12/29/2024   Vitamin B12    Standing Status:   Future    Expiration Date:   12/29/2024   Folate    Standing Status:   Future    Expiration Date:   12/29/2024     [name], MD  INTERVAL HISTORY: Patient returns for follow-up.  Overall no significant clinical changes.  No new headaches.  No new neurologic symptoms.  No coughing or shortness of breath.  Chronic fatigue is about the same.  He is attribute to his valvular issue.  He denies any persistent, or worsening abdominal pain.  Intermittent left sided pain at times  Oncology history First visit with me on 08/31/2023.  Clinically, multiple medical, and cardiovascular comorbidities.  Recently, he has uncontrolled hypertension.  He coughs more in the morning from drainage. He denies worsening in the last year. Probably related to allergy. He denies more short of breath than past. Report his oxygen in the morning are 93-98% in the past few years. He denies short of breath easily though he is not getting enough exercises. He reports not feeling like due to low energy. This is since heart surgery for valve replacement and not  worsening in the last year.   He denies more abdominal pain. No nausea, vomiting or diarrhea.   No bloody urine. No trouble urinating.   Report some headaches he attributes to the sinus in the temporal area. No focal weakness. History of TIA many years ago.    He has been following with Dr. Patrcia at urology for annual CT scan surveillance for intermittent RCC.  From CT report 04/2020  RLL 1.8 cm  nodule. 05/2021. Progressive bilateral plum metastases, RLL 2.5cm. right renal mass 10 cm. 07/2022 reported 3.7 cm. From 08/2023. Progressive bilateral nodules, RLL 5.7cm. Right RCC 12 cm. Cr. 1.1.   07/17/22 CT compared to 04/2021 reported left renal mass 10.7 x 8.7 cm, previously 7.5 x 7.1 cm.  No enlarged lymph nodes. Multiple bilateral pulmonary nodules largest right lower lobe 3.7 x 3.1 cm, previous 2.6 x 2.1 cm.    07/15/23 CT report 12.4 x 11 cm right renal mass increased  from 10.7 x 8.7 cm. Multiple bilateral lung nodules largest right lower lobe 5.7 x 4.1 cm, previously 3.7 x 3.1 cm.   On initial evaluation he had no worsening cough, shortness of breath, chest pain, discomfort, abdominal pain, or back pain or hematuria.  09/15/23 MRI Brain:   1. No evidence of intracranial metastatic disease. 2. 14 mm enhancing lesion within the right parietal calvarium, new from the prior MRI of 08/16/2019 and highly suspicious for an osseous metastasis. 3. Mild chronic small vessel ischemic changes within the cerebral white matter, similar to the prior MRI. 4. Tiny chronic infarcts within the bilateral cerebellar hemispheres, increased in number. 5. 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 6. Advanced generalized cerebral atrophy, progressed. 7. Paranasal sinus disease as described.  12/27/23 CT CAP 1. No significant change in a bulky renal cell carcinoma arising from the posterior midportion of the left kidney.  No significant change in a bulky, heterogeneously hyperenhancing mass  arising from the posterior midportion of the left kidney, measuring 12.5 x 10.9 cm  2. New and enlarged bilateral pulmonary masses and nodules consistent with worsened pulmonary metastatic disease.  Lungs/Pleura: Continued slight enlargement of multiple bilateral pulmonary masses and nodules, in the peripheral left upper lobe measuring 1.8 x 1.6 cm, previously 1.5 x 1.4 cm (series 12, image 52), in the peripheral posterior right upper lobe measuring 1.7 x 1.5 cm, previously 1.3 x 1.1 cm (series 12, image 76), largest lesion in the medial right lower lobe measuring 5.8 x 4.4 cm, previously 5.7 x 4.1 cm (series 12, image 114). Additionally there is at least 1 new nodule in the dependent right lower lobe measuring 0.8 cm (series 12, image 137).  3. No evidence of lymphadenopathy or metastatic disease in the abdomen or pelvis.  PHYSICAL EXAMINATION: ECOG PERFORMANCE STATUS: 2  VSS  GENERAL: alert, no distress and comfortable SKIN: skin color normal and no jaundice  NECK: No palpable mass LYMPH:  no palpable cervical lymphadenopathy  LUNGS: clear to auscultation and no wheeze or rales with normal breathing effort HEART: regular rate & rhythm  ABDOMEN: abdomen soft, non-tender and nondistended. Palpable left flank mass Musculoskeletal: trace ankle edema NEURO: no focal motor/sensory deficits  Relevant data reviewed during this visit included labs.  New labs ordered.

## 2023-12-30 ENCOUNTER — Inpatient Hospital Stay

## 2023-12-30 ENCOUNTER — Inpatient Hospital Stay (HOSPITAL_BASED_OUTPATIENT_CLINIC_OR_DEPARTMENT_OTHER): Admitting: Nurse Practitioner

## 2023-12-30 ENCOUNTER — Encounter: Payer: Self-pay | Admitting: Nurse Practitioner

## 2023-12-30 ENCOUNTER — Ambulatory Visit: Payer: Self-pay

## 2023-12-30 VITALS — BP 149/57 | HR 66 | Temp 98.3°F | Resp 18 | Ht 70.0 in | Wt 187.5 lb

## 2023-12-30 DIAGNOSIS — F419 Anxiety disorder, unspecified: Secondary | ICD-10-CM | POA: Diagnosis not present

## 2023-12-30 DIAGNOSIS — Z7982 Long term (current) use of aspirin: Secondary | ICD-10-CM | POA: Insufficient documentation

## 2023-12-30 DIAGNOSIS — I6501 Occlusion and stenosis of right vertebral artery: Secondary | ICD-10-CM | POA: Insufficient documentation

## 2023-12-30 DIAGNOSIS — C649 Malignant neoplasm of unspecified kidney, except renal pelvis: Secondary | ICD-10-CM

## 2023-12-30 DIAGNOSIS — C642 Malignant neoplasm of left kidney, except renal pelvis: Secondary | ICD-10-CM | POA: Diagnosis present

## 2023-12-30 DIAGNOSIS — E538 Deficiency of other specified B group vitamins: Secondary | ICD-10-CM | POA: Insufficient documentation

## 2023-12-30 DIAGNOSIS — K5900 Constipation, unspecified: Secondary | ICD-10-CM | POA: Insufficient documentation

## 2023-12-30 DIAGNOSIS — Z7189 Other specified counseling: Secondary | ICD-10-CM | POA: Diagnosis not present

## 2023-12-30 DIAGNOSIS — R5382 Chronic fatigue, unspecified: Secondary | ICD-10-CM | POA: Insufficient documentation

## 2023-12-30 DIAGNOSIS — I252 Old myocardial infarction: Secondary | ICD-10-CM | POA: Insufficient documentation

## 2023-12-30 DIAGNOSIS — C7951 Secondary malignant neoplasm of bone: Secondary | ICD-10-CM | POA: Diagnosis not present

## 2023-12-30 DIAGNOSIS — C7801 Secondary malignant neoplasm of right lung: Secondary | ICD-10-CM | POA: Insufficient documentation

## 2023-12-30 DIAGNOSIS — I6782 Cerebral ischemia: Secondary | ICD-10-CM | POA: Insufficient documentation

## 2023-12-30 DIAGNOSIS — C7802 Secondary malignant neoplasm of left lung: Secondary | ICD-10-CM | POA: Diagnosis not present

## 2023-12-30 DIAGNOSIS — Z87891 Personal history of nicotine dependence: Secondary | ICD-10-CM | POA: Insufficient documentation

## 2023-12-30 DIAGNOSIS — Z66 Do not resuscitate: Secondary | ICD-10-CM | POA: Diagnosis not present

## 2023-12-30 DIAGNOSIS — R110 Nausea: Secondary | ICD-10-CM | POA: Diagnosis not present

## 2023-12-30 DIAGNOSIS — Z85810 Personal history of malignant neoplasm of tongue: Secondary | ICD-10-CM | POA: Insufficient documentation

## 2023-12-30 DIAGNOSIS — D509 Iron deficiency anemia, unspecified: Secondary | ICD-10-CM | POA: Diagnosis not present

## 2023-12-30 DIAGNOSIS — Z8673 Personal history of transient ischemic attack (TIA), and cerebral infarction without residual deficits: Secondary | ICD-10-CM | POA: Diagnosis not present

## 2023-12-30 DIAGNOSIS — E785 Hyperlipidemia, unspecified: Secondary | ICD-10-CM | POA: Diagnosis not present

## 2023-12-30 DIAGNOSIS — G893 Neoplasm related pain (acute) (chronic): Secondary | ICD-10-CM | POA: Diagnosis not present

## 2023-12-30 DIAGNOSIS — E119 Type 2 diabetes mellitus without complications: Secondary | ICD-10-CM | POA: Diagnosis not present

## 2023-12-30 DIAGNOSIS — I2510 Atherosclerotic heart disease of native coronary artery without angina pectoris: Secondary | ICD-10-CM | POA: Diagnosis not present

## 2023-12-30 DIAGNOSIS — C7800 Secondary malignant neoplasm of unspecified lung: Secondary | ICD-10-CM

## 2023-12-30 DIAGNOSIS — Z79899 Other long term (current) drug therapy: Secondary | ICD-10-CM | POA: Insufficient documentation

## 2023-12-30 DIAGNOSIS — I10 Essential (primary) hypertension: Secondary | ICD-10-CM | POA: Diagnosis not present

## 2023-12-30 DIAGNOSIS — Z880 Allergy status to penicillin: Secondary | ICD-10-CM | POA: Insufficient documentation

## 2023-12-30 DIAGNOSIS — Z888 Allergy status to other drugs, medicaments and biological substances status: Secondary | ICD-10-CM | POA: Insufficient documentation

## 2023-12-30 DIAGNOSIS — G4730 Sleep apnea, unspecified: Secondary | ICD-10-CM | POA: Diagnosis not present

## 2023-12-30 DIAGNOSIS — F515 Nightmare disorder: Secondary | ICD-10-CM | POA: Diagnosis not present

## 2023-12-30 DIAGNOSIS — Z882 Allergy status to sulfonamides status: Secondary | ICD-10-CM | POA: Insufficient documentation

## 2023-12-30 DIAGNOSIS — Z881 Allergy status to other antibiotic agents status: Secondary | ICD-10-CM | POA: Insufficient documentation

## 2023-12-30 LAB — CMP (CANCER CENTER ONLY)
ALT: 12 U/L (ref 0–44)
AST: 16 U/L (ref 15–41)
Albumin: 3.4 g/dL — ABNORMAL LOW (ref 3.5–5.0)
Alkaline Phosphatase: 50 U/L (ref 38–126)
Anion gap: 7 (ref 5–15)
BUN: 25 mg/dL — ABNORMAL HIGH (ref 8–23)
CO2: 25 mmol/L (ref 22–32)
Calcium: 9.9 mg/dL (ref 8.9–10.3)
Chloride: 102 mmol/L (ref 98–111)
Creatinine: 1.48 mg/dL — ABNORMAL HIGH (ref 0.61–1.24)
GFR, Estimated: 47 mL/min — ABNORMAL LOW (ref >60)
Glucose, Bld: 221 mg/dL — ABNORMAL HIGH (ref 70–99)
Potassium: 4 mmol/L (ref 3.5–5.1)
Sodium: 134 mmol/L — ABNORMAL LOW (ref 135–145)
Total Bilirubin: 0.3 mg/dL (ref 0.0–1.2)
Total Protein: 6.4 g/dL — ABNORMAL LOW (ref 6.5–8.1)

## 2023-12-30 LAB — CBC WITH DIFFERENTIAL (CANCER CENTER ONLY)
Abs Immature Granulocytes: 0.02 K/uL (ref 0.00–0.07)
Basophils Absolute: 0 K/uL (ref 0.0–0.1)
Basophils Relative: 1 %
Eosinophils Absolute: 0.2 K/uL (ref 0.0–0.5)
Eosinophils Relative: 2 %
HCT: 37.8 % — ABNORMAL LOW (ref 39.0–52.0)
Hemoglobin: 12.4 g/dL — ABNORMAL LOW (ref 13.0–17.0)
Immature Granulocytes: 0 %
Lymphocytes Relative: 22 %
Lymphs Abs: 1.5 K/uL (ref 0.7–4.0)
MCH: 25.3 pg — ABNORMAL LOW (ref 26.0–34.0)
MCHC: 32.8 g/dL (ref 30.0–36.0)
MCV: 77 fL — ABNORMAL LOW (ref 80.0–100.0)
Monocytes Absolute: 0.6 K/uL (ref 0.1–1.0)
Monocytes Relative: 8 %
Neutro Abs: 4.7 K/uL (ref 1.7–7.7)
Neutrophils Relative %: 67 %
Platelet Count: 219 K/uL (ref 150–400)
RBC: 4.91 MIL/uL (ref 4.22–5.81)
RDW: 14.6 % (ref 11.5–15.5)
WBC Count: 7 K/uL (ref 4.0–10.5)
nRBC: 0 % (ref 0.0–0.2)

## 2023-12-30 LAB — LACTATE DEHYDROGENASE: LDH: 161 U/L (ref 98–192)

## 2023-12-30 MED ORDER — ONDANSETRON HCL 4 MG PO TABS: 4.0000 mg | ORAL_TABLET | Freq: Three times a day (TID) | ORAL | 0 refills | Status: AC | PRN

## 2023-12-30 MED ORDER — CLONAZEPAM 1 MG PO TABS: 1.0000 mg | ORAL_TABLET | Freq: Two times a day (BID) | ORAL | 0 refills | Status: DC | PRN

## 2023-12-30 NOTE — Assessment & Plan Note (Addendum)
 Continue B12 1000 mcg daily. Repeat next visit.

## 2023-12-30 NOTE — Assessment & Plan Note (Addendum)
 CT CAP in about 4 months Repeat MRI in about 4 months. Consider biopsy in the future if progression and ready for treatment Discussed earlier follow-up if new concerning symptoms such as progressive worsening fatigue, loss of appetite, edema, abdominal pain and distention, hematuria

## 2023-12-31 ENCOUNTER — Ambulatory Visit

## 2023-12-31 ENCOUNTER — Other Ambulatory Visit

## 2024-01-02 NOTE — Progress Notes (Signed)
 Palliative Medicine [facility]  [phone] [phone]   Name: Alexis Griffith Date: 01/02/2024 MRN: [id number]  DOB: 1941-01-09  Patient Care Team: Vernadine Charlie LELON, MD as PCP - General (Internal Medicine) Fernande Elspeth BROCKS, MD (Inactive) as PCP - Electrophysiology (Cardiology) Wonda Sharper, MD as PCP - Structural Heart (Cardiology) Jordan, Peter M, MD as PCP - Cardiology (Cardiology) Pickenpack-Cousar, Fannie SAILOR, NP as Nurse Practitioner (Hospice and Palliative Medicine)   INTERVAL HISTORY: Alexis Griffith is a 83 y.o. 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  Palliative is seeing patient for symptom management and goals of care.   SOCIAL HISTORY:     reports that he quit smoking about 48 years ago. His smoking use included cigarettes. He has never used smokeless tobacco. He reports that he does not drink alcohol  and does not use drugs.  ADVANCE DIRECTIVES:  Patient has documents. Encouraged to bring in a copy. Wife is clinical research associate.   CODE STATUS: DNR  PAST MEDICAL HISTORY: Past Medical History:  Diagnosis Date   Anxiety    Bladder neck obstruction 06/05/2013   CAD (coronary artery disease)    Carotid artery disease    CTA 08/2019 of the head and neck show moderate 65 to 75% bilateral ICA stenoses    Chronic diastolic CHF (congestive heart failure) (HCC)    Diabetes mellitus type 2 in nonobese (HCC)    Excessive urination at night 06/05/2013   Giant cell arteritis (HCC) 05/17/2012   Hyperlipidemia    Hypertension    Hypertensive urgency    Hypomagnesemia 09/11/2019   Mild anemia 09/11/2019   NSTEMI (non-ST elevated myocardial infarction) (HCC) 09/09/2019    Orthostatic hypotension 09/11/2019   Premature atrial contractions 09/12/2019   Pulmonary nodules    PVC (premature ventricular contraction)    Renal mass    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 Wonda & Dr. Lucas   Severe aortic stenosis    Sinus bradycardia on ECG    Sleep apnea    Squamous cell carcinoma of tongue [facility]) September 2012   Followed by Dr. Mable.   Stroke [facility])    TIA (transient ischemic attack)    Urinary retention 09/11/2019    ALLERGIES:  is allergic to macrodantin [nitrofurantoin], tape, sulfa antibiotics, bioflavonoid products, lisinopril, metformin  hcl, nutritional supplements, nutritional supplements, penicillins, pioglitazone, and sulfamethoxazole-trimethoprim.  MEDICATIONS:  Current Outpatient Medications  Medication Sig Dispense Refill   acetaminophen  (TYLENOL ) 325 MG tablet Take 650 mg by mouth every 6 (six) hours as needed for mild pain.     aspirin  81 MG chewable tablet Chew 1 tablet (81 mg total) by mouth daily. 360 tablet 0   carvedilol  (COREG ) 25 MG tablet Take 1 tablet (25 mg total) by mouth 2 (two) times daily. 180 tablet 3   Choline Fenofibrate  (FENOFIBRIC ACID) 135 MG CPDR Take 1 capsule by mouth daily.     clonazePAM  (KLONOPIN ) 1 MG tablet Take 1 tablet (1 mg total) by mouth 2 (two) times daily as needed for anxiety. 45 tablet 0   doxazosin  (CARDURA ) 4 MG tablet TAKE ONE TABLET BY MOUTH DAILY 90 tablet 3   FARXIGA 10 MG TABS  tablet Take 10 mg by mouth daily.     glucose blood test strip 1 each by Other route as needed.      hydrALAZINE  (APRESOLINE ) 25 MG tablet Take 1 tablet (25 mg total) by mouth 3 (three) times daily. 90 tablet 3   insulin  glargine (LANTUS ) 100 UNIT/ML injection Inject 70 Units into the skin daily.     irbesartan  (AVAPRO ) 300 MG tablet Take 1 tablet (300 mg total) by mouth daily. 30 tablet 11   isosorbide  mononitrate (IMDUR ) 120 MG 24 hr tablet  Take 1 tablet (120 mg total) by mouth daily. 90 tablet 3   Lancets (ONETOUCH DELICA PLUS LANCET33G) MISC 1 each by Other route daily.      magnesium  oxide (MAG-OX) 400 MG tablet Take 400 mg by mouth daily.     nitroGLYCERIN  (NITROSTAT ) 0.4 MG SL tablet PLACE 1 TABLET UNDER THE TONGUE EVERY 5 MINUTES AS NEEDED FOR CHEST PAIN UP TO 3 DOSES 25 tablet 9   ondansetron  (ZOFRAN ) 4 MG tablet Take 1 tablet (4 mg total) by mouth every 8 (eight) hours as needed for nausea or vomiting. 30 tablet 0   ONE TOUCH ULTRA TEST test strip 1 each by Other route daily.      ranolazine  (RANEXA ) 500 MG 12 hr tablet Take 1 tablet (500 mg total) by mouth 2 (two) times daily. 180 tablet 3   rosuvastatin  (CRESTOR ) 10 MG tablet TAKE 1 TABLET by mouth ONCE DAILY. 90 tablet 2   senna-docusate (SENNA S) 8.6-50 MG tablet Take 2 tablets by mouth at bedtime as needed for mild constipation. 60 tablet 3   traMADol  (ULTRAM ) 50 MG tablet Take 1 tablet (50 mg total) by mouth every 8 (eight) hours as needed. 60 tablet 0   No current facility-administered medications for this visit.    VITAL SIGNS: BP (!) 149/57 (BP Location: Right Arm, Patient Position: Sitting)   Pulse 66   Temp 98.3 F (36.8 C) (Temporal)   Resp 18   Ht 5' 10 (1.778 m)   Wt 187 lb 8 oz (85 kg)   SpO2 95%   BMI 26.90 kg/m  Filed Weights   12/30/23 1431  Weight: 187 lb 8 oz (85 kg)    Estimated body mass index is 26.9 kg/m as calculated from the following:   Height as of this encounter: 5' 10 (1.778 m).   Weight as of this encounter: 187 lb 8 oz (85 kg).  PERFORMANCE STATUS (ECOG) : 1 - Symptomatic but completely ambulatory  Assessment NAD RRR Normal breathing pattern AAO x3  IMPRESSION: Discussed the use of AI scribe software for clinical note transcription with the patient, who gave verbal consent to proceed.  History of Present Illness Alexis Griffith is an 83 year old male who I connected with metastatic renal cell carcinoma who presented  to clinic for symptom management follow-up. No acute distress. He confirms plan for upcoming appointments.   Reports a persistent cough in the mornings and does not take any allergy medications like Zyrtec or Claritin.  He experiences occasional constipation, for which he takes a stool softener. His appetite remains good, and his weight is stable at 187 pounds.  He has experienced chronic pain for a long time, describing it as something he has had 'all my life'. States he has become accustomed to it over time.  We discussed his pain at length. Mr. Farace takes tramadol  for pain management, stating that he takes it as needed, usually one tablet, but  sometimes considers taking two if the pain is severe. Tramadol  does not significantly alleviate his pain and adds to his nightmares. No adjustments at this time.   Mr. Cordova was prescribed tramadol  for pain management but has not taken it frequently. He confirms taking at least one dose, which did not significantly alleviate his pain, and mentions that he has not needed to take it regularly. Education provided on pain relief and ability to take Tramadol  1-2 tablets every 6 hours as needed. He verbalized understanding.   He experiences nightmares every night since undergoing surgery, which he believes may be related to the operation. The nightmares are severe and frequent, causing him to wake up and fear returning to sleep. He has been taking Klonopin , initially prescribed at four tablets per day, but was reduced to one tablet per day post-surgery. He currently takes 0.5 mg of Klonopin , half in the morning and half in the evening, but sometimes takes an additional 0.5 mg at night to help with sleep. Patient previously on 4 mg daily as needed. He reports some improvement in use. He is aware we will continue use of Klonopin  with increase to 1mg  twice daily as needed,   All questions answerd and support provided.   Assessment & Plan Cancer relaed pain  syndrome Chronic pain syndrome persists but is not severe enough to necessitate regular tramadol  use. Tramadol  has been ineffective when used. He is accustomed to the pain and has multiple other health concerns. - Advise tramadol  as needed for severe pain, with the option to take one or two tablets if pain becomes intolerable.   Nightmares and sleep disturbance Chronic nightmares occurring nightly, potentially exacerbated by reduction in Klonopin  dosage from 4 mg to 1 mg daily. Nightmares may also be related to past surgery. Klonopin  aids sleep at higher doses. - Increase Klonopin  to 1 mg twice daily, with a new prescription for 1 mg tablets. - Use current supply of 0.5 mg tablets to test efficacy of increased dosage before starting new prescription. - Monitor effectiveness of increased Klonopin  dosage on nightmares and sleep quality.  Chronic pain Chronic pain managed with tramadol  as needed. Tramadol  is taken occasionally, with variable effectiveness. Pain severity varies, and tramadol  dosage is adjusted accordingly. - Continue tramadol  as needed, 1-2 tablets every 8 hours depending on pain severity. - Monitor pain levels and adjust tramadol  dosage as needed.  Constipation Intermittent constipation managed with stool softeners. - Continue stool softeners as needed.  Cough Morning cough possibly related to allergies. No current use of antihistamines due to concerns about blood pressure interactions. - Consider trial of Claritin, as it is less likely to affect blood pressure.  I will plan to see patient back in 2-3 weeks. Sooner if needed.   Patient expressed understanding and was in agreement with this plan. He also understands that He can call the clinic at any time with any questions, concerns, or complaints.   Any controlled substances utilized were prescribed in the context of palliative care. PDMP has been reviewed.   Visit consisted of counseling and education dealing with the  complex and emotionally intense issues of symptom management and palliative care in the setting of serious and potentially life-threatening illness.  Levon Borer, AGPCNP-BC  Palliative Medicine Team/[facility]

## 2024-01-03 ENCOUNTER — Telehealth: Payer: Self-pay | Admitting: Radiation Oncology

## 2024-01-03 ENCOUNTER — Telehealth: Payer: Self-pay

## 2024-01-03 NOTE — Telephone Encounter (Signed)
 Scheduled patient for next appointments. Called and spoke with the patient, he is aware.

## 2024-01-03 NOTE — Telephone Encounter (Signed)
 I called and shared the results with the patient from his MRI scan. He was happy to hear this in addition to having reviewed this with Dr. Tina. I let the patient know we will plan to follow up with his next scan after he has this in the spring, and would be happy to talk with him or see him sooner if needed.

## 2024-01-07 ENCOUNTER — Other Ambulatory Visit: Payer: Self-pay | Admitting: Physician Assistant

## 2024-01-10 MED ORDER — ROSUVASTATIN CALCIUM 10 MG PO TABS: 10.0000 mg | ORAL_TABLET | Freq: Every day | ORAL | 3 refills | Status: AC

## 2024-01-25 ENCOUNTER — Telehealth: Payer: Self-pay | Admitting: Nurse Practitioner

## 2024-01-25 ENCOUNTER — Encounter: Payer: Self-pay | Admitting: Nurse Practitioner

## 2024-01-25 ENCOUNTER — Inpatient Hospital Stay: Admitting: Nurse Practitioner

## 2024-01-25 DIAGNOSIS — G893 Neoplasm related pain (acute) (chronic): Secondary | ICD-10-CM

## 2024-01-25 DIAGNOSIS — Z515 Encounter for palliative care: Secondary | ICD-10-CM

## 2024-01-25 DIAGNOSIS — C649 Malignant neoplasm of unspecified kidney, except renal pelvis: Secondary | ICD-10-CM

## 2024-01-25 DIAGNOSIS — F419 Anxiety disorder, unspecified: Secondary | ICD-10-CM

## 2024-01-25 NOTE — Progress Notes (Signed)
 Palliative Medicine [facility]  Telephone:(336) [phone] Fax:(336) [phone]   Name: Alexis Griffith Date: 01/25/2024 MRN: [id number]  DOB: [birth year]  Patient Care Team: Tisovec, Richard W, MD as PCP - General (Internal Medicine) Fernande Elspeth BROCKS, MD (Inactive) as PCP - Electrophysiology (Cardiology) Wonda Sharper, MD as PCP - Structural Heart (Cardiology) Jordan, Peter M, MD as PCP - Cardiology (Cardiology) Pickenpack-Cousar, Fannie SAILOR, NP as Nurse Practitioner [facility] and Palliative Medicine)   I connected with Alexis Griffith on 01/25/24 at 12:15 PM EST by telephone and verified that I am speaking with the correct person using two identifiers.   I discussed the limitations, risks, security and privacy concerns of performing an evaluation and management service by telemedicine and the availability of in-person appointments. I also discussed with the patient that there may be a patient responsible charge related to this service. The patient expressed understanding and agreed to proceed.   Other persons participating in the visit and their role in the encounter: N/A   Patient's location: [place]   Provider's location: [facility]   INTERVAL HISTORY: Alexis Griffith is a 83 y.o. 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  Palliative is seeing patient for symptom management and goals of care.   SOCIAL HISTORY:     reports that he quit smoking about 48 years ago. His smoking use included cigarettes. He has never used smokeless tobacco. He reports that he does not drink alcohol  and does not use drugs.  ADVANCE DIRECTIVES:  Patient has documents. Encouraged to bring in a copy. Wife is clinical research associate.    CODE STATUS: DNR  PAST MEDICAL HISTORY: Past Medical History:  Diagnosis Date   Anxiety    Bladder neck obstruction 06/05/2013   CAD (coronary artery disease)    Carotid artery disease    CTA 08/2019 of the head and neck show moderate 65 to 75% bilateral ICA stenoses    Chronic diastolic CHF (congestive heart failure) (HCC)    Diabetes mellitus type 2 in nonobese (HCC)    Excessive urination at night 06/05/2013   Giant cell arteritis (HCC) 05/17/2012   Hyperlipidemia    Hypertension    Hypertensive urgency    Hypomagnesemia 09/11/2019   Mild anemia 09/11/2019   NSTEMI (non-ST elevated myocardial infarction) (HCC) 09/09/2019   Orthostatic hypotension 09/11/2019   Premature atrial contractions 09/12/2019   Pulmonary nodules    PVC (premature ventricular contraction)    Renal mass    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 Wonda & Dr. Lucas   Severe aortic stenosis    Sinus bradycardia on ECG    Sleep apnea    Squamous cell carcinoma of tongue [facility]) September 2012   Followed by Dr. Mable.   Stroke [facility])    TIA (transient ischemic attack)    Urinary retention 09/11/2019    ALLERGIES:  is allergic to macrodantin [nitrofurantoin], tape, sulfa antibiotics, bioflavonoid products, lisinopril, metformin  hcl, nutritional supplements, nutritional supplements, penicillins, pioglitazone, and sulfamethoxazole-trimethoprim.  MEDICATIONS:  Current Outpatient Medications  Medication Sig Dispense Refill   acetaminophen  (TYLENOL ) 325 MG tablet Take 650 mg by mouth every 6 (six) hours as needed for mild pain.  aspirin  81 MG chewable tablet Chew 1 tablet (81 mg total) by mouth daily. 360 tablet 0   carvedilol  (COREG ) 25 MG tablet Take 1 tablet (25 mg total) by mouth 2 (two) times daily. 180 tablet 3   Choline Fenofibrate  (FENOFIBRIC ACID) 135 MG CPDR Take 1 capsule by mouth daily.     clonazePAM  (KLONOPIN ) 1 MG  tablet Take 1 tablet (1 mg total) by mouth 2 (two) times daily as needed for anxiety. 45 tablet 0   doxazosin  (CARDURA ) 4 MG tablet TAKE ONE TABLET BY MOUTH DAILY 90 tablet 3   FARXIGA 10 MG TABS tablet Take 10 mg by mouth daily.     glucose blood test strip 1 each by Other route as needed.      hydrALAZINE  (APRESOLINE ) 25 MG tablet Take 1 tablet (25 mg total) by mouth 3 (three) times daily. 90 tablet 3   insulin  glargine (LANTUS ) 100 UNIT/ML injection Inject 70 Units into the skin daily.     irbesartan  (AVAPRO ) 300 MG tablet Take 1 tablet (300 mg total) by mouth daily. 30 tablet 11   isosorbide  mononitrate (IMDUR ) 120 MG 24 hr tablet Take 1 tablet (120 mg total) by mouth daily. 90 tablet 3   Lancets (ONETOUCH DELICA PLUS LANCET33G) MISC 1 each by Other route daily.      magnesium  oxide (MAG-OX) 400 MG tablet Take 400 mg by mouth daily.     nitroGLYCERIN  (NITROSTAT ) 0.4 MG SL tablet PLACE 1 TABLET UNDER THE TONGUE EVERY 5 MINUTES AS NEEDED FOR CHEST PAIN UP TO 3 DOSES 25 tablet 9   ondansetron  (ZOFRAN ) 4 MG tablet Take 1 tablet (4 mg total) by mouth every 8 (eight) hours as needed for nausea or vomiting. 30 tablet 0   ONE TOUCH ULTRA TEST test strip 1 each by Other route daily.      ranolazine  (RANEXA ) 500 MG 12 hr tablet Take 1 tablet (500 mg total) by mouth 2 (two) times daily. 180 tablet 3   rosuvastatin  (CRESTOR ) 10 MG tablet Take 1 tablet (10 mg total) by mouth daily. 90 tablet 3   senna-docusate (SENNA S) 8.6-50 MG tablet Take 2 tablets by mouth at bedtime as needed for mild constipation. 60 tablet 3   traMADol  (ULTRAM ) 50 MG tablet Take 1 tablet (50 mg total) by mouth every 8 (eight) hours as needed. 60 tablet 0   No current facility-administered medications for this visit.    VITAL SIGNS: There were no vitals taken for this visit. There were no vitals filed for this visit.   Estimated body mass index is 26.9 kg/m as calculated from the following:   Height as of 12/30/23: 5' 10  (1.778 m).   Weight as of 12/30/23: 187 lb 8 oz (85 kg).  PERFORMANCE STATUS (ECOG) : 1 - Symptomatic but completely ambulatory   IMPRESSION: Discussed the use of AI scribe software for clinical note transcription with the patient, who gave verbal consent to proceed.  History of Present Illness Alexis Griffith is an 83 year old male with metastatic renal cell carcinoma who I connected with by telephone for symptom management follow-up. No acute distress. Denies concerns of uncontrolled nausea, vomiting, constipation, or diarrhea. His appetite is fair. Some days are better than others.   We discussed his pain and anxiety. He takes Tramadol  as needed for his pain. He has been adjusting his Klonopin  dosage. Initially, he was prescribed 0.5 mg tablets, which he was breaking in half to take 0.25 mg doses.  Recently, he was prescribed 1 mg tablets, which he has been breaking in half to take 0.5 mg doses twice daily, resulting in a total of 1 mg per day. We discussed daily dose of 1mg  twice daily as this helps with his anxiety and relaxation at night to prevent nightmares. He verbalized understanding and appreciation.   All questions answerd and support provided.   Assessment & Plan Cancer relaed pain syndrome Chronic pain syndrome persists but is not severe enough to necessitate regular tramadol  use. Tramadol  has been ineffective when used. He is accustomed to the pain and has multiple other health concerns. - Advise tramadol  as needed for severe pain, with the option to take one or two tablets if pain becomes intolerable.   Nightmares and sleep disturbance Chronic nightmares occurring nightly, potentially exacerbated by reduction in Klonopin  dosage from 4 mg to 1 mg daily. Nightmares may also be related to past surgery. Klonopin  aids sleep at higher doses. - Increase Klonopin  to 1 mg twice daily, with a new prescription for 1 mg tablets. - Use current supply of 0.5 mg tablets to test efficacy of  increased dosage before starting new prescription. - Monitor effectiveness of increased Klonopin  dosage on nightmares and sleep quality.  Chronic pain Chronic pain managed with tramadol  as needed. Tramadol  is taken occasionally, with variable effectiveness. Pain severity varies, and tramadol  dosage is adjusted accordingly. - Continue tramadol  as needed, 1-2 tablets every 8 hours depending on pain severity. - Monitor pain levels and adjust tramadol  dosage as needed.  Constipation Intermittent constipation managed with stool softeners. - Continue stool softeners as needed.  I will plan to see patient back in 2-3 weeks. Sooner if needed.   Patient expressed understanding and was in agreement with this plan. He also understands that He can call the clinic at any time with any questions, concerns, or complaints.   Any controlled substances utilized were prescribed in the context of palliative care. PDMP has been reviewed.   I provided 30 minutes of non face-to-face telephone visit time during this encounter, and > 50% was spent counseling as documented under my assessment & plan. Visit consisted of counseling and education dealing with the complex and emotionally intense issues of symptom management and palliative care in the setting of serious and potentially life-threatening illness.  Levon Borer, AGPCNP-BC  Palliative Medicine Team/[facility]

## 2024-01-25 NOTE — Telephone Encounter (Signed)
 I spoke with patient to schedule palliative care appointment on 03/07/2024. Patient aware of date/time.

## 2024-02-02 ENCOUNTER — Other Ambulatory Visit: Payer: Self-pay | Admitting: Nurse Practitioner

## 2024-02-02 DIAGNOSIS — C7951 Secondary malignant neoplasm of bone: Secondary | ICD-10-CM

## 2024-02-02 DIAGNOSIS — C642 Malignant neoplasm of left kidney, except renal pelvis: Secondary | ICD-10-CM

## 2024-02-02 DIAGNOSIS — Z515 Encounter for palliative care: Secondary | ICD-10-CM

## 2024-02-02 DIAGNOSIS — G893 Neoplasm related pain (acute) (chronic): Secondary | ICD-10-CM

## 2024-02-02 DIAGNOSIS — C7801 Secondary malignant neoplasm of right lung: Secondary | ICD-10-CM

## 2024-02-09 ENCOUNTER — Other Ambulatory Visit: Payer: Self-pay | Admitting: Nurse Practitioner

## 2024-02-09 DIAGNOSIS — C649 Malignant neoplasm of unspecified kidney, except renal pelvis: Secondary | ICD-10-CM

## 2024-02-09 DIAGNOSIS — F419 Anxiety disorder, unspecified: Secondary | ICD-10-CM

## 2024-02-10 NOTE — Progress Notes (Unsigned)
 Cardiology Office Note   Date:  02/14/2024  ID:  Zahki, Hoogendoorn 1940/03/31, MRN [id number] PCP: Vernadine Charlie LELON, MD  [facility] Providers Cardiologist:  Lucienne Sawyers, MD Electrophysiologist:  Elspeth Sage, MD (Inactive)  Structural Heart:  Ozell Fell, MD    History of Present Illness DIXIE JAFRI is a 83 y.o. male is seen to establish cardiac care. He is a former patient of Dr Alveta. 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  Coronary CTA in 2021 revealed three-vessel CAD.  Subsequent cardiac catheterization performed on 07/07/2019 showed 30% ostial left main, 70% proximal to mid LAD, 70% D2, 40% mid to distal LAD, 75% proximal to mid left circumflex lesion, 100% occluded ostial to distal RCA lesion with significant left-to-right collaterals filling the PDA and PL system, 50% RPDA lesion, severe aortic stenosis.  CT surgery was consulted to consider options between two-vessel PCI and TAVR versus CABG/TAVR.  Ultimately it was decided to proceed with TAVR and treat coronary artery disease medically since patient denied any chest discomfort.  He eventually underwent TAVR procedure in June 2021, postprocedure, he had difficulty mobilizing and felt unsteady.  MRI of the brain showed a 4 mm acute infarct of the left medial frontal cortex which ultimately felt to be an incidental finding.   He had a ER visit in January 2022 was near syncope thought to be related to anxiety and orthostatic hypotension.  Heart monitor in 2024 was normal with single episode of NSVT 17 beats.  Duplex ultrasound in July 2024 showed moderate disease in the right ICA, mild disease on the left side.  He was seen in June 2025 complaining of 3 to 4 weeks of intermittent chest discomfort requiring nitroglycerin .  Blood  pressure was in the 190s.  Lopressor  was switched to carvedilol  25 mg twice a day.  Echocardiogram was also ordered.  He was recently set up with Dr. Tina of oncology service.  CT imaging in May showed a large left kidney mass measuring 12.4 x 11 cm.  This mass was previously measuring 10.7 x 8.7 cm in May 2024.  There are other lung nodules that also gradually increase in size over the years.  MRI of the brain showed a 14 mm enhancing lesion in the parietal calvarium.  He has been seen by radiation oncology service Dr. Norleen Limes on 09/23/2023, it appears that the decision is to hold off on treatment and plan to repeat MRI in 3 months to reassess the enhancing lesion in the perianal calvarium.  Echocardiogram on 8//2025 which showed EF 60 to 65%, no regional wall motion abnormality, normal RV, mild MR, normal functioning aortic valve prosthesis.  Carotid ultrasound showed moderate right ICA disease, mild left ICA disease. Imdur  dose was also increased.   The patient is seen with his wife. He notes some intermittent chest pain in the left precordium. Occurs at random. Not associated with activity and no relief with sl Ntg. States he never really recovered from his TAVR in 2021 - not sure how much related to his other medical issues. Makes it clear he is interested in quality of life more than quantity. He does not sleep well. Notes horrible nightmares. Naps some during the day. Would like to scale back on some of his meds if feasible.   ROS:  Patient complains of occasional chest discomfort primarily happens at night, he denies any significant shortness of breath, lower extremity edema, orthopnea or PND.  Studies Reviewed     Labs dated 03/17/23: cholesterol 147, triglycerides 108, HDL 75, LDL 50  Cardiac Studies & Procedures   ______________________________________________________________________________________________ CARDIAC CATHETERIZATION  CARDIAC CATHETERIZATION 07/07/2019  Conclusion  Ost  LM lesion is 30% stenosed.  Prox LAD to Mid LAD lesion is 70% stenosed with 70% stenosed side branch in 2nd Diag. Both vessels positive by CT FFR  Mid LAD to Dist LAD lesion is 40% stenosed. Dist LAD lesion is 75% stenosed.  Prox Cx lesion is 45% stenosed. Prox Cx to Mid Cx lesion is 75% stenosed. RFR positive is 0.87  Mid Cx lesion is 5% stenosed with 50% stenosed side branch in 3rd Mrg. No significant drop in RFR reading beyond this lesion.  Ost RCA to Dist RCA lesion is 100% stenosed. Significant left to right collaterals fill the PDA and PL system.  RPDA lesion is 50% stenosed.  --------------------------------------  The left ventricular systolic function is normal. The left ventricular ejection fraction is 55-65% by visual estimate. Regional wall motion knowledged and inferior wall.  LV end diastolic pressure is moderately elevated.  There is ~SEVERE AORTIC VALVE STENOSIS.   SUMMARY  Severe three-vessel disease: 100% CTO of ostial RCA (left to right collaterals fill PDA and PL), long 70% calcified proximal to mid LAD (positive by CT FFR), segmental ~75% proximal-mid LCx (positive by RFR)  Borderline SEVERE AORTIC STENOSIS-mean gradient estimated 37 mmHg by pullback.  Normal LVEF with basal inferior hypokinesis seen on echocardiogram and LV gram.   Plan will be to consult CVTS/Cardiology Valve Clinic to consider options between two-vessel PCI and TAVR versus CABG/AVR. He is not having resting chest pain, will discharge home today on Imdur  pending Valve Clinic evaluation.SABRA [name], MD  Findings Coronary Findings Diagnostic  Dominance: Right  Left Main Vessel was injected. Vessel is large. Very short LEFT MAIN Ost LM lesion is 30% stenosed. The lesion is eccentric. The lesion is calcified.  Left Anterior Descending Prox LAD to Mid LAD lesion is 70% stenosed with 70% stenosed side branch in 2nd Diag. The lesion is located at the bend, segmental and eccentric.  The lesion is moderately calcified. This segment was considered significant by CT FFR (0.76) and is angiographically significant. Mid LAD to Dist LAD lesion is 40% stenosed. Dist LAD lesion is 75% stenosed. The lesion is focal. Apical  First Diagonal Branch Vessel is small in size.  First Septal Branch Vessel is small in size.  Second Diagonal Branch Vessel is moderate in size.  Second Septal Branch Vessel is small in size.  Third Septal Branch Vessel is small in size.  Left Circumflex Vessel is large. The distal circumflex courses is a 2nd Mrg Prox Cx lesion is 45% stenosed. The lesion is focal and eccentric. The lesion is mildly calcified. Prox Cx to Mid Cx lesion is 75% stenosed. The lesion is located proximal to the major branch, segmental, eccentric and irregular. Pressure wire/FFR was performed on the lesion. RFR as follows: OM3 =0.84, Ostial OM3 = 0.86, pre OM3 = 0.87; pre OM1 = 0.93 --> this indicates that the lesion becomes significant in the segment and does not require the ostial OM 3 lesion. Mid Cx lesion is 5% stenosed with 50% stenosed side branch in 3rd Mrg. The lesion is located at the bifurcation and focal. Just after lesion RFR 0.86 & mid branch  0.84  First Obtuse Marginal Branch Vessel is small in size.  Third Obtuse Marginal Branch Vessel is moderate in size.  Left Posterior Atrioventricular Artery Vessel is small in size.  Right Coronary Artery Ost RCA to Dist RCA lesion is 100% stenosed. The lesion is chronically occluded with left-to-left collateral flow.  Right Posterior Descending Artery The vessel exhibits minimal luminal irregularities. Collaterals RPDA filled by collaterals from 2nd Sept.  Collaterals RPDA filled by collaterals from 3rd Sept.  RPDA lesion is 50% stenosed. The lesion is distal to major branch and focal.  Right Posterior Atrioventricular Artery Vessel is moderate in size. The vessel exhibits minimal luminal  irregularities.  First Right Posterolateral Branch Collaterals 1st RPL filled by collaterals from Dist Cx.  Second Right Posterolateral Branch Vessel is small in size. Collaterals 2nd RPL filled by collaterals from 3rd Mrg.  Intervention  No interventions have been documented.     ECHOCARDIOGRAM  ECHOCARDIOGRAM COMPLETE 10/04/2023  Narrative ECHOCARDIOGRAM REPORT    Patient Name:   LUISMARIO COSTON Date of Exam: 10/04/2023 Medical Rec #:  [id number]      Height:       70.0 in Accession #:    [id number]     Weight:       191.1 lb Date of Birth:  [birth year]      BSA:          2.047 m Patient Age:    82 years       BP:           179/79 mmHg Patient Gender: M              HR:           71 bpm. Exam Location:  Outpatient  Procedure: 2D Echo, Cardiac Doppler, Color Doppler and Strain Analysis (Both Spectral and Color Flow Doppler were utilized during procedure).  Indications:    S/P TAVR Z95.2  History:        Patient has prior history of Echocardiogram examinations. NSTEMI and CAD, TIA and Stroke, Aortic Valve Disease, Arrythmias:Sinus Nola and PVC; Risk Factors:Hypertension, Diabetes and Hyperlipidemia. S/P Edwards Sapien TAVR, placed 08/15/2019.  Sonographer:    Sharlet Hamilton RDCS Referring Phys: [id number] SHENG L HALEY  IMPRESSIONS   1. Left ventricular ejection fraction, by estimation, is 60 to 65%. The left ventricle has normal function. The left ventricle has no regional wall motion abnormalities. There is mild concentric left ventricular hypertrophy. Left ventricular diastolic parameters are consistent with Grade I diastolic dysfunction (impaired relaxation). The average left ventricular global longitudinal strain is -14.8 %. 2. Right ventricular systolic function is normal. The right ventricular size is normal. Tricuspid regurgitation signal is inadequate for assessing PA pressure. 3. Left atrial size was moderately dilated. 4. The mitral valve is degenerative. Mild  mitral valve regurgitation. No evidence of mitral stenosis. 5. The aortic valve has been repaired/replaced. Aortic valve regurgitation is not visualized. No aortic stenosis is present. Echo findings are consistent with normal structure and function of the aortic valve prosthesis. Aortic valve area, by VTI measures 1.41 cm. Aortic valve mean gradient measures 10.8 mmHg. Aortic valve Vmax measures 2.28 m/s. 6. The inferior vena cava is dilated in size with >50% respiratory variability, suggesting right atrial pressure of 8 mmHg.  FINDINGS Left Ventricle: Left ventricular ejection fraction, by estimation, is 60 to 65%. The left ventricle has normal function. The left ventricle has no regional wall motion abnormalities. The average left ventricular global longitudinal strain is -14.8 %.  Strain was performed and the global longitudinal strain is indeterminate. The left ventricular internal cavity size was normal in size. There is mild concentric left ventricular hypertrophy. Left ventricular diastolic parameters are consistent with Grade I diastolic dysfunction (impaired relaxation).  Right Ventricle: The right ventricular size is normal. No increase in right ventricular wall thickness. Right ventricular systolic function is normal. Tricuspid regurgitation signal is inadequate for assessing PA pressure.  Left Atrium: Left atrial size was moderately dilated.  Right Atrium: Right atrial size was normal in size.  Pericardium: There is no evidence of pericardial effusion.  Mitral Valve: The mitral valve is degenerative in appearance. Mild mitral valve regurgitation. No evidence of mitral valve stenosis.  Tricuspid Valve: The tricuspid valve is normal in structure. Tricuspid valve regurgitation is trivial. No evidence of tricuspid stenosis.  Aortic Valve: The aortic valve has been repaired/replaced. Aortic valve regurgitation is not visualized. No aortic stenosis is present. Aortic valve mean gradient  measures 10.8 mmHg. Aortic valve peak gradient measures 20.8 mmHg. Aortic valve area, by VTI measures 1.41 cm. Echo findings are consistent with normal structure and function of the aortic valve prosthesis.  Pulmonic Valve: The pulmonic valve was normal in structure. Pulmonic valve regurgitation is trivial. No evidence of pulmonic stenosis.  Aorta: The aortic root is normal in size and structure.  Venous: The inferior vena cava is dilated in size with greater than 50% respiratory variability, suggesting right atrial pressure of 8 mmHg.  IAS/Shunts: No atrial level shunt detected by color flow Doppler.   LEFT VENTRICLE PLAX 2D LVIDd:         4.62 cm   Diastology LVIDs:         3.03 cm   LV e' medial:    6.09 cm/s LV PW:         1.23 cm   LV E/e' medial:  14.1 LV IVS:        1.21 cm   LV e' lateral:   8.59 cm/s LVOT diam:     2.10 cm   LV E/e' lateral: 10.0 LV SV:         72 LV SV Index:   35        2D Longitudinal Strain LVOT Area:     3.46 cm  2D Strain GLS (A4C):   -15.0 % 2D Strain GLS (A3C):   -13.2 % 2D Strain GLS (A2C):   -16.0 % 2D Strain GLS Avg:     -14.8 %  RIGHT VENTRICLE             IVC RV Basal diam:  3.28 cm     IVC diam: 2.18 cm RV Mid diam:    2.44 cm RV S prime:     15.30 cm/s TAPSE (M-mode): 2.9 cm  LEFT ATRIUM             Index        RIGHT ATRIUM           Index LA diam:        3.80 cm 1.86 cm/m   RA Pressure: 8.00 mmHg LA Vol (A2C):   49.3 ml 24.08 ml/m  RA Area:     14.40 cm LA Vol (A4C):   62.0 ml 30.28 ml/m  RA Volume:   34.00 ml  16.61 ml/m LA Biplane Vol: 59.5 ml 29.06 ml/m AORTIC VALVE AV Area (Vmax):    1.41 cm AV Area (Vmean):   1.46 cm AV Area (VTI):     1.41  cm AV Vmax:           228.00 cm/s AV Vmean:          151.500 cm/s AV VTI:            0.510 m AV Peak Grad:      20.8 mmHg AV Mean Grad:      10.8 mmHg LVOT Vmax:         92.80 cm/s LVOT Vmean:        63.900 cm/s LVOT VTI:          0.208 m LVOT/AV VTI ratio:  0.41  AORTA Ao Root diam: 2.90 cm Ao Asc diam:  3.30 cm  MV E velocity: 85.90 cm/s   TRICUSPID VALVE MV A velocity: 118.00 cm/s  Estimated RAP:  8.00 mmHg MV E/A ratio:  0.73 SHUNTS Systemic VTI:  0.21 m Systemic Diam: 2.10 cm  Toribio Fuel MD Electronically signed by Toribio Fuel MD Signature Date/Time: 10/04/2023/11:15:46 PM    Final    MONITORS  LONG TERM MONITOR (3-14 DAYS) 04/21/2022  Narrative   Predominate rhythm is sinus rhythm.   1 run of nonsustained supraventricular tachycardia lasting 17 beats with max   Rare PVCs, PACs , rare couplets, bigeminy and  rare triplets   No serious arrhythmias observed   Patch Wear Time:  3 days and 4 hours (2024-02-07T14:21:27-0500 to 2024-02-10T18:46:03-498)  Patient had a min HR of 46 bpm, max HR of 141 bpm, and avg HR of 68 bpm. Predominant underlying rhythm was Sinus Rhythm. First Degree AV Block was present. 1 run of Supraventricular Tachycardia occurred lasting 17 beats with a max rate of 141 bpm (avg 108 bpm). Isolated SVEs were rare (<1.0%), SVE Couplets were rare (<1.0%), and SVE Triplets were rare (<1.0%). Isolated VEs were occasional (1.8%, 4952), VE Couplets were rare (<1.0%, 33), and no VE Triplets were present. Ventricular Bigeminy and Trigeminy were present.   CT SCANS  CT CORONARY MORPH W/CTA COR W/SCORE 07/14/2019  Addendum 07/14/2019  1:22 PM ADDENDUM REPORT: 07/14/2019 13:19  CLINICAL DATA:  Aortic stenosis  EXAM: Cardiac TAVR CT  TECHNIQUE: The patient was scanned on a Siemens Force 192 slice scanner. A 120 kV retrospective scan was triggered in the descending thoracic aorta at 111 HU's. Gantry rotation speed was 270 msecs and collimation was .9 mm. No beta blockade or nitro were given. The 3D data set was reconstructed in 5% intervals of the R-R cycle. Systolic and diastolic phases were analyzed on a dedicated work station using MPR, MIP and VRT modes. The patient received 80 cc of  contrast.  FINDINGS: Aortic Valve: Calcified tri leaflet with restricted leaflet motion  Aorta: Normal arch vessels no aneurysm no coarctation  Sinotubular Junction: 24 mm heavily calcified  Ascending Thoracic Aorta: 29 mm  Aortic Arch: 26 mm  Descending Thoracic Aorta: 23 mm  Sinus of Valsalva Measurements:  Non-coronary: 31 mm  Right - coronary: 28 mm  Left - coronary: 29.2 mm  Coronary Artery Height above Annulus:  Left Main: 13.4 mm above annulus  Right Coronary: 14.7 mm above annulus but occluded  Virtual Basal Annulus Measurements:  Maximum/Minimum Diameter: 19.5 mm x 28.9 mm  Perimeter: 81 mm  Area: 512 mm2  Coronary Arteries: Sufficient height above annulus for deployment RCA occluded fills by collaterals from left system  Optimum Fluoroscopic Angle for Delivery: LAO 3 Caudal 3 degrees  IMPRESSION: 1. Calcified tri leaflet AV with annular area of 512 mm 2 suitable for a 26 mm  Sapien 3 valve  2. Coronary arteries sufficient height above annulus for deployment Native RCA occluded  3.  Normal aortic root 2.9 cm with no aneurysm  4.  Optimum angiographic angle for deployment LAO 3 Caudal 3 degrees  5.  Significant calcification of the STJ  Maude Emmer   Electronically Signed By: Maude Emmer M.D. On: 07/14/2019 13:19  Narrative EXAM: OVER-READ INTERPRETATION  CT CHEST  The following report is an over-read performed by radiologist Dr. Toribio Aye of [facility] on 07/14/2019. This over-read does not include interpretation of cardiac or coronary anatomy or pathology. The coronary calcium  score/coronary CTA interpretation by the cardiologist is attached.  COMPARISON:  Cardiac CTA 06/28/2019.  FINDINGS: Extracardiac findings will be described separately on dictation for contemporaneously obtained CTA chest, abdomen and pelvis.  IMPRESSION: Please see separate dictation for contemporaneously obtained CTA chest, abdomen  and pelvis dated 07/14/2019 for full description of relevant extracardiac findings.  Electronically Signed: By: Toribio Aye M.D. On: 07/14/2019 12:03   CT SCANS  CT CORONARY FRACTIONAL FLOW RESERVE DATA PREP 06/28/2019  Narrative EXAM: FFRCT ANALYSIS: CT scan in 83 year old male with severe calcific plaque.  FINDINGS: FFRct analysis was performed on the original cardiac CT angiogram dataset. Diagrammatic representation of the FFRct analysis is provided in a separate PDF document in PACS. This dictation was created using the PDF document and an interactive 3D model of the results. 3D model is not available in the EMR/PACS. Normal FFR range is >0.80.  1. Left Main: 0.90  2. LAD: Proximal 0.90, mid 0.78, distal 0.68 - ABNORMAL  3. LCX: Proximal 0.87, mid 0.71, distal 0.57 - ABNORMAL  4. Ramus: n/a  5. RCA: Proximal 1.0, Mid OCCLUDED  - ABNORMAL  IMPRESSION: Severe three vessel coronary artery disease that is flow limiting in the mid LAD, mid Circumflex and totally occluded in mid RCA.  Recommend cardiac catheterization.  Oneil Parchment, MD [facility]  Note: These examples are not recommendations of [employer] and only provided as examples of what other customers are doing.   Electronically Signed By: Oneil Parchment MD On: 06/28/2019 18:18   CT CORONARY MORPH W/CTA COR W/SCORE 06/28/2019  Addendum 06/28/2019  2:35 PM ADDENDUM REPORT: 06/28/2019 14:32  EXAM: OVER-READ INTERPRETATION  CT CHEST  The following report is an over-read performed by radiologist Dr. Selinda Saas [facility] on 06/28/2019. This over-read does not include interpretation of cardiac or coronary anatomy or pathology. The coronary CTA interpretation by the cardiologist is attached.  COMPARISON:  11/11/2010 chest radiograph.  FINDINGS: Cardiovascular: Normal heart size. No significant pericardial effusion/thickening. Atherosclerotic nonaneurysmal thoracic aorta. Normal caliber  pulmonary arteries. No central pulmonary emboli.  Mediastinum/Nodes: Unremarkable esophagus. No pathologically enlarged mediastinal or hilar lymph nodes.  Lungs/Pleura: No pneumothorax. No pleural effusion. Calcified bilateral pulmonary granulomas, largest 1.3 cm in the medial right lower lobe. Solid 4 mm pulmonary nodule in the superior segment left lower lobe (series 13/image 1). No acute consolidative airspace disease, lung masses or additional significant pulmonary nodules.  Upper abdomen: No acute abnormality.  Musculoskeletal: No aggressive appearing focal osseous lesions. Mild thoracic spondylosis.  IMPRESSION: 1. Solitary 4 mm solid pulmonary nodule in the superior segment left lower lobe. No follow-up needed if patient is low-risk. Non-contrast chest CT can be considered in 12 months if patient is high-risk. This recommendation follows the consensus statement: Guidelines for Management of Incidental Pulmonary Nodules Detected on CT Images: From the Fleischner Society 2017; Radiology 2017; 284:228-243. 2.  Aortic  Atherosclerosis (ICD10-I70.0).   Electronically Signed By: Selinda DELENA Blue M.D. On: 06/28/2019 14:32  Narrative CLINICAL DATA:  83 year old with chest pain  EXAM: Cardiac/Coronary  CTA  TECHNIQUE: The patient was scanned on a [employer].  FINDINGS: A 110 kV prospective scan was triggered in the descending thoracic aorta at 111 HU's. Axial non-contrast 3 mm slices were carried out through the heart. The data set was analyzed on a dedicated work station and scored using the Agatson method. Gantry rotation speed was 250 msecs and collimation was .6 mm. Beta blockade and 0.8 mg of sl NTG was given. The 3D data set was reconstructed in 5% intervals of the 67-82 % of the R-R cycle. Diastolic phases were analyzed on a dedicated work station using MPR, MIP and VRT modes. The patient received 80 cc of contrast.  Aorta:  Normal size.  Scattered  calcifications.  No dissection.  Aortic Valve:  Trileaflet.  Moderate aortic valve calcifications.  Coronary Arteries:  Normal coronary origin.  Right dominance.  RCA is a large dominant artery that gives rise to PDA and PLA. There is severe calcified plaque, proximal predominance. Possible severe luminal disease (75-99%). Sending for FFR analysis.  Left main is a large artery that gives rise to LAD and LCX arteries. There is calcified plaque (appears non flow limiting 0-24%)  LAD is a large vessel that has severe diffuse calcified plaque. Possible severe stenosis proximally 75-99%. Sending for FFR. Possible severe distal calcified lesion 75-99% distally.  LCX is a non-dominant artery that gives rise to one large OM1 branch. There is severe calcified plaque that may be flow limiting 50-74% in the proximal region. Sending for FFR.  Other findings:  Normal pulmonary vein drainage into the left atrium.  Normal left atrial appendage without a thrombus.  Normal size of the pulmonary artery.  IMPRESSION: 1. Coronary calcium  score of 2633. This was 83 percentile for age and sex matched control.  2. Normal coronary origin with right dominance.  3.  Aortic atherosclerosis.  Moderate aortic valve sclerosis.  4. Severe multivessel calcified plaque, which may be flow limiting. Sending for FFR analysis.  Oneil Parchment, MD [facility]  Electronically Signed: By: Oneil Parchment MD On: 06/28/2019 13:48     ______________________________________________________________________________________________      Risk Assessment/Calculations          Physical Exam VS:  BP (!) 156/55   Pulse 76   Ht 5' 10 (1.778 m)   Wt 188 lb (85.3 kg)   SpO2 95%   BMI 26.98 kg/m        Wt Readings from Last 3 Encounters:  02/14/24 188 lb (85.3 kg)  12/30/23 187 lb 8 oz (85 kg)  11/23/23 188 lb 6.4 oz (85.5 kg)    GEN: Well nourished, well developed in no acute distress NECK: No JVD; No carotid  bruits CARDIAC: RRR, n2/6 systolic murmur RUSB, no rubs, gallops RESPIRATORY:  Clear to auscultation without rales, wheezing or rhonchi  ABDOMEN: Soft, non-tender, non-distended EXTREMITIES:  No edema; No deformity   ASSESSMENT AND PLAN  CAD: Last cardiac catheterization performed in May 2021 showed multivessel disease - mainly occluded RCA with collaterals, otherwise modest disease. I personally reviewed. Will continue antianginal therapy with Coreg , Imdur  and Ranexa . He has chest pain that is atypical. Not a candidate for intervention and based on prior angiogram in 2021 no real targets anyway  2. History of TAVR: Stable on last echocardiogram on 8//2025  3. Hypertension: Blood pressure  stable- on multiple agents.  4. Hyperlipidemia: On rosuvastatin  with LDL 50. Recommend stopping fenofibrate  since triglycerides have been normal for > 6 years  5. 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 Per oncology       Dispo: Follow-up in 4-80-month  Signed, Johnae Friley, MD

## 2024-02-14 ENCOUNTER — Ambulatory Visit: Attending: Cardiology | Admitting: Cardiology

## 2024-02-14 ENCOUNTER — Encounter: Payer: Self-pay | Admitting: Cardiology

## 2024-02-14 VITALS — BP 156/55 | HR 76 | Ht 70.0 in | Wt 188.0 lb

## 2024-02-14 DIAGNOSIS — I10 Essential (primary) hypertension: Secondary | ICD-10-CM | POA: Diagnosis not present

## 2024-02-14 DIAGNOSIS — I25119 Atherosclerotic heart disease of native coronary artery with unspecified angina pectoris: Secondary | ICD-10-CM

## 2024-02-14 DIAGNOSIS — E785 Hyperlipidemia, unspecified: Secondary | ICD-10-CM | POA: Diagnosis not present

## 2024-02-14 DIAGNOSIS — Z952 Presence of prosthetic heart valve: Secondary | ICD-10-CM | POA: Diagnosis not present

## 2024-02-14 NOTE — Patient Instructions (Addendum)
 Medication Instructions:  Stop Magnesium  Stop Fenofibrate  Continue all other medications *If you need a refill on your cardiac medications before your next appointment, please call your pharmacy*  Lab Work: Have Magnesium  done with next lab  Testing/Procedures: None ordered  Follow-Up: At [facility], you and your health needs are our priority.  As part of our continuing mission to provide you with exceptional heart care, our providers are all part of one team.  This team includes your primary Cardiologist (physician) and Advanced Practice Providers or APPs (Physician Assistants and Nurse Practitioners) who all work together to provide you with the care you need, when you need it.  Your next appointment:  4 to 5 months    Call in Feb to schedule April or May appointment     Provider:  Dr.Jordan    We recommend signing up for the patient portal called MyChart.  Sign up information is provided on this After Visit Summary.  MyChart is used to connect with patients for Virtual Visits (Telemedicine).  Patients are able to view lab/test results, encounter notes, upcoming appointments, etc.  Non-urgent messages can be sent to your provider as well.   To learn more about what you can do with MyChart, go to [email or web address].

## 2024-02-28 ENCOUNTER — Other Ambulatory Visit: Payer: Self-pay | Admitting: Cardiology

## 2024-02-29 MED ORDER — NITROGLYCERIN 0.4 MG SL SUBL: 0.4000 mg | SUBLINGUAL_TABLET | SUBLINGUAL | 11 refills | Status: AC | PRN

## 2024-03-07 ENCOUNTER — Encounter: Payer: Self-pay | Admitting: Nurse Practitioner

## 2024-03-07 ENCOUNTER — Inpatient Hospital Stay: Admitting: Nurse Practitioner

## 2024-03-07 VITALS — BP 143/53 | HR 71 | Temp 98.2°F | Resp 17 | Ht 70.0 in | Wt 187.0 lb

## 2024-03-07 DIAGNOSIS — Z515 Encounter for palliative care: Secondary | ICD-10-CM

## 2024-03-07 DIAGNOSIS — G893 Neoplasm related pain (acute) (chronic): Secondary | ICD-10-CM

## 2024-03-07 DIAGNOSIS — K5903 Drug induced constipation: Secondary | ICD-10-CM | POA: Diagnosis not present

## 2024-03-07 DIAGNOSIS — C649 Malignant neoplasm of unspecified kidney, except renal pelvis: Secondary | ICD-10-CM | POA: Diagnosis not present

## 2024-03-07 DIAGNOSIS — F515 Nightmare disorder: Secondary | ICD-10-CM

## 2024-03-07 DIAGNOSIS — G4709 Other insomnia: Secondary | ICD-10-CM

## 2024-03-07 DIAGNOSIS — G478 Other sleep disorders: Secondary | ICD-10-CM

## 2024-03-07 DIAGNOSIS — C7800 Secondary malignant neoplasm of unspecified lung: Secondary | ICD-10-CM

## 2024-03-07 NOTE — Progress Notes (Signed)
 "    Palliative Medicine [facility]  Telephone:(336) [phone] Fax:(336) [phone]   Name: Alexis Griffith Date: 03/07/2024 MRN: [id number]  DOB: May 13, 1940  Patient Care Team: Vernadine Charlie LELON, MD as PCP - General (Internal Medicine) Fernande Elspeth BROCKS, MD (Inactive) as PCP - Electrophysiology (Cardiology) Wonda Sharper, MD as PCP - Structural Heart (Cardiology) Jordan, Peter M, MD as PCP - Cardiology (Cardiology) Pickenpack-Cousar, Fannie SAILOR, NP as Nurse Practitioner (Hospice and Palliative Medicine)   INTERVAL HISTORY: Alexis Griffith is a 84 y.o. 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  Palliative is seeing patient for symptom management and goals of care.   SOCIAL HISTORY:     reports that he quit smoking about 48 years ago. His smoking use included cigarettes. He has never used smokeless tobacco. He reports that he does not drink alcohol  and does not use drugs.  ADVANCE DIRECTIVES:  Patient has documents. Encouraged to bring in a copy. Wife is clinical research associate.   CODE STATUS: DNR  PAST MEDICAL HISTORY: Past Medical History:  Diagnosis Date   Anxiety    Bladder neck obstruction 06/05/2013   CAD (coronary artery disease)    Carotid artery disease    CTA 08/2019 of the head and neck show moderate 65 to 75% bilateral ICA stenoses    Chronic diastolic CHF (congestive heart failure) (HCC)    Diabetes mellitus type 2 in nonobese (HCC)    Excessive urination at night 06/05/2013   Giant cell arteritis (HCC) 05/17/2012   Hyperlipidemia    Hypertension    Hypertensive urgency    Hypomagnesemia 09/11/2019   Mild anemia 09/11/2019   NSTEMI (non-ST elevated myocardial infarction) (HCC) 09/09/2019    Orthostatic hypotension 09/11/2019   Premature atrial contractions 09/12/2019   Pulmonary nodules    PVC (premature ventricular contraction)    Renal mass    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 Wonda & Dr. Lucas   Severe aortic stenosis    Sinus bradycardia on ECG    Sleep apnea    Squamous cell carcinoma of tongue [facility]) September 2012   Followed by Dr. Mable.   Stroke [facility])    TIA (transient ischemic attack)    Urinary retention 09/11/2019    ALLERGIES:  is allergic to macrodantin [nitrofurantoin], tape, sulfa antibiotics, bioflavonoid products, lisinopril, metformin  hcl, nutritional supplements, nutritional supplements, penicillins, pioglitazone, and sulfamethoxazole-trimethoprim.  MEDICATIONS:  Current Outpatient Medications  Medication Sig Dispense Refill   acetaminophen  (TYLENOL ) 325 MG tablet Take 650 mg by mouth every 6 (six) hours as needed for mild pain.     aspirin  81 MG chewable tablet Chew 1 tablet (81 mg total) by mouth daily. 360 tablet 0   carvedilol  (COREG ) 25 MG tablet Take 1 tablet (25 mg total) by mouth 2 (two) times daily. 180 tablet 3   clonazePAM  (KLONOPIN ) 1 MG tablet Take 1 tablet (1 mg total) by mouth 2 (two) times daily as needed for anxiety. 45 tablet 0   doxazosin  (CARDURA ) 4 MG tablet TAKE ONE TABLET BY MOUTH DAILY 90 tablet 3   FARXIGA 10 MG TABS tablet Take 10 mg by mouth daily.     glucose blood test strip 1  each by Other route as needed.      hydrALAZINE  (APRESOLINE ) 25 MG tablet Take 1 tablet (25 mg total) by mouth 3 (three) times daily. 90 tablet 3   insulin  glargine (LANTUS ) 100 UNIT/ML injection Inject 70 Units into the skin daily.     irbesartan  (AVAPRO ) 300 MG tablet Take 1 tablet (300 mg total) by mouth daily. 30 tablet 11   isosorbide  mononitrate (IMDUR ) 120 MG 24 hr tablet Take 1 tablet (120 mg total) by mouth daily. 90 tablet 3   Lancets (ONETOUCH DELICA  PLUS LANCET33G) MISC 1 each by Other route daily.      nitroGLYCERIN  (NITROSTAT ) 0.4 MG SL tablet Place 1 tablet (0.4 mg total) under the tongue every 5 (five) minutes as needed for chest pain. 25 tablet 11   ondansetron  (ZOFRAN ) 4 MG tablet Take 1 tablet (4 mg total) by mouth every 8 (eight) hours as needed for nausea or vomiting. 30 tablet 0   ONE TOUCH ULTRA TEST test strip 1 each by Other route daily.      ranolazine  (RANEXA ) 500 MG 12 hr tablet Take 1 tablet (500 mg total) by mouth 2 (two) times daily. 180 tablet 3   rosuvastatin  (CRESTOR ) 10 MG tablet Take 1 tablet (10 mg total) by mouth daily. 90 tablet 3   senna-docusate (SENNA S) 8.6-50 MG tablet Take 2 tablets by mouth at bedtime as needed for mild constipation. 60 tablet 3   traMADol  (ULTRAM ) 50 MG tablet Take 1 tablet (50 mg total) by mouth every 8 (eight) hours as needed. 60 tablet 0   No current facility-administered medications for this visit.    VITAL SIGNS: BP (!) 143/53 (BP Location: Left Arm, Patient Position: Sitting) Comment: nurse is aware  Pulse 71   Temp 98.2 F (36.8 C) (Temporal)   Resp 17   Ht 5' 10 (1.778 m)   Wt 187 lb (84.8 kg)   SpO2 95%   BMI 26.83 kg/m  Filed Weights   03/07/24 1308  Weight: 187 lb (84.8 kg)     Estimated body mass index is 26.83 kg/m as calculated from the following:   Height as of this encounter: 5' 10 (1.778 m).   Weight as of this encounter: 187 lb (84.8 kg).  PERFORMANCE STATUS (ECOG) : 1 - Symptomatic but completely ambulatory  Assessment NAD RRR Normal breathing pattern AAO x3  IMPRESSION: Discussed the use of AI scribe software for clinical note transcription with the patient, who gave verbal consent to proceed.  History of Present Illness Alexis Griffith is an 84 year old male with metastatic renal cell carcinoma who presented to clinic for symptom management follow-up. No acute distress. He is accompanied by his wife and daughter.  Denies concerns of  uncontrolled nausea, vomiting, constipation, or diarrhea. His appetite is fair. Some days are better than others.   He has been experiencing constipation for almost two weeks. Initially, he took one Senokot tablet daily, but later realized the instructions were for two tablets in the morning and two at night. Despite this, he has only had one bowel movement. He is eating normally but is concerned about the lack of bowel movements. He attempted to induce vomiting but was unsuccessful and has not experienced nausea or vomiting. He mentions taking Emetrol and Pepto Bismol, which he believes contribute to his constipation. Extensive education provided on constipation and bowel regimen. Patient instructed to take half bottle of magnesium  citrate and proceed with the other half 4 hours later  if no relief followed by routine use of Senna-S 2 tablets twice daily. He and family verbalized understanding.   He reports significant sleep disturbances, stating he did not sleep at all the previous night, going to bed at 9 PM and getting up at 4 AM without any sleep in between. He has a history of sleep problems, which seem to have worsened recently. He experiences nightmares. This has been ongoing for years however recent insomnia has been significant.  He currently takes Klonopin , 1 mg once a day. Encouraged sleep hygiene with ability to take Klonopin  at bedtime.   He experiences chest pain at night, which he describes as severe. He has used nitroglycerin  without relief. Cardiologist has informed him that his discomfort is not cardiac related. Education provided on cancer related pain. He has not taken tramadol  in a week, and when he did, he only took one tablet, which he reports did not seem to help him at all leading to no further use.   Education provided on pain management and maximizing medications. I have instructed him to restarted Tramadol  50-100mg  every 4 hours as needed. He verbalized understanding.   We will  continue to closely monitor. All questions and support provided.   Assessment & Plan Cancer related pain.  Chronic pain syndrome persists but is not severe enough to necessitate regular tramadol  use. Tramadol  has been ineffective when used. He is accustomed to the pain and has multiple other health concerns. - Advise tramadol  as needed for severe pain, with the option to take one or two tablets if pain becomes intolerable every 4 hours.  - Monitor pain levels and adjust tramadol  dosage as needed.  Nightmares and sleep disturbance Chronic nightmares occurring nightly, potentially exacerbated by reduction in Klonopin  dosage from 4 mg to 1 mg daily. Nightmares may also be related to past surgery. Klonopin  aids sleep at higher doses. - Continue Klonopin  to 1 mg twice daily, with a new prescription for 1 mg tablets. - Monitor effectiveness of increased Klonopin  dosage on nightmares and sleep quality.  Constipation Chronic constipation for almost two weeks, likely exacerbated by medications such as Imitrex and pantoprazole, which can cause constipation by drying up secretions. Senokot has been ineffective at the current dosage. - Instructed to take two Senokot tablets twice a day. - Instructed to obtain a bottle of magnesium  citrate and drink half the bottle. If no bowel movement in four hours, drink the other half. - Advised to avoid taking magnesium  citrate at bedtime to prevent nocturia.  I will plan to see patient back in 1 week by phone. Sooner if needed.   Patient expressed understanding and was in agreement with this plan. He also understands that He can call the clinic at any time with any questions, concerns, or complaints.   Any controlled substances utilized were prescribed in the context of palliative care. PDMP has been reviewed.   I personally spent a total of 45 minutes in the care of the patient today including preparing to see the patient, getting/reviewing separately obtained  history, performing a medically appropriate exam/evaluation, counseling and educating, placing orders, documenting clinical information in the EHR, and coordinating care.  Visit consisted of counseling and education dealing with the complex and emotionally intense issues of symptom management and palliative care in the setting of serious and potentially life-threatening illness.  Levon Borer, AGPCNP-BC  Palliative Medicine Team/[facility]    "

## 2024-03-14 ENCOUNTER — Encounter: Payer: Self-pay | Admitting: Nurse Practitioner

## 2024-03-14 ENCOUNTER — Other Ambulatory Visit: Payer: Self-pay | Admitting: Physician Assistant

## 2024-03-14 ENCOUNTER — Inpatient Hospital Stay: Admitting: Nurse Practitioner

## 2024-03-17 ENCOUNTER — Other Ambulatory Visit: Payer: Self-pay | Admitting: Nurse Practitioner

## 2024-03-17 DIAGNOSIS — C7800 Secondary malignant neoplasm of unspecified lung: Secondary | ICD-10-CM

## 2024-03-17 DIAGNOSIS — F419 Anxiety disorder, unspecified: Secondary | ICD-10-CM

## 2024-03-27 ENCOUNTER — Inpatient Hospital Stay: Admitting: Nurse Practitioner

## 2024-03-27 ENCOUNTER — Encounter: Payer: Self-pay | Admitting: Nurse Practitioner

## 2024-03-27 DIAGNOSIS — B3749 Other urogenital candidiasis: Secondary | ICD-10-CM | POA: Diagnosis not present

## 2024-03-27 DIAGNOSIS — C7800 Secondary malignant neoplasm of unspecified lung: Secondary | ICD-10-CM

## 2024-03-27 DIAGNOSIS — F419 Anxiety disorder, unspecified: Secondary | ICD-10-CM | POA: Diagnosis not present

## 2024-03-27 DIAGNOSIS — Z515 Encounter for palliative care: Secondary | ICD-10-CM | POA: Diagnosis not present

## 2024-03-27 DIAGNOSIS — K5900 Constipation, unspecified: Secondary | ICD-10-CM | POA: Diagnosis not present

## 2024-03-27 DIAGNOSIS — C649 Malignant neoplasm of unspecified kidney, except renal pelvis: Secondary | ICD-10-CM

## 2024-03-27 DIAGNOSIS — Z87891 Personal history of nicotine dependence: Secondary | ICD-10-CM | POA: Diagnosis not present

## 2024-03-27 DIAGNOSIS — G893 Neoplasm related pain (acute) (chronic): Secondary | ICD-10-CM

## 2024-03-27 DIAGNOSIS — K5901 Slow transit constipation: Secondary | ICD-10-CM

## 2024-03-27 DIAGNOSIS — R530 Neoplastic (malignant) related fatigue: Secondary | ICD-10-CM

## 2024-03-27 DIAGNOSIS — G4709 Other insomnia: Secondary | ICD-10-CM

## 2024-03-27 NOTE — Progress Notes (Signed)
 "    Palliative Medicine [facility]  Telephone:(336) [phone] Fax:(336) [phone]   Name: Alexis Griffith Date: 03/27/2024 MRN: [id number]  DOB: [birth year]  Patient Care Team: Vernadine Charlie LELON, MD as PCP - General (Internal Medicine) Fernande Elspeth BROCKS, MD (Inactive) as PCP - Electrophysiology (Cardiology) Wonda Sharper, MD as PCP - Structural Heart (Cardiology) Jordan, Peter M, MD as PCP - Cardiology (Cardiology) Pickenpack-Cousar, Fannie SAILOR, NP as Nurse Practitioner [facility] and Palliative Medicine)   I connected with Aleene LELON Bare on 03/27/24 at  4:15 PM EST by telephone and verified that I am speaking with the correct person using two identifiers.   I discussed the limitations, risks, security and privacy concerns of performing an evaluation and management service by telemedicine and the availability of in-person appointments. I also discussed with the patient that there may be a patient responsible charge related to this service. The patient expressed understanding and agreed to proceed.   Other persons participating in the visit and their role in the encounter: N/A   Patients location: Home   Providers location: [facility]    INTERVAL HISTORY: Alexis Griffith is a 84 y.o. 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  Palliative is seeing patient for symptom management and goals of care.   SOCIAL HISTORY:     reports that he quit smoking about 48 years ago. His smoking use included cigarettes. He has never used smokeless tobacco. He reports that he does not drink alcohol  and does not use drugs.  ADVANCE DIRECTIVES:  Patient has documents. Encouraged to bring in a copy. Wife is clinical research associate.    CODE STATUS: DNR  PAST MEDICAL HISTORY: Past Medical History:  Diagnosis Date   Anxiety    Bladder neck obstruction 06/05/2013   CAD (coronary artery disease)    Carotid artery disease    CTA 08/2019 of the head and neck show moderate 65 to 75% bilateral ICA stenoses    Chronic diastolic CHF (congestive heart failure) (HCC)    Diabetes mellitus type 2 in nonobese (HCC)    Excessive urination at night 06/05/2013   Giant cell arteritis (HCC) 05/17/2012   Hyperlipidemia    Hypertension    Hypertensive urgency    Hypomagnesemia 09/11/2019   Mild anemia 09/11/2019   NSTEMI (non-ST elevated myocardial infarction) (HCC) 09/09/2019   Orthostatic hypotension 09/11/2019   Premature atrial contractions 09/12/2019   Pulmonary nodules    PVC (premature ventricular contraction)    Renal mass    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 Wonda & Dr. Lucas   Severe aortic stenosis    Sinus bradycardia on ECG    Sleep apnea    Squamous cell carcinoma of tongue [facility]) September 2012   Followed by Dr. Mable.   Stroke [facility])    TIA (transient ischemic attack)    Urinary retention 09/11/2019    ALLERGIES:  is allergic to macrodantin [nitrofurantoin], tape, sulfa antibiotics, bioflavonoid products, lisinopril, metformin  hcl, nutritional supplements, nutritional supplements, penicillins, pioglitazone, and sulfamethoxazole-trimethoprim.  MEDICATIONS:  Current Outpatient Medications  Medication Sig Dispense Refill   acetaminophen  (TYLENOL ) 325 MG tablet Take 650 mg by mouth every 6 (six) hours as needed for mild  pain.     aspirin  81 MG chewable tablet Chew 1 tablet (81 mg total) by mouth daily. 360 tablet 0   carvedilol  (COREG ) 25 MG tablet Take 1 tablet (25 mg total) by mouth 2 (two) times daily. 180 tablet 3   clonazePAM  (KLONOPIN ) 1 MG tablet Take 1 tablet (1 mg total) by mouth 2 (two) times daily as needed for anxiety. 45  tablet 0   doxazosin  (CARDURA ) 4 MG tablet TAKE ONE TABLET BY MOUTH DAILY 90 tablet 3   FARXIGA 10 MG TABS tablet Take 10 mg by mouth daily.     glucose blood test strip 1 each by Other route as needed.      hydrALAZINE  (APRESOLINE ) 25 MG tablet Take 1 tablet (25 mg total) by mouth 3 (three) times daily. 270 tablet 3   insulin  glargine (LANTUS ) 100 UNIT/ML injection Inject 70 Units into the skin daily.     irbesartan  (AVAPRO ) 300 MG tablet Take 1 tablet (300 mg total) by mouth daily. 30 tablet 11   isosorbide  mononitrate (IMDUR ) 120 MG 24 hr tablet Take 1 tablet (120 mg total) by mouth daily. 90 tablet 3   Lancets (ONETOUCH DELICA PLUS LANCET33G) MISC 1 each by Other route daily.      nitroGLYCERIN  (NITROSTAT ) 0.4 MG SL tablet Place 1 tablet (0.4 mg total) under the tongue every 5 (five) minutes as needed for chest pain. 25 tablet 11   ondansetron  (ZOFRAN ) 4 MG tablet Take 1 tablet (4 mg total) by mouth every 8 (eight) hours as needed for nausea or vomiting. 30 tablet 0   ONE TOUCH ULTRA TEST test strip 1 each by Other route daily.      ranolazine  (RANEXA ) 500 MG 12 hr tablet Take 1 tablet (500 mg total) by mouth 2 (two) times daily. 180 tablet 3   rosuvastatin  (CRESTOR ) 10 MG tablet Take 1 tablet (10 mg total) by mouth daily. 90 tablet 3   senna-docusate (SENNA S) 8.6-50 MG tablet Take 2 tablets by mouth at bedtime as needed for mild constipation. 60 tablet 3   traMADol  (ULTRAM ) 50 MG tablet Take 1 tablet (50 mg total) by mouth every 8 (eight) hours as needed. 60 tablet 0   No current facility-administered medications for this visit.    VITAL SIGNS: There were no vitals taken for this visit. There were no vitals filed for this visit.    Estimated body mass index is 26.83 kg/m as calculated from the following:   Height as of 03/07/24: 5' 10 (1.778 m).   Weight as of 03/07/24: 187 lb (84.8 kg).  PERFORMANCE STATUS (ECOG) : 1 - Symptomatic but completely  ambulatory   IMPRESSION: Discussed the use of AI scribe software for clinical note transcription with the patient, who gave verbal consent to proceed.  History of Present Illness Alexis Griffith is an 84 year old male with metastatic renal cell carcinoma who I connected with by phone for symptom management follow-up. No nausea, vomiting, diarrhea, and reports good appetite. Occasional fatigue and at times find himself taking a nap during the daytime.   Occasional constipation which he reports is improving. He is taking Senna-S one tablet at bedtime. I encouraged him to increase to two tablets for better bowel pattern. He verbalized understanding.   Mr. Hollenbeck reports currently his biggest concern is a red itchy rash in his groin. He is experiencing a yeast rash, which he attributes to his diabetes medication, Farxiga. Symptoms include burning and itching, causing significant discomfort. States he  has an appointment scheduled with his PCP tomorrow for further evaluation and discussions. He expresses no concerns on Januvia  however pharmacy identified possible rash related to Farxiga. I encouraged him to discuss with PCP with plans most likely to treat current symptoms and allow time.   He has frequent urination, waking up two to three times a night to use the bathroom, which he finds disruptive to his sleep. He is currently taking Klonopin , which helps him sleep better, allowing him to sleep two to three hours continuously at times. He takes one mg at night does not typically require during the day however when feelings of increased anxiety which does not occur often, he has available. Tolerating without difficulty.   we discussed his pain at length. Mr. Kuhner experiences generalized pain, particularly in the morning, which he attributes to arthritis. The pain improves with movement. He occasionally takes Tramadol  for pain relief, however, does not require daily use. reports it has been over a week since  last dose.   No uncontrolled palliative symptoms at this time. All questions answered and support provided.   Assessment & Plan  Genitourinary candidiasis Likely secondary to Farxiga use, presenting with burning and itching. He prefers to discontinue Farxiga due to symptoms. Encouraged further discussions and possible symptom management with PCP.  - Patient reports upcoming appointment tomorrow with PCP regarding treatment options for genitourinary candidiasis. - Will consider switching back to Januvia  if symptoms persist or recur after treatment.  Constipation Improved with Senokot 1 tablet at bedtime. - Increase Senokot to 2 tablets at bedtime for better bowel regimen and management.  Generalized anxiety disorder/Nightmares Managed with Klonopin  1 mg at night, which aids sleep. Currently not requiring twice daily use. Tolerating without difficulty or concerns.  - Continue Klonopin  1 mg twice daily as needed.   Follow-Up I will plan to follow-up by phone in 1-2 weeks, in-person in March in collaboration to Oncology visits.  I will plan to see patient back in 1 week by phone. Sooner if needed.   Patient expressed understanding and was in agreement with this plan. He also understands that He can call the clinic at any time with any questions, concerns, or complaints.   Any controlled substances utilized were prescribed in the context of palliative care. PDMP has been reviewed.   I provided 45 minutes of non face-to-face telephone visit time during this encounter, and > 50% was spent counseling as documented under my assessment & plan.  Visit consisted of counseling and education dealing with the complex and emotionally intense issues of symptom management and palliative care in the setting of serious and potentially life-threatening illness.  Levon Borer, AGPCNP-BC  Palliative Medicine Team/[facility]    "

## 2024-04-11 ENCOUNTER — Inpatient Hospital Stay

## 2024-05-01 ENCOUNTER — Inpatient Hospital Stay

## 2024-05-08 ENCOUNTER — Inpatient Hospital Stay
